# Patient Record
Sex: Male | Born: 1945
Health system: Southern US, Community
[De-identification: ages and names within clinical notes are randomized; demographics above are authoritative.]

## PROBLEM LIST (undated history)

## (undated) DIAGNOSIS — S72002A Fracture of unspecified part of neck of left femur, initial encounter for closed fracture: Secondary | ICD-10-CM

## (undated) DIAGNOSIS — E1122 Type 2 diabetes mellitus with diabetic chronic kidney disease: Secondary | ICD-10-CM

## (undated) DIAGNOSIS — E1149 Type 2 diabetes mellitus with other diabetic neurological complication: Secondary | ICD-10-CM

## (undated) DIAGNOSIS — R351 Nocturia: Secondary | ICD-10-CM

## (undated) DIAGNOSIS — E1139 Type 2 diabetes mellitus with other diabetic ophthalmic complication: Secondary | ICD-10-CM

## (undated) DIAGNOSIS — I429 Cardiomyopathy, unspecified: Secondary | ICD-10-CM

## (undated) DIAGNOSIS — M1990 Unspecified osteoarthritis, unspecified site: Secondary | ICD-10-CM

## (undated) DIAGNOSIS — D509 Iron deficiency anemia, unspecified: Secondary | ICD-10-CM

## (undated) DIAGNOSIS — Z89611 Acquired absence of right leg above knee: Secondary | ICD-10-CM

## (undated) DIAGNOSIS — M159 Polyosteoarthritis, unspecified: Secondary | ICD-10-CM

## (undated) DIAGNOSIS — N401 Enlarged prostate with lower urinary tract symptoms: Secondary | ICD-10-CM

## (undated) DIAGNOSIS — E1151 Type 2 diabetes mellitus with diabetic peripheral angiopathy without gangrene: Secondary | ICD-10-CM

## (undated) DIAGNOSIS — E669 Obesity, unspecified: Secondary | ICD-10-CM

## (undated) DIAGNOSIS — I2510 Atherosclerotic heart disease of native coronary artery without angina pectoris: Secondary | ICD-10-CM

## (undated) DIAGNOSIS — K759 Inflammatory liver disease, unspecified: Secondary | ICD-10-CM

## (undated) DIAGNOSIS — N400 Enlarged prostate without lower urinary tract symptoms: Secondary | ICD-10-CM

## (undated) DIAGNOSIS — K644 Residual hemorrhoidal skin tags: Secondary | ICD-10-CM

## (undated) DIAGNOSIS — E039 Hypothyroidism, unspecified: Secondary | ICD-10-CM

## (undated) DIAGNOSIS — R972 Elevated prostate specific antigen [PSA]: Secondary | ICD-10-CM

## (undated) DIAGNOSIS — E113493 Type 2 diabetes mellitus with severe nonproliferative diabetic retinopathy without macular edema, bilateral: Secondary | ICD-10-CM

## (undated) DIAGNOSIS — E785 Hyperlipidemia, unspecified: Secondary | ICD-10-CM

## (undated) DIAGNOSIS — I10 Essential (primary) hypertension: Secondary | ICD-10-CM

## (undated) DIAGNOSIS — Z992 Dependence on renal dialysis: Secondary | ICD-10-CM

## (undated) DIAGNOSIS — N186 End stage renal disease: Secondary | ICD-10-CM

## (undated) DIAGNOSIS — I119 Hypertensive heart disease without heart failure: Secondary | ICD-10-CM

## (undated) DIAGNOSIS — K648 Other hemorrhoids: Secondary | ICD-10-CM

## (undated) DIAGNOSIS — K5900 Constipation, unspecified: Secondary | ICD-10-CM

## (undated) DIAGNOSIS — N185 Chronic kidney disease, stage 5: Secondary | ICD-10-CM

## (undated) DIAGNOSIS — G546 Phantom limb syndrome with pain: Principal | ICD-10-CM

## (undated) DIAGNOSIS — I4892 Unspecified atrial flutter: Secondary | ICD-10-CM

## (undated) DIAGNOSIS — K219 Gastro-esophageal reflux disease without esophagitis: Secondary | ICD-10-CM

## (undated) DIAGNOSIS — I872 Venous insufficiency (chronic) (peripheral): Secondary | ICD-10-CM

## (undated) DIAGNOSIS — Z9289 Personal history of other medical treatment: Secondary | ICD-10-CM

## (undated) HISTORY — DX: Hypertensive heart disease without heart failure: I11.9

## (undated) HISTORY — DX: Type 2 diabetes mellitus with diabetic peripheral angiopathy without gangrene: E11.51

## (undated) HISTORY — PX: REFRACTIVE SURGERY: SHX103

## (undated) HISTORY — DX: Type 2 diabetes mellitus with other diabetic neurological complication: E11.49

## (undated) HISTORY — PX: LEG AMPUTATION ABOVE KNEE: SHX117

## (undated) HISTORY — DX: Fracture of unspecified part of neck of left femur, initial encounter for closed fracture: S72.002A

## (undated) HISTORY — DX: Benign prostatic hyperplasia with lower urinary tract symptoms: N40.1

## (undated) HISTORY — DX: Cardiomyopathy, unspecified: I42.9

## (undated) HISTORY — DX: Residual hemorrhoidal skin tags: K64.4

## (undated) HISTORY — DX: Acquired absence of right leg above knee: Z89.611

## (undated) HISTORY — DX: End stage renal disease: Z99.2

## (undated) HISTORY — DX: Iron deficiency anemia, unspecified: D50.9

## (undated) HISTORY — DX: Constipation, unspecified: K59.00

## (undated) HISTORY — DX: Venous insufficiency (chronic) (peripheral): I87.2

## (undated) HISTORY — DX: End stage renal disease: N18.6

## (undated) HISTORY — DX: Nocturia: R35.1

## (undated) HISTORY — DX: Benign prostatic hyperplasia without lower urinary tract symptoms: N40.0

## (undated) HISTORY — DX: Type 2 diabetes mellitus with severe nonproliferative diabetic retinopathy without macular edema, bilateral: E11.3493

## (undated) HISTORY — DX: Elevated prostate specific antigen (PSA): R97.20

## (undated) HISTORY — DX: Essential (primary) hypertension: I10

## (undated) HISTORY — DX: Other hemorrhoids: K64.8

## (undated) HISTORY — DX: Type 2 diabetes mellitus with other diabetic ophthalmic complication: E11.39

## (undated) HISTORY — DX: Chronic kidney disease, stage 5: N18.5

## (undated) HISTORY — PX: JOINT REPLACEMENT: SHX530

## (undated) HISTORY — DX: Unspecified osteoarthritis, unspecified site: M19.90

## (undated) HISTORY — PX: PROSTATE BIOPSY: SHX241

## (undated) HISTORY — DX: Unspecified atrial flutter: I48.92

## (undated) HISTORY — DX: Hyperlipidemia, unspecified: E78.5

## (undated) HISTORY — PX: TONSILLECTOMY: SUR1361

## (undated) HISTORY — DX: Phantom limb syndrome with pain: G54.6

## (undated) HISTORY — PX: FRACTURE SURGERY: SHX138

## (undated) HISTORY — DX: Type 2 diabetes mellitus with diabetic chronic kidney disease: E11.22

## (undated) HISTORY — DX: Polyosteoarthritis, unspecified: M15.9

## (undated) HISTORY — DX: Atherosclerotic heart disease of native coronary artery without angina pectoris: I25.10

## (undated) HISTORY — DX: Obesity, unspecified: E66.9

## (undated) HISTORY — PX: CORONARY ANGIOPLASTY WITH STENT PLACEMENT: SHX49

---

## 1988-11-22 HISTORY — PX: PILONIDAL CYST EXCISION: SHX744

## 1997-09-26 ENCOUNTER — Other Ambulatory Visit: Admission: RE | Admit: 1997-09-26 | Discharge: 1997-09-26 | Payer: Self-pay | Admitting: Family Medicine

## 1998-05-04 ENCOUNTER — Encounter: Payer: Self-pay | Admitting: Cardiology

## 1998-05-04 ENCOUNTER — Ambulatory Visit (HOSPITAL_COMMUNITY): Admission: RE | Admit: 1998-05-04 | Discharge: 1998-05-04 | Payer: Self-pay | Admitting: Cardiology

## 1999-05-27 ENCOUNTER — Encounter: Payer: Self-pay | Admitting: Emergency Medicine

## 1999-05-27 ENCOUNTER — Inpatient Hospital Stay (HOSPITAL_COMMUNITY): Admission: EM | Admit: 1999-05-27 | Discharge: 1999-06-02 | Payer: Self-pay | Admitting: Emergency Medicine

## 2001-05-29 ENCOUNTER — Emergency Department (HOSPITAL_COMMUNITY): Admission: EM | Admit: 2001-05-29 | Discharge: 2001-05-29 | Payer: Self-pay | Admitting: Emergency Medicine

## 2001-05-29 ENCOUNTER — Encounter: Payer: Self-pay | Admitting: Emergency Medicine

## 2001-06-03 ENCOUNTER — Emergency Department (HOSPITAL_COMMUNITY): Admission: EM | Admit: 2001-06-03 | Discharge: 2001-06-03 | Payer: Self-pay | Admitting: Emergency Medicine

## 2001-10-12 ENCOUNTER — Encounter: Payer: Self-pay | Admitting: Emergency Medicine

## 2001-10-12 ENCOUNTER — Emergency Department (HOSPITAL_COMMUNITY): Admission: EM | Admit: 2001-10-12 | Discharge: 2001-10-12 | Payer: Self-pay | Admitting: Emergency Medicine

## 2002-12-07 ENCOUNTER — Ambulatory Visit (HOSPITAL_COMMUNITY): Admission: RE | Admit: 2002-12-07 | Discharge: 2002-12-07 | Payer: Self-pay | Admitting: Specialist

## 2002-12-28 ENCOUNTER — Ambulatory Visit (HOSPITAL_COMMUNITY): Admission: RE | Admit: 2002-12-28 | Discharge: 2002-12-28 | Payer: Self-pay | Admitting: Specialist

## 2003-08-01 ENCOUNTER — Inpatient Hospital Stay (HOSPITAL_COMMUNITY): Admission: EM | Admit: 2003-08-01 | Discharge: 2003-08-09 | Payer: Self-pay | Admitting: Emergency Medicine

## 2003-12-08 ENCOUNTER — Ambulatory Visit: Payer: Self-pay | Admitting: Internal Medicine

## 2003-12-18 ENCOUNTER — Ambulatory Visit: Payer: Self-pay | Admitting: Internal Medicine

## 2003-12-21 ENCOUNTER — Ambulatory Visit (HOSPITAL_COMMUNITY): Admission: RE | Admit: 2003-12-21 | Discharge: 2003-12-21 | Payer: Self-pay | Admitting: Vascular Surgery

## 2004-01-23 ENCOUNTER — Ambulatory Visit: Payer: Self-pay | Admitting: Internal Medicine

## 2004-04-23 ENCOUNTER — Ambulatory Visit: Payer: Self-pay | Admitting: Internal Medicine

## 2004-04-23 ENCOUNTER — Inpatient Hospital Stay (HOSPITAL_COMMUNITY): Admission: AD | Admit: 2004-04-23 | Discharge: 2004-04-26 | Payer: Self-pay | Admitting: Internal Medicine

## 2004-04-23 ENCOUNTER — Ambulatory Visit: Payer: Self-pay | Admitting: Cardiovascular Disease

## 2004-04-24 ENCOUNTER — Encounter: Payer: Self-pay | Admitting: Cardiology

## 2004-05-06 ENCOUNTER — Ambulatory Visit: Payer: Self-pay | Admitting: Internal Medicine

## 2004-06-05 ENCOUNTER — Ambulatory Visit: Payer: Self-pay | Admitting: Cardiology

## 2004-06-24 ENCOUNTER — Ambulatory Visit: Payer: Self-pay | Admitting: Internal Medicine

## 2004-07-04 ENCOUNTER — Ambulatory Visit: Payer: Self-pay | Admitting: Internal Medicine

## 2004-08-30 ENCOUNTER — Ambulatory Visit: Payer: Self-pay | Admitting: Internal Medicine

## 2004-09-06 ENCOUNTER — Ambulatory Visit: Payer: Self-pay | Admitting: Internal Medicine

## 2004-10-10 ENCOUNTER — Ambulatory Visit: Payer: Self-pay | Admitting: Cardiology

## 2004-10-17 ENCOUNTER — Ambulatory Visit: Payer: Self-pay | Admitting: Internal Medicine

## 2004-10-25 ENCOUNTER — Ambulatory Visit: Payer: Self-pay | Admitting: Cardiology

## 2004-10-25 ENCOUNTER — Ambulatory Visit: Payer: Self-pay

## 2004-11-27 ENCOUNTER — Ambulatory Visit: Payer: Self-pay | Admitting: Internal Medicine

## 2004-12-04 ENCOUNTER — Ambulatory Visit: Payer: Self-pay | Admitting: Internal Medicine

## 2004-12-05 ENCOUNTER — Inpatient Hospital Stay (HOSPITAL_COMMUNITY): Admission: RE | Admit: 2004-12-05 | Discharge: 2004-12-09 | Payer: Self-pay | Admitting: Orthopedic Surgery

## 2004-12-05 ENCOUNTER — Encounter (INDEPENDENT_AMBULATORY_CARE_PROVIDER_SITE_OTHER): Payer: Self-pay | Admitting: Specialist

## 2005-01-10 ENCOUNTER — Ambulatory Visit (HOSPITAL_COMMUNITY): Admission: RE | Admit: 2005-01-10 | Discharge: 2005-01-10 | Payer: Self-pay | Admitting: Orthopedic Surgery

## 2005-01-24 ENCOUNTER — Inpatient Hospital Stay (HOSPITAL_COMMUNITY): Admission: RE | Admit: 2005-01-24 | Discharge: 2005-01-27 | Payer: Self-pay | Admitting: Orthopedic Surgery

## 2005-01-24 ENCOUNTER — Encounter (INDEPENDENT_AMBULATORY_CARE_PROVIDER_SITE_OTHER): Payer: Self-pay | Admitting: *Deleted

## 2005-04-14 ENCOUNTER — Encounter: Admission: RE | Admit: 2005-04-14 | Discharge: 2005-07-13 | Payer: Self-pay | Admitting: Orthopedic Surgery

## 2005-04-16 ENCOUNTER — Ambulatory Visit: Payer: Self-pay | Admitting: Internal Medicine

## 2005-04-28 ENCOUNTER — Ambulatory Visit: Payer: Self-pay | Admitting: Internal Medicine

## 2005-05-23 ENCOUNTER — Ambulatory Visit: Payer: Self-pay | Admitting: Cardiology

## 2005-05-30 ENCOUNTER — Ambulatory Visit: Payer: Self-pay | Admitting: Cardiology

## 2005-06-11 ENCOUNTER — Ambulatory Visit: Payer: Self-pay | Admitting: Internal Medicine

## 2005-07-14 ENCOUNTER — Encounter: Admission: RE | Admit: 2005-07-14 | Discharge: 2005-08-07 | Payer: Self-pay | Admitting: Orthopedic Surgery

## 2005-11-28 ENCOUNTER — Ambulatory Visit: Payer: Self-pay | Admitting: Cardiology

## 2005-12-24 ENCOUNTER — Ambulatory Visit: Payer: Self-pay | Admitting: Internal Medicine

## 2005-12-24 ENCOUNTER — Ambulatory Visit (HOSPITAL_COMMUNITY): Admission: RE | Admit: 2005-12-24 | Discharge: 2005-12-24 | Payer: Self-pay | Admitting: Internal Medicine

## 2006-01-19 ENCOUNTER — Ambulatory Visit: Payer: Self-pay | Admitting: Internal Medicine

## 2006-01-19 ENCOUNTER — Encounter (INDEPENDENT_AMBULATORY_CARE_PROVIDER_SITE_OTHER): Payer: Self-pay | Admitting: Internal Medicine

## 2006-01-19 LAB — CONVERTED CEMR LAB
ALT: 22 units/L (ref 0–40)
AST: 22 units/L (ref 0–37)
Albumin: 2.7 g/dL — ABNORMAL LOW (ref 3.5–5.2)
Alkaline Phosphatase: 83 units/L (ref 39–117)
CO2: 26 meq/L (ref 19–32)
Calcium: 8.7 mg/dL (ref 8.4–10.5)
Creatinine, Ser: 1 mg/dL (ref 0.40–1.50)
Sodium: 141 meq/L (ref 135–145)
Total Bilirubin: 0.4 mg/dL (ref 0.3–1.2)
Total Protein: 6.4 g/dL (ref 6.0–8.3)

## 2006-02-17 ENCOUNTER — Encounter (INDEPENDENT_AMBULATORY_CARE_PROVIDER_SITE_OTHER): Payer: Self-pay | Admitting: Internal Medicine

## 2006-02-18 ENCOUNTER — Encounter (INDEPENDENT_AMBULATORY_CARE_PROVIDER_SITE_OTHER): Payer: Self-pay | Admitting: Internal Medicine

## 2006-02-18 ENCOUNTER — Ambulatory Visit: Payer: Self-pay | Admitting: Hospitalist

## 2006-02-18 LAB — CONVERTED CEMR LAB
Bacteria, UA: NONE SEEN
Bilirubin Urine: NEGATIVE
Ketones, ur: NEGATIVE mg/dL
Specific Gravity, Urine: 1.017 (ref 1.005–1.03)
Urine Glucose: NEGATIVE mg/dL
Urobilinogen, UA: 1 (ref 0.0–1.0)

## 2006-02-19 ENCOUNTER — Encounter (INDEPENDENT_AMBULATORY_CARE_PROVIDER_SITE_OTHER): Payer: Self-pay | Admitting: Internal Medicine

## 2006-03-06 ENCOUNTER — Ambulatory Visit: Payer: Self-pay | Admitting: Hospitalist

## 2006-03-06 ENCOUNTER — Ambulatory Visit: Payer: Self-pay | Admitting: Cardiology

## 2006-03-06 ENCOUNTER — Inpatient Hospital Stay (HOSPITAL_COMMUNITY): Admission: AD | Admit: 2006-03-06 | Discharge: 2006-03-12 | Payer: Self-pay | Admitting: Hospitalist

## 2006-03-06 ENCOUNTER — Ambulatory Visit: Payer: Self-pay | Admitting: Internal Medicine

## 2006-03-11 ENCOUNTER — Encounter: Payer: Self-pay | Admitting: Cardiology

## 2006-03-11 ENCOUNTER — Encounter: Payer: Self-pay | Admitting: Vascular Surgery

## 2006-04-02 ENCOUNTER — Encounter (INDEPENDENT_AMBULATORY_CARE_PROVIDER_SITE_OTHER): Payer: Self-pay | Admitting: Unknown Physician Specialty

## 2006-04-02 ENCOUNTER — Ambulatory Visit: Payer: Self-pay | Admitting: Hospitalist

## 2006-04-02 DIAGNOSIS — E1139 Type 2 diabetes mellitus with other diabetic ophthalmic complication: Secondary | ICD-10-CM

## 2006-04-02 DIAGNOSIS — I25118 Atherosclerotic heart disease of native coronary artery with other forms of angina pectoris: Secondary | ICD-10-CM

## 2006-04-02 DIAGNOSIS — I2510 Atherosclerotic heart disease of native coronary artery without angina pectoris: Secondary | ICD-10-CM

## 2006-04-02 DIAGNOSIS — E1149 Type 2 diabetes mellitus with other diabetic neurological complication: Secondary | ICD-10-CM

## 2006-04-02 DIAGNOSIS — E785 Hyperlipidemia, unspecified: Secondary | ICD-10-CM

## 2006-04-02 HISTORY — DX: Type 2 diabetes mellitus with other diabetic neurological complication: E11.49

## 2006-04-02 HISTORY — DX: Hyperlipidemia, unspecified: E78.5

## 2006-04-02 HISTORY — DX: Atherosclerotic heart disease of native coronary artery without angina pectoris: I25.10

## 2006-04-02 HISTORY — DX: Type 2 diabetes mellitus with other diabetic ophthalmic complication: E11.39

## 2006-04-02 LAB — CONVERTED CEMR LAB
BUN: 27 mg/dL — ABNORMAL HIGH (ref 6–23)
CO2: 26 meq/L (ref 19–32)
Chloride: 110 meq/L (ref 96–112)
Glucose, Bld: 160 mg/dL — ABNORMAL HIGH (ref 70–99)
Hemoglobin: 10.5 g/dL — ABNORMAL LOW (ref 13.0–17.0)
MCV: 73.8 fL — ABNORMAL LOW (ref 78.0–100.0)
Platelets: 144 10*3/uL — ABNORMAL LOW (ref 150–400)
RDW: 13.4 % (ref 11.5–14.0)
WBC: 7 10*3/uL (ref 4.0–10.5)

## 2006-04-10 ENCOUNTER — Telehealth (INDEPENDENT_AMBULATORY_CARE_PROVIDER_SITE_OTHER): Payer: Self-pay | Admitting: Hospitalist

## 2006-04-13 ENCOUNTER — Telehealth: Payer: Self-pay | Admitting: *Deleted

## 2006-05-27 ENCOUNTER — Ambulatory Visit: Payer: Self-pay | Admitting: Internal Medicine

## 2006-05-27 ENCOUNTER — Ambulatory Visit (HOSPITAL_COMMUNITY): Admission: RE | Admit: 2006-05-27 | Discharge: 2006-05-27 | Payer: Self-pay | Admitting: *Deleted

## 2006-05-27 ENCOUNTER — Encounter (INDEPENDENT_AMBULATORY_CARE_PROVIDER_SITE_OTHER): Payer: Self-pay | Admitting: Ophthalmology

## 2006-05-27 DIAGNOSIS — E1169 Type 2 diabetes mellitus with other specified complication: Secondary | ICD-10-CM

## 2006-05-27 DIAGNOSIS — N185 Chronic kidney disease, stage 5: Secondary | ICD-10-CM

## 2006-05-27 DIAGNOSIS — E1151 Type 2 diabetes mellitus with diabetic peripheral angiopathy without gangrene: Secondary | ICD-10-CM

## 2006-05-27 DIAGNOSIS — D509 Iron deficiency anemia, unspecified: Secondary | ICD-10-CM

## 2006-05-27 DIAGNOSIS — D631 Anemia in chronic kidney disease: Secondary | ICD-10-CM | POA: Insufficient documentation

## 2006-05-27 HISTORY — DX: Iron deficiency anemia, unspecified: D50.9

## 2006-05-27 HISTORY — DX: Type 2 diabetes mellitus with diabetic peripheral angiopathy without gangrene: E11.51

## 2006-05-27 LAB — CONVERTED CEMR LAB
Blood Glucose, Fingerstick: 160
Hgb A1c MFr Bld: 8.5 %

## 2006-05-28 ENCOUNTER — Encounter (INDEPENDENT_AMBULATORY_CARE_PROVIDER_SITE_OTHER): Payer: Self-pay | Admitting: Ophthalmology

## 2006-06-10 ENCOUNTER — Ambulatory Visit: Payer: Self-pay | Admitting: Hospitalist

## 2006-06-10 ENCOUNTER — Encounter (INDEPENDENT_AMBULATORY_CARE_PROVIDER_SITE_OTHER): Payer: Self-pay | Admitting: Internal Medicine

## 2006-06-10 LAB — CONVERTED CEMR LAB
Blood Glucose, AC Bkfst: 154 mg/dL
Cholesterol: 101 mg/dL (ref 0–200)
HCT: 34.7 % — ABNORMAL LOW (ref 39.0–52.0)
Hemoglobin: 11.1 g/dL — ABNORMAL LOW (ref 13.0–17.0)
LDL Cholesterol: 36 mg/dL (ref 0–99)
Platelets: 203 10*3/uL (ref 150–400)
RDW: 15 % — ABNORMAL HIGH (ref 11.5–14.0)
VLDL: 38 mg/dL (ref 0–40)
WBC: 7.6 10*3/uL (ref 4.0–10.5)

## 2006-06-22 ENCOUNTER — Ambulatory Visit: Payer: Self-pay | Admitting: Internal Medicine

## 2006-06-22 LAB — CONVERTED CEMR LAB: Blood Glucose, Fingerstick: 184

## 2006-07-07 ENCOUNTER — Ambulatory Visit: Payer: Self-pay | Admitting: Hospitalist

## 2006-07-07 ENCOUNTER — Encounter (INDEPENDENT_AMBULATORY_CARE_PROVIDER_SITE_OTHER): Payer: Self-pay | Admitting: Internal Medicine

## 2006-07-07 DIAGNOSIS — N401 Enlarged prostate with lower urinary tract symptoms: Secondary | ICD-10-CM

## 2006-07-07 DIAGNOSIS — L738 Other specified follicular disorders: Secondary | ICD-10-CM

## 2006-07-07 DIAGNOSIS — R351 Nocturia: Secondary | ICD-10-CM

## 2006-07-07 HISTORY — DX: Benign prostatic hyperplasia with lower urinary tract symptoms: N40.1

## 2006-07-09 ENCOUNTER — Ambulatory Visit: Payer: Self-pay | Admitting: Internal Medicine

## 2006-07-09 ENCOUNTER — Telehealth (INDEPENDENT_AMBULATORY_CARE_PROVIDER_SITE_OTHER): Payer: Self-pay | Admitting: Internal Medicine

## 2006-07-09 ENCOUNTER — Encounter (INDEPENDENT_AMBULATORY_CARE_PROVIDER_SITE_OTHER): Payer: Self-pay | Admitting: *Deleted

## 2006-07-09 ENCOUNTER — Encounter (INDEPENDENT_AMBULATORY_CARE_PROVIDER_SITE_OTHER): Payer: Self-pay | Admitting: Internal Medicine

## 2006-07-10 ENCOUNTER — Encounter (INDEPENDENT_AMBULATORY_CARE_PROVIDER_SITE_OTHER): Payer: Self-pay | Admitting: Internal Medicine

## 2006-07-10 LAB — CONVERTED CEMR LAB
Albumin ELP: 42.4 % — ABNORMAL LOW (ref 55.8–66.1)
Alpha-1-Globulin: 5 % — ABNORMAL HIGH (ref 2.9–4.9)
Alpha-2-Globulin: 13.2 % — ABNORMAL HIGH (ref 7.1–11.8)
Basophils Absolute: 0 10*3/uL (ref 0.0–0.1)
Basophils Relative: 0 % (ref 0–1)
Beta Globulin: 7.4 % — ABNORMAL HIGH (ref 4.7–7.2)
Creatinine, Urine: 103 mg/dL
Eosinophils Absolute: 0.1 10*3/uL (ref 0.0–0.7)
Eosinophils Relative: 2 % (ref 0–5)
Gamma Globulin: 24.3 % — ABNORMAL HIGH (ref 11.1–18.8)
HCT: 34.3 % — ABNORMAL LOW (ref 39.0–52.0)
Hemoglobin: 10.9 g/dL — ABNORMAL LOW (ref 13.0–17.0)
Iron: 34 ug/dL — ABNORMAL LOW (ref 42–165)
Leukocytes, UA: NEGATIVE
Lymphocytes Relative: 24 % (ref 12–46)
Lymphs Abs: 2 10*3/uL (ref 0.7–3.3)
Microalb Creat Ratio: 729.1 mg/g — ABNORMAL HIGH (ref 0.0–30.0)
Nitrite: NEGATIVE
RBC / HPF: NONE SEEN (ref <3)
RBC: 4.73 M/uL (ref 4.22–5.81)
Retic Ct Pct: 1 % (ref 0.4–3.1)
UIBC: 279 ug/dL
Urobilinogen, UA: 1 (ref 0.0–1.0)
WBC, UA: NONE SEEN cells/hpf (ref <3)

## 2006-07-13 ENCOUNTER — Encounter (INDEPENDENT_AMBULATORY_CARE_PROVIDER_SITE_OTHER): Payer: Self-pay | Admitting: *Deleted

## 2006-07-14 ENCOUNTER — Encounter (INDEPENDENT_AMBULATORY_CARE_PROVIDER_SITE_OTHER): Payer: Self-pay | Admitting: *Deleted

## 2006-07-14 ENCOUNTER — Encounter (HOSPITAL_COMMUNITY): Admission: RE | Admit: 2006-07-14 | Discharge: 2006-09-21 | Payer: Self-pay | Admitting: Surgery

## 2006-07-14 ENCOUNTER — Encounter (HOSPITAL_BASED_OUTPATIENT_CLINIC_OR_DEPARTMENT_OTHER): Admission: RE | Admit: 2006-07-14 | Discharge: 2006-08-14 | Payer: Self-pay | Admitting: Surgery

## 2006-07-15 ENCOUNTER — Telehealth (INDEPENDENT_AMBULATORY_CARE_PROVIDER_SITE_OTHER): Payer: Self-pay | Admitting: Internal Medicine

## 2006-07-16 ENCOUNTER — Encounter (INDEPENDENT_AMBULATORY_CARE_PROVIDER_SITE_OTHER): Payer: Self-pay | Admitting: *Deleted

## 2006-07-19 ENCOUNTER — Encounter (INDEPENDENT_AMBULATORY_CARE_PROVIDER_SITE_OTHER): Payer: Self-pay | Admitting: Internal Medicine

## 2006-07-21 ENCOUNTER — Ambulatory Visit: Payer: Self-pay | Admitting: Hospitalist

## 2006-07-21 ENCOUNTER — Encounter (INDEPENDENT_AMBULATORY_CARE_PROVIDER_SITE_OTHER): Payer: Self-pay | Admitting: Internal Medicine

## 2006-07-21 DIAGNOSIS — K219 Gastro-esophageal reflux disease without esophagitis: Secondary | ICD-10-CM | POA: Insufficient documentation

## 2006-07-21 LAB — CONVERTED CEMR LAB: Blood Glucose, Fingerstick: 205

## 2006-07-29 ENCOUNTER — Encounter (INDEPENDENT_AMBULATORY_CARE_PROVIDER_SITE_OTHER): Payer: Self-pay | Admitting: Internal Medicine

## 2006-08-03 LAB — CONVERTED CEMR LAB: IgA: 750 mg/dL — ABNORMAL HIGH (ref 68–378)

## 2006-08-14 ENCOUNTER — Encounter (HOSPITAL_BASED_OUTPATIENT_CLINIC_OR_DEPARTMENT_OTHER): Admission: RE | Admit: 2006-08-14 | Discharge: 2006-09-05 | Payer: Self-pay | Admitting: Surgery

## 2006-08-19 ENCOUNTER — Encounter (INDEPENDENT_AMBULATORY_CARE_PROVIDER_SITE_OTHER): Payer: Self-pay | Admitting: *Deleted

## 2006-08-20 ENCOUNTER — Ambulatory Visit: Payer: Self-pay | Admitting: Internal Medicine

## 2006-08-20 DIAGNOSIS — E1159 Type 2 diabetes mellitus with other circulatory complications: Secondary | ICD-10-CM

## 2006-08-20 DIAGNOSIS — N521 Erectile dysfunction due to diseases classified elsewhere: Secondary | ICD-10-CM

## 2006-08-20 LAB — CONVERTED CEMR LAB
Blood Glucose, Fingerstick: 222
Hgb A1c MFr Bld: 7.8 %

## 2006-09-07 ENCOUNTER — Encounter (HOSPITAL_BASED_OUTPATIENT_CLINIC_OR_DEPARTMENT_OTHER): Admission: RE | Admit: 2006-09-07 | Discharge: 2006-09-21 | Payer: Self-pay | Admitting: Surgery

## 2006-09-22 ENCOUNTER — Encounter (HOSPITAL_BASED_OUTPATIENT_CLINIC_OR_DEPARTMENT_OTHER): Admission: RE | Admit: 2006-09-22 | Discharge: 2006-10-15 | Payer: Self-pay | Admitting: Surgery

## 2006-10-06 ENCOUNTER — Encounter (INDEPENDENT_AMBULATORY_CARE_PROVIDER_SITE_OTHER): Payer: Self-pay | Admitting: *Deleted

## 2006-10-07 ENCOUNTER — Telehealth (INDEPENDENT_AMBULATORY_CARE_PROVIDER_SITE_OTHER): Payer: Self-pay | Admitting: *Deleted

## 2006-10-08 ENCOUNTER — Telehealth (INDEPENDENT_AMBULATORY_CARE_PROVIDER_SITE_OTHER): Payer: Self-pay | Admitting: *Deleted

## 2006-10-12 ENCOUNTER — Ambulatory Visit: Payer: Self-pay | Admitting: Cardiology

## 2006-10-12 LAB — CONVERTED CEMR LAB
Albumin: 2.8 g/dL — ABNORMAL LOW (ref 3.5–5.2)
CO2: 22 meq/L (ref 19–32)
Calcium: 8.6 mg/dL (ref 8.4–10.5)
Cholesterol: 96 mg/dL (ref 0–200)
Glucose, Bld: 145 mg/dL — ABNORMAL HIGH (ref 70–99)
HDL: 19.5 mg/dL — ABNORMAL LOW (ref >39.0)
Potassium: 4.3 meq/L (ref 3.5–5.1)
Total Bilirubin: 0.5 mg/dL (ref 0.3–1.2)
Total CHOL/HDL Ratio: 4.9
Total Protein: 7.3 g/dL (ref 6.0–8.3)

## 2006-10-15 ENCOUNTER — Encounter (HOSPITAL_BASED_OUTPATIENT_CLINIC_OR_DEPARTMENT_OTHER): Admission: RE | Admit: 2006-10-15 | Discharge: 2006-11-26 | Payer: Self-pay | Admitting: Surgery

## 2006-10-22 ENCOUNTER — Telehealth (INDEPENDENT_AMBULATORY_CARE_PROVIDER_SITE_OTHER): Payer: Self-pay | Admitting: *Deleted

## 2006-11-03 ENCOUNTER — Ambulatory Visit: Payer: Self-pay | Admitting: Cardiology

## 2006-11-03 LAB — CONVERTED CEMR LAB
BUN: 37 mg/dL — ABNORMAL HIGH (ref 6–23)
CO2: 24 meq/L (ref 19–32)
Calcium: 8.2 mg/dL — ABNORMAL LOW (ref 8.4–10.5)
GFR calc Af Amer: 66 mL/min
GFR calc non Af Amer: 54 mL/min

## 2006-11-13 ENCOUNTER — Ambulatory Visit: Payer: Self-pay | Admitting: Internal Medicine

## 2006-11-13 ENCOUNTER — Encounter (INDEPENDENT_AMBULATORY_CARE_PROVIDER_SITE_OTHER): Payer: Self-pay | Admitting: *Deleted

## 2006-11-13 LAB — CONVERTED CEMR LAB: Blood Glucose, Fingerstick: 143

## 2006-11-14 ENCOUNTER — Telehealth: Payer: Self-pay | Admitting: *Deleted

## 2006-11-14 DIAGNOSIS — E875 Hyperkalemia: Secondary | ICD-10-CM

## 2006-11-14 LAB — CONVERTED CEMR LAB
Creatinine, Ser: 1.69 mg/dL — ABNORMAL HIGH (ref 0.40–1.50)
Sodium: 135 meq/L (ref 135–145)

## 2006-11-16 ENCOUNTER — Telehealth (INDEPENDENT_AMBULATORY_CARE_PROVIDER_SITE_OTHER): Payer: Self-pay | Admitting: *Deleted

## 2006-11-16 ENCOUNTER — Ambulatory Visit: Payer: Self-pay | Admitting: Internal Medicine

## 2006-11-16 LAB — CONVERTED CEMR LAB
Calcium: 8.9 mg/dL (ref 8.4–10.5)
Glucose, Bld: 120 mg/dL — ABNORMAL HIGH (ref 70–99)
Potassium: 5.5 meq/L — ABNORMAL HIGH (ref 3.5–5.3)

## 2006-11-17 ENCOUNTER — Encounter (INDEPENDENT_AMBULATORY_CARE_PROVIDER_SITE_OTHER): Payer: Self-pay | Admitting: Internal Medicine

## 2006-11-17 ENCOUNTER — Ambulatory Visit: Payer: Self-pay | Admitting: Internal Medicine

## 2006-11-17 ENCOUNTER — Encounter (INDEPENDENT_AMBULATORY_CARE_PROVIDER_SITE_OTHER): Payer: Self-pay | Admitting: *Deleted

## 2006-11-17 LAB — CONVERTED CEMR LAB
Blood Glucose, Fingerstick: 164
Calcium: 8.8 mg/dL (ref 8.4–10.5)
Glucose, Bld: 129 mg/dL — ABNORMAL HIGH (ref 70–99)
Hgb A1c MFr Bld: 6.4 %

## 2006-11-18 ENCOUNTER — Encounter (INDEPENDENT_AMBULATORY_CARE_PROVIDER_SITE_OTHER): Payer: Self-pay | Admitting: *Deleted

## 2006-11-30 ENCOUNTER — Encounter (HOSPITAL_BASED_OUTPATIENT_CLINIC_OR_DEPARTMENT_OTHER): Admission: RE | Admit: 2006-11-30 | Discharge: 2006-12-22 | Payer: Self-pay | Admitting: Surgery

## 2006-12-02 ENCOUNTER — Encounter (INDEPENDENT_AMBULATORY_CARE_PROVIDER_SITE_OTHER): Payer: Self-pay | Admitting: Internal Medicine

## 2006-12-02 ENCOUNTER — Ambulatory Visit: Payer: Self-pay | Admitting: Internal Medicine

## 2006-12-02 LAB — CONVERTED CEMR LAB
BUN: 36 mg/dL — ABNORMAL HIGH (ref 6–23)
Calcium: 8.5 mg/dL (ref 8.4–10.5)
Chloride: 114 meq/L — ABNORMAL HIGH (ref 96–112)
Glucose, Bld: 99 mg/dL (ref 70–99)
Potassium: 4.5 meq/L (ref 3.5–5.3)

## 2006-12-06 ENCOUNTER — Encounter (INDEPENDENT_AMBULATORY_CARE_PROVIDER_SITE_OTHER): Payer: Self-pay | Admitting: *Deleted

## 2006-12-09 ENCOUNTER — Ambulatory Visit: Payer: Self-pay | Admitting: Internal Medicine

## 2006-12-09 DIAGNOSIS — I119 Hypertensive heart disease without heart failure: Secondary | ICD-10-CM

## 2006-12-09 DIAGNOSIS — I10 Essential (primary) hypertension: Secondary | ICD-10-CM

## 2006-12-09 HISTORY — DX: Hypertensive heart disease without heart failure: I11.9

## 2006-12-09 LAB — CONVERTED CEMR LAB
Calcium: 8.3 mg/dL — ABNORMAL LOW (ref 8.4–10.5)
Creatinine, Ser: 1.25 mg/dL (ref 0.40–1.50)

## 2006-12-15 ENCOUNTER — Telehealth (INDEPENDENT_AMBULATORY_CARE_PROVIDER_SITE_OTHER): Payer: Self-pay | Admitting: *Deleted

## 2006-12-15 ENCOUNTER — Ambulatory Visit: Payer: Self-pay | Admitting: Internal Medicine

## 2006-12-15 LAB — CONVERTED CEMR LAB
CO2: 20 meq/L (ref 19–32)
Chloride: 111 meq/L (ref 96–112)
Glucose, Bld: 122 mg/dL — ABNORMAL HIGH (ref 70–99)

## 2006-12-16 ENCOUNTER — Ambulatory Visit: Payer: Self-pay

## 2006-12-16 ENCOUNTER — Encounter (INDEPENDENT_AMBULATORY_CARE_PROVIDER_SITE_OTHER): Payer: Self-pay | Admitting: Internal Medicine

## 2006-12-18 ENCOUNTER — Encounter (INDEPENDENT_AMBULATORY_CARE_PROVIDER_SITE_OTHER): Payer: Self-pay | Admitting: *Deleted

## 2007-01-05 ENCOUNTER — Ambulatory Visit: Payer: Self-pay | Admitting: Internal Medicine

## 2007-01-05 LAB — CONVERTED CEMR LAB
BUN: 27 mg/dL — ABNORMAL HIGH (ref 6–23)
Chloride: 109 meq/L (ref 96–112)
Glucose, Bld: 97 mg/dL (ref 70–99)
Potassium: 4.5 meq/L (ref 3.5–5.3)

## 2007-02-02 ENCOUNTER — Ambulatory Visit: Payer: Self-pay | Admitting: Internal Medicine

## 2007-02-02 DIAGNOSIS — M159 Polyosteoarthritis, unspecified: Secondary | ICD-10-CM

## 2007-02-02 HISTORY — DX: Polyosteoarthritis, unspecified: M15.9

## 2007-02-04 ENCOUNTER — Encounter (INDEPENDENT_AMBULATORY_CARE_PROVIDER_SITE_OTHER): Payer: Self-pay | Admitting: *Deleted

## 2007-04-01 ENCOUNTER — Telehealth: Payer: Self-pay | Admitting: Internal Medicine

## 2007-05-04 ENCOUNTER — Ambulatory Visit: Payer: Self-pay | Admitting: Internal Medicine

## 2007-05-04 LAB — CONVERTED CEMR LAB
Albumin: 3.5 g/dL (ref 3.5–5.2)
Alkaline Phosphatase: 73 units/L (ref 39–117)
BUN: 30 mg/dL — ABNORMAL HIGH (ref 6–23)
Glucose, Bld: 86 mg/dL (ref 70–99)
HDL: 28 mg/dL — ABNORMAL LOW (ref >39)
LDL Cholesterol: 30 mg/dL (ref 0–99)
Potassium: 4.2 meq/L (ref 3.5–5.3)
Total Bilirubin: 0.5 mg/dL (ref 0.3–1.2)
Triglycerides: 193 mg/dL — ABNORMAL HIGH (ref <150)

## 2007-05-05 ENCOUNTER — Ambulatory Visit: Payer: Self-pay | Admitting: Cardiology

## 2007-05-12 ENCOUNTER — Telehealth: Payer: Self-pay | Admitting: *Deleted

## 2007-05-31 ENCOUNTER — Telehealth (INDEPENDENT_AMBULATORY_CARE_PROVIDER_SITE_OTHER): Payer: Self-pay | Admitting: *Deleted

## 2007-05-31 ENCOUNTER — Encounter (INDEPENDENT_AMBULATORY_CARE_PROVIDER_SITE_OTHER): Payer: Self-pay | Admitting: Internal Medicine

## 2007-06-16 ENCOUNTER — Encounter (INDEPENDENT_AMBULATORY_CARE_PROVIDER_SITE_OTHER): Payer: Self-pay | Admitting: Internal Medicine

## 2007-06-16 ENCOUNTER — Ambulatory Visit: Payer: Self-pay | Admitting: Hospitalist

## 2007-06-16 DIAGNOSIS — R109 Unspecified abdominal pain: Secondary | ICD-10-CM

## 2007-06-16 LAB — CONVERTED CEMR LAB
Albumin: 3.6 g/dL (ref 3.5–5.2)
CO2: 21 meq/L (ref 19–32)
Calcium: 8.7 mg/dL (ref 8.4–10.5)
Chloride: 107 meq/L (ref 96–112)
Glucose, Bld: 60 mg/dL — ABNORMAL LOW (ref 70–99)
Leukocytes, UA: NEGATIVE
Nitrite: NEGATIVE
Protein, ur: NEGATIVE mg/dL
Sodium: 141 meq/L (ref 135–145)
Total Bilirubin: 0.4 mg/dL (ref 0.3–1.2)
Total Protein: 7.5 g/dL (ref 6.0–8.3)
Urine Glucose: NEGATIVE mg/dL
Urobilinogen, UA: 0.2 (ref 0.0–1.0)

## 2007-06-18 ENCOUNTER — Encounter (INDEPENDENT_AMBULATORY_CARE_PROVIDER_SITE_OTHER): Payer: Self-pay | Admitting: Internal Medicine

## 2007-06-18 ENCOUNTER — Telehealth (INDEPENDENT_AMBULATORY_CARE_PROVIDER_SITE_OTHER): Payer: Self-pay | Admitting: *Deleted

## 2007-06-23 ENCOUNTER — Telehealth: Payer: Self-pay | Admitting: *Deleted

## 2007-07-01 ENCOUNTER — Telehealth (INDEPENDENT_AMBULATORY_CARE_PROVIDER_SITE_OTHER): Payer: Self-pay | Admitting: Internal Medicine

## 2007-09-07 ENCOUNTER — Ambulatory Visit: Payer: Self-pay | Admitting: Internal Medicine

## 2007-09-07 DIAGNOSIS — G4700 Insomnia, unspecified: Secondary | ICD-10-CM | POA: Insufficient documentation

## 2007-09-07 LAB — CONVERTED CEMR LAB: Hgb A1c MFr Bld: 6.8 %

## 2007-09-29 ENCOUNTER — Ambulatory Visit: Payer: Self-pay | Admitting: *Deleted

## 2007-09-29 ENCOUNTER — Encounter (INDEPENDENT_AMBULATORY_CARE_PROVIDER_SITE_OTHER): Payer: Self-pay | Admitting: Internal Medicine

## 2007-09-29 LAB — CONVERTED CEMR LAB
Alkaline Phosphatase: 70 units/L (ref 39–117)
BUN: 32 mg/dL — ABNORMAL HIGH (ref 6–23)
CO2: 22 meq/L (ref 19–32)
Glucose, Bld: 88 mg/dL (ref 70–99)
Total Bilirubin: 0.4 mg/dL (ref 0.3–1.2)

## 2007-10-26 ENCOUNTER — Telehealth: Payer: Self-pay | Admitting: *Deleted

## 2007-11-10 ENCOUNTER — Telehealth: Payer: Self-pay | Admitting: *Deleted

## 2007-11-22 ENCOUNTER — Telehealth (INDEPENDENT_AMBULATORY_CARE_PROVIDER_SITE_OTHER): Payer: Self-pay | Admitting: Internal Medicine

## 2007-11-22 ENCOUNTER — Encounter (HOSPITAL_BASED_OUTPATIENT_CLINIC_OR_DEPARTMENT_OTHER): Admission: RE | Admit: 2007-11-22 | Discharge: 2007-12-10 | Payer: Self-pay | Admitting: Internal Medicine

## 2007-11-25 ENCOUNTER — Encounter (INDEPENDENT_AMBULATORY_CARE_PROVIDER_SITE_OTHER): Payer: Self-pay | Admitting: Internal Medicine

## 2007-11-30 ENCOUNTER — Encounter (INDEPENDENT_AMBULATORY_CARE_PROVIDER_SITE_OTHER): Payer: Self-pay | Admitting: Internal Medicine

## 2007-12-01 ENCOUNTER — Ambulatory Visit: Payer: Self-pay | Admitting: *Deleted

## 2007-12-01 ENCOUNTER — Encounter (INDEPENDENT_AMBULATORY_CARE_PROVIDER_SITE_OTHER): Payer: Self-pay | Admitting: Internal Medicine

## 2007-12-01 LAB — CONVERTED CEMR LAB
BUN: 37 mg/dL — ABNORMAL HIGH (ref 6–23)
Chloride: 109 meq/L (ref 96–112)
Glucose, Bld: 121 mg/dL — ABNORMAL HIGH (ref 70–99)
Potassium: 4.1 meq/L (ref 3.5–5.3)
Sodium: 141 meq/L (ref 135–145)

## 2007-12-07 ENCOUNTER — Ambulatory Visit: Payer: Self-pay | Admitting: Internal Medicine

## 2007-12-07 LAB — CONVERTED CEMR LAB: Blood Glucose, Fingerstick: 78

## 2007-12-13 LAB — CONVERTED CEMR LAB
BUN: 40 mg/dL — ABNORMAL HIGH (ref 6–23)
CO2: 22 meq/L (ref 19–32)
Chloride: 109 meq/L (ref 96–112)
Creatinine, Ser: 1.5 mg/dL (ref 0.40–1.50)
Potassium: 4.1 meq/L (ref 3.5–5.3)

## 2007-12-15 ENCOUNTER — Ambulatory Visit: Payer: Self-pay | Admitting: Internal Medicine

## 2007-12-21 LAB — CONVERTED CEMR LAB
BUN: 43 mg/dL — ABNORMAL HIGH (ref 6–23)
Chloride: 111 meq/L (ref 96–112)
Creatinine, Ser: 1.35 mg/dL (ref 0.40–1.50)
Glucose, Bld: 85 mg/dL (ref 70–99)
Potassium: 4.3 meq/L (ref 3.5–5.3)

## 2008-01-07 ENCOUNTER — Telehealth (INDEPENDENT_AMBULATORY_CARE_PROVIDER_SITE_OTHER): Payer: Self-pay | Admitting: Pharmacy Technician

## 2008-01-10 ENCOUNTER — Ambulatory Visit: Payer: Self-pay | Admitting: Internal Medicine

## 2008-01-12 LAB — CONVERTED CEMR LAB
BUN: 29 mg/dL — ABNORMAL HIGH (ref 6–23)
Calcium: 8.8 mg/dL (ref 8.4–10.5)
Glucose, Bld: 99 mg/dL (ref 70–99)
Potassium: 4.3 meq/L (ref 3.5–5.3)

## 2008-01-19 ENCOUNTER — Ambulatory Visit: Payer: Self-pay | Admitting: Cardiology

## 2008-02-08 ENCOUNTER — Encounter (INDEPENDENT_AMBULATORY_CARE_PROVIDER_SITE_OTHER): Payer: Self-pay | Admitting: Internal Medicine

## 2008-02-08 ENCOUNTER — Ambulatory Visit: Payer: Self-pay

## 2008-02-08 ENCOUNTER — Ambulatory Visit: Payer: Self-pay | Admitting: Cardiology

## 2008-02-08 LAB — CONVERTED CEMR LAB
Bilirubin, Direct: 0.1 mg/dL (ref 0.0–0.3)
Calcium: 8.7 mg/dL (ref 8.4–10.5)
GFR calc Af Amer: 87 mL/min
GFR calc non Af Amer: 72 mL/min
HDL: 25.2 mg/dL — ABNORMAL LOW (ref >39.0)
Potassium: 4.1 meq/L (ref 3.5–5.1)
Sodium: 142 meq/L (ref 135–145)
Total Bilirubin: 0.6 mg/dL (ref 0.3–1.2)
Total CHOL/HDL Ratio: 3.5
VLDL: 24 mg/dL (ref 0–40)

## 2008-02-10 ENCOUNTER — Encounter (INDEPENDENT_AMBULATORY_CARE_PROVIDER_SITE_OTHER): Payer: Self-pay | Admitting: Internal Medicine

## 2008-02-24 ENCOUNTER — Ambulatory Visit: Payer: Self-pay | Admitting: Cardiology

## 2008-02-24 ENCOUNTER — Telehealth: Payer: Self-pay | Admitting: *Deleted

## 2008-02-24 LAB — CONVERTED CEMR LAB
BUN: 46 mg/dL — ABNORMAL HIGH (ref 6–23)
CO2: 24 meq/L (ref 19–32)
Chloride: 115 meq/L — ABNORMAL HIGH (ref 96–112)
GFR calc non Af Amer: 59 mL/min
Glucose, Bld: 167 mg/dL — ABNORMAL HIGH (ref 70–99)
Potassium: 4.4 meq/L (ref 3.5–5.1)

## 2008-03-07 ENCOUNTER — Ambulatory Visit: Payer: Self-pay | Admitting: Cardiology

## 2008-03-07 LAB — CONVERTED CEMR LAB
BUN: 51 mg/dL — ABNORMAL HIGH (ref 6–23)
CO2: 25 meq/L (ref 19–32)
Chloride: 110 meq/L (ref 96–112)
Creatinine, Ser: 1.7 mg/dL — ABNORMAL HIGH (ref 0.4–1.5)
Glucose, Bld: 145 mg/dL — ABNORMAL HIGH (ref 70–99)

## 2008-03-28 ENCOUNTER — Ambulatory Visit: Payer: Self-pay | Admitting: Internal Medicine

## 2008-03-28 LAB — CONVERTED CEMR LAB: Hgb A1c MFr Bld: 6.7 %

## 2008-03-29 ENCOUNTER — Telehealth (INDEPENDENT_AMBULATORY_CARE_PROVIDER_SITE_OTHER): Payer: Self-pay | Admitting: Internal Medicine

## 2008-03-29 LAB — CONVERTED CEMR LAB
BUN: 35 mg/dL — ABNORMAL HIGH (ref 6–23)
CO2: 21 meq/L (ref 19–32)
Calcium: 8.5 mg/dL (ref 8.4–10.5)
Chloride: 109 meq/L (ref 96–112)
Creatinine, Ser: 1.16 mg/dL (ref 0.40–1.50)
Glucose, Bld: 96 mg/dL (ref 70–99)
Potassium: 4.5 meq/L (ref 3.5–5.3)
Sodium: 140 meq/L (ref 135–145)

## 2008-04-12 ENCOUNTER — Ambulatory Visit: Payer: Self-pay | Admitting: Internal Medicine

## 2008-04-14 ENCOUNTER — Ambulatory Visit: Payer: Self-pay | Admitting: Internal Medicine

## 2008-04-14 LAB — CONVERTED CEMR LAB
BUN: 48 mg/dL — ABNORMAL HIGH (ref 6–23)
BUN: 37 mg/dL — ABNORMAL HIGH (ref 6–23)
Calcium: 8.8 mg/dL (ref 8.4–10.5)
Chloride: 111 meq/L (ref 96–112)
Glucose, Bld: 86 mg/dL (ref 70–99)
Glucose, Bld: 71 mg/dL (ref 70–99)
Potassium: 4.5 meq/L (ref 3.5–5.3)
Sodium: 141 meq/L (ref 135–145)

## 2008-04-19 ENCOUNTER — Emergency Department (HOSPITAL_COMMUNITY): Admission: EM | Admit: 2008-04-19 | Discharge: 2008-04-19 | Payer: Self-pay | Admitting: Emergency Medicine

## 2008-04-24 ENCOUNTER — Encounter (INDEPENDENT_AMBULATORY_CARE_PROVIDER_SITE_OTHER): Payer: Self-pay | Admitting: Internal Medicine

## 2008-08-07 ENCOUNTER — Encounter (INDEPENDENT_AMBULATORY_CARE_PROVIDER_SITE_OTHER): Payer: Self-pay | Admitting: *Deleted

## 2008-09-05 ENCOUNTER — Telehealth: Payer: Self-pay | Admitting: *Deleted

## 2008-09-19 ENCOUNTER — Ambulatory Visit: Payer: Self-pay | Admitting: Cardiology

## 2008-09-19 LAB — CONVERTED CEMR LAB: LDL Cholesterol: 42 mg/dL

## 2008-09-20 ENCOUNTER — Encounter (INDEPENDENT_AMBULATORY_CARE_PROVIDER_SITE_OTHER): Payer: Self-pay | Admitting: *Deleted

## 2008-09-26 ENCOUNTER — Telehealth: Payer: Self-pay | Admitting: *Deleted

## 2008-10-12 ENCOUNTER — Encounter: Payer: Self-pay | Admitting: Internal Medicine

## 2008-10-12 ENCOUNTER — Ambulatory Visit: Payer: Self-pay | Admitting: Infectious Diseases

## 2008-10-12 LAB — CONVERTED CEMR LAB
ALT: 28 units/L (ref 0–53)
AST: 31 units/L (ref 0–37)
Albumin: 3.6 g/dL (ref 3.5–5.2)
Alkaline Phosphatase: 57 units/L (ref 39–117)
BUN: 46 mg/dL — ABNORMAL HIGH (ref 6–23)
Blood Glucose, Fingerstick: 95
Calcium: 8.8 mg/dL (ref 8.4–10.5)
Chloride: 110 meq/L (ref 96–112)
Potassium: 5 meq/L (ref 3.5–5.3)
Sodium: 138 meq/L (ref 135–145)
Total Protein: 7.4 g/dL (ref 6.0–8.3)

## 2008-11-07 ENCOUNTER — Ambulatory Visit: Payer: Self-pay | Admitting: Internal Medicine

## 2008-11-13 LAB — CONVERTED CEMR LAB
CO2: 19 meq/L (ref 19–32)
Calcium: 9 mg/dL (ref 8.4–10.5)
Chloride: 112 meq/L (ref 96–112)
MCV: 73.8 fL — ABNORMAL LOW (ref >=78.0)
Platelets: 130 10*3/uL — ABNORMAL LOW (ref 150–400)
Potassium: 4.4 meq/L (ref 3.5–5.3)
RBC: 4.66 M/uL (ref 4.22–5.81)
Sodium: 140 meq/L (ref 135–145)
WBC: 7.6 10*3/uL (ref 4.0–10.5)

## 2008-11-14 ENCOUNTER — Encounter (INDEPENDENT_AMBULATORY_CARE_PROVIDER_SITE_OTHER): Payer: Self-pay | Admitting: Internal Medicine

## 2008-11-15 ENCOUNTER — Ambulatory Visit: Payer: Self-pay | Admitting: Internal Medicine

## 2008-11-16 ENCOUNTER — Encounter (HOSPITAL_BASED_OUTPATIENT_CLINIC_OR_DEPARTMENT_OTHER): Admission: RE | Admit: 2008-11-16 | Discharge: 2009-02-14 | Payer: Self-pay | Admitting: Internal Medicine

## 2008-11-17 LAB — CONVERTED CEMR LAB
CO2: 22 meq/L (ref 19–32)
Chloride: 111 meq/L (ref 96–112)
Creatinine, Ser: 1.15 mg/dL (ref 0.40–1.50)
Ferritin: 144 ng/mL (ref 22–322)
Glucose, Bld: 78 mg/dL (ref 70–99)

## 2009-01-12 ENCOUNTER — Ambulatory Visit: Payer: Self-pay | Admitting: Internal Medicine

## 2009-01-12 ENCOUNTER — Encounter: Payer: Self-pay | Admitting: Internal Medicine

## 2009-01-13 ENCOUNTER — Encounter (INDEPENDENT_AMBULATORY_CARE_PROVIDER_SITE_OTHER): Payer: Self-pay | Admitting: Internal Medicine

## 2009-01-13 LAB — CONVERTED CEMR LAB
Creatinine, Urine: 112 mg/dL
Microalb Creat Ratio: 147 mg/g — ABNORMAL HIGH (ref 0.0–30.0)
Microalb, Ur: 16.46 mg/dL — ABNORMAL HIGH (ref 0.00–1.89)

## 2009-04-16 ENCOUNTER — Telehealth: Payer: Self-pay | Admitting: Internal Medicine

## 2009-04-17 ENCOUNTER — Telehealth: Payer: Self-pay | Admitting: Internal Medicine

## 2009-04-30 ENCOUNTER — Telehealth: Payer: Self-pay | Admitting: Internal Medicine

## 2009-05-25 ENCOUNTER — Ambulatory Visit: Payer: Self-pay | Admitting: Internal Medicine

## 2009-05-25 DIAGNOSIS — L851 Acquired keratosis [keratoderma] palmaris et plantaris: Secondary | ICD-10-CM | POA: Insufficient documentation

## 2009-05-25 DIAGNOSIS — K5900 Constipation, unspecified: Secondary | ICD-10-CM

## 2009-05-25 DIAGNOSIS — J302 Other seasonal allergic rhinitis: Secondary | ICD-10-CM

## 2009-05-25 HISTORY — DX: Constipation, unspecified: K59.00

## 2009-05-25 LAB — CONVERTED CEMR LAB
BUN: 29 mg/dL — ABNORMAL HIGH (ref 6–23)
Blood Glucose, Fingerstick: 130
CO2: 24 meq/L (ref 19–32)
Calcium: 8.3 mg/dL — ABNORMAL LOW (ref 8.4–10.5)
Chloride: 108 meq/L (ref 96–112)
Creatinine, Ser: 1.22 mg/dL (ref 0.40–1.50)

## 2009-06-08 ENCOUNTER — Encounter: Payer: Self-pay | Admitting: Internal Medicine

## 2009-06-15 ENCOUNTER — Ambulatory Visit: Payer: Self-pay | Admitting: Cardiology

## 2009-08-08 ENCOUNTER — Ambulatory Visit: Payer: Self-pay | Admitting: Infectious Disease

## 2009-08-08 ENCOUNTER — Ambulatory Visit (HOSPITAL_COMMUNITY): Admission: RE | Admit: 2009-08-08 | Discharge: 2009-08-08 | Payer: Self-pay | Admitting: Internal Medicine

## 2009-08-08 ENCOUNTER — Telehealth: Payer: Self-pay | Admitting: *Deleted

## 2009-08-08 LAB — CONVERTED CEMR LAB
AST: 31 units/L (ref 0–37)
Albumin: 3.6 g/dL (ref 3.5–5.2)
BUN: 38 mg/dL — ABNORMAL HIGH (ref 6–23)
Calcium: 8.5 mg/dL (ref 8.4–10.5)
Chloride: 111 meq/L (ref 96–112)
Eosinophils Relative: 3 % (ref 0–5)
Glucose, Bld: 187 mg/dL — ABNORMAL HIGH (ref 70–99)
HCT: 35.1 % — ABNORMAL LOW (ref 39.0–52.0)
HDL: 23 mg/dL — ABNORMAL LOW (ref >39)
Hemoglobin: 11.2 g/dL — ABNORMAL LOW (ref 13.0–17.0)
Lymphocytes Relative: 26 % (ref 12–46)
Lymphs Abs: 1.9 10*3/uL (ref 0.7–4.0)
Monocytes Absolute: 0.5 10*3/uL (ref 0.1–1.0)
Monocytes Relative: 7 % (ref 3–12)
Potassium: 5.1 meq/L (ref 3.5–5.3)
Triglycerides: 243 mg/dL — ABNORMAL HIGH (ref <150)
WBC: 7.3 10*3/uL (ref 4.0–10.5)

## 2009-08-21 ENCOUNTER — Telehealth: Payer: Self-pay | Admitting: Internal Medicine

## 2009-10-12 ENCOUNTER — Ambulatory Visit: Payer: Self-pay | Admitting: Internal Medicine

## 2009-10-12 ENCOUNTER — Telehealth: Payer: Self-pay | Admitting: Internal Medicine

## 2009-10-12 LAB — CONVERTED CEMR LAB
AST: 29 units/L (ref 0–37)
Albumin: 3.8 g/dL (ref 3.5–5.2)
BUN: 27 mg/dL — ABNORMAL HIGH (ref 6–23)
Calcium: 9.6 mg/dL (ref 8.4–10.5)
Chloride: 106 meq/L (ref 96–112)
Hgb A1c MFr Bld: 7.2 %
Ketones, ur: NEGATIVE mg/dL
Nitrite: POSITIVE — AB
Potassium: 5.2 meq/L (ref 3.5–5.3)
Protein, ur: 100 mg/dL — AB
Specific Gravity, Urine: 1.016 (ref 1.005–1.0)
Total Protein: 7.8 g/dL (ref 6.0–8.3)
Urobilinogen, UA: 1 (ref 0.0–1.0)

## 2009-10-22 ENCOUNTER — Telehealth: Payer: Self-pay | Admitting: Internal Medicine

## 2009-11-02 ENCOUNTER — Telehealth: Payer: Self-pay | Admitting: Internal Medicine

## 2010-03-07 ENCOUNTER — Encounter: Payer: Self-pay | Admitting: Cardiology

## 2010-03-07 ENCOUNTER — Ambulatory Visit: Payer: Self-pay | Admitting: Cardiology

## 2010-03-12 ENCOUNTER — Telehealth: Payer: Self-pay | Admitting: Internal Medicine

## 2010-03-14 ENCOUNTER — Telehealth (INDEPENDENT_AMBULATORY_CARE_PROVIDER_SITE_OTHER): Payer: Self-pay | Admitting: Radiology

## 2010-03-19 ENCOUNTER — Encounter: Payer: Self-pay | Admitting: Cardiology

## 2010-03-19 ENCOUNTER — Ambulatory Visit: Payer: Self-pay

## 2010-03-19 ENCOUNTER — Encounter (HOSPITAL_COMMUNITY)
Admission: RE | Admit: 2010-03-19 | Discharge: 2010-04-23 | Payer: Self-pay | Source: Home / Self Care | Attending: Cardiology | Admitting: Cardiology

## 2010-04-12 ENCOUNTER — Encounter: Payer: Self-pay | Admitting: Internal Medicine

## 2010-04-12 ENCOUNTER — Ambulatory Visit: Admission: RE | Admit: 2010-04-12 | Discharge: 2010-04-12 | Payer: Self-pay | Source: Home / Self Care

## 2010-04-12 DIAGNOSIS — I872 Venous insufficiency (chronic) (peripheral): Secondary | ICD-10-CM

## 2010-04-12 HISTORY — DX: Venous insufficiency (chronic) (peripheral): I87.2

## 2010-04-12 LAB — CONVERTED CEMR LAB
Albumin: 3.3 g/dL — ABNORMAL LOW (ref 3.5–5.2)
BUN: 28 mg/dL — ABNORMAL HIGH (ref 6–23)
Calcium: 8.4 mg/dL (ref 8.4–10.5)
Chloride: 111 meq/L (ref 96–112)
Glucose, Bld: 108 mg/dL — ABNORMAL HIGH (ref 70–99)
Potassium: 4.6 meq/L (ref 3.5–5.3)
Sodium: 141 meq/L (ref 135–145)
Total Protein: 7 g/dL (ref 6.0–8.3)

## 2010-04-15 LAB — GLUCOSE, CAPILLARY: Glucose-Capillary: 137 mg/dL — ABNORMAL HIGH (ref 70–99)

## 2010-04-19 ENCOUNTER — Ambulatory Visit: Admission: RE | Admit: 2010-04-19 | Discharge: 2010-04-19 | Payer: Self-pay | Source: Home / Self Care

## 2010-04-19 LAB — CONVERTED CEMR LAB
BUN: 38 mg/dL — ABNORMAL HIGH (ref 6–23)
Chloride: 108 meq/L (ref 96–112)
Creatinine, Ser: 1.42 mg/dL (ref 0.40–1.50)
Potassium: 4.6 meq/L (ref 3.5–5.3)

## 2010-04-21 LAB — CONVERTED CEMR LAB
ALT: 33 units/L (ref 0–53)
AST: 36 units/L (ref 0–37)
Albumin: 3.2 g/dL — ABNORMAL LOW (ref 3.5–5.2)
Alkaline Phosphatase: 59 units/L (ref 39–117)
Cholesterol: 100 mg/dL (ref 0–200)
Total Protein: 7.4 g/dL (ref 6.0–8.3)

## 2010-04-22 ENCOUNTER — Telehealth: Payer: Self-pay | Admitting: *Deleted

## 2010-04-25 NOTE — Progress Notes (Signed)
Summary: refill/ hla  Phone Note Refill Request Message from:  Fax from Pharmacy on April 17, 2009 1:57 PM  Refills Requested: Medication #1:  NOVOLOG MIX 70/30 FLEXPEN 70-30 % SUSP Inject 38 units in the morning and 26 units in the evening as directed   Last Refilled: 12/6 Initial call taken by: Marin Roberts RN,  April 17, 2009 1:57 PM  Follow-up for Phone Call        Please schedule with Dr. Logan Bores at the next available non-overbook appointment.  Thank You. Follow-up by: Doneen Poisson MD,  April 17, 2009 2:10 PM  Additional Follow-up for Phone Call Additional follow up Details #1::        Flag to [facility].  Call from pharmacy will increse to 2 boxes instead of 1 due to the amountthat the pt is using daily. Additional Follow-up by: Angelina Ok RN,  April 20, 2009 2:30 PM    Prescriptions: NOVOLOG MIX 70/30 FLEXPEN 70-30 % SUSP (INSULIN ASP PROT & ASP (HUM)) Inject 38 units in the morning and 26 units in the evening as directed  #1 box x 5   Entered and Authorized by:   Doneen Poisson MD   Signed by:   Doneen Poisson MD on 04/17/2009   Method used:   Faxed to ...       [facility]) (mail-order)       [address]       [place], [place]  [address]       Ph: [phone]       Fax: [phone]   RxID:   [id number]

## 2010-04-25 NOTE — Assessment & Plan Note (Signed)
Summary: 9 month rov/sl    Primary Provider:  Olene Craven MD   History of Present Illness: [name] [name] is a very pleasant  gentleman, has a history of coronary disease status post PCI of his right coronary artery with drug- eluting stent in February 2006.  His most recent Myoview in November, 2009, showed no ischemia or infarction.  His ejection fraction was 65%. An echocardiogram was also performed in December 2007, that showed normal LV function. An abdominal ultrasound in August 2006 showed no aneurysm. Carotid Dopplers in August of 2006 showed probable 0-39% stenosis. I last saw him in June 2010. Since then he is doing reasonably well. He denies any dyspnea, orthopnea or increased pedal edema. There's been no syncope. He does state that he occasionally has chest pain. It is in the left breast area and described as a sharp pain. It does not radiate. It is not related to exertion. There is no associated nausea, shortness of breath or diaphoresis. It lasts for seconds and resolves spontaneously. Note he does state that it improved with using his upper extremities.  Current Medications (verified): 1)  Pravachol 20 Mg Tabs (Pravastatin Sodium) .... Take 1 Tablet By Mouth Once A Day 2)  Plavix 75 Mg Tabs (Clopidogrel Bisulfate) .... Take 1 Tablet By Mouth Once A Day 3)  Accu-Chek Compact Test Drum  Strp (Glucose Blood) 4)  Novolog Mix 70/30 Flexpen 70-30 % Susp (Insulin Asp Prot & Asp (Hum)) .... Inject 38 Units in The Morning and 26 Units in The Evening As Directed 5)  Pen Needles 5/16" 31g X 8 Mm Misc (Insulin Pen Needle) .... To Take Insulin Twice Daily 6)  Hytrin 10 Mg  Caps (Terazosin Hcl) .... Take 1 Tablet By Mouth At Bedtime 7)  Norvasc 10 Mg Tabs (Amlodipine Besylate) .... Take 1 Tablet By Mouth Once A Day 8)  Coreg 12.5 Mg  Tabs (Carvedilol) .... Take 1 Tablet By Mouth Two Times A Day 9)  Lisinopril 10 Mg Tabs (Lisinopril) .... Take 1 Tablet By Mouth Once A Day 10)  Famotidine 40 Mg  Tabs (Famotidine) .... Take 1 Tablet By Mouth At Bedtime 11)  Benadryl 25 Mg Tabs (Diphenhydramine Hcl) .... As Needed 12)  Fluticasone Propionate 50 Mcg/act Susp (Fluticasone Propionate) .... Use One Spray in Each Nostril Twice A Day. 13)  Loratadine 10 Mg Tabs (Loratadine) .... Take 1 Pill By Mouth Once Daily As Needed For Allergy Symptoms  Allergies: 1)  Flomax  Past History:  Past Medical History: Current Problems:  HYPERTENSION, BENIGN ESSENTIAL (ICD-401.1) PERIPHERAL VASCULAR DISEASE (ICD-443.9)s/p R. 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 Above knee amputation-right PTCA/stent Colon polypectomy-Nov 07 Rectal polyp   Left heart catheterization with coronary angiography and left ventriculography.Percutaneous coronary intervention with high-speed rotational atherectomy followed by placement of two bare metal stents in the mid  right coronary. Temporary pacemaker placement right in the right ventricle.RESULTS:  Hemodynamics:  Left ventricular pressure 140/12, aortic pressure 140/82. There is no aortic  valve gradient. Left ventriculogram:  There is very mild akinesis of anterolateral wall as well as the  inferior apical wall. Otherwise wall motion is normal. Ejection fraction estimated at 60%. There is trace mitral regurgitation. MWP/MEDQ  D:  04/24/2004  T:  04/24/2004  Job:  [id number] cc:   Olga Millers, M.D. [facility]  Social History: Reviewed history from 03/28/2008 and no changes required. Married to 2nd wife. 1st wife died of cerebral aneurysm. 5 children (all by 1st wife) Former Smoker Hx of alcohol abuse (sober x 20 yrs) Retired Arboriculturist from [employer]  Review of Systems       Mild pedal edema in left lower extremity but no fevers or chills, productive cough, hemoptysis, dysphasia, odynophagia, melena, hematochezia, dysuria, hematuria, rash, seizure activity, orthopnea, PND,  claudication. Remaining systems are negative.   Vital Signs:  Patient profile:   65 year old male Height:      68 inches Weight:      191 pounds BMI:     29.15 Pulse rate:   74 / minute Resp:     14 per minute BP sitting:   130 / 80  (left arm)  Vitals Entered By: Kem Parkinson (June 15, 2009 8:35 AM)  Physical Exam  General:  Well-developed well-nourished in no acute distress.  Skin is warm and dry.  HEENT is normal.  Neck is supple. No thyromegaly.  Chest is clear to auscultation with normal expansion.  Cardiovascular exam is regular rate and rhythm.  Abdominal exam nontender or distended. No masses palpated. Extremities - Status post right AKA. Trace edema on the left. neuro grossly intact    EKG  Procedure date:  06/15/2009  Findings:      Normal sinus rhythm at a rate of 74. Right bundle branch block. Left anterior sixth rib block.  Impression & Recommendations:  Problem # 1:  RENAL INSUFFICIENCY (ICD-588.9) Monitored by primary care.  Problem # 2:  HYPERTENSION, BENIGN ESSENTIAL (ICD-401.1) Blood pressure controlled on present medications. Will continue. His updated medication list for this problem includes:    Hytrin 10 Mg Caps (Terazosin hcl) .[name]... Take 1 tablet  by mouth at bedtime    Norvasc 10 Mg Tabs (Amlodipine besylate) .[name]... Take 1 tablet by mouth once a day    Coreg 12.5 Mg Tabs (Carvedilol) .[name]... Take 1 tablet by mouth two times a day    Lisinopril 10 Mg Tabs (Lisinopril) .[name]... Take 1 tablet by mouth once a day  Problem # 3:  HYPERLIPIDEMIA (ICD-272.4) Continue statin. Lipids and liver monitored by primary care. His updated medication list for this problem includes:    Pravachol 20 Mg Tabs (Pravastatin sodium) .[name]... Take 1 tablet by mouth once a day  Problem # 4:  CORONARY ARTERY DISEASE (ICD-414.00)  Continue Plavix, ACE inhibitor, beta blocker and statin. His updated medication list for this problem includes:    Plavix 75 Mg Tabs (Clopidogrel bisulfate) .[name]... Take 1 tablet by mouth once a day    Norvasc 10 Mg Tabs (Amlodipine besylate) .[name]... Take 1 tablet by mouth once a day    Coreg 12.5 Mg Tabs (Carvedilol) .[name]... Take 1 tablet by mouth two times a day    Lisinopril 10 Mg Tabs (Lisinopril) .[name]... Take 1 tablet by mouth once a day  Orders: EKG w/ Interpretation (93000)  Problem # 5:  CHEST PAIN (ICD-786.50) Symptoms atypical and not likely to be cardiac. I will plan a Myoview in 9 months when he returns. His updated medication  list for this problem includes:    Plavix 75 Mg Tabs (Clopidogrel bisulfate) .[name]... Take 1 tablet by mouth once a day    Norvasc 10 Mg Tabs (Amlodipine besylate) .[name]... Take 1 tablet by mouth once a day    Coreg 12.5 Mg Tabs (Carvedilol) .[name]... Take 1 tablet by mouth two times a day    Lisinopril 10 Mg Tabs (Lisinopril) .[name]... Take 1 tablet by mouth once a day  Problem # 6:  GERD (ICD-530.81)  His updated medication list for this problem includes:    Famotidine 40 Mg Tabs (Famotidine) .[name]... Take 1 tablet by mouth at bedtime  His updated medication list for this problem includes:    Famotidine 40 Mg Tabs (Famotidine) .[name]... Take 1 tablet by mouth at bedtime  Problem # 7:  DIABETES MELLITUS, TYPE II  (ICD-250.00)  His updated medication list for this problem includes:    Novolog Mix 70/30 Flexpen 70-30 % Susp (Insulin asp prot & asp (hum)) ..... Inject 38 units in the morning and 26 units in the evening as directed    Lisinopril 10 Mg Tabs (Lisinopril) .[name]... Take 1 tablet by mouth once a day  His updated medication list for this problem includes:    Novolog Mix 70/30 Flexpen 70-30 % Susp (Insulin asp prot & asp (hum)) ..... Inject 38 units in the morning and 26 units in the evening as directed    Lisinopril 10 Mg Tabs (Lisinopril) .[name]... Take 1 tablet by mouth once a day  Patient Instructions: 1)  Your physician recommends that you schedule a follow-up appointment in: 9 months 2)  Your physician recommends that you continue on your current medications as directed. Please refer to the Current Medication list given to you today.

## 2010-04-25 NOTE — Progress Notes (Signed)
Summary: med refill/gp  Phone Note Refill Request Message from:  Fax from Pharmacy on Aug 21, 2009 9:54 AM  Refills Requested: Medication #1:  PEN NEEDLES 5/16" 31G X 8 MM MISC to take insulin twice daily  Method Requested: Electronic Initial call taken by: Chinita Pester RN,  Aug 21, 2009 9:54 AM  Follow-up for Phone Call        Rx electronically submitted  to pharmacy Follow-up by: Nelda Bucks DO,  August 22, 2009 11:58 AM    Prescriptions: PEN NEEDLES 5/16" 31G X 8 MM MISC (INSULIN PEN NEEDLE) to take insulin twice daily  #1 box x 11   Entered and Authorized by:   Nelda Bucks DO   Signed by:   Nelda Bucks DO on 08/22/2009   Method used:   Electronically to        [facility] [phone]* (retail)       [address] [place], [place]  [address]       Ph: [phone] or [phone]       Fax: [phone]   RxID:   [id number]

## 2010-04-25 NOTE — Progress Notes (Signed)
Summary: nuc pre-procedure  Phone Note Outgoing Call   Call placed by: Domenic Polite, CNMT,  March 14, 2010 10:53 AM Call placed to: Patient Reason for Call: Confirm/change Appt Summary of Call: Reviewed information on Myoview Information Sheet (see scanned document for further details).  Spoke with patient.      Nuclear Med Background Indications for Stress Test: Evaluation for Ischemia, Stent Patency   History: Echo, Heart Catheterization, Myocardial Infarction, Myocardial Perfusion Study, Stents  History Comments: MI-inf. ; '06 cath / stent RCA ; '07 Echo-nml ; '08/'09 MPI - nml     Nuclear Pre-Procedure Cardiac Risk Factors: Carotid Disease, CVA, History of Smoking, Hypertension, IDDM Type 2, Lipids, RBBB Height (in): 68 Tech Comments: carotid dz. mild 0-40%

## 2010-04-25 NOTE — Progress Notes (Signed)
Summary: med refills/gp  Phone Note Refill Request Message from:  Patient's wife on April 30, 2009 2:50 PM  Refills Requested: Medication #1:  LISINOPRIL 10 MG TABS Take 1 tablet by mouth once a day Last appt. 01/12/09;  next appt. 05/25/08 with Dr. Arvilla Market.   Method Requested: Electronic Initial call taken by: Chinita Pester RN,  April 30, 2009 2:50 PM  Follow-up for Phone Call        Refilled electronically.  Follow-up by: Margarito Liner MD,  April 30, 2009 3:15 PM    Prescriptions: LISINOPRIL 10 MG TABS (LISINOPRIL) Take 1 tablet by mouth once a day  #30 x 2   Entered and Authorized by:   Margarito Liner MD   Signed by:   Margarito Liner MD on 04/30/2009   Method used:   Electronically to        [facility] (retail)       [address]       [place], [place]  [address]       Ph: [phone]       Fax: [phone]   RxID:   [id number]

## 2010-04-25 NOTE — Assessment & Plan Note (Signed)
Summary: Cardiology Nuclear Testing  Nuclear Med Background Indications for Stress Test: Evaluation for Ischemia, Stent Patency   History: Echo, Heart Catheterization, Myocardial Infarction, Myocardial Perfusion Study, Stents  History Comments: MI-inf. ; '06 cath / stent RCA ; '07 Echo-nml ; '08/'09 MPI - nml     Nuclear Pre-Procedure Cardiac Risk Factors: Carotid Disease, CVA, History of Smoking, Hypertension, IDDM Type 2, Lipids, PVD, RBBB Caffeine/Decaff Intake: none NPO After: 5:30 PM IV 0.9% NS with Angio Cath: 20g     IV Site: R Wrist IV Started by: Cathlyn Parsons, RN Chest Size (in) 44     Height (in): 68 Weight (lb): 218 BMI: 33.27 Tech Comments: Coreg held x 16hrs. Patient BS at home this am 113 and no insulin.  Nuclear Med Study 1 or 2 day study:  1 day     Stress Test Type:  Eugenie Birks Reading MD:  Willa Rough, MD     Referring MD:  Dr. Olga Millers Resting Radionuclide:  Technetium 2m Tetrofosmin     Resting Radionuclide Dose:  11 mCi  Stress Radionuclide:  Technetium 9m Tetrofosmin     Stress Radionuclide Dose:  33 mCi   Stress Protocol   Lexiscan: 0.4 mg   Stress Test Technologist:  Stanton Kidney, EMT-P     Nuclear Technologist:  Doyne Keel, CNMT  Rest Procedure  Myocardial perfusion imaging was performed at rest 45 minutes following the intravenous administration of Technetium 45m Tetrofosmin.  Stress Procedure  The patient received IV Lexiscan 0.4 mg over 15-seconds.  Technetium 48m Tetrofosmin injected at 30-seconds.  There were no significant changes with infusion.  Quantitative spect images were obtained after a 45 minute delay.Frequent PAC's at baseline, chest tightness during infusion, and rare PVC/symptom free during post infusion.  QPS Raw Data Images:  Patient motion noted; appropriate software correction applied. Stress Images:  Normal homogeneous uptake in all areas of the myocardium. Rest Images:  Normal homogeneous uptake in all areas of  the myocardium. Subtraction (SDS):  No evidence of ischemia. Transient Ischemic Dilatation:  1.00  (Normal <1.22)  Lung/Heart Ratio:  0.33  (Normal <0.45)  Quantitative Gated Spect Images QGS EDV:  108 ml QGS ESV:  41 ml QGS EF:  62 % QGS cine images:  Normal motion  Findings Normal nuclear study      Overall Impression  Exercise Capacity: Lexiscan with no exercise. BP Response: Normal blood pressure response. Clinical Symptoms: chest tight ECG Impression: No significant ST segment change suggestive of ischemia. Overall Impression: Normal stress nuclear study.  Appended Document: Cardiology Nuclear Testing ok  Appended Document: Cardiology Nuclear Testing pt aware of results

## 2010-04-25 NOTE — Miscellaneous (Signed)
Summary: [facility]  [facility]   Imported By: Florinda Marker 06/13/2009 11:08:56  _____________________________________________________________________  External Attachment:    Type:   Image     Comment:   External Document

## 2010-04-25 NOTE — Progress Notes (Signed)
Summary: med refill/gp  Phone Note Refill Request Message from:  Fax from Pharmacy on Aug 21, 2009 1:54 PM  Refills Requested: Medication #1:  PEN NEEDLES 5/16" 31G X 8 MM MISC to take insulin twice daily  Method Requested: Telephone to Pharmacy Initial call taken by: Chinita Pester RN,  Aug 21, 2009 1:54 PM  Follow-up for Phone Call        rx already submitted electronically

## 2010-04-25 NOTE — Assessment & Plan Note (Signed)
Summary: EST-CK/FU/MEDS/CFB   Vital Signs:  Patient profile:   65 year old male Height:      68 inches Temp:     97.4 degrees F oral Pulse rate:   65 / minute BP sitting:   149 / 83  (right arm)  Vitals Entered By: Filomena Jungling NT II (May 25, 2009 3:48 PM) CC: check-up Is Patient Diabetic? Yes Did you bring your meter with you today? Yes Pain Assessment Patient in pain? no      CBG Result 130  Does patient need assistance? Functional Status Self care Ambulation Wheelchair   Primary Care Provider:  Nelda Bucks DO  CC:  check-up.  History of Present Illness: [name] [name] is a 65 yo man who is in today for follow up of his multiple medical problems. 1. DM - Taking 70/30 insulin as directed and  testing  ~2/day. Denies any hypoglycemic/hyperglycemic episodes.  Does not have his meter/CBG log with him today for review. 2. HTN - Taking meds as directed.  Denies H/A, visual changes, C/P, syncope, and dizziness. 3. Hyperlipidemia - Taking statin without side effects. 4. Renal insufficiency - Pt has CRF with hx of ARF secondary to BPH. Pt now on ACE and taking as directed.  5. BPH - To have bx by Dr Brunilda Payor 11/09/08.  6. PVD, s/p R. AKA - Doing well with new prosthesis. 7. CAD - Followed by Dr Jens Som. Negative myoview 11/09.  8.  New complaint of very dry skin "all over."  Denies any ulcerated areas, rash, erythema, induration, areas of weeping/purulent drainage.  Reports skin sometimes itches but is never painful.  He is interested in a good lotion to help with this problem. 9. New complaint of constipation/hard stools.  Pt reports this is an occasional problem for him.  He sometimes uses Milk of Mag to help with symptomatic relief but does not like to use this more than 1-2 times/month.  He denies abdominal pain, changes in stool habits/caliber, dark black stool, and bloody stool/BRBPR.     Preventive Screening-Counseling & Management  Alcohol-Tobacco     Smoking Status:  quit     Year Quit: 1988  Caffeine-Diet-Exercise     Does Patient Exercise: no  Current Problems (verified): 1)  Melena  (ICD-578.1) 2)  Renal Insufficiency  (ICD-588.9) 3)  Hypertension, Benign Essential  (ICD-401.1) 4)  Peripheral Vascular Disease  (ICD-443.9) 5)  Cerebrovascular Accident, Hx of  (ICD-V12.50) 6)  Hyperlipidemia  (ICD-272.4) 7)  Coronary Artery Disease  (ICD-414.00) 8)  Chest Pain  (ICD-786.50) 9)  Flank Pain, Right  (ICD-789.09) 10)  Encounter For Long-term Use of Other Medications  (ICD-V58.69) 11)  Degenerative Joint Disease, Fingers  (ICD-715.94) 12)  Renal Failure, Acute  (ICD-584.9) 13)  Hyperkalemia  (ICD-276.7) 14)  Erectile Dysfunction, Organic  (ICD-607.84) 15)  Gerd  (ICD-530.81) 16)  Hypergammaglobulinemia, 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 Take 1 Tablet By Mouth Once A Day 2)  Plavix 75 Mg Tabs (  Clopidogrel Bisulfate) .... Take 1 Tablet By Mouth Once A Day 3)  Accu-Chek Compact Test Drum  Strp (Glucose Blood) 4)  Novolog Mix 70/30 Flexpen 70-30 % Susp (Insulin Asp Prot & Asp (Hum)) .... Inject 38 Units in The Morning and 26 Units in The Evening As Directed 5)  Pen Needles 5/16" 31g X 8 Mm Misc (Insulin Pen Needle) .... To Take Insulin Twice Daily 6)  Hytrin 10 Mg  Caps (Terazosin Hcl) .... Take 1 Tablet By Mouth At Bedtime 7)  Norvasc 10 Mg Tabs (Amlodipine Besylate) .... Take 1 Tablet By Mouth Once A Day 8)  Coreg 12.5  Mg  Tabs (Carvedilol) .... Take 1 Tablet By Mouth Two Times A Day 9)  Lisinopril 10 Mg Tabs (Lisinopril) .... Take 1 Tablet By Mouth Once A Day 10)  Famotidine 40 Mg Tabs (Famotidine) .... Take 1 Tablet By Mouth At Bedtime 11)  Benadryl 25 Mg Tabs (Diphenhydramine Hcl) .... As Needed 12)  Fluticasone Propionate 50 Mcg/act Susp (Fluticasone Propionate) .... Use One Spray in Each Nostril Twice A Day. 13)  Loratadine 10 Mg Tabs (Loratadine) .... Take 1 Pill By Mouth Once Daily As Needed For Allergy Symptoms  Allergies (verified): 1)  Flomax  Past History:  Past medical, surgical, family and social histories (including risk factors) reviewed, and no changes noted (except as noted below).  Past Medical History: Reviewed history from 09/18/2008 and no changes required. Current Problems:  HYPERTENSION, BENIGN ESSENTIAL (ICD-401.1) PERIPHERAL VASCULAR DISEASE (ICD-443.9)s/p R. 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  Past Surgical History: Reviewed history from 09/18/2008 and no changes required. Above knee amputation-right PTCA/stent Colon polypectomy-Nov 07 Rectal polyp   Left heart catheterization with coronary angiography and left ventriculography.Percutaneous coronary intervention with high-speed rotational atherectomy followed by placement of two bare metal stents in the mid  right coronary. Temporary pacemaker placement right in the right ventricle.RESULTS:  Hemodynamics:  Left ventricular pressure 140/12, aortic pressure 140/82. There is no aortic valve gradient. Left ventriculogram:  There is very mild akinesis of anterolateral wall as well as the inferior apical wall. Otherwise wall motion is normal. Ejection fraction estimated at 60%. There is trace mitral regurgitation. MWP/MEDQ  D:  04/24/2004  T:  04/24/2004  Job:  [id number] cc:   Olga Millers, M.D. [facility]  Family History: Reviewed history from 09/07/2007 and no changes required. Family History of CAD Male 1st degree relative <60 Family History of CAD Male 1st degree relative <50 Family History Diabetes 1st degree relative Family History Hypertension  Social History: Reviewed history from 03/28/2008 and no changes required. Married to 2nd wife. 1st wife died of cerebral aneurysm. 5 children (all by 1st wife) Former Smoker Hx of alcohol abuse (sober x 20 yrs) Retired Arboriculturist from [employer]  Physical Exam  General:  alert, well-developed, well-nourished, and  well-hydrated.   Head:  normocephalic and atraumatic.   Eyes:  vision grossly intact, pupils equal, pupils round, and pupils reactive to light.   Mouth:  pharynx pink and moist, no erythema, and no exudates.   Lungs:  normal respiratory effort, no intercostal retractions, no accessory muscle use, normal breath sounds, no crackles, and no wheezes.   Heart:  normal rate, regular rhythm, no murmur, no gallop, and no rub.     Abdomen:  soft, non-tender, normal bowel sounds, no distention, no masses, no guarding, no rigidity, and no rebound tenderness.   Extremities:  No C/C/E Neurologic:  alert & oriented X3 and cranial nerves II-XII intact.   Skin:  Skin is mildly dry and cracked particulary in bilateral hands, feet, and legs.  Turgor normal, color normal, no rashes, no suspicious lesions, no ecchymoses, no ulcerations, and no edema.   Psych:  memory intact for recent and remote, normally interactive, and good eye contact.     Impression & Recommendations:  Problem # 1:  HYPERTENSION, BENIGN ESSENTIAL (ICD-401.1) Pt doing well on current regmien; BP slightly elvated today from prior visit.  Will continue to monitor closely; no changes to regimen at this time.  Will check CMET today.  His updated medication list for this problem includes:    Hytrin 10 Mg Caps (Terazosin hcl) .[name]... Take 1 tablet by mouth at bedtime    Norvasc 10 Mg Tabs (Amlodipine besylate) .[name]... Take 1 tablet by mouth once a day    Coreg 12.5 Mg Tabs (Carvedilol) .[name]... Take 1 tablet by mouth two times a day    Lisinopril 10 Mg Tabs (Lisinopril) .[name]... Take 1 tablet by mouth once a day  Orders: T-Comprehensive Metabolic Panel ([id number])  BP today: 149/83 Prior BP: 130/76 (01/12/2009)  Labs Reviewed: K+: 3.9 (11/15/2008) Creat: : 1.15 (11/15/2008)   Chol: 100 (09/19/2008)   HDL: 23.90 (09/19/2008)   LDL: 42 (09/19/2008)   TG: 267.0 (09/19/2008)  Problem # 2:  DIABETES MELLITUS, TYPE II (ICD-250.00) CBG log not available for review today.  Advised pt to continue taking all medications as directed.  Pt and wife are attempting to make healthy dietary changes to include more fruits and vegetable; encouraged them to continue this but to be careful of fruit intake as fruit is higher in sugar.  Will check A1c today.  His updated medication list for this problem includes:    Novolog Mix 70/30 Flexpen 70-30 % Susp (Insulin asp prot & asp (hum))  ..... Inject 38 units in the morning and 26 units in the evening as directed    Lisinopril 10 Mg Tabs (Lisinopril) .[name]... Take 1 tablet by mouth once a day  Orders: T- Capillary Blood Glucose ([id number]) T-Hgb A1C (in-house) (53299ME)  Problem # 3:  RENAL INSUFFICIENCY (ICD-588.9) Pt is now taking ACE regularly.  Will check CMET today to check renal function. Orders: T-Comprehensive Metabolic Panel ([id number])  Problem # 4:  CONSTIPATION, INTERMITTENT (ICD-564.00)  Pt reports symptoms of mild constipation/hard stools and is without bloody stool/dark black stools.  Advised pt to try OTC docusate to help soften stool.  Discussed Miralax as an additional option if docusate does not help.  WIll follow this up at next visit.  Problem # 5:  DRY SKIN (ICD-701.1) Advised pt to try OTC lotions including Eucerin, Aquaphor, or Nivea brands for symptomatic relief.  Will f/u at next visit.  Problem # 6:  RHINITIS (ICD-472.0) Pt with mild nasal congestion, likely from seasonal allegies.  Will give  rx for fluticasone nasal spray and loratadine to help with symptomatic relief.  Will f/u at next visit.  Complete Medication List: 1)  Pravachol 20 Mg Tabs (Pravastatin sodium) .... Take 1 tablet by mouth once a day 2)  Plavix 75 Mg Tabs (Clopidogrel bisulfate) .... Take 1 tablet by mouth once a day 3)  Accu-chek Compact Test Drum Strp (Glucose blood) 4)  Novolog Mix 70/30 Flexpen 70-30 % Susp (Insulin asp prot & asp (hum)) .... Inject 38 units in the morning and 26 units in the evening as directed 5)  Pen Needles 5/16" 31g X 8 Mm Misc (Insulin pen needle) .... To take insulin twice daily 6)  Hytrin 10 Mg Caps (Terazosin hcl) .... Take 1 tablet by mouth at bedtime 7)  Norvasc 10 Mg Tabs (Amlodipine besylate) .... Take 1 tablet by mouth once a day 8)  Coreg 12.5 Mg Tabs (Carvedilol) .... Take 1 tablet by mouth two times a day 9)  Lisinopril 10 Mg Tabs (Lisinopril) .... Take 1 tablet by mouth once a day 10)   Famotidine 40 Mg Tabs (Famotidine) .... Take 1 tablet by mouth at bedtime 11)  Benadryl 25 Mg Tabs (Diphenhydramine hcl) .... As needed 12)  Fluticasone Propionate 50 Mcg/act Susp (Fluticasone propionate) .... Use one spray in each nostril twice a day. 13)  Loratadine 10 Mg Tabs (Loratadine) .... Take 1 pill by mouth once daily as needed for allergy symptoms  Patient Instructions: 1)  Please schedule a follow-up appointment in 3 months. 2)  Try Docusate to help soften stools. 3)  Eucerin, Aquaphor, and Nivea are all good lotions to use for dry skin. 4)  All of your refills were sent electronically to your [place] pharmacy. 5)  You have been given a prescription for a nasal spray.  Use as directed for nasal congestion and drainage. 6)  You have also been given a prescription for Loratadine. This is similar to benadryl, but will not make you sleepy. Prescriptions: LORATADINE 10 MG TABS (LORATADINE) take 1 pill by mouth once daily as needed for allergy symptoms  #30 x 3   Entered and Authorized by:   Nelda Bucks DO   Signed by:   Nelda Bucks DO on 05/25/2009   Method used:   Electronically to        [facility] (retail)       [address]       [place], [place]  [address]       Ph: [phone]       Fax: [phone]   RxID:   [id number] FLUTICASONE PROPIONATE 50 MCG/ACT SUSP (FLUTICASONE PROPIONATE) Use one spray in each nostril twice a day.  #1 x 3   Entered and Authorized by:   Nelda Bucks DO   Signed by:   Nelda Bucks DO on 05/25/2009   Method used:   Electronically to        [facility] (retail)       [address]       [place], [place]  [address]       Ph: [phone]       Fax: [phone]   RxID:   [id number] NORVASC 10 MG TABS (AMLODIPINE BESYLATE) Take 1 tablet by mouth once a day  #30 x 3   Entered and Authorized by:   Nelda Bucks DO   Signed by:   Belenda Cruise  Francella Barnett DO on 05/25/2009   Method  used:   Electronically to        [facility] (retail)       [address]       [place], [place]  [address]       Ph: [phone]       Fax: [phone]   RxID:   [id number] PRAVACHOL 20 MG TABS (PRAVASTATIN SODIUM) Take 1 tablet by mouth once a day  #90 x 3   Entered and Authorized by:   Nelda Bucks DO   Signed by:   Nelda Bucks DO on 05/25/2009   Method used:   Electronically to        [facility] (retail)       [address]       [place], [place]  [address]       Ph: [phone]       Fax: [phone]   RxID:   [id number]    Process Orders Check Orders Results:     [facility]: Check successful Tests Sent for requisitioning (July 02, 2009 8:48 AM):     05/25/2009: [facility] -- T-Comprehensive Metabolic Panel [id number] (signed)    Prevention & Chronic Care Immunizations   Influenza vaccine: Fluvax 3+  (01/12/2009)   Influenza vaccine deferral: Deferred  (10/12/2008)   Influenza vaccine due: 11/22/2008    Tetanus booster: 11/07/2008: Td   Td booster deferral: Not indicated  (01/12/2009)    Pneumococcal vaccine: Not documented   Pneumococcal vaccine deferral: Deferred  (10/12/2008)    H. zoster vaccine: Not documented  Colorectal Screening   Hemoccult: Not documented   Hemoccult action/deferral: Ordered  (11/07/2008)    Colonoscopy: Abnormal  (02/01/2006)   Colonoscopy action/deferral: Deferred  (10/12/2008)   Colonoscopy due: 02/02/2016  Other Screening   PSA: Not documented   PSA action/deferral: Discussion deferred  (05/25/2009)   Smoking status: quit  (05/25/2009)  Diabetes Mellitus   HgbA1C: 7.4  (05/25/2009)   HgbA1C action/deferral: Ordered  (01/12/2009)    Eye exam: Not documented    Foot exam: yes  (10/12/2008)   Foot exam action/deferral: Do today   High risk foot: Yes  (10/12/2008)   Foot care education: Done  (10/12/2008)    Foot exam due: 10/12/2009    Urine microalbumin/creatinine ratio: 147.0  (01/13/2009)   Urine microalbumin action/deferral: Ordered    Diabetes flowsheet reviewed?: Yes   Progress toward A1C goal: Deteriorated  Lipids   Total Cholesterol: 100  (09/19/2008)   LDL: 42  (09/19/2008)   LDL Direct: 42.1  (09/19/2008)   HDL: 23.90  (09/19/2008)   Triglycerides: 267.0  (09/19/2008)    SGOT (AST): 31  (10/12/2008)   SGPT (ALT): 28  (10/12/2008) CMP ordered    Alkaline phosphatase: 57  (10/12/2008)   Total bilirubin: 0.4  (10/12/2008)    Lipid flowsheet reviewed?: Yes   Progress toward LDL goal: Deteriorated  Hypertension   Last Blood Pressure: 149 / 83  (05/25/2009)   Serum creatinine: 1.15  (11/15/2008)   Serum potassium 3.9  (11/15/2008) CMP ordered     Hypertension flowsheet reviewed?: Yes   Progress toward BP goal: Deteriorated  Self-Management Support :    Patient will work on the following items until the next clinic visit to reach self-care goals:     Medications and monitoring: take my medicines every day, check  my blood sugar, examine my feet every day  (05/25/2009)     Eating: drink diet soda or water instead of juice or soda, eat more vegetables, use fresh or frozen vegetables, eat foods that are low in salt, eat baked foods instead of fried foods, eat fruit for snacks and desserts  (05/25/2009)     Activity: take a 30 minute walk every day  (05/25/2009)     Other: Exercise inside his home/stretching at counter  (01/12/2009)    Diabetes self-management support: Education handout, Resources for patients handout  (05/25/2009)   Diabetes education handout printed   Last diabetes self-management training by diabetes educator: 05/06/2007   Last medical nutrition therapy: 05/06/2007    Hypertension self-management support: Education handout, Resources for patients handout  (05/25/2009)   Hypertension education handout printed    Lipid self-management support: Education  handout, Resources for patients handout  (05/25/2009)     Lipid education handout printed      Resource handout printed.   Laboratory Results   Blood Tests   Date/Time Received: May 25, 2009 3:54 PM  Date/Time Reported: Burke Keels  May 25, 2009 3:54 PM   HGBA1C: 7.4%   (Normal Range: Non-Diabetic - 3-6%   Control Diabetic - 6-8%) CBG Random:: 130mg /dL

## 2010-04-25 NOTE — Progress Notes (Signed)
Summary: med refill/gp  Phone Note Refill Request Message from:  Fax from Pharmacy on March 12, 2010 10:17 AM  Refills Requested: Medication #1:  NOVOLOG MIX 70/30 FLEXPEN 70-30 % SUSP Inject 40 units in the morning and 28 units in the evening as directed Last appt. July 22.  Initial call taken by: Chinita Pester RN,  March 12, 2010 10:17 AM  Follow-up for Phone Call        Rx faxed to pharmacy. Follow-up by: Margarito Liner MD,  March 12, 2010 10:28 AM    Prescriptions: NOVOLOG MIX 70/30 FLEXPEN 70-30 % SUSP (INSULIN ASP PROT & ASP (HUM)) Inject 40 units in the morning and 28 units in the evening as directed  #10 pens x 1   Entered and Authorized by:   Margarito Liner MD   Signed by:   Margarito Liner MD on 03/12/2010   Method used:   Faxed to ...       [facility]) (mail-order)       [address]       [place], [place]  [address]       Ph: [phone]       Fax: [phone]   RxID:   [id number]

## 2010-04-25 NOTE — Assessment & Plan Note (Signed)
Summary: pe check out/sf  Medications Added ASPIRIN 81 MG TBEC (ASPIRIN) Take one tablet by mouth daily        Primary Provider:  Nelda Bucks DO   History of Present Illness: [name] [name] is a very pleasant  gentleman, has a history of coronary disease status post PCI of his right coronary artery with drug- eluting stent in February 2006.  His most recent Myoview in November, 2009, showed no ischemia or infarction.  His ejection fraction was 65%. An echocardiogram was also performed in December 2007, that showed normal LV function. An abdominal ultrasound in August 2006 showed no aneurysm. Carotid Dopplers in August of 2006 showed probable 0-39% stenosis. I last saw him in March of 2011. Since then the patient denies any dyspnea on exertion, orthopnea, PND, palpitations, syncope or chest pain. Mild pedal edema.   Current Medications (verified): 1)  Pravachol 20 Mg Tabs (Pravastatin Sodium) .... Take 1 Tablet By Mouth Once A Day 2)  Accu-Chek Compact Test Drum  Strp (Glucose Blood) 3)  Novolog Mix 70/30 Flexpen 70-30 % Susp (Insulin Asp Prot & Asp (Hum)) .... Inject 40 Units in The Morning and 28 Units in The Evening As Directed 4)  Pen Needles 5/16" 31g X 8 Mm Misc (Insulin Pen Needle) .... To Take Insulin Twice Daily 5)  Terazosin Hcl 10 Mg Caps (Terazosin Hcl) .... Take 2 Capsule By Mouth At Bedtime Daily 6)  Norvasc 10 Mg Tabs (Amlodipine Besylate) .... Take 1 Tablet By Mouth Once A Day 7)  Coreg 12.5 Mg  Tabs (Carvedilol) .... Take 1 Tablet By Mouth Two Times A Day 8)  Lisinopril 10 Mg Tabs (Lisinopril) .... Take 1 Tablet By Mouth Once A Day 9)  Fluticasone Propionate 50 Mcg/act Susp (Fluticasone Propionate) .... Use One Spray in Each Nostril Twice A Day. 10)  Aspirin 81 Mg Tbec (Aspirin) .... 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 Above knee amputation-right PTCA/stent Colon polypectomy-Nov 07 Rectal polyp   Left heart catheterization with coronary angiography and left ventriculography.Percutaneous coronary intervention with high-speed rotational atherectomy followed by placement of two bare metal stents in the mid  right coronary. Temporary pacemaker placement right in the right ventricle.RESULTS:  Hemodynamics:  Left ventricular pressure 140/12, aortic pressure 140/82. There is no aortic valve gradient. Left ventriculogram:  There is very mild akinesis of anterolateral wall as well as the inferior apical wall. Otherwise wall motion is normal. Ejection fraction estimated at 60%. There is trace mitral regurgitation. MWP/MEDQ  D:  04/24/2004  T:  04/24/2004  Job:  [id number] cc:   Olga Millers, M.D. [facility]  Social History: Reviewed history from 03/28/2008 and no changes required. Married to 2nd wife. 1st wife died of cerebral aneurysm. 5 children (all by 1st wife) Former Smoker Hx of alcohol abuse (sober x 20 yrs) Retired Arboriculturist from [employer]  Review of Systems       Some unsteadiness with his gait  from previous prosthetic lower extremity but no fevers or chills, productive cough, hemoptysis, dysphasia, odynophagia, melena, hematochezia, dysuria, hematuria, rash, seizure activity, orthopnea, PND,  claudication. Remaining systems are negative.   Vital Signs:  Patient profile:   65 year old male Height:      68 inches Weight:      216 pounds BMI:     32.96 Pulse rate:   69 / minute Resp:     14 per minute BP sitting:   146 / 73  (left arm)  Vitals Entered By: Kem Parkinson (March 07, 2010 9:59 AM)  Physical Exam  General:  Well-developed well-nourished in no acute distress.  Skin is warm and dry.  HEENT is normal.  Neck is supple. No thyromegaly.  Chest is clear to auscultation with normal expansion.  Cardiovascular exam is regular rate and rhythm.  Abdominal exam nontender or distended. No masses palpated. Extremities show trace edema. Prosthetic right lower extremity neuro grossly intact    EKG  Procedure date:  03/07/2010  Findings:      Sinus rhythm, left axis deviation, right bundle branch block.  Impression & Recommendations:  Problem # 1:  RENAL INSUFFICIENCY (ICD-588.9) Monitored by primary care.  Problem # 2:  HYPERTENSION, BENIGN ESSENTIAL (ICD-401.1)  Blood pressure elevated. He is taking his Coreg only once daily. Increase to b.i.d. His updated medication list for this problem includes:    Terazosin Hcl 10 Mg Caps (Terazosin hcl) .[name]... Take 2 capsule by mouth at bedtime daily    Norvasc 10 Mg Tabs (Amlodipine besylate) .[name]... Take 1 tablet by mouth once a day    Coreg 12.5 Mg Tabs (Carvedilol) .[name]... Take 1 tablet by mouth two times a day    Lisinopril 10 Mg Tabs (Lisinopril) .[name]... Take 1 tablet by mouth once a day    Aspirin 81 Mg Tbec (Aspirin) .[name]... Take one tablet by mouth daily  Orders: Nuclear Stress Test (Nuc Stress Test)  Problem # 3:  HYPERLIPIDEMIA (ICD-272.4) Continue statin. Lipids and liver monitored by primary care. His updated  medication list for this problem includes:    Pravachol 20 Mg Tabs (Pravastatin sodium) .[name]... Take 1 tablet by mouth once a day  Problem # 4:  CORONARY ARTERY DISEASE (ICD-414.00)  Continue aspirin, beta blocker, ACE inhibitor and statin. Schedule Myoview. The following medications were removed from the medication list:    Plavix 75 Mg Tabs (Clopidogrel bisulfate) .[name]... Take 1 tablet by mouth once a day His updated medication list for this problem includes:    Norvasc 10 Mg Tabs (Amlodipine besylate) .[name]... Take 1 tablet by mouth once a day    Coreg 12.5 Mg Tabs (Carvedilol) .[name]... Take 1 tablet by mouth two times a day    Lisinopril 10 Mg Tabs (Lisinopril) .[name]... Take 1 tablet by mouth once a day    Aspirin 81 Mg Tbec (Aspirin) .[name]... Take one tablet by mouth daily  Orders: EKG w/ Interpretation (93000) Nuclear Stress Test (Nuc Stress Test)  Problem # 5:  DIABETES MELLITUS, TYPE II (ICD-250.00)  His updated medication list for this problem includes:    Novolog Mix 70/30 Flexpen 70-30 % Susp (Insulin asp prot & asp (hum)) ..... Inject 40 units in the morning and 28 units in the evening as directed    Lisinopril 10 Mg Tabs (Lisinopril) .[name]... Take 1 tablet by mouth once a day    Aspirin  81 Mg Tbec (Aspirin) .[name]... Take one tablet by mouth daily  Patient Instructions: 1)  Your physician recommends that you schedule a follow-up appointment in: 1 year with Dr. Jens [name] 2)  Your physician recommends that you continue on your current medications as directed. Please refer to the Current Medication list given to you today. 3)  Your physician has requested that you have an lexiscan myoview.  For further information please visit [email or web address]  Please follow instruction sheet, as given.

## 2010-04-25 NOTE — Assessment & Plan Note (Signed)
Summary: poss UTI, see note/pcp-mills/hla   Vital Signs:  Patient profile:   65 year old male Height:      68 inches (172.72 cm) Weight:      216.3 pounds (98.18 kg) BMI:     33.01 Temp:     97.6 degrees F (36.44 degrees C) oral Pulse rate:   70 / minute BP sitting:   140 / 78  (left arm) Cuff size:   regular  Vitals Entered By: Theotis Barrio NT II (October 12, 2009 12:07 PM) CC: PAINFUL URINATION WITH BURNING (UNABLE TO VOID AT THE PRESENT) STARTED THIS A.M./ MEDICATION REFILL Is Patient Diabetic? Yes Research Study Name: Pt's cbg 67-given 1 cup OJ to drink now.Cynda Familia [facility])  October 12, 2009 1:27 PM  Pain Assessment Patient in pain? no      Nutritional Status BMI of > 30 = obese CBG Result 67  Have you ever been in a relationship where you felt threatened, hurt or afraid?No   Does patient need assistance? Functional Status Self care Ambulation Impaired:Risk for fall   Primary Care Provider:  Nelda Bucks DO  CC:  PAINFUL URINATION WITH BURNING (UNABLE TO VOID AT THE PRESENT) STARTED THIS A.M./ MEDICATION REFILL.  History of Present Illness: 65 y/o male with pmh as described in the EMR; who comes tot he clinic complaining of suprapubic pain; dribbling, dysuria, incapacity to start urination and with increase urgency sensation. Symptoms started approx 2 days prior to visit.  Patient with well known hx of prostate problems follow by Dr.Nesi (urologist) and started on terazosin for almost a year now; next appointment with him would be in August (next month).  CBG's are better controlled with the new insulin regimen and his A1C will be check today.  Patient denies any urther complaints and is just in need of medications refills.  Problems Prior to Update: 1)  Fall, Hx of  (ICD-V15.88) 2)  Rhinitis  (ICD-472.0) 3)  Dry Skin  (ICD-701.1) 4)  Constipation, Intermittent  (ICD-564.00) 5)  Renal Insufficiency  (ICD-588.9) 6)  Hypertension, Benign Essential   (ICD-401.1) 7)  Peripheral Vascular Disease  (ICD-443.9) 8)  Cerebrovascular Accident, Hx of  (ICD-V12.50) 9)  Hyperlipidemia  (ICD-272.4) 10)  Coronary Artery Disease  (ICD-414.00) 11)  Chest Pain  (ICD-786.50) 12)  Flank Pain, Right  (ICD-789.09) 13)  Encounter For Long-term Use of Other Medications  (ICD-V58.69) 14)  Degenerative Joint Disease, Fingers  (ICD-715.94) 15)  Renal Failure, Acute  (ICD-584.9) 16)  Hyperkalemia  (ICD-276.7) 17)  Erectile Dysfunction, Organic  (ICD-607.84) 18)  Gerd  (ICD-530.81) 19)  Hypergammaglobulinemia, 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) 20)  Folliculitis  (ICD-704.8) 21)  Hyperplasia, 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.90) 22)  Proteinuria  (ICD-791.0) 23)  Microcytic Anemia  (ICD-281.9) 24)  Anemia, Chronic  (ICD-281.9) 25)  Diabetic Foot Ulcer, Left  (ICD-250.80) 26)  Aka, Right, Hx of  (ICD-V49.76) 27)  Diabetic Retinopathy  (ICD-250.50) 28)  Hx of Mi  (ICD-410.90) 29)  Peripheral Neuropathy  (ICD-356.9) 30)  Diabetes Mellitus, Type II  (ICD-250.00) 31)  Insomnia  (ICD-780.52)  Current Problems (verified): 1)  Fall, Hx of  (ICD-V15.88) 2)  Rhinitis  (ICD-472.0) 3)  Dry Skin  (ICD-701.1) 4)  Constipation, Intermittent  (ICD-564.00) 5)  Renal Insufficiency  (ICD-588.9) 6)  Hypertension, Benign Essential  (ICD-401.1) 7)  Peripheral Vascular Disease  (ICD-443.9) 8)  Cerebrovascular Accident, Hx of  (ICD-V12.50) 9)  Hyperlipidemia  (ICD-272.4) 10)  Coronary Artery Disease  (ICD-414.00) 11)  Chest Pain  (  ICD-786.50) 12)  Flank Pain, Right  (ICD-789.09) 13)  Encounter For Long-term Use of Other Medications  (ICD-V58.69) 14)  Degenerative Joint Disease, Fingers  (ICD-715.94) 15)  Renal Failure, Acute  (ICD-584.9) 16)  Hyperkalemia  (ICD-276.7) 17)  Erectile Dysfunction, Organic  (ICD-607.84) 18)  Gerd  (ICD-530.81) 19)  Hypergammaglobulinemia, 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 Take 1 Tablet By Mouth Once A Day 2)  Plavix 75 Mg Tabs (Clopidogrel Bisulfate) .... Take 1 Tablet By Mouth Once A Day 3)  Accu-Chek Compact Test Drum  Strp (Glucose Blood) 4)  Novolog Mix 70/30 Flexpen 70-30 % Susp (Insulin Asp Prot & Asp (Hum)) .... Inject 40 Units in The Morning and 28 Units in The Evening As Directed 5)  Pen Needles 5/16" 31g X 8 Mm Misc (Insulin Pen Needle) .... To Take Insulin Twice Daily 6)  Hytrin 10 Mg  Caps (Terazosin Hcl) .... Take 1 Tablet By Mouth At Bedtime 7)  Norvasc 10 Mg Tabs (Amlodipine Besylate) .... Take 1 Tablet By Mouth Once A Day 8)  Coreg 12.5 Mg  Tabs (Carvedilol) .... Take 1 Tablet By Mouth Two Times A Day 9)  Lisinopril 10 Mg Tabs (Lisinopril) .... Take 1 Tablet By Mouth Once A Day 10)  Famotidine 40 Mg Tabs (Famotidine) .... Take 1 Tablet By Mouth At Bedtime 11)  Benadryl 25 Mg Tabs (Diphenhydramine Hcl) .... As Needed 12)  Fluticasone Propionate 50 Mcg/act Susp (Fluticasone Propionate) .... Use One Spray in Each Nostril Twice A Day. 13)  Loratadine 10 Mg Tabs (Loratadine) .... Take 1 Pill By Mouth Once Daily As Needed For Allergy Symptoms 14)  Tramadol Hcl 50 Mg Tabs (Tramadol Hcl) .... Take 1 Tablet By Mouth Three Times A Day As Needed For Pain  Current Medications (verified): 1)  Pravachol 20 Mg Tabs (Pravastatin Sodium) .... Take 1 Tablet By Mouth Once A Day 2)  Plavix 75 Mg Tabs (Clopidogrel Bisulfate) .... Take 1 Tablet By Mouth Once A Day 3)  Accu-Chek Compact Test Drum   Strp (Glucose Blood) 4)  Novolog Mix 70/30 Flexpen 70-30 % Susp (Insulin Asp Prot & Asp (Hum)) .... Inject 40 Units in The Morning and 28 Units in The Evening As Directed 5)  Pen Needles 5/16" 31g X 8 Mm Misc (Insulin Pen Needle) .... To Take Insulin Twice Daily 6)  Hytrin 10 Mg  Caps (Terazosin Hcl) .... Take 1 Tablet By Mouth At Bedtime 7)  Norvasc 10 Mg Tabs (Amlodipine Besylate) .... Take 1 Tablet By Mouth Once A Day 8)  Coreg 12.5 Mg  Tabs (Carvedilol) .... Take 1 Tablet By Mouth Two Times A Day 9)  Lisinopril 10 Mg Tabs (Lisinopril) .... Take 1 Tablet By Mouth Once A Day 10)  Famotidine 40 Mg Tabs (Famotidine) .... Take 1 Tablet By Mouth At Bedtime 11)  Benadryl 25 Mg Tabs (Diphenhydramine Hcl) .... As Needed 12)  Fluticasone Propionate 50 Mcg/act Susp (Fluticasone Propionate) .... Use One Spray in Each Nostril  Twice A Day. 13)  Loratadine 10 Mg Tabs (Loratadine) .... Take 1 Pill By Mouth Once Daily As Needed For Allergy Symptoms  Allergies (verified): 1)  Flomax  Past History:  Past Medical History: Last updated: 06/15/2009 Current Problems:  HYPERTENSION, BENIGN ESSENTIAL (ICD-401.1) PERIPHERAL VASCULAR DISEASE (ICD-443.9)s/p R. 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.76) DIABETIC  RETINOPATHY (ICD-250.50) Hx of MI (ICD-410.90) PERIPHERAL NEUROPATHY (ICD-356.9) DIABETES MELLITUS, TYPE II (ICD-250.00)-complicated by Neuropathy and Retinopathy INSOMNIA (ICD-780.52) hx/o non-healing Left foot ulcer, s/p HBO tx, healed  Past  Surgical History: Last updated: 09/18/2008 Above knee amputation-right PTCA/stent Colon polypectomy-Nov 07 Rectal polyp   Left heart catheterization with coronary angiography and left ventriculography.Percutaneous coronary intervention with high-speed rotational atherectomy followed by placement of two bare metal stents in the mid  right coronary. Temporary pacemaker placement right in the right ventricle.RESULTS:  Hemodynamics:  Left ventricular pressure 140/12, aortic pressure 140/82. There is no aortic valve gradient. Left ventriculogram:  There is very mild akinesis of anterolateral wall as well as the inferior apical wall. Otherwise wall motion is normal. Ejection fraction estimated at 60%. There is trace mitral regurgitation. MWP/MEDQ  D:  04/24/2004  T:  04/24/2004  Job:  [id number] cc:   Olga Millers, M.D. [facility]  Family History: Last updated: 09/07/2007 Family History of CAD Male 1st degree relative <60 Family History of CAD Male 1st degree relative <50 Family History Diabetes 1st degree relative Family History Hypertension  Social History: Last updated: 03/28/2008 Married to 2nd wife. 1st wife died of cerebral aneurysm. 5 children (all by 1st wife) Former Smoker Hx of alcohol abuse (sober x 20 yrs) Retired Arboriculturist from [employer]  Risk Factors: Exercise: no (08/08/2009)  Risk Factors: Smoking Status: quit (08/08/2009)  Review of Systems       As per HPI.  Physical Exam  General:  alert, well-developed, and well-nourished.   Lungs:  normal respiratory effort, no intercostal retractions, no accessory muscle use, and normal breath sounds.   Heart:  normal rate, regular rhythm, no murmur, and no gallop.   Abdomen:  soft, normal bowel sounds, no guarding, and no rigidity.  Positive suprapubic discomfort and increased urgency with abd palpation.  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  wheel chair bound; craniall nerves II-XII intact.     Impression & Recommendations:  Problem # 1:  UTI (ICD-599.0) most likely due to BPH. While in the office in and out cath was performed providing relieved and obtaining sample for culture and sensitivity. Will increased his terazosin to 20mg  at bedtime and he will follow with Dr. Brunilda Payor in August. Empiric treatment with cipro for 10 days was started, if patient urine culture demonstarted something different antibiotics would be adjusted.  Patient with tramadol for pain control.  His updated medication list for this problem includes:    Ciprofloxacin Hcl 250 Mg Tabs (Ciprofloxacin hcl) .[name]... Take 1 tablet by mouth two times a day  Orders: T-Urinalysis ([id number]) T-Culture, Urine ([id number])  Problem # 2:  DIABETES MELLITUS, TYPE II (ICD-250.00) Improving; A1C 7.2; will continue current regimen for now; will encourage him to follow a low carb diet.  During this visit patient was fasting and his CBG was 67; he received OJ and was advised to have lunch right away, he was asymptomatic.  His updated medication list for this problem includes:    Novolog Mix 70/30 Flexpen 70-30 % Susp (Insulin asp prot & asp (hum)) ..... Inject 40 units in the morning and 28 units in the evening as directed    Lisinopril 10 Mg Tabs (Lisinopril) .[name]... Take 1 tablet by mouth once a day  Orders: T- Capillary Blood Glucose (82948) T-Hgb A1C (in-house) (16109UE)  Problem # 3:  GERD (ICD-530.81) stable and well controlled. Will continue treatment with famotidine.  His updated medication list for this problem includes:    Famotidine 40 Mg Tabs (Famotidine) .[name]... Take 1 tablet by mouth at bedtime  Problem # 4:  HYPERTENSION, BENIGN ESSENTIAL (ICD-401.1) almost at goal, and today patient was in severe pain (which can elevated BP as well). Will hold any medication changes today; patient advised to follow a low sodium diet and to be compliant with  antihypertensives. terazosin was increased for his BPH and can also help reducing BP a little bit; will follow BP changes during followup visit.  His updated medication list for this problem includes:    Terazosin Hcl 10 Mg Caps (Terazosin hcl) .[name]... Take 2 capsule by mouth at bedtime daily    Norvasc 10 Mg Tabs (Amlodipine besylate) .[name]... Take 1 tablet by mouth once a day    Coreg 12.5 Mg Tabs (Carvedilol) .[name]... Take 1 tablet by mouth two times a day    Lisinopril 10 Mg Tabs (Lisinopril) .[name]... Take 1 tablet by mouth once a day  Orders: T-CMP with Estimated GFR ([id number])  Problem # 5:  HYPERLIPIDEMIA (ICD-272.4) LDL at goal; will continue low dose statins and will advise patient to follow a low fat diet to help with his triglycerides as well.  His updated medication list for this problem includes:    Pravachol 20 Mg Tabs (Pravastatin sodium) .[name]... Take 1 tablet by mouth once a day  Complete Medication List: 1)  Pravachol 20 Mg Tabs (Pravastatin sodium) .... Take 1 tablet by mouth once a day 2)  Plavix 75 Mg Tabs (Clopidogrel bisulfate) .... Take 1 tablet by mouth once a day 3)  Accu-chek Compact Test Drum Strp (Glucose blood) 4)  Novolog Mix 70/30 Flexpen 70-30 % Susp (Insulin asp prot & asp (hum)) .... Inject 40 units in the morning and 28 units in the evening as directed 5)  Pen Needles 5/16" 31g X 8 Mm Misc (Insulin pen needle) .... To take insulin twice daily 6)  Terazosin Hcl 10 Mg Caps (Terazosin hcl) .... Take 2 capsule by mouth at bedtime daily 7)  Norvasc 10 Mg Tabs (Amlodipine besylate) .... Take 1 tablet by mouth once a day 8)  Coreg 12.5 Mg Tabs (Carvedilol) .... Take 1 tablet by mouth two times a day 9)  Lisinopril 10 Mg Tabs (Lisinopril) .... Take 1 tablet by mouth once a day 10)  Famotidine 40 Mg Tabs (Famotidine) .... Take 1 tablet by mouth at bedtime 11)  Benadryl 25 Mg Tabs (Diphenhydramine hcl) .... As needed 12)  Fluticasone Propionate 50 Mcg/act Susp (Fluticasone  propionate) .... Use one spray in each nostril twice a day. 13)  Loratadine 10 Mg Tabs (Loratadine) .... Take 1 pill by mouth once daily as needed for allergy symptoms 14)  Tramadol Hcl 50 Mg Tabs (Tramadol hcl) .... Take 1 tablet by mouth three times a day as needed  for pain 15)  Ciprofloxacin Hcl 250 Mg Tabs (Ciprofloxacin hcl) .... Take 1 tablet by mouth two times a day  Patient Instructions: 1)  Follow a low sodium and low carbohydrates diet. 2)  Take your medications as prescribed. 3)  Please schedule a follow-up appointment in 3 months. (sooner if symptoms failed to improved). 4)  You will be called with any abnormalities in the tests scheduled or performed today.  If you don't hear from [place] within a week from when the test was performed, you can assume that your test was normal. 5)  Make sure you follow with Dr. Brunilda Payor in August. 6)  Avoid foods high in acid (tomatoes, citrus juices, spicy foods). Avoid eating within two hours of lying down or before exercising. Do not over eat; try smaller more frequent meals. Elevate head of bed twelve inches when sleeping. 7)  Ok to use tramadol as directed for pain control. Prescriptions: TRAMADOL HCL 50 MG TABS (TRAMADOL HCL) Take 1 tablet by mouth three times a day as needed for pain  #90 x 0   Entered and Authorized by:   Vassie Loll MD   Signed by:   Vassie Loll MD on 10/12/2009   Method used:   Electronically to        [facility] (retail)       [address]       [place], [place]  [address]       Ph: [phone]       Fax: [phone]   RxID:   [id number] TERAZOSIN HCL 10 MG CAPS (TERAZOSIN HCL) Take 2 capsule by mouth at bedtime daily  #62 x 3   Entered and Authorized by:   Vassie Loll MD   Signed by:   Vassie Loll MD on 10/12/2009   Method used:   Electronically to        [facility] (retail)       [address]       [place], [place]  [address]       Ph:  [phone]       Fax: [phone]   RxID:   [id number] CIPROFLOXACIN HCL 250 MG TABS (CIPROFLOXACIN HCL) Take 1 tablet by mouth two times a day  #20 x 0   Entered and Authorized by:   Vassie Loll MD   Signed by:   Vassie Loll MD on 10/12/2009   Method used:   Electronically to        [facility] (retail)       [address]       [place], [place]  [address]       Ph: [phone]       Fax: [phone]   RxID:   [id number]  Process Orders Check Orders Results:     [facility]: Check successful Tests Sent for requisitioning (October 12, 2009 1:54 PM):     10/12/2009: [facility] -- T-CMP with Estimated GFR [[id number]] (signed)     10/12/2009: [facility] -- T-Urinalysis [[id number]] (signed)     10/12/2009: [facility] -- T-Culture, Urine [[id number]] (signed)    Prevention & Chronic Care Immunizations   Influenza vaccine: Fluvax 3+  (01/12/2009)   Influenza vaccine deferral: Deferred  (10/12/2009)   Influenza vaccine due: 11/22/2008    Tetanus booster: 11/07/2008:  Td   Td booster deferral: Deferred  (10/12/2009)    Pneumococcal vaccine: Not documented   Pneumococcal vaccine deferral: Deferred  (10/12/2009)    H. zoster vaccine: Not documented  Colorectal Screening   Hemoccult: Not documented   Hemoccult action/deferral: Ordered  (11/07/2008)    Colonoscopy: Abnormal  (02/01/2006)   Colonoscopy action/deferral: Deferred  (10/12/2008)   Colonoscopy due: 02/02/2016  Other Screening   PSA: Not documented   PSA action/deferral: Discussion deferred  (05/25/2009)   Smoking status: quit  (08/08/2009)  Diabetes Mellitus   HgbA1C: 7.2  (10/12/2009)   HgbA1C action/deferral: Ordered  (01/12/2009)    Eye exam: Not documented    Foot exam: yes  (10/12/2008)   Foot exam action/deferral: Do today   High risk foot: Yes  (10/12/2008)   Foot care education: Done   (10/12/2008)   Foot exam due: 10/12/2009    Urine microalbumin/creatinine ratio: 147.0  (01/13/2009)   Urine microalbumin action/deferral: Ordered    Diabetes flowsheet reviewed?: Yes   Progress toward A1C goal: Improved  Lipids   Total Cholesterol: 94  (08/08/2009)   LDL: 22  (08/08/2009)   LDL Direct: 42.1  (09/19/2008)   HDL: 23  (08/08/2009)   Triglycerides: 243  (08/08/2009)    SGOT (AST): 31  (08/08/2009)   BMP action: Ordered   SGPT (ALT): 31  (08/08/2009)   Alkaline phosphatase: 93  (08/08/2009)   Total bilirubin: 0.5  (08/08/2009)    Lipid flowsheet reviewed?: Yes   Progress toward LDL goal: At goal  Hypertension   Last Blood Pressure: 140 / 78  (10/12/2009)   Serum creatinine: 1.30  (08/08/2009)   BMP action: Ordered   Serum potassium 5.1  (08/08/2009)    Hypertension flowsheet reviewed?: Yes   Progress toward BP goal: Unchanged  Self-Management Support :   Personal Goals (by the next clinic visit) :     Personal A1C goal: 6  (10/12/2009)     Personal blood pressure goal: 130/80  (10/12/2009)     Personal LDL goal: 70  (10/12/2009)    Patient will work on the following items until the next clinic visit to reach self-care goals:     Medications and monitoring: take my medicines every day, check my blood sugar, bring all of my medications to every visit  (10/12/2009)     Eating: drink diet soda or water instead of juice or soda, eat more vegetables, use fresh or frozen vegetables, eat foods that are low in salt, eat baked foods instead of fried foods, eat fruit for snacks and desserts, limit or avoid alcohol  (10/12/2009)     Activity: take a 30 minute walk every day  (05/25/2009)     Other: Exercise inside his home/stretching at counter  (01/12/2009)    Diabetes self-management support: Resources for patients handout, Written self-care plan  (10/12/2009)   Diabetes care plan printed   Last diabetes self-management training by diabetes educator: 05/06/2007    Last medical nutrition therapy: 05/06/2007    Hypertension self-management support: Resources for patients handout, Written self-care plan  (10/12/2009)   Hypertension self-care plan printed.    Lipid self-management support: Resources for patients handout, Written self-care plan  (10/12/2009)   Lipid self-care plan printed.    Self-management comments: PATIENT WORKS IN THE YARD      Resource handout printed.   Laboratory Results   Blood Tests   Date/Time Received: October 12, 2009 1:29 PM  Date/Time Reported: Alric Quan  October 12, 2009 1:29 PM  HGBA1C: 7.2%   (Normal Range: Non-Diabetic - 3-6%   Control Diabetic - 6-8%) CBG Random:: 67mg /dL  Comments: CBG reported to Alba Cory, CMA    Juice given to patient  Alric Quan  October 12, 2009 1:30 PM

## 2010-04-25 NOTE — Progress Notes (Signed)
Summary: med refill/gp  Phone Note Refill Request Message from:  Fax from Pharmacy on November 02, 2009 1:59 PM  Refills Requested: Medication #1:  PLAVIX 75 MG TABS Take 1 tablet by mouth once a day   Last Refilled: 10/01/2009  Method Requested: Electronic Initial call taken by: Chinita Pester RN,  November 02, 2009 1:59 PM  Follow-up for Phone Call        Rx sent to pharmacy Follow-up by: Nelda Bucks DO,  November 02, 2009 2:56 PM    Prescriptions: PLAVIX 75 MG TABS (CLOPIDOGREL BISULFATE) Take 1 tablet by mouth once a day  #30 x 11   Entered and Authorized by:   Nelda Bucks DO   Signed by:   Nelda Bucks DO on 11/02/2009   Method used:   Electronically to        [facility] (retail)       [address]       [place], [place]  [address]       Ph: [phone]       Fax: [phone]   RxID:   [id number]

## 2010-04-25 NOTE — Letter (Signed)
Summary: BLOOD GLUCOSE   BLOOD GLUCOSE   Imported By: Margie Billet 04/16/2010 11:13:25  _____________________________________________________________________  External Attachment:    Type:   Image     Comment:   External Document

## 2010-04-25 NOTE — Progress Notes (Signed)
Summary: Refill/gh  Phone Note Refill Request Message from:  Fax from Pharmacy on April 16, 2009 11:22 AM  Refills Requested: Medication #1:  COREG 12.5 MG  TABS Take 1 tablet by mouth two times a day   Last Refilled: 03/12/2009 Last OV and labs were done 11/15/2008.   Method Requested: Electronic Initial call taken by: Angelina Ok RN,  April 16, 2009 11:23 AM  Follow-up for Phone Call       Follow-up by: Blanch Media MD,  April 16, 2009 1:48 PM    Prescriptions: COREG 12.5 MG  TABS (CARVEDILOL) Take 1 tablet by mouth two times a day  #60 x 5   Entered and Authorized by:   Blanch Media MD   Signed by:   Blanch Media MD on 04/16/2009   Method used:   Electronically to        [facility] (retail)       [address]       [place], [place]  [address]       Ph: [phone]       Fax: [phone]   RxID:   [id number]

## 2010-04-25 NOTE — Progress Notes (Signed)
Summary: med refill/gp  Phone Note Refill Request Message from:  Patient on October 22, 2009 11:42 AM  Refills Requested: Medication #1:  COREG 12.5 MG  TABS Take 1 tablet by mouth two times a day Last appt. 7.22.11   Method Requested: Electronic Initial call taken by: Chinita Pester RN,  October 22, 2009 11:42 AM  Follow-up for Phone Call        Rx electronically submitted  to pharmacy Follow-up by: Nelda Bucks DO,  October 22, 2009 12:22 PM    Prescriptions: COREG 12.5 MG  TABS (CARVEDILOL) Take 1 tablet by mouth two times a day  #60 x 6   Entered and Authorized by:   Nelda Bucks DO   Signed by:   Nelda Bucks DO on 10/22/2009   Method used:   Electronically to        [facility] (retail)       [address]       [place], [place]  [address]       Ph: [phone]       Fax: [phone]   RxID:   [id number]

## 2010-04-25 NOTE — Assessment & Plan Note (Signed)
Summary: est-ck/fu/meds/cfb   Vital Signs:  Patient profile:   65 year old male Height:      68 inches (172.72 cm) Weight:      230.0 pounds (104.55 kg) BMI:     35.10 Temp:     97.1 degrees F (36.17 degrees C) Pulse rate:   70 / minute BP sitting:   147 / 75  (left arm) Cuff size:   large  Vitals Entered By: Chinita Pester RN (April 12, 2010 3:09 PM) CC: Left flank pain x 4 weeks w/movement. Med  refill., Back Pain Is Patient Diabetic? Yes Did you bring your meter with you today? Yes Pain Assessment Patient in pain? no      Nutritional Status BMI of > 30 = obese CBG Result 137  Have you ever been in a relationship where you felt threatened, hurt or afraid?Unable to ask; someone w/pt.   Does patient need assistance? Functional Status Cook/clean, Shopping, Social activities Ambulation Impaired:Risk for fall Comments Right AKA   Diabetic Foot Exam Last Podiatry Exam Date: 04/12/2010  Foot Inspection Is there a history of a foot ulcer?              Yes Is there a foot ulcer now?              No Can the patient see the bottom of their feet?          No Are the shoes appropriate in style and fit?          Yes Is there swelling or an abnormal foot shape?          Yes Are the toenails long?                No Are the toenails thick?                No Are the toenails ingrown?              No Is there heavy callous build-up?              No Is there pain in the calf muscle (Intermittent claudication) when walking?    No Diabetic Foot Care Education Patient educated on appropriate care of diabetic feet.  Pulse Check          Right Foot          Left Foot Dorsalis Pedis:                   normal Comments: Right  AKA Decreased sensation on monofilament test.   10-g (5.07) Semmes-Weinstein Monofilament Test Performed by: Chinita Pester RN          Right Foot          Left Foot Visual Inspection                 Test Control      normal         normal Site 1                     abnormal Site 2                    abnormal Site 3                    abnormal Site 4                    abnormal Site  5                    normal Site 6                    normal Site 7                    normal Site 8                    normal Site 9                    normal  Impression                abnormal   Primary Care Provider:  Nelda Bucks DO  CC:  Left flank pain x 4 weeks w/movement. Med  refill. and Back Pain.  History of Present Illness: 65 y/o male with pmh as described in the EMR; who presents to clinic for routine f/u and complaint of left sided hip pain along belt line.  Pt reports pain in left hip for 4-5 weeks which he thinks may be related to the way he sits in his wheelchair. Pt describes pain as intermittent, non-radiating,  4-5/10 that is not dull, burning, throbbing, sharp - just "sore".  Pain occurs with movement and laying in bed; no alleviating factors.     Pt denies fevers, chills, recent falls or trauma.   Patient with well known hx of prostate problems follow by Dr.Nesi (urologist) and is doing well on terazosin  DM2: pt reports compliance with his med regimen.  Denies hypoglycemic events  Patient denies any other complaints or concerns.  Depression History:      The patient denies a depressed mood most of the day and a diminished interest in his usual daily activities.         Preventive Screening-Counseling & Management  Alcohol-Tobacco     Alcohol drinks/day: 0     Smoking Status: quit     Year Quit: 1988  Caffeine-Diet-Exercise     Does Patient Exercise: no  Current Medications (verified): 1)  Pravachol 20 Mg Tabs (Pravastatin Sodium) .... Take 1 Tablet By Mouth Once A Day 2)  Accu-Chek Compact Test Drum  Strp (Glucose Blood) 3)  Novolog Mix 70/30 Flexpen 70-30 % Susp (Insulin Asp Prot & Asp (Hum)) .... Inject 40 Units in The Morning and 28 Units in The Evening As Directed 4)  Pen Needles 5/16" 31g X 8 Mm Misc (Insulin Pen  Needle) .... To Take Insulin Twice Daily 5)  Terazosin Hcl 10 Mg Caps (Terazosin Hcl) .... Take 2 Capsule By Mouth At Bedtime Daily 6)  Norvasc 10 Mg Tabs (Amlodipine Besylate) .... Take 1 Tablet By Mouth Once A Day 7)  Coreg 12.5 Mg  Tabs (Carvedilol) .... Take 1 Tablet By Mouth Two Times A Day 8)  Lisinopril 10 Mg Tabs (Lisinopril) .... Take 1 Tablet By Mouth Once A Day 9)  Fluticasone Propionate 50 Mcg/act Susp (Fluticasone Propionate) .... Use One Spray in Each Nostril Twice A Day. 10)  Aspirin 81 Mg Tbec (Aspirin) .... Take One Tablet By Mouth Daily 11)  Furosemide 20 Mg Tabs (Furosemide) .... Take 1 Tablet By Mouth Once A Day  Allergies (verified): 1)  Flomax  Past History:  Past medical, surgical, family and social histories (including risk factors) reviewed for relevance to current acute and chronic problems.  Past Medical History: Reviewed history from 03/07/2010 and  no changes required. 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 Above knee amputation-right PTCA/stent Colon polypectomy-Nov 07 Rectal polyp   Left heart catheterization with  coronary angiography and left ventriculography.Percutaneous coronary intervention with high-speed rotational atherectomy followed by placement of two bare metal stents in the mid  right coronary. Temporary pacemaker placement right in the right ventricle.RESULTS:  Hemodynamics:  Left ventricular pressure 140/12, aortic pressure 140/82. There is no aortic valve gradient. Left ventriculogram:  There is very mild akinesis of anterolateral wall as well as the inferior apical wall. Otherwise wall motion is normal. Ejection fraction estimated at 60%. There is trace mitral regurgitation. MWP/MEDQ  D:  04/24/2004  T:  04/24/2004  Job:  [id number] cc:   Olga Millers, M.D. [facility]  Family History: Reviewed history from 09/07/2007 and no changes required. Family History of CAD Male 1st degree relative <60 Family History of CAD Male 1st degree relative <50 Family History Diabetes 1st degree relative Family History Hypertension  Social History: Reviewed history from 03/28/2008 and no changes required. Married to 2nd wife. 1st wife died of cerebral aneurysm. 5 children (all by 1st wife) Former Smoker Hx of alcohol abuse (sober x 20 yrs) Retired Arboriculturist from [employer]  Physical Exam  General:  alert, well-developed, and well-nourished.   Head:  normocephalic and atraumatic.   Eyes:  vision grossly intact.  EOMI.  PERRL.  sclerae anicteric.   Mouth:  pharynx pink and moist, no erythema, and no exudates.   Neck:  supple and no masses.   Lungs:  normal respiratory effort, no intercostal retractions, no accessory muscle use, and normal breath sounds.  no crackles and no wheezes.   Heart:  normal rate, regular rhythm Abdomen:  soft, normal bowel sounds, no guarding, and no rigidity. non-tender.   Extremities:  right AKA with prosthesis in place; +2 pitting edema in LLE. Neurologic:  alert & oriented X3.  wheel chair bound; cranial nerves II-XII intact.   Skin:  turgor normal, color normal, no  rashes, and no suspicious lesions.   Psych:  memory intact for recent and remote, normally interactive, and good eye contact.  not anxious appearing and not depressed appearing.    Diabetes Management Exam:    Foot Exam (with socks and/or shoes not present):       Sensory-Monofilament:          Left foot: abnormal   Impression & Recommendations:  Problem # 1:  DIABETES MELLITUS, TYPE II (ICD-250.00) Pt acheiving excellent control of his DM2 with his current regimen.  Will check A1c today as well as CMET to assess renal function.  His updated medication list for this problem includes:    Novolog Mix 70/30 Flexpen 70-30 % Susp (Insulin asp prot & asp (hum)) ..... Inject 40 units in the morning and 28 units in the evening as directed    Lisinopril 10 Mg Tabs (Lisinopril) .[name]... Take 1 tablet by mouth once a day  Aspirin 81 Mg Tbec (Aspirin) .[name]... Take one tablet by mouth daily  Orders: T- Capillary Blood Glucose (16109) T-Hgb A1C (in-house) (60454UJ)  Labs Reviewed: Creat: 1.38 (10/12/2009)    Reviewed HgBA1c results: 6.9 (04/12/2010)  7.2 (10/12/2009)  Problem # 2:  PELVIC PAIN, LEFT (ICD-789.09) I believe pts pain is related to the position he sits in in his motorized chair at home that places increased pressure on his left side along his belt line.  His symptoms have improved after he began using a different cushion that helps to distribute his weight more evenly in his chair and prevents his left side from rubbing against the arms of his chair.  There is no rash or symptoms present to suggest shingles and there are no other abnormalities on exam.  Advised pt to rtc if his symptoms worsen, persist for >2-3 weeks, or he develops fever, chills, muscle weakness, rash, or other concerning symptoms.  Problem # 3:  PERIPHERAL EDEMA (ICD-782.3) Pt with LE edema in his left leg - this is a chronic problem for Mr. Knauff and he is interested in options to help minimze the swelling.  He is  limited in his ability to keep his leg elevated as he is wheelchair bound most of the time.  He is also not interested in and is unwilling to use compression stockings 2/2 discomfort.  Will try a small dose of lasix to help with his symptoms.  Pt is hesistant to do this as he is very concerned about his renal function and wants to avoid all medications, etc that may potentially aggravate his renal insufficiency.  Pt is w/o SOB, DOE or other concerning finding to suggest pulmonary edema.  - WIll try lasix 20mg  p.o. once daily - Pt will return in 1 week to assess response to med and for BMET (order already placed) - If pt does not note any improvement or if his renal fxn worsens, will d/c lasix His updated medication list for this problem includes:    Furosemide 20 Mg Tabs (Furosemide) .[name]... Take 1 tablet by mouth once a day  Problem # 4:  HYPERTENSION, BENIGN ESSENTIAL (ICD-401.1) Stable.  Will add lasix as outlined above, this may help to improve pts BP further.  WIll check CMET today  His updated medication list for this problem includes:    Terazosin Hcl 10 Mg Caps (Terazosin hcl) .[name]... Take 2 capsule by mouth at bedtime daily    Norvasc 10 Mg Tabs (Amlodipine besylate) .[name]... Take 1 tablet by mouth once a day    Coreg 12.5 Mg Tabs (Carvedilol) .[name]... Take 1 tablet by mouth two times a day    Lisinopril 10 Mg Tabs (Lisinopril) .[name]... Take 1 tablet by mouth once a day    Furosemide 20 Mg Tabs (Furosemide) .[name]... Take 1 tablet by mouth once a day  Orders: T-CMP with Estimated GFR ([id number])Future Orders: T-Basic Metabolic Panel [id number]) ... 04/19/2010  Problem # 5:  RENAL INSUFFICIENCY (ICD-588.9) Pt has chronic renal insufficiency.  Inititating therapy for peripheral edema may exacerbate this - will check CMET today and repeat BMET in 1 week after starting lasix.  Please refer to plan outlined in problems 3 and 4 for additonal info  Problem # 6:  PREVENTIVE HEALTH CARE (ICD-V70.0) WIll  give annual flu vax as well as pneumo vax today.  He is UTD with Td.  Complete Medication List: 1)  Pravachol 20 Mg Tabs (Pravastatin sodium) .... Take 1 tablet by mouth once a day 2)  Accu-chek Compact Test Drum Strp (Glucose blood) 3)  Novolog Mix 70/30 Flexpen 70-30 % Susp (Insulin asp prot & asp (hum)) .... Inject 40 units in the morning and 28 units in the evening as directed 4)  Pen Needles 5/16" 31g X 8 Mm Misc (Insulin pen needle) .... To take insulin twice daily 5)  Terazosin Hcl 10 Mg Caps (Terazosin hcl) .... Take 2 capsule by mouth at bedtime daily 6)  Norvasc 10 Mg Tabs (Amlodipine besylate) .... Take 1 tablet by mouth once a day 7)  Coreg 12.5 Mg Tabs (Carvedilol) .... Take 1 tablet by mouth two times a day 8)  Lisinopril 10 Mg Tabs (Lisinopril) .... Take 1 tablet by mouth once a day 9)  Fluticasone Propionate 50 Mcg/act Susp (Fluticasone propionate) .... Use one spray in each nostril twice a day. 10)  Aspirin 81 Mg Tbec (Aspirin) .... Take one tablet by mouth daily 11)  Furosemide 20 Mg Tabs (Furosemide) .... Take 1 tablet by mouth once a day  Other Orders: Influenza Vaccine MCR (78295) Pneumococcal Vaccine (62130) Admin 1st Vaccine (86578)   Patient Instructions: 1)  Please schedule a follow-up appointment in 1 week to check your blood pressure and blood work to see how you are responding to Lasix.  It is ok to see another dr if I am not available. 2)  Lasix is the medicine to help with your lower leg swelling, use as directed.[name] 3)  Please schedule a follow-up appointment in1- 2 months with Dr. Arvilla Market. Prescriptions: FUROSEMIDE 20 MG TABS (FUROSEMIDE) Take 1 tablet by mouth once a day  #14 x 0   Entered and Authorized by:   Nelda Bucks DO   Signed by:   Nelda Bucks DO on 04/12/2010   Method used:   Electronically to        [facility] (retail)       [address]       [place], [place]  [address]       Ph: [phone]       Fax:  [phone]   RxID:   [id number]    Orders Added: 1)  T- Capillary Blood Glucose [82948] 2)  T-Hgb A1C (in-house) [83036QW] 3)  T-Basic Metabolic Panel [[id number]] 4)  Influenza Vaccine MCR [00025] 5)  Pneumococcal Vaccine [90732] 6)  Admin 1st Vaccine [90471] 7)  T-CMP with Estimated GFR [[address]] 8)  Est. Patient Level IV [41660]   Immunizations Administered:  Influenza Vaccine # 1:    Vaccine Type: Fluvax MCR    Site: left deltoid    Mfr: GlaxoSmithKline    Dose: 0.5 ml    Route: IM    Given by: Chinita Pester RN    Exp. Date: 09/21/2010    Lot #: [id number]    VIS given: 10/16/09 version given April 12, 2010.  Pneumonia Vaccine:    Vaccine Type: Pneumovax (Medicare)    Site: right deltoid    Mfr: Merck    Dose: 0.5 ml    Route: IM    Given by: Chinita Pester RN    Exp. Date: 08/16/2011    Lot #: [id number]    VIS given: 02/26/09 version given April 12, 2010.  Flu Vaccine Consent Questions:    Do you have a history of severe allergic reactions to this vaccine? no    Any prior history of allergic reactions to egg and/or gelatin? no    Do you have a sensitivity  to the preservative Thimersol? no    Do you have a past history of Guillan-Barre Syndrome? no    Do you currently have an acute febrile illness? no    Have you ever had a severe reaction to latex? no    Vaccine information given and explained to patient? yes   Immunizations Administered:  Influenza Vaccine # 1:    Vaccine Type: Fluvax MCR    Site: left deltoid    Mfr: GlaxoSmithKline    Dose: 0.5 ml    Route: IM    Given by: Chinita Pester RN    Exp. Date: 09/21/2010    Lot #: [id number]    VIS given: 10/16/09 version given April 12, 2010.  Pneumonia Vaccine:    Vaccine Type: Pneumovax (Medicare)    Site: right deltoid    Mfr: Merck    Dose: 0.5 ml    Route: IM    Given by: Chinita Pester RN    Exp. Date: 08/16/2011    Lot #: [id number]    VIS given: 02/26/09 version given April 12, 2010. Process Orders Check Orders Results:     [facility]: Check successful Tests Sent for requisitioning (April 16, 2010 8:49 AM):     04/19/2010: [facility] -- T-Basic Metabolic Panel [phone] (signed)     04/12/2010: [facility] -- T-CMP with Estimated GFR [[id number]] (signed)     Prevention & Chronic Care Immunizations   Influenza vaccine: Fluvax MCR  (04/12/2010)   Influenza vaccine deferral: Deferred  (10/12/2009)   Influenza vaccine due: 11/22/2008    Tetanus booster: 11/07/2008: Td   Td booster deferral: Deferred  (10/12/2009)    Pneumococcal vaccine: Pneumovax (Medicare)  (04/12/2010)   Pneumococcal vaccine deferral: Deferred  (10/12/2009)    H. zoster vaccine: Not documented  Colorectal Screening   Hemoccult: Not documented   Hemoccult action/deferral: Ordered  (11/07/2008)    Colonoscopy: Abnormal  (02/01/2006)   Colonoscopy action/deferral: Deferred  (10/12/2008)   Colonoscopy due: 02/02/2016  Other Screening   PSA: Not documented   PSA action/deferral: Discussed-decision deferred  (04/12/2010)   Smoking status: quit  (04/12/2010)  Diabetes Mellitus   HgbA1C: 6.9  (04/12/2010)   HgbA1C action/deferral: Ordered  (01/12/2009)    Eye exam: Not documented    Foot exam: yes  (04/12/2010)   Foot exam action/deferral: Do today   High risk foot: Yes  (10/12/2008)   Foot care education: Done  (04/12/2010)   Foot exam due: 10/12/2009    Urine microalbumin/creatinine ratio: 147.0  (01/13/2009)   Urine microalbumin action/deferral: Ordered    Diabetes flowsheet reviewed?: Yes   Progress toward A1C goal: Improved  Lipids   Total Cholesterol: 94  (08/08/2009)   LDL: 22  (08/08/2009)   LDL Direct: 42.1  (09/19/2008)   HDL: 23  (08/08/2009)   Triglycerides: 243  (08/08/2009)    SGOT (AST): 29  (10/12/2009)   BMP action: Ordered   SGPT (ALT): 23  (10/12/2009)   Alkaline phosphatase: 73   (10/12/2009)   Total bilirubin: 0.5  (10/12/2009)    Lipid flowsheet reviewed?: Yes   Progress toward LDL goal: At goal  Hypertension   Last Blood Pressure: 147 / 75  (04/12/2010)   Serum creatinine: 1.38  (10/12/2009)   BMP action: Ordered   Serum potassium 5.2  (10/12/2009)  Self-Management Support :   Personal Goals (by the next clinic visit) :     Personal A1C goal: 6  (10/12/2009)     Personal  blood pressure goal: 130/80  (10/12/2009)     Personal LDL goal: 70  (10/12/2009)    Patient will work on the following items until the next clinic visit to reach self-care goals:     Medications and monitoring: take my medicines every day, check my blood pressure, examine my feet every day  (04/12/2010)     Eating: drink diet soda or water instead of juice or soda, eat more vegetables, use fresh or frozen vegetables, eat foods that are low in salt, eat baked foods instead of fried foods, eat fruit for snacks and desserts  (04/12/2010)     Activity: take a 30 minute walk every day  (04/12/2010)     Other: Exercise inside his home/stretching at counter  (01/12/2009)    Diabetes self-management support: Written self-care plan  (04/12/2010)   Diabetes care plan printed   Last diabetes self-management training by diabetes educator: 05/06/2007   Last medical nutrition therapy: 05/06/2007    Hypertension self-management support: Written self-care plan  (04/12/2010)   Hypertension self-care plan printed.    Lipid self-management support: Written self-care plan  (04/12/2010)   Lipid self-care plan printed.   Nursing Instructions: Give Flu vaccine today Give Pneumovax today Diabetic foot exam today    Laboratory Results   Blood Tests   Date/Time Received: April 12, 2010 3:28 PM  Date/Time Reported: Burke Keels  April 12, 2010 3:28 PM   HGBA1C: 6.9%   (Normal Range: Non-Diabetic - 3-6%   Control Diabetic - 6-8%) CBG Random:: 137mg /dL

## 2010-04-25 NOTE — Progress Notes (Signed)
Summary: walk-in/ hla  Phone Note Other Incoming   Summary of Call: pt 's wife presents stating pt had low abd pain a few days ago, "not real bad", then this am feels urge to vd, hard to vd, burns, hurts. appt set w/ dr Gwenlyn Perking at 1130 Initial call taken by: Marin Roberts RN,  October 12, 2009 12:07 PM  Follow-up for Phone Call        will examine him.

## 2010-04-25 NOTE — Assessment & Plan Note (Signed)
Summary: back pain/gg   Vital Signs:  Patient profile:   65 year old male Height:      68 inches (172.72 cm) Weight:      216 pounds (86.82 kg) BMI:     29.15 Temp:     97.4 degrees F (36.33 degrees C) Pulse rate:   68 / minute BP sitting:   117 / 70  (left arm) Cuff size:   regular  Vitals Entered By: Theotis Barrio NT II (Aug 08, 2009 11:44 AM) CC: PATIENT  FELL  / BACK PAIN SINCE FALL Is Patient Diabetic? Yes Did you bring your meter with you today? Yes Pain Assessment Patient in pain? yes     Location: back Intensity:     10 Type: sharp Onset of pain   MONDAY AFTER EASTER Nutritional Status BMI of 25 - 29 = overweight  Have you ever been in a relationship where you felt threatened, hurt or afraid?No   Does patient need assistance? Functional Status Self care Ambulation Normal Comments PATIENT FELL THE MONDAY AFTER EASTER   Primary Care Provider:  Nelda Bucks DO  CC:  PATIENT  FELL  / BACK PAIN SINCE FALL.  History of Present Illness: 65 yr old AAM with PMH as mentioned in the EMR comes to the office for the following  1. Patient had an accidental fall due to lack of balance about 3 weeks ago. Patient fell on his back, denies hitting his head. 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 Patient uses the power wheel chair for a long time now. Patient complains of lower back pain since the fall. The pain is non-radiating, denies any tingling or numbness or weakness. Aggravated by movements of his back and decreased slightly by tylenol/ ibuprofen.  He denies any other complaints.  Preventive Screening-Counseling & Management  Alcohol-Tobacco     Smoking Status: quit     Year Quit: 1988  Caffeine-Diet-Exercise     Does Patient Exercise: no  Problems Prior to Update: 1)  Rhinitis  (ICD-472.0) 2)  Dry Skin  (ICD-701.1) 3)  Constipation, Intermittent  (ICD-564.00) 4)  Renal Insufficiency  (ICD-588.9) 5)  Hypertension, Benign Essential   (ICD-401.1) 6)  Peripheral Vascular Disease  (ICD-443.9) 7)  Cerebrovascular Accident, Hx of  (ICD-V12.50) 8)  Hyperlipidemia  (ICD-272.4) 9)  Coronary Artery Disease  (ICD-414.00) 10)  Chest Pain  (ICD-786.50) 11)  Flank Pain, Right  (ICD-789.09) 12)  Encounter For Long-term Use of Other Medications  (ICD-V58.69) 13)  Degenerative Joint Disease, Fingers  (ICD-715.94) 14)  Renal Failure, Acute  (ICD-584.9) 15)  Hyperkalemia  (ICD-276.7) 16)  Erectile Dysfunction, Organic  (ICD-607.84) 17)  Gerd  (ICD-530.81) 18)  Hypergammaglobulinemia, 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 Take 1 Tablet By Mouth Once A Day 2)  Plavix 75 Mg Tabs (Clopidogrel Bisulfate) .... Take 1 Tablet By Mouth Once A Day 3)  Accu-Chek Compact Test Drum  Strp (Glucose Blood) 4)  Novolog Mix  70/30 Flexpen 70-30 % Susp (Insulin Asp Prot & Asp (Hum)) .... Inject 38 Units in The Morning and 26 Units in The Evening As Directed 5)  Pen Needles 5/16" 31g X 8 Mm Misc (Insulin Pen Needle) .... To Take Insulin Twice Daily 6)  Hytrin 10 Mg  Caps (Terazosin Hcl) .... Take 1 Tablet By Mouth At Bedtime 7)  Norvasc 10 Mg Tabs (Amlodipine Besylate) .... Take 1 Tablet By Mouth Once A Day 8)  Coreg 12.5 Mg  Tabs (Carvedilol) .... Take 1 Tablet By Mouth Two Times A Day 9)  Lisinopril 10 Mg Tabs (Lisinopril) .... Take 1 Tablet By Mouth Once A Day 10)  Famotidine 40 Mg Tabs (Famotidine) .... Take 1 Tablet By Mouth At  Bedtime 11)  Benadryl 25 Mg Tabs (Diphenhydramine Hcl) .... As Needed 12)  Fluticasone Propionate 50 Mcg/act Susp (Fluticasone Propionate) .... Use One Spray in Each Nostril Twice A Day. 13)  Loratadine 10 Mg Tabs (Loratadine) .... Take 1 Pill By Mouth Once Daily As Needed For Allergy Symptoms  Current Medications (verified): 1)  Pravachol 20 Mg Tabs (Pravastatin Sodium) .... Take 1 Tablet By Mouth Once A Day 2)  Plavix 75 Mg Tabs (Clopidogrel Bisulfate) .... Take 1 Tablet By Mouth Once A Day 3)  Accu-Chek Compact Test Drum  Strp (Glucose Blood) 4)  Novolog Mix 70/30 Flexpen 70-30 % Susp (Insulin Asp Prot & Asp (Hum)) .... Inject 38 Units in The Morning and 26 Units in The Evening As Directed 5)  Pen Needles 5/16" 31g X 8 Mm Misc (Insulin Pen Needle) .... To Take Insulin Twice Daily 6)  Hytrin 10 Mg  Caps (Terazosin Hcl) .... Take 1 Tablet By Mouth At Bedtime 7)  Norvasc 10 Mg Tabs (Amlodipine Besylate) .... Take 1 Tablet By Mouth Once A Day 8)  Coreg 12.5 Mg  Tabs (Carvedilol) .... Take 1 Tablet By Mouth Two Times A Day 9)  Lisinopril 10 Mg Tabs (Lisinopril) .... Take 1 Tablet By Mouth Once A Day 10)  Famotidine 40 Mg Tabs (Famotidine) .... Take 1 Tablet By Mouth At Bedtime 11)  Benadryl 25 Mg Tabs (Diphenhydramine Hcl) .... As Needed 12)  Fluticasone Propionate 50 Mcg/act Susp (Fluticasone Propionate) .... Use One Spray in Each Nostril Twice A Day. 13)  Loratadine 10 Mg Tabs (Loratadine) .... Take 1 Pill By Mouth Once Daily As Needed For Allergy Symptoms  Allergies (verified): 1)  Flomax  Past History:  Family History: Last updated: 09/07/2007 Family History of CAD Male 1st degree relative <60 Family History of CAD Male 1st degree relative <50 Family History Diabetes 1st degree relative Family History Hypertension  Social History: Last updated: 03/28/2008 Married to 2nd wife. 1st wife died of cerebral aneurysm. 5 children (all by 1st wife) Former Smoker Hx of alcohol abuse  (sober x 20 yrs) Retired Arboriculturist from [employer]  Risk Factors: Exercise: no (08/08/2009)  Risk Factors: Smoking Status: quit (08/08/2009)  Past Medical History: Reviewed history from 06/15/2009 and no changes required. Current Problems:  HYPERTENSION, BENIGN ESSENTIAL (ICD-401.1) PERIPHERAL VASCULAR DISEASE (ICD-443.9)s/p R. 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 Above knee amputation-right PTCA/stent Colon polypectomy-Nov 07 Rectal polyp   Left heart catheterization with coronary angiography and left ventriculography.Percutaneous coronary intervention with high-speed rotational atherectomy followed by placement of two bare metal stents in the mid  right coronary. Temporary pacemaker placement right in the right ventricle.RESULTS:  Hemodynamics:  Left ventricular pressure 140/12, aortic pressure 140/82. There is no aortic valve gradient. Left ventriculogram:  There is very mild akinesis of anterolateral wall as well as the inferior apical wall. Otherwise wall motion is normal. Ejection fraction estimated at 60%. There is trace mitral regurgitation. MWP/MEDQ  D:  04/24/2004  T:  04/24/2004   Job:  [id number] cc:   Olga Millers, M.D. [facility]  Family History: Reviewed history from 09/07/2007 and no changes required. Family History of CAD Male 1st degree relative <60 Family History of CAD Male 1st degree relative <50 Family History Diabetes 1st degree relative Family History Hypertension  Social History: Reviewed history from 03/28/2008 and no changes required. Married to 2nd wife. 1st wife died of cerebral aneurysm. 5 children (all by 1st wife) Former Smoker Hx of alcohol abuse (sober x 20 yrs) Retired Arboriculturist from [employer]  Review of Systems      See HPI  Physical Exam  General:  alert, well-developed, and well-nourished. sitting in wheel chair, right AKA with prosthesis. Head:  normocephalic and atraumatic.   Eyes:  vision grossly intact.   Neck:  supple and full ROM.   Lungs:  normal respiratory effort, no intercostal retractions, no accessory muscle use, and normal breath sounds.   Heart:  normal rate, regular rhythm, no murmur, and no gallop.   Abdomen:  soft and non-tender.   Msk:  Mild TTP over the lateral aspect of lumbar area, bilaterally. No TTP over the midline. Could not able to perform the straight leg raising test. Pulses:  R radial normal.   Extremities:  right AKA with prosthesis.  Neurologic:  alert & oriented X3.  wheel chair bound   Impression & Recommendations:  Problem # 1:  FALL, HX OF (ICD-V15.88)  3 weeks ago, secondary to lack of balance (patient has right AKA with prosthesis). No alarming symptoms. Suspect secondary to muscle bruising. Given persistent back pain, will get an Xray of his back to rule out fractures. Will treat him with tramadol for pain given that tylenol and ibuprofen is not helping him.  Orders: T-DG Lumbar Spine Complete ([id number]) T-DG Hip Bilateral w/Pelvis ([id number])  Problem # 2:  DIABETES MELLITUS, TYPE II (ICD-250.00) HbA1C trending up. Patient reports that he hasnt been watching his diet recently. His  fasting CBGs and afternoon/evening cbg's have all been running high. Will increase his insulin to 40/28. Will follow up in 2 months. Also recommended to watch his diet and cut down on his carbohydrates.  His updated medication list for this problem includes:    Novolog Mix 70/30 Flexpen 70-30 % Susp (Insulin asp prot & asp (hum)) ..... Inject 40 units in the morning and 28 units in the evening as directed    Lisinopril 10 Mg Tabs (Lisinopril) .[name]... Take 1 tablet by mouth once a day  Labs Reviewed: Creat: 1.22 (05/25/2009)    Reviewed HgBA1c results: 7.4 (05/25/2009)  6.7 (01/12/2009)  Problem # 3:  HYPERTENSION, BENIGN ESSENTIAL (ICD-401.1)  Well controlled. Continue current regimen.  His updated medication list for this problem includes:  Hytrin 10 Mg Caps (Terazosin hcl) .[name]... Take 1 tablet by mouth at bedtime    Norvasc 10 Mg Tabs (Amlodipine besylate) .[name]... Take 1 tablet by mouth once a day    Coreg 12.5 Mg Tabs (Carvedilol) .[name]... Take 1 tablet by mouth two times a day    Lisinopril 10 Mg Tabs (Lisinopril) .[name]... Take 1 tablet by mouth once a day  BP today: 117/70 Prior BP: 130/80 (06/15/2009)  Labs Reviewed: K+: 4.8 (05/25/2009) Creat: : 1.22 (05/25/2009)   Chol: 100 (09/19/2008)   HDL: 23.90 (09/19/2008)   LDL: 42 (09/19/2008)   TG: 267.0 (09/19/2008)  Orders: T-CBC w/Diff ([id number]) T-Comprehensive Metabolic Panel ([id number])  Problem # 4:  CORONARY ARTERY DISEASE (ICD-414.00)  Stable. No active issues. Continue current regimen.  His updated medication list for this problem includes:    Plavix 75 Mg Tabs (Clopidogrel bisulfate) .[name]... Take 1 tablet by mouth once a day    Hytrin 10 Mg Caps (Terazosin hcl) .[name]... Take 1 tablet by mouth at bedtime    Norvasc 10 Mg Tabs (Amlodipine besylate) .[name]... Take 1 tablet by mouth once a day    Coreg 12.5 Mg Tabs (Carvedilol) .[name]... Take 1 tablet by mouth two times a day    Lisinopril 10 Mg Tabs (Lisinopril) .[name]... Take 1 tablet by  mouth once a day  Orders: T-CBC w/Diff ([id number]) T-Comprehensive Metabolic Panel ([id number])  Complete Medication List: 1)  Pravachol 20 Mg Tabs (Pravastatin sodium) .... Take 1 tablet by mouth once a day 2)  Plavix 75 Mg Tabs (Clopidogrel bisulfate) .... Take 1 tablet by mouth once a day 3)  Accu-chek Compact Test Drum Strp (Glucose blood) 4)  Novolog Mix 70/30 Flexpen 70-30 % Susp (Insulin asp prot & asp (hum)) .... Inject 40 units in the morning and 28 units in the evening as directed 5)  Pen Needles 5/16" 31g X 8 Mm Misc (Insulin pen needle) .... To take insulin twice daily 6)  Hytrin 10 Mg Caps (Terazosin hcl) .... Take 1 tablet by mouth at bedtime 7)  Norvasc 10 Mg Tabs (Amlodipine besylate) .... Take 1 tablet by mouth once a day 8)  Coreg 12.5 Mg Tabs (Carvedilol) .... Take 1 tablet by mouth two times a day 9)  Lisinopril 10 Mg Tabs (Lisinopril) .... Take 1 tablet by mouth once a day 10)  Famotidine 40 Mg Tabs (Famotidine) .... Take 1 tablet by mouth at bedtime 11)  Benadryl 25 Mg Tabs (Diphenhydramine hcl) .... As needed 12)  Fluticasone Propionate 50 Mcg/act Susp (Fluticasone propionate) .... Use one spray in each nostril twice a day. 13)  Loratadine 10 Mg Tabs (Loratadine) .... Take 1 pill by mouth once daily as needed for allergy symptoms 14)  Tramadol Hcl 50 Mg Tabs (Tramadol hcl) .... Take 1 tablet by mouth three times a day as needed for pain  Other Orders: T-Lipid Profile ([id number])  Patient Instructions: 1)  Please schedule a follow-up appointment in 2 months. 2)  Take all the medications as advised below. Prescriptions: PRAVACHOL 20 MG TABS (PRAVASTATIN SODIUM) Take 1 tablet by mouth once a day  #90 x 11   Entered and Authorized by:   Blondell Reveal MD   Signed by:   Blondell Reveal MD on 08/08/2009   Method used:   Electronically to        [facility] (retail)       [address]       [place]  [place], [place]  [address]       Ph:  [phone]       Fax: [phone]   RxID:   [id number] TRAMADOL HCL 50 MG TABS (TRAMADOL HCL) Take 1 tablet by mouth three times a day as needed for pain  #90 x 0   Entered and Authorized by:   Blondell Reveal MD   Signed by:   Blondell Reveal MD on 08/08/2009   Method used:   Electronically to        [facility] (retail)       [address]       [place], [place]  [address]       Ph: [phone]       Fax: [phone]   RxID:   [id number]  Process Orders Check Orders Results:     [facility]: Check successful Tests Sent for requisitioning (Aug 08, 2009 12:31 PM):     08/08/2009: [facility] -- T-CBC w/Diff [[id number]] (signed)     08/08/2009: [facility] -- T-Comprehensive Metabolic Panel [[id number]] (signed)     08/08/2009: [facility] -- T-Lipid Profile [phone] (signed)    Prevention & Chronic Care Immunizations   Influenza vaccine: Fluvax 3+  (01/12/2009)   Influenza vaccine deferral: Deferred  (10/12/2008)   Influenza vaccine due: 11/22/2008    Tetanus booster: 11/07/2008: Td   Td booster deferral: Not indicated  (01/12/2009)    Pneumococcal vaccine: Not documented   Pneumococcal vaccine deferral: Deferred  (10/12/2008)    H. zoster vaccine: Not documented  Colorectal Screening   Hemoccult: Not documented   Hemoccult action/deferral: Ordered  (11/07/2008)    Colonoscopy: Abnormal  (02/01/2006)   Colonoscopy action/deferral: Deferred  (10/12/2008)   Colonoscopy due: 02/02/2016  Other Screening   PSA: Not documented   PSA action/deferral: Discussion deferred  (05/25/2009)   Smoking status: quit  (08/08/2009)  Diabetes Mellitus   HgbA1C: 7.4  (05/25/2009)   HgbA1C action/deferral: Ordered  (01/12/2009)    Eye exam: Not documented    Foot exam: yes  (10/12/2008)   Foot exam action/deferral: Do today   High risk foot: Yes  (10/12/2008)   Foot  care education: Done  (10/12/2008)   Foot exam due: 10/12/2009    Urine microalbumin/creatinine ratio: 147.0  (01/13/2009)   Urine microalbumin action/deferral: Ordered  Lipids   Total Cholesterol: 100  (09/19/2008)   LDL: 42  (09/19/2008)   LDL Direct: 42.1  (09/19/2008)   HDL: 23.90  (09/19/2008)   Triglycerides: 267.0  (09/19/2008)    SGOT (AST): 32  (05/25/2009)   SGPT (ALT): 26  (05/25/2009) CMP ordered    Alkaline phosphatase: 66  (05/25/2009)   Total bilirubin: 0.4  (05/25/2009)  Hypertension   Last Blood Pressure: 117 / 70  (08/08/2009)   Serum creatinine: 1.22  (05/25/2009)   Serum potassium 4.8  (05/25/2009) CMP ordered   Self-Management Support :   Personal Goals (by the next clinic visit) :     Personal A1C goal: 7  (08/08/2009)     Personal blood pressure goal: 140/90  (08/08/2009)     Personal LDL goal: 100  (08/08/2009)    Patient will work on the following items until the next clinic visit to reach self-care goals:     Medications and monitoring: take my medicines every day, check my blood sugar, bring all of my medications to every visit, examine my feet every day  (08/08/2009)  Eating: drink diet soda or water instead of juice or soda, eat more vegetables, use fresh or frozen vegetables, eat foods that are low in salt, eat baked foods instead of fried foods, eat fruit for snacks and desserts, limit or avoid alcohol  (08/08/2009)     Activity: take a 30 minute walk every day  (05/25/2009)     Other: Exercise inside his home/stretching at counter  (01/12/2009)    Diabetes self-management support: Resources for patients handout  (08/08/2009)   Last diabetes self-management training by diabetes educator: 05/06/2007   Last medical nutrition therapy: 05/06/2007    Hypertension self-management support: Resources for patients handout  (08/08/2009)    Lipid self-management support: Resources for patients handout  (08/08/2009)     Self-management comments:  PATIENT IS UNABLE TO WALK FOR EXERCISE      Resource handout printed.

## 2010-04-25 NOTE — Progress Notes (Signed)
Summary: phone/gg  Phone Note Call from Patient   Caller: Spouse Summary of Call: Pt wife called in stating pt was outside and lost balance and fell. ( 5/2 )  Now back still hurting, low back. He has used icy hot and tylenol. He is ambulating but with pain.  rates pain 10/10  will see today Initial call taken by: Merrie Roof RN,  Aug 08, 2009 10:00 AM

## 2010-04-26 ENCOUNTER — Other Ambulatory Visit: Payer: Self-pay | Admitting: Internal Medicine

## 2010-04-26 DIAGNOSIS — I10 Essential (primary) hypertension: Secondary | ICD-10-CM

## 2010-05-01 NOTE — Progress Notes (Signed)
Summary: flank pain/ hla  Phone Note Call from Patient   Summary of Call: pt's wife calls to say pt is worse than he was at last visit, pain has intensified and cannot sleep, he is referred to urg care due to lack of available appts.   Initial call taken by: Marin Roberts RN,  April 22, 2010 2:38 PM

## 2010-06-08 LAB — GLUCOSE, CAPILLARY: Glucose-Capillary: 67 mg/dL — ABNORMAL LOW (ref 70–99)

## 2010-06-12 ENCOUNTER — Other Ambulatory Visit (INDEPENDENT_AMBULATORY_CARE_PROVIDER_SITE_OTHER): Payer: BC Managed Care – PPO | Admitting: *Deleted

## 2010-06-12 DIAGNOSIS — E785 Hyperlipidemia, unspecified: Secondary | ICD-10-CM

## 2010-06-13 MED ORDER — CARVEDILOL 12.5 MG PO TABS: 12.5000 mg | ORAL_TABLET | Freq: Two times a day (BID) | ORAL | Status: DC

## 2010-06-14 ENCOUNTER — Ambulatory Visit (INDEPENDENT_AMBULATORY_CARE_PROVIDER_SITE_OTHER): Payer: Medicare Other | Admitting: Internal Medicine

## 2010-06-14 ENCOUNTER — Encounter: Payer: Self-pay | Admitting: Internal Medicine

## 2010-06-14 VITALS — BP 143/82 | HR 73 | Temp 99.0°F | Ht 68.0 in | Wt 220.8 lb

## 2010-06-14 DIAGNOSIS — E119 Type 2 diabetes mellitus without complications: Secondary | ICD-10-CM

## 2010-06-14 DIAGNOSIS — J309 Allergic rhinitis, unspecified: Secondary | ICD-10-CM

## 2010-06-14 DIAGNOSIS — I10 Essential (primary) hypertension: Secondary | ICD-10-CM

## 2010-06-14 DIAGNOSIS — R609 Edema, unspecified: Secondary | ICD-10-CM

## 2010-06-14 DIAGNOSIS — E785 Hyperlipidemia, unspecified: Secondary | ICD-10-CM

## 2010-06-14 DIAGNOSIS — R600 Localized edema: Secondary | ICD-10-CM

## 2010-06-14 DIAGNOSIS — J302 Other seasonal allergic rhinitis: Secondary | ICD-10-CM

## 2010-06-14 LAB — COMPREHENSIVE METABOLIC PANEL
ALT: 36 U/L (ref 0–53)
AST: 48 U/L — ABNORMAL HIGH (ref 0–37)
CO2: 18 mEq/L — ABNORMAL LOW (ref 19–32)
Calcium: 8.9 mg/dL (ref 8.4–10.5)
Chloride: 109 mEq/L (ref 96–112)
Creat: 1.45 mg/dL (ref 0.40–1.50)
Sodium: 138 mEq/L (ref 135–145)
Total Bilirubin: 0.4 mg/dL (ref 0.3–1.2)
Total Protein: 7.7 g/dL (ref 6.0–8.3)

## 2010-06-14 LAB — POCT GLYCOSYLATED HEMOGLOBIN (HGB A1C): Hemoglobin A1C: 7.3

## 2010-06-14 LAB — LIPID PANEL
HDL: 21 mg/dL — ABNORMAL LOW (ref >39)
Total CHOL/HDL Ratio: 4 Ratio

## 2010-06-14 MED ORDER — LISINOPRIL 10 MG PO TABS: 10.0000 mg | ORAL_TABLET | Freq: Every day | ORAL | Status: DC

## 2010-06-14 MED ORDER — FUROSEMIDE 20 MG PO TABS: 20.0000 mg | ORAL_TABLET | Freq: Every day | ORAL | Status: DC

## 2010-06-14 MED ORDER — FLUTICASONE PROPIONATE 50 MCG/ACT NA SUSP: 2.0000 | Freq: Every day | NASAL | Status: DC

## 2010-06-14 NOTE — Patient Instructions (Signed)
Schedule a follow up appointment in 3 months, or sooner if needed, with Dr. Arvilla Market. Keep taking your medicines as directed. Lasix is the pill to help your lower leg swelling. Keep up the great work! I look forward to seeing pics of your yard this summer!

## 2010-06-14 NOTE — Assessment & Plan Note (Signed)
Blood pressure is currently  well controlled with lisinopril.   Will obtain any metabolic panel today to assess his renal function and electrolyte status.

## 2010-06-14 NOTE — Assessment & Plan Note (Signed)
Mr. Fullwood is doing very well managing his diabetes. He is not experiencing any hypoglycemic episodes episodes or persistent hyperglycemia. Will check hemoglobin A1c today. Discussed the need for annual microalbumin creatinine ratio. This may have recently been checked by his urologist. We will request his records today and if microalbumin creatinine ratio has not been checked we will obtain this at his next visit.   He is also due for referral for his annual diabetic eye exam and is up-to-date on his annual foot exams.

## 2010-06-14 NOTE — Progress Notes (Signed)
Subjective:    Patient ID: [name] [name], male    DOB: 03/11/1946, 65 y.o.   MRN: [id number]  HPI   [name] [name] is a pleasant 65 year old male with past medical history outlined in PMR. He presents today for routine followup of his chronic medical conditions and medication refill.  Pt reports recent uti treated by [name] [name] (urology).   He describes having a bladder scan oOr possible renal ultrasound performed at that time to evaluate for potential obstruction or other abnormality and states that this was normal.   He is currently not experiencing any symptoms increased frequency dysuria or other problem.   Hypertension: patient reports compliance with his current medication regimen. He does not describe any side effects from his medications.   Diabetes: patient denies any symptoms of hypoglycemia. Review his glucometer log reveals CBGs ranging from 100 to a single high as 238.    Lower extremity edema: patient's reports significant improvement in his lower leg swelling. He feels that the trial of Lasix was beneficial however his does Achilles take this medication regularly as he only a beer intermittently experiences lower leg edema. He states he would like to continue this medication as needed for his lower leg swelling. He denies any adverse effects with the Lasix   Hip pain:   [name] was experiencing pain in his left hip at his last office visit in January. It was felt that this may  be related to the way he was sitting in his wheelchair  in addition to underlying arthritis. He notes that his left hip pain is significantly improved.    Review of Systems  Constitutional: Negative for diaphoresis, activity change, appetite change and fatigue.  HENT: Negative for facial swelling and neck pain.   Eyes: Negative for discharge.  Respiratory: Negative for apnea, chest tightness and shortness of breath.   Cardiovascular: Negative for chest pain.  Genitourinary: Negative for dysuria and  difficulty urinating.  Musculoskeletal: Negative for arthralgias.  Neurological: Negative for dizziness, syncope, light-headedness and numbness.   12 point review of systems was completed and he is outlined in history of present illness.   All other systems reviewed and negative.       Objective:   Physical Exam  Constitutional: He is oriented to person, place, and time. He appears well-developed. No distress.  HENT:  Head: Normocephalic and atraumatic.  Nose: Nose normal.  Mouth/Throat: Oropharynx is clear and moist. No oropharyngeal exudate.  Eyes: Conjunctivae are normal. Pupils are equal, round, and reactive to light. Right eye exhibits no discharge. Left eye exhibits no discharge. No scleral icterus.  Neck: Normal range of motion. Neck supple. No JVD present. No tracheal deviation present.  Cardiovascular: Normal rate, regular rhythm and normal heart sounds.  Exam reveals no gallop and no friction rub.   No murmur heard. Pulmonary/Chest: Effort normal and breath sounds normal. No stridor. No respiratory distress. He has no wheezes. He has no rales.  Abdominal: Soft. Bowel sounds are normal. He exhibits no distension and no mass. There is no tenderness. There is no rebound and no guarding.  Lymphadenopathy:    He has no cervical adenopathy.  Neurological: He is alert and oriented to person, place, and time. No cranial nerve deficit. Coordination normal.  Skin: Skin is warm and dry. No rash noted. He is not diaphoretic. No erythema. No pallor.  Psychiatric: He has a normal mood and affect. His behavior is normal. Judgment and thought content normal.   musculoskeletal:  Right  AKA noted. 1+ pitting edema and left lower extremity        Assessment & Plan:

## 2010-06-14 NOTE — Assessment & Plan Note (Signed)
Mr. Lader felt his symptoms were significantly improved with low-dose Lasix. He did not experience any worsening renal function while on Lasix. Will continue this on a prn necessary basis for his lower extremity edema. Patient is also advised to elevate his leg as much as possible at home.

## 2010-06-14 NOTE — Assessment & Plan Note (Signed)
Stable.   Patient has not eaten in approximately 10 hours will check a fasting lipid panel today.

## 2010-06-17 ENCOUNTER — Encounter: Payer: Self-pay | Admitting: Internal Medicine

## 2010-06-17 ENCOUNTER — Ambulatory Visit (INDEPENDENT_AMBULATORY_CARE_PROVIDER_SITE_OTHER): Payer: Medicare Other | Admitting: Internal Medicine

## 2010-06-17 ENCOUNTER — Ambulatory Visit (HOSPITAL_COMMUNITY): Payer: Medicare Other | Attending: Internal Medicine

## 2010-06-17 ENCOUNTER — Other Ambulatory Visit: Payer: Self-pay | Admitting: Internal Medicine

## 2010-06-17 ENCOUNTER — Inpatient Hospital Stay (HOSPITAL_COMMUNITY)
Admission: RE | Admit: 2010-06-17 | Discharge: 2010-06-17 | Disposition: A | Discharge status: Home / Self Care | Payer: Medicare Other | Source: Ambulatory Visit

## 2010-06-17 VITALS — BP 131/75 | HR 71 | Temp 97.6°F | Ht 68.0 in | Wt 220.0 lb

## 2010-06-17 DIAGNOSIS — I10 Essential (primary) hypertension: Secondary | ICD-10-CM | POA: Insufficient documentation

## 2010-06-17 DIAGNOSIS — E875 Hyperkalemia: Secondary | ICD-10-CM

## 2010-06-17 DIAGNOSIS — R600 Localized edema: Secondary | ICD-10-CM

## 2010-06-17 DIAGNOSIS — R609 Edema, unspecified: Secondary | ICD-10-CM

## 2010-06-17 DIAGNOSIS — E119 Type 2 diabetes mellitus without complications: Secondary | ICD-10-CM

## 2010-06-17 DIAGNOSIS — M7989 Other specified soft tissue disorders: Secondary | ICD-10-CM | POA: Insufficient documentation

## 2010-06-17 LAB — BASIC METABOLIC PANEL WITH GFR
BUN: 32 mg/dL — ABNORMAL HIGH (ref 6–23)
CO2: 21 mEq/L (ref 19–32)
Calcium: 8.2 mg/dL — ABNORMAL LOW (ref 8.4–10.5)
Chloride: 112 mEq/L (ref 96–112)
Creat: 1.32 mg/dL (ref 0.40–1.50)
GFR, Est African American: >60 mL/min (ref >60)
Glucose, Bld: 210 mg/dL — ABNORMAL HIGH (ref 70–99)
Sodium: 136 mEq/L (ref 135–145)

## 2010-06-17 LAB — POCT GLYCOSYLATED HEMOGLOBIN (HGB A1C): Hemoglobin A1C: 6.9

## 2010-06-17 MED ORDER — FUROSEMIDE 20 MG PO TABS: 20.0000 mg | ORAL_TABLET | Freq: Every day | ORAL | Status: DC

## 2010-06-17 NOTE — Progress Notes (Signed)
Pt was called and an appt scheduled per Chilon; he will come in this morning and see Dr. Threasa Beards.

## 2010-06-17 NOTE — Patient Instructions (Signed)
Please take all your medications as instructed in your instructions.   Please bring your glucometer with you at next visit.

## 2010-06-17 NOTE — Assessment & Plan Note (Addendum)
He has previous K up to 5.5 and aslo has CKD with baseline Cr 1.4. His K 5.4 2 days ago may be due to intake from banana or his CKD. We rechecked BMET and K4.8 . Will continue current dose of lisnopril and restart lasix. He will continue to his scheduled appointment.

## 2010-06-17 NOTE — Progress Notes (Signed)
  Subjective:    Patient ID: [name] [name], male    DOB: 05-01-1945, 65 y.o.   MRN: [id number]  HPI Patient is a 65 years old malewith past medical history  as outlined here who comes to the Clinic for abnormal lab of K 5.4. Last Friday her PCP checked her CMET and found K 5.4. He said that he has been eating more banana than before and has not taken lasix since January. He has no C/O, including fever, chill, chest pain, shortness of breath, hemoptysis, abdominal pain, nausea, vomiting, diarrhea, melena, dysuria, significant weight change. Denies recent smoking, alcohol or drug abuse. Has been taking all his medications as instructed.    Review of Systems Per HPI.  Current Outpatient Medications Current Outpatient Prescriptions  Medication Sig Dispense Refill  . carvedilol (COREG) 12.5 MG tablet Take 1 tablet (12.5 mg total) by mouth 2 (two) times daily.  60 tablet  3  . fluticasone (FLONASE) 50 MCG/ACT nasal spray 2 sprays by Nasal route daily.  16 g  3  . lisinopril (PRINIVIL,ZESTRIL) 10 MG tablet Take 1 tablet (10 mg total) by mouth daily.  30 tablet  6  . loratadine (CLARITIN) 10 MG tablet Take 10 mg by mouth daily.        . furosemide (LASIX) 20 MG tablet Take 1 tablet (20 mg total) by mouth daily.  30 tablet  6  . DISCONTD: furosemide (LASIX) 20 MG tablet Take 20 mg by mouth daily. For lower extremity swelling         Allergies Tamsulosin  No past medical history on file.  No past surgical history on file.     Objective:   Physical Exam General: Vital signs reviewed and noted. Well-developed,well-nourished,in no acute distress; alert,appropriate and cooperative throughout examination. Head: normocephalic, atraumatic. Neck: No deformities, masses, or tenderness noted. Lungs: Normal respiratory effort. Clear to auscultation BL without crackles or wheezes.  Heart: RRR. S1 and S2 normal without gallop, murmur, or rubs.  Abdomen: BS normoactive. Soft, Nondistended, non-tender.  No  masses or organomegaly. Extremities: No pretibial edema. Right AKA         Assessment & Plan:

## 2010-06-26 ENCOUNTER — Other Ambulatory Visit (INDEPENDENT_AMBULATORY_CARE_PROVIDER_SITE_OTHER): Payer: Medicare Other | Admitting: *Deleted

## 2010-06-26 DIAGNOSIS — E119 Type 2 diabetes mellitus without complications: Secondary | ICD-10-CM

## 2010-06-26 MED ORDER — INSULIN ASPART PROT & ASPART (70-30 MIX) 100 UNIT/ML ~~LOC~~ SUSP: SUBCUTANEOUS | Status: DC

## 2010-06-26 NOTE — Telephone Encounter (Signed)
Request os for Novolog 70/30 Flex Pen 53ml/Pen5/Box.   Please check if this is correct.

## 2010-07-01 ENCOUNTER — Other Ambulatory Visit: Payer: Self-pay | Admitting: Internal Medicine

## 2010-07-01 ENCOUNTER — Telehealth: Payer: Self-pay | Admitting: *Deleted

## 2010-07-01 MED ORDER — INSULIN ASPART PROT & ASPART (70-30 MIX) 100 UNIT/ML ~~LOC~~ SUSP: SUBCUTANEOUS | Status: DC

## 2010-07-01 NOTE — Telephone Encounter (Signed)
[facility] pt needs qty of 30ml or 2 boxes of Novolog 70/30 Pen.

## 2010-07-01 NOTE — Telephone Encounter (Signed)
I entered a new order but was unable to enter dispense 2 boxes, the only option was to specify mL.  I sent a new script with 30mL.

## 2010-07-02 NOTE — Telephone Encounter (Signed)
Rx called to [facility].

## 2010-07-20 ENCOUNTER — Inpatient Hospital Stay (INDEPENDENT_AMBULATORY_CARE_PROVIDER_SITE_OTHER)
Admission: RE | Admit: 2010-07-20 | Discharge: 2010-07-20 | Disposition: A | Discharge status: Home / Self Care | Payer: Medicare Other | Source: Ambulatory Visit | Attending: Family Medicine | Admitting: Family Medicine

## 2010-07-20 DIAGNOSIS — K047 Periapical abscess without sinus: Secondary | ICD-10-CM

## 2010-08-06 NOTE — Assessment & Plan Note (Signed)
[facility]   NAME:  [name] [name], [name] [name] NO.:  [id number]   MEDICAL RECORD NO.:  [id number]      DATE OF BIRTH:  21-Mar-1946   PHYSICIAN:  Theresia Majors. Tanda Rockers, M.D. VISIT DATE:  11/16/2006                                   OFFICE VISIT   SUBJECTIVE:  [name] [name] is a 65 year old man who returns for followup  of a Wagner grade 3 diabetic foot ulcer involving the left lower  extremity.  In the interim his wife has continued to wash the wound with  antiseptic soap.  He has continued offloading with a Cam walker and a  bulky dressing.  There has been no drainage, no malodor, and no fever.   OBJECTIVE:  Blood pressure is 120/70, respirations 16, pulse rate 71,  temperature 98.1.  Inspection of the foot shows that there is continued contraction.  There  is a crescent wound now in the area of the previous open wound.  It  measures miniscule, approximately 1centimeter in length and 2 mm in  diameter, 3 mm in depth.  There is scant drainage at best.  The  periwound tissue appears to be normal.  There is no associated edema.  There is no pain or evidence of infection.   ASSESSMENT:  Clinical improvement.   PLAN:  We will continue the daily antiseptic soap washes, will use a  cotton sock as a dressing.  We will continue the Cam walker for  offloading and reevaluate the patient in 2 weeks.  His wife will  continue to cleanse the wound and will inspect the wound daily.  If  there is any evidence of breakdown she will call the office for an  interim appointment.  They both seem to understand and express gratitude  for having been seen in the clinic, and indicate that they will be  compliant.      Harold A. Tanda Rockers, M.D.  Electronically Signed     HAN/MEDQ  D:  11/16/2006  T:  11/17/2006  Job:  [id number]

## 2010-08-06 NOTE — Assessment & Plan Note (Signed)
[facility]   NAME:  [name] [name], [name] [name] NO.:  [id number]   MEDICAL RECORD NO.:  [id number]      DATE OF BIRTH:  May 17, 1945   PHYSICIAN:  Theresia Majors. Tanda Rockers, M.D. VISIT DATE:  09/16/2006                                   OFFICE VISIT   SUBJECTIVE:  [name] [name] is a 65 year old man who has undergone  hyperbaric oxygen treatment for Wagner grade 3 diabetic foot ulcer.  Most recently he was reevaluated after a local injury to the wound due  to inadequate offloading and the patient's overindulgence at ambulation.  The wound was debrided and we have continued his hyperbaric oxygen  treatment in anticipation that the TCOM would improve in the wound  character will improve there has been no interim malodor.  There is been  no excessive drainage.  He remains on antibiotics and there has been no  significant pain.   OBJECTIVE:  Blood pressure is 103/62, respirations are 18, pulse rate  71, temperature is 98.2.  Capillary blood glucoses 239 mg percent.  Inspection of the left foot shows that the wound at that the fifth  metatarsal phalangeal joint is grossly better perfused.  There is  healthy-appearing granulation tissue.  The cancellus bone from the  previously debrided articular surface of the metatarsal phalangeal joint  is well perfused with no evidence of necrosis.  There are areas of  nonviable tissue in the periphery which were debrided with a short  technique utilizing the rongeur and a 10 blade.  Hemorrhage was  controlled with direct pressure.  A copious irrigation was performed  with saline.  The wound #2 on the heel shows continued contraction with  100% granulation.  There is no evidence of a ascending infection.  The  pedal pulses remain indeterminate, but there is no evidence of  progressive ischemia.   ASSESSMENT:  Clinical improvement with debridement, continued hyperbaric  oxygen therapy and offloading.   PLAN:  We will continue the  moist-moist and dressing with a silver gel  and a bulky dressing with continued offloading.  We will repeat his TCOM  at depth in anticipation that we would see significant improvement.      Harold A. Tanda Rockers, M.D.  Electronically Signed     HAN/MEDQ  D:  09/16/2006  T:  09/16/2006  Job:  [id number]

## 2010-08-06 NOTE — Assessment & Plan Note (Signed)
[facility]   NAME:  AARUSH, STUKEY NO.:  [id number]   MEDICAL RECORD NO.:  [id number]      DATE OF BIRTH:  1946-03-17   PHYSICIAN:  Maxwell Caul, M.D.      VISIT DATE:                                   OFFICE VISIT   [name] is a gentleman we know here from a complicated stay with [place]  with a Wegener's grade 3 diabetic foot ulcer involving his left fifth  metatarsal head and left heel.  Ultimately, he underwent a protracted  series of HBO treatment, and miraculously, he did quite well and these  areas healed up.  He was last seen here on December 14, 2006, at which  point in time, he was still in a Cam walker and a white diabetic sock.  He was discharged at that point.  He tells me that he did well up until  roughly 3 months ago when he noticed what looked to him like a  cigarette burn on the left heel where the previous heel ulcer was.  He  was seen by his doctor.  They prescribed topical treatments including  normal saline and Neosporin.  The open area gradually improved.  The  drainage improved.  However, it did not heal over and he was referred  back here.   PAST MEDICAL HISTORY:  Right above-knee amputation 2 years ago, coronary  stents for angina, type 2 diabetes, hyperlipidemia, hypertension, and  BPH.   Medication list is reviewed, notable for NovoLog 70/30 and FlexPen  70/30.  He is also taking ACE inhibitors and calcium blockers.  He is  still on Plavix.  He takes Pravachol for hyperlipidemia and he is on  Coreg and Hytrin.   PHYSICAL EXAMINATION:  GENERAL:  He does not look to be unwell.  He is  afebrile.  RESPIRATORY:  Clear air entry bilaterally.  CARDIAC:  Heart sounds were normal.  No S4 was appreciated.  No S3.  EXTREMITIES:  Miraculously, his left foot looks really quite good.  There is slight edema; however, no evidence of infection.  Over the  plantar aspect of his left heel, right where the previous wound  was, was  a small callus area with a central area of what looked to be subdermal  hemosiderin.  I removed all the callus, and actually pared down this  area painstakingly.  Fortunately, there was really no open wound here.  I think the wound is largely healed over.   IMPRESSION:  Diabetic foot ulcer, probably was a Wegener's grade 1,  which is almost completely healed with therapy at home.   I gave him counseling about pressure relief here, and I think most of  this was a pressure-induced wound.  He was advised to keep off this at  night, to float his heel when he is sitting up in a chair for instance.  Other than that, I think he can continue in his own diabetic footwear.  I have given him one more appointment in 2 weeks to simply review this  area for completeness of resolution, although I have no doubt that this  is resolved.           ______________________________  Maxwell Caul, M.D.  MGR/MEDQ  D:  11/25/2007  T:  11/26/2007  Job:  [id number]   cc:   [facility]

## 2010-08-06 NOTE — Assessment & Plan Note (Signed)
[facility]   NAME:  [name], [name] NO.:  [id number]   MEDICAL RECORD NO.:  [id number]      DATE OF BIRTH:  05-Jun-1945   PHYSICIAN:  Maxwell Caul, M.D. VISIT DATE:  12/09/2007                                   OFFICE VISIT   LOCATION:  [facility].   [name] is a gentleman I saw 2 weeks ago with a new wound on his  left heel where he was previously treated in this clinic for an  extensive left heel ischemic ulcer.  When he was here last time, I  meticulously removed a large amount of callus from this area and found a  small subdermal hemosiderin deposit without any open wound.  I think the  hard work of healing had already been done before he came here.  Nevertheless, I have discussed this in detail with his wife and Mr.  Barrette himself.  Both of them are well versed in this talk; however, I  emphasized that there was callus, there was pressure, and he returns  today in followup.   IMPRESSION:  On examination, temperature is 97.7.  The area on the left  posterior heel has largely close that is fully epithelialized.  There is  no wound here and no surrounding callus.   Once again, we talked about pressure relief.  I provided him with an  Allevyn heel; however, these are expensive and there are other  substitutes.  Especially in bed at night, the pressure needs to come off  the heel and he can continue in his own diabetic foot wear, but he needs  to be aware that even these can sometimes cause pressure.   I have discharged him at this point.  His wounds have resolved.  He  certainly knows how to contact us should there be additional problems.           ______________________________  Maxwell Caul, M.D.     MGR/MEDQ  D:  12/09/2007  T:  12/10/2007  Job:  [id number]

## 2010-08-06 NOTE — Assessment & Plan Note (Signed)
[facility]   NAME:  [name] [name], [name] [name] NO.:  [id number]   MEDICAL RECORD NO.:  [id number]      DATE OF BIRTH:  [birth year]   PHYSICIAN:  Theresia Majors. Tanda Rockers, M.D. VISIT DATE:  10/21/2006                                   OFFICE VISIT   SUBJECTIVE:  [name] [name] is a 65 year old man with a Wegner grade 3  diabetic foot ulcer involving his right lateral foot.  He is  concurrently undergoing hyperbaric oxygen treatment.  He has had a total  of 60 dives.  He returns for follow-up.  He denies excessive drainage,  malodor, pain or fever.   OBJECTIVE:  Blood pressure is 110/68, respirations 18, pulse rate 66,  temperature 98.  Capillary blood glucoses 213 mg percent.  Inspection of  the left lateral foot shows that the ulcer has dramatically decreased in  area and volume.  There is 100% granulation.  There is a deeper area  measuring approximately 1.2 cm.  This tract is clean.  There is no  evidence of necrosis and debridement is not needed.  The pedal pulse  remains indeterminate and consistent with his known state of vascular  disease.   ASSESSMENT:  Clinical improvement of wound with hyperbaric oxygen and  offload.   PLAN:  We should continue HBO for an additional 10 dives.  We will  reevaluate the patient in 5 days.      Harold A. Tanda Rockers, M.D.  Electronically Signed     HAN/MEDQ  D:  10/21/2006  T:  10/22/2006  Job:  [id number]

## 2010-08-06 NOTE — Assessment & Plan Note (Signed)
[facility]   NAME:  KEAUN, SCHNABEL NO.:  [id number]   MEDICAL RECORD NO.:  [id number]      DATE OF BIRTH:  09-28-1945   PHYSICIAN:  Maxwell Caul, M.D. VISIT DATE:  09/30/2006                                   OFFICE VISIT   PURPOSE OF TODAY'S VISIT:  Placement of an Apligraf in a Wegener's grade  4 diabetic ulcer.   The patient actually has 2 ulcers we have been following.  One is on the  fifth metatarsal head, the other on the lateral aspect of his heel.  The  area on his heel is closing over nicely and has not really been a  particular difficulty.  The left lateral fifth metatarsal head is the  Wegener's grade 4 wound.  He has been receiving hyperbaric oxygen for  this.   WOUND EXAM:  The wound tunnels in 2 cm.  The tissue appears to be well  epithelialized.  There was no evidence of surrounding infection today.  We applied an Apligraf into the depth of the wound with Adaptic.  We  then placed some moistened 2 x 2 to keep the dressings opposed to the  wound bed as much as we can.           ______________________________  Maxwell Caul, M.D.     MGR/MEDQ  D:  09/30/2006  T:  09/30/2006  Job:  [id number]

## 2010-08-06 NOTE — Assessment & Plan Note (Signed)
[facility]   NAME:  [name] [name], [name] [name] NO.:  [id number]   MEDICAL RECORD NO.:  [id number]      DATE OF BIRTH:  20-Nov-1945   PHYSICIAN:  Theresia Majors. Tanda Rockers, M.D. VISIT DATE:  11/30/2006                                   OFFICE VISIT   SUBJECTIVE:  [name] [name] is a 65 year old man whom we have managed for a  Wagner grade 3 diabetic foot ulcer involving the left foot.  In the  interim, he has been treated with complete offloading using a Cam walker  and daily antiseptic soap washes, and irrigations with saline.  There  has been no excessive drainage malodor pain or fever.  He returns for  followup.  He is accompanied by his wife.   OBJECTIVE:  Blood pressure is 122/76, respirations 18, pulse rate 70,  temperature 97.1.  Capillary blood glucose is 120 mg percent.  Inspection of the left foot shows that the wound continues to contract.  It is clean.  There is a halo of callus which was pared using a 15  blade.  There was no resulting hemorrhage.  The granulating base appears  to be healthy.  There is no extension into the joint.  There is no  exposed tendon.   ASSESSMENT:  Satisfactory clinical response, continued healing with  offloading and local care.   PLAN:  We will continue daily saline irrigations, antiseptic soap  washing, and complete offloading utilizing a Cam Walker.  We will  reevaluate the patient in 2 weeks p.r.n.      Harold A. Tanda Rockers, M.D.  Electronically Signed     HAN/MEDQ  D:  11/30/2006  T:  12/01/2006  Job:  [id number]

## 2010-08-06 NOTE — Assessment & Plan Note (Signed)
[facility]   NAME:  [name], [name] NO.:  [id number]   MEDICAL RECORD NO.:  [id number]            DATE OF BIRTH:   PHYSICIAN:  Theresia Majors. Tanda Rockers, M.D. VISIT DATE:  08/19/2006                                   OFFICE VISIT   SUBJECTIVE:  [name] [name] is a 65 year old man who is undergoing  hyperbaric oxygen treatment for a Wagner grade 3 diabetic foot ulcer  involving his left foot.  In the interim, he has had no significant  fever, malodorous drainage or pain.  He is accompanied by his wife.   OBJECTIVE:  Blood pressure is 132/76, respirations are 18, pulse rate is  68, temperature 97.1.  Capillary blood glucose is 340 mg%.  Inspection  of the foot shows that wound number 1, at the first met, has an area of  nonviable tissue, which underwent a full thickness debridement with  hemorrhage control with direct pressure.  There is no evidence of  abscess formation.  Wound number 2, on the heel, underwent a full  thickness debridement with removal of a thick collagenous exudate.  Underneath, however, is a healthy-appearing granulating bed.   ASSESSMENT:  Clinical improvement with hyperbaric oxygen treatment.   PLAN:  We will schedule the patient for an Apligraf during this next  wound assessment.  In the interim, we will place him in an Aquacel  dressing with Silver, a 4 x 4 Kerlix and continue the Profore boot for  offloading.   We have discussed this plan with the patient and his wife in terms that  they both seem to understand.  They indicate that they wish to proceed  as outlined.      Harold A. Tanda Rockers, M.D.  Electronically Signed     HAN/MEDQ  D:  08/19/2006  T:  08/19/2006  Job:  [id number]

## 2010-08-06 NOTE — Assessment & Plan Note (Signed)
[facility]                            CARDIOLOGY OFFICE NOTE   JERON, GRAHN                      MRN:          [id number]  DATE:05/05/2007                            DOB:          06/18/1945    Mr. Lowrey returns for follow-up today.  He is a gentleman who has a  history of coronary disease.  In February 2006, the patient had PCI of  his right coronary artery with a drug-eluting stent. When I last saw  him, we schedule him to have a Myoview which was performed on December 16, 2006.  His ejection fraction was 59% and there is no ischemia or  infarction.  Note, he had an echocardiogram in December 2007 that showed  normal LV function.  He also had carotid Dopplers in December 2007 that  showed no significant stenosis.  Since I last saw him, there is no  dyspnea, chest pain, palpitations or syncope.  He has had a foot ulcer  that is presently being treated.   MEDICATIONS:  1. Plavix 75 mg p.o. daily.  2. Pravachol 40 mg p.o. daily.  3. Carvedilol 12.5 mg p.o. b.i.d.  4. Prevacid.  5. Nasonex.  6. Insulin.  7. Amlodipine 5 mg p.o. daily.  8. Terazosin 10 mg p.o. daily.  9. Lisinopril HCT 20/25 mg p.o. daily.   PHYSICAL EXAMINATION:  Blood pressure 138/79, his pulse is 73. He weighs  205 pounds.  HEENT:  Normal.  NECK:  Supple.  CHEST:  Clear.  CARDIOVASCULAR:  Regular rate and rhythm.  ABDOMEN:  Shows no tenderness.  EXTREMITIES:  Show no edema. He is status post amputation on the right.   Electrocardiogram shows a sinus rhythm at a rate of 73.  There are  occasional PACs.  There is a right bundle branch block.   DIAGNOSES:  1. Coronary artery disease - Mr. Cruickshank has had no chest pain or      shortness of breath and his recent Myoview showed no ischemia or      infarction.  We will continue with medical therapy including his      Plavix, statin, beta blocker and ACE inhibitor.  2. Hyperlipidemia - he will continue on his  statin and Dr. Harvie Junior is      following his lipids and liver.  Our goal LDL would be less than 70      given his history of coronary disease.  3. Diabetes mellitus - per Dr. Harvie Junior. [name]   HISTORY:  1. History of peripheral vascular disease.  2. Hypertension - his blood pressure is adequately controlled on his      present medications.  Dr. Harvie Junior is following his renal function      (the patient states that she drew blood yesterday).   He will continue the risk factor modifications and we will see him back  in 9 months.     Madolyn Frieze Jens Som, MD, [facility]  Electronically Signed    BSC/MedQ  DD: 05/05/2007  DT: 05/06/2007  Job #: [id number]   cc:   Alvester Morin, M.D.

## 2010-08-06 NOTE — Assessment & Plan Note (Signed)
[facility]   NAME:  [name] [name], [name] [name] NO.:  [id number]   MEDICAL RECORD NO.:  [id number]      DATE OF BIRTH:  10-19-1945   PHYSICIAN:  Maxwell Caul, M.D. VISIT DATE:  11/23/2008                                   OFFICE VISIT   [name] is a gentleman who returned to see [place] last week.  He had  previously been seen in 2009 at which time, he had a deep wound of his  left heel.  This healed and he was discharged in September 2009.  He is  a type 2 diabetic on insulin.  He has a previous right above-knee  amputation.   Last week, when he was here, he had developed a Wegener grade 2 wound on  the anterior foot, probably from friction of foot wear.  We did an ABI  last week, measured 0.96.  The wound required nonexcisional debridement  last week.  We applied Puracol, hydrogel, a dry dressing, and put him in  a healing sandal.  His wife has been changing the dressing.   On examination, the wound looks considerably better.  There is advancing  epithelialization.  No evidence of infection.  I removed some light  callus from around the circumference of the wound.  Nothing needed  culturing.  The wound itself was not actually debrided.   IMPRESSION:  Wegener grade 2 diabetic foot wound on the left.  We  applied Puracol, Mepitel dry dressing keeping him in the healing sandal.  I am confident this will close over in the next week or two.           ______________________________  Maxwell Caul, M.D.     MGR/MEDQ  D:  11/23/2008  T:  11/24/2008  Job:  [id number]

## 2010-08-06 NOTE — Assessment & Plan Note (Signed)
[facility]                            CARDIOLOGY OFFICE NOTE   [name] [name], [name] [name]                      MRN:          [id number]  DATE:01/19/2008                            DOB:          1945/08/21    [name] [name] is a very pleasant 65 year old gentleman, has a history of  coronary disease status post PCI of his right coronary artery with drug-  eluting stent in February 2006.  His most recent Myoview in September  2008, showed no ischemia or infarction.  His ejection fraction was 59%.  An echocardiogram was also performed in December 2007, that showed  normal LV function.  Since he was last seen he is doing reasonably well.  He does occasionally had chest pain.  The pain is in the substernal  area.  He thinks it may be indigestion.  It can last for an hour at a  time.  It is not exertional.  It does not radiate.  It is not pleuritic  in position.  There is no associated nausea, vomiting, shortness of  breath, or diaphoresis.  It resolves spontaneously.  He otherwise is not  having dyspnea or increased pedal edema.  He is going to use his new  right lower extremity prosthesis.   MEDICATIONS:  1. Plavix 75 mg p.o. daily.  2. Pravachol 40 mg p.o. daily.  3. Insulin.  4. Amlodipine 5 mg p.o. daily.  5. Terazosin 10 mg p.o. nightly.  6. Lisinopril/HCT 20/25 mg p.o. daily.  7. Omeprazole 20 mg p.o. daily.  8. Carvedilol 12.5 mg p.o. b.i.d.  9. Multivitamin.   PHYSICAL EXAMINATION:  VITAL SIGNS:  Today shows a blood pressure of  142/74 and his pulse is 67.  Weight is 206 pounds.  HEENT:  Normal.  NECK:  Supple.  There are no bruits noted.  CHEST:  Clear.  CARDIOVASCULAR:  Regular rate and rhythm.  ABDOMINAL:  No tenderness.  EXTREMITIES:  No edema on the left and he has a prosthesis on the right.   His electrocardiogram shows a sinus rhythm at a rate of 68.  There is  left axis deviation and a right bundle branch block is noted.   DIAGNOSES:  1. Chest pain - [name] [name]'s symptoms are somewhat atypical.  We will      plan to repeat his adenosine Myoview.  If it shows no ischemia,      then we will continue with medical therapy.  2. Coronary artery disease - he will continue with his Plavix, statin,      beta-blocker, and ACE inhibitor.  3. Hypertension - his blood pressure is reasonably well controlled.      We will check a BMET following his potassium and renal function.  4. Hyperlipidemia - he will continue on his statin.  We will check      lipids and liver to adjust as indicated.  5. Diabetes mellitus.  6. History of peripheral vascular disease.   We will see him back in 9 months.     Madolyn Frieze Jens Som, MD, [facility]  Electronically Signed    BSC/MedQ  DD:  01/19/2008  DT: 01/20/2008  Job #: [id number]

## 2010-08-06 NOTE — Assessment & Plan Note (Signed)
[facility]   NAME:  [name] [name], [name] [name] NO.:  [id number]   MEDICAL RECORD NO.:  [id number]      DATE OF BIRTH:  1945/06/05   PHYSICIAN:  Maxwell Caul, M.D. VISIT DATE:  11/17/2008                                   OFFICE VISIT   [name] is a gentleman we know from treatment in this clinic last  year.  He had at that time a deep wound of his left heel.  This  eventually healed and he was discharged in September 2009.  He is a type  2 diabetic on insulin.  He has previous right above-knee amputation with  known peripheral vascular disease.   He tells me that in the last 2 weeks he developed trauma from friction  to his dorsal surface of his left anterior foot.  He was seen in his  primary physician's office and referred here for review of this.  He has  not had any pain drainage.  He has not been systemically unwell.  His  diabetes is under reasonably good control.   On examination of the left foot, his peripheral pulses at both dorsalis  pedis and posterior tibial were difficult to feel; however, I was able  to do an ABI on the left leg measuring 0.96.   The wound in question was on the dorsal surface of the left anterior  foot.  It was covered with a black eschar.  This underwent a light  nonexcisional debridement.  Underneath, he had a wound, which measured  0.5 x 0.6 x 0.1 and had a nice red granulated base.   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  This does not appear to be infected, although there is a darkened area  around it.  There was no pain or no warmth.  I did not do a culture.  We  applied Puracol, hydrogel, dry dressing, and an Ace wrap to this wound.  We put him in a healing sandal and he will change the dressing every 2  days and be seen in this clinic next week.           ______________________________  Maxwell Caul, M.D.     MGR/MEDQ  D:  11/17/2008  T:  11/17/2008  Job:  [id number]

## 2010-08-06 NOTE — Assessment & Plan Note (Signed)
[facility]   NAME:  [name] [name], [name] [name] NO.:  [id number]   MEDICAL RECORD NO.:  [id number]            DATE OF BIRTH:   PHYSICIAN:  Theresia Majors. Tanda Rockers, M.D. VISIT DATE:  08/27/2006                                   OFFICE VISIT   SUBJECTIVE:  [name] [name] is a 65 year old man who is currently  undergoing hyperbaric oxygen treatment for a Wagner grade 3 diabetic  foot ulcer.  Yesterday he underwent an extensive debridement of the  right fifth metatarsophalangeal joint.  Initially, the patient had no  pain with this debridement as a result of his anesthetic and insensate  status of the foot.  He returns for HBO and has complained of pain  starting last p.m.  He denies interim trauma, he has not been walking on  the foot.  In spite of 100% oxygen at 2 atmospheres, the patient  continued to have pain.  Upon his ascent, he is evaluated to ascertain  the etiology of his pain.   OBJECTIVE:  His blood pressure is 131/64, respirations are 18, pulse  rate 78, temperature 98.9.  Capillary blood glucose is 169 mg% post  dive.  Inspection of the wound shows that there has been retention of  the Prisma.  The halo of callus as well as discoloration is essentially  unchanged.  The Prisma was manually removed with forceps and thereafter  copiously irrigated with saline.  The saline irrigation did reproduce  some of his pain.  There is no edema of the foot, and there is no  malodor.  There is moderate tenderness  and grossly hypoperfused tissue  in the periwound area.  Frank necrosis is not demonstrated.  The wound  dressing was replaced with a moist moist saline gauze and Kerlix.  The  heel wound was essentially unchanged with intact Prisma.  The heel wound  was nontender.  There is no evidence of progressing ischemia.  The foot  is warm.  The vascular exam is unchanged, i.e. there are no palpable  pulses.   ASSESSMENT:  The patient most likely is experiencing  rest pain.   PLAN:  We have given the patient a prescription for Demerol tablets 50  mg.  We have instructed him that he may take 2 of the tablets every 3 to  4 hours p.r.n. for pain.  If he still does not get relief, he can  increase to 3 tablets every 3 to 4 hours.  We have ordered a TCOM at  depth during his HBO on tomorrow.  If there is no significant  improvement of the cutaneous oxygen at depth and the pain is not  relieved, this would be consistent with ischemic rest pain.   We have explained this assessment to the patient and his wife in terms  that they seem to understand.  We have indicated further that in the  face of ischemic pain that is unrelieved with hyperbaric oxygen  treatment, it would be unlikely that healing would be achieved.  I have  recommended that we continue HBO and antibiotics.  The patient and the  wife seem to understand, express gratitude for having been seen in the  clinic, and indicate that they will be  compliant as per above.      Harold A. Tanda Rockers, M.D.  Electronically Signed     HAN/MEDQ  D:  08/27/2006  T:  08/27/2006  Job:  [id number]

## 2010-08-06 NOTE — Assessment & Plan Note (Signed)
[facility]   NAME:  [name] [name], [name] [name] NO.:  [id number]   MEDICAL RECORD NO.:  [id number]      DATE OF BIRTH:  1946/01/28   PHYSICIAN:  Theresia Majors. Tanda Rockers, M.D. VISIT DATE:  12/13/2006                                   OFFICE VISIT   SUBJECTIVE:  [name] is a 65 year old man who returns for followup  of a Wagner 4 diabetic foot ulcer.  The patient has completed a course  of hyperbaric oxygen treatment and has been placed in a CAM walker for  foot protection.  He denies interim drainage, pain, or fever.  He has  continued to use antiseptic soap washing and completely offloading of  the foot using the CAM walker.  He was last seen 2 weeks ago.   OBJECTIVE:  Blood pressure is 130/74, respirations 18, pulse rate 70,  temperature is 97.9.  He is accompanied by his wife.  Inspection of the  left lateral foot shows that the wound is 100% resolved.  There is no  evidence of abscess, fluctuance, or concurrent infection.  The skin is  normal and supple.   IMPRESSION:  Resolved wound.   PLAN:  We will continue the patient in the CAM walker with thick cotton  sock.  We have given a prescription for custom orthotics to include a  custom shoe and insert.  He is to continue to observe his foot, every  day, and after each excursion, no matter how short, even 4 or 5 steps,  to make sure that there is no concurrent injury to the foot.  We have  explained this approach to maximize the protection to both the patient  and his wife in terms that they seem to understand.  We have given him  an opportunity to ask questions.  They express gratitude for having been  seen in the clinic, and indicate that they will be compliant.   With regard to rehabilitation, we have referred the patient to his  original surgeon and primary care  physician for instructions as to how  to proceed with his rehab.  We will be willing to reevaluate him in the  event that his wound  breaks down or he has any difficulty in the future.  At this point, we are discharging him with the above instructions.      Harold A. Tanda Rockers, M.D.  Electronically Signed     HAN/MEDQ  D:  12/14/2006  T:  12/14/2006  Job:  [id number]   cc:   Coralie Carpen, M.D.

## 2010-08-06 NOTE — Assessment & Plan Note (Signed)
[facility]   NAME:  [name] [name], [name] [name] NO.:  [id number]   MEDICAL RECORD NO.:  [id number]      DATE OF BIRTH:  21-Apr-1945   PHYSICIAN:  Maxwell Caul, M.D. VISIT DATE:  11/04/2006                                   OFFICE VISIT   [name] is a gentleman with a difficult Wagner's grade 3 diabetic  foot ulcer involving his right fifth metatarsal head.  He is continuing  with hyperbaric oxygen which will end on Friday in this certification  round.  He has had 70 dives.  His wife is using antiseptic soap washes  and a moist packing, changing this daily.  He has had no excessive  drainage, malodor or pain.   EXAMINATION:  His temperature is 97.9, pulse 66, respirations 16, blood  pressure 125/67.  His blood glucose is 116.  The wound measures 1.5 x  1.1 and has a sinus depth of probably 1.5 cm to 2 cm.  The depth of the  wound otherwise is 1 cm.  There is good healthy granulation here and an  attempt at epithelialization.  However, I am not certain that the  granulation is filling the sinus tract.  Nevertheless, the entire wound  looks very healthy.  There is no evidence of infection, drainage,  nothing needed, debridement or culturing today.   IMPRESSION:  Wagner's grade 3 diabetic foot wound.  My general  impression of this is we probably should continue him for another 30  dives.  I will ask for recertification but leave the final decision on  whether this is used to Dr. Tanda Rockers, who is more familiar with the  progression of this wound.  The patient expressed a wish end his daily  trips here.  However, I have cautioned that this is the type of wound  that hyperbaric oxygen as most effective for.           ______________________________  Maxwell Caul, M.D.     MGR/MEDQ  D:  11/04/2006  T:  11/05/2006  Job:  [id number]

## 2010-08-06 NOTE — Assessment & Plan Note (Signed)
[facility]   NAME:  [name] [name], [name] [name] NO.:  [id number]   MEDICAL RECORD NO.:  [id number]            DATE OF BIRTH:   PHYSICIAN:  Theresia Majors. Tanda [name], M.D. VISIT DATE:  09/02/2006                                   OFFICE VISIT   SUBJECTIVE:  [name] [name] is a 65 year old man who we are treating with  hyperbaric oxygen for a Wagner grade 3 diabetic foot ulcer involving the  fifth met head of the left foot.  He also has a Wagner grade 2 diabetic  foot ulcer on the left heel.  Since his last wound exam, he had 2 T-Com.  The initial one was extremely poor, showing no improvement at depth and  less than 30 torque.  Today's repeat T-Com has increased at depth to  160.  The patient gives a history of having been walking on the  unprotected foot during the night to go the bathroom.  He has nocturia 2-  3 times a night.  We have also performed interim debridement of  extensive necrosis in the periwound area.  The repeat T-Com today was  performed prior to the wound evaluation and at depth.  In the interim,  there has been no excessive pain or fever.   OBJECTIVE:  Post HBO.  The blood pressure is 140/64, pulse rate 71,  respirations are 18, temperature 98.3.  Capillary blood glucose is 183  mg%.  Examination of the left foot shows that the wound, itself, has  less necrosis.  There is a scant amount of pressure necrosis at the  periphery of the wound.  The internal wound, however, is bright red in  part due to the recent HBO treatment, but there is absolutely no  progression of necrosis.  There is no seperative changes.  The heel  wound shows contraction with advancing epithelium.  Both wounds were  photographed, measured and cataloged in the wound expert.  The pedal  pulse remains indeterminate and consistent with his previous vascular  workup.   ASSESSMENT:  Improvement.  The interim deterioration of the wound is  most likely attributed to pressure  necrosis attendant with inadequate  off loading i.e. walking without protective foot wear.  The TCOM values  are consistent with ischemia in the injured tissue and demonstrate  recovery.   PLAN:  We will continue his HBO treatment.  We will continue him on the  Septra and doxycycline.  We will insist that the patient wear the Darco  offloading sandal at all times, even during the night for brief trips to  the lavatory.  We have explained the necessity of continued offloading  to the patient and his wife in terms that they seem to understand.  They  both appreciate the critical and tenuous nature of the improvement that  we have noted to date.  He will be reevaluated in 5 days and if there is  no evidence of continued improvement, his hyperbaric oxygen will be  discontinued.  We have given the patient and his wife an opportunity to  ask questions.  They seem to understand the foregoing and indicate that  they will be compliant as per above.      [name] [name],  M.D.  Electronically Signed     HAN/MEDQ  D:  09/02/2006  T:  09/02/2006  Job:  [id number]

## 2010-08-06 NOTE — Assessment & Plan Note (Signed)
[facility]   NAME:  [name] [name], [name] [name] NO.:  [id number]   MEDICAL RECORD NO.:  [id number]      DATE OF BIRTH:  12-12-1945   PHYSICIAN:  Theresia Majors. Tanda Rockers, M.D. VISIT DATE:  08/12/2006                                   OFFICE VISIT   SUBJECTIVE:  [name] is a 65 year old man who is concurrently  undergoing hyperbaric oxygen treatment for Wagner grade 3 diabetic foot  ulcers involving the left lateral fifth met head and the left heel.  In  the interim we have continued offloading him with a modified healing  sandal.  In the interim he denies excessive drainage or pain.  He has  had some mild malodor.  He denies fever.  He is accompanied by his wife.   OBJECTIVE:  Blood pressure is 131/71, respirations 18, pulse rate 71,  temperature 98.2, capillary blood glucose is 206 mg%.   EXAMINATION:  Inspection of the foot shows that the heel wound has some  scant necrosis, which was removed with a sharp debridement, a 10 blade  with hemorrhage control with direct pressure. The wound on the fifth  lateral met head has a liquefying eschar with moderate malodor.  This  wound was sharply debrided with a 10 blade.  There was healthy appearing  tissue beneath the liquefying eschar.  Areas of hemorrhage were  controlled with silver nitrate.  The wound extended down to the joint  capsule, but there was no evidence of tunneling or abscess into the  tendon sheath.  There is advancing granulation from the periphery.   IMPRESSION:  Clinical improvement with hyperbaric oxygen treatment.   PLAN:  We will dress the patient with Promogran hydrogel and a bulky  dressing and return him to the offloading sandal.  We will reevaluate  him in one week.  We anticipate that a healthy granulating bed will be  present in both wounds and the patient may undergo Apligraf's to these  wounds.  We have explained this approach to the patient and his wife in  terms that they seem  to understand.  They have been given an opportunity  to ask questions.  They express gratitude for having been seen in the  clinic.  The patient's pedal pulses remain indeterminate and there is  trace edema, but no evidence of ascending infection.      Harold A. Tanda Rockers, M.D.  Electronically Signed     HAN/MEDQ  D:  08/12/2006  T:  08/12/2006  Job:  [id number]

## 2010-08-06 NOTE — Assessment & Plan Note (Signed)
[facility]   NAME:  AUTUMN, GUNN NO.:  [id number]   MEDICAL RECORD NO.:  [id number]      DATE OF BIRTH:  01-Nov-1945   PHYSICIAN:  Theresia Majors. Tanda Rockers, M.D. VISIT DATE:  09/23/2006                                   OFFICE VISIT   SUBJECTIVE:  Mr. Zeitlin is a 65 year old man with a Wagner grade 4  diabetic foot ulcer.  He has undergone serial debridements and  hyperbaric oxygen treatment with offloading utilizing Cam walker.  He  has had a period of retrogression associated with inadequate offloading,  maceration, and necrosis in the area of the wound.  He returns for  follow-up.  He continues his HBO treatment.  In the interim.  There is  been no excessive pain, malodor, or drainage.   OBJECTIVE:  Blood pressure is 110/69, respirations 16, pulse rate 67,  temperature 97.7.  Capillary blood glucose was 227 mg percent this  morning.  Inspection of the left lateral foot shows that wound #1 is  100% granulating with very scant areas of necrosis that was sharply  excised with Rongeur's.  Bleeding was controlled with direct pressure.  The wound #2 on the heel continues to contract and is 100% granulated,  there is evidence of advancing epithelium.   ASSESSMENT:  Clinical improvement with continued hyperbaric oxygen  therapy.  No evidence of active infection, no evidence of critical  ischemia.   PLAN:  We will continue the hyperbaric oxygen treatment and will proceed  with Apligraf placement as permitted by the insurance carrier.   We have explained this approach to the patient and his wife in terms  that they seem to understand, they express gratitude for having been  seen in the clinic and indicate their desire to proceed as outlined.  We  will continue his hyperbaric oxygen treatment.  With regard to his wound  we will continue moist-moist daily dressings with a silver gel.      Harold A. Tanda Rockers, M.D.  Electronically Signed     HAN/MEDQ  D:  09/23/2006  T:  09/23/2006  Job:  [id number]

## 2010-08-06 NOTE — Assessment & Plan Note (Signed)
[facility]   NAME:  [name] [name], [name] [name] NO.:  [id number]   MEDICAL RECORD NO.:  [id number]            DATE OF BIRTH:   PHYSICIAN:  Theresia Majors. Tanda Rockers, M.D. VISIT DATE:  09/09/2006                                   OFFICE VISIT   SUBJECTIVE:  [name] [name] is a 65 year old man who is currently  undergoing hyperbaric oxygen treatment for a Wagner grade 3 diabetic  foot ulcer of his left foot.  In the interim, he had an episode of  deterioration of his wound, which the patient admits today was related  to his mowing the grass.  The wound essentially broke down, was  associated with nonviable contused tissue, with T-COMS, which were  commiserated with severe injury and ischemia.  He has since been  offloaded completely by staying in the wheelchair.  He returns for an  interim wound evaluation.  There has been no excessive pain, malodor or  drainage.  He is accompanied by his wife.   OBJECTIVE:  Blood pressure is 117/69, respirations are 20, pulse rate  70, temperature is 98.4.  Capillary blood glucose is 162 mg%.  Inspection of the left foot shows that there is mild edema.  The wound  has evidence of healthy granulation associated with an area of central  necrosis and now a nonviable tissue.  This area underwent a full  thickness debridement using a combination of rongeur and a 10 blade.  There was active bleeding, which was controlled with pressure.  The  wound was copiously irrigated with saline and we placed a Silver Gel  dressing and gauze.  Wound number 2 shows evidence of granulation with  advance in epithelium.   ASSESSMENT:  Clinical improvement.   PLAN:  We have reemphasized that the patient should not mow his grass,  should complete offloading.  We will continue his hyperbaric oxygen  treatment and we will continue him on the doxycycline and Septra.  We  will reevaluate him in 5 days.  We have given the patient and his wife  and  opportunity to asks questions.  They seem to understand the  foregoing discussion and indicate that they will be compliant.      Harold A. Tanda Rockers, M.D.  Electronically Signed     HAN/MEDQ  D:  09/09/2006  T:  09/09/2006  Job:  [id number]

## 2010-08-06 NOTE — Assessment & Plan Note (Signed)
[facility]   NAME:  [name] [name], [name] [name] NO.:  [id number]   MEDICAL RECORD NO.:  [id number]      DATE OF BIRTH:  06-03-1945   PHYSICIAN:  Theresia Majors. Tanda Rockers, M.D. VISIT DATE:  08/05/2006                                   OFFICE VISIT   SUBJECTIVE:  [name] [name] is a 65 year old man who has undergone  hyperbaric oxygen treatment for a Wagner grade 3 diabetic foot ulcer.  During his last wound exam, we have placed him on Promogran with  hydrogel to both wounds #1 and #2.  He continued to wear a Profore boot  and wheelchair for complete offloading.  He returns for evaluation.  There has been no excessive drainage or malodor and no fever.  In the  interim, he has been seen by otolaryngology for removal of his earwax.   OBJECTIVE:  Blood pressure is 112/67, respirations 18, pulse rate 66,  temperature is 98.3.  Capillary blood glucose is 137 mg%.   Inspection of the wound shows that there has been clinical improvement  and contraction of both wounds #1 and #2.  The lateral fifth metatarsal  head wound has a healthily scab with no exudate, no malodor, no  drainage, no hyperemia.  The left heel wound has a central area of full-  thickness necrosis, which we debrided with a rongeur and a 10 blade.  The patient tolerated this very well without excessive pain.  The  bleeding that resulted was controlled with direct pressure.   ASSESSMENT:  Clinical improvement attendant with hyperbaric oxygen  treatment.   PLAN:  We will continue his hyperbaric oxygen treatment and reevaluate  the patient in 1 week.      Harold A. Tanda Rockers, M.D.  Electronically Signed     HAN/MEDQ  D:  08/05/2006  T:  08/05/2006  Job:  [id number]

## 2010-08-06 NOTE — Assessment & Plan Note (Signed)
[facility]   NAME:  [name] [name] NO.:  [id number]   MEDICAL RECORD NO.:  [id number]      DATE OF BIRTH:  Aug 24, 1945   PHYSICIAN:  Theresia Majors. Tanda [name], M.D. VISIT DATE:  10/14/2006                                   OFFICE VISIT   SUBJECTIVE:  [name] [name] is a 65 year old man who we are treating for a  Wagner grade 3 diabetic foot ulcer involving the left lateral fifth met  head.  We have treated him with combination therapies including  hyperbaric oxygen, serial debridements and most recently an Apligraf was  placed 2 weeks ago.  The patient returns for follow-up.  He also had  wound #2 on the left heel.  In the interim, he has complained of some  drainage which was bloody but there has been no pain and there has been  no fever.   OBJECTIVE:  Blood pressure is 115/70, respirations 16, pulse rate 68,  temperature is 98.  Capillary blood glucose is 132 mg percent.  Inspection of the left foot wound #1 shows that the Apligraf has greatly  improved the wound.  There is now a contracting wound with come 100%  granulation, no exposed bone whatsoever and advancing epithelium.  The  wound was measured and photographed and cataloged in the wound expert,  please refer to the photo.  Wound #2 on the left heel has completely re-  epithelialization and has resolved.   ASSESSMENT:  Clinical improvement of Wagner grade 2 wound. Clinical  improvement of the Wagner grade 3 diabetic foot ulcer.   PLAN:  We will continue hyperbaric oxygen therapy.  We will continue his  dressing to wound #1 to include moist moist dressing, 4x4 Kerlix and an  ACE. We will continue him in a Cam walker.  We will allow him to bathe  the foot daily with antiseptic soap and thoroughly drying and returning  to the Cam walker for absolute offloading.   We have explained this change in wound care to the patient and his wife  in terms that they seem to understand.  We have  encouraged them to take  extreme cautioned to avoid undue pressure on the wound.  They seem to  understand, express gratitude for having been seen in the clinic and  indicate that they will be compliant.   The HEENT exam today showed a reaccumulation of ear wax on the left but  the right tympanic membrane is clearly visible. The patient is denying  pressure symptoms upon dives.      [name] [name], M.D.  Electronically Signed     HAN/MEDQ  D:  10/14/2006  T:  10/14/2006  Job:  [id number]

## 2010-08-06 NOTE — Assessment & Plan Note (Signed)
[facility]   NAME:  [name] [name], TUCKEY NO.:  [id number]   MEDICAL RECORD NO.:  [id number]      DATE OF BIRTH:  [birth year]   PHYSICIAN:  Maxwell Caul, M.D. VISIT DATE:  09/30/2006                                   OFFICE VISIT   PURPOSE OF TODAY'S VISIT:  Placement of an Apligraf today in the  Wagner's grade 4 wound on the lateral aspect of his left fifth  metatarsal head.  He is to continue with hyperbaric oxygen and  offloading with a Cam walker.  He reports no pain in the area.  He is  not febrile.   WOUND EXAM:  The wound tunnels 2 cm.  The tissue that I see looks well  granulated and clean.  There was no need for debridement and no obvious  evidence of infection.   We placed the Apligraf into the tunnel as far as possible.  We have  covered this with Adaptic nonadherent dressing.  We then used moistened  2x2 to keep the entire apparatus opposed to the wound bed as much as  possible and covered the entire area with Mepitel and wrapped with  Kerlix.   IMPRESSION:  Successful application of an Apligraf to a Wagner's grade 4  diabetic wound as described above.  He will be seen for wound exam in  usual fashion in a week.  He will continue with hyperbaric oxygen.           ______________________________  Maxwell Caul, M.D.     MGR/MEDQ  D:  09/30/2006  T:  09/30/2006  Job:  [id number]

## 2010-08-06 NOTE — Assessment & Plan Note (Signed)
[facility]   NAME:  [name] [name], [name] [name] NO.:  [id number]   MEDICAL RECORD NO.:  [id number]      DATE OF BIRTH:  1945-07-26   PHYSICIAN:  Theresia Majors. Tanda Rockers, M.D. VISIT DATE:  10/27/2006                                   OFFICE VISIT   SUBJECTIVE:  [name] [name] is a 65 year old man who is undergoing  hyperbaric oxygen treatment for a Wagner grade 3 diabetic foot ulcer  involving the left lateral fifth met head.  In the interim, he has  continued with the HBO and daily antiseptic soap washings and  irrigations with tap water and a moist moist dressing.  These wound's  dressings have been changed both at home and in the wound center.  There  has been no excessive drainage, malodor, pain or fever.   OBJECTIVE:  Blood pressure is 118/67, respirations 16, pulse rate 69,  temperature 98.4.  He is accompanied by his wife.  Capillary blood  glucose is 166 mg%.  Inspection of the wound shows that there has been  dramatic decrease in volume.  There is 100% healthy-appearing  granulation.  There is no evidence of necrosis.  Debridement is not  needed.  The wound was measured, photographed and cataloged, please  refer to the entry.   ASSESSMENT:  Clinical response to continued hyperbaric oxygen.   PLAN:  We will continue his HBO and reevaluate the wound in one week.      Harold A. Tanda Rockers, M.D.  Electronically Signed     HAN/MEDQ  D:  10/27/2006  T:  10/27/2006  Job:  [id number]

## 2010-08-06 NOTE — Assessment & Plan Note (Signed)
[facility]   NAME:  [name], [name] NO.:  [id number]   MEDICAL RECORD NO.:  [id number]      DATE OF BIRTH:  01/27/1946   PHYSICIAN:  Theresia Majors. Tanda Rockers, M.D. VISIT DATE:  10/07/2006                                   OFFICE VISIT   SUBJECTIVE:  [name] [name] is 65 year old man who is undergoing hyperbaric  oxygen treatment for Wagner grade 3 diabetic foot ulcer involving his  left lower extremity.  In the interim he has had an Apligraf placed 1  week ago. He returns for followup.  There is been moderate drainage,  mild malodor, but no fever, and no ascending pain.   OBJECTIVE:  VITAL SIGNS: Blood pressure is 105/67, respirations 18,  pulse rate 72, temperature 97.1.  Inspection of the left lateral foot  shows that the fifth met head is covered by the Apligraf. There is scant  drainage.  No evidence of infection and no excessive malodor. The wound  appears to be normal 1 week following Apligraf.  Wound #2 on the left  heel was covered by intense desquamation. A short debridement of the  desquamated area was performed disclosing a frank ulceration of 0.7 cm  diameter.  Necrotic tissue was debrided from the periphery using a sharp  technique with hemorrhage control with silver nitrate.   ASSESSMENT:  1. Normal-appearing Apligraf 1 week post application.  2. Adequately-debrided left heel ulcer.   PLAN:  We will continue HBO and offloading using healing sandal.  We  placed the patient in a Silvercel dressing with 4x4 for number wound #1.  We have replaced Kerlix and Ace wrap with 4x4s for wound #2 we will re-  evaluate the patient in 1 week.      Harold A. Tanda Rockers, M.D.  Electronically Signed     HAN/MEDQ  D:  10/07/2006  T:  10/07/2006  Job:  [id number]

## 2010-08-06 NOTE — Assessment & Plan Note (Signed)
[facility]   NAME:  [name] [name], [name] [name] NO.:  [id number]   MEDICAL RECORD NO.:  [id number]      DATE OF BIRTH:  23-Jun-1945   PHYSICIAN:  Theresia Majors. Tanda Rockers, M.D. VISIT DATE:  08/26/2006                                   OFFICE VISIT   SUBJECTIVE:  [name] [name] is a 65 year old man who we are following for a  Wagner grade 3 diabetic foot ulcer.  He is concurrently undergoing  hyperbaric oxygen treatment.  He has completed a total of 21 dives.  He  is here for wound evaluation today.  During the interim, he has been  seen continuously by his primary care physician, most recently last  week.  His diabetes is under reasonable control.  He denies excessive  drainage, malodor, pain, or fever.   OBJECTIVE:  Blood pressure is 128/74, respirations 16, pulse rate 70,  temperature is 98.1.  He is accompanied by his wife.  His capillary  blood glucose is 163 mg%.   Inspection of the left lower extremity shows that there is trace edema  with chronic stasis changes and hyperpigmentation.  There is no evidence  of ascending infection and lymphangitis or abscess.  Wound #1 on the  lateral fifth metatarsal head is associated with a halo of necrosis that  extends down to the joint capsule.  This area underwent a full-thickness  debridement including remnants of the articular cartilage as well as  periosteum and fascia.  Thereafter, the wound was copiously irrigated,  and a silver matrix dressing was placed into the wound along with  hydrogel.  Similarly, wound #2 is a full-thickness wound with a moderate  serous exudate but no evidence of infection.  No debridement was needed  on wound #2.  The pedal pulses remained indeterminate.  The capillary  refill is sluggish and consistent with his defined vascular state.   ASSESSMENT:  Clinical improvement with hyperbaric oxygen.   PLAN:  We will continue this hyperbaric oxygen treatment.  We will  reevaluate the wound  in 5 days or sooner if excessive drainage or fever  occurs.  The wound has been cultured, and we have started him  empirically on Septra DS 1 p.o. b.i.d. along with doxycycline 100 mg  p.o. b.i.d.  He will continue hyperbaric oxygen.      Harold A. Tanda Rockers, M.D.  Electronically Signed     HAN/MEDQ  D:  08/26/2006  T:  08/26/2006  Job:  [id number]

## 2010-08-06 NOTE — Assessment & Plan Note (Signed)
[facility]                            CARDIOLOGY OFFICE NOTE   RIP, HAWES                      MRN:          [id number]  DATE:10/12/2006                            DOB:          [birth year]    Mr. Eisemann is a pleasant gentleman who has a history of moderate  coronary disease by catheterization.  This was performed on April 03, 1998.  At that time he was found to have a 25% LAD.  The circumflex had  luminal irregularities.  There was a proximal 50% right coronary artery,  and a mid 40% stenosis.  There was a distal 50% stenosis before the PDA.  Ejection fraction was normal.  Since I last saw him, he states he  occasionally has a soreness in his chest.  However, he has not had  exertional chest pain.  There is no dyspnea, palpitations, or syncope.   PRESENT MEDICATIONS:  1. Doxycycline.  2. Septra.  3. __________ .  4. Amlodipine 10 mg p.o. daily.  5. Plavix 75 mg p.o. daily.  6. Pravachol 40 mg p.o. daily.  7. Carvedilol 12.5 mg p.o. b.i.d.  8. Avalide 30/25 daily.  9. Prevacid.  10.Nasonex.  11.Insulin.   PHYSICAL EXAMINATION:  VITAL SIGNS:  Shows a blood pressure of 131/77  and his pulse is 66.  NECK:  Supple with no bruits.  HEENT:  Normal.  CHEST:  Clear.  CARDIOVASCULAR:  Reveals a regular rate and rhythm.  ABDOMEN:  Benign.  EXTREMITIES:  He is status post right AKA.  His left lower extremity  is  being treated for a diabetic ulcer.   His electrocardiogram today shows a sinus rhythm at a rate of 67.  There  was a right bundle branch block.  There is left axis deviation.   DIAGNOSES:  1. Atypical chest pain.  We will plan to risk stratify with an      adenosine Myoview.  If he shows normal perfusion, we will continue      with medical therapy.  2. Coronary artery disease.  We will continue with his beta-blocker,      Statin, and Plavix, as well as his ARB.  3. Hyperlipidemia.  We will check lipids and liver and  adjust with a      goal LDL of less than 70.  4. Diabetes mellitus.  5. History of peripheral vascular disease.   I will see him back in 9 months.  Note, we will also check a B-MET to  follow his potassium and renal function.  He will continue with risk  factor modification.     Madolyn Frieze Jens Som, MD, [facility]  Electronically Signed    BSC/MedQ  DD: 10/12/2006  DT: 10/12/2006  Job #: [id number]

## 2010-08-06 NOTE — Assessment & Plan Note (Signed)
[facility]   NAME:  GARIN, MATA NO.:  [id number]   MEDICAL RECORD NO.:  [id number]      DATE OF BIRTH:  Feb 12, 1946   PHYSICIAN:  Theresia Majors. Tanda Rockers, M.D. VISIT DATE:  11/06/2006                                   OFFICE VISIT   SUBJECTIVE:  Mr. Azure is a 65 year old man whom we have treated for a  complex Wagner grade 3 diabetic foot ulcer.  He has undergone a total of  70 HBO treatments and returns for followup.  In the interim he has worn  a Cam walker for offloading and has been restricted to the wheelchair.  His wife is helping him in daily cleaning of the wound with tap water  and irrigation.  There has been no excessive drainage, no malodor, no  pain and no fever.   OBJECTIVE:  His vital signs are stable.  He is afebrile.  Inspection of the lateral wound shows that there is 100% granulation.  There is no drainage.  There is no evidence of infection.  The periwound  tissue appears normal and conducive to primary healing.   ASSESSMENT:  Satisfactory progress with Wagner grade 3 diabetic foot  ulcer.   PLAN:  We will continue daily home washes with antiseptic soap,  irrigations with tap water, and cleansing with a Q-tip.  The patient  will continue offloading with a Cam walker.  We will reevaluate the  patient weekly to assess his continued response and contraction of the  wound.  If there is any retrogression we will reconsider hyperbaric  oxygen treatment.   We have explained this approach to the patient and his wife in terms  that they seem to understand.  They express gratitude for having been  seen in the clinic and indicate that they will be compliant as per  above.      Harold A. Tanda Rockers, M.D.  Electronically Signed     HAN/MEDQ  D:  11/06/2006  T:  11/06/2006  Job:  [id number]

## 2010-08-07 ENCOUNTER — Other Ambulatory Visit (INDEPENDENT_AMBULATORY_CARE_PROVIDER_SITE_OTHER): Payer: Medicare Other | Admitting: *Deleted

## 2010-08-07 DIAGNOSIS — J302 Other seasonal allergic rhinitis: Secondary | ICD-10-CM

## 2010-08-07 DIAGNOSIS — J309 Allergic rhinitis, unspecified: Secondary | ICD-10-CM

## 2010-08-08 MED ORDER — LORATADINE 10 MG PO TABS: 10.0000 mg | ORAL_TABLET | Freq: Every day | ORAL | Status: AC

## 2010-08-09 NOTE — Op Note (Signed)
NAME:  [name] [name], [name] [name] NO.:  [id number]   MEDICAL RECORD NO.:  [id number]          PATIENT TYPE:  INP   LOCATION:  SDS                          FACILITY:  [facility]   PHYSICIAN:  Nadara Mustard, MD     DATE OF BIRTH:  [birth year]   DATE OF PROCEDURE:  12/05/2004  DATE OF DISCHARGE:                                 OPERATIVE REPORT   PREOPERATIVE DIAGNOSIS:  Gangrene with abscess of right forefoot.   POSTOPERATIVE DIAGNOSIS:  Gangrene with abscess of right forefoot.   PROCEDURE:  Right below-the-knee amputation.   SURGEON.:  Nadara Mustard, MD   ANESTHESIA:  General.   ESTIMATED BLOOD LOSS:  Minimal.   ANTIBIOTICS:  One gram of Kefzol.   TOURNIQUET TIME:  Ten minutes at 350 mmHg.   DISPOSITION:  To PACU in stable condition, leg sent to Pathology.   INDICATION FOR PROCEDURE:  The patient is a 65 year old gentleman with  severe peripheral vascular disease.  The patient does not have  revascularable vessels from the popliteal fossa distally as per CVTS.  The  patient was evaluated; he had thin atrophic skin in the right leg, has  ischemic changes in the right foot with abscess ulcer and ischemic changes  to the forefoot and presents at this time for a right below-the-knee  amputation.  The patient was given the option of foot salvage with midfoot  amputation versus below-the-knee amputation, and the patient wished proceed  with below-the-knee amputation.  Risks and benefits were discussed including  infection, neurovascular injury, persistent pain, nonhealing of the wound,  need for additional surgery.  The patient states he understands and wishes  to proceed at this time.   DESCRIPTION OF PROCEDURE:  The patient was brought to OR 1 and underwent a  general anesthetic.  After adequate level of anesthesia was obtained, the  patient's right lower extremity was prepped using DuraPrep and draped into a  sterile field and the  foot was draped into an impervious  stockinette.  The  tourniquet was inflated to 350 mmHg.  A transverse incision was made 10 cm  distal to the tibial tubercle; this curved proximally and a large posterior  flap was created.  The tibia was transected 1 cm proximal to the skin  incision and the anterior aspect was beveled.  The fibula was transected  just proximal to the tibia.  The sciatic nerve was pulled, cut and allowed  to retract.  The vascular bundles were suture-ligated x3 each.  The  tourniquet was deflated after 10 minutes.  Hemostasis was obtained.  The  muscle had good petechial bleeding, good color, good consistency and good  contractility.  The deep and superficial fascial layers were closed using #1  PDS.  The skin was closed using approximated staples.  The wound was covered  with Adaptic, orthopedic sponges, sterile Webril and a Coban dressing.  The  patient was extubated and taken to PACU in stable condition.      Nadara Mustard, MD  Electronically Signed     MVD/MEDQ  D:  12/05/2004  T:  12/06/2004  Job:  [id number]

## 2010-08-09 NOTE — Consult Note (Signed)
NAME:  [name] [name], [name] [name] NO.:  [id number]   MEDICAL RECORD NO.:  [id number]          PATIENT TYPE:  REC   LOCATION:  FOOT                         FACILITY:  [facility]   PHYSICIAN:  Theresia Majors. Tanda Rockers, M.D.DATE OF BIRTH:  10/20/1945   DATE OF CONSULTATION:  07/14/2006  DATE OF DISCHARGE:                                 CONSULTATION   REASON FOR CONSULTATION:  Mr. Dowdell is a 65 year old diabetic who is  referred from the [facility] via Dr. Sherlon Handing  for evaluation of nonhealing ulcerations on the left foot.   IMPRESSION:  Wagner grade 3 diabetic foot ulcers involving the left  lateral fifth met head and the left heel.   RECOMMENDATIONS:  1. Full-thickness debridements were performed with in the wound center      on the day of his consultation.  2. We recommended that the patient have, in addition, Accuzyme      chemical debridement to the left lateral fifth met head wound and      we will dress him with an Aquacel silver dressing to the left heel      and reevaluate him in 2 days as we anticipate that he will need      additional debridement.  3. We will retrieve his database from [facility] to include signal      arterial Dopplers, arteriographic reports, vascular surgical      consultation.  After review of his database, the patient may likely      be a candidate for hyperbaric oxygen for the management and      treatment of a Wagner grade 3 diabetic foot ulcer.  We will      schedule the patient for a transcutaneous oxygen determination to      determine the presence of concurrent ischemia.   SUBJECTIVE:  Mr. Brumett is a 65 year old man who has had diabetes for  over 20 years.  The current wounds have been present for approximately 6  months.  He has been treated with offloading, p.o. antibiotics and  debridements.  The patient has previously been evaluated by Vascular  Surgery and has had an amputation of his right lower extremity at  the  above-the-knee level.  He continues to be under the care of the family  practice clinic for his diabetes and relates that his sugars range from  300-70 based on his compliance.  He has had no interim fever, chills, or  excessive malodor.  The patient denies allergies.   CURRENT MEDICATIONS INCLUDE:  1. Flomax 0.4 daily.  2. NovoLog 70/30 q.a.m.  3. Coreg 25 mg b.i.d.  4. Pravachol 40 mg daily.  5. Ranitidine.  6. Plavix 75 mg daily.  7. Avalide 300 daily.  8. Norvasc 10 mg daily.  9. Cipro 500 mg b.i.d.  10.Glipizide 10 mg b.i.d.  11.Ethezyme daily.  12.Lantus sliding scale.  13.Terazosin 2 mg daily.   PAST SURGICAL HISTORY:  Has included the above-knee amputation 2 years  ago, coronary stents were placed 10 years ago for angina.  He has had no  subsequent angina.   FAMILY  HISTORY:  Is positive for diabetes, hypertension, stroke, and  heart attack.   SOCIALLY:  He is married.  He has four children who live locally.  He is  medically disabled due to his diabetes and amputation.   REVIEW OF SYSTEMS:  Is remarkable for visual changes related to his  diabetes.  He denies angina pectoris.  His activity is limited to a  walker and within the house.  Although he has a fitting prosthesis, he  does not take long walks.  His bowel function is normal.  He does report  some urinary hesitancy.  His weight has been stable.  The remainder of  the review of systems is negative.  The patient denies transient  paralysis, speech impediments, or stigmata of TIAs.   PHYSICAL EXAM:  He is an alert, cooperative man.  He is accompanied by  his wife.  He appears to be in good contact with reality and answers  questions appropriately.  VITAL SIGNS:   DICTATION ENDS HERE.....      Harold A. Tanda Rockers, M.D.  Electronically Signed     HAN/MEDQ  D:  07/14/2006  T:  07/14/2006  Job:  [id number]

## 2010-08-09 NOTE — Op Note (Signed)
NAME:  [name] [name], KEEP NO.:  [id number]   MEDICAL RECORD NO.:  [id number]          PATIENT TYPE:  INP   LOCATION:  6740                         FACILITY:  [facility]   PHYSICIAN:  Di Kindle. Edilia Bo, M.D.DATE OF BIRTH:  12-04-1945   DATE OF PROCEDURE:  03/09/2006  DATE OF DISCHARGE:                               OPERATIVE REPORT   PREOPERATIVE DIAGNOSIS:  Ischemia left leg.   POSTOPERATIVE DIAGNOSIS:  Ischemia left leg.   PROCEDURES:  Left femoral arteriogram.   SURGEON:  Dr. Edilia Bo.   ANESTHESIA:  Local with sedation.   TECHNIQUE:  The patient was taken to the PV lab at [facility] and sedated with  1 mg of Versed and 15 mcg of fentanyl.  The left groin was prepped and  draped in usual sterile fashion.  After the skin was infiltrated with 1%  lidocaine the left common femoral artery was cannulated and guidewire  introduced into the infrarenal aorta under fluoroscopic control.  A 5-  French sheath was introduced over the wire and the wire and dilator were  then removed.  Left femoral arteriogram was obtained through the left  femoral sheath.   FINDINGS:  The external, common femoral, and deep femoral arteries are  all widely patent on the left.  The superficial femoral artery and  popliteal artery is widely patent.  There is some mild disease of the  popliteal artery at the level of the knee that this not appear to be  flow-limiting.  The below-knee popliteal artery is widely patent.  The  anterior tibial artery is patent proximally with some mild proximal  disease and more moderate diffuse disease distally.  There is a sub  total occlusion distally of the anterior tibial artery with  reconstitution of the distal anterior tibial and dorsalis pedis artery.  Peroneal arteries diffusely diseased and small.  The posterior tibial  artery is patent although somewhat small.  It has a short segment  occlusion distally but then reconstitution but then the tarsal branch  is  severely diseased.   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      Di Kindle. Edilia Bo, M.D.  Electronically Signed     CSD/MEDQ  D:  03/09/2006  T:  03/09/2006  Job:  [id number]

## 2010-08-09 NOTE — Op Note (Signed)
NAME:  [name] [name], [name] [name] NO.:  [id number]   MEDICAL RECORD NO.:  [id number]                   PATIENT TYPE:  INP   LOCATION:  2114                                 FACILITY:  [facility]   PHYSICIAN:  Ollen Gross. Vernell Morgans, M.D.              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   PROCEDURE:  Incision and debridement of right buttock area.   SURGEON:  Ollen Gross. Carolynne Edouard, M.D.   ANESTHESIA:  General endotracheal anesthesia.   DESCRIPTION OF PROCEDURE:  After informed consent was obtained the patient  was brought to the operating room and placed in the supine position on the  operating table. After the adequate induction of general anesthesia the  patient was placed in the lithotomy position and his perirectal area was  prepped with Betadine and draped in the usual sterile manner.  The patient  had a large necrotic, foul wound encompassing a large portion of his right  buttock.  This wound was sharply debrided with the Bovie electrocautery, a  15 blade knife and Metzenbaum scissors.  Care was taken to try to avoid  injury to the rectum.  The wound was cleaned up about as much as could be  tolerated during this sitting.  Once the wound seemed to be much cleaner,  the wound was packed with moist Kerlix gauze and sterile dressings were  applied.  The patient tolerated the procedure well. At the end of the case,  all needle, sponge and instrument counts were correct.  The patient was then  awakened and taken to the recovery room in stable condition.                                               Ollen Gross. Vernell Morgans, M.D.    PST/MEDQ  D:  08/01/2003  T:  08/01/2003  Job:  [id number]

## 2010-08-09 NOTE — H&P (Signed)
NAME:  [name] [name], GASSETT NO.:  [id number]   MEDICAL RECORD NO.:  [id number]                   PATIENT TYPE:  EMS   LOCATION:  MINO                                 FACILITY:  [facility]   PHYSICIAN:  Ollen Gross. Vernell Morgans, M.D.              DATE OF BIRTH:  [birth year]   DATE OF ADMISSION:  08/01/2003  DATE OF DISCHARGE:                                HISTORY & PHYSICAL   HISTORY OF PRESENT ILLNESS:  Mr. Murcia is a 65 year old black male who  states that for about the last week or two he has been having pain,  swelling, and drainage on his right buttock.  He has not had much of an  appetite, and has run some fevers at home.  There was an attempt to lance  the wound last week at urgent care that was unsuccessful, and since that  time the wound has been draining a lot of foul-smelling fluid.  He otherwise  denies chest pain, shortness of breath, diarrhea, dysuria.  His other review  of systems is unremarkable.   PAST MEDICAL HISTORY:  Significant for diabetes and hypertension.   PAST SURGICAL HISTORY:  Significant for I&D of his buttock in the past.   MEDICATIONS:  1. Lisinopril.  2. Metformin.   ALLERGIES:  No known drug allergies.   SOCIAL HISTORY:  He is a former drinker and smoker, but does not use those  substances anymore.   FAMILY HISTORY:  Noncontributory.   PHYSICAL EXAMINATION:  VITAL SIGNS:  He is afebrile with stable vitals.  His  temperature is 98.6, blood pressure 154/85, pulse 87.  GENERAL:  He is a well-developed, well-nourished, black male in no acute  distress.  SKIN:  Warm and dry with no jaundice.  HEENT:  Eyes - extraocular muscles are intact.  Pupils equal, round and  reactive to light.  Sclerae are anicteric.  LUNGS:  Clear bilaterally with no use of accessory respiratory muscles.  HEART:  Regular rate and rhythm with an impulse in the left chest.  ABDOMEN:  Soft and nontender.  No palpable mass or hepatosplenomegaly.  EXTREMITIES:   No clubbing, cyanosis, or edema.  PSYCHOLOGIC:  Alert and oriented x3 with no evidence of anxiety or  depression.  RECTAL:  He has a large area of necrosis and foul-smelling drainage on his  right buttock.  White count is pending.   ASSESSMENT AND PLAN:  This is a 65 year old black male diabetic with a very  large necrotic abscess on his right buttock.  This will require drainage and  debridement in the operating room.  I have discussed with him and his family  the risks and benefits of the operation, as well as some of the technical  aspects, and they understand and wish to proceed.  We will plan to do this  for him in the next couple hours in the operating room.  Ollen Gross. Vernell Morgans, M.D.    PST/MEDQ  D:  08/01/2003  T:  08/01/2003  Job:  [id number]

## 2010-08-09 NOTE — Cardiovascular Report (Signed)
NAME:  [name] [name], [name] [name] NO.:  [id number]   MEDICAL RECORD NO.:  [id number]          PATIENT TYPE:  INP   LOCATION:  6599                         FACILITY:  [facility]   PHYSICIAN:  Carole Binning, M.D. LHCDATE OF BIRTH:  Aug 24, 1945   DATE OF PROCEDURE:  04/24/2004  DATE OF DISCHARGE:                              CARDIAC CATHETERIZATION   PROCEDURE PERFORMED:  1.  Left heart catheterization with coronary angiography and left      ventriculography.  2.  Percutaneous coronary intervention with high-speed rotational      atherectomy followed by placement of two bare metal stents in the mid      right coronary.  3.  Temporary pacemaker placement right in the right ventricle.   INDICATIONS:  [name] [name] is a 65 year old male with diabetes and multiple  cardiac risk factors. He was admitted with unstable angina and referred for  cardiac catheterization.   CATHETERIZATION PROCEDURE NOTE:  A 6-French sheath was placed in the left  femoral artery. Coronary angiography was performed with standard Judkins 6-  French catheters. Left ventriculography was performed with an angled pigtail  catheter. Contrast was Omnipaque. There no complications.   RESULTS:  Hemodynamics:  Left ventricular pressure 140/12, aortic pressure  140/82. There is no aortic valve gradient.   Left ventriculogram:  There is very mild akinesis of anterolateral wall as  well as the inferior apical wall. Otherwise wall motion is normal. Ejection  fraction estimated at 60%. There is trace mitral regurgitation.   Coronary arteriography:  (Coronary arteries are heavily calcified  throughout.)  Left main is normal.  Left anterior descending artery has a tubular 30% stenosis in the proximal  vessel, 50% in mid vessel and 30% in  the distal vessel. In the very apical  portion of the LAD there is a 90% stenosis. The vessel is very small at that  point. The LAD gives rise to two small diagonal branches.  Left  circumflex is a moderately tortuous vessel. It gives rise to very small  caliber first and second obtuse marginal branches and a very large branch in  the third obtuse marginal. In the small second obtuse marginal there is a  70% stenosis. In the large third obtuse marginal there is a diffuse 30%  stenosis.  Right coronary is a is a dominant vessel, but relatively small. There is  diffuse calcium throughout the proximal, mid and distal vessel. In the  proximal right coronary artery there is a 30% stenosis followed by a 40 to  50% stenosis in the mid vessel. Further down in the distal portion of the  mid vessel at the acute margin there is a long 95% stenosis. Vessel again is  heavily calcified throughout including the area of the 95% stenosis. In the  distal right coronary artery.  Proximal to the posterior descending artery  there is a 30-40% stenosis. The distal right coronary gives rise to a normal  size posterior descending artery to small posterolateral branches.   IMPRESSION:  1.  Left ventricular systolic function characterized by mild wall motion      abnormalities as  described but overall normal ejection fraction.  2.  Two-vessel coronary artery disease. There is critical disease in the      right coronary artery. There is moderate but nonobstructive disease in      the mid left anterior descending artery. There is also small vessel      disease as described.   PLAN:  Percutaneous intervention right coronary.  See below.   PTCA PROCEDURE NOTE:  Following completion of the diagnostic  catheterization, we opted to proceed with percutaneous coronary  intervention. The preexisting 6-French sheath in the left femoral artery was  exchanged over wire for 7-French sheath. A 6-French sheath was placed in the  right femoral vein. Heparin and Integrilin were administered per protocol.  Because of the heavy calcification right coronary artery, we opted to  proceed with high-speed  rotational atherectomy. We therefore administered  intravenous aminophylline to prevent bradycardia. We also placed a temporary  pacemaker via the right femoral vein in the right ventricular apex. We used  a 7 [place] ART 3.5 guiding catheter with side holes. A Rota-floppy wire was  advanced under fluoroscopic guidance into a distal vessel. However, because  of this significant tortuosity and calcium in the proximal vessel, we could  not advance this wire far enough to provide [place] adequate support for a  Rotablator. We therefore advanced a Luge wire alongside this wire to use as  a  buddy wire to help advance the Rota-floppy wire. We then advanced a  transit catheter over a Rota-floppy wire and using this catheter as back up,  we were able to advance the Rota-floppy wire into the distal vessel. We then  removed the Luge wire. We then performed high-speed rotational atherectomy.  We first used a 1.25 mm bur. Required a total of four passes 170,000 rpm.  There was significant resistance in the lesion, but the burr was finally  able to cross on the third pass. We then performed high speed rotational  arthrectomy with a 1.5 mm bur to a 170,000 rpm for two passes. Following  this, we re-advanced Luge wire into the distal vessel. We then advanced a  2.5 x  15 mm Quantum balloon and positioned this across the distal aspect of  the lesion, inflated 12 atmospheres. We then pulled this balloon slightly  back proximally, inflated to 12 atmospheres. Following this, we attempted to  a advanced a 2.5 x 24 mm TAXUS drug-eluting stent. However, due to a  significant tortuosity and calcium of the proximal vessel we could not  advance the stent to the lesion. We then advanced a Yahoo wire to use  as a buddy wire. We again attempted to advance our stent over both the [facility] wire and the Luge wire and were not able to advance it quite far enough to cover the length of disease. We therefore advanced a  2.5 x 23 mm Mini-  Vision stent and were able to pass this far enough to cover the distal  length of the lesion. This stent was passed over the Luge wire. We removed  our other wires from the body. The stent was then deployed at 11  atmospheres. We then pulled our stent delivery balloon back a few  millimeters and inflated to 14 atmospheres. Following this, we advanced a  2.5 x 15 mm Quantum balloon and inflated this to 16 atmospheres in the  distal aspect of stent and 18 atmospheres in the proximal aspect of stent.  Angiographic images at  that point revealed an excellent result at the stent  site. However, just proximal to the stent, there is a persistent filling  defect which may have represented a tissue flap. We therefore advanced a 3.0  x 8 mm Vision stent to cover this area with minimal overlap of the stent  already placed. This stent was deployed just proximal to the previous stent  with  1 to 2 mm of stent overlap. The deployment pressure was 16  atmospheres. We then went back with a 3.0 x 12 mm Quantum balloon and  inflated this to 14 atmospheres in the area of stent overlap and 14  atmospheres in the proximal aspect of stent. Intermittent doses of  intracoronary nitroglycerin were administered. Final angiographic images  were obtained showing patency of the right coronary artery with 0% residual  stenosis at the stent site and TIMI-3 flow into the distal vessel.   COMPLICATIONS:  None.   RESULTS:  Very difficult and complex but successful percutaneous coronary  intervention of the right coronary artery. High-speed rotational atherectomy  was performed followed by placement of two overlapping bare metal stents. A  long 95% stenosis with heavy calcification was reduced to 0% residual with  TIMI-3 flow.   PLAN:  Integrilin will be continued for 18 hours. The patient will be  treated with  Plavix for minimum of one month, however, preferentially nine  to 12 months for his acute  coronary syndrome and diffuse coronary disease.  Would recommend medical therapy for the residual coronary artery disease.      MWP/MEDQ  D:  04/24/2004  T:  04/24/2004  Job:  [id number]   cc:   Olga Millers, M.D. [facility]

## 2010-08-09 NOTE — Op Note (Signed)
NAME:  [name] [name], DEBOY NO.:  [id number]   MEDICAL RECORD NO.:  [id number]          PATIENT TYPE:  INP   LOCATION:  5002                         FACILITY:  [facility]   PHYSICIAN:  Nadara Mustard, MD     DATE OF BIRTH:  Jun 19, 1945   DATE OF PROCEDURE:  01/24/2005  DATE OF DISCHARGE:                                 OPERATIVE REPORT   PREOPERATIVE DIAGNOSIS:  Ischemic, necrotic with abscess, right below-knee  amputation.   POSTOPERATIVE DIAGNOSIS:  Ischemic, necrotic with abscess, right below-knee  amputation.   PROCEDURE:  Right above-the-knee amputation.   SURGEON:  Nadara Mustard, MD.   ANESTHESIA:  General.   DRAINS:  None.   COMPLICATIONS:  None.   TOURNIQUET TIME:  None.   ANTIBIOTICS:  One gram of Kefzol.   SPECIMENS:  Leg sent to Pathology.   DISPOSITION:  To PACU in stable condition.   INDICATION FOR PROCEDURE:  The patient is a 65 year old gentleman with  diabetic insensate neuropathy.  He is status post a right BKA with revision.  The patient has had a fall on the stump and has had a breakdown and necrosis  of the stump with three-quarters of the wound completely dehisced with  purulent drainage.  The patient has failed further conservative care  presents at this time for above-the-knee amputation.  The risks and benefits  were discussed including infection, neurovascular injury, nonhealing of the  wound, need for additional surgery.  The patient states he understands and  wishes to proceed at this time.   DESCRIPTION OF PROCEDURE:  The patient was brought to OR room 1 and  underwent general anesthetic.  After adequate level of anesthesia was  obtained, the patient's right lower extremity was prepped using DuraPrep,  draped into a sterile field and the necrotic foot was draped out of the  field with Ioban drape.  A fishmouth incision was made at the level of the  patella.  This was carried down and the femur was transected out just  proximal to  the flare of the femur.  The vascular bundles were clamped with  a hemostat and a large posterior flap was created.  The vascular bundles  were suture-ligated x3 each.  Hemostasis was obtained.  The wound was  irrigated.  The deep and superficial fascial layers were closed using #1  PDS.  The skin was closed using approximated staples; there was no  tension on the skin.  The wound was covered with Adaptic, orthopedic  sponges, ABD dressing, Webril and Coban.  Patient was extubated and taken to  the recovery room in stable condition.   Nadara Mustard, MD  Electronically Signed     MVD/MEDQ  D:  01/24/2005  T:  01/25/2005  Job:  [id number]

## 2010-08-09 NOTE — Discharge Summary (Signed)
NAME:  [name] [name], [name] [name] NO.:  [id number]   MEDICAL RECORD NO.:  [id number]          PATIENT TYPE:  INP   LOCATION:  5009                         FACILITY:  [facility]   PHYSICIAN:  Nadara Mustard, MD     DATE OF BIRTH:  November 03, 1945   DATE OF ADMISSION:  12/05/2004  DATE OF DISCHARGE:  12/09/2004                                 DISCHARGE SUMMARY   DIAGNOSIS:  Gangrene right foot.   PROCEDURE:  Right below the knee amputation.   CONDITION ON DISCHARGE:  Discharged to home in stable condition.   HISTORY OF PRESENT ILLNESS:  The patient is a 65 year old gentleman with  severe peripheral vascular disease. He was not felt to be revascularizations  candidate by the cardiothoracic surgeon. The patient had no flow distal to  the popliteus. He had no palpable pulses and he was scheduled at this time  for foot salvage versus below-the-knee amputation.   HOSPITAL COURSE:  The patient underwent a right below-knee amputation on  December 05, 2004 due to nonviable forefoot and hind foot. The patient  received Kefzol for infection prophylaxis. Postoperative day #1, the patient  was stable. He was started with physical therapy for progressive ambulation,  nonweightbearing on the right. [employer] was consulted for a amputee education.  The patient progressed well with physical therapy and was discharged to home  in stable condition on December 09, 2004 with follow-up in the office in  two weeks. The patient was set up with home health physical therapy as well  as durable medical equipment.      Nadara Mustard, MD  Electronically Signed     MVD/MEDQ  D:  02/11/2005  T:  02/11/2005  Job:  [id number]

## 2010-08-09 NOTE — Assessment & Plan Note (Signed)
[facility]   NAME:  [name] [name], [name] [name] NO.:  [id number]   MEDICAL RECORD NO.:  [id number]      DATE OF BIRTH:  Sep 29, 1945   PHYSICIAN:  Theresia Majors. Tanda Rockers, M.D. VISIT DATE:  07/29/2006                                   OFFICE VISIT   SUBJECTIVE:  [name] [name] is a 65 year old diabetic, who has currently  undergone hyperbaric oxygen treatment for a Wagner grade 3 diabetic foot  ulcer.  He is seen today for his wound evaluation.  He has completed his  fourth dive without incident.  He denies excessive ear pain.  He has had  no respiratory symptoms.  He remains afebrile.  There has been some mild  malodor at home.  He continues to wear a Profore boot and to use his  wheelchair exclusively to provide for total offloading.   OBJECTIVE:  Blood pressure is 120/70, respirations 16, pulse rate 72,  temperature is 98.1, capillary blood glucose is 127 mg%.   Inspection of the wounds of the left foot shows that the lateral fifth  metatarsal head wound had areas of necrosis, which underwent full-  thickness debridement utilizing a 10 blade.  The resultant hemorrhage  was controlled with direct pressure.  Similarly, the left heel was  debrided of all nonviable tissue with hemorrhage control with direct  pressure.  These wounds were photographed, measured, and cataloged.   ASSESSMENT:  Clinical improvement, adequately debrided.   PLAN:  We are referring [name] [name] to ENT for evacuation of cerumen  from his external auditory canals.  We are unable to completely  visualize his tympanic membranes.  We will continue him on total  wheelchair use to offload, and we will continue the Profore boot to  avoid pressure.  He will continue hyperbaric treatment, and we will  reevaluate him in 1 week.      Harold A. Tanda Rockers, M.D.  Electronically Signed     HAN/MEDQ  D:  07/29/2006  T:  07/29/2006  Job:  [id number]

## 2010-08-09 NOTE — Op Note (Signed)
NAME:  [name] [name], [name] [name] NO.:  [id number]   MEDICAL RECORD NO.:  [id number]          PATIENT TYPE:  AMB   LOCATION:  SDS                          FACILITY:  [facility]   PHYSICIAN:  Nadara Mustard, MD     DATE OF BIRTH:  Mar 11, 1946   DATE OF PROCEDURE:  01/10/2005  DATE OF DISCHARGE:                                 OPERATIVE REPORT   PREOPERATIVE DIAGNOSIS:  Dehiscence, right below-knee amputation.   POSTOPERATIVE DIAGNOSIS:  Dehiscence, right below-knee amputation.   PROCEDURE:  Revision of right below-the-knee amputation.   SURGEON.:  Nadara Mustard, MD   ANESTHESIA:  General.   ESTIMATED BLOOD LOSS:  Minimal.   ANTIBIOTICS:  One gram of Kefzol.   DRAINS:  None.   COMPLICATIONS:  None.   TOURNIQUET TIME:  None.   DISPOSITION:  To PACU in stable condition.   INDICATION FOR PROCEDURE:  The patient is a 65 year old gentleman who is  status post a right BKA.  The patient has fallen twice on his stump and most  recently this caused a complete dehiscence of the below-the-knee amputation.  The patient presents at this time for revision of the right BKA.  The risks  and benefits were discussed including infection, neurovascular injury,  persistent pain, nonhealing of the wound and need for additional surgery.  The patient states he understands and wishes to proceed at this time.   DESCRIPTION OF PROCEDURE:  The patient was brought to OR room #2 and  underwent a general anesthetic.  After adequate level of anesthesia was  obtained, the patient's right lower extremity was prepped using DuraPrep and  draped into a sterile field.  The dehisced part of the wound was ellipsed  out in 1 block of tissue.  The tibial resection was revised approximately 2  cm.  The fibula resection was also revised 2 cm.  The wound was cleansed.  There was viable, healthy tissue.  There was good petechial bleeding.  Hemostasis was obtained.  The deep and superficial fascial layers were  closed using #1 PDS.  The skin was closed using approximated staples.  The  wound was covered with Adaptic, orthopedic sponges, sterile Webril and a  Coban dressing.  The patient was extubated and taken to the PACU in stable  condition.   Plan for discharge to home.  Prescription for Keflex 500 mg p.o. 3 times  daily for 3 weeks.  The patient has a prescription at home for Vicodin.  Plan to follow up in the office in 2 weeks.      Nadara Mustard, MD  Electronically Signed     MVD/MEDQ  D:  01/10/2005  T:  01/10/2005  Job:  [id number]

## 2010-08-09 NOTE — Discharge Summary (Signed)
NAME:  [name] [name], DORIS NO.:  [id number]   MEDICAL RECORD NO.:  [id number]          PATIENT TYPE:  INP   LOCATION:  6740                         FACILITY:  [facility]   PHYSICIAN:  Artist Beach, MD        DATE OF BIRTH:  January 15, 1946   DATE OF ADMISSION:  03/06/2006  DATE OF DISCHARGE:  03/12/2006                               DISCHARGE SUMMARY   DISCHARGE DIAGNOSIS:  1. Left heel ulcer, likely secondary to diabetes.  2. Diabetes, hemoglobin A1c 10.8.  3. Diabetic neuropathy.  4. Diabetic retinopathy.  5. 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  6. Coronary artery disease.  7. Status post percutaneous coronary intervention 2006 with 2 stents.  8. Hypertension.  9. Hyperlipidemia.  10.Gastroesophageal reflux disease.  11.Erectile dysfunction.  12.History of rectal polyp, removed on February 01, 2006, diagnosed on      colonoscopy.   DISCHARGE MEDICATIONS:  1. Lantus 28 units subcu q.h.s.  2. Glipizide 10 mg p.o. b.i.d.  3. Coreg 25 mg p.o. b.i.d.  4. Norvasc 10 mg p.o. daily.  5. Avalide 300/25 p.o. daily.  6. Plavix 75 mg p.o. daily.  7. Aspirin 81 mg p.o. daily.  8. Pravastatin 40 mg p.o. q.h.s.  9. Terazosin 2 mg p.o. q.h.s.  10.Ranitidine 150 mg p.o. daily.  11.Accuzyme ointment 30 g apply to wound once a day.   DISPOSITION AND FOLLOWUP:  [name] [name] was treated in the hospital for  his diabetic ulcer in his heel.  He was worked up by vascular surgery,  who found that he was not a bypass candidate, and felt that local wound  treatment would be the most beneficial.  The patient was sent home with  Accuzyme ointment with instructions to  change the dressing once a day  and will followup with Dr. Sherlon Handing in the outpatient clinic on March 30, 2006, at 2 p.m.  During his hospitalization, the patient also showed  very treatment resistant hypertension and diabetes, was maxed out on his  hypertension medication; therefore,  Norvasc was added, which seemed to  help somewhat, and we will need to follow up on his blood pressures.  Patient was also found to have a very high blood glucose.  His Glipizide  was increased from 10 mg once a daily to 10 mg twice a day.  In the  future, it may benefit the patient to take both a short and long-acting  insulin twice a day, such as 70/30.  The patient can discuss this with  Dr. Sherlon Handing on his followup visit on March 30, 2006.   During this hospitalization, the patient was also found to have some  left-sided weakness; therefore, an MRI was performed.  It showed that  patient likely had chronic subacute infarct exposed in his left frontal  periventricular white matter and in the right posterior limb of the  internal capsule.  The patient was seen by Dr. Pearlean Brownie in [facility].  The patient was advised to maximize his stroke  prevention measures and to followup  with Dr. Pearlean Brownie once in 2 months.  The patient can call for appointment; phone number is [phone].   PROCEDURE PERFORMED:  1. MRA of head without contrast showed atherosclerotic disease in the      cavernous carotid bilaterally and moderate stenosis to the proximal      right anterior cerebral artery.  Negative for aneurysm.  2. MRI of brain with and without contrast showed infarct, acute versus      chronic in the left central white matter and infarct, acute versus      chronic in left posterior limb of the internal capsule.  Chronic      small-vessel ischemic changes are present in the white matter,      basal ganglia and pons bilaterally.  3. An MRI of the left foot with and without contrast demonstrated      cellulitis, but no discrete soft-tissue abscess or osteomyelitis.      Obtuse abnormal signal intensity in the muscles may suggest      myositis or diabetic muscle infarct.  No fascicle fluid collection      to suggest fasciitis.  4. Echocardiogram on December 19th demonstrated overall  left      ventricular systolic function normal, with ejection fraction of      60%.  The study was inadequate for the evaluation of left      ventricular regional wall motion.  Left ventricular wall thickness      mildly to moderately increased.  Aortic valve thickness was mildly      increased.  There was lower normal aortic valve leaflet excursion.      There was no obvious cardiac source of embolus.  However the atrial      septum cannot be assessed fully in this study.  Carotid Doppler      demonstrated vertebral artery, low antegrade bilaterally, no      significant right or left ICA stenosis noted.  5. ABIs were not able to be performed secondary to calcification of      the left artery but his great toe was found to have a blood      pressure 40 mercury.  6. On March 09, 2006, left femoral arteriogram demonstrated mild      superficial femoral artery and popliteal occlusive disease with      tibial occlusive disease, subtotal occlusion distally of the      anterior tibial artery with reconstruction of the distal anterior      tibial and dorsalis pedis artery.   HISTORY AND PHYSICAL:  [name] [name] is a 65 year old male with a history  of above the knee, right let amputation in November 2006 and fairly  controlled diabetes and hypertension who presented with 2-1/2 months of  nonhealing left heel ulcer after traumatic injury.  Please see history  and physical for further details.  On admission, the patient's heel had  about approximately 2 cm black eschar with 6 mm of fluctuance and gray  skin around it.  Pulses were intact (1+).   ADMISSION LABS:  CBC:  WBC 9.4, hemoglobin 11.4, hematocrit 35.6,  platelets were clumped but count appeared adequate.  CMET:  Sodium 133,  potassium 3.9, chloride 102, bicarb 22, BUN 28, creatinine 1, glucose  252, T-bili of 0.7, alk phos 115, AST 32, ALT 26, total protein 8.1, albumin 2.9, calcium 8.9.  Coags:  PT 13.7, INR 1, PTT 32.   HOSPITAL  COURSE:  1. Left heel ulcer.  The patient was  started on vancomycin and Zosyn      on March 06, 2006.  CVTS was consulted to look at the wound and      decide if debridement or other surgery was necessary.  After left      arteriogram, it was felt that the patient was not a bypass      candidate and would benefit from local wound treatment.  Wound care      taught patient and his wife how to dress the wound with Accuzyme      ointment once a day.  Vancomycin and Zosyn were stopped on February 08, 2006, as the heel ulcer had improved substantially, which was      felt to be secondary to patient keeping weight off of his heel.  PT      was consulted to set up patient for an orthotic boot that would      present further destruction of heel tissue.  The patient will      continue to ambulate with his walker and prosthesis at home but      will avoid putting excessive pressure or spending excessive amount      of time on his heel.  Osteomyelitis was ruled out by MRI.  2. Cerebrovascular accident.  On December 18th, patient stated that he      was having some difficulty with slurred speech and for the last      year, since his AKA.  The patient was found to have a left sided      facial droop and weakness on the left side of his body.  An MRA was      subsequently performed which demonstrated infarct in the right      internal capsule and left white matter.  Neurology was consulted,      who recommended to maximize his risk factor management.  The      patient is already being treated with Plavix and aspirin for his      cardiac since.  He is treating his hyperlipidemia with Pravastatin,      he is treating his hypertension for agents,  and treating his      diabetes with two agents.  No other intervention was necessary, as      the acuity of the CVA could not be pinpointed.  The patient will      return to have a followup visit with [facility]      in approximately  2 months.  3. Diabetes.  Patient is treated at home with Lantus and glipizide.      He states he has recently been getting elevated blood glucoses.      The patient was continued on home doses and was found to need much      higher amounts of insulin and sliding scale than he had been at      home.  Did increase his glipizide and this did help his glucose      somewhat however, it was felt that patient may benefit from a twice      a day short and long-acting insulin such as a 70/30.  The patient      will followup with Dr. Sherlon Handing in the outpatient clinic to      further modify his diabetic regimen.  4. Hypertension.  The patient was on the maximum dose of Carvedilol      and Avalide upon admission to the  hospital.  These were continued      and his blood pressure was found to be as high as 200/100.      Therefore, Norvasc was added to his regimen which did improve his     blood pressures to around 140 to 150s systolic.  We will send home      on Norvasc, with further followup in outpatient clinic.  5. Anemia, patient was anemic but stable, with hemoglobins from 10.2,      drifting down to 9.8 during hospitalization.  The patient's      ferritin level was checked and found to be normal at 213.  It was      felt that his anemia is likely an anemia chronic disease.  The      patient is likely not bleeding from his GI tract as he recently had      a colonoscopy.  This should be followed up at his outpatient visit.  6. Difficulty swallowing.  The patient complained some difficulty with      swallowing pills.  Swallow study was performed, and it was felt      that this was secondary to esophagitis and GERD.  The patient will      continue with Ranitidine as an outpatient for his GERD.   DISCHARGE LABORATORIES:  CBC:  WBC 6.7, hemoglobin 9.8, hematocrit 30.8,  platelets 221.  BMET:  Sodium 138, potassium 2.9, chloride 108, bicarb  24, BUN 21, creatinine 1.1, glucose 148, calcium 8.5.  Blood  cultures  were negative x2 at 5 days.      Artist Beach, MD  Electronically Signed     SP/MEDQ  D:  03/12/2006  T:  03/13/2006  Job:  [id number]   cc:   Dr. Sherlon Handing, Outpatient Clinic  Pramod P. Pearlean Brownie, MD  Dr. Lyman Bishop, CVPS

## 2010-08-09 NOTE — Discharge Summary (Signed)
NAME:  [name] [name], [name] [name] NO.:  [id number]   MEDICAL RECORD NO.:  [id number]          PATIENT TYPE:  INP   LOCATION:  5002                         FACILITY:  [facility]   PHYSICIAN:  Nadara Mustard, MD     DATE OF BIRTH:  Jul 30, 1945   DATE OF ADMISSION:  01/24/2005  DATE OF DISCHARGE:  01/27/2005                                 DISCHARGE SUMMARY   DIAGNOSIS:  Ischemic necrosis, abscess, right below-the-knee amputation.   PROCEDURE:  Right above-the-knee amputation.   Discharged to home in stable condition.  Follow up in office in two weeks.  The patient was given a prescription for Tylox and Flexeril.   HISTORY OF PRESENT ILLNESS:  The patient is a 65 year old gentleman with an  ischemic, necrotic abscess of the right below-the-knee amputation.  The  patient has failed conservative care and presented at this time for surgical  intervention for revision of the amputation.   The patient's hospital course was essentially unremarkable.  He underwent a  revision amputation to a right above-the-knee amputation on January 24, 2005.  He received Kefzol for infection prophylaxis.  The tourniquet was not  used during surgery.  Postoperatively the patient was stable.  He started  with PCA morphine for pain control.  He was discharged to home in stable  condition on November 6 with follow-up in the office in two weeks.      Nadara Mustard, MD  Electronically Signed     MVD/MEDQ  D:  03/26/2005  T:  03/26/2005  Job:  [id number]

## 2010-08-09 NOTE — H&P (Signed)
[name] [name]. [facility]  Patient:    [name] [name], [name] [name]                   MRN: [id number] Adm. Date:  05/27/99 Attending:  Sandria Bales. Ezzard Standing, M.D. CC:         [name] [name], M.D.                         History and Physical  HISTORY OF ILLNESS:  [name] is a 65 year old black male, patient of Software engineer.  He developed buttocks pain on that Wednesday, May 22, 1999.  He was seen by Dr. Hal Hope, FNP on Friday, May 24, 1999; was placed on Augmentin, but had a lot of trouble with pain even sitting over the weekend.  He represented to the [facility] on Monday; was seen by Dr. Hal [name] in the afternoon who did an incision and drainage of the buttocks abscess.  There was concern about the size of the abscess and the patient was subsequently admitted to [facility] Emergency Room.  I was consulted for further management of this perirectal abscess.  The patient  denies any history of peptic ulcer disease, though he does have some liver disease, pancreatic disease or colon disease.  He has had no prior perirectal disease, though he says he had an abscess of his scrotum in the remote past.  PAST MEDICAL HISTORY:  ALLERGIES:  None.  CURRENT MEDICATIONS: 1. Glucophage 500 mg two tablets in the morning, one tablet at night. 2. Lotensin 20 mg b.i.d. 3. Amaryl 4 mg b.i.d.  REVIEW OF SYSTEMS:  NEUROLOGIC:  No seizure or ______ .  PULMONARY:  He quite smoking and drinking some 10 or 12 years ago.  CARDIAC:  No history of heart attack, though he does have hypertension.  GASTROINTESTINAL:  See history of present illness.  UROLOGIC:  No kidney stones or acute infections.  ENDOCRINE: He has been diabetic for four or five years, on oral hypoglycemics.  SOCIAL HISTORY:  The patient is accompanied by his wife.  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  PHYSICAL EXAMINATION:  VITAL SIGNS:   Temperature 100.1, pulse 102, blood pressure 144/83.  HEENT:  Unremarkable.  LUNGS:  Clear to auscultation.  ABDOMEN:  Soft.  I feel no tenderness, no guarding and no rebound.  RECTUM:  He has about a 3-4 cm hole and a necrotic abscess in his left buttocks.  I was able to put a finger into this.  He has multiple ______, but it appears that most of these are fairly well drained.  He has induration around this area.  I id not try to do a rectal examination on him.  LABS:  White blood count 13,900, hemoglobin 12.7.  Sodium 128, glucose 353.  IMPRESSION: 1. LEFT BUTTOCKS ABSCESS.  This may require further debridement.  At    this current time I plan to admit him.  He will be placed on IV    antibiotics, frequent dressing changes.  Will recheck his wound tomorrow. 2. DIABETES MELLITUS.  Partly uncontrolled because of infection.  I think    when the infection gets under control his diabetes will be under control. 3. HYPERTENSION.  Continue his current medications. DD:  05/27/99 TD:  05/27/99 Job: [id number]

## 2010-08-09 NOTE — Assessment & Plan Note (Signed)
[facility]                              CARDIOLOGY OFFICE NOTE   [name] [name], [name] [name]                      MRN:          [id number]  DATE:11/28/2005                            DOB:          1945/06/15    [name] [name] is a very pleasant gentleman who has a history of coronary  disease.  Since I last saw him, he is doing well with no dyspnea, chest  pain, palpitations, or syncope.  He occasionally has mild pedal edema in the  left lower extremity.   His medications include:  1. Lopressor 50 mg p.o. b.i.d.  2. Glipizide 5 mg p.o. daily.  3. Zocor 20 mg p.o. q.h.s.  4. Amitriptyline 25 mg p.o. every day.  5. Plavix 75 mg p.o. every day.  6. Aspirin 325 mg p.o. every day.  7. Avalide 300/12.5 mg tablets one p.o. every day.   PHYSICAL EXAMINATION:  VITAL SIGNS:  Show a blood pressure of 140/70 and his  pulse is 67.  He weighs 203 pounds.  NECK:  Supple.  CHEST:  Clear.  CARDIOVASCULAR:  Reveals a regular rate and rhythm.  EXTREMITIES:  Show trace edema on the left.  He is status post amputation on  the right.   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   DIAGNOSES:  1. Coronary artery disease.  2. Hypertension.  3. Hyperlipidemia.  4. Diabetes mellitus.  5. History of peripheral vascular disease.   PLAN:  [name] [name] is doing well from a symptomatic standpoint.  We will  continue with his present medications and his blood pressure appears to be  well controlled.  We discussed diet and exercise.  Note, he does not smoke.  He will return in 3 months for fasting lipids and liver.  Our goal LDL would be less than 70, given his history of coronary disease.  He will see [place] back in 9 months.                              Madolyn Frieze Jens Som, MD, [facility]   BSC/MedQ  DD:  11/28/2005 DT:  11/28/2005 Job #:  [id number]

## 2010-08-09 NOTE — Consult Note (Signed)
NAME:  [name] [name], FODOR NO.:  [id number]   MEDICAL RECORD NO.:  [id number]          PATIENT TYPE:  INP   LOCATION:  6740                         FACILITY:  [facility]   PHYSICIAN:  Pramod P. Pearlean Brownie, MD    DATE OF BIRTH:  07/23/1945   DATE OF CONSULTATION:  DATE OF DISCHARGE:                                 CONSULTATION   REASON FOR REQUEST:  Stroke.   HISTORY OF PRESENT ILLNESS:  Mr. Solivan is a 65 year old African  American gentleman was admitted for non healing ulcer of the left ankle.  He was found to have transient dysarthria and hence MRI scan was  obtained which has shown small acute infarct in the left frontal  subcortical white matter as well as right posterior __________ capsule  prompting this consult.  The patient denies any previous known history  of stroke or TIAs __________ with MRI has shown a couple of vasoganglion  infarcts on both sides.  He states he is having some dysarthria now, but  in the past he says that this probably has been going on for some time.  He denies any significant focal extremity weakness, numbness.  There is  no prior history of seizures or significant neurological problems.   PAST MEDICAL HISTORY:  1. Diabetes which is uncontrolled.  2. Hypertension.  3. Coronary artery disease.  4. Gastroesophageal reflux disease.  5. Anemia.   PAST SURGICAL HISTORY:  1. Right leg below knee amputation.   HOME MEDICATIONS:  1. Coreg.  2. Terazosin.  3. Glipizide.  4. Avapro.  5. Hydrochlorothiazide.  6. Zocor.  7. Elavil.  8. Protonix.  9. Aspirin.  10.Plavix.  11.Norvasc.  12.Insulin.  13.Vancomycin.  14.Zosyn.   ALLERGIES TO MEDICATIONS:  None known.   SOCIAL HISTORY:  The patient is a former smoker having smoked one pack  per day, who years ago.  He quit alcohol 15 years ago.  He lives at home  with his wife.  I independent with activities of daily living.   FAMILY HISTORY:  Noncontributory.   REVIEW OF SYSTEMS:   Positive for speech difficulties.  Gait  difficulties.   PHYSICAL EXAMINATION:  GENERAL:  Obese, middle-aged African American  gentleman.  VITAL SIGNS:  Afebrile , heart rate 76 per minute, regular sinus,  respiratory rate 20 per minute, blood pressure 150/90, saturations 98%  on room air.  EXTREMITIES:  He has right above-knee amputation.  There is 1+ pedal  edema in the lower extremity with a bandage over the ankles.  Distal  pulse cannot be felt.  HEENT:  Head nontraumatic.  NECK:  Supple without bruit.  ENT exam unremarkable.  CARDIAC:  regular heart sounds.  LUNGS:  Clear to auscultation.  NEUROLOGICAL:  the patient is awake, alert, cooperative with no aphasia.  There is only minimum dysarthria and he slurs only a few words.  Eye  movements are full range without nystagmus.  He follows commands well.  Face is slightly asymmetric, particularly when he smiles.  Left lower  face is weak.  Palatal movements normal.  Tongue is midline.  Motor  system exam  no upper extremity drift, symmetric strength, tone, reflexes  coordination, sensation.  Right lower extremity exam was limited due to  amputation.  Finger-to-nose coordination is slow but accurate.  Gait was  not tested.   DATA REVIEWED:  MRI scan of the brain done yesterday revealed two tiny  diffusion positive lesions in the left frontal periventricular white  matter and right posterior internal capsule consistent with acute  infarcts.  Old lacunar infarcts are noted in both basal ganglia.  MRA  shows mild atheromatous changes in both the cavernous carotid arteries.  Hemoglobin A1c is elevated at 9.6.  Lipid profile is pending   IMPRESSION:  A 65-year gentleman with intermittent dysarthria secondary  to small bicerebral subcortical white matter infarct secondary to small  vessel disease due to uncontrolled diabetes, hypertension.  He has  previous history of silent basal ganglia infarcts as well.  Multiple  vascular risk  factors of hypertension, diabetes, peripheral vascular  disease __________ .   PLAN:  Agree with continuing antiplatelet therapy as well as aggressive  risk factor modification with strict control of diabetes with hemoglobin  A1c goal below 7 and hypertension systolic blood pressure goal below  130.  Check fasting homocysteine, a lipid profile, carotid ultrasound,  transcranial Doppler studies.  Will be happy to follow the patient.  Kindly call for questions.  I had a long discussion with the patient and  his wife and answered questions.           ______________________________  Sunny Schlein. Pearlean Brownie, MD     PPS/MEDQ  D:  03/11/2006  T:  03/12/2006  Job:  [id number]

## 2010-08-09 NOTE — Consult Note (Signed)
NAME:  [name] [name], [name] [name] NO.:  [id number]   MEDICAL RECORD NO.:  [id number]          PATIENT TYPE:  REC   LOCATION:  FOOT                         FACILITY:  [facility]   PHYSICIAN:  Theresia Majors. Tanda Rockers, M.D.DATE OF BIRTH:  04-13-1945   DATE OF CONSULTATION:  DATE OF DISCHARGE:                                 CONSULTATION   CONTINUATION OF CONSULTATION.   Dictation was interrupted.   PHYSICAL EXAMINATION:  VITAL SIGNS:  Blood pressure is 121/65,  respirations 18, pulse rate 72, temperature 98.3.  Capillary blood  glucose is 165 mg%.  HEENT:  Clear.  The neck is supple.  Trachea is midline.  Thyroid is  nonpalpable.  LUNGS:  The lungs are clear.  HEART:  The heart sounds are distant.  ABDOMEN:  The abdomen is soft.  EXTREMITIES:  The extremities exam is remarkable for a well fitting  above the knee prosthesis on the right lower extremity.  On the left  lower extremity there is 3+ edema with shiny skin on the anterior tibia  extending to the pedal level.  There is a full thickness ulceration  involving the heel, extending through the adipose tissue down to the  periosteal level.  There is minimum information. The patient is  insensate.  At the lateral 5th met head, there is a full thickness  ulceration which required debridement down to the subcutaneous tissue  and juxta opposed to the fascia.  The heel wound underwent a full  thickness debridement utilizing a 10 blade.  NEUROLOGICAL:  Neurologically, the patient is insensate. There is no  evidence of active infection.   DISCUSSION:  [name] has an advanced Wagner, grade 3, diabetic foot  ulcer involving the heel and a lesser advanced Wagner, grade 3 also  involving the 5th met head.  We will continue to offload him, utilizing  padded dressings and a PRAFO boot. We will retrieve his data base to  ascertain the concurrence of occlusive vascular disease which I  anticipate is present.  We will continue to collect his  data base as we  consider him as a candidate for hyperbaric oxygen treatment.  We have  explained this approach to the patient and his wife in terms that they  seem to understand. We will re-evaluate him in 2 days to reassess his  wound and to determine the need for further debridement and/or the  addition of antibiotic based on our culture reports.   We have given the patient and his wife an opportunity to ask questions.  They seem to be satisfied with this approach and indicate they will be  compliant as per above.      Harold A. Tanda Rockers, M.D.  Electronically Signed     HAN/MEDQ  D:  07/14/2006  T:  07/14/2006  Job:  [id number]   cc:   Jon Gills, M.D.

## 2010-08-09 NOTE — Assessment & Plan Note (Signed)
[facility]   NAME:  KENDELL, SAGRAVES NO.:  [id number]   MEDICAL RECORD NO.:  [id number]      DATE OF BIRTH:  12/12/1945   PHYSICIAN:  Theresia Majors. Tanda Rockers, M.D. VISIT DATE:  07/22/2006                                   OFFICE VISIT   SUBJECTIVE:  [name] [name] is a 65 year old man with a Wagner grade III  diabetic foot ulcer involving his left foot.  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  The patient has been recently  evaluated by Dr. 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  We have offered  the patient hyperbaric oxygen treatment in an attempt to salvage the  left lower extremity.  In the interim, the patient has been utilizing a  PRAFOR boot to avoid pressure while at rest.  He continues to offload  utilizing a motorized wheelchair within the home.  He denies interim  fever, excessive drainage or malodor.   OBJECTIVE:  Capillary blood glucose is 221 mg%, temperature is 98.4.  The wound #1 on the lateral 5th metatarsal head has a firm eschar.  The  heel wound extends down the periosteum of the calcaneus.  There is a  moderate amount of well-adherent necrotic tissue, which was not  debrided.  We reviewed his laboratory and pre-op in preparation for HBO.  There are no acute changes on his recent chest film.  His CBC shows a  chronic anemia, as electrolytes are within normal limits.   ASSESSMENT:  Wagner grade III diabetic foot ulcer.   PLAN:  We are proceeding with hyperbaric oxygen to administered at 2  atmospheres, 100% oxygen for 90 minutes.   We have reviewed the indications, the potential complications including  baric trauma, claustrophobia, and oxygen toxicity with the patient and  his wife in terms that they seem to understand.  They have toured the  hyperbaric chambers, and have been introduced to the precautions that  will be implemented upon  diving.  They have been given an opportunity to  ask questions.  They seem to understand and expressed a desire to  proceed with the therapy as outlined.  We will proceed with HBO therapy  as son as the current schedule can accommodate.  In the interim, we will  continue to evaluate the wound every 5 days and perform a debridement as  necessary.  Offloading will be continued by utilization of the [facility]  boot and the motorized wheelchair.      Harold A. Tanda Rockers, M.D.  Electronically Signed     HAN/MEDQ  D:  07/22/2006  T:  07/22/2006  Job:  [id number]

## 2010-08-09 NOTE — Discharge Summary (Signed)
NAME:  [name] [name], [name] [name] NO.:  [id number]   MEDICAL RECORD NO.:  [id number]          PATIENT TYPE:  INP   LOCATION:  6527                         FACILITY:  [facility]   PHYSICIAN:  Adonis Housekeeper, M.D.      DATE OF BIRTH:  10-19-1945   DATE OF ADMISSION:  04/23/2004  DATE OF DISCHARGE:  04/26/2004                                 DISCHARGE SUMMARY   DISCHARGE DIAGNOSES:  1.  Coronary artery disease.  The patient initially presented with chest      pain and was discovered to have a new right bundle [name] [name] block on      electrocardiogram.  Had a consult from cardiology who did a      catheterization and found two-vessel coronary artery disease with      critical disease in the right coronary artery.  Received two stents and      high-speed rotational arthrectomy and temporary pacemaker placement.  2.  Hypertension.  The patient does have long-standing hypertension.  3.  Diabetes mellitus.  Last hemoglobin A1C 11.4 done here during admission.  4.  Toe ulcer.  The patient has history of chronic ulcers.  5.  Hypertriglyceridemia with triglycerides into the 300s.   DISCHARGE MEDICATIONS:  1.  Aspirin 325 mg q.d.  2.  Lisinopril 20 mg q.d.  3.  Hydrochlorothiazide 12.5 mg q.d.  4.  Actos 15 mg q.d.  5.  Glipizide 5 mg q.d.  6.  Amitriptyline 50 mg every night.  7.  Plavix 75 mg daily.  8.  Zocor 20 mg daily.  9.  Metoprolol 25 mg twice a day.  10. Lantus 10 units subcu q.h.s.   PROCEDURES:  1.  Cardiac catheterization on April 24, 2004, with left heart with      coronary angiography and left ventriculography, percutaneous coronary      intervention with high-speed rotational arthrectomy followed by      placement of two bare metal stents in the right coronary artery and      right temporary pacemaker placement in the right ventricle.  2.  Echocardiography on February 1, showing grade 1, left ventricular      diastolic function with mild increase of left ventricular  thickness.      Ejection fraction 55-60% and a left atrial size in the upper limits of      normal.   CONSULTATIONS:  Carole Binning, M.D., [facility].   CHIEF COMPLAINT:  Chest tightness.   HISTORY OF PRESENT ILLNESS:  Mr. Silvers is a 65 year old male with a past  medical history of diabetes, hypertension and questionable hyperlipidemia  who presents with a 2 week history of intermittent chest tightness and pain  in the left chest that is not associated with nausea, vomiting, diaphoresis  or shortness of breath.  This pain happens at rest and is intermittent.  He  describes the pain as a squeezing muscular sensation with radiation to the  left scapula.  He has a history of a remote cardiac catheterization in 2000,  by Dr. Jens Som and the patient states that he had some  blockages.   PAST MEDICAL HISTORY:  1.  Hypertension.  2.  Diabetes.  3.  History of right lower extremity cellulitis.  4.  Questionable history of abnormal catheterization.   ALLERGIES:  No known drug allergies.   MEDICATIONS:  1.  Aspirin 325 mg daily.  2.  Lisinopril 10 mg daily.  3.  Actos 15 mg daily.  4.  Hydrochlorothiazide 12.5 mg daily.   SOCIAL HISTORY:  Mr. Sinning is married.  He is fully independent at home.  He is a former smoker who quit 20 years ago.  He quit drinking alcohol 20  years ago.  No cocaine or IV drug use.   FAMILY HISTORY:  Notable for a mother who is healthy and living at 26 years  old.  His father passed away in his 20s of diabetes and heart disease.  He  has siblings with diabetes and heart disease.  He has four children who are  healthy and one died of questionable pneumonia.   REVIEW OF SYSTEMS:  CONSTITUTIONAL:  Weight gain.  CARDIOPULMONARY:  Chest  pain and orthopnea.  All other review of systems negative.   PHYSICAL EXAMINATION:  VITAL SIGNS:  Pulse 107, temperature 96, respirations  20, blood pressure 154/88.  O2 saturations 98% on room air.   GENERAL:  In no acute distress.  Alert and oriented x3.  HEENT:  Pupils equal round and reactive to light.  Oropharynx was clear.  NECK:  Soft, supple with no thyromegaly.  LUNGS:  Clear to auscultation bilaterally.  CARDIAC:  Regular rate and rhythm without murmurs, rubs or gallops.  ABDOMEN:  Soft, nontender, nondistended, positive bowel sounds.  No  hepatosplenomegaly.  EXTREMITIES:  No edema.  His right big toe in a bandage, clear, dry and  intact.  This was secondary to an ulcer.  SKIN:  Folliculitis on chest.  No lymphadenopathy.  NEUROLOGIC:  Grossly intact.  MUSCULOSKELETAL:  Grossly intact.   LABORATORY DATA AND X-RAY FINDINGS:  Sodium 136, potassium 4.0, chloride  102, bicarb 27, BUN 28, creatinine 1.1, glucose 199.  Alk phos 120, AST 26,  ALT 25, total protein 7.7, albumin 3.4, calcium 9.2.  White blood count 8,  hemoglobin 12.9, platelets 224, MCV 73.5, RDW 13.   EKG normal sinus rhythm with left inferior fascicular block, new right  bundle [name] [name] block.   HOSPITAL COURSE:  Problem 1.  CHEST PAIN:  The patient had EKG showing new  right bundle [name] [name] block.  Given history of possible coronary artery  disease, cardiology consult was called.  Mr. Commins received a cardiac  catheterization on February 1.  Please see dictated catheterization report.  Two bare metal stents were placed in the right coronary artery preceded by a  high-speed arthrectomy.  The patient was then placed on 18 hour Integrillin  IV and then switched over the p.o. Plavix for which he will continue for 9  months to 1 year.  Following his catheterization, he received an  echocardiogram.  He had continued intermittent chest pain with wheezing  nature.  He had increased cardiac enzymes from an initial presentation of CK-  MB of 2.1 and troponin of 0.01 to a CK-MB of 16.2, troponin of 1.48.  On February 3, his cardiac enzymes were CK-MB of 8.7 and troponin of 2.55.  This was thought to be secondary to the  prolonged cardiac catheterization  and the patient was watched for another 24 hours.  On day of discharge, the  patient's symptoms were markedly less  and he is to follow up with cardiology  in 1 month.   Problem 2.  DIABETES MELLITUS:  The patient has, by clinic charts, a  generally increasing hemoglobin A1C that was 10 in November 2005, to 11.4 on  this visit.  The patient was placed on Lantus 10 units subcu each night in  addition to his Glipizide and Actos which decreased his glucoses in the 110s  to 130s.  The patient is to continue this after discharge.   Problem 3.  HYPERTENSION:  The patient had long-standing hypertension.  He  was placed on metoprolol 25 mg b.i.d. in addition to his HCTZ and lisinopril  and blood pressures improved.   Problem 4.  HYPERLIPIDEMIA:  The patient was started on a Statin following  his cardiac catheterization.  Of note, the patient was given an influenza  vaccine upon admission to the hospital.   DISCHARGE LABORATORY DATA AND X-RAY FINDINGS:  On day of discharge, cardiac  enzymes with CK 127, CK-MB 8.7, troponin I 2.55.  Sodium 135, potassium 4.1,  chloride 104, bicarb 25, BUN 26, creatinine 1.1, glucose 143.  White cell  count 8.1, hemoglobin 10.4, platelets 169.   FOLLOW UP:  The patient will follow up in [facility] with  acting intern, Garen Lah, and Adonis Housekeeper, M.D. on February 13, at 2:30  p.m.  He is also to follow up with his primary care physician there, Dr.  Lorn Junes, on April 3, at 1:30 p.m.  He is to follow up with [facility] on March 15, at 1:45 p.m.      TS/MEDQ  D:  04/26/2004  T:  04/26/2004  Job:  [id number]   cc:   [facility]   [facility]   Carole Binning, M.D. [facility]

## 2010-08-09 NOTE — Assessment & Plan Note (Signed)
[facility]   NAME:  [name] [name], [name] [name] NO.:  [id number]   MEDICAL RECORD NO.:  [id number]            DATE OF BIRTH:   PHYSICIAN:  Maxwell Caul, M.D. VISIT DATE:  07/17/2006                                   OFFICE VISIT   PURPOSE OF TODAY'S VISIT:  Followup of his initial visit on April 22,  for probably 2 Wagner grade 3 wounds, one over the left fifth metatarsal  head and the other on the left heel.  This is a gentleman who previously  has had an amputation on the right done by Dr. Lajoyce Corners.  A culture was done  of the left heel.  At this point, it is still preliminary in reporting.  The patient denies any fevers, systemic symptoms.  He was treated last  time with Accuzyme to the metatarsal head and AcuSeal with silver to the  heal, as well as a Profore.  He is being prepped for HBO.   WOUND EXAM:  Temperature 98.0.  Pulse 68.  Respirations 20.  BP 139/76.  Blood glucose was 150.  To the fifth metatarsal head, this underwent a  full thickness debridement with a number 15 blade.  We used EMLA for  anesthesia and then direct pressure for hemostasis.  This would was  cleaned up quite nicely and there is granulation present.  The heel  wound had a thick adherent black eschar that underwent a difficult full  thickness debridement with a number 10 blade with EMLA for anesthesia  and direct pressure for hemostasis.  The left heel still has a large  amount of necrotic eschar that I could not remove in 1 setting.  There  was no evidence of surrounding infection in either location.   MANAGEMENT PLAN & GOALS:  He is due to have a T-Comm later today.  I  changed his dressing to Accuzyme to the heel and the AcuSeal to the left  fifth metatarsal head, which appears to be, at this point, the healthier  of the 2 wounds.  He will continue with a Profore offloading and be seen  early next week on Monday or Tuesday for continued evaluation for HBO.  Vascular  studies were requested last time.  I believe he has been seen  by Dr. Hart Rochester.  I do not see these results at this point.           ______________________________  Maxwell Caul, M.D.     MGR/MEDQ  D:  07/17/2006  T:  07/18/2006  Job:  [id number]

## 2010-08-09 NOTE — Discharge Summary (Signed)
NAME:  [name] [name], [name] [name] NO.:  [id number]   MEDICAL RECORD NO.:  [id number]                   PATIENT TYPE:  INP   LOCATION:  5734                                 FACILITY:  [facility]   PHYSICIAN:  Ollen Gross. Vernell Morgans, M.D.              DATE OF BIRTH:  30-Sep-1945   DATE OF ADMISSION:  08/01/2003  DATE OF DISCHARGE:  08/08/2003                                 DISCHARGE SUMMARY   HISTORY:  [name] is a 65 year old black male who was initially admitted  with a necrotic perirectal abscess.  He was taken to the operating room for  debridement of this area.  Postoperatively, he was managed with dressing  changes and sliding scale insulin for his diabetes, as well as some pulse  lavage therapy.  He tolerated all of this well, and his wound continued to  clean up.  On May 17, he was ready for discharge home.   DISCHARGE MEDICATIONS:  He was to resume his home medications.   DISCHARGE CONDITION:  Stable.   FINAL DIAGNOSIS:  Perirectal abscess.   DISCHARGE DIET:  As tolerated.   FOLLOWUP:  With Dr. Carolynne Edouard in a week to check his wound, and he is discharged  home.                                                Ollen Gross. Vernell Morgans, M.D.    PST/MEDQ  D:  09/13/2003  T:  09/15/2003  Job:  [id number]

## 2010-08-09 NOTE — Op Note (Signed)
NAME:  [name] [name], [name] [name] NO.:  [id number]   MEDICAL RECORD NO.:  [id number]          PATIENT TYPE:  OIB   LOCATION:  2858                         FACILITY:  [facility]   PHYSICIAN:  Quita Skye. Hart Rochester, M.D.  DATE OF BIRTH:  06-22-1945   DATE OF PROCEDURE:  12/21/2003  DATE OF DISCHARGE:  12/21/2003                                 OPERATIVE REPORT   PREOPERATIVE DIAGNOSIS:  Nonhealing ulcer, right foot, secondary to tibial  occlusive disease.   PROCEDURE:  Abdominal aortogram with bilateral lower extremity runoff via  right common femoral approach.   SURGEON:  Quita Skye. Hart Rochester, M.D.   ANESTHESIA:  Local Xylocaine.   CONTRAST:  145 mL.   COMPLICATIONS:  None.   DESCRIPTION OF PROCEDURE:  The patient was taken to the [facility]  peripheral endovascular lab, placed in the supine position, at which time  both groins were prepped with Betadine scrub and solution, draped in the  routine sterile manner.  After infiltration with 1% Xylocaine, the right  common femoral artery was entered percutaneously, a guidewire passed into  the suprarenal aorta under fluoroscopic guidance.  A 5 French sheath and  dilator were passed over the guidewire, the dilator removed, and a standard  pigtail catheter positioned in the suprarenal aorta.  Flush abdominal  aortogram was performed, injecting 20 mL of contrast at 20 mL/sec.  This  revealed the aorta to be widely patent with single renal arteries  bilaterally, which appeared normal.  The inferior mesenteric artery was  patent.  Both common, internal, and external iliac arteries were smooth,  widely patent, and relatively free of disease.  The catheter was withdrawn  into the terminal aorta and bilateral lower extremity runoff performed,  injecting 88 mL of contrast at 8 mL/sec.  This revealed both iliac systems  to be normal in appearance.  The right superficial femoral profunda femoris,  and popliteal arteries were widely patent with mild  irregularity in the  adductor canal, with no significant stenosis.  There was minimal narrowing  of the popliteal artery above the knee, less than 30%.  The popliteal artery  was widely patent across the knee joint, and there was three-vessel runoff  on the right proximally with diseased vessels but patent posterior tibial,  anterior tibial, and peroneal.  The vessels all became more severely  diseased as the leg progressed more distally with near-total occlusion of  all the vessels near the ankle.  The peroneal artery was severely diseased  in the mid-calf.  Posterior tibial was the best vessel on the left, although  it had a significant stenosis in the tibioperoneal trunk region and then  became small distally.  On the left side all three tibial vessels were also  severely diseased but were slightly better distally.  To get better  definition, a peek-hold technique was utilized on the right side through the  sheath as the catheter was withdrawn over the guidewire.  This again  confirmed the findings that the anterior tibial artery was patent proximally  down to the middle third of the leg, where it became quite small  and  diseased distally.  The posterior tibial and peroneal arteries were also  quite diseased distally.  A lateral view of the foot revealed no  reconstitution of any normal-appearing vessels on the dorsum of the foot  that would be suitable for bypass grafting.  Having tolerated the procedure  well, the sheath was removed, adequate compression applied, no complications  ensued.   FINDINGS:  1.  Relatively normal aortoiliac, superficial femoral, and popliteal      arteries bilaterally.  2.  Severe bilateral tibial disease, right worse than left, with involvement      of all three tibial vessels in both legs.       JDL/MEDQ  D:  12/21/2003  T:  12/22/2003  Job:  [id number]

## 2010-08-09 NOTE — Consult Note (Signed)
NAME:  [name] [name], PARADISO NO.:  [id number]   MEDICAL RECORD NO.:  [id number]          PATIENT TYPE:  INP   LOCATION:  3707                         FACILITY:  [facility]   PHYSICIAN:  Charlton Haws, M.D.     DATE OF BIRTH:  February 06, 1946   DATE OF CONSULTATION:  04/23/2004  DATE OF DISCHARGE:                                   CONSULTATION   [name] [name] is a 65 year old diabetic admitted by [facility] today, I  believe Dr. Darrick Huntsman. He was admitted for chest pain. He apparently has seen  Dr. Jens Som in the past and had a heart catheterization without critical  disease more than four to five years ago. He has been having substernal  chest pain with exertion that radiates to both arms and some times to the  back. It has been going on for two weeks.   He has not had any significant diaphoresis or shortness of breath.   The patient does have significant diabetes. He has had a couple of buttock  abscesses requiring I&D in the last year and a half. He has peripheral  vascular disease, however this does not include the larger vessels in the  iliac. He basically has trifurcation disease in both lower extremities,  which was not amenable to surgical intervention. He had had an angio by Dr.  Hart Rochester last year.   He does have some claudication and has had a nonhealing ulcer in the right  leg with cellulitis before.   His review of systems is otherwise benign.   He used to smoke and drink, but does not any more. He is normally seen at  the clinic here. He does not work.   The patient also has hypertension for which he takes lisinopril 20 mg daily  and hydrochlorothiazide 12.5 mg daily. For his diabetes he used to be  Glucophage, but now he is on Actos 15 mg daily and glipizide 5 mg daily.   He has no known allergies.   PHYSICAL EXAMINATION:  VITAL SIGNS: Blood pressure 140/80, pulse 80 and  regular.  HEENT: Fundi clear.  NECK: Carotids normal.  HEART: No abnormal heart  sounds.  ABDOMEN: Benign.  EXTREMITIES: Femoral arteries are +2 bilaterally. I cannot hear a bruit. His  DT and PT are decreased bilaterally. He has previous scars from his buttocks  surgery.   EKG shows sinus rhythm, right bundle branch block. There are no acute  changes.   His labs and chest x-ray are pending.   IMPRESSION:  It would be important to check his chest x-ray to make sure his  mediastinum is normal given his diabetes and hypertension. He clearly needs  a heart catheterization for his chest pain. Risks were discussed. He is  willing to proceed. We will have to check his creatinine and make sure this  is okay. We will hold his oral hypoglycemics in the morning and cover him  with sliding scale insulin.   Further recommendations will be based on these results.   I think the patient has had some chronic pain and discomfort in the ischial  site by  Dr. Hart Rochester done previously. Will prep both groins and recommend  using the left side since he has discomfort in the right groin.      PN/MEDQ  D:  04/23/2004  T:  04/23/2004  Job:  [id number]

## 2010-08-09 NOTE — Discharge Summary (Signed)
[facility]  Patient:    [name] [name], [name] [name]                      MRN: [id number] Adm. Date:  05/27/99 Disc. Date: 06/02/99 Attending:  Sandria Bales. Ezzard [name], M.D. CC:         [name] [name]. [name] [name], M.D.             [facility]                           Discharge Summary  DATE OF BIRTH: April 09, 1945  DISCHARGE DIAGNOSES:  1. Necrotizing fasciitis/left buttocks abscess.  2. Adult onset diabetes mellitus.  3. Hypertension.  OPERATION/PROCEDURE: Incision and drainage and debridement of left buttock wound on May 29, 1999.  HISTORY OF PRESENT ILLNESS: [name] is a 65 year old black male who is a patient at [facility].  He developed pain in his left buttock on May 22, 1999 and was seen by [name] [name], a family nurse practitioner, on Friday, May 24, 1999, and was placed on Augmentin.  However, he developed increasing left buttock pain and had an incision and drainage at [facility], and was sent to the emergency room for ER evaluation.  I was asked to see him upon arrival to the emergency room.  PAST MEDICAL HISTORY:  1. He is an adult diabetic and also diabetic for four years, on Glucophage     and Amaryl for diabetes. 2.  He had been hypertensive, on Lotensin.  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  PHYSICAL EXAMINATION: On physical examination his temperature was 100.1 degrees, pulse 102.  He had a large necrotic abscess which was open and loculated on his left buttock.  I debrided this some in the emergency room with my finger and hoped that it had been adequately opened.  HOSPITAL COURSE: The patient was placed in the hospital on IV Unasyn.  His temperature improved to 98.7 degrees and his WBC was 9000 the following morning.  He did have left cellulitis; however, I was concerned how adequately this had been debrided and therefore scheduled him for debridement in the [facility] Emergency Room.  He was taken to the operating room on May 29, 1999 and underwent incision and drainage and debridement of the left buttock abscess and necrotizing fasciitis.  We then started wound care on the Floor, continuing his Unasyn.  He also had poorly controlled blood sugar while in the hospital, ranging as high as the 300 range, and he was placed on sliding-scale; however, as his infection improved so did his diabetes.  By the fourth postoperative day, sixth hospital day, his infection appeared to be under good enough control to send him home.  We arranged for home health care nursing to follow him.  He also will see [facility] for follow-up of his diabetes.  DISCHARGE MEDICATIONS:  1. Augmentin p.o.  2. Vicodin for pain.  FOLLOW-UP: He is to return to see me in five to seven days for follow-up and check of his wound.  He is to call for any further problem.DD:  06/26/99 TD:  06/27/99 Job: [id number]

## 2010-08-16 ENCOUNTER — Encounter: Payer: Self-pay | Admitting: Internal Medicine

## 2010-08-16 ENCOUNTER — Ambulatory Visit (INDEPENDENT_AMBULATORY_CARE_PROVIDER_SITE_OTHER): Payer: Medicare Other | Admitting: Internal Medicine

## 2010-08-16 VITALS — BP 144/76 | HR 70 | Temp 99.3°F | Ht 68.0 in | Wt 220.0 lb

## 2010-08-16 DIAGNOSIS — K047 Periapical abscess without sinus: Secondary | ICD-10-CM | POA: Insufficient documentation

## 2010-08-16 DIAGNOSIS — K029 Dental caries, unspecified: Secondary | ICD-10-CM

## 2010-08-16 DIAGNOSIS — I10 Essential (primary) hypertension: Secondary | ICD-10-CM

## 2010-08-16 DIAGNOSIS — N400 Enlarged prostate without lower urinary tract symptoms: Secondary | ICD-10-CM

## 2010-08-16 DIAGNOSIS — E875 Hyperkalemia: Secondary | ICD-10-CM

## 2010-08-16 DIAGNOSIS — E119 Type 2 diabetes mellitus without complications: Secondary | ICD-10-CM

## 2010-08-16 LAB — BASIC METABOLIC PANEL
Calcium: 8.8 mg/dL (ref 8.4–10.5)
Glucose, Bld: 74 mg/dL (ref 70–99)
Sodium: 139 mEq/L (ref 135–145)

## 2010-08-16 LAB — GLUCOSE, CAPILLARY: Glucose-Capillary: 79 mg/dL (ref 70–99)

## 2010-08-16 MED ORDER — CLINDAMYCIN HCL 300 MG PO CAPS: 300.0000 mg | ORAL_CAPSULE | Freq: Four times a day (QID) | ORAL | Status: AC

## 2010-08-16 MED ORDER — HYDROCODONE-ACETAMINOPHEN 7.5-500 MG PO TABS: 1.0000 | ORAL_TABLET | ORAL | Status: AC | PRN

## 2010-08-16 NOTE — Progress Notes (Signed)
  Subjective:    Patient ID: [name] [name], male    DOB: Jan 22, 1946, 65 y.o.   MRN: [id number]  HPI  [name] [name] is a very pleasant 65 year old, and who presents today for a routine followup visit.     Pt reports recent dental infection in 06/2010.  He completed a course of amoxicillin with improvement of his pain and facial swelling.   He reports increased pain for the past 3 days prior to his OV.  He denies fevers, rigors, nausea/vomiting, inability to eat, or facial swelling.  He describes the pain as 5/10 pain in his upper left jaw.  The pain worsens with movement and eating; there are no alleviating.  He reports taking Lasix as needed for LE swelling.    His CBGs have been averaging between 130-250.  There are no hypoglycemic events on his CBGs log and he denies any hypoglycemic events.  He denies chest pain, sob, increased LE swelling, syncope, or weakness/numbess/tingling.   Review of Systems 12 point review of systems completed. Pertinent positives outlined and discussed in history of present illness; all other systems reviewed and negative.     Objective:   Physical Exam    VItal signs reviewed and stable. GEN: No apparent distress.  Alert and oriented x 3.  Pleasant, conversant, and cooperative to exam. HEENT: head is autraumatic and normocephalic.  Neck is supple without palpable masses or lymphadenopathy.  No JVD or carotid bruits.  Vision intact.  EOMI.  PERRLA.  Sclerae anicteric.  Conjunctivae without pallor or injection. Mucous membranes are moist.  Oropharynx is without erythema, exudates, or other abnormal lesions.  Dentition is poor with numerous teeth missing.  No obvious draining lesions.  There is an area on his left upper palate that appears slightly erythematous with evidence of severe dental caries it is tender to palpation. No purulent drainage. No facial erythema or swelling. RESP:  Lungs are clear to ascultation bilaterally with good air movement.  No wheezes,  ronchi, or rubs. CARDIOVASCULAR: regular rate, normal rhythm.  Clear S1, S2, no murmurs, gallops, or rubs. ABDOMEN: soft, non-tender, non-distended.  Bowels sounds present in all quadrants and normoactive.  No palpable masses. EXT: warm and dry.  Peripheral pulses equal, intact, and +2 globally.  No clubbing or cyanosis. R AKA noted.  +2 pittigd edema in LE SKIN: warm and dry with normal turgor.  No rashes or abnormal lesions observed. NEURO: CN II-XII grossly intact.  Muscle strength +5/5 in bilateral upper and lower extremities.  Sensation is grossly intact.  No focal deficit.     Assessment & Plan:

## 2010-08-16 NOTE — Assessment & Plan Note (Signed)
[name] [name] is doing a great job managing his diabetes. He is not experiencing any hypoglycemic episodes or persistent hyperglycemia. He will need referral for diabetic eye exam at his next appointment. He is also due for his annual foot exam as well as a microalbumin creatinine ratio. I will plan to check this at his next office visit.

## 2010-08-16 NOTE — Assessment & Plan Note (Signed)
[name] is experiencing increased pain in his upper left palate. This is occurring at the site of a significant dental cavity. He is afebrile and hemodynamically stable; I do not believe he is bacteremic systemically ill as a result of his dental infection. There is some pain and erythema at the site of this cavity I am concerned it may be developing into a dental abscess. I will prescribe a seven-day course of clindamycin for empiric treatment refer him to a dentist today. We'll also prescribe hydrocodone to be used as needed for pain control. [name] is advised to return to the clinic or to go to the emergency room if he experiences fever, rigors, worsening dental pain, facial swelling or erythema, or other concerning symptoms.

## 2010-08-16 NOTE — Patient Instructions (Addendum)
Schedule a followup appointment with Dr. Arvilla Market in 2-3 months. Clindamycin is an antibiotic to treat your tooth infection. Use as directed and be sure to complete the full course of antibiotics. Vicodin is a pain medication. Use as directed. Avoid taking any additional Tylenol while you take Vicodin. It is okay to take ibuprofen, ADVIL, Motrin, or other NSAID medications while you take Vicodin. If you develop any fevers, shaking chills, worsening tooth pain, facial swelling or redness, or any other concerning symptoms call the clinic to schedule an appointment or go to the emergency room. Try over the counter dulcolcax to help with constipation.  It is a stool softener.

## 2010-08-16 NOTE — Assessment & Plan Note (Signed)
In March of this year [name] [name] experienced asymptomatic hyperkalemia with a potassium of 5.4.  At this time he was encouraged to continue with his daily Lasix and continued on his ACE inhibitor. A repeat bmet  revealed a potassium of 4.8.  He is currently asymptomatic. Will repeat a basic metabolic panel today to assess his electrolytes status and renal function.

## 2010-08-16 NOTE — Assessment & Plan Note (Signed)
[name] [name] blood pressure is very well-controlled with lisinopril.  I will check a basic metabolic panel today to assess his electrolyte status and renal function.

## 2010-08-17 LAB — CBC WITH DIFFERENTIAL/PLATELET
Hemoglobin: 11 g/dL — ABNORMAL LOW (ref 13.0–17.0)
Lymphocytes Relative: 32 % (ref 12–46)
Lymphs Abs: 2.5 10*3/uL (ref 0.7–4.0)
MCV: 73.3 fL — ABNORMAL LOW (ref 78.0–100.0)
Monocytes Relative: 9 % (ref 3–12)
Neutrophils Relative %: 56 % (ref 43–77)
Platelets: 144 10*3/uL — ABNORMAL LOW (ref 150–400)
RBC: 4.54 MIL/uL (ref 4.22–5.81)
WBC: 7.8 10*3/uL (ref 4.0–10.5)

## 2010-09-06 ENCOUNTER — Telehealth (INDEPENDENT_AMBULATORY_CARE_PROVIDER_SITE_OTHER): Payer: Medicare Other | Admitting: *Deleted

## 2010-09-06 DIAGNOSIS — E119 Type 2 diabetes mellitus without complications: Secondary | ICD-10-CM

## 2010-09-06 MED ORDER — INSULIN PEN NEEDLE 30G X 8 MM MISC: 2.0000 | Status: DC | PRN

## 2010-09-10 ENCOUNTER — Other Ambulatory Visit (INDEPENDENT_AMBULATORY_CARE_PROVIDER_SITE_OTHER): Payer: Medicare Other | Admitting: *Deleted

## 2010-09-10 DIAGNOSIS — E119 Type 2 diabetes mellitus without complications: Secondary | ICD-10-CM

## 2010-09-10 MED ORDER — INSULIN ASPART PROT & ASPART (70-30 MIX) 100 UNIT/ML ~~LOC~~ SUSP: SUBCUTANEOUS | Status: DC

## 2010-09-10 MED ORDER — INSULIN PEN NEEDLE 30G X 8 MM MISC: Status: DC

## 2010-09-10 NOTE — Telephone Encounter (Signed)
Please clarify instruction for Pen Needles; should it be "use to take insulin twice daily" not "inject 20 each into skin" ? Thanks

## 2010-09-10 NOTE — Telephone Encounter (Signed)
Pt called back and stated he uses the pens not the vials.  Thanks

## 2010-09-10 NOTE — Telephone Encounter (Signed)
Wife states pt has not received his supply from [facility] b/c they were late sending in the money, which was sent last Wednesday.  She says it will take approx 2 weeks before they receive the shipment.  And he is completely out.  Wants to know if you can fax rx to [facility];just enough to last him until he receives the other shipment.  Thanks

## 2010-09-26 ENCOUNTER — Other Ambulatory Visit: Payer: Self-pay | Admitting: Internal Medicine

## 2010-10-17 ENCOUNTER — Other Ambulatory Visit: Payer: Self-pay | Admitting: *Deleted

## 2010-10-17 DIAGNOSIS — N400 Enlarged prostate without lower urinary tract symptoms: Secondary | ICD-10-CM

## 2010-10-17 MED ORDER — TERAZOSIN HCL 10 MG PO CAPS: 10.0000 mg | ORAL_CAPSULE | Freq: Every day | ORAL | Status: DC

## 2010-10-24 ENCOUNTER — Other Ambulatory Visit: Payer: Self-pay | Admitting: *Deleted

## 2010-10-24 DIAGNOSIS — E119 Type 2 diabetes mellitus without complications: Secondary | ICD-10-CM

## 2010-10-24 MED ORDER — PRAVASTATIN SODIUM 20 MG PO TABS: 20.0000 mg | ORAL_TABLET | Freq: Every day | ORAL | Status: DC

## 2010-11-06 ENCOUNTER — Other Ambulatory Visit: Payer: Self-pay | Admitting: Cardiology

## 2010-11-06 ENCOUNTER — Other Ambulatory Visit: Payer: Self-pay | Admitting: *Deleted

## 2010-11-06 DIAGNOSIS — I10 Essential (primary) hypertension: Secondary | ICD-10-CM

## 2010-11-06 MED ORDER — AMLODIPINE BESYLATE 10 MG PO TABS: 10.0000 mg | ORAL_TABLET | Freq: Every day | ORAL | Status: DC

## 2010-11-06 NOTE — Telephone Encounter (Signed)
Pt seen May and 2-3 month F/U requested but no appt in system. 3 month supply and request appt be made

## 2010-11-06 NOTE — Telephone Encounter (Signed)
Message to schedulers to schedule patient with an appointment.

## 2010-12-10 ENCOUNTER — Encounter: Payer: Medicare Other | Admitting: Internal Medicine

## 2010-12-12 ENCOUNTER — Encounter: Payer: Self-pay | Admitting: Internal Medicine

## 2010-12-12 ENCOUNTER — Ambulatory Visit (INDEPENDENT_AMBULATORY_CARE_PROVIDER_SITE_OTHER): Payer: Medicare Other | Admitting: Internal Medicine

## 2010-12-12 DIAGNOSIS — N289 Disorder of kidney and ureter, unspecified: Secondary | ICD-10-CM

## 2010-12-12 DIAGNOSIS — Z0000 Encounter for general adult medical examination without abnormal findings: Secondary | ICD-10-CM

## 2010-12-12 DIAGNOSIS — R609 Edema, unspecified: Secondary | ICD-10-CM

## 2010-12-12 DIAGNOSIS — N259 Disorder resulting from impaired renal tubular function, unspecified: Secondary | ICD-10-CM

## 2010-12-12 DIAGNOSIS — E11319 Type 2 diabetes mellitus with unspecified diabetic retinopathy without macular edema: Secondary | ICD-10-CM

## 2010-12-12 DIAGNOSIS — M25551 Pain in right hip: Secondary | ICD-10-CM

## 2010-12-12 DIAGNOSIS — E1139 Type 2 diabetes mellitus with other diabetic ophthalmic complication: Secondary | ICD-10-CM

## 2010-12-12 DIAGNOSIS — K219 Gastro-esophageal reflux disease without esophagitis: Secondary | ICD-10-CM

## 2010-12-12 DIAGNOSIS — M25552 Pain in left hip: Secondary | ICD-10-CM

## 2010-12-12 DIAGNOSIS — M25559 Pain in unspecified hip: Secondary | ICD-10-CM

## 2010-12-12 DIAGNOSIS — Z23 Encounter for immunization: Secondary | ICD-10-CM

## 2010-12-12 DIAGNOSIS — I10 Essential (primary) hypertension: Secondary | ICD-10-CM

## 2010-12-12 DIAGNOSIS — E119 Type 2 diabetes mellitus without complications: Secondary | ICD-10-CM

## 2010-12-12 LAB — COMPREHENSIVE METABOLIC PANEL
Albumin: 3.8 g/dL (ref 3.5–5.2)
BUN: 40 mg/dL — ABNORMAL HIGH (ref 6–23)
CO2: 22 mEq/L (ref 19–32)
Glucose, Bld: 88 mg/dL (ref 70–99)
Potassium: 4.9 mEq/L (ref 3.5–5.3)
Sodium: 139 mEq/L (ref 135–145)
Total Protein: 7.8 g/dL (ref 6.0–8.3)

## 2010-12-12 MED ORDER — AMLODIPINE BESYLATE 10 MG PO TABS: 10.0000 mg | ORAL_TABLET | Freq: Every day | ORAL | Status: DC

## 2010-12-12 MED ORDER — LISINOPRIL 10 MG PO TABS: 10.0000 mg | ORAL_TABLET | Freq: Every day | ORAL | Status: DC

## 2010-12-12 MED ORDER — FAMOTIDINE 40 MG PO TABS: 40.0000 mg | ORAL_TABLET | Freq: Every evening | ORAL | Status: DC

## 2010-12-12 MED ORDER — PRAVASTATIN SODIUM 20 MG PO TABS: 20.0000 mg | ORAL_TABLET | Freq: Every day | ORAL | Status: DC

## 2010-12-12 NOTE — Assessment & Plan Note (Addendum)
Will give annual flu vaccination today. 

## 2010-12-12 NOTE — Assessment & Plan Note (Signed)
Will refer for annual exam today.  She does not experiencing any vision changes

## 2010-12-12 NOTE — Progress Notes (Signed)
  Subjective:    Patient ID: [name] [name], male    DOB: 1945/04/12, 65 y.o.   MRN: [id number]  HPI  Pt reports recent flare of GERD that is now resolved after taking some ranitidine.  He reports morning soreness/pain all the way around his hips and intohis low back.  He notes the symptoms get better with movement.  He has not taken any otc pain meds.  He reports improved lower extremity edema. He is using Lasix rarely.    Review of Systems Denies chest pain, fever, chills, shortness of breath, nausea, vomiting, diarrhea, abdominal pain, visual changes, headache, weakness, tingling, numbness, dysuria, flank pain, hematuria, cough, malaise, diifculty sleeping, mood changes, syncope, hearing loss, difficulty swallowing, or other complaint    Objective:   Physical Exam  VItal signs reviewed and stable. GEN: No apparent distress.  Alert and oriented x 3.  Pleasant, conversant, and cooperative to exam. HEENT: head is autraumatic and normocephalic.  Neck is supple without palpable masses or lymphadenopathy.  No JVD or carotid bruits.  Vision intact.  EOMI.  PERRLA.  Sclerae anicteric.  Conjunctivae without pallor or injection. Mucous membranes are moist.  Oropharynx is without erythema, exudates, or other abnormal lesions.  Dentition is fair; several caries present.  No obvious draining lesions.   No facial erythema or swelling. RESP:  Lungs are clear to ascultation bilaterally with good air movement.  No wheezes, ronchi, or rubs. CARDIOVASCULAR: regular rate, normal rhythm.  Clear S1, S2, no murmurs, gallops, or rubs. ABDOMEN: soft, non-tender, non-distended.  Bowels sounds present in all quadrants and normoactive.  No palpable masses. EXT: warm and dry.  Peripheral pulses equal, intact, and +2 globally.  No clubbing or cyanosis. R AKA noted.  +Trace edema in LE SKIN: warm and dry with normal turgor.  No rashes or abnormal lesions observed. NEURO: CN II-XII grossly intact.  Muscle strength +5/5  in bilateral upper and lower extremities.  Sensation is grossly intact.  No focal deficit.      Assessment & Plan:

## 2010-12-12 NOTE — Assessment & Plan Note (Signed)
Patient with increasing lower extremity edema or other symptom. Will check comprehensive metabolic panel today to assess renal function.

## 2010-12-12 NOTE — Assessment & Plan Note (Signed)
Lower extremity gait is improved. He is only using Lasix on a rare basis when he feels his edema is significantly increased.  This is reasonable.

## 2010-12-12 NOTE — Assessment & Plan Note (Signed)
Patient reports pain in his belt line near his hips that extends to his low back. There are no abnormal exam findings.  His symptoms are likely the result of muscle strain in addition to likely degenerative joint disease and osteoarthritis. Advised him to use over-the-counter Tylenol for pain control and to try using a heating pad for muscle relaxation. We'll followup on the next office visit.

## 2010-12-12 NOTE — Assessment & Plan Note (Addendum)
Review of meter reveals ave CBGs of 200; no hypoglycemic events.  Pt due for annual exam; will refer today.  Will get an A1c today. We'll continue with his current insulin regimen

## 2010-12-12 NOTE — Assessment & Plan Note (Signed)
Symptoms are well-controlled with famotidine. Patient denies nausea, vomiting, hematemesis, difficulty swallowing, or painful swallowing.

## 2010-12-12 NOTE — Assessment & Plan Note (Signed)
Stable. We'll continue current antihypertensive regimen. Patient is not having any adverse effects from his medications. Will check a comprehensive metabolic panel today to assess laterally status and renal function.

## 2010-12-12 NOTE — Patient Instructions (Signed)
Schedule a followup appointment with Dr. Arvilla Market in December, sooner if needed I will call you if any of her laboratories are normal. Today to take your medications as directed. Try using a warm compress along your hips and abdomen, and back to help with pain and possible muscle strain. Use over-the-counter Tylenol as directed for pain relief.

## 2010-12-23 LAB — GLUCOSE, CAPILLARY: Glucose-Capillary: 78

## 2011-04-30 ENCOUNTER — Other Ambulatory Visit: Payer: Self-pay | Admitting: *Deleted

## 2011-04-30 DIAGNOSIS — I10 Essential (primary) hypertension: Secondary | ICD-10-CM

## 2011-04-30 MED ORDER — CARVEDILOL 12.5 MG PO TABS: 12.5000 mg | ORAL_TABLET | Freq: Two times a day (BID) | ORAL | Status: DC

## 2011-04-30 MED ORDER — AMLODIPINE BESYLATE 10 MG PO TABS: 10.0000 mg | ORAL_TABLET | Freq: Every day | ORAL | Status: DC

## 2011-05-22 ENCOUNTER — Ambulatory Visit (INDEPENDENT_AMBULATORY_CARE_PROVIDER_SITE_OTHER): Payer: Medicare Other | Admitting: Internal Medicine

## 2011-05-22 VITALS — BP 125/69 | HR 69 | Temp 96.9°F | Ht 68.0 in | Wt 220.0 lb

## 2011-05-22 DIAGNOSIS — D539 Nutritional anemia, unspecified: Secondary | ICD-10-CM

## 2011-05-22 DIAGNOSIS — I10 Essential (primary) hypertension: Secondary | ICD-10-CM

## 2011-05-22 DIAGNOSIS — D649 Anemia, unspecified: Secondary | ICD-10-CM

## 2011-05-22 DIAGNOSIS — Z79899 Other long term (current) drug therapy: Secondary | ICD-10-CM

## 2011-05-22 DIAGNOSIS — E119 Type 2 diabetes mellitus without complications: Secondary | ICD-10-CM

## 2011-05-22 DIAGNOSIS — N289 Disorder of kidney and ureter, unspecified: Secondary | ICD-10-CM

## 2011-05-22 DIAGNOSIS — E785 Hyperlipidemia, unspecified: Secondary | ICD-10-CM

## 2011-05-22 MED ORDER — FEXOFENADINE HCL 60 MG PO TABS: 30.0000 mg | ORAL_TABLET | Freq: Every day | ORAL | Status: DC

## 2011-05-22 NOTE — Progress Notes (Signed)
Subjective:     Patient ID: [name] [name], male   DOB: 1945-04-02, 66 y.o.   MRN: [id number]  HPI  patient is here for routine followup. When informed his hemoglobin A1c was elevated at 8 he states this is likely because he has been eating lots of cereal. He started eating cinnamon cereal thinking that the cinnamon would help lower his blood sugars further and after a few months realized that this cereal had lots of sugar in it.  He denies any episodes of hypoglycemia.  He has no other concerns or complaints today   Review of Systems  Constitutional: Negative for fever, chills, diaphoresis, activity change, appetite change, fatigue and unexpected weight change.  HENT: Negative for hearing loss, congestion and neck stiffness.   Eyes: Negative for photophobia, pain and visual disturbance.  Respiratory: Negative for cough, chest tightness, shortness of breath and wheezing.   Cardiovascular: Negative for chest pain and palpitations.  Gastrointestinal: Negative for abdominal pain, blood in stool and anal bleeding.  Genitourinary: Negative for dysuria, hematuria and difficulty urinating.  Musculoskeletal: Negative for joint swelling.  Neurological: Negative for dizziness, syncope, speech difficulty, weakness, numbness and headaches.      Objective:   Physical Exam VItal signs reviewed GEN: No apparent distress.  Alert and oriented x 3.  Pleasant, conversant, and cooperative to exam. HEENT: head is autraumatic and normocephalic.  Neck is supple without palpable masses or lymphadenopathy.  No JVD or carotid bruits.  Vision intact.  EOMI.  PERRLA.  Sclerae anicteric.  Conjunctivae without pallor or injection. Mucous membranes are moist.  Oropharynx is without erythema, exudates, or other abnormal lesions.  Dentition is fair; several caries present.  No obvious draining lesions.   No facial erythema or swelling. RESP:  Lungs are clear to ascultation bilaterally with good air movement.  No wheezes,  ronchi, or rubs. CARDIOVASCULAR: regular rate, normal rhythm.  Clear S1, S2, no murmurs, gallops, or rubs. ABDOMEN: soft, non-tender, non-distended.  Bowels sounds present in all quadrants and normoactive.  No palpable masses. EXT: warm and dry.  Peripheral pulses equal, intact, and +2 globally.  No clubbing or cyanosis. R AKA noted.  +Trace edema in LE SKIN: warm and dry with normal turgor.  No rashes or abnormal lesions observed. NEURO: CN II-XII grossly intact.  Muscle strength +5/5 in bilateral upper and lower extremities.  Sensation is grossly intact.  No focal deficit.    Assessment/Plan:

## 2011-05-22 NOTE — Patient Instructions (Signed)
Schedule followup appointment in 2 weeks with Dr. Arvilla Market. Bring your blood sugar meter with a two-year next office visit. Try to cut down to only 1 cutie a day.   Fexofenadine is a new medicine to help with your sinuses.  Take one pill every day.

## 2011-05-23 ENCOUNTER — Ambulatory Visit (HOSPITAL_COMMUNITY)
Admission: RE | Admit: 2011-05-23 | Discharge: 2011-05-23 | Disposition: A | Discharge status: Home / Self Care | Payer: Medicare Other | Source: Ambulatory Visit | Attending: Internal Medicine | Admitting: Internal Medicine

## 2011-05-23 ENCOUNTER — Telehealth: Payer: Self-pay | Admitting: Internal Medicine

## 2011-05-23 ENCOUNTER — Other Ambulatory Visit (INDEPENDENT_AMBULATORY_CARE_PROVIDER_SITE_OTHER): Payer: Medicare Other

## 2011-05-23 DIAGNOSIS — R9431 Abnormal electrocardiogram [ECG] [EKG]: Secondary | ICD-10-CM | POA: Insufficient documentation

## 2011-05-23 DIAGNOSIS — E875 Hyperkalemia: Secondary | ICD-10-CM

## 2011-05-23 LAB — COMPREHENSIVE METABOLIC PANEL
Albumin: 3.3 g/dL — ABNORMAL LOW (ref 3.5–5.2)
BUN: 53 mg/dL — ABNORMAL HIGH (ref 6–23)
Calcium: 8.8 mg/dL (ref 8.4–10.5)
Chloride: 110 mEq/L (ref 96–112)
Glucose, Bld: 123 mg/dL — ABNORMAL HIGH (ref 70–99)
Potassium: 5.6 mEq/L — ABNORMAL HIGH (ref 3.5–5.3)

## 2011-05-23 LAB — BASIC METABOLIC PANEL
BUN: 54 mg/dL — ABNORMAL HIGH (ref 6–23)
CO2: 20 mEq/L (ref 19–32)
Chloride: 109 mEq/L (ref 96–112)
Glucose, Bld: 153 mg/dL — ABNORMAL HIGH (ref 70–99)
Potassium: 4.7 mEq/L (ref 3.5–5.3)
Sodium: 137 mEq/L (ref 135–145)

## 2011-05-23 LAB — LIPID PANEL
Cholesterol: 85 mg/dL (ref 0–200)
Triglycerides: 235 mg/dL — ABNORMAL HIGH (ref <150)
VLDL: 47 mg/dL — ABNORMAL HIGH (ref 0–40)

## 2011-05-23 LAB — CBC
Hemoglobin: 10.3 g/dL — ABNORMAL LOW (ref 13.0–17.0)
RBC: 4.49 MIL/uL (ref 4.22–5.81)

## 2011-05-23 NOTE — Telephone Encounter (Signed)
Called pt to discuss results of cmet, particularly K of 5.6  with no visible hemolysis.  Pt will return today for blood draw to check K; will also obtain EKG.

## 2011-05-23 NOTE — Telephone Encounter (Signed)
Addended by: Nelda Bucks T on: 05/23/2011 01:40 PM   Modules accepted: Orders

## 2011-06-05 ENCOUNTER — Ambulatory Visit (INDEPENDENT_AMBULATORY_CARE_PROVIDER_SITE_OTHER): Payer: Medicare Other | Admitting: Internal Medicine

## 2011-06-05 ENCOUNTER — Encounter: Payer: Self-pay | Admitting: Internal Medicine

## 2011-06-05 VITALS — BP 125/68 | HR 83 | Temp 96.2°F | Ht 68.0 in | Wt 220.9 lb

## 2011-06-05 DIAGNOSIS — E119 Type 2 diabetes mellitus without complications: Secondary | ICD-10-CM

## 2011-06-05 NOTE — Assessment & Plan Note (Signed)
CBGs still elevated and averaging in the 200s; this is slightly improved from last week but not yet at goal. Discussed improved eating habits at length.  Patient would like to attempt to improve his diabetic control with diet before increasing insulin. I agree that this is reasonable as he has maintained an A1c at goal in the past and admits to poor diet over the past few months. Discussed the need to avoid high carbohydrate-containing foods including breads pasta, cereal, and grains.  Also emphasized the importance of regular exercise and activity. Patient is an avid gardener and now that the weather is warmer, he will be outside and more active than he has been in the past few months. I will bring him back in May to review his CBGs again.  If his CBGs remain elevated, will plan to increase insulin.

## 2011-06-05 NOTE — Patient Instructions (Signed)
Schedule followup appointment with Dr. Arvilla Market in the beginning of May. Continue to work on your diet and cut down on the amount of carbohydrate-containing foods. These foods include bread, cereals, grains. pasta, and fruits (especially bananas and grapes). Continue to check your blood sugar regularly. If your sugars remain elevated we will likely need to increase your insulin.

## 2011-06-05 NOTE — Progress Notes (Signed)
Patient ID: [name] [name], male   DOB: 14-Apr-1945, 66 y.o.   MRN: [id number]   HPI:  Pt is here for followup of his blood sugars. At his last appointment his A1c was elevated above goal.  Review of his glucometer today reveals CBGs persisting in the 200s.  Patient states he started to make dietary changes but knows he is still eating more bread and carbohydrate-containing foods than previously.  Review of Systems Denies chest pain, fever, chills, shortness of breath, nausea, vomiting, diarrhea, abdominal pain, visual changes, headache, weakness, tingling, numbness, dysuria, flank pain, hematuria, cough, malaise, diifculty sleeping, mood changes, syncope, hearing loss, difficulty swallowing, or other complaint    Objective:   Physical Exam  VItal signs reviewed and stable. GEN: No apparent distress.  Alert and oriented x 3.  Pleasant, conversant, and cooperative to exam. HEENT: head is autraumatic and normocephalic.   RESP:  Lungs are clear to ascultation bilaterally with good air movement.  No wheezes, ronchi, or rubs. CARDIOVASCULAR: regular rate, normal rhythm.  Clear S1, S2, no murmurs, gallops, or rubs. ABDOMEN: soft, non-tender, non-distended.  Bowels sounds present in all quadrants and normoactive.  No palpable masses. EXT: warm and dry.  Peripheral pulses equal, intact, and +2 globally.  No clubbing or cyanosis. R AKA noted.  +Trace edema in LE  Assessment & Plan:

## 2011-06-06 NOTE — Assessment & Plan Note (Addendum)
Patient denies any herbal correcting, dark tarry stools, or other abnormal blood loss. We will check a CBC today

## 2011-06-06 NOTE — Assessment & Plan Note (Signed)
His blood pressure stable and well controlled on his current regimen. Patient is tolerating his antihypertensives without adverse effect. We'll check metabolic count today to assess electrolyte status and renal function.

## 2011-06-06 NOTE — Assessment & Plan Note (Signed)
Patient is tolerating statin well. We'll check liver function tests and lipid profile today

## 2011-06-06 NOTE — Assessment & Plan Note (Signed)
Patient's hemoglobin A1c is above goal.  This is likely the result of his increased consumption of foods high in carbohydrates. I asked him to stop eating lots of this cinnamon cereal and avoid other foods high in carbohydrates for the next few weeks. I have asked him to to bring his meter with him for a followup visit in 2 weeks to see if there is any significant change in his CBGs following removal of cereal from his diet.  Patient is agreeable to this plan.  Diabetic foot exam performed today

## 2011-06-23 ENCOUNTER — Telehealth: Payer: Self-pay | Admitting: *Deleted

## 2011-06-23 ENCOUNTER — Other Ambulatory Visit: Payer: Self-pay | Admitting: Family Medicine

## 2011-06-23 NOTE — Telephone Encounter (Signed)
Patient currently not using Lasix; this was removed from his medication.  Will not submit refill.  Thanks!

## 2011-06-23 NOTE — Telephone Encounter (Signed)
[facility]/ [place] needs refill on pt for Furosemide 20mg  #30 - last refill 03/31/11. Stanton Kidney Ellington Cornia RN 06/23/11 11:10AM

## 2011-06-28 ENCOUNTER — Other Ambulatory Visit: Payer: Self-pay | Admitting: Family Medicine

## 2011-07-28 ENCOUNTER — Encounter: Payer: Self-pay | Admitting: Internal Medicine

## 2011-07-28 ENCOUNTER — Ambulatory Visit (INDEPENDENT_AMBULATORY_CARE_PROVIDER_SITE_OTHER): Payer: Medicare Other | Admitting: Internal Medicine

## 2011-07-28 VITALS — BP 132/78 | HR 70 | Temp 98.0°F | Ht 68.0 in | Wt 223.0 lb

## 2011-07-28 DIAGNOSIS — E119 Type 2 diabetes mellitus without complications: Secondary | ICD-10-CM

## 2011-07-28 DIAGNOSIS — I10 Essential (primary) hypertension: Secondary | ICD-10-CM

## 2011-07-28 NOTE — Assessment & Plan Note (Signed)
Blood pressure at goal today. Patient tolerating his current medication regimen well; will therefore not make any changes. Metabolic panel recently checked; will repeat be met at his next office visit.

## 2011-07-28 NOTE — Assessment & Plan Note (Signed)
Patient has done well with diet and lifestyle changes.  His A1c today is down to 7.1 from 8.1.  Unfortunately, the computer is not able to download data from his meter but his individual readings appear overall improved.   Will not make any changes to his regimen at this time.

## 2011-07-28 NOTE — Patient Instructions (Addendum)
Schedule a follow up appointment with Dr. Arvilla Market on Friday June 7 in the afternoon. Enjoy working in your yard!!! You can use over the counter tylenol as directed on the packaging for relief of your pain. Keep taking your medications as directed. Call the clinic at [phone] with any concerns or questions.

## 2011-07-28 NOTE — Progress Notes (Signed)
Patient ID: [name] [name], male   DOB: 1946-01-17, 66 y.o.   MRN: [id number]  HPI:  Pt is 66 year old gentleman here today for followup of his diabetes.  AT his office visit in February, patient's A1c was elevated above goal at 8.1 resulting from increased intake of carbohydrate-containing foods.  Patient expressed his desire for a trial of healthier lifestyle habits including diet changes and increased physical activity outside before proceeding with adjustment insulin.  Today, review of his meter reveals CBGs ranging from 114-120s in am and 160s in the pm..  He has been taking 40 units of insulin 70/30 in the morning and 30 units in the evening.  CBG elevated on arrival is morning but improved from last office visit; shortly patient states it's high today after eating oxtail last night.  Laser coagulation in right eye scheduled this Thursday by Dr. Luciana Axe.  Paitent completed a 7d course of abx ~1 week ago for UTI.  He denies any current dysuria, hematuria, fevers, or chills.    He reports recent worsening of left hip pain over th past two weeks.  He reports the pain worsens with movement and when bending over to reach for things.  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  He has not noticed any alleviated.  He has tried OTC tylenol with good relief of his pain.  He would like to hold off on repeating any imaging of his huip.    Review of Systems Constitutional: Negative for fever, chills, diaphoresis, activity change, appetite change, fatigue and unexpected weight change.  HENT: Negative for hearing loss, congestion and neck stiffness.   Eyes: Negative for photophobia, pain and visual disturbance.  Respiratory: Negative for cough, chest tightness, shortness of breath and wheezing.   Cardiovascular: Negative for chest pain and palpitations.  Gastrointestinal: Negative for abdominal pain, blood in stool and anal bleeding.    Genitourinary: Negative for dysuria, hematuria and difficulty urinating.  Musculoskeletal: Negative for joint swelling.  Neurological: Negative for dizziness, syncope, speech difficulty, weakness, numbness and headaches.      Objective:   Physical Exam VItal signs reviewed and stable. GEN: No apparent distress.  Alert and oriented x 3.  Pleasant, conversant, and cooperative to exam. HEENT: head is autraumatic and normocephalic.   RESP:  Lungs are clear to ascultation bilaterally with good air movement.  No wheezes, ronchi, or rubs. CARDIOVASCULAR: regular rate, normal rhythm.  Clear S1, S2, no murmurs, gallops, or rubs. ABDOMEN: soft, non-tender, non-distended.  Bowels sounds present in all quadrants and normoactive.  No palpable masses. EXT: warm and dry.  Peripheral pulses equal, intact, and +2 globally.  No clubbing or cyanosis. R AKA noted.  +Trace edema in LE  Assessment & Plan:

## 2011-08-29 ENCOUNTER — Ambulatory Visit (INDEPENDENT_AMBULATORY_CARE_PROVIDER_SITE_OTHER): Payer: Medicare Other | Admitting: Internal Medicine

## 2011-08-29 ENCOUNTER — Encounter: Payer: Self-pay | Admitting: Internal Medicine

## 2011-08-29 VITALS — BP 143/71 | HR 67 | Temp 96.7°F | Ht 68.0 in | Wt 220.5 lb

## 2011-08-29 DIAGNOSIS — I10 Essential (primary) hypertension: Secondary | ICD-10-CM

## 2011-08-29 DIAGNOSIS — E785 Hyperlipidemia, unspecified: Secondary | ICD-10-CM

## 2011-08-29 DIAGNOSIS — E119 Type 2 diabetes mellitus without complications: Secondary | ICD-10-CM

## 2011-08-29 LAB — GLUCOSE, CAPILLARY: Glucose-Capillary: 152 mg/dL — ABNORMAL HIGH (ref 70–99)

## 2011-08-29 MED ORDER — FLUTICASONE PROPIONATE 50 MCG/ACT NA SUSP: 2.0000 | Freq: Every day | NASAL | Status: DC

## 2011-08-29 MED ORDER — INSULIN ASPART PROT & ASPART (70-30 MIX) 100 UNIT/ML ~~LOC~~ SUSP: SUBCUTANEOUS | Status: DC

## 2011-08-29 MED ORDER — PRAVASTATIN SODIUM 20 MG PO TABS: 20.0000 mg | ORAL_TABLET | Freq: Every day | ORAL | Status: DC

## 2011-08-29 MED ORDER — LISINOPRIL 10 MG PO TABS: 10.0000 mg | ORAL_TABLET | Freq: Every day | ORAL | Status: DC

## 2011-08-29 NOTE — Assessment & Plan Note (Signed)
Patient's LDL is at goal on 20 mg of Pravachol. Liver function tests were within normal limits when checked at his last office visit.  He is tolerating Pravachol without adverse effect.  Will continue pravastatin at current dosing.

## 2011-08-29 NOTE — Assessment & Plan Note (Signed)
Patient's blood pressure slightly elevated above goal today.   He believes this is the result of frustration regarding use of his own wheelchair to come to his clinic appointment.  His blood pressure has been well-controlled in the past on his current regimen. Will not make any changes to his antihypertensives today.  Discussed the possibility of obtaining a metabolic panel to assess electrolytes and renal function today; will defer this to his next office visit when his A1c is due. Patient wishes to consolidate all blood draws.  I believe this is reasonable.

## 2011-08-29 NOTE — Progress Notes (Signed)
Patient ID: [name] [name], male   DOB: [birth year], 66 y.o.   MRN: [id number]  PI:  Pt is 66 year old gentleman here today for followup of his diabetes.  In February, his A1c rose above goal to 8.1.  The patient subsequently made some dietary changes and improved his hemoglobin A1c to 7.1 when checked in early May.   Today, review of his CBG log reveals CBGs ranging from 110s - 200s; average of 140 .  He has been taking 40 units of insulin 70/30 in the morning and 30 units in the evening.   He denies any symptoms of hypoglycemia.  He has no other concerns or complaints today.  Review of Systems Constitutional: Negative for fever, chills, diaphoresis, activity change, appetite change, fatigue and unexpected weight change.  HENT: Negative for hearing loss, congestion and neck stiffness.   Eyes: Negative for photophobia, pain and visual disturbance.  Respiratory: Negative for cough, chest tightness, shortness of breath and wheezing.   Cardiovascular: Negative for chest pain and palpitations.  Gastrointestinal: Negative for abdominal pain, blood in stool and anal bleeding.  Genitourinary: Negative for dysuria, hematuria and difficulty urinating.  Musculoskeletal: Negative for joint swelling.  Neurological: Negative for dizziness, syncope, speech difficulty, weakness, numbness and headaches.      Objective:   Physical Exam VItal signs reviewed and stable. GEN: No apparent distress.  Alert and oriented x 3.  Pleasant, conversant, and cooperative to exam. HEENT: head is autraumatic and normocephalic.   RESP:  Lungs are clear to ascultation bilaterally with good air movement.  No wheezes, ronchi, or rubs. CARDIOVASCULAR: regular rate, normal rhythm.  Clear S1, S2, no murmurs, gallops, or rubs. ABDOMEN: soft, non-tender, non-distended.  Bowels sounds present in all quadrants and normoactive.  No palpable masses. EXT: warm and dry.  Peripheral pulses equal, intact, and +2 globally.  No clubbing or  cyanosis. R AKA noted.  +Trace edema in LE, slightly improved from last OV.  Assessment & Plan:

## 2011-08-29 NOTE — Patient Instructions (Signed)
Schedule a followup appointment in 2 months to meet your new primary care doctor. You will need blood work at your next appointment including a hemoglobin A1c and metabolic panel to look at your electrolytes and kidney function. Continue to take all of your medicines as directed.  Call the clinic at [phone] with any concerns or questions. Keep up the good work!

## 2011-08-29 NOTE — Assessment & Plan Note (Signed)
Patient continues to do well managing his diabetes.  He is not having any hypoglycemia or significant hyperglycemia.  He is up to date on annual eye exam, foot exam, and will not be due for A1c until early August.  He is advised to f/u in 2-3 months to meet his new PCP and to have blood work drawn including CMET, and A1c.

## 2011-12-01 ENCOUNTER — Encounter: Payer: Self-pay | Admitting: Internal Medicine

## 2011-12-01 ENCOUNTER — Ambulatory Visit (INDEPENDENT_AMBULATORY_CARE_PROVIDER_SITE_OTHER): Payer: Medicare Other | Admitting: Internal Medicine

## 2011-12-01 VITALS — BP 122/65 | HR 77 | Temp 98.0°F | Ht 68.0 in | Wt 211.6 lb

## 2011-12-01 DIAGNOSIS — E119 Type 2 diabetes mellitus without complications: Secondary | ICD-10-CM

## 2011-12-01 DIAGNOSIS — Z23 Encounter for immunization: Secondary | ICD-10-CM

## 2011-12-01 DIAGNOSIS — R21 Rash and other nonspecific skin eruption: Secondary | ICD-10-CM | POA: Insufficient documentation

## 2011-12-01 DIAGNOSIS — I10 Essential (primary) hypertension: Secondary | ICD-10-CM

## 2011-12-01 MED ORDER — CETIRIZINE HCL 10 MG PO CAPS: ORAL_CAPSULE | ORAL | Status: DC

## 2011-12-01 MED ORDER — HALOBETASOL PROPIONATE 0.05 % EX CREA: TOPICAL_CREAM | CUTANEOUS | Status: DC

## 2011-12-01 MED ORDER — AMLODIPINE BESYLATE 10 MG PO TABS: 10.0000 mg | ORAL_TABLET | Freq: Every day | ORAL | Status: DC

## 2011-12-01 MED ORDER — HYDROXYZINE PAMOATE 25 MG PO CAPS: 25.0000 mg | ORAL_CAPSULE | Freq: Every evening | ORAL | Status: AC | PRN

## 2011-12-01 MED ORDER — CARVEDILOL 12.5 MG PO TABS: 12.5000 mg | ORAL_TABLET | Freq: Two times a day (BID) | ORAL | Status: DC

## 2011-12-01 MED ORDER — MUPIROCIN CALCIUM 2 % EX CREA: TOPICAL_CREAM | Freq: Three times a day (TID) | CUTANEOUS | Status: AC

## 2011-12-01 NOTE — Assessment & Plan Note (Addendum)
Likely multifactorial. He probably had a drug rxn which has a bacterial superinfection on his back and arm.  He also likely has contact dermatitis from his watch as well as from new fabric softener. - mix small amount of Chlorox with warm water to wash his back -Also use Bactroban cream for back lesions- folliculitis. Trying to avoid doxy. If still not improved, will need to try Bactrim -Start using Ultravate cream on the affected area including wrist and arms,  do not use this cream on face, axillary and inguinal areas -Take Zyrtec 10 mg by mouth daily (he usually takes Claritin for allergy but I think zyrtec will be better in this setting) -Take hydroxyzine pamoate 25 mg by mouth each bedtime when necessary for severe itching -Stop using new fabric softener and use very mild soap to wash -Followup with PCP in one month or sooner if rash does not improve and we can consider oral steroids if needed  Case discussed and pt was evaluated by Dr. Criselda Peaches

## 2011-12-01 NOTE — Progress Notes (Signed)
HPI: [name] [name] is a 66 yo M with PMH of DM II, HLP, peripheral neuropathy, HTN presents today for rash. Reports that he had a bladder infection in August and was treated with doxycycline which he completed the course. After he started taking doxycycline, he started to notice itching and bumps on his back as well as his arms. He also has a left wrist rash on where his watch use to be and that has been itching as well. Of note, patient and the wife also report that she has changed fabric softener which is when she noticed all the rashes also appeared at that time.  Denies any swelling or fever.  He does have severe pruritis at bedtime on his right arm along with tiny bumps.  ROS: as per HPI  PE: General: alert, well-developed, and cooperative to examination.  Lungs: normal respiratory effort, no accessory muscle use, normal breath sounds, no crackles, and no wheezes. Heart: normal rate, regular rhythm, no murmur, no gallop, and no rub.  Abdomen: soft, non-tender, normal bowel sounds, no distention, no guarding, no rebound tenderness Msk: no joint swelling, no joint warmth, and no redness over joints.  Neurologic: alert & oriented X3, cranial nerves II-XII intact, strength normal in all extremities, sensation intact to light touch, and sitting in wheelchair Skin: turgor normal and+ rashes: multiple small pustular lesions on back consistent with folliculitis, 1x0.5cm oval lesion with scabbing and pink granulation tissue on lateral aspect of left back.  Scattered diffused nodular lesions on right arm with excoriation marks.  Left wrist: scaley, 2x3cm circular in the areas of the watch previously worn with outlines, no erythema or drainage.  Psych: Oriented X3, memory intact for recent and remote, normally interactive, good eye contact, not anxious appearing, and not depressed appearing.

## 2011-12-01 NOTE — Patient Instructions (Addendum)
You may mix small amount of Chlorox with warm water and wash your back Also use Bactroban cream to rub on your back for your skin lesions Start using Ultravate cream on the affected area including your wrist and arms they do not use this cream on your face, axillary and inguinal areas Take Zyrtec 10 mg by mouth daily Take hydroxyzine pamoate 25 mg by mouth each bedtime when necessary for severe itching Followup with your PCP in one month or sooner if your rash does not improve

## 2012-01-15 ENCOUNTER — Encounter: Payer: Self-pay | Admitting: Internal Medicine

## 2012-01-15 ENCOUNTER — Ambulatory Visit (INDEPENDENT_AMBULATORY_CARE_PROVIDER_SITE_OTHER): Payer: Medicare Other | Admitting: Internal Medicine

## 2012-01-15 VITALS — BP 138/86 | HR 72 | Temp 97.4°F | Wt 213.1 lb

## 2012-01-15 DIAGNOSIS — Z0000 Encounter for general adult medical examination without abnormal findings: Secondary | ICD-10-CM

## 2012-01-15 DIAGNOSIS — I2510 Atherosclerotic heart disease of native coronary artery without angina pectoris: Secondary | ICD-10-CM

## 2012-01-15 DIAGNOSIS — E785 Hyperlipidemia, unspecified: Secondary | ICD-10-CM

## 2012-01-15 DIAGNOSIS — N400 Enlarged prostate without lower urinary tract symptoms: Secondary | ICD-10-CM

## 2012-01-15 DIAGNOSIS — E663 Overweight: Secondary | ICD-10-CM | POA: Insufficient documentation

## 2012-01-15 DIAGNOSIS — J302 Other seasonal allergic rhinitis: Secondary | ICD-10-CM

## 2012-01-15 DIAGNOSIS — E1142 Type 2 diabetes mellitus with diabetic polyneuropathy: Secondary | ICD-10-CM

## 2012-01-15 DIAGNOSIS — I10 Essential (primary) hypertension: Secondary | ICD-10-CM

## 2012-01-15 DIAGNOSIS — E66811 Obesity, class 1: Secondary | ICD-10-CM

## 2012-01-15 DIAGNOSIS — E119 Type 2 diabetes mellitus without complications: Secondary | ICD-10-CM

## 2012-01-15 DIAGNOSIS — E669 Obesity, unspecified: Secondary | ICD-10-CM

## 2012-01-15 DIAGNOSIS — E1149 Type 2 diabetes mellitus with other diabetic neurological complication: Secondary | ICD-10-CM

## 2012-01-15 DIAGNOSIS — J309 Allergic rhinitis, unspecified: Secondary | ICD-10-CM

## 2012-01-15 DIAGNOSIS — M19049 Primary osteoarthritis, unspecified hand: Secondary | ICD-10-CM

## 2012-01-15 HISTORY — DX: Obesity, unspecified: E66.9

## 2012-01-15 HISTORY — DX: Obesity, class 1: E66.811

## 2012-01-15 LAB — GLUCOSE, CAPILLARY: Glucose-Capillary: 184 mg/dL — ABNORMAL HIGH (ref 70–99)

## 2012-01-15 MED ORDER — OMEGA-3 FATTY ACIDS 1000 MG PO CAPS: 1.0000 g | ORAL_CAPSULE | Freq: Two times a day (BID) | ORAL | Status: DC

## 2012-01-15 MED ORDER — CALCIUM CITRATE-VITAMIN D 315-200 MG-UNIT PO TABS: 2.0000 | ORAL_TABLET | Freq: Two times a day (BID) | ORAL | Status: DC

## 2012-01-15 MED ORDER — ASPIRIN 81 MG PO TABS: 81.0000 mg | ORAL_TABLET | Freq: Every day | ORAL | Status: DC

## 2012-01-15 NOTE — Assessment & Plan Note (Signed)
Flu, pneumovax, and tetanus up to date.  Had colonoscopy within the last several years at a GI office near [facility].   We will try to get the records from the colonoscopy.

## 2012-01-15 NOTE — Assessment & Plan Note (Signed)
Not interested in anti-inflammatories as discomfort is minimal.  Will continue to follow clinically.

## 2012-01-15 NOTE — Assessment & Plan Note (Signed)
Excellent control during allergy season with the Flonase.  Will continue each allergy season.

## 2012-01-15 NOTE — Assessment & Plan Note (Signed)
LDL well within target within the last year.  Will continue the pravastatin 20 mg PO QHS.

## 2012-01-15 NOTE — Assessment & Plan Note (Signed)
Hgb A1C was 6.6 today.  Will continue the Novolog 70/30 40 units QAM and 30 units QPM.  He was also encouraged to continue his diet and activity level.  Microalbumin is pending as of this note.  Diabetic eye and foot examinations are up to date.  BP and lipids are at target.  Follow-up with repeat Hgb A1C in 3 months.

## 2012-01-15 NOTE — Progress Notes (Signed)
Subjective:    Patient ID: [name] [name], male    DOB: 1945/08/02, 66 y.o.   MRN: [id number]  HPI  [name] [name] presents today for follow-up of the following:  Diabetes He has been taking the novolog 70/30 40 units in the AM and 30 units in the evening.  He has tolerated this dose well with most glucoses less than 200.  He has had no symptomatic hypoglycemias.  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  He has occasional non-painful tingling at the bottom of his left foot with loss of sensation.  He checks his foot daily and wears good socks and diabetic shoes.  He denies any claudication in the left lower extremity and has had no falls in the last 3 years with his prothesis.  His erectile dysfunction was not discussed during this visit.  Hypertension He has tolerated the amlodipine 10 mg, carvedilol 12.5 mg, lisinopril 10 mg, and terazosin 10 mg without side effects.  Hyperlipidemia He has had no myalgias with the pravastatin 20 mg.  He has also tolerated the OTC fish oil supplements well.  Coronary Artery Disease He has had no chest pain or DOE on the ASA, carvedilol, and amlodipine.  Seasonal Allergic Rhinitis Well controlled on the Flonase which he only uses in season.  Benign Prostatic Hypertrophy Nocturia X 2-3 per night on the terazosin.  He denies any other urinary complaints.  DJD of Multiple Joints Not clinically significant at this time and he is not interested in any anti-inflammatories.  Review of Systems  Constitutional: Negative for fever, chills, diaphoresis, activity change, appetite change, fatigue and unexpected weight change.  HENT: Positive for dental problem. Negative for congestion, rhinorrhea, sneezing, neck pain, neck stiffness and postnasal drip.   Eyes: Negative for photophobia, pain, discharge, redness, itching and visual disturbance.  Respiratory: Negative for cough, chest tightness, shortness of breath and wheezing.     Cardiovascular: Positive for leg swelling. Negative for chest pain and palpitations.  Gastrointestinal: Negative for nausea, vomiting, abdominal pain, diarrhea and abdominal distention.  Genitourinary: Positive for frequency. Negative for dysuria, urgency, hematuria, flank pain, decreased urine volume and difficulty urinating.  Musculoskeletal: Positive for back pain and arthralgias. Negative for myalgias, joint swelling and gait problem.  Skin: Negative for rash and wound.  Neurological: Positive for numbness. Negative for dizziness, seizures, syncope, weakness and light-headedness.  Psychiatric/Behavioral: Negative for behavioral problems, confusion, disturbed wake/sleep cycle, dysphoric mood, decreased concentration and agitation. The patient is not nervous/anxious.       Objective:   Physical Exam  Nursing note and vitals reviewed. Constitutional: He is oriented to person, place, and time. He appears well-developed and well-nourished. No distress.  HENT:  Head: Normocephalic and atraumatic.  Eyes: Conjunctivae normal are normal. No scleral icterus.  Neck: Normal range of motion. Neck supple. No thyromegaly present.  Cardiovascular: Normal rate, regular rhythm and normal heart sounds.  Exam reveals no gallop and no friction rub.   No murmur heard. Pulmonary/Chest: Effort normal and breath sounds normal. No respiratory distress. He has no wheezes. He has no rales. He exhibits no tenderness.  Abdominal: Soft. Bowel sounds are normal. He exhibits no distension. There is no tenderness. There is no rebound and no guarding.  Musculoskeletal: Normal range of motion. He exhibits edema. He exhibits no tenderness.       Right upper leg: He exhibits deformity.       Legs: Lymphadenopathy:    He has no cervical adenopathy.  Neurological: He is alert and oriented to person, place, and time. He exhibits normal muscle tone.  Skin: Skin is warm and dry. No rash noted. He is not diaphoretic. No  erythema.  Psychiatric: He has a normal mood and affect. His behavior is normal. Judgment and thought content normal.      Assessment & Plan:   Please see problem oriented charting.

## 2012-01-15 NOTE — Assessment & Plan Note (Signed)
No chest pain or activity limitations.  Will continue the ASA, blood pressure medications, and statin.

## 2012-01-15 NOTE — Assessment & Plan Note (Signed)
Blood pressure well controlled on the amlodipine 10 mg, lisinopril 10 mg, terazosin 10 mg, and carvedilol 12.5 mg BID.  We will continue.

## 2012-01-15 NOTE — Patient Instructions (Signed)
It was nice to meet you.  I look forward to taking care of you for years to come.  1) You are doing a great job with your diabetes.  Keep doing what you are doing with your insulin.  2) Keep with the blood pressure and cholesterol medications.  I will see you back in 3 months.

## 2012-01-15 NOTE — Assessment & Plan Note (Signed)
Not bothered by nocturia X 3 and tolerating the terazosin w/o dizziness.  Will continue at 10 mg PO QHS.

## 2012-01-16 ENCOUNTER — Encounter: Payer: Medicare Other | Admitting: Internal Medicine

## 2012-01-16 LAB — MICROALBUMIN / CREATININE URINE RATIO: Microalb Creat Ratio: 290.3 mg/g — ABNORMAL HIGH (ref 0.0–30.0)

## 2012-04-09 ENCOUNTER — Encounter (HOSPITAL_COMMUNITY): Payer: Self-pay | Admitting: *Deleted

## 2012-04-09 ENCOUNTER — Emergency Department (INDEPENDENT_AMBULATORY_CARE_PROVIDER_SITE_OTHER)
Admission: EM | Admit: 2012-04-09 | Discharge: 2012-04-09 | Disposition: A | Discharge status: Home / Self Care | Payer: Medicare Other | Source: Home / Self Care

## 2012-04-09 DIAGNOSIS — K029 Dental caries, unspecified: Secondary | ICD-10-CM

## 2012-04-09 DIAGNOSIS — K089 Disorder of teeth and supporting structures, unspecified: Secondary | ICD-10-CM

## 2012-04-09 DIAGNOSIS — K0889 Other specified disorders of teeth and supporting structures: Secondary | ICD-10-CM

## 2012-04-09 MED ORDER — HYDROCODONE-ACETAMINOPHEN 5-325 MG PO TABS: 1.0000 | ORAL_TABLET | ORAL | Status: DC | PRN

## 2012-04-09 MED ORDER — AMOXICILLIN 500 MG PO CAPS: 500.0000 mg | ORAL_CAPSULE | Freq: Four times a day (QID) | ORAL | Status: DC

## 2012-04-09 NOTE — ED Provider Notes (Signed)
Medical screening examination/treatment/procedure(s) were performed by resident physician or non-physician practitioner and as supervising physician I was immediately available for consultation/collaboration.   Barkley Bruns MD.    Linna Hoff, MD 04/09/12 2022

## 2012-04-09 NOTE — ED Notes (Signed)
Pt reports dental pain - given list of dentist by nurse

## 2012-04-09 NOTE — ED Provider Notes (Signed)
History     CSN: [id number]  Arrival date & time 04/09/12  1002   None     Chief Complaint  Patient presents with  . Dental Pain    (Consider location/radiation/quality/duration/timing/severity/associated sxs/prior treatment) HPI Comments: 67 year old male with extremely poor dentition missing several teeth with toothache in his right lower Premolar. There is redness and swelling of the gingiva associated with the involved tooth. No fever or chills. Denies constitutional symptoms   Past Medical History  Diagnosis Date  . Anemia     Microcytic  . Allergy     Spring and Summer  . Arthritis     Bilateral hands  . Diabetes mellitus without complication   . Hyperlipidemia   . Hypertension   . Chronic kidney disease     Past Surgical History  Procedure Date  . Pilonidal cyst excision   . Eye surgery     Laser  . Fracture surgery     Fractured stump    Family History  Problem Relation Age of Onset  . Hypertension Mother   . Heart attack Father   . Breast cancer Sister   . Arthritis Sister   . Heart attack Brother   . Diabetes Brother   . Stroke Brother   . Pneumonia Daughter   . Diabetes Brother   . Alcoholism Brother   . Diabetes Brother   . Arthritis Sister     Bilateral knee replacement  . HIV Daughter   . Hypertension Daughter   . Drug abuse Daughter   . Hypertension Daughter   . Drug abuse Daughter     History  Substance Use Topics  . Smoking status: Former Smoker    Quit date: 06/16/1968  . Smokeless tobacco: Never Used  . Alcohol Use: No      Review of Systems  All other systems reviewed and are negative.    Allergies  Doxycycline and Tamsulosin  Home Medications   Current Outpatient Rx  Name  Route  Sig  Dispense  Refill  . AMLODIPINE BESYLATE 10 MG PO TABS   Oral   Take 1 tablet (10 mg total) by mouth daily.   30 tablet   6   . AMOXICILLIN 500 MG PO CAPS   Oral   Take 1 capsule (500 mg total) by mouth 4 (four) times  daily.   28 capsule   0   . ASPIRIN 81 MG PO TABS   Oral   Take 1 tablet (81 mg total) by mouth daily.   30 tablet   90   . CALCIUM CITRATE-VITAMIN D 315-200 MG-UNIT PO TABS   Oral   Take 2 tablets by mouth 2 (two) times daily.   100 tablet   3   . CARVEDILOL 12.5 MG PO TABS   Oral   Take 1 tablet (12.5 mg total) by mouth 2 (two) times daily with a meal.   60 tablet   6   . OMEGA-3 FATTY ACIDS 1000 MG PO CAPS   Oral   Take 1 capsule (1 g total) by mouth 2 (two) times daily.   180 capsule   3   . FLUTICASONE PROPIONATE 50 MCG/ACT NA SUSP   Nasal   Place 2 sprays into the nose daily.   16 g   11   . HYDROCODONE-ACETAMINOPHEN 5-325 MG PO TABS   Oral   Take 1 tablet by mouth every 4 (four) hours as needed for pain.   20 tablet   0   .  INSULIN ASPART PROT & ASPART (70-30) 100 UNIT/ML [place] SUSP      Inject 40 units in the morning and 30 units in the evening Inject 40 units in the morning and 30 units in the evening   10 mL   20     Please dispense insulin pens not vials.   . INSULIN PEN NEEDLE 30G X 8 MM MISC      Inject SQ as directed twice daily, use new pen needle for each injection   2 each   11   . LISINOPRIL 10 MG PO TABS   Oral   Take 1 tablet (10 mg total) by mouth daily.   90 tablet   3   . PRAVASTATIN SODIUM 20 MG PO TABS   Oral   Take 1 tablet (20 mg total) by mouth daily.   90 tablet   3   . TERAZOSIN HCL 10 MG PO CAPS   Oral   Take 1 capsule (10 mg total) by mouth at bedtime.   30 capsule   3     BP 157/74  Pulse 70  Temp 98.1 F (36.7 C) (Oral)  Resp 18  SpO2 100%  Physical Exam  Constitutional: He appears well-developed and well-nourished. No distress.  HENT:  Mouth/Throat: Oropharynx is clear and moist.       See history of present illness for dental prescription.  Pulmonary/Chest: Effort normal.  Skin: Skin is warm and dry.  Psychiatric: He has a normal mood and affect.    ED Course  Procedures (including critical  care time)  Labs Reviewed - No data to display No results found.   1. Toothache   2. Caries involving multiple surfaces of tooth       MDM  Amoxicillin 500 mg 4 times a day for 7 day Norco 5 mg one every 4 hours when necessary pain  The time dentist as soon as possible information for available dentist given to the patient. Also followup with your PCP needing additional medications.        Hayden Rasmussen, NP 04/09/12 1042

## 2012-04-20 ENCOUNTER — Inpatient Hospital Stay (HOSPITAL_COMMUNITY)
Admission: AD | Admit: 2012-04-20 | Discharge: 2012-04-22 | DRG: 728 | Disposition: A | Discharge status: Home / Self Care | Payer: Medicare PPO | Source: Ambulatory Visit | Attending: Internal Medicine | Admitting: Internal Medicine

## 2012-04-20 ENCOUNTER — Inpatient Hospital Stay (HOSPITAL_COMMUNITY): Payer: Medicare PPO

## 2012-04-20 ENCOUNTER — Ambulatory Visit (INDEPENDENT_AMBULATORY_CARE_PROVIDER_SITE_OTHER): Payer: Medicare Other | Admitting: Internal Medicine

## 2012-04-20 ENCOUNTER — Encounter: Payer: Self-pay | Admitting: Internal Medicine

## 2012-04-20 VITALS — BP 132/69 | HR 82 | Temp 97.2°F | Resp 20 | Ht 67.0 in | Wt 212.6 lb

## 2012-04-20 DIAGNOSIS — E785 Hyperlipidemia, unspecified: Secondary | ICD-10-CM

## 2012-04-20 DIAGNOSIS — E1142 Type 2 diabetes mellitus with diabetic polyneuropathy: Secondary | ICD-10-CM

## 2012-04-20 DIAGNOSIS — N189 Chronic kidney disease, unspecified: Secondary | ICD-10-CM

## 2012-04-20 DIAGNOSIS — Z794 Long term (current) use of insulin: Secondary | ICD-10-CM

## 2012-04-20 DIAGNOSIS — E1149 Type 2 diabetes mellitus with other diabetic neurological complication: Secondary | ICD-10-CM

## 2012-04-20 DIAGNOSIS — N452 Orchitis: Principal | ICD-10-CM | POA: Diagnosis present

## 2012-04-20 DIAGNOSIS — N451 Epididymitis: Secondary | ICD-10-CM | POA: Diagnosis present

## 2012-04-20 DIAGNOSIS — Z87891 Personal history of nicotine dependence: Secondary | ICD-10-CM

## 2012-04-20 DIAGNOSIS — N179 Acute kidney failure, unspecified: Secondary | ICD-10-CM | POA: Diagnosis present

## 2012-04-20 DIAGNOSIS — D509 Iron deficiency anemia, unspecified: Secondary | ICD-10-CM

## 2012-04-20 DIAGNOSIS — Z9861 Coronary angioplasty status: Secondary | ICD-10-CM

## 2012-04-20 DIAGNOSIS — I2510 Atherosclerotic heart disease of native coronary artery without angina pectoris: Secondary | ICD-10-CM | POA: Diagnosis present

## 2012-04-20 DIAGNOSIS — A499 Bacterial infection, unspecified: Secondary | ICD-10-CM | POA: Diagnosis present

## 2012-04-20 DIAGNOSIS — I129 Hypertensive chronic kidney disease with stage 1 through stage 4 chronic kidney disease, or unspecified chronic kidney disease: Secondary | ICD-10-CM | POA: Diagnosis present

## 2012-04-20 DIAGNOSIS — N400 Enlarged prostate without lower urinary tract symptoms: Secondary | ICD-10-CM

## 2012-04-20 DIAGNOSIS — N390 Urinary tract infection, site not specified: Secondary | ICD-10-CM

## 2012-04-20 DIAGNOSIS — E119 Type 2 diabetes mellitus without complications: Secondary | ICD-10-CM | POA: Diagnosis present

## 2012-04-20 DIAGNOSIS — Z79899 Other long term (current) drug therapy: Secondary | ICD-10-CM

## 2012-04-20 DIAGNOSIS — B964 Proteus (mirabilis) (morganii) as the cause of diseases classified elsewhere: Secondary | ICD-10-CM | POA: Diagnosis present

## 2012-04-20 DIAGNOSIS — N410 Acute prostatitis: Secondary | ICD-10-CM | POA: Diagnosis present

## 2012-04-20 DIAGNOSIS — R300 Dysuria: Secondary | ICD-10-CM

## 2012-04-20 DIAGNOSIS — Z0000 Encounter for general adult medical examination without abnormal findings: Secondary | ICD-10-CM

## 2012-04-20 DIAGNOSIS — Z7982 Long term (current) use of aspirin: Secondary | ICD-10-CM

## 2012-04-20 DIAGNOSIS — N401 Enlarged prostate with lower urinary tract symptoms: Secondary | ICD-10-CM

## 2012-04-20 DIAGNOSIS — N5089 Other specified disorders of the male genital organs: Secondary | ICD-10-CM | POA: Diagnosis present

## 2012-04-20 DIAGNOSIS — I25118 Atherosclerotic heart disease of native coronary artery with other forms of angina pectoris: Secondary | ICD-10-CM | POA: Diagnosis present

## 2012-04-20 DIAGNOSIS — N183 Chronic kidney disease, stage 3 unspecified: Secondary | ICD-10-CM | POA: Diagnosis present

## 2012-04-20 DIAGNOSIS — S78119A Complete traumatic amputation at level between unspecified hip and knee, initial encounter: Secondary | ICD-10-CM

## 2012-04-20 DIAGNOSIS — Z8673 Personal history of transient ischemic attack (TIA), and cerebral infarction without residual deficits: Secondary | ICD-10-CM

## 2012-04-20 DIAGNOSIS — B9689 Other specified bacterial agents as the cause of diseases classified elsewhere: Secondary | ICD-10-CM | POA: Diagnosis present

## 2012-04-20 DIAGNOSIS — IMO0002 Reserved for concepts with insufficient information to code with codable children: Secondary | ICD-10-CM

## 2012-04-20 DIAGNOSIS — N3000 Acute cystitis without hematuria: Secondary | ICD-10-CM | POA: Diagnosis present

## 2012-04-20 DIAGNOSIS — I10 Essential (primary) hypertension: Secondary | ICD-10-CM | POA: Diagnosis present

## 2012-04-20 LAB — URINE MICROSCOPIC-ADD ON

## 2012-04-20 LAB — URINALYSIS, ROUTINE W REFLEX MICROSCOPIC
Glucose, UA: NEGATIVE mg/dL
Ketones, ur: NEGATIVE mg/dL
pH: 6 (ref 5.0–8.0)

## 2012-04-20 LAB — COMPREHENSIVE METABOLIC PANEL
Alkaline Phosphatase: 156 U/L — ABNORMAL HIGH (ref 39–117)
BUN: 53 mg/dL — ABNORMAL HIGH (ref 6–23)
CO2: 18 mEq/L — ABNORMAL LOW (ref 19–32)
Chloride: 105 mEq/L (ref 96–112)
Creatinine, Ser: 1.99 mg/dL — ABNORMAL HIGH (ref 0.50–1.35)
GFR calc Af Amer: 38 mL/min — ABNORMAL LOW (ref >90)
GFR calc non Af Amer: 33 mL/min — ABNORMAL LOW (ref >90)
Glucose, Bld: 97 mg/dL (ref 70–99)
Potassium: 4.7 mEq/L (ref 3.5–5.1)
Total Bilirubin: 0.6 mg/dL (ref 0.3–1.2)

## 2012-04-20 LAB — POCT URINALYSIS DIPSTICK
Glucose, UA: NEGATIVE
Nitrite, UA: NEGATIVE
Urobilinogen, UA: 1

## 2012-04-20 LAB — CBC WITH DIFFERENTIAL/PLATELET
Basophils Absolute: 0 10*3/uL (ref 0.0–0.1)
Eosinophils Relative: 0 % (ref 0–5)
Lymphocytes Relative: 13 % (ref 12–46)
MCV: 70.6 fL — ABNORMAL LOW (ref 78.0–100.0)
Neutro Abs: 13.2 10*3/uL — ABNORMAL HIGH (ref 1.7–7.7)
Neutrophils Relative %: 77 % (ref 43–77)
Platelets: 169 10*3/uL (ref 150–400)
RDW: 15.2 % (ref 11.5–15.5)
WBC: 17.2 10*3/uL — ABNORMAL HIGH (ref 4.0–10.5)

## 2012-04-20 LAB — POCT GLYCOSYLATED HEMOGLOBIN (HGB A1C): Hemoglobin A1C: 6.2

## 2012-04-20 LAB — LACTIC ACID, PLASMA: Lactic Acid, Venous: 0.8 mmol/L (ref 0.5–2.2)

## 2012-04-20 LAB — GLUCOSE, CAPILLARY: Glucose-Capillary: 104 mg/dL — ABNORMAL HIGH (ref 70–99)

## 2012-04-20 MED ORDER — AZTREONAM 1 G IJ SOLR
500.0000 mg | Freq: Three times a day (TID) | INTRAMUSCULAR | Status: DC
  Administered 2012-04-20 – 2012-04-22 (×6): 500 mg via INTRAVENOUS
  Filled 2012-04-20 (×10): qty 0.5

## 2012-04-20 MED ORDER — OMEGA-3-ACID ETHYL ESTERS 1 G PO CAPS
1.0000 g | ORAL_CAPSULE | Freq: Two times a day (BID) | ORAL | Status: DC
  Administered 2012-04-20 – 2012-04-22 (×5): 1 g via ORAL
  Filled 2012-04-20 (×6): qty 1

## 2012-04-20 MED ORDER — ASPIRIN EC 81 MG PO TBEC
81.0000 mg | DELAYED_RELEASE_TABLET | Freq: Every day | ORAL | Status: DC
  Administered 2012-04-20 – 2012-04-22 (×3): 81 mg via ORAL
  Filled 2012-04-20 (×3): qty 1

## 2012-04-20 MED ORDER — CALCIUM CITRATE-VITAMIN D 315-200 MG-UNIT PO TABS: 2.0000 | ORAL_TABLET | Freq: Two times a day (BID) | ORAL | Status: DC

## 2012-04-20 MED ORDER — SIMVASTATIN 10 MG PO TABS
10.0000 mg | ORAL_TABLET | Freq: Every day | ORAL | Status: DC
  Administered 2012-04-20 – 2012-04-21 (×2): 10 mg via ORAL
  Filled 2012-04-20 (×3): qty 1

## 2012-04-20 MED ORDER — MORPHINE SULFATE 2 MG/ML IJ SOLN: 1.0000 mg | INTRAMUSCULAR | Status: DC | PRN

## 2012-04-20 MED ORDER — FLUTICASONE PROPIONATE 50 MCG/ACT NA SUSP
2.0000 | Freq: Every day | NASAL | Status: DC
  Administered 2012-04-20 – 2012-04-22 (×3): 2 via NASAL
  Filled 2012-04-20: qty 16

## 2012-04-20 MED ORDER — LISINOPRIL 10 MG PO TABS
10.0000 mg | ORAL_TABLET | Freq: Every day | ORAL | Status: DC
  Administered 2012-04-20: 10 mg via ORAL
  Filled 2012-04-20: qty 1

## 2012-04-20 MED ORDER — HEPARIN SODIUM (PORCINE) 5000 UNIT/ML IJ SOLN
5000.0000 [IU] | Freq: Three times a day (TID) | INTRAMUSCULAR | Status: DC
  Administered 2012-04-20 – 2012-04-22 (×6): 5000 [IU] via SUBCUTANEOUS
  Filled 2012-04-20 (×9): qty 1

## 2012-04-20 MED ORDER — ACETAMINOPHEN 325 MG PO TABS
650.0000 mg | ORAL_TABLET | Freq: Four times a day (QID) | ORAL | Status: DC | PRN
  Administered 2012-04-20 – 2012-04-21 (×2): 650 mg via ORAL
  Filled 2012-04-20: qty 2

## 2012-04-20 MED ORDER — TERAZOSIN HCL 5 MG PO CAPS
10.0000 mg | ORAL_CAPSULE | Freq: Every day | ORAL | Status: DC
  Administered 2012-04-20 – 2012-04-21 (×2): 10 mg via ORAL
  Filled 2012-04-20 (×3): qty 2

## 2012-04-20 MED ORDER — ASPIRIN 81 MG PO TABS: 81.0000 mg | ORAL_TABLET | Freq: Every day | ORAL | Status: DC

## 2012-04-20 MED ORDER — AMLODIPINE BESYLATE 10 MG PO TABS
10.0000 mg | ORAL_TABLET | Freq: Every day | ORAL | Status: DC
  Administered 2012-04-20 – 2012-04-22 (×3): 10 mg via ORAL
  Filled 2012-04-20 (×3): qty 1

## 2012-04-20 MED ORDER — LEVOFLOXACIN IN D5W 500 MG/100ML IV SOLN
750.0000 mg | INTRAVENOUS | Status: DC
  Administered 2012-04-20: 750 mg via INTRAVENOUS
  Filled 2012-04-20 (×2): qty 200

## 2012-04-20 MED ORDER — CALCIUM CARBONATE-VITAMIN D 500-200 MG-UNIT PO TABS
2.0000 | ORAL_TABLET | Freq: Two times a day (BID) | ORAL | Status: DC
  Administered 2012-04-20 – 2012-04-22 (×5): 2 via ORAL
  Filled 2012-04-20 (×6): qty 2

## 2012-04-20 MED ORDER — VANCOMYCIN HCL 10 G IV SOLR
1250.0000 mg | INTRAVENOUS | Status: DC
  Administered 2012-04-20: 1250 mg via INTRAVENOUS
  Filled 2012-04-20: qty 1250

## 2012-04-20 MED ORDER — SODIUM CHLORIDE 0.9 % IV SOLN
INTRAVENOUS | Status: AC
  Administered 2012-04-21: 09:00:00 via INTRAVENOUS

## 2012-04-20 MED ORDER — INSULIN ASPART PROT & ASPART (70-30 MIX) 100 UNIT/ML ~~LOC~~ SUSP
15.0000 [IU] | Freq: Two times a day (BID) | SUBCUTANEOUS | Status: DC
  Administered 2012-04-20 – 2012-04-22 (×4): 15 [IU] via SUBCUTANEOUS
  Filled 2012-04-20: qty 10

## 2012-04-20 MED ORDER — OMEGA-3 FATTY ACIDS 1000 MG PO CAPS: 1.0000 g | ORAL_CAPSULE | Freq: Two times a day (BID) | ORAL | Status: DC

## 2012-04-20 MED ORDER — CARVEDILOL 12.5 MG PO TABS
12.5000 mg | ORAL_TABLET | Freq: Two times a day (BID) | ORAL | Status: DC
  Administered 2012-04-20 – 2012-04-22 (×4): 12.5 mg via ORAL
  Filled 2012-04-20 (×6): qty 1

## 2012-04-20 MED ORDER — SODIUM CHLORIDE 0.9 % IV BOLUS (SEPSIS)
1000.0000 mL | Freq: Once | INTRAVENOUS | Status: AC
  Administered 2012-04-20: 1000 mL via INTRAVENOUS

## 2012-04-20 NOTE — Progress Notes (Signed)
Subjective:   Patient ID: [name] [name] male   DOB: 1945/07/20 67 y.o.   MRN: [id number]  HPI: [name] [name] is a 67 y.o. man with a past medical history of type 2 diabetes, hypertension, CKD, and CAD status post 2 stents presents to clinic for acute symptoms of dysuria. Pt had been recently evaluated at the ED on 04/09/12 for dental pain and given a 7 day course of Amoxicillin. Patient is currently taking antibiotics for about 2 days and then had large puffy eyes and some abdominal pain and therefore thought he was having a reaction and stopped taking the medication. Soon after that time he noticed gross hematuria, mucus discharge (having to pull out large mucous strands), bilateral back pain radiating into his scrotum, and increased frequency from his baseline. He described dysuria at the beginning of the stream and only dribbles of urine being unable to completely empty his bladder. For the past 2 days he had an acute swelling of his left testicle that he describes is now the size of a lemon and very tender. He does describe some subjective fevers and chills and had markedly decreased by mouth intake for the past week but most significantly over the past 2 days with a tight only 2 cups of noodles. Although he has not described any nausea or vomiting or diarrhea. He does have a urologist Dr. Ellyn Hack but has not been able to see him in quite some time. Sexual history was not asked during the interview. Otherwise patient has been compliant with all his other medications. 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    Past Medical History  Diagnosis Date  . Anemia     Microcytic  . Allergy     Spring and Summer  . Arthritis     Bilateral hands  . Diabetes mellitus without complication   . Hyperlipidemia   . Hypertension   . Chronic kidney disease    Current Outpatient Prescriptions  Medication Sig Dispense Refill  . amLODipine (NORVASC) 10 MG  tablet Take 1 tablet (10 mg total) by mouth daily.  30 tablet  6  . amoxicillin (AMOXIL) 500 MG capsule Take 1 capsule (500 mg total) by mouth 4 (four) times daily.  28 capsule  0  . aspirin 81 MG tablet Take 1 tablet (81 mg total) by mouth daily.  30 tablet  90  . calcium citrate-vitamin D (CITRACAL+D) 315-200 MG-UNIT per tablet Take 2 tablets by mouth 2 (two) times daily.  100 tablet  3  . carvedilol (COREG) 12.5 MG tablet Take 1 tablet (12.5 mg total) by mouth 2 (two) times daily with a meal.  60 tablet  6  . fish oil-omega-3 fatty acids 1000 MG capsule Take 1 capsule (1 g total) by mouth 2 (two) times daily.  180 capsule  3  . fluticasone (FLONASE) 50 MCG/ACT nasal spray Place 2 sprays into the nose daily.  16 g  11  . HYDROcodone-acetaminophen (NORCO/VICODIN) 5-325 MG per tablet Take 1 tablet by mouth every 4 (four) hours as needed for pain.  20 tablet  0  . insulin aspart protamine-insulin aspart (NOVOLOG 70/30) (70-30) 100 UNIT/ML injection Inject 40 units in the morning and 30 units in the evening Inject 40 units in the morning and 30 units in the evening  10 mL  20  . Insulin Pen Needle (NOVOFINE) 30G X 8 MM MISC Inject SQ as directed twice daily, use new pen  needle for each injection  2 each  11  . lisinopril (PRINIVIL,ZESTRIL) 10 MG tablet Take 1 tablet (10 mg total) by mouth daily.  90 tablet  3  . pravastatin (PRAVACHOL) 20 MG tablet Take 1 tablet (20 mg total) by mouth daily.  90 tablet  3  . terazosin (HYTRIN) 10 MG capsule Take 1 capsule (10 mg total) by mouth at bedtime.  30 capsule  3   Family History  Problem Relation Age of Onset  . Hypertension Mother   . Heart attack Father   . Breast cancer Sister   . Arthritis Sister   . Heart attack Brother   . Diabetes Brother   . Stroke Brother   . Pneumonia Daughter   . Diabetes Brother   . Alcoholism Brother   . Diabetes Brother   . Arthritis Sister     Bilateral knee replacement  . HIV Daughter   . Hypertension Daughter     . Drug abuse Daughter   . Hypertension Daughter   . Drug abuse Daughter    History   Social History  . Marital Status: Married    Spouse Name: N/A    Number of Children: N/A  . Years of Education: N/A   Social History Main Topics  . Smoking status: Former Smoker    Quit date: 06/16/1968  . Smokeless tobacco: Never Used  . Alcohol Use: No  . Drug Use: No  . Sexually Active: Yes    Birth Control/ Protection: None   Other Topics Concern  . Not on file   Social History Narrative  . No narrative on file   Review of Systems: otherwise negative unless listed in HPI  Objective:  Physical Exam: There were no vitals filed for this visit. General: resting in bed HEENT: PERRL, EOMI, no scleral icterus Cardiac: RRR, no rubs, murmurs or gallops Pulm: clear to auscultation bilaterally, moving normal volumes of air Abd: soft, nontender, nondistended, BS present Ext: warm and well perfused, no pedal edema GU: penis foreskin, no gross discharge expressed, right testicle ntttp, left testicle exquisitely tender to touch, unable to appreciate full volume as pt having trouble tolerating exam, unable to perform cremasteric muscle reflux as pt not able to fully stand, possible 1cm mobile left inguinal lymphnode, bilateral CVA Neuro: alert and oriented X3, cranial nerves II-XII grossly intact  Assessment & Plan:  1. Acute scrotum: unable to do cremasteric reflux as pt not able to tolerate therefore unable to r/o testicular torsion although less likely. Urine dip mod Hgb and leukocytes.  -UA and UCx pending -Will need testicular US -Possible urology consult -IV antibiotics

## 2012-04-20 NOTE — Progress Notes (Signed)
Patient states that he take 30 units of Humalog 70/30 at night and 40 units of Humalog in the morning before breakfast.

## 2012-04-20 NOTE — Progress Notes (Addendum)
MD notified about patients home regimen of Humalog 70/30. Patient states he takes 40 units of Humalog in the morning, and 30 units in the evening at bedtime.   MD also notified that the patient is not currently on sugar checks. MD said ok, and that he would look at it.   Thank you, Nursing

## 2012-04-20 NOTE — Consult Note (Addendum)
ANTIBIOTIC CONSULT NOTE - INITIAL  Pharmacy Consult for Vancomycin, Primaxin Indication: R/O Epididymitis  Hospital Problems: Principal Problem:  *Testicular swelling Active Problems:  Hyperlipidemia LDL goal < 100  Essential hypertension  Coronary artery disease   Allergies: Allergies  Allergen Reactions  . Doxycycline     Questionable drug rxn rash  . Tamsulosin     REACTION: dry throat, sweating, blurred vision   Amoxicillin w/abd pain & puffy eyes                    Patient Measurements: Height: 5\' 9"  (175.3 cm) (Estimated) Weight: 197 lb 6.4 oz (89.54 kg) IBW/kg (Calculated) : 70.7   Labs:  Basename 04/20/12 1238  WBC 17.2*  HGB 10.4*  PLT 169  LABCREA --  CREATININE 1.99*   Estimated Creatinine Clearance: 39.3 ml/min (by C-G formula based on Cr of 1.99).   Microbiology: No results found for this or any previous visit (from the past 720 hour(s)).  Medical/Surgical History: Past Medical History  Diagnosis Date  . Anemia     Microcytic  . Allergy     Spring and Summer  . Arthritis     Bilateral hands  . Diabetes mellitus without complication   . Hyperlipidemia   . Hypertension   . Chronic kidney disease    Past Surgical History  Procedure Date  . Pilonidal cyst excision   . Eye surgery     Laser  . Fracture surgery     Fractured stump    Medications:  Prescriptions prior to admission  Medication Sig Dispense Refill  . amLODipine (NORVASC) 10 MG tablet Take 10 mg by mouth every morning.      [name] aspirin EC 81 MG tablet Take 81 mg by mouth every morning.      . calcium citrate-vitamin D (CITRACAL+D) 315-200 MG-UNIT per tablet Take 2 tablets by mouth 2 (two) times daily.      . carvedilol (COREG) 12.5 MG tablet Take 1 tablet (12.5 mg total) by mouth 2 (two) times daily with a meal.  60 tablet  6  . fish oil-omega-3 fatty acids 1000 MG capsule Take 1 capsule (1 g total) by mouth 2 (two) times daily.  180 capsule  3  . fluticasone (FLONASE) 50  MCG/ACT nasal spray Place 2 sprays into the nose daily.  16 g  11  . HYDROcodone-acetaminophen (NORCO/VICODIN) 5-325 MG per tablet Take 1 tablet by mouth every 4 (four) hours as needed. For pain      . insulin aspart protamine-insulin aspart (NOVOLOG 70/30) (70-30) 100 UNIT/ML injection Inject 30-40 Units into the skin 2 (two) times daily with a meal. 40 units in the morning; 30 units in the evening      . lisinopril (PRINIVIL,ZESTRIL) 10 MG tablet Take 10 mg by mouth every morning.      . pravastatin (PRAVACHOL) 20 MG tablet Take 20 mg by mouth every morning.      . terazosin (HYTRIN) 10 MG capsule Take 10 mg by mouth at bedtime.      . Insulin Pen Needle (NOVOFINE) 30G X 8 MM MISC Inject SQ as directed twice daily, use new pen needle for each injection  2 each  11   Scheduled:    . amLODipine  10 mg Oral Daily  . aspirin EC  81 mg Oral Daily  . calcium-vitamin D  2 tablet Oral BID  . carvedilol  12.5 mg Oral BID WC  . fluticasone  2 spray Each Nare  Daily  . heparin  5,000 Units Subcutaneous Q8H  . insulin aspart protamine-insulin aspart  15 Units Subcutaneous BID WC  . omega-3 acid ethyl esters  1 g Oral BID  . simvastatin  10 mg Oral q1800  . sodium chloride  1,000 mL Intravenous Once  . terazosin  10 mg Oral QHS  . [DISCONTINUED] aspirin  81 mg Oral Daily  . [DISCONTINUED] calcium citrate-vitamin D  2 tablet Oral BID  . [DISCONTINUED] fish oil-omega-3 fatty acids  1 g Oral BID  . [DISCONTINUED] lisinopril  10 mg Oral Daily   Anti-infectives    None      Assessment:  67 y/o male with an acute scrotum.  R/O epdidymitis who is placed on empiric Vancomycin and Primaxin.  Goal of Therapy:   Vancomycin trough ~ 15 mcg/ml  Renal adjustment of Primaxin for CrCl < 40 ml/min.  Plan:   Begin Vancomycin 1250 mg IV q 24 hours.  Begin Primaxin 250 mg IV q 8 hours.  Follow up Vancomycin levels as indicated, cultures.  Navdeep Fessenden, Elisha Headland, Pharm.D. 1/28/20143:18 PM  ADDENDUM:   Patient has had a recent reaction to Amoxicillin with abdominal pain and puffy eyes.  As this was an oral preparation, will consider alternative agent without the potential for cross-reactivity.      Medical team to be consulted.   3:30 PM   Ivet Guerrieri, Elisha Headland,  Pharm.D.

## 2012-04-20 NOTE — H&P (Signed)
Hospital Admission Note Date: 04/21/2012  Patient name: [name] [name] Medical record number: [id number] Date of birth: 1945/09/02 Age: 67 y.o. Gender: male PCP: Rocco Serene, MD  Medical Service: Internal Medicine Teaching Service  Attending physician:  Dr. Kem Kays    1st Contact:  Dr. Lavena Bullion   Pager: [phone] 2nd Contact:  Dr. Dorise Hiss   Pager: [phone] After 5 pm or weekends: 1st Contact:      Pager: [phone] 2nd Contact:      Pager: [phone]  Chief Complaint: testicular swelling and mucus urine  History of Present Illness: [name] [name] is a 67 yo M with past medical history significant for diabetes, CAD status post stents, TIAs, HTN, HLP, CKD (baseline Cr 1.4-1.6), BPH (followed by Dr. Brunilda Payor with [facility]) presents to clinic for worsening left testicular enlargement x 3 days in duration. He states that he has been having urinary symptoms including dysuria and burning sensation along with mucus with urine production. He also has bilateral flank flank pain is worse on the left side. He had a subjective fever on Sunday but did not measure his temperature. He has problem with starting his urination stream. He states that his urine was orange to red in color from the beginning to the end of his stream but now resolved. He has increasing urinary frequency about  5 times during the daytime and 6-7 times at night.  Of note, he states that he had a similar episode happened about 2 years ago.   He denies any penile discharge, nausea vomiting, chest pain, shortness of breath, or diarrhea .  On January 17, he went to the urgent care for his today and was prescribed amoxicillin. He had an allergic reaction to amoxicillin which made his eyes swollen and had nasal drainage in his throat. He states that the amoxicillin did help improve his urinary symptoms but it  returns as soon as he stopped the amoxicillin. He has decreased appetite in the past several days.  Meds: Prescriptions prior to admission    Medication Sig Dispense Refill  . amLODipine (NORVASC) 10 MG tablet Take 10 mg by mouth every morning.      [name] aspirin EC 81 MG tablet Take 81 mg by mouth every morning.      . calcium citrate-vitamin D (CITRACAL+D) 315-200 MG-UNIT per tablet Take 2 tablets by mouth 2 (two) times daily.      . carvedilol (COREG) 12.5 MG tablet Take 1 tablet (12.5 mg total) by mouth 2 (two) times daily with a meal.  60 tablet  6  . fish oil-omega-3 fatty acids 1000 MG capsule Take 1 capsule (1 g total) by mouth 2 (two) times daily.  180 capsule  3  . fluticasone (FLONASE) 50 MCG/ACT nasal spray Place 2 sprays into the nose daily.  16 g  11  . HYDROcodone-acetaminophen (NORCO/VICODIN) 5-325 MG per tablet Take 1 tablet by mouth every 4 (four) hours as needed. For pain      . insulin aspart protamine-insulin aspart (NOVOLOG 70/30) (70-30) 100 UNIT/ML injection Inject 30-40 Units into the skin 2 (two) times daily with a meal. 40 units in the morning; 30 units in the evening      . lisinopril (PRINIVIL,ZESTRIL) 10 MG tablet Take 10 mg by mouth every morning.      . pravastatin (PRAVACHOL) 20 MG tablet Take 20 mg by mouth every morning.      . terazosin (HYTRIN) 10 MG capsule Take 10 mg by mouth at bedtime.      [name]  Insulin Pen Needle (NOVOFINE) 30G X 8 MM MISC Inject SQ as directed twice daily, use new pen needle for each injection  2 each  11    Allergies: Allergies as of 04/20/2012 - Review Complete 04/20/2012  Allergen Reaction Noted  . Doxycycline  12/02/2011  . Tamsulosin     Past Medical History  Diagnosis Date  . Anemia     Microcytic  . Allergy     Spring and Summer  . Arthritis     Bilateral hands  . Diabetes mellitus without complication   . Hyperlipidemia   . Hypertension   . Chronic kidney disease    Past Surgical History  Procedure Date  . Pilonidal cyst excision   . Eye surgery     Laser  . Fracture surgery     Fractured stump   Family History  Problem Relation Age of Onset  .  Hypertension Mother   . Heart attack Father   . Breast cancer Sister   . Arthritis Sister   . Heart attack Brother   . Diabetes Brother   . Stroke Brother   . Pneumonia Daughter   . Diabetes Brother   . Alcoholism Brother   . Diabetes Brother   . Arthritis Sister     Bilateral knee replacement  . HIV Daughter   . Hypertension Daughter   . Drug abuse Daughter   . Hypertension Daughter   . Drug abuse Daughter    History   Social History  . Marital Status: Married    Spouse Name: N/A    Number of Children: N/A  . Years of Education: N/A   Occupational History  . Not on file.   Social History Main Topics  . Smoking status: Former Smoker    Quit date: 06/16/1968  . Smokeless tobacco: Never Used  . Alcohol Use: No--quit long time ago  . Drug Use: No- quit long time ago  . Sexually Active: Yes    Birth Control/ Protection: None    Review of Systems: Constitutional: + fever, +chills, diaphoresis, +appetite change and fatigue.  HEENT: Denies photophobia, eye pain, redness, hearing loss, ear pain, congestion, sore throat, rhinorrhea, sneezing, mouth sores, trouble swallowing, neck pain, neck stiffness and tinnitus.  Respiratory: Denies SOB, DOE, cough, chest tightness, and wheezing.  Cardiovascular: Denies chest pain, palpitations and leg swelling.  Gastrointestinal: Denies nausea, vomiting, abdominal pain, diarrhea, constipation, blood in stool and abdominal distention.  Genitourinary: + dysuria, urgency,+ frequency, +hematuria, +flank pain and +difficulty urinating.  Musculoskeletal: Denies myalgias, back pain, joint swelling, arthralgias and gait problem.  Skin: Denies pallor, rash and wound.  Neurological: Denies dizziness, seizures, syncope, weakness, light-headedness, numbness and headaches.  Hematological: Denies adenopathy. Easy bruising, personal or family bleeding history  Psychiatric/Behavioral: Denies suicidal ideation, mood changes, confusion, nervousness, sleep  disturbance and agitation  Physical Exam: Blood pressure 132/64, pulse 73, temperature 98.5 F (36.9 C), temperature source Oral, resp. rate 18, height 5\' 9"  (1.753 m), weight 206 lb (93.441 kg), SpO2 100.00%. General: alert, well-developed, and cooperative to examination.  Head: normocephalic and atraumatic.  Eyes: vision grossly intact, pupils equal, pupils round, pupils reactive to light, no injection and anicteric.  Mouth: pharynx pink and moist, no erythema, and no exudates.  Neck: supple, full ROM, no thyromegaly, no JVD, and no carotid bruits.  Lungs: normal respiratory effort, no accessory muscle use, normal breath sounds, no crackles, and no wheezes. Heart: normal rate, regular rhythm, no murmur, no gallop, and no rub.  Abdomen: soft,  non-tender, normal bowel sounds, no distention, no guarding, no rebound tenderness, no hepatomegaly, and no splenomegaly. +bilateral CVA tenderness L>R Msk: no joint swelling, no joint warmth, and no redness over joints.  Pulses: 2+ DP/PT pulses bilaterally Extremities: No cyanosis, clubbing, edema. Right AKA stump wearing prosthesis Neurologic: alert & oriented X3, cranial nerves II-XII intact, strength normal in all extremities, sensation intact to light touch Skin: turgor normal GU: 8-10 cm right scrotum edema, erythematous without any drainage or crepitus. Tenderness to palpation of the left scrotal area.  NO penile discharge or edema or erythema. No adenopathy in inguinal regions.   Psych: Oriented X3, memory intact for recent and remote, normally interactive, good eye contact, not anxious appearing, and not depressed appearing.  Lab results: Basic Metabolic Panel:  Basename 04/21/12 0610 04/20/12 1238  NA 138 137  K 4.6 4.7  CL 109 105  CO2 16* 18*  GLUCOSE 142* 97  BUN 49* 53*  CREATININE 1.82* 1.99*  CALCIUM 8.6 8.6  MG -- --  PHOS -- --   Liver Function Tests:  [facility] 04/20/12 1238  AST 29  ALT 20  ALKPHOS 156*  BILITOT 0.6    PROT 7.8  ALBUMIN 2.8*   CBC:  Basename 04/21/12 0610 04/20/12 1238  WBC 10.7* 17.2*  NEUTROABS -- 13.2*  HGB 9.7* 10.4*  HCT 29.5* 31.9*  MCV 70.6* 70.6*  PLT 138* 169    CBG:  Basename 04/20/12 0839  GLUCAP 104*   Hemoglobin A1C:  Basename 04/20/12 0851  HGBA1C 6.2   Urinalysis:  Basename 04/20/12 1324 04/20/12 0851  COLORURINE YELLOW --  LABSPEC 1.012 --  PHURINE 6.0 --  GLUCOSEU NEGATIVE --  HGBUR MODERATE* --  BILIRUBINUR NEGATIVE Small  KETONESUR NEGATIVE --  PROTEINUR 30* --  UROBILINOGEN 1.0 1.0  NITRITE NEGATIVE Negative  LEUKOCYTESUR LARGE* large (3+)   SCROTAL ULTRASOUND  DOPPLER ULTRASOUND OF THE TESTICLES  Technique: Complete ultrasound examination of the testicles,  epididymis, and other scrotal structures was performed. Color and  spectral Doppler ultrasound were also utilized to evaluate blood  flow to the testicles.  Comparison: CT abdomen/pelvis 06/10/2010  Findings:  Right testis: The right testis measures 4.7 x 3.1 x 3.9 cm  Left testis: The left testis measures 5.4 x 3.5 x 4.2 cm  Right epididymis: Enlarged, heterogeneous and hypervascular  epididymal tail and body.  Left epididymis: The left epididymal head appears enlarged  heterogeneous, however is not particularly hypervascular.  Hydrocele: Absent  Varicocele: There may be a very small right varicocele.  Pulsed Doppler interrogation of both testes demonstrates low  resistance flow bilaterally.  IMPRESSION:  1. Negative for testicular torsion.  2. Enlarged heterogeneous and hypervascular right epididymal body  and tail and enlarged, heterogeneous but not hypervascular left  epididymal head. Overall, the findings are most consistent with  bilateral epididymitis worse on the right than the left.  3. Small right varicocele.   RENAL/URINARY TRACT ULTRASOUND COMPLETE  Comparison: CT of the abdomen and pelvis 06/10/2010  Findings:  Right Kidney: 10.5 cm in length. Upper pole cyst  is 1.3 x 1.3 x  1.2 cm. Mid pole cyst is 1.1 x 1 by 0 x 1.3 cm. No  hydronephrosis.  Left Kidney: 11.2 cm. No mass or hydronephrosis.  Bladder: Normal in appearance. Incompletely distended following  voiding.  IMPRESSION:  1. Small benign right renal cysts.  2. No hydronephrosis.  Assessment & Plan by Problem:  1. Testicular/scrotal swelling: differential diagnosis include epididymis vs testicular torsion vs. Margaretmary Lombard  gangrene vs. acute bacterial prostatitis. Unclear etiology at this time but will need to rule out Fournier gangrene (less likely because no sign of necrotizing infection at this time such as blisters/bullae, crepitus, and subcutaneous gas) but he does have risk factor such as diabetes.  Patient does have BPH but testicular cancer is less likely because BPH is not believed to be a risk factor for prostate cancer, although studies have come to conflicting results.    -Admit to med-surg -Stat US of scrotum -U/A and urine culture, blood ctx, CXR -GC/chlamydia probe-urine to rule out infectious cause -check CBC, CMP, HIV -start empiric treatment with Vancomycin (need to dose appropriately given his CKD) and Imipenem to cover to gram Positive (MRSA) and gram negative organisms. Patient had allergic reaction to Amoxicillin.  -Consider consulting urology-Dr. Brunilda Payor once we have the ultrasound results -Pain control with Morphine PRN  2. UTI: signs and symptoms are consistent with UTI. He reports gross hematuria x several days last week which is concerning for malignancy. -Will treat with IV abx as above -Urinalysis and urine culture -Will also get abd/renal ultrasound to rule out hydronephrosis/pyelonephritis  3. HTN: well controlled, will continue all home meds unless he becomes hypotensive.  Will hold ACEi in acute setting.   4. HLP: LDL in 04/2011 was 17, currently on Pravachol 20mg . Had history of multiple TIAs,currently  on ASA.  5. CKD: baseline Cr 1.4-1.6.  On ACEi at home  but will check his CMP today.  6. DM II: well controlled. HbA1c in 03/2012 was 6.2. He takes Novolog 70/30 , 40 units in AM and 30 units in PM.   -Will decrease insulin dose to 15units BID -Carb modified diet  7. CAD s/p stents: stable, on ACEi, BB, ASA, statin.    DVT ppx: heparin sq  Dispo: Disposition is deferred at this time, awaiting improvement of current medical problems. Anticipated discharge in approximately 2-3 day(s).   The patient have a current PCP (Josem Kaufmann, Smith Mince, MD), therefore require OPC follow-up after discharge.   The patient does not have transportation limitations that hinder transportation to clinic appointments.  Signed: Kylin Genna 04/21/2012, 7:41 AM

## 2012-04-21 DIAGNOSIS — N451 Epididymitis: Secondary | ICD-10-CM | POA: Diagnosis present

## 2012-04-21 LAB — CBC
MCH: 23.2 pg — ABNORMAL LOW (ref 26.0–34.0)
MCHC: 32.9 g/dL (ref 30.0–36.0)
Platelets: 138 10*3/uL — ABNORMAL LOW (ref 150–400)
RBC: 4.18 MIL/uL — ABNORMAL LOW (ref 4.22–5.81)
RDW: 14.9 % (ref 11.5–15.5)

## 2012-04-21 LAB — BASIC METABOLIC PANEL
BUN: 49 mg/dL — ABNORMAL HIGH (ref 6–23)
Calcium: 8.6 mg/dL (ref 8.4–10.5)
Creatinine, Ser: 1.82 mg/dL — ABNORMAL HIGH (ref 0.50–1.35)
GFR calc non Af Amer: 36 mL/min — ABNORMAL LOW (ref >90)
Glucose, Bld: 142 mg/dL — ABNORMAL HIGH (ref 70–99)
Sodium: 138 mEq/L (ref 135–145)

## 2012-04-21 LAB — GLUCOSE, CAPILLARY: Glucose-Capillary: 176 mg/dL — ABNORMAL HIGH (ref 70–99)

## 2012-04-21 LAB — GC/CHLAMYDIA PROBE AMP
CT Probe RNA: NEGATIVE
GC Probe RNA: NEGATIVE

## 2012-04-21 LAB — URINALYSIS, ROUTINE W REFLEX MICROSCOPIC
Bilirubin Urine: NEGATIVE
Nitrite: POSITIVE — AB
Protein, ur: 100 mg/dL — AB
Specific Gravity, Urine: 1.017 (ref 1.005–1.030)
Urobilinogen, UA: 1 mg/dL (ref 0.0–1.0)

## 2012-04-21 LAB — URINALYSIS, MICROSCOPIC ONLY: Squamous Epithelial / LPF: NONE SEEN

## 2012-04-21 MED ORDER — HYDROCODONE-ACETAMINOPHEN 5-325 MG PO TABS: 1.0000 | ORAL_TABLET | ORAL | Status: DC | PRN

## 2012-04-21 NOTE — Progress Notes (Signed)
UR COMPLETED  

## 2012-04-21 NOTE — Progress Notes (Signed)
Subjective:    Patient states his dysuria has resolved and his testicular pain is greatly improved. Complaining of some hesitancy with urination. He has no other complaints today.   Interval Events: No acute events. Tmax = 100.9 at ~2200. Otherwise afebrile overnight.    Objective:    Vital Signs:   Temp:  [98.5 F (36.9 C)-100.9 F (38.3 C)] 98.5 F (36.9 C) (01/29 0615) Pulse Rate:  [73-84] 73  (01/29 0615) Resp:  [18-20] 18  (01/29 0615) BP: (131-150)/(58-70) 150/70 mmHg (01/29 0757) SpO2:  [98 %-100 %] 100 % (01/29 0615) Weight:  [197 lb 6.4 oz (89.54 kg)-206 lb (93.441 kg)] 206 lb (93.441 kg) (01/29 0545) Last BM Date: 04/19/12  24-hour weight change: Weight change:   Intake/Output:   Intake/Output Summary (Last 24 hours) at 04/21/12 1135 Last data filed at 04/21/12 0900  Gross per 24 hour  Intake    684 ml  Output    650 ml  Net     34 ml      Physical Exam: General: Vital signs reviewed and noted. Well-developed, well-nourished, in no acute distress; alert, appropriate and cooperative throughout examination.  Lungs:  Normal respiratory effort. Clear to auscultation BL without crackles or wheezes.  Heart: RRR. S1 and S2 normal without gallop, murmur, or rubs.  Abdomen/GU:  BS normoactive. Soft, Nondistended, non-tender. Enlargement of L epididymis. No TTP. No erythema or edema of scrotal tissue.   Extremities: No pretibial edema.     Labs:  Basic Metabolic Panel:  Lab 04/21/12 1610 04/20/12 1238  NA 138 137  K 4.6 4.7  CL 109 105  CO2 16* 18*  GLUCOSE 142* 97  BUN 49* 53*  CREATININE 1.82* 1.99*  CALCIUM 8.6 8.6  MG -- --  PHOS -- --    Liver Function Tests:  Lab 04/20/12 1238  AST 29  ALT 20  ALKPHOS 156*  BILITOT 0.6  PROT 7.8  ALBUMIN 2.8*   CBC:  Lab 04/21/12 0610 04/20/12 1238  WBC 10.7* 17.2*  NEUTROABS -- 13.2*  HGB 9.7* 10.4*  HCT 29.5* 31.9*  MCV 70.6* 70.6*  PLT 138* 169   CBG:  Lab 04/20/12 0839  GLUCAP 104*     Microbiology: Results for orders placed during the hospital encounter of 04/20/12  CULTURE, BLOOD (ROUTINE X 2)     Status: Normal (Preliminary result)   Collection Time   04/20/12 12:38 PM      Component Value Range Status Comment   Specimen Description BLOOD RIGHT ANTECUBITAL   Final    Special Requests BOTTLES DRAWN AEROBIC AND ANAEROBIC 10CC   Final    Culture  Setup Time 04/20/2012 21:55   Final    Culture     Final    Value:        BLOOD CULTURE RECEIVED NO GROWTH TO DATE CULTURE WILL BE HELD FOR 5 DAYS BEFORE ISSUING A FINAL NEGATIVE REPORT   Report Status PENDING   Incomplete   CULTURE, BLOOD (ROUTINE X 2)     Status: Normal (Preliminary result)   Collection Time   04/20/12 12:45 PM      Component Value Range Status Comment   Specimen Description BLOOD ARM RIGHT   Final    Special Requests BOTTLES DRAWN AEROBIC AND ANAEROBIC 10CC   Final    Culture  Setup Time 04/20/2012 21:55   Final    Culture     Final    Value:  BLOOD CULTURE RECEIVED NO GROWTH TO DATE CULTURE WILL BE HELD FOR 5 DAYS BEFORE ISSUING A FINAL NEGATIVE REPORT   Report Status PENDING   Incomplete   GC/CHLAMYDIA PROBE AMP     Status: Normal   Collection Time   04/20/12  6:43 PM      Component Value Range Status Comment   CT Probe RNA NEGATIVE  NEGATIVE Final    GC Probe RNA NEGATIVE  NEGATIVE Final     Imaging: US Scrotum  04/20/2012  *RADIOLOGY REPORT*  Clinical Data:  Testicular swelling  SCROTAL ULTRASOUND DOPPLER ULTRASOUND OF THE TESTICLES  Technique: Complete ultrasound examination of the testicles, epididymis, and other scrotal structures was performed.  Color and spectral Doppler ultrasound were also utilized to evaluate blood flow to the testicles.  Comparison:  CT abdomen/pelvis 06/10/2010  Findings:  Right testis:  The right testis measures 4.7 x 3.1 x 3.9 cm  Left testis:  The left testis measures 5.4 x 3.5 x 4.2 cm  Right epididymis:  Enlarged, heterogeneous and hypervascular epididymal  tail and body.  Left epididymis:  The left epididymal head appears enlarged heterogeneous, however is not particularly hypervascular.  Hydrocele:  Absent  Varicocele:  There may be a very small right varicocele.  Pulsed Doppler interrogation of both testes demonstrates low resistance flow bilaterally.  IMPRESSION:  1.  Negative for testicular torsion. 2.  Enlarged heterogeneous and hypervascular right epididymal body and tail and enlarged, heterogeneous but not hypervascular left epididymal head.  Overall, the findings are most consistent with bilateral epididymitis worse on the right than the left. 3.  Small right varicocele.   Original Report Authenticated By: Malachy Moan, M.D.    US Renal  04/20/2012  *RADIOLOGY REPORT*  Clinical Data: Urinary tract infection.  Question of pyelonephritis.  History of diabetes and hypertension.  RENAL/URINARY TRACT ULTRASOUND COMPLETE  Comparison:  CT of the abdomen and pelvis 06/10/2010  Findings:  Right Kidney:  10.5 cm in length.  Upper pole cyst is 1.3 x 1.3 x 1.2 cm.  Mid pole cyst is 1.1 x 1 by 0 x 1.3 cm.  No hydronephrosis.  Left Kidney:  11.2 cm.  No mass or hydronephrosis.  Bladder:  Normal in appearance.  Incompletely distended following voiding.  IMPRESSION:  1.  Small benign right renal cysts. 2.  No hydronephrosis.   Original Report Authenticated By: Norva Pavlov, M.D.    [place] Art/ven Flow Abd Pelv Doppler  04/20/2012  *RADIOLOGY REPORT*  Clinical Data:  Testicular swelling  SCROTAL ULTRASOUND DOPPLER ULTRASOUND OF THE TESTICLES  Technique: Complete ultrasound examination of the testicles, epididymis, and other scrotal structures was performed.  Color and spectral Doppler ultrasound were also utilized to evaluate blood flow to the testicles.  Comparison:  CT abdomen/pelvis 06/10/2010  Findings:  Right testis:  The right testis measures 4.7 x 3.1 x 3.9 cm  Left testis:  The left testis measures 5.4 x 3.5 x 4.2 cm  Right epididymis:  Enlarged, heterogeneous  and hypervascular epididymal tail and body.  Left epididymis:  The left epididymal head appears enlarged heterogeneous, however is not particularly hypervascular.  Hydrocele:  Absent  Varicocele:  There may be a very small right varicocele.  Pulsed Doppler interrogation of both testes demonstrates low resistance flow bilaterally.  IMPRESSION:  1.  Negative for testicular torsion. 2.  Enlarged heterogeneous and hypervascular right epididymal body and tail and enlarged, heterogeneous but not hypervascular left epididymal head.  Overall, the findings are most consistent with bilateral epididymitis  worse on the right than the left. 3.  Small right varicocele.   Original Report Authenticated By: Malachy Moan, M.D.        Medications:    Infusions:    Scheduled Medications:    . amLODipine  10 mg Oral Daily  . aspirin EC  81 mg Oral Daily  . aztreonam  500 mg Intravenous Q8H  . calcium-vitamin D  2 tablet Oral BID  . carvedilol  12.5 mg Oral BID WC  . fluticasone  2 spray Each Nare Daily  . heparin  5,000 Units Subcutaneous Q8H  . insulin aspart protamine-insulin aspart  15 Units Subcutaneous BID WC  . levofloxacin  750 mg Intravenous Q48H  . omega-3 acid ethyl esters  1 g Oral BID  . simvastatin  10 mg Oral q1800  . terazosin  10 mg Oral QHS    PRN Medications: acetaminophen, morphine injection   Assessment/ Plan:   Epididymitis - scrotal u/s revealed findings consistent with bilateral epididymitis. Pt is allergic to amoxicillin and doxycycline. He was started on aztreonam and levaquin. Febrile overnight to 100.9, but no fevers this AM, and patient states his testicular/scrotal swelling and discomfort is greatly improved, and his dysuria has resolved. Leukocytosis improved from 17.2 => 10.7. GC/chlamydia negative. HIV non-reactive.   - cont aztreonam, levaquin - norco PRN  UTI - signs and symptoms are consistent with UTI and possibly prostatitis in addition to epididymitis.  Hematuria resolved per pt report, and dysuria resolved, per above. He is being adequately covered for UTI/prostatitis with treatment for epididymitis per above. - cont levaquin  BPH - on terazosin, followed by Dr. Brunilda Payor. Mild retention symptoms this admission, possibly 2/2 acute bacterial prostatitis. Will avoid prostate exam while concern for acute bacterial prostatitis persists, and will treat empirically.  - check post-void bladder scan - discuss case with Dr. Brunilda Payor  HTN - well controlled, will continue all home meds unless he becomes hypotensive. Will hold ACEi in acute setting.   CKD - baseline Cr 1.4-1.6. Cr = 1.99 on admission, improved to 1.82 this AM. Renal u/s negative for hydronephrosis.   DM II - will continue 70/30 15U bid and add SSI for meal coverage. CBGs <200 since admission.   CAD s/p stents - stable, on ACEi, BB, ASA, statin.     SERVICE NEEDED AT DISCHARGE - TO BE DETERMINED DURING HOSPITAL COURSE         Y = Yes, Blank = No PT:   OT:   RN:   Equipment:   Other:      Length of Stay: 1 day(s)   Signed: Elfredia Nevins, MD  PGY-1, Internal Medicine Resident Pager: [phone] (7AM-5PM) 04/21/2012, 11:35 AM

## 2012-04-21 NOTE — Progress Notes (Signed)
Visit to patient while in hospital. Will call after he is discharged to assist with transition of care. 

## 2012-04-21 NOTE — H&P (Signed)
INTERNAL MEDICINE TEACHING SERVICE Attending Admission Note  Date: 04/21/2012  Patient name: [name] [name]  Medical record number: [id number]  Date of birth: [birth year]    I have seen and evaluated [name] [name] and discussed their care with the Residency Team.  67 year old male with a past medical history significant for chronic kidney disease stage III, hypertension, hyperlipidemia, CAD, diabetes mellitus type 2, BPH, presented due to dysuria and testicular pain.  He complains of urinary frequency, burning sensation, mucus in his urine over the previous few days. He also stated he thinks he had a fever along with this. He also states he noticed his urine turning darker in color possibly somewhat bloody. He states he's had a similar episode to this a few years ago for which she was treated with antibiotics by his urologist. He states earlier this month he went to urgent care was prescribed amoxicillin for a dental issue which may have caused some swollen eyes and a possible allergic reaction. He denies any difficulty breathing or hives after taking this medication.  He was found to have tenderness of the scrotum with concern for testicular torsion versus epididymitis versus bacterial prostatitis versus deeper tissue infection. Ultrasound of his scrotum showed no testicular torsion, but he did have heterogenous and a enlarged hypervascular right epididymal body. He also had similar findings on the left concerning for bilateral epididymitis. This morning he states he feels improved and the swelling has improved.  Physical Exam: Blood pressure 150/60, pulse 73, temperature 98.5 F (36.9 C), temperature source Oral, resp. rate 18, height 5\' 9"  (1.753 m), weight 206 lb (93.441 kg), SpO2 100.00%.  General: Vital signs reviewed and noted. Well-developed, well-nourished, in no acute distress; alert, appropriate and cooperative throughout examination.  Head: Normocephalic, atraumatic.  Eyes: PERRL,  EOMI, No signs of anemia or jaundince.  Nose: Mucous membranes moist, not inflammed, nonerythematous.  Throat: Oropharynx nonerythematous, no exudate appreciated.   Neck: No deformities, masses, or tenderness noted.Supple, No carotid Bruits, no JVD.  Lungs:  Normal respiratory effort. Clear to auscultation BL without crackles or wheezes.  Heart: RRR. S1 and S2 normal without gallop, murmur, or rubs.  Abdomen:  BS normoactive. Soft, Nondistended, non-tender.  No masses or organomegaly.  Extremities:  right above-knee amputation clean and dry.   Neurologic: A&O X3, CN II - XII are grossly intact. Motor strength is 5/5 in the all 4 extremities, Sensations intact to light touch, Cerebellar signs negative.  Urologic   tender testicular region bilaterally with some erythema and edema. No penile discharge.     Lab results: Results for orders placed during the hospital encounter of 04/20/12 (from the past 24 hour(s))  LACTIC ACID, PLASMA     Status: Normal   Collection Time   04/20/12  3:19 PM      Component Value Range   Lactic Acid, Venous 0.8  0.5 - 2.2 mmol/L  HIV ANTIBODY (ROUTINE TESTING)     Status: Normal   Collection Time   04/20/12  3:20 PM      Component Value Range   HIV NON REACTIVE  NON REACTIVE  GC/CHLAMYDIA PROBE AMP     Status: Normal   Collection Time   04/20/12  6:43 PM      Component Value Range   CT Probe RNA NEGATIVE  NEGATIVE   GC Probe RNA NEGATIVE  NEGATIVE  BASIC METABOLIC PANEL     Status: Abnormal   Collection Time   04/21/12  6:10 AM  Component Value Range   Sodium 138  135 - 145 mEq/L   Potassium 4.6  3.5 - 5.1 mEq/L   Chloride 109  96 - 112 mEq/L   CO2 16 (*) 19 - 32 mEq/L   Glucose, Bld 142 (*) 70 - 99 mg/dL   BUN 49 (*) 6 - 23 mg/dL   Creatinine, Ser 1.61 (*) 0.50 - 1.35 mg/dL   Calcium 8.6  8.4 - 09.6 mg/dL   GFR calc non Af Amer 36 (*) >90 mL/min   GFR calc Af Amer 42 (*) >90 mL/min  CBC     Status: Abnormal   Collection Time   04/21/12  6:10 AM       Component Value Range   WBC 10.7 (*) 4.0 - 10.5 K/uL   RBC 4.18 (*) 4.22 - 5.81 MIL/uL   Hemoglobin 9.7 (*) 13.0 - 17.0 g/dL   HCT 04.5 (*) 40.9 - 81.1 %   MCV 70.6 (*) 78.0 - 100.0 fL   MCH 23.2 (*) 26.0 - 34.0 pg   MCHC 32.9  30.0 - 36.0 g/dL   RDW 91.4  78.2 - 95.6 %   Platelets 138 (*) 150 - 400 K/uL  GLUCOSE, CAPILLARY     Status: Abnormal   Collection Time   04/21/12 11:40 AM      Component Value Range   Glucose-Capillary 176 (*) 70 - 99 mg/dL   Comment 1 Documented in Chart     Comment 2 Notify RN      Imaging results:  US Scrotum  04/20/2012  *RADIOLOGY REPORT*  Clinical Data:  Testicular swelling  SCROTAL ULTRASOUND DOPPLER ULTRASOUND OF THE TESTICLES  Technique: Complete ultrasound examination of the testicles, epididymis, and other scrotal structures was performed.  Color and spectral Doppler ultrasound were also utilized to evaluate blood flow to the testicles.  Comparison:  CT abdomen/pelvis 06/10/2010  Findings:  Right testis:  The right testis measures 4.7 x 3.1 x 3.9 cm  Left testis:  The left testis measures 5.4 x 3.5 x 4.2 cm  Right epididymis:  Enlarged, heterogeneous and hypervascular epididymal tail and body.  Left epididymis:  The left epididymal head appears enlarged heterogeneous, however is not particularly hypervascular.  Hydrocele:  Absent  Varicocele:  There may be a very small right varicocele.  Pulsed Doppler interrogation of both testes demonstrates low resistance flow bilaterally.  IMPRESSION:  1.  Negative for testicular torsion. 2.  Enlarged heterogeneous and hypervascular right epididymal body and tail and enlarged, heterogeneous but not hypervascular left epididymal head.  Overall, the findings are most consistent with bilateral epididymitis worse on the right than the left. 3.  Small right varicocele.   Original Report Authenticated By: Malachy Moan, M.D.    US Renal  04/20/2012  *RADIOLOGY REPORT*  Clinical Data: Urinary tract infection.   Question of pyelonephritis.  History of diabetes and hypertension.  RENAL/URINARY TRACT ULTRASOUND COMPLETE  Comparison:  CT of the abdomen and pelvis 06/10/2010  Findings:  Right Kidney:  10.5 cm in length.  Upper pole cyst is 1.3 x 1.3 x 1.2 cm.  Mid pole cyst is 1.1 x 1 by 0 x 1.3 cm.  No hydronephrosis.  Left Kidney:  11.2 cm.  No mass or hydronephrosis.  Bladder:  Normal in appearance.  Incompletely distended following voiding.  IMPRESSION:  1.  Small benign right renal cysts. 2.  No hydronephrosis.   Original Report Authenticated By: Norva Pavlov, M.D.    [place] Art/ven Flow Abd Pelv Doppler  04/20/2012  *RADIOLOGY REPORT*  Clinical Data:  Testicular swelling  SCROTAL ULTRASOUND DOPPLER ULTRASOUND OF THE TESTICLES  Technique: Complete ultrasound examination of the testicles, epididymis, and other scrotal structures was performed.  Color and spectral Doppler ultrasound were also utilized to evaluate blood flow to the testicles.  Comparison:  CT abdomen/pelvis 06/10/2010  Findings:  Right testis:  The right testis measures 4.7 x 3.1 x 3.9 cm  Left testis:  The left testis measures 5.4 x 3.5 x 4.2 cm  Right epididymis:  Enlarged, heterogeneous and hypervascular epididymal tail and body.  Left epididymis:  The left epididymal head appears enlarged heterogeneous, however is not particularly hypervascular.  Hydrocele:  Absent  Varicocele:  There may be a very small right varicocele.  Pulsed Doppler interrogation of both testes demonstrates low resistance flow bilaterally.  IMPRESSION:  1.  Negative for testicular torsion. 2.  Enlarged heterogeneous and hypervascular right epididymal body and tail and enlarged, heterogeneous but not hypervascular left epididymal head.  Overall, the findings are most consistent with bilateral epididymitis worse on the right than the left. 3.  Small right varicocele.   Original Report Authenticated By: Malachy Moan, M.D.      Assessment and Plan: I agree with the formulated  Assessment and Plan with the following changes:  67 year old male with a past medical history significant for chronic kidney disease stage III, hypertension, hyperlipidemia, CAD, diabetes mellitus type 2, BPH, presented due to dysuria and testicular pain. #1 epididymitis: At this time GC/Chlamydia is negative. He denies any other sexual partners other than his wife. He is allergic to penicillin and doxycycline. He is improving with Levaquin and aztreonam. I would continue to follow his temperature, exam, white blood cell count and possibly switch him to oral therapy tomorrow. He may better benefit from Levaquin but we can discuss this with infectious disease. He likely has an underlying urinary tract infection and possibly origin of prostate. #2 CKD stage III: Renal ultrasound is negative for hydronephrosis, he is elevated above baseline. Urine output should be monitored and it is okay to give him IV fluids due to likely prerenal component.  Jonah Blue, DO 1/29/20143:11 PM

## 2012-04-22 DIAGNOSIS — N400 Enlarged prostate without lower urinary tract symptoms: Secondary | ICD-10-CM

## 2012-04-22 DIAGNOSIS — N453 Epididymo-orchitis: Secondary | ICD-10-CM

## 2012-04-22 DIAGNOSIS — N419 Inflammatory disease of prostate, unspecified: Secondary | ICD-10-CM

## 2012-04-22 LAB — URINE CULTURE
Colony Count: 75000
Special Requests: NORMAL

## 2012-04-22 LAB — BASIC METABOLIC PANEL
Chloride: 107 mEq/L (ref 96–112)
GFR calc Af Amer: 49 mL/min — ABNORMAL LOW (ref >90)
GFR calc non Af Amer: 42 mL/min — ABNORMAL LOW (ref >90)
Potassium: 4.4 mEq/L (ref 3.5–5.1)

## 2012-04-22 LAB — CBC
HCT: 28.8 % — ABNORMAL LOW (ref 39.0–52.0)
Hemoglobin: 9.4 g/dL — ABNORMAL LOW (ref 13.0–17.0)
RDW: 14.4 % (ref 11.5–15.5)
WBC: 8.9 10*3/uL (ref 4.0–10.5)

## 2012-04-22 LAB — GLUCOSE, CAPILLARY: Glucose-Capillary: 180 mg/dL — ABNORMAL HIGH (ref 70–99)

## 2012-04-22 MED ORDER — INSULIN ASPART 100 UNIT/ML ~~LOC~~ SOLN
0.0000 [IU] | Freq: Three times a day (TID) | SUBCUTANEOUS | Status: DC
  Administered 2012-04-22: 13:00:00 via SUBCUTANEOUS

## 2012-04-22 MED ORDER — LEVOFLOXACIN 500 MG PO TABS: 500.0000 mg | ORAL_TABLET | Freq: Every day | ORAL | Status: DC

## 2012-04-22 NOTE — Progress Notes (Signed)
Subjective:    Patient states that his dysuria has not recurred. States his testicular swelling is further improved, and has had no straining with urination since yesterday.   Interval Events: No acute events. Febrile to 102.2 overnight.    Objective:    Vital Signs:   Temp:  [98.7 F (37.1 C)-102.2 F (39 C)] 98.7 F (37.1 C) (01/30 0647) Pulse Rate:  [70-90] 70  (01/30 0647) Resp:  [17-20] 20  (01/30 0647) BP: (117-150)/(59-65) 117/59 mmHg (01/30 0647) SpO2:  [97 %-99 %] 98 % (01/30 0647) Weight:  [211 lb (95.709 kg)] 211 lb (95.709 kg) (01/30 0500) Last BM Date: 04/19/12  24-hour weight change: Weight change: 13 lb 9.6 oz (6.169 kg)  Intake/Output:   Intake/Output Summary (Last 24 hours) at 04/22/12 1191 Last data filed at 04/21/12 1200  Gross per 24 hour  Intake    480 ml  Output    550 ml  Net    -70 ml      Physical Exam: General: Vital signs reviewed and noted. Well-developed, well-nourished, in no acute distress; alert, appropriate and cooperative throughout examination.  Lungs: Normal respiratory effort. Clear to auscultation BL without crackles or wheezes.  Heart: RRR. S1 and S2 normal without gallop, murmur, or rubs.  Abdomen/GU: BS normoactive. Soft, Nondistended, non-tender. Enlargement of L epididymis. No TTP. No erythema or edema of scrotal tissue.  Extremities: No pretibial edema.    Labs:  Basic Metabolic Panel:  Lab 04/21/12 4782 04/20/12 1238  NA 138 137  K 4.6 4.7  CL 109 105  CO2 16* 18*  GLUCOSE 142* 97  BUN 49* 53*  CREATININE 1.82* 1.99*  CALCIUM 8.6 8.6  MG -- --  PHOS -- --    Liver Function Tests:  Lab 04/20/12 1238  AST 29  ALT 20  ALKPHOS 156*  BILITOT 0.6  PROT 7.8  ALBUMIN 2.8*   CBC:  Lab 04/21/12 0610 04/20/12 1238  WBC 10.7* 17.2*  NEUTROABS -- 13.2*  HGB 9.7* 10.4*  HCT 29.5* 31.9*  MCV 70.6* 70.6*  PLT 138* 169   CBG:  Lab 04/21/12 1140 04/20/12 0839  GLUCAP 176* 104*     Microbiology: Results for orders placed during the hospital encounter of 04/20/12  CULTURE, BLOOD (ROUTINE X 2)     Status: Normal (Preliminary result)   Collection Time   04/20/12 12:38 PM      Component Value Range Status Comment   Specimen Description BLOOD RIGHT ANTECUBITAL   Final    Special Requests BOTTLES DRAWN AEROBIC AND ANAEROBIC 10CC   Final    Culture  Setup Time 04/20/2012 21:55   Final    Culture     Final    Value:        BLOOD CULTURE RECEIVED NO GROWTH TO DATE CULTURE WILL BE HELD FOR 5 DAYS BEFORE ISSUING A FINAL NEGATIVE REPORT   Report Status PENDING   Incomplete   CULTURE, BLOOD (ROUTINE X 2)     Status: Normal (Preliminary result)   Collection Time   04/20/12 12:45 PM      Component Value Range Status Comment   Specimen Description BLOOD ARM RIGHT   Final    Special Requests BOTTLES DRAWN AEROBIC AND ANAEROBIC 10CC   Final    Culture  Setup Time 04/20/2012 21:55   Final    Culture     Final    Value:        BLOOD CULTURE RECEIVED NO GROWTH TO DATE  CULTURE WILL BE HELD FOR 5 DAYS BEFORE ISSUING A FINAL NEGATIVE REPORT   Report Status PENDING   Incomplete   URINE CULTURE     Status: Normal   Collection Time   04/20/12  1:24 PM      Component Value Range Status Comment   Specimen Description URINE, RANDOM   Final    Special Requests Normal   Final    Culture  Setup Time 04/20/2012 13:53   Final    Colony Count 75,000 COLONIES/ML   Final    Culture PROTEUS MIRABILIS   Final    Report Status 04/22/2012 FINAL   Final    Organism ID, Bacteria PROTEUS MIRABILIS   Final   GC/CHLAMYDIA PROBE AMP     Status: Normal   Collection Time   04/20/12  6:43 PM      Component Value Range Status Comment   CT Probe RNA NEGATIVE  NEGATIVE Final    GC Probe RNA NEGATIVE  NEGATIVE Final    Imaging: US Scrotum  04/20/2012  *RADIOLOGY REPORT*  Clinical Data:  Testicular swelling  SCROTAL ULTRASOUND DOPPLER ULTRASOUND OF THE TESTICLES  Technique: Complete ultrasound examination  of the testicles, epididymis, and other scrotal structures was performed.  Color and spectral Doppler ultrasound were also utilized to evaluate blood flow to the testicles.  Comparison:  CT abdomen/pelvis 06/10/2010  Findings:  Right testis:  The right testis measures 4.7 x 3.1 x 3.9 cm  Left testis:  The left testis measures 5.4 x 3.5 x 4.2 cm  Right epididymis:  Enlarged, heterogeneous and hypervascular epididymal tail and body.  Left epididymis:  The left epididymal head appears enlarged heterogeneous, however is not particularly hypervascular.  Hydrocele:  Absent  Varicocele:  There may be a very small right varicocele.  Pulsed Doppler interrogation of both testes demonstrates low resistance flow bilaterally.  IMPRESSION:  1.  Negative for testicular torsion. 2.  Enlarged heterogeneous and hypervascular right epididymal body and tail and enlarged, heterogeneous but not hypervascular left epididymal head.  Overall, the findings are most consistent with bilateral epididymitis worse on the right than the left. 3.  Small right varicocele.   Original Report Authenticated By: Malachy Moan, M.D.    US Renal  04/20/2012  *RADIOLOGY REPORT*  Clinical Data: Urinary tract infection.  Question of pyelonephritis.  History of diabetes and hypertension.  RENAL/URINARY TRACT ULTRASOUND COMPLETE  Comparison:  CT of the abdomen and pelvis 06/10/2010  Findings:  Right Kidney:  10.5 cm in length.  Upper pole cyst is 1.3 x 1.3 x 1.2 cm.  Mid pole cyst is 1.1 x 1 by 0 x 1.3 cm.  No hydronephrosis.  Left Kidney:  11.2 cm.  No mass or hydronephrosis.  Bladder:  Normal in appearance.  Incompletely distended following voiding.  IMPRESSION:  1.  Small benign right renal cysts. 2.  No hydronephrosis.   Original Report Authenticated By: Norva Pavlov, M.D.    [place] Art/ven Flow Abd Pelv Doppler  04/20/2012  *RADIOLOGY REPORT*  Clinical Data:  Testicular swelling  SCROTAL ULTRASOUND DOPPLER ULTRASOUND OF THE TESTICLES  Technique:  Complete ultrasound examination of the testicles, epididymis, and other scrotal structures was performed.  Color and spectral Doppler ultrasound were also utilized to evaluate blood flow to the testicles.  Comparison:  CT abdomen/pelvis 06/10/2010  Findings:  Right testis:  The right testis measures 4.7 x 3.1 x 3.9 cm  Left testis:  The left testis measures 5.4 x 3.5 x 4.2 cm  Right  epididymis:  Enlarged, heterogeneous and hypervascular epididymal tail and body.  Left epididymis:  The left epididymal head appears enlarged heterogeneous, however is not particularly hypervascular.  Hydrocele:  Absent  Varicocele:  There may be a very small right varicocele.  Pulsed Doppler interrogation of both testes demonstrates low resistance flow bilaterally.  IMPRESSION:  1.  Negative for testicular torsion. 2.  Enlarged heterogeneous and hypervascular right epididymal body and tail and enlarged, heterogeneous but not hypervascular left epididymal head.  Overall, the findings are most consistent with bilateral epididymitis worse on the right than the left. 3.  Small right varicocele.   Original Report Authenticated By: Malachy Moan, M.D.        Medications:    Infusions:    Scheduled Medications:    . amLODipine  10 mg Oral Daily  . aspirin EC  81 mg Oral Daily  . aztreonam  500 mg Intravenous Q8H  . calcium-vitamin D  2 tablet Oral BID  . carvedilol  12.5 mg Oral BID WC  . fluticasone  2 spray Each Nare Daily  . heparin  5,000 Units Subcutaneous Q8H  . insulin aspart protamine-insulin aspart  15 Units Subcutaneous BID WC  . levofloxacin  750 mg Intravenous Q48H  . omega-3 acid ethyl esters  1 g Oral BID  . simvastatin  10 mg Oral q1800  . terazosin  10 mg Oral QHS    PRN Medications: acetaminophen, HYDROcodone-acetaminophen   Assessment/ Plan:   Epididymitis - scrotal u/s revealed findings consistent with bilateral epididymitis. Pt is allergic to amoxicillin and doxycycline. He was started on  aztreonam and levaquin. Febrile overnight to 102.2, but no fevers this AM, and patient states his testicular/scrotal swelling and discomfort continues to improve, and his dysuria has resolved. Leukocytosis improved from 17.2 => 10.7 as of yesterday, AM CBC pending. GC/chlamydia negative. HIV non-reactive.  - cont aztreonam, levaquin  - norco PRN  - likely d/c home today with transition to oral antibiotics and close f/u with Dr. Brunilda Payor  UTI - signs and symptoms are consistent with UTI and possibly prostatitis in addition to epididymitis. Hematuria resolved per pt report, and dysuria resolved, per above. He is being adequately covered for UTI/prostatitis with treatment for epididymitis per above.  - cont levaquin   BPH - on terazosin, followed by Dr. Brunilda Payor. Mild retention symptoms this admission, possibly 2/2 acute bacterial prostatitis. Will avoid prostate exam while concern for acute bacterial prostatitis persists, and will treat empirically. Retention/straining symptoms are improved as of this AM.   HTN - well controlled, will continue all home meds unless he becomes hypotensive. Will hold ACEi in acute setting.   CKD - baseline Cr 1.4-1.6. Cr = 1.99 on admission, improved to 1.82 on 1/30. Renal u/s negative for hydronephrosis.  - check BMET  DM II - will continue 70/30 15U bid and will add SSI for meal coverage. CBGs <200 since admission.   CAD s/p stents - stable, on ACEi, BB, ASA, statin.   SERVICE NEEDED AT DISCHARGE - TO BE DETERMINED DURING HOSPITAL COURSE Y = Yes, Blank = No  PT:    OT:    RN:    Equipment:    Other:        Length of Stay: 2 day(s)   Signed: Elfredia Nevins, MD  PGY-1, Internal Medicine Resident Pager: [phone] (7AM-5PM) 04/22/2012, 8:08 AM

## 2012-04-22 NOTE — Progress Notes (Signed)
INTERNAL MEDICINE TEACHING SERVICE Attending Note  Date: 04/22/2012  Patient name: [name] [name]  Medical record number: [id number]  Date of birth: Oct 02, 1945    This patient has been seen and discussed with the house staff. Please see their note for complete details. I concur with their findings with the following additions/corrections:  He feels well this morning. His testicular swelling has improved. He has no dysuria and no difficulty with urination. His urologist was notified and he will have a followup as an outpatient with Dr. Brunilda Payor. I agree with treatment with Levaquin for approximately 4-6 weeks for treatment of possible underlying prostatitis. He also has epididymitis which may have spread from a prostatic infection. He has culture data positive for Proteus sensitive to Levaquin. His renal function is slowly improving to baseline. He can be restarted on ACE inhibition as his renal function is improving.  He has evidence of microcytic anemia: Review of records shows in 2007 he underwent a colonoscopy with finding of a 5 mm sessile polyp. Repeat was recommended in 5-10 years. I don't see pathology report in our system. Please make sure his primary care physician follows up on rescheduling colonoscopy as an outpatient. He is stable for discharge from medical standpoint.  Jonah Blue, DO  04/22/2012, 11:44 AM

## 2012-04-24 NOTE — Discharge Summary (Signed)
Patient Name:  [name] [name] MRN: [id number]  PCP: Rocco Serene, MD DOB:  [birth year]       Date of Admission:  04/20/2012  Date of Discharge:  04/22/2012     Attending Physician: Dr. Kem Kays         DISCHARGE DIAGNOSES: Epididymitis Prostatitis/UTI BPH Hypertension CKD T2DM CAD s/p stents  DISPOSITION AND FOLLOW-UP: Huntington Leverich is to follow-up with the listed providers as detailed below, at which time, the following should be addressed:   1. Assess for resolution of patient's complaints of testicular swelling and pain. 2. If patient continues to complain of symptoms of straining/urinary retention, consider further evaluation and/or modification of BPH regimen if appropriate (e.g. addition of 5ar-inhibitor) 3. Consider referral to nephrologist if GFR remains <60 (Af Am) at time of hospital follow-up (if deemed at baseline clinically). 4. Labs / imaging needed: BMET, (UA, Ucx if complaints recur) 5. Pending labs/ test needing follow-up: blood cultures (NGTD at discharge)   Follow-up Information    Follow up with NESI,MARC-HENRY, MD. (You have an appointment on 05/06/2012 at 2:30PM with Dr. Brunilda Payor.)    Contact information:   [address] [place] [address] [phone]       Discharge Orders    Future Appointments: Provider: Department: Dept Phone: Center:   05/10/2012 10:00 AM Mathis Dad, MD [facility] [phone] [facility]     Future Orders Please Complete By Expires   Diet - low sodium heart healthy      Increase activity slowly          DISCHARGE MEDICATIONS:   Medication List     As of 04/24/2012  9:46 AM    TAKE these medications         amLODipine 10 MG tablet   Commonly known as: NORVASC   Take 10 mg by mouth every morning.      aspirin EC 81 MG tablet   Take 81 mg by mouth every morning.      calcium citrate-vitamin D 315-200 MG-UNIT per tablet   Commonly known as: CITRACAL+D   Take 2 tablets by  mouth 2 (two) times daily.      carvedilol 12.5 MG tablet   Commonly known as: COREG   Take 1 tablet (12.5 mg total) by mouth 2 (two) times daily with a meal.      fish oil-omega-3 fatty acids 1000 MG capsule   Take 1 capsule (1 g total) by mouth 2 (two) times daily.      fluticasone 50 MCG/ACT nasal spray   Commonly known as: FLONASE   Place 2 sprays into the nose daily.      HYDROcodone-acetaminophen 5-325 MG per tablet   Commonly known as: NORCO/VICODIN   Take 1 tablet by mouth every 4 (four) hours as needed. For pain      insulin aspart protamine-insulin aspart (70-30) 100 UNIT/ML injection   Commonly known as: NOVOLOG 70/30   Inject 30-40 Units into the skin 2 (two) times daily with a meal. 40 units in the morning; 30 units in the evening      Insulin Pen Needle 30G X 8 MM Misc   Commonly known as: NOVOFINE   Inject SQ as directed twice daily, use new pen needle for each injection      levofloxacin 500 MG tablet   Commonly known as: LEVAQUIN   Take 1 tablet (500 mg total) by mouth daily.  lisinopril 10 MG tablet   Commonly known as: PRINIVIL,ZESTRIL   Take 10 mg by mouth every morning.      pravastatin 20 MG tablet   Commonly known as: PRAVACHOL   Take 20 mg by mouth every morning.      terazosin 10 MG capsule   Commonly known as: HYTRIN   Take 10 mg by mouth at bedtime.         CONSULTS:  N/A   PROCEDURES PERFORMED:  US Scrotum  04/20/2012  *RADIOLOGY REPORT*  Clinical Data:  Testicular swelling  SCROTAL ULTRASOUND DOPPLER ULTRASOUND OF THE TESTICLES  Technique: Complete ultrasound examination of the testicles, epididymis, and other scrotal structures was performed.  Color and spectral Doppler ultrasound were also utilized to evaluate blood flow to the testicles.  Comparison:  CT abdomen/pelvis 06/10/2010  Findings:  Right testis:  The right testis measures 4.7 x 3.1 x 3.9 cm  Left testis:  The left testis measures 5.4 x 3.5 x 4.2 cm  Right epididymis:   Enlarged, heterogeneous and hypervascular epididymal tail and body.  Left epididymis:  The left epididymal head appears enlarged heterogeneous, however is not particularly hypervascular.  Hydrocele:  Absent  Varicocele:  There may be a very small right varicocele.  Pulsed Doppler interrogation of both testes demonstrates low resistance flow bilaterally.  IMPRESSION:  1.  Negative for testicular torsion. 2.  Enlarged heterogeneous and hypervascular right epididymal body and tail and enlarged, heterogeneous but not hypervascular left epididymal head.  Overall, the findings are most consistent with bilateral epididymitis worse on the right than the left. 3.  Small right varicocele.   Original Report Authenticated By: Malachy Moan, M.D.    US Renal  04/20/2012  *RADIOLOGY REPORT*  Clinical Data: Urinary tract infection.  Question of pyelonephritis.  History of diabetes and hypertension.  RENAL/URINARY TRACT ULTRASOUND COMPLETE  Comparison:  CT of the abdomen and pelvis 06/10/2010  Findings:  Right Kidney:  10.5 cm in length.  Upper pole cyst is 1.3 x 1.3 x 1.2 cm.  Mid pole cyst is 1.1 x 1 by 0 x 1.3 cm.  No hydronephrosis.  Left Kidney:  11.2 cm.  No mass or hydronephrosis.  Bladder:  Normal in appearance.  Incompletely distended following voiding.  IMPRESSION:  1.  Small benign right renal cysts. 2.  No hydronephrosis.   Original Report Authenticated By: Norva Pavlov, M.D.    [place] Art/ven Flow Abd Pelv Doppler  04/20/2012  *RADIOLOGY REPORT*  Clinical Data:  Testicular swelling  SCROTAL ULTRASOUND DOPPLER ULTRASOUND OF THE TESTICLES  Technique: Complete ultrasound examination of the testicles, epididymis, and other scrotal structures was performed.  Color and spectral Doppler ultrasound were also utilized to evaluate blood flow to the testicles.  Comparison:  CT abdomen/pelvis 06/10/2010  Findings:  Right testis:  The right testis measures 4.7 x 3.1 x 3.9 cm  Left testis:  The left testis measures 5.4 x 3.5  x 4.2 cm  Right epididymis:  Enlarged, heterogeneous and hypervascular epididymal tail and body.  Left epididymis:  The left epididymal head appears enlarged heterogeneous, however is not particularly hypervascular.  Hydrocele:  Absent  Varicocele:  There may be a very small right varicocele.  Pulsed Doppler interrogation of both testes demonstrates low resistance flow bilaterally.  IMPRESSION:  1.  Negative for testicular torsion. 2.  Enlarged heterogeneous and hypervascular right epididymal body and tail and enlarged, heterogeneous but not hypervascular left epididymal head.  Overall, the findings are most consistent with bilateral epididymitis  worse on the right than the left. 3.  Small right varicocele.   Original Report Authenticated By: Malachy Moan, M.D.        ADMISSION DATA: H&P: Mr. Cameron is a 67 yo M with past medical history significant for diabetes, CAD status post stents, TIAs, HTN, HLP, CKD (baseline Cr 1.4-1.6), BPH (followed by Dr. Brunilda Payor with [facility]) presents to clinic for worsening left testicular enlargement x 3 days in duration. He states that he has been having urinary symptoms including dysuria and burning sensation along with mucus with urine production. He also has bilateral flank flank pain is worse on the left side. He had a subjective fever on Sunday but did not measure his temperature. He has problem with starting his urination stream. He states that his urine was orange to red in color from the beginning to the end of his stream but now resolved. He has increasing urinary frequency about 5 times during the daytime and 6-7 times at night. Of note, he states that he had a similar episode happened about 2 years ago. He denies any penile discharge, nausea vomiting, chest pain, shortness of breath, or diarrhea . On January 17, he went to the urgent care for his today and was prescribed amoxicillin. He had an allergic reaction to amoxicillin which made his eyes swollen and  had nasal drainage in his throat. He states that the amoxicillin did help improve his urinary symptoms but it returns as soon as he stopped the amoxicillin. He has decreased appetite in the past several days.   Physical Exam: Blood pressure 132/64, pulse 73, temperature 98.5 F (36.9 C), temperature source Oral, resp. rate 18, height 5\' 9"  (1.753 m), weight 206 lb (93.441 kg), SpO2 100.00%.  General: alert, well-developed, and cooperative to examination.  Head: normocephalic and atraumatic.  Eyes: vision grossly intact, pupils equal, pupils round, pupils reactive to light, no injection and anicteric.  Mouth: pharynx pink and moist, no erythema, and no exudates.  Neck: supple, full ROM, no thyromegaly, no JVD, and no carotid bruits.  Lungs: normal respiratory effort, no accessory muscle use, normal breath sounds, no crackles, and no wheezes. Heart: normal rate, regular rhythm, no murmur, no gallop, and no rub.  Abdomen: soft, non-tender, normal bowel sounds, no distention, no guarding, no rebound tenderness, no hepatomegaly, and no splenomegaly. +bilateral CVA tenderness L>R Msk: no joint swelling, no joint warmth, and no redness over joints.  Pulses: 2+ DP/PT pulses bilaterally Extremities: No cyanosis, clubbing, edema. Right AKA stump wearing prosthesis Neurologic: alert & oriented X3, cranial nerves II-XII intact, strength normal in all extremities, sensation intact to light touch Skin: turgor normal  GU: 8-10 cm right scrotum edema, erythematous without any drainage or crepitus. Tenderness to palpation of the left scrotal area. NO penile discharge or edema or erythema. No adenopathy in inguinal regions.  Psych: Oriented X3, memory intact for recent and remote, normally interactive, good eye contact, not anxious appearing, and not depressed appearing.   Labs: Basic Metabolic Panel:   Basename  04/21/12 0610  04/20/12 1238   NA  138  137   K  4.6  4.7   CL  109  105   CO2  16*  18*     GLUCOSE  142*  97   BUN  49*  53*   CREATININE  1.82*  1.99*   CALCIUM  8.6  8.6   MG  --  --   PHOS  --  --    Liver  Function Tests:   [facility]  04/20/12 1238   AST  29   ALT  20   ALKPHOS  156*   BILITOT  0.6   PROT  7.8   ALBUMIN  2.8*    CBC:   Basename  04/21/12 0610  04/20/12 1238   WBC  10.7*  17.2*   NEUTROABS  --  13.2*   HGB  9.7*  10.4*   HCT  29.5*  31.9*   MCV  70.6*  70.6*   PLT  138*  169    CBG:   Basename  04/20/12 0839   GLUCAP  104*    Hemoglobin A1C:   Basename  04/20/12 0851   HGBA1C  6.2    Urinalysis:   Basename  04/20/12 1324  04/20/12 0851   COLORURINE  YELLOW  --   LABSPEC  1.012  --   PHURINE  6.0  --   GLUCOSEU  NEGATIVE  --   HGBUR  MODERATE*  --   BILIRUBINUR  NEGATIVE  Small   KETONESUR  NEGATIVE  --   PROTEINUR  30*  --   UROBILINOGEN  1.0  1.0   NITRITE  NEGATIVE  Negative   LEUKOCYTESUR  LARGE*  large (3+)      HOSPITAL COURSE: Epididymitis - scrotal u/s revealed findings consistent with bilateral epididymitis. Pt is allergic to amoxicillin and doxycycline and was started on aztreonam and levaquin. He had occasional fevers throughout his hospital course, but otherwise improved very rapidly. His testicular/scrotal swelling and discomfort improved dramatically after starting antibiotics, and his complaints of dysuria and changes in urine appearance (dark, with mucus) resolved completely. Leukocytosis was nearly resolved after starting antibiotics (17.2 => 10.7). GC/chlamydia negative. HIV non-reactive. Urine culture grew proteus mirabilis that was sensitive to all antibiotics other than amp, gent, and nitrofurantoin. As patient was negative for GC and his culture grew an organism sensitive to fluoroquinolones, he was discharged on a 3-week course of levofloxacin after the case was discussed with his urologist, Dr. Brunilda Payor. He will see Dr. Brunilda Payor on 05/06/2012, and will be seen in the [facility] on 05/10/2012. He was instructed to go to the ED  if his symptoms return/worsen, or if he develops worsening fever or has urinary retention. At time of discharge he was having no further testicular/scrotal pain, and his L testicular swelling was markedly improved.   Acute bacterial prostatitis/cystitis - the patient had complaints of dysuria and retention/straining on admission, which suggest that he had some degree of prostatitis and cystitis in addition to the aforementioned epididymitis. The 3-week course of antibiotics he will complete for epididymitis will more than suffice as treatment for these issues, and his complaints of dysuria and straining/retention had completely resolved prior to discharge.  BPH - patient had retention symptoms during course, more likely secondary to prostatitis than to BPH, as they resolved quickly with his other urinary/testicular complaints. Prostate exam was deferred during his hospitalization due to concern for precipitating bacteremia in the setting of acute bacterial prostatitis. He is on terazosin, and will f/u with Dr. Brunilda Payor in the next 2 weeks.   Hypertension - stable. Most of his home meds were continued during his course (amlodipine, coreg, terazosin), and lisinopril was resumed at discharge after resolution of his AKI. At hospital follow-up, his lisinopril should again be held if his AKI has recurred.   AKI on CKD stage III - Cr = 1.99 on admission (baseline ~1.5-1.6). Likely secondary to urinary retention associated with acute bacterial  prostatitis. Renal u/s was unrevealing of hydronephrosis. After starting antibiotics this issue resolved, and his creatinine at time of discharge was around his baseline (1.62). As his GFR appears to now be in the range of CKD stage III (GFR = 49 at discharge), referral to nephrology may be warranted if BMET at outpatient follow-up confirms his GFR is <60 at baseline.   T2DM - stable. Patient's A1c = 6.2. His home insulin regimen of 70/30 40U with breakfast and 30U with dinner  was continued at discharge.   CAD s/p stents - stable on ASA, coreg.   DISCHARGE DATA: Vital Signs: BP 150/80  Pulse 80  Temp 98.7 F (37.1 C) (Oral)  Resp 20  Ht 5\' 9"  (1.753 m)  Wt 211 lb (95.709 kg)  BMI 31.16 kg/m2  SpO2 98%  Labs: No results found for this or any previous visit (from the past 24 hour(s)).   Time spent on discharge: 40 minutes  Services Ordered on Discharge: Y = Yes; Blank = No PT:   OT:   RN:   Equipment:   Other:     Signed: Elfredia Nevins, MD   PGY 1, Internal Medicine Resident 04/24/2012, 9:46 AM

## 2012-04-26 LAB — CULTURE, BLOOD (ROUTINE X 2): Culture: NO GROWTH

## 2012-04-26 NOTE — Discharge Summary (Signed)
INTERNAL MEDICINE TEACHING SERVICE Attending Note  Date: 04/26/2012  Patient name: [name] [name]  Medical record number: [id number]  Date of birth: 19-Mar-1946    This patient has been seen and discussed with the house staff. Please see their note for complete details. I concur with their findings and plan. Refer to my note on date of discharge.   Jonah Blue, DO  04/26/2012, 8:49 AM

## 2012-04-28 ENCOUNTER — Telehealth: Payer: Self-pay | Admitting: Dietician

## 2012-04-28 NOTE — Telephone Encounter (Addendum)
Discharge date:04/22/12 Call date: 04/28/12 and 04/30/12 Hospital follow up appointment date: 05/10/12 at 10:00 AM with Dr. Anselm Jungling  Calling to assist with transition of care from hospital to home. 04/28/12 no answer and was not able to leave message. Will try back later.   Marland KitchenDischarge medications reviewed: no- his wife did not report any problems  Able to fill all prescriptions? Yes per his wife  Patient aware of hospital follow up appointments. Reminded his wife  No problems with transportation. Denied.   Other problems/concerns: requested ideas for snacks and energy drinks that are okay for him. Suggested Glucerna shake or something comparable and made snack suggestions. Will mail them snack ideas per their request.

## 2012-05-10 ENCOUNTER — Encounter: Payer: Self-pay | Admitting: Internal Medicine

## 2012-05-10 ENCOUNTER — Ambulatory Visit (INDEPENDENT_AMBULATORY_CARE_PROVIDER_SITE_OTHER): Payer: Medicare PPO | Admitting: Internal Medicine

## 2012-05-10 VITALS — BP 108/62 | HR 67 | Ht 68.0 in | Wt 200.0 lb

## 2012-05-10 DIAGNOSIS — E1151 Type 2 diabetes mellitus with diabetic peripheral angiopathy without gangrene: Secondary | ICD-10-CM

## 2012-05-10 DIAGNOSIS — N259 Disorder resulting from impaired renal tubular function, unspecified: Secondary | ICD-10-CM

## 2012-05-10 DIAGNOSIS — K5900 Constipation, unspecified: Secondary | ICD-10-CM

## 2012-05-10 DIAGNOSIS — N289 Disorder of kidney and ureter, unspecified: Secondary | ICD-10-CM

## 2012-05-10 DIAGNOSIS — I10 Essential (primary) hypertension: Secondary | ICD-10-CM

## 2012-05-10 DIAGNOSIS — E1159 Type 2 diabetes mellitus with other circulatory complications: Secondary | ICD-10-CM

## 2012-05-10 DIAGNOSIS — N453 Epididymo-orchitis: Secondary | ICD-10-CM

## 2012-05-10 DIAGNOSIS — N451 Epididymitis: Secondary | ICD-10-CM

## 2012-05-10 DIAGNOSIS — I798 Other disorders of arteries, arterioles and capillaries in diseases classified elsewhere: Secondary | ICD-10-CM

## 2012-05-10 LAB — GLUCOSE, CAPILLARY: Glucose-Capillary: 127 mg/dL — ABNORMAL HIGH (ref 70–99)

## 2012-05-10 LAB — BASIC METABOLIC PANEL WITH GFR
CO2: 19 mEq/L (ref 19–32)
GFR, Est African American: 49 mL/min — ABNORMAL LOW
Glucose, Bld: 131 mg/dL — ABNORMAL HIGH (ref 70–99)
Potassium: 4.5 mEq/L (ref 3.5–5.3)
Sodium: 137 mEq/L (ref 135–145)

## 2012-05-10 MED ORDER — PRAVASTATIN SODIUM 20 MG PO TABS: 20.0000 mg | ORAL_TABLET | Freq: Every morning | ORAL | Status: DC

## 2012-05-10 MED ORDER — DOCUSATE SODIUM 100 MG PO CAPS: 100.0000 mg | ORAL_CAPSULE | Freq: Two times a day (BID) | ORAL | Status: DC | PRN

## 2012-05-10 MED ORDER — CARVEDILOL 12.5 MG PO TABS: 12.5000 mg | ORAL_TABLET | Freq: Two times a day (BID) | ORAL | Status: DC

## 2012-05-10 MED ORDER — DOCUSATE SODIUM 100 MG PO CAPS: 100.0000 mg | ORAL_CAPSULE | Freq: Two times a day (BID) | ORAL | Status: DC

## 2012-05-10 MED ORDER — AMLODIPINE BESYLATE 10 MG PO TABS: 10.0000 mg | ORAL_TABLET | Freq: Every morning | ORAL | Status: DC

## 2012-05-10 NOTE — Assessment & Plan Note (Signed)
Resolved.  He has 1 more pill of levaquin left.  He will follow up with Dr. Brunilda Payor soon.

## 2012-05-10 NOTE — Assessment & Plan Note (Addendum)
Lab Results  Component Value Date   HGBA1C 6.2 04/20/2012   HGBA1C 6.6 01/15/2012   HGBA1C 7.1 07/28/2011     Assessment:  Diabetes control:  well controlled. I reviewed his log book, CBG range 101-200's with fasting 130's.  Progress toward A1C goal:   yes   Plan:  Medications:  Will continue current regimen of Novolog 70/30  Home glucose monitoring:   Frequency:   3-4   Timing:    Instruction/counseling given:yes  Educational resources provided: handout  Self management tools provided:   Other plans: Given his episode of hypoglycemia this morning, I instructed patient that  If his fasting blood sugar in the morning is below 70, I want him to decrease Novolog by 5 units.  Our goal is to keep blood sugar between 70-130  fasting.   When his blood sugar is less than 70 or he starts feeling shaky, tremor, or sweating, he can drink 4-8 ounces of apple or orange juice, eat crackers, or take a glucose tablet which he can buy over the counter

## 2012-05-10 NOTE — Assessment & Plan Note (Addendum)
BP Readings from Last 3 Encounters:  05/10/12 108/62  04/22/12 150/80  04/20/12 132/69    Lab Results  Component Value Date   NA 137 04/22/2012   K 4.4 04/22/2012   CREATININE 1.62* 04/22/2012    Assessment:  Blood pressure control:  Yes  Progress toward BP goal:   Yes   Plan:  Medications:  I refilled his amlodipine 10mg , carvedilol 12.5mg  bid. Will also continue lisinopril 10 mg  Educational resources provided:   yes  Self management tools provided:  yes  Other plans: check BMP today

## 2012-05-10 NOTE — Assessment & Plan Note (Signed)
Likely due to narcotics.   -Start colace 100mg  qd PRN

## 2012-05-10 NOTE — Patient Instructions (Addendum)
You may take Colace 100 mg daily as needed for constipation I refilled  your medication and sent them to your pharmacy already  If your fasting blood sugar in the morning (before you eat) is below 70, I want you to decrease Novolog by 5 units.  Our goal is to keep your blood sugar between 70-130 when you first wake up in the morning fasting.   When your blood sugar is less than 70 or you start feeling shaky, tremor, or sweating, you can drink 4-8 ounces of apple or orange juice, eat crackers, or take a glucose tablet which you can buy over the counter Remember to check your blood sugar when you get up in the morning and also 2-3 times per day Will get labs today and I will call you with any abnormal lab results Will send you to a kidney specialist as we talked about Followup with your primary care doctor in 2-3 months

## 2012-05-10 NOTE — Addendum Note (Signed)
Addended by: Carrolyn Meiers T on: 05/10/2012 11:37 AM   Modules accepted: Orders

## 2012-05-10 NOTE — Assessment & Plan Note (Signed)
Likely due to hypertension and diabetes. Baseline Cr 1.6 with GFR 49.  He has not seen nephrology in the past. -Will repeat BMP today -Refer to nephrology

## 2012-05-10 NOTE — Progress Notes (Signed)
Patient ID: Kendrik Mcshan, male   DOB: Nov 21, 1945, 67 y.o.   MRN: [id number] History of present illness: Mr. Shepperson is a 67 year old man with past medical history of CKD baseline creatinine 1.6 with GFR 49, BPH, DM II, who was recently admitted to the hospital on 1/28- 1/30 4 bilateral epididymis and was initially treated with aztreonam and Levaquin. He also had a urine culture that grew Proteus mirabilis.  He was sent home with 3 weeks course of Levaquin and that his urologist was notified. He had an appointment with Dr. Harriett Sine on 213 but was unable to make an appointment due to the snowstorm. His wife is trying to contact Dr. Harriett Sine office for an appointment soon. He states that his testicular pain is now completely resolved. He has almost completed 3 weeks course of Levaquin. He has one pill left in the bottle. He denies any fever, chills, nausea, vomiting. He does have constipation in which he feel like his stools are hard. Of note he is on chronic narcotics for pain.  He states that this morning he only ate to eggs for breakfast and that his sugar dropped to 61 on the way to the clinic today and that his vision was blurry during hypoglycemic episode. His wife gave him chicken biscuit which brought his sugar back to normal. He states that usually his blood sugars in the 120s at home. Blood sugar in the clinic today was 129.  Review of system: As per history of present illness  Physical examination: General: alert, well-developed, and cooperative to examination.  Lungs: normal respiratory effort, no accessory muscle use, normal breath sounds, no crackles, and no wheezes. Heart: normal rate, regular rhythm, no murmur, no gallop, and no rub.  Abdomen: soft, non-tender, normal bowel sounds, no distention, no guarding, no rebound tenderness Neurologic: nonfocal Skin: turgor normal and no rashes.  Psych: appropriate

## 2012-06-01 ENCOUNTER — Telehealth: Payer: Self-pay | Admitting: Dietician

## 2012-06-03 NOTE — Telephone Encounter (Signed)
[name] [name] called to find out if we have paperwork from [facility] for diabetes supplies. Front office has completed paperwork  To fax today. Took this opportunity to educate patient and spouse about CMS limitations of charges of diabetes supplies at retail chain pharmacies.

## 2012-08-06 ENCOUNTER — Encounter: Payer: Self-pay | Admitting: Internal Medicine

## 2012-08-06 ENCOUNTER — Ambulatory Visit (INDEPENDENT_AMBULATORY_CARE_PROVIDER_SITE_OTHER): Payer: Medicare PPO | Admitting: Internal Medicine

## 2012-08-06 VITALS — BP 140/76 | HR 66 | Temp 96.9°F | Ht 68.0 in | Wt 212.5 lb

## 2012-08-06 DIAGNOSIS — I10 Essential (primary) hypertension: Secondary | ICD-10-CM

## 2012-08-06 DIAGNOSIS — E1149 Type 2 diabetes mellitus with other diabetic neurological complication: Secondary | ICD-10-CM

## 2012-08-06 DIAGNOSIS — R351 Nocturia: Secondary | ICD-10-CM

## 2012-08-06 DIAGNOSIS — E785 Hyperlipidemia, unspecified: Secondary | ICD-10-CM

## 2012-08-06 DIAGNOSIS — J309 Allergic rhinitis, unspecified: Secondary | ICD-10-CM

## 2012-08-06 DIAGNOSIS — E1122 Type 2 diabetes mellitus with diabetic chronic kidney disease: Secondary | ICD-10-CM

## 2012-08-06 DIAGNOSIS — E1142 Type 2 diabetes mellitus with diabetic polyneuropathy: Secondary | ICD-10-CM

## 2012-08-06 DIAGNOSIS — N401 Enlarged prostate with lower urinary tract symptoms: Secondary | ICD-10-CM

## 2012-08-06 DIAGNOSIS — E1129 Type 2 diabetes mellitus with other diabetic kidney complication: Secondary | ICD-10-CM

## 2012-08-06 DIAGNOSIS — J302 Other seasonal allergic rhinitis: Secondary | ICD-10-CM

## 2012-08-06 DIAGNOSIS — I2510 Atherosclerotic heart disease of native coronary artery without angina pectoris: Secondary | ICD-10-CM

## 2012-08-06 DIAGNOSIS — I129 Hypertensive chronic kidney disease with stage 1 through stage 4 chronic kidney disease, or unspecified chronic kidney disease: Secondary | ICD-10-CM

## 2012-08-06 HISTORY — DX: Type 2 diabetes mellitus with diabetic chronic kidney disease: E11.22

## 2012-08-06 LAB — POCT GLYCOSYLATED HEMOGLOBIN (HGB A1C): Hemoglobin A1C: 5.8

## 2012-08-06 LAB — GLUCOSE, CAPILLARY: Glucose-Capillary: 155 mg/dL — ABNORMAL HIGH (ref 70–99)

## 2012-08-06 LAB — LIPID PANEL
HDL: 21 mg/dL — ABNORMAL LOW (ref >39)
Triglycerides: 255 mg/dL — ABNORMAL HIGH (ref <150)

## 2012-08-06 MED ORDER — INSULIN ASPART PROT & ASPART (70-30 MIX) 100 UNIT/ML ~~LOC~~ SUSP: 30.0000 [IU] | Freq: Two times a day (BID) | SUBCUTANEOUS | Status: DC

## 2012-08-06 NOTE — Assessment & Plan Note (Signed)
He has had no chest pain on his aspirin, carvedilol, amlodipine, and lisinopril. We will continue this therapy. His cholesterol is also at target on the pravastatin and this will be continued at 20 mg by mouth daily.

## 2012-08-06 NOTE — Assessment & Plan Note (Signed)
He has done well through the allergy season I just the fluticasone nasal spray twice daily. He did not require any antihistamine therapy for this allergy season. We will continue the fluticasone during his allergy seasons.

## 2012-08-06 NOTE — Assessment & Plan Note (Signed)
He notes an occasional hypoglycemic episode around 10 AM.  He has faithfully taken his insulin 70/30 at 40 units in the AM and 30 units in the PM.  He has not experienced hypoglycemic episodes at other times.  His blood pressure today was at target at 140/76 and his hemoglobin A1c was 5.8. This suggests that his glycemic control is too good and he was asked to decrease his 7030-35 units in the morning and 30 units in the evening. His LDL within the last year was at target so we will also continue the proper call at 20 mg every morning. He had a diabetic eye exam with Dr. Luciana Axe within the last 2-3 months and we will try to get that note.

## 2012-08-06 NOTE — Progress Notes (Signed)
  Subjective:    Patient ID: [name] [name], male    DOB: January 17, 1946, 67 y.o.   MRN: [id number]  HPI  Please see the A&P for the status of the pt's chronic medical problems.  Review of Systems  Constitutional: Negative for fever, activity change, fatigue and unexpected weight change.  HENT: Positive for rhinorrhea. Negative for congestion.   Eyes: Positive for itching.  Respiratory: Negative for chest tightness and shortness of breath.   Cardiovascular: Positive for leg swelling. Negative for chest pain.  Gastrointestinal: Positive for constipation. Negative for diarrhea.  Genitourinary: Negative for dysuria and difficulty urinating.  Musculoskeletal: Positive for gait problem. Negative for myalgias, joint swelling and arthralgias.  Skin: Negative for rash and wound.  Allergic/Immunologic: Positive for environmental allergies.  Neurological: Positive for numbness. Negative for syncope and weakness.  Psychiatric/Behavioral: Negative for dysphoric mood. The patient is not nervous/anxious.       Objective:   Physical Exam  Nursing note and vitals reviewed. Constitutional: He is oriented to person, place, and time. He appears well-developed and well-nourished. No distress.  HENT:  Head: Normocephalic and atraumatic.  Eyes: Conjunctivae are normal. Right eye exhibits no discharge. Left eye exhibits no discharge. No scleral icterus.  Pulmonary/Chest: Effort normal.  Musculoskeletal: Normal range of motion. He exhibits no edema and no tenderness.  Neurological: He is alert and oriented to person, place, and time.  Skin: Skin is warm and dry. He is not diaphoretic.  Psychiatric: He has a normal mood and affect. His behavior is normal. Judgment and thought content normal.      Assessment & Plan:   Please see Problem Based Charting

## 2012-08-06 NOTE — Patient Instructions (Signed)
It was good to see you again.  You are doing an excellent job with your diabetes.  1) Keep taking your medications as you are except for the insulin.  2) Decrease the morning insulin to only 35 units and keep the evening insulin at 30 units.  3) I will refer you to a walking rehabilitation center to build your confidence in your walking.  4) We will cancel your nephrology appointment scheduled for June.  5) I will call you if your cholesterol needs to be worked on.  6) We will try to get your colonoscopy and eye doctor's reports.  I will see you in 6 months, sooner if necessary.

## 2012-08-06 NOTE — Assessment & Plan Note (Signed)
He is pleased with his urinary symptoms On the terazosin and wishes to continue the current regimen as is. He feels no further evaluation is necessary at this time.

## 2012-08-06 NOTE — Assessment & Plan Note (Signed)
His blood pressure was at target on the amlodipine 10 mg daily, carvedilol 12.5 mg by mouth twice a day, lisinopril 10 mg daily, and terazosin 10 mg at bedtime. We will therefore continue this regimen.

## 2012-08-09 NOTE — Progress Notes (Signed)
Total cholesterol 97 Triglycerides 255 HDL 21 LDL 25  LDL at target.  Will continue continue the pravastatin at 20 mg PO QHS.  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 We discussed this and what the nephrologists would do. I informed them they would aggressively address the blood pressure, cholesterol, and diabetic control. I doubt they would do anything else at this point for his diabetic nephropathy. We therefore decided to cancel this appointment as these were things I was monitoring and goals we were meeting together. When he progresses to stage IV kidney disease we both agreed at that point he should be referred to nephrology for further evaluation and care.

## 2012-08-20 ENCOUNTER — Encounter: Payer: Self-pay | Admitting: Internal Medicine

## 2012-08-20 DIAGNOSIS — K644 Residual hemorrhoidal skin tags: Secondary | ICD-10-CM

## 2012-08-20 DIAGNOSIS — K648 Other hemorrhoids: Secondary | ICD-10-CM

## 2012-08-20 HISTORY — DX: Residual hemorrhoidal skin tags: K64.4

## 2012-09-08 ENCOUNTER — Other Ambulatory Visit: Payer: Self-pay | Admitting: Internal Medicine

## 2012-09-30 ENCOUNTER — Other Ambulatory Visit: Payer: Self-pay

## 2012-10-07 ENCOUNTER — Telehealth: Payer: Self-pay | Admitting: *Deleted

## 2012-10-07 NOTE — Telephone Encounter (Signed)
Somebody else made the referral.  We discussed this and what the nephrologists would do. I informed them they would aggressively address the blood pressure, cholesterol, and diabetic control. I doubt they would do anything else at this point for his diabetic nephropathy. We therefore decided to cancel this appointment as these were things I was monitoring and goals we were meeting together. When he progresses to stage IV kidney disease we both agreed at that point he should be referred to nephrology for further evaluation and care.  Thanks.

## 2012-10-07 NOTE — Telephone Encounter (Signed)
Received call from Debra with [facility].  Pt had previously been referred there and had an appt scheduled for 06/19, which was canceled by the patient. Pt informed [facility] that [facility] had decided to place referral on hold at this time.  She wanted confirmation from our office that this was ok as pt is already at stage 3.  Will forward to pcp for review.  Please advise.[name] Spittle Cass[place]7/17/20144:31 PM

## 2012-10-20 ENCOUNTER — Ambulatory Visit (INDEPENDENT_AMBULATORY_CARE_PROVIDER_SITE_OTHER): Payer: Medicare PPO | Admitting: Internal Medicine

## 2012-10-20 ENCOUNTER — Encounter: Payer: Self-pay | Admitting: Internal Medicine

## 2012-10-20 VITALS — BP 127/69 | HR 50 | Temp 97.6°F | Ht 68.0 in | Wt 211.1 lb

## 2012-10-20 DIAGNOSIS — I10 Essential (primary) hypertension: Secondary | ICD-10-CM

## 2012-10-20 DIAGNOSIS — M549 Dorsalgia, unspecified: Secondary | ICD-10-CM

## 2012-10-20 DIAGNOSIS — S88911A Complete traumatic amputation of right lower leg, level unspecified, initial encounter: Secondary | ICD-10-CM

## 2012-10-20 DIAGNOSIS — S88919A Complete traumatic amputation of unspecified lower leg, level unspecified, initial encounter: Secondary | ICD-10-CM

## 2012-10-20 DIAGNOSIS — T148XXA Other injury of unspecified body region, initial encounter: Secondary | ICD-10-CM | POA: Insufficient documentation

## 2012-10-20 MED ORDER — PRAVASTATIN SODIUM 20 MG PO TABS: 20.0000 mg | ORAL_TABLET | Freq: Every morning | ORAL | Status: DC

## 2012-10-20 MED ORDER — CARVEDILOL 12.5 MG PO TABS: 12.5000 mg | ORAL_TABLET | Freq: Two times a day (BID) | ORAL | Status: DC

## 2012-10-20 MED ORDER — AMLODIPINE BESYLATE 10 MG PO TABS: 10.0000 mg | ORAL_TABLET | Freq: Every morning | ORAL | Status: DC

## 2012-10-20 MED ORDER — LISINOPRIL 10 MG PO TABS: 10.0000 mg | ORAL_TABLET | Freq: Every morning | ORAL | Status: DC

## 2012-10-20 MED ORDER — FLUTICASONE PROPIONATE 50 MCG/ACT NA SUSP: 2.0000 | Freq: Every day | NASAL | Status: DC

## 2012-10-20 NOTE — Assessment & Plan Note (Signed)
A:  The patients right sided back pain is likely 2/2 to a rhomboid strain and appears to be improving without significant intervention.  P:  I recommended that the patient use heat, ice, and massage for the pain. The patient was happy with and agree to this plan.

## 2012-10-20 NOTE — Assessment & Plan Note (Signed)
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 The patient has no problems with his current prosthesis.   P:  I hand wrote a prescription for a "prosthetic device" as instructed by the prosthetic device company. This will allow the patient to buy new stockings and waist bands.

## 2012-10-20 NOTE — Progress Notes (Signed)
Case discussed with Dr. Komanski (at time of visit, soon after the resident saw the patient).  We reviewed the resident's history and exam and pertinent patient test results.  I agree with the assessment, diagnosis, and plan of care documented in the resident's note.  

## 2012-10-20 NOTE — Progress Notes (Signed)
Subjective:   Patient ID: [name] [name] male   DOB: 06-14-1945 67 y.o.   MRN: [id number]  HPI: [name] [name] is a 67 y.o. male with a pmhx detailed below who comes to the clinic today to request a prescription for equipment for his prosthetic leg. 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 The patient reports no problems with his leg. He states that he ran out of the stockings that allow his prosthesis to be held onto his leg. As he only has one left, he has to was it daily, which limits his ambulation. Further, he needs a knew waist belt strap that connects to the prosthesis.  The patient also complains of a mildly sore back. The pain is located between his shoulder blade and spine. The patient noticed it after doing a lot of gardening 3 weeks ago. It appears to be slowly improving without any intervention.   Past Medical History  Diagnosis Date  . 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 Hyperlipidemia LDL goal < 100 04/02/2006  . Essential hypertension 12/09/2006  . Coronary artery disease 04/02/2006    s/p 2 stents, specifics unknown   . Benign prostatic hypertrophy with nocturia 07/07/2006  . Chronic venous insufficiency 04/12/2010  . Obesity (BMI 30.0-34.9) 01/15/2012  . Constipation 05/25/2009    Intermittent   . Microcytic normochromic anemia 05/27/2006  . Seasonal allergic rhinitis 05/25/2009  . Degenerative joint  disease involving multiple joints 02/02/2007  . Internal and external hemorrhoids without complication 08/20/2012   Current Outpatient Prescriptions  Medication Sig Dispense Refill  . amLODipine (NORVASC) 10 MG tablet Take 1 tablet (10 mg total) by mouth every morning.  30 tablet  6  . aspirin EC 81 MG tablet Take 81 mg by mouth every morning.      . calcium citrate-vitamin D (CITRACAL+D) 315-200 MG-UNIT per tablet Take 2 tablets by mouth 2 (two) times daily.      . carvedilol (COREG) 12.5 MG tablet Take 1 tablet (12.5 mg total) by mouth 2 (two) times daily with a meal.  60 tablet  6  . docusate sodium (COLACE) 100 MG capsule Take 1 capsule (100 mg total) by mouth 2 (two) times daily as needed for constipation.  100 capsule  3  . fish oil-omega-3 fatty acids 1000 MG capsule Take 1 capsule (1 g total) by mouth 2 (two) times daily.  180 capsule  3  . fluticasone (FLONASE) 50 MCG/ACT nasal spray Place 2 sprays into the nose daily.  16 g  11  . insulin aspart protamine- aspart (NOVOLOG 70/30) (70-30) 100 UNIT/ML injection Inject 0.3-0.35 mLs (30-35 Units total) into the skin 2 (two) times daily with a meal. 35 units in the morning; 30 units in the evening  10 mL  11  . Insulin Pen Needle (NOVOFINE) 30G X 8 MM MISC Inject SQ as directed twice daily, use new pen needle for each injection  2 each  11  . lisinopril (PRINIVIL,ZESTRIL) 10 MG tablet Take 1 tablet (10 mg total) by mouth every morning.  30 tablet  6  . pravastatin (PRAVACHOL) 20 MG tablet Take 1 tablet (20 mg total) by mouth every morning.  30 tablet  6  . terazosin (HYTRIN) 10 MG capsule Take 10 mg by mouth at bedtime.       No current facility-administered medications for this visit.   Family History  Problem Relation Age of Onset  . Hypertension Mother   . Heart attack Father   . Breast cancer Sister   . Arthritis Sister   . Heart attack Brother   . Diabetes Brother   . Stroke Brother   . Pneumonia Daughter   . Diabetes Brother     . Alcoholism Brother   . Diabetes Brother   . Arthritis Sister     Bilateral knee replacement  . HIV Daughter   . Hypertension Daughter   . Drug abuse Daughter   . Schizophrenia Daughter   . Hypertension Daughter   . Drug abuse Daughter   . Bipolar disorder Daughter    History   Social History  . Marital Status: Married    Spouse Name: N/A    Number of Children: N/A  . Years of Education: N/A   Social History Main Topics  . Smoking status: Former Smoker    Quit date: 06/16/1968  . Smokeless tobacco: Never Used  . Alcohol Use: No  . Drug Use: No  . Sexually Active: Yes    Birth Control/ Protection: None   Other Topics Concern  . None   Social History Narrative  . None   Review of Systems:  Review of Systems  Constitutional: Negative for fever and chills.  HENT: Negative for sore throat.   Eyes: Negative for blurred vision and double vision.  Respiratory: Negative for cough and shortness of breath.   Cardiovascular: Negative for chest pain, palpitations and leg swelling.  Gastrointestinal: Negative for nausea, vomiting, diarrhea and constipation.  Genitourinary: Negative for dysuria and urgency.  Skin: Negative for rash.  Neurological: Negative for headaches.   Objective:  Physical Exam: Filed Vitals:   10/20/12 1355  BP: 127/69  Pulse: 50  Temp: 97.6 F (36.4 C)  TempSrc: Oral  Height: 5\' 8"  (1.727 m)  Weight: 211 lb 1.6 oz (95.754 kg)  SpO2: 98%   Physical Exam  Constitutional: He appears well-developed and well-nourished. No distress.  HENT:  Head: Normocephalic and atraumatic.  Mouth/Throat: Oropharynx is clear and moist. No oropharyngeal exudate.  Eyes: EOM are normal. Pupils are equal, round, and reactive to light.  Cardiovascular: Normal rate, regular rhythm and normal heart sounds.  Exam reveals no friction rub.   No murmur heard. Pulmonary/Chest: Effort normal and breath sounds normal. No respiratory distress. He has no wheezes. He has no  rales.  Musculoskeletal:  Mildly tender muscle belly between the scapula and the spine (rhomboid).   Assessment & Plan:   I recommended that the patient f/u with his PCP in 2-3 months for his chronic medical problems.

## 2012-10-20 NOTE — Patient Instructions (Addendum)
I wrote a written prescription for prosthetic device. Your appointment is August 4th at 11 am.  The pain in your back is likely a muscle strain. Use heat and ice for relief.  Follow up with Dr. Josem Kaufmann 2-3 in months

## 2012-12-31 ENCOUNTER — Ambulatory Visit (INDEPENDENT_AMBULATORY_CARE_PROVIDER_SITE_OTHER): Payer: Medicare PPO | Admitting: Internal Medicine

## 2012-12-31 ENCOUNTER — Encounter: Payer: Self-pay | Admitting: Internal Medicine

## 2012-12-31 VITALS — BP 151/75 | HR 70 | Temp 97.2°F | Wt 216.5 lb

## 2012-12-31 DIAGNOSIS — Z23 Encounter for immunization: Secondary | ICD-10-CM

## 2012-12-31 DIAGNOSIS — I10 Essential (primary) hypertension: Secondary | ICD-10-CM

## 2012-12-31 DIAGNOSIS — E1142 Type 2 diabetes mellitus with diabetic polyneuropathy: Secondary | ICD-10-CM

## 2012-12-31 DIAGNOSIS — N401 Enlarged prostate with lower urinary tract symptoms: Secondary | ICD-10-CM

## 2012-12-31 DIAGNOSIS — E1149 Type 2 diabetes mellitus with other diabetic neurological complication: Secondary | ICD-10-CM

## 2012-12-31 DIAGNOSIS — E785 Hyperlipidemia, unspecified: Secondary | ICD-10-CM

## 2012-12-31 DIAGNOSIS — R351 Nocturia: Secondary | ICD-10-CM

## 2012-12-31 LAB — POCT GLYCOSYLATED HEMOGLOBIN (HGB A1C): Hemoglobin A1C: 6

## 2012-12-31 MED ORDER — LISINOPRIL 20 MG PO TABS: 20.0000 mg | ORAL_TABLET | Freq: Every morning | ORAL | Status: DC

## 2012-12-31 MED ORDER — ZOSTER VACCINE LIVE 19400 UNT/0.65ML ~~LOC~~ SOLR: 0.6500 mL | Freq: Once | SUBCUTANEOUS | Status: DC

## 2012-12-31 MED ORDER — INSULIN ASPART PROT & ASPART (70-30 MIX) 100 UNIT/ML ~~LOC~~ SUSP: 30.0000 [IU] | Freq: Two times a day (BID) | SUBCUTANEOUS | Status: DC

## 2012-12-31 MED ORDER — PRAVASTATIN SODIUM 20 MG PO TABS: 20.0000 mg | ORAL_TABLET | Freq: Every morning | ORAL | Status: DC

## 2012-12-31 NOTE — Assessment & Plan Note (Signed)
He was given a prescription for Zostavax and will check the price at the pharmacy. He was also given the flu vaccination today.

## 2012-12-31 NOTE — Patient Instructions (Signed)
It was great to see you again.  You are doing a great job with your diabetes!  1) I increased your lisinopril to 20 mg by mouth daily.  This is for your blood pressure and kidneys.  2) We decreased your 70/30 insulin to 30 units twice a day.  3) We gave you the flu shot today.  4) We gave you a prescription for the Zoster vaccination.  I recommend it to prevent a bad rash which is very painful.  You may want to check the price beforehand.  I will see you back in 4-6 months for a recheck, sooner if necessary.

## 2012-12-31 NOTE — Assessment & Plan Note (Signed)
His blood pressure was elevated above target today. This is despite taking the amlodipine 10 mg daily, carvedilol 12.5 mg twice daily, Terazosin 10 mg at night, and lisinopril 10 mg daily. Given his diabetes and chronic stage III kidney disease we need to be more aggressive with his blood pressure control. Therefore we decided to increase his lisinopril to 20 mg by mouth daily. We will check a BMP at the followup visit to make sure that his renal function remains stable and is potassium does not become elevated with the slight increase in the ACE inhibitor dose. We will also bring him back to clinic within 3 months to check the control of his blood pressure.

## 2012-12-31 NOTE — Progress Notes (Signed)
  Subjective:    Patient ID: [name] [name], male    DOB: 11-29-1945, 67 y.o.   MRN: [id number]  HPI  Please see the A&P for the status of the pt's chronic medical problems.  Review of Systems  Constitutional: Negative for activity change and appetite change.  Respiratory: Negative for chest tightness, shortness of breath and wheezing.   Cardiovascular: Positive for leg swelling. Negative for chest pain and palpitations.  Gastrointestinal: Positive for constipation. Negative for nausea, vomiting, abdominal pain, diarrhea and abdominal distention.  Genitourinary: Negative for difficulty urinating.  Musculoskeletal: Positive for gait problem. Negative for arthralgias, back pain, joint swelling and myalgias.  Skin: Positive for rash. Negative for wound.  Neurological: Negative for weakness and light-headedness.      Objective:   Physical Exam  Nursing note and vitals reviewed. Constitutional: He is oriented to person, place, and time. He appears well-developed and well-nourished. No distress.  HENT:  Head: Normocephalic and atraumatic.  Eyes: Conjunctivae are normal. Right eye exhibits no discharge. Left eye exhibits no discharge. No scleral icterus.  Cardiovascular: Normal rate, regular rhythm and normal heart sounds.  Exam reveals no gallop and no friction rub.   No murmur heard. Pulmonary/Chest: Effort normal and breath sounds normal. No respiratory distress. He has no wheezes. He has no rales.  Abdominal: Soft. Bowel sounds are normal. He exhibits no distension. There is no tenderness. There is no rebound and no guarding.  Musculoskeletal: Normal range of motion. He exhibits edema. He exhibits no tenderness.  s/p right AKA.  Neurological: He is alert and oriented to person, place, and time. He exhibits normal muscle tone.  Skin: Skin is warm and dry. Rash noted. He is not diaphoretic. No erythema.  Psychiatric: He has a normal mood and affect. His behavior is normal. Judgment and  thought content normal.      Assessment & Plan:   Please see problem oriented charting.

## 2012-12-31 NOTE — Assessment & Plan Note (Signed)
The pravastatin prescription was rewritten for a 90 day supply as it had inadvertently been changed at the last clinic visit. We will repeat a lipid panel to assure he is at target next May.

## 2012-12-31 NOTE — Assessment & Plan Note (Signed)
At the last clinic visit we lowered his a.m. 70/30 from 40 units to 35 units each morning. We continued the p.m. dose of 30 units. With this change she has noted a decrease in the number of hypoglycemic episodes he has had although they continue to happen on occasion. His morning blood sugars are very well controlled usually between 100-150. His hemoglobin A1c today was 6.0 despite the decrease in the insulin dose last time. Given his macrovascular disease one could argue that very tight control is less likely to improve outcomes. We therefore decided together to decrease his 70/30 to 30 units subcutaneous twice a day. We will repeat a hemoglobin A1c at the next visit and see how well his control is on the decreased dose. I suspect this should further decrease, and hopefully eliminate, the number of hypoglycemic episodes he has had. A left diabetic foot exam was performed today. He recently saw his ophthalmologist.

## 2012-12-31 NOTE — Assessment & Plan Note (Signed)
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 He is quite pleased with this therapy and inform me that his urologist feels he is doing so well he only needs to be followed in their office on a yearly basis.

## 2013-05-24 ENCOUNTER — Encounter (HOSPITAL_COMMUNITY): Payer: Self-pay | Admitting: Emergency Medicine

## 2013-05-24 ENCOUNTER — Observation Stay (HOSPITAL_COMMUNITY)
Admission: EM | Admit: 2013-05-24 | Discharge: 2013-05-25 | Disposition: A | Discharge status: Home / Self Care | Payer: Medicare PPO | Attending: Internal Medicine | Admitting: Internal Medicine

## 2013-05-24 ENCOUNTER — Emergency Department (HOSPITAL_COMMUNITY): Payer: Medicare PPO

## 2013-05-24 DIAGNOSIS — Z7982 Long term (current) use of aspirin: Secondary | ICD-10-CM | POA: Insufficient documentation

## 2013-05-24 DIAGNOSIS — I872 Venous insufficiency (chronic) (peripheral): Secondary | ICD-10-CM | POA: Insufficient documentation

## 2013-05-24 DIAGNOSIS — N183 Chronic kidney disease, stage 3 unspecified: Secondary | ICD-10-CM | POA: Insufficient documentation

## 2013-05-24 DIAGNOSIS — N3944 Nocturnal enuresis: Secondary | ICD-10-CM | POA: Insufficient documentation

## 2013-05-24 DIAGNOSIS — K644 Residual hemorrhoidal skin tags: Secondary | ICD-10-CM | POA: Insufficient documentation

## 2013-05-24 DIAGNOSIS — I10 Essential (primary) hypertension: Secondary | ICD-10-CM

## 2013-05-24 DIAGNOSIS — N401 Enlarged prostate with lower urinary tract symptoms: Secondary | ICD-10-CM | POA: Insufficient documentation

## 2013-05-24 DIAGNOSIS — N289 Disorder of kidney and ureter, unspecified: Secondary | ICD-10-CM

## 2013-05-24 DIAGNOSIS — Z794 Long term (current) use of insulin: Secondary | ICD-10-CM | POA: Insufficient documentation

## 2013-05-24 DIAGNOSIS — Z6832 Body mass index (BMI) 32.0-32.9, adult: Secondary | ICD-10-CM | POA: Insufficient documentation

## 2013-05-24 DIAGNOSIS — E785 Hyperlipidemia, unspecified: Secondary | ICD-10-CM | POA: Insufficient documentation

## 2013-05-24 DIAGNOSIS — I798 Other disorders of arteries, arterioles and capillaries in diseases classified elsewhere: Secondary | ICD-10-CM | POA: Insufficient documentation

## 2013-05-24 DIAGNOSIS — K648 Other hemorrhoids: Secondary | ICD-10-CM | POA: Insufficient documentation

## 2013-05-24 DIAGNOSIS — E1142 Type 2 diabetes mellitus with diabetic polyneuropathy: Secondary | ICD-10-CM | POA: Insufficient documentation

## 2013-05-24 DIAGNOSIS — N529 Male erectile dysfunction, unspecified: Secondary | ICD-10-CM | POA: Insufficient documentation

## 2013-05-24 DIAGNOSIS — M159 Polyosteoarthritis, unspecified: Secondary | ICD-10-CM | POA: Insufficient documentation

## 2013-05-24 DIAGNOSIS — N138 Other obstructive and reflux uropathy: Secondary | ICD-10-CM | POA: Insufficient documentation

## 2013-05-24 DIAGNOSIS — Z9861 Coronary angioplasty status: Secondary | ICD-10-CM | POA: Insufficient documentation

## 2013-05-24 DIAGNOSIS — I2510 Atherosclerotic heart disease of native coronary artery without angina pectoris: Secondary | ICD-10-CM | POA: Insufficient documentation

## 2013-05-24 DIAGNOSIS — E1149 Type 2 diabetes mellitus with other diabetic neurological complication: Secondary | ICD-10-CM | POA: Insufficient documentation

## 2013-05-24 DIAGNOSIS — J309 Allergic rhinitis, unspecified: Secondary | ICD-10-CM | POA: Insufficient documentation

## 2013-05-24 DIAGNOSIS — D509 Iron deficiency anemia, unspecified: Secondary | ICD-10-CM | POA: Insufficient documentation

## 2013-05-24 DIAGNOSIS — K2970 Gastritis, unspecified, without bleeding: Secondary | ICD-10-CM

## 2013-05-24 DIAGNOSIS — R0789 Other chest pain: Secondary | ICD-10-CM | POA: Insufficient documentation

## 2013-05-24 DIAGNOSIS — Z87891 Personal history of nicotine dependence: Secondary | ICD-10-CM | POA: Insufficient documentation

## 2013-05-24 DIAGNOSIS — E669 Obesity, unspecified: Secondary | ICD-10-CM | POA: Insufficient documentation

## 2013-05-24 DIAGNOSIS — E1159 Type 2 diabetes mellitus with other circulatory complications: Secondary | ICD-10-CM | POA: Insufficient documentation

## 2013-05-24 DIAGNOSIS — E875 Hyperkalemia: Principal | ICD-10-CM

## 2013-05-24 DIAGNOSIS — I129 Hypertensive chronic kidney disease with stage 1 through stage 4 chronic kidney disease, or unspecified chronic kidney disease: Secondary | ICD-10-CM | POA: Insufficient documentation

## 2013-05-24 DIAGNOSIS — K219 Gastro-esophageal reflux disease without esophagitis: Secondary | ICD-10-CM | POA: Insufficient documentation

## 2013-05-24 DIAGNOSIS — K5900 Constipation, unspecified: Secondary | ICD-10-CM | POA: Insufficient documentation

## 2013-05-24 LAB — BASIC METABOLIC PANEL
BUN: 33 mg/dL — ABNORMAL HIGH (ref 6–23)
BUN: 37 mg/dL — ABNORMAL HIGH (ref 6–23)
CALCIUM: 8.5 mg/dL (ref 8.4–10.5)
CALCIUM: 8.4 mg/dL (ref 8.4–10.5)
CO2: 17 meq/L — AB (ref 19–32)
CO2: 17 meq/L — AB (ref 19–32)
CREATININE: 1.64 mg/dL — AB (ref 0.50–1.35)
CREATININE: 1.77 mg/dL — AB (ref 0.50–1.35)
Chloride: 109 mEq/L (ref 96–112)
Chloride: 109 mEq/L (ref 96–112)
GFR calc Af Amer: 47 mL/min — ABNORMAL LOW (ref >90)
GFR calc Af Amer: 43 mL/min — ABNORMAL LOW (ref >90)
GFR calc non Af Amer: 41 mL/min — ABNORMAL LOW (ref >90)
GFR calc non Af Amer: 37 mL/min — ABNORMAL LOW (ref >90)
GLUCOSE: 208 mg/dL — AB (ref 70–99)
GLUCOSE: 182 mg/dL — AB (ref 70–99)
Potassium: 6 mEq/L — ABNORMAL HIGH (ref 3.7–5.3)
Potassium: 5.3 mEq/L (ref 3.7–5.3)
Sodium: 140 mEq/L (ref 137–147)
Sodium: 139 mEq/L (ref 137–147)

## 2013-05-24 LAB — I-STAT TROPONIN, ED: Troponin i, poc: 0.01 ng/mL (ref 0.00–0.08)

## 2013-05-24 LAB — CBC
HCT: 33.3 % — ABNORMAL LOW (ref 39.0–52.0)
Hemoglobin: 11 g/dL — ABNORMAL LOW (ref 13.0–17.0)
MCH: 23.8 pg — AB (ref 26.0–34.0)
MCHC: 33 g/dL (ref 30.0–36.0)
MCV: 72.1 fL — AB (ref 78.0–100.0)
PLATELETS: 104 10*3/uL — AB (ref 150–400)
RBC: 4.62 MIL/uL (ref 4.22–5.81)
RDW: 14.3 % (ref 11.5–15.5)
WBC: 6.6 10*3/uL (ref 4.0–10.5)

## 2013-05-24 LAB — GLUCOSE, CAPILLARY: Glucose-Capillary: 166 mg/dL — ABNORMAL HIGH (ref 70–99)

## 2013-05-24 LAB — POTASSIUM: Potassium: 6.1 mEq/L — ABNORMAL HIGH (ref 3.7–5.3)

## 2013-05-24 MED ORDER — SODIUM CHLORIDE 0.9 % IJ SOLN
3.0000 mL | Freq: Two times a day (BID) | INTRAMUSCULAR | Status: DC
Start: 2013-05-24 — End: 2013-05-25
  Administered 2013-05-24 – 2013-05-25 (×2): 3 mL via INTRAVENOUS

## 2013-05-24 MED ORDER — HEPARIN SODIUM (PORCINE) 5000 UNIT/ML IJ SOLN
5000.0000 [IU] | Freq: Three times a day (TID) | INTRAMUSCULAR | Status: DC
  Administered 2013-05-24 – 2013-05-25 (×2): 5000 [IU] via SUBCUTANEOUS
  Filled 2013-05-24 (×5): qty 1

## 2013-05-24 MED ORDER — PANTOPRAZOLE SODIUM 40 MG PO TBEC
40.0000 mg | DELAYED_RELEASE_TABLET | Freq: Once | ORAL | Status: AC
  Administered 2013-05-24: 40 mg via ORAL
  Filled 2013-05-24: qty 1

## 2013-05-24 MED ORDER — ACETAMINOPHEN 650 MG RE SUPP: 650.0000 mg | Freq: Four times a day (QID) | RECTAL | Status: DC | PRN

## 2013-05-24 MED ORDER — ASPIRIN EC 81 MG PO TBEC
81.0000 mg | DELAYED_RELEASE_TABLET | Freq: Every morning | ORAL | Status: DC
  Administered 2013-05-25: 81 mg via ORAL
  Filled 2013-05-24: qty 1

## 2013-05-24 MED ORDER — POLYVINYL ALCOHOL 1.4 % OP SOLN
1.0000 [drp] | Freq: Four times a day (QID) | OPHTHALMIC | Status: DC | PRN
  Filled 2013-05-24: qty 15

## 2013-05-24 MED ORDER — SIMVASTATIN 10 MG PO TABS
10.0000 mg | ORAL_TABLET | Freq: Every day | ORAL | Status: DC
  Filled 2013-05-24: qty 1

## 2013-05-24 MED ORDER — INSULIN ASPART PROT & ASPART (70-30 MIX) 100 UNIT/ML ~~LOC~~ SUSP
20.0000 [IU] | Freq: Two times a day (BID) | SUBCUTANEOUS | Status: DC
  Administered 2013-05-24: 20 [IU] via SUBCUTANEOUS
  Filled 2013-05-24: qty 10

## 2013-05-24 MED ORDER — SODIUM POLYSTYRENE SULFONATE 15 GM/60ML PO SUSP
30.0000 g | Freq: Once | ORAL | Status: AC
  Administered 2013-05-24: 30 g via ORAL
  Filled 2013-05-24: qty 120

## 2013-05-24 MED ORDER — SODIUM CHLORIDE 0.9 % IJ SOLN
3.0000 mL | Freq: Two times a day (BID) | INTRAMUSCULAR | Status: DC
  Administered 2013-05-25: 3 mL via INTRAVENOUS

## 2013-05-24 MED ORDER — PANTOPRAZOLE SODIUM 20 MG PO TBEC: 20.0000 mg | DELAYED_RELEASE_TABLET | Freq: Every day | ORAL | Status: DC

## 2013-05-24 MED ORDER — GI COCKTAIL ~~LOC~~
30.0000 mL | Freq: Once | ORAL | Status: AC
  Administered 2013-05-24: 30 mL via ORAL
  Filled 2013-05-24: qty 30

## 2013-05-24 MED ORDER — TERAZOSIN HCL 5 MG PO CAPS
10.0000 mg | ORAL_CAPSULE | Freq: Every day | ORAL | Status: DC
  Administered 2013-05-24: 10 mg via ORAL
  Filled 2013-05-24 (×2): qty 2

## 2013-05-24 MED ORDER — AMLODIPINE BESYLATE 10 MG PO TABS
10.0000 mg | ORAL_TABLET | Freq: Every morning | ORAL | Status: DC
  Administered 2013-05-25: 10 mg via ORAL
  Filled 2013-05-24: qty 1

## 2013-05-24 MED ORDER — PANTOPRAZOLE SODIUM 40 MG PO TBEC
40.0000 mg | DELAYED_RELEASE_TABLET | Freq: Every day | ORAL | Status: DC
  Administered 2013-05-25: 40 mg via ORAL
  Filled 2013-05-24: qty 1

## 2013-05-24 MED ORDER — CARVEDILOL 12.5 MG PO TABS
12.5000 mg | ORAL_TABLET | Freq: Two times a day (BID) | ORAL | Status: DC
  Administered 2013-05-24 – 2013-05-25 (×2): 12.5 mg via ORAL
  Filled 2013-05-24 (×4): qty 1

## 2013-05-24 MED ORDER — SODIUM CHLORIDE 0.9 % IJ SOLN: 3.0000 mL | INTRAMUSCULAR | Status: DC | PRN

## 2013-05-24 MED ORDER — ACETAMINOPHEN 325 MG PO TABS
650.0000 mg | ORAL_TABLET | Freq: Four times a day (QID) | ORAL | Status: DC | PRN
  Administered 2013-05-25: 650 mg via ORAL
  Filled 2013-05-24: qty 2

## 2013-05-24 MED ORDER — SODIUM CHLORIDE 0.9 % IV SOLN: 250.0000 mL | INTRAVENOUS | Status: DC | PRN

## 2013-05-24 MED ORDER — FUROSEMIDE 40 MG PO TABS
40.0000 mg | ORAL_TABLET | Freq: Every day | ORAL | Status: DC
  Administered 2013-05-25: 40 mg via ORAL
  Filled 2013-05-24: qty 1

## 2013-05-24 MED ORDER — SODIUM CHLORIDE 0.9 % IV SOLN
INTRAVENOUS | Status: AC
  Administered 2013-05-24: 22:00:00 via INTRAVENOUS

## 2013-05-24 MED ORDER — HYPROMELLOSE (GONIOSCOPIC) 2.5 % OP SOLN: 2.0000 [drp] | Freq: Four times a day (QID) | OPHTHALMIC | Status: DC | PRN

## 2013-05-24 NOTE — H&P (Signed)
Date: 05/24/2013               Patient Name:  [name] [name] MRN: [id number]  DOB: 09-27-1945 Age / Sex: 68 y.o., male   PCP: Karren Cobble, MD              Medical Service: Internal Medicine Teaching Service              Attending Physician: Dr. Dominic Pea, DO    First Contact: [name], MS III Pager: [phone]  Second Contact: Dr. [name] Pager: [phone]  Third Contact Dr. Randell Loop Pager: [phone]       After Hours (After 5p/  First Contact Pager: [phone]  weekends / holidays): Second Contact Pager: [phone]   Chief Complaint: Chest Pain  History of Present Illness:  This is a 68 y.o. Man who presents with  intermittent, sharp chest pain that occurs both day and night, which feels like his prior heartburn, and last 10-15 minutes. Resolved with administration of pantoprazole $RemoveBeforeD'40mg'[id number]$  in the ED. There is no associated cough, shortness of breath, fever, chills, nausea, vomiting, weakness, or dizziness. Patient denies dysuria and moves his bowels every other day with Miralax. He's been somewhat less active than usual because of the cold weather. He can walk, but prefers using a wheelchair, because his right leg prosthesis. He denies pain or swelling in the left leg. He is taking his medications as prescribed.   The patient was found to have a potassium of 6 without EKG changes in the ED. Lisinopril was increased from $RemoveBefore'10mg'SQkWAfctXPlkS$  to $R'20mg'SE$  in October 2014, and was asked to follow up with [facility] to check for hyperkalemia. Patient reports a two week stretch in the last month where he ate bananas every day. The patient had one episode of hyperkalemia in the past with K+ of 5.4 in 2013.   Meds: No current facility-administered medications for this encounter.   Current Outpatient Prescriptions  Medication Sig Dispense Refill  . amLODipine (NORVASC) 10 MG tablet Take 1 tablet (10 mg total) by mouth every morning.  30 tablet  6  . aspirin EC 81 MG tablet Take 81 mg by mouth every  morning.      . calcium citrate-vitamin D (CITRACAL+D) 315-200 MG-UNIT per tablet Take 1 tablet by mouth daily.       . carvedilol (COREG) 12.5 MG tablet Take 1 tablet (12.5 mg total) by mouth 2 (two) times daily with a meal.  60 tablet  6  . docusate sodium (COLACE) 100 MG capsule Take 1 capsule (100 mg total) by mouth 2 (two) times daily as needed for constipation.  100 capsule  3  . fish oil-omega-3 fatty acids 1000 MG capsule Take 1 g by mouth daily.      . fluticasone (FLONASE) 50 MCG/ACT nasal spray Place 2 sprays into the nose daily as needed for allergies.      . hydroxypropyl methylcellulose (ISOPTO TEARS) 2.5 % ophthalmic solution Place 2 drops into both eyes 4 (four) times daily as needed for dry eyes.      . insulin aspart protamine- aspart (NOVOLOG MIX 70/30) (70-30) 100 UNIT/ML injection Inject 0.3 mLs (30 Units total) into the skin 2 (two) times daily with a meal.  10 mL  11  . Insulin Pen Needle (NOVOFINE) 30G X 8 MM MISC Inject SQ as directed twice daily, use new pen needle for each injection  2 each  11  . lisinopril (PRINIVIL,ZESTRIL) 20 MG tablet Take 1 tablet (  20 mg total) by mouth every morning.  90 tablet  3  . Multiple Vitamins-Minerals (MULTIVITAMIN WITH MINERALS) tablet Take 1 tablet by mouth daily.      . pravastatin (PRAVACHOL) 20 MG tablet Take 1 tablet (20 mg total) by mouth every morning.  90 tablet  3  . terazosin (HYTRIN) 10 MG capsule Take 10 mg by mouth at bedtime.      . pantoprazole (PROTONIX) 20 MG tablet Take 1 tablet (20 mg total) by mouth daily.  30 tablet  0    Allergies: Allergies as of 05/24/2013 - Review Complete 05/24/2013  Allergen Reaction Noted  . Amoxicillin Other (See Comments) 04/21/2012  . Doxycycline  12/02/2011  . Tamsulosin     Past Medical History  Diagnosis Date  . 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 Hyperlipidemia LDL goal < 100 04/02/2006  . Essential hypertension 12/09/2006  . Coronary artery disease 04/02/2006    s/p 2 stents, specifics unknown   . Benign prostatic hypertrophy with nocturia 07/07/2006  . Chronic venous insufficiency 04/12/2010  . Obesity (BMI 30.0-34.9) 01/15/2012  . Constipation 05/25/2009    Intermittent   . Microcytic normochromic anemia 05/27/2006  . Seasonal allergic rhinitis 05/25/2009  . Degenerative joint disease involving multiple joints 02/02/2007  . Internal and external hemorrhoids without complication 0/25/8527   Past Surgical History  Procedure Laterality Date  . Pilonidal cyst excision    . Eye surgery      Laser  . Fracture surgery      Fractured stump   Family History  Problem Relation Age of Onset  . Hypertension Mother   . Heart attack Father   . Breast cancer Sister   . Arthritis Sister   . Heart attack Brother   . Diabetes Brother   . Stroke Brother   . Pneumonia Daughter   . Diabetes Brother   . Alcoholism Brother   . Diabetes Brother   . Arthritis Sister     Bilateral knee replacement  . HIV Daughter   . Hypertension Daughter   . Drug abuse Daughter   . Schizophrenia Daughter   . Hypertension Daughter   . Drug abuse Daughter   . Bipolar disorder Daughter    History   Social History  . Marital Status: Married    Spouse Name: N/A    Number of Children: N/A  . Years of Education: N/A   Occupational History  . Not on file.   Social History Main Topics  . Smoking status: Former Smoker    Quit date: 06/16/1968  . Smokeless tobacco: Never Used  . Alcohol Use: No   . Drug Use: No  . Sexual Activity: Yes    Birth Control/ Protection: None   Other Topics Concern  . Not on file   Social History Narrative  . No narrative on file    Review of Systems: Pertinent items are noted in HPI.  Physical Exam: Blood pressure 142/68, pulse 58, temperature 98 F (36.7 C),  temperature source Oral, resp. rate 12, weight 97.523 kg (215 lb), SpO2 100.00%. BP 144/68  Pulse 63  Temp(Src) 98 F (36.7 C) (Oral)  Resp 18  Wt 97.523 kg (215 lb)  SpO2 99% General appearance: alert, cooperative, no distress and moderately obese Head: Normocephalic, without obvious abnormality, atraumatic Neck: no adenopathy, no carotid bruit, no JVD, supple, symmetrical, trachea midline and thyroid not enlarged, symmetric, no tenderness/mass/nodules Back: symmetric, no curvature. ROM normal. No CVA tenderness. Lungs: clear to auscultation bilaterally Chest wall: no tenderness Heart: regular rate and rhythm, S1, S2 normal, no murmur, click, rub or gallop Abdomen: soft, non-tender; bowel sounds normal; no masses,  no organomegaly Extremities: Pretibial edema and history of R BKA Pulses: 2+ and symmetric at radial arteries Skin: Skin color, texture, turgor normal. No rashes or lesions  Lab results: Recent Results (from the past 2160 hour(s))  BASIC METABOLIC PANEL     Status: Abnormal   Collection Time    05/24/13 11:30 AM      Result Value Ref Range   Sodium 140  137 - 147 mEq/L   Potassium 6.0 (*) 3.7 - 5.3 mEq/L   Chloride 109  96 - 112 mEq/L   CO2 17 (*) 19 - 32 mEq/L   Glucose, Bld 208 (*) 70 - 99 mg/dL   BUN 33 (*) 6 - 23 mg/dL   Creatinine, Ser 0.88 (*) 0.50 - 1.35 mg/dL   Calcium 8.5  8.4 - 90.1 mg/dL   GFR calc non Af Amer 41 (*) >90 mL/min   GFR calc Af Amer 47 (*) >90 mL/min   Comment: (NOTE)     The eGFR has been calculated using the CKD EPI equation.     This calculation has not been validated in all clinical situations.     eGFR's persistently <90 mL/min  signify possible Chronic Kidney     Disease.  CBC     Status: Abnormal   Collection Time    05/24/13 11:30 AM      Result Value Ref Range   WBC 6.6  4.0 - 10.5 K/uL   RBC 4.62  4.22 - 5.81 MIL/uL   Hemoglobin 11.0 (*) 13.0 - 17.0 g/dL   HCT 38.5 (*) 52.8 - 94.2 %   MCV 72.1 (*) 78.0 - 100.0 fL   MCH 23.8 (*) 26.0 - 34.0 pg   MCHC 33.0  30.0 - 36.0 g/dL   RDW 40.6  42.9 - 42.4 %   Platelets 104 (*) 150 - 400 K/uL   Comment: PLATELET COUNT CONFIRMED BY SMEAR  I-STAT TROPOININ, ED     Status: None   Collection Time    05/24/13 11:41 AM      Result Value Ref Range   Troponin i, poc 0.01  0.00 - 0.08 ng/mL   Comment 3            Comment: Due to the release kinetics of cTnI,     a negative result within the first hours     of the onset of symptoms does not rule out     myocardial infarction with certainty.     If myocardial infarction is still suspected,     repeat the test at appropriate intervals.  POTASSIUM     Status: Abnormal   Collection Time    05/24/13  3:13 PM      Result Value Ref Range   Potassium 6.1 (*) 3.7 - 5.3 mEq/L     Imaging results:  Dg Chest  2 View  05/24/2013   CLINICAL DATA:  Chest pain  EXAM: CHEST  2 VIEW  COMPARISON:  07/14/2006  FINDINGS: The heart size and mediastinal contours are within normal limits. Both lungs are clear. The visualized skeletal structures are unremarkable.  IMPRESSION: No active cardiopulmonary disease.   Electronically Signed   By: Franchot Gallo M.D.   On: 05/24/2013 11:00   EKG Interpretation  Date/Time: Tuesday May 24 2013 16:55:50 EST  Ventricular Rate: 61  PR Interval: 155  QRS Duration: 142  QT Interval: 429  QTC Calculation: 432  R Axis: -50  Text Interpretation: Sinus rhythm Right bundle branch block LVH by  voltage Since last tracing of earlier today No significant change was  found Confirmed by Eulis Foster MD, ELLIOTT [phone]) on 05/24/2013 5:02:27 PM  Assessment & Plan by Problem: Active Problems:   Hyperkalemia  1)  Chest Pain The patient presented with intermittent, sharp chest pain similar to his previous episodes of heartburn relieved with pantoprazole 40mg  with no associated symptoms and a normal EKG.  Plan: Pantoprazole 40mg . Follow up with PCP for reflux management  2) Hyperkalemia  The patient presents with asymptomatic hyperkalemia of 6 in the setting of recently increased lisinopril from 10mg  to 20mg  for better blood pressure control.  Plan: Decrease lisinopril to 10mg  QD. Start Furosemide 20mg  QD. Follow up with PCP for further management of hypertension.  3) Diabetes and Renal Diet Education Patient has many questions about the components of a renal diet. He was provided literature and teaching from the treatment team, but would benefit from discussion with a nutritionist. Plan: Referral to nutritionist for dietary education  This is a Careers information officer Note.  The care of the patient was discussed with Dr. Naaman Plummer and the assessment and plan was formulated with their assistance.  Please see their note for official documentation of the patient encounter.   Signed: Donnelly Angelica, Med Student 05/24/2013, 5:35 PM

## 2013-05-24 NOTE — H&P (Signed)
Date: 05/24/2013               Patient Name:  [name] [name] MRN: [id number]  DOB: [birth year] Age / Sex: 68 y.o., male   PCP: Rocco Serene, MD         Medical Service: Internal Medicine Teaching Service         Attending Physician: Dr. Jonah Blue, DO    First Contact: Dr. Johna Roles Pager: [phone]  Second Contact: Dr. Garald Braver Pager: [phone]       After Hours (After 5p/  First Contact Pager: [phone]  weekends / holidays): Second Contact Pager: [phone]   Chief Complaint:  Chest pain, high potassium  History of Present Illness:   [name] [name] is a 68 year old man with past medical history of insulin dependent Type II DM, CKD Stage 3, HTN, and CAD s/p stenting who presents with chest pain and found to have high potassium levels.  Pt reports that 2 weeks ago he began having intermittent (10-15 minutes) sharp chest pain described as heart burn without reflux symptoms that would resolve spontaneously without associated dyspnea, palpitations, diaphoresis, or lightheadedness. He is not on PPI therapy at home. He has history of CAD s/p stenting with no recent stress testing. He has regular follow-up with his cardiologist Dr. Ian Malkin.       In the ED he was given GI cocktail and protonix with resolution of his chest pain. He was also found to have potasium of 6 without EKG changes. He reports recently consuming butter milk, sweet potatoes, plums, and bananas (2-3 weeks) ago. He had recent increase in his lisinopril dosage from 10 to 20 mg daily in October. He had one episode of hyperkalemia in the past of 5.4 in 2013. He denies muscle cramping or weakness. He has chronic constipation and takes laxative as needed.     Meds: No current facility-administered medications for this encounter.   Current Outpatient Prescriptions  Medication Sig Dispense Refill  . amLODipine (NORVASC) 10 MG tablet Take 1 tablet (10 mg total) by mouth every morning.  30 tablet  6  . aspirin EC 81 MG tablet  Take 81 mg by mouth every morning.      . calcium citrate-vitamin D (CITRACAL+D) 315-200 MG-UNIT per tablet Take 1 tablet by mouth daily.       . carvedilol (COREG) 12.5 MG tablet Take 1 tablet (12.5 mg total) by mouth 2 (two) times daily with a meal.  60 tablet  6  . docusate sodium (COLACE) 100 MG capsule Take 1 capsule (100 mg total) by mouth 2 (two) times daily as needed for constipation.  100 capsule  3  . fish oil-omega-3 fatty acids 1000 MG capsule Take 1 g by mouth daily.      . fluticasone (FLONASE) 50 MCG/ACT nasal spray Place 2 sprays into the nose daily as needed for allergies.      . hydroxypropyl methylcellulose (ISOPTO TEARS) 2.5 % ophthalmic solution Place 2 drops into both eyes 4 (four) times daily as needed for dry eyes.      . insulin aspart protamine- aspart (NOVOLOG MIX 70/30) (70-30) 100 UNIT/ML injection Inject 0.3 mLs (30 Units total) into the skin 2 (two) times daily with a meal.  10 mL  11  . Insulin Pen Needle (NOVOFINE) 30G X 8 MM MISC Inject SQ as directed twice daily, use new pen needle for each injection  2 each  11  . lisinopril (PRINIVIL,ZESTRIL) 20 MG tablet Take 1 tablet (20 mg  total) by mouth every morning.  90 tablet  3  . Multiple Vitamins-Minerals (MULTIVITAMIN WITH MINERALS) tablet Take 1 tablet by mouth daily.      . pravastatin (PRAVACHOL) 20 MG tablet Take 1 tablet (20 mg total) by mouth every morning.  90 tablet  3  . terazosin (HYTRIN) 10 MG capsule Take 10 mg by mouth at bedtime.      . pantoprazole (PROTONIX) 20 MG tablet Take 1 tablet (20 mg total) by mouth daily.  30 tablet  0    Allergies: Allergies as of 05/24/2013 - Review Complete 05/24/2013  Allergen Reaction Noted  . Amoxicillin Other (See Comments) 04/21/2012  . Doxycycline  12/02/2011  . Tamsulosin     Past Medical History  Diagnosis Date  . 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 Type 2 diabetes mellitus with ophthalmic  manifestations 04/02/2006    s/p laser surgery for severe diabetic  bilateral non-proliferative retinopathy (2013)    . 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 Hyperlipidemia LDL goal < 100 04/02/2006  . Essential hypertension 12/09/2006  . Coronary artery disease 04/02/2006    s/p 2 stents, specifics unknown   . Benign prostatic hypertrophy with nocturia 07/07/2006  . Chronic venous insufficiency 04/12/2010  . Obesity (BMI 30.0-34.9) 01/15/2012  . Constipation 05/25/2009    Intermittent   . Microcytic normochromic anemia 05/27/2006  . Seasonal allergic rhinitis 05/25/2009  . Degenerative joint disease involving multiple joints 02/02/2007  . Internal and external hemorrhoids without complication 08/20/2012   Past Surgical History  Procedure Laterality Date  . Pilonidal cyst excision    . Eye surgery      Laser  . Fracture surgery      Fractured stump   Family History  Problem Relation Age of Onset  . Hypertension Mother   . Heart attack Father   . Breast cancer Sister   . Arthritis Sister   . Heart attack Brother   . Diabetes Brother   . Stroke Brother   . Pneumonia Daughter   . Diabetes Brother   . Alcoholism Brother   . Diabetes Brother   . Arthritis Sister     Bilateral knee replacement  . HIV Daughter   . Hypertension Daughter   . Drug abuse Daughter   . Schizophrenia Daughter   . Hypertension Daughter   . Drug abuse Daughter   . Bipolar disorder Daughter    History   Social History  . Marital Status: Married    Spouse Name: N/A    Number of Children: N/A  . Years of Education: N/A   Occupational History  . Not on file.   Social History Main Topics  . Smoking status: Former Smoker    Quit date: 06/16/1968  . Smokeless tobacco: Never  Used  . Alcohol Use: No  . Drug Use: No  . Sexual Activity: Yes    Birth Control/ Protection: None   Other Topics Concern  . Not on file   Social History Narrative  . No narrative on file    Review of Systems:  Review of Systems  Constitutional: Negative for fever, chills, malaise/fatigue and diaphoresis.  HENT: Negative for congestion and sore throat.  Eyes: Negative for blurred vision.  Respiratory: Negative for cough and shortness of breath.   Cardiovascular: Positive for chest pain and leg swelling (left LE). Negative for palpitations.  Gastrointestinal: Positive for heartburn and constipation (chronic ). Negative for nausea, vomiting, abdominal pain, diarrhea and blood in stool.  Genitourinary: Negative for dysuria, urgency, frequency and hematuria.  Musculoskeletal: Negative for myalgias.  Neurological: Negative for dizziness, weakness and headaches.     Physical Exam: Blood pressure 144/81, pulse 64, temperature 98 F (36.7 C), temperature source Oral, resp. rate 11, weight 97.523 kg (215 lb), SpO2 100.00%. Physical Exam  Constitutional: He is oriented to person, place, and time. He appears well-developed and well-nourished. No distress.  HENT:  Head: Normocephalic and atraumatic.  Right Ear: External ear normal.  Left Ear: External ear normal.  Nose: Nose normal.  Mouth/Throat: Oropharynx is clear and moist. No oropharyngeal exudate.  Eyes: Conjunctivae and EOM are normal. Pupils are equal, round, and reactive to light. Right eye exhibits no discharge. Left eye exhibits no discharge.  Neck: Normal range of motion. Neck supple.  Cardiovascular: Normal rate, regular rhythm and normal heart sounds.   No murmur heard. Pulmonary/Chest: Effort normal. No respiratory distress. He has no wheezes. He has rales. He exhibits no tenderness.  Abdominal: Soft. Bowel sounds are normal. He exhibits no distension. There is no tenderness. There is no rebound and no guarding.    Musculoskeletal: He exhibits edema (+1 left LE). He exhibits no tenderness.  Right AKA  Neurological: He is alert and oriented to person, place, and time. No cranial nerve deficit.  Skin: Skin is warm and dry. No rash noted. He is not diaphoretic. No erythema. No pallor.  Psychiatric: He has a normal mood and affect. His behavior is normal. Judgment and thought content normal.     Lab results: Basic Metabolic Panel:  Recent Labs  16/10/96 1130 05/24/13 1513  NA 140  --   K 6.0* 6.1*  CL 109  --   CO2 17*  --   GLUCOSE 208*  --   BUN 33*  --   CREATININE 1.64*  --   CALCIUM 8.5  --    CBC:  Recent Labs  05/24/13 1130  WBC 6.6  HGB 11.0*  HCT 33.3*  MCV 72.1*  PLT 104*     Imaging results:  Dg Chest 2 View  05/24/2013   CLINICAL DATA:  Chest pain  EXAM: CHEST  2 VIEW  COMPARISON:  07/14/2006  FINDINGS: The heart size and mediastinal contours are within normal limits. Both lungs are clear. The visualized skeletal structures are unremarkable.  IMPRESSION: No active cardiopulmonary disease.   Electronically Signed   By: Marlan Palau M.D.   On: 05/24/2013 11:00    Other results: EKG:   Date/Time: Tuesday May 24 2013 16:55:50 EST  Ventricular Rate: 61  PR Interval: 155  QRS Duration: 142  QT Interval: 429  QTC Calculation: 432  R Axis: -50  Text Interpretation: Sinus rhythm Right bundle branch block LVH by  voltage Since last tracing of earlier today No significant change was  Found.    Assessment: 68 year old man with past medical history of insulin dependent Type II DM, CKD Stage 3, HTN, and CAD s/p stenting who presented on 3/3 with chest pain and found to have hyperkalemia.   Plan:    Hyperkalemia - Pt presented with K of 6 without EKG changes most likely due to dietary indiscretion vs recently increased lisinopril dosage (12/2012) in setting  of CKD (however no worsening renal function from baseline). Pt received kayexalate in ED. -Repeat BMP -->  improved to 5.3 -Hold lisinopril (resume at lower 10 mg daily on discharge) -Novolog (70/30) 20 U  at night  -Start PO furosemide 40 mg daily (hypertensive) -Administer kayexalate as needed -Nutrition consult education regarding renal diet (low potassium, salt restricted diet) -Pt needs follow-up with [facility]   Atypical Chest Pain most likely due to untreated GERD - resolved. Etiology most likely due to GERD as pt not currently on PPI therapy at home and resolution after GI cocktail and PPI. Troponin and 12-lead EKG did not indicate ischemic changes. Pt does have CAD with stenting however symptoms not indicative of cardiac etiology.  -Protonix 40 mg daily and on discharge -Continue home 81 mg aspirin daily  -Pravastatin 20 mg daily (at home on pravastatin 20 mg daily)  -Pt to follow-up with cardiologist   Hypertension - currently hypertensive -Continue home amlodipine 10 mg daily  -Continue home carvedilol 12.5 mg daily  -Continue home terazosin 10 mg daily (for concomitant BPH) -Hold home lisinopril 20 mg daily (adjust to 10 mg daily) -Start furosemide 40 mg daily   CKD Stage 3 - Cr of 1.64 near baseline  -Monitor daily weight and volume status -Avoid nephrotoxins   Insulin Dependent Type II DM - Pt with last A1c of 6.0 on 12/31/12. Pt at home on Novolog (70/30) 30 U BID.  -Carb modified diet -Monitor glucose with meals and at bedtime -Novolog (70/30) 20 U BID    Diet: Carb modified DVT Ppx: SQ Heparin Code: Full  Dispo: Disposition is deferred at this time, awaiting improvement of current medical problems. Anticipated discharge in approximately 1 day.   The patient does have a current PCP Rocco Serene(Lawrence D Klima, MD) and [facility].  The patient does not have transportation limitations that hinder transportation to clinic appointments.  Signed: Otis BraceMarjan Alvenia Treese, MD 05/24/2013, 5:56 PM

## 2013-05-24 NOTE — ED Provider Notes (Signed)
CSN: [id number]     Arrival date & time 05/24/13  1015 History   First MD Initiated Contact with Patient 05/24/13 1138     Chief Complaint  Patient presents with  . Chest Pain     (Consider location/radiation/quality/duration/timing/severity/associated sxs/prior Treatment) Patient is a 68 y.o. male presenting with chest pain. The history is provided by the patient.  Chest Pain  He complains of intermittent, sharp chest pain that occurs both day and night, which feels like his prior heartburn, and last 10-15 minutes, then resolved spontaneously. There is no associated cough, shortness of breath, fever, chills, nausea, vomiting, weakness, or dizziness. He's been somewhat less active than usual because of the cold weather. He can walk, but prefers using a wheelchair, because his right leg prosthesis. He denies pain or swelling in the left leg. He is taking his medications, as prescribed. There are no other known modifying factors.  Past Medical History  Diagnosis Date  . 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 Hyperlipidemia LDL goal < 100 04/02/2006  . Essential hypertension 12/09/2006  . Coronary artery disease 04/02/2006    s/p 2 stents, specifics unknown   . Benign prostatic hypertrophy with nocturia 07/07/2006  . Chronic venous insufficiency 04/12/2010  . Obesity (BMI 30.0-34.9) 01/15/2012  . Constipation 05/25/2009    Intermittent   . Microcytic normochromic anemia 05/27/2006  . Seasonal allergic rhinitis  05/25/2009  . Degenerative joint disease involving multiple joints 02/02/2007  . Internal and external hemorrhoids without complication 08/20/2012   Past Surgical History  Procedure Laterality Date  . Pilonidal cyst excision    . Eye surgery      Laser  . Fracture surgery      Fractured stump   Family History  Problem Relation Age of Onset  . Hypertension Mother   . Heart attack Father   . Breast cancer Sister   . Arthritis Sister   . Heart attack Brother   . Diabetes Brother   . Stroke Brother   . Pneumonia Daughter   . Diabetes Brother   . Alcoholism Brother   . Diabetes Brother   . Arthritis Sister     Bilateral knee replacement  . HIV Daughter   . Hypertension Daughter   . Drug abuse Daughter   . Schizophrenia Daughter   . Hypertension Daughter   . Drug abuse Daughter   . Bipolar disorder Daughter    History  Substance Use Topics  . Smoking status: Former Smoker    Quit date: 06/16/1968  . Smokeless tobacco: Never Used  . Alcohol Use: No    Review of Systems  Cardiovascular: Positive for chest pain.  All other systems reviewed and are negative.      Allergies  Amoxicillin; Doxycycline; and Tamsulosin  Home Medications   Current Outpatient Rx  Name  Route  Sig  Dispense  Refill  . amLODipine (NORVASC) 10 MG tablet   Oral   Take 1 tablet (10 mg total) by mouth every morning.  30 tablet   6   . aspirin EC 81 MG tablet   Oral   Take 81 mg by mouth every morning.         . calcium citrate-vitamin D (CITRACAL+D) 315-200 MG-UNIT per tablet   Oral   Take 1 tablet by mouth daily.          . carvedilol (COREG) 12.5 MG tablet   Oral   Take 1 tablet (12.5 mg total) by mouth 2 (two) times daily with a meal.   60 tablet   6   . docusate sodium (COLACE) 100 MG capsule   Oral   Take 1 capsule (100 mg total) by mouth 2 (two) times daily as needed for constipation.   100 capsule   3   . fish oil-omega-3 fatty acids 1000 MG capsule   Oral    Take 1 g by mouth daily.         . fluticasone (FLONASE) 50 MCG/ACT nasal spray   Nasal   Place 2 sprays into the nose daily as needed for allergies.         . hydroxypropyl methylcellulose (ISOPTO TEARS) 2.5 % ophthalmic solution   Both Eyes   Place 2 drops into both eyes 4 (four) times daily as needed for dry eyes.         . insulin aspart protamine- aspart (NOVOLOG MIX 70/30) (70-30) 100 UNIT/ML injection   Subcutaneous   Inject 0.3 mLs (30 Units total) into the skin 2 (two) times daily with a meal.   10 mL   11   . Insulin Pen Needle (NOVOFINE) 30G X 8 MM MISC      Inject SQ as directed twice daily, use new pen needle for each injection   2 each   11   . lisinopril (PRINIVIL,ZESTRIL) 20 MG tablet   Oral   Take 1 tablet (20 mg total) by mouth every morning.   90 tablet   3   . Multiple Vitamins-Minerals (MULTIVITAMIN WITH MINERALS) tablet   Oral   Take 1 tablet by mouth daily.         . pravastatin (PRAVACHOL) 20 MG tablet   Oral   Take 1 tablet (20 mg total) by mouth every morning.   90 tablet   3   . terazosin (HYTRIN) 10 MG capsule   Oral   Take 10 mg by mouth at bedtime.         . pantoprazole (PROTONIX) 20 MG tablet   Oral   Take 1 tablet (20 mg total) by mouth daily.   30 tablet   0    BP 150/73  Pulse 58  Temp(Src) 98 F (36.7 C) (Oral)  Resp 10  Wt 215 lb (97.523 kg)  SpO2 100% Physical Exam  Nursing note and vitals reviewed. Constitutional: He is oriented to person, place, and time. He appears well-developed and well-nourished. No distress.  Appears younger than stated age  HENT:  Head: Normocephalic and atraumatic.  Right Ear: External ear normal.  Left Ear: External ear normal.  Eyes: Conjunctivae and EOM are normal. Pupils are equal, round, and reactive to light.  Neck: Normal range of motion and phonation normal. Neck supple.  Cardiovascular: Normal rate, regular rhythm, normal heart sounds and intact distal pulses.    Pulmonary/Chest: Effort normal and breath sounds normal. He exhibits no bony tenderness.  Abdominal: Soft. Normal appearance. There is no tenderness.  Musculoskeletal: Normal range of motion.  Right leg- above-knee AKA, with  prosthesis  Neurological: He is alert and oriented to person, place, and time. No cranial nerve deficit or sensory deficit. He exhibits normal muscle tone. Coordination normal.  Skin: Skin is warm, dry and intact.  Psychiatric: He has a normal mood and affect. His behavior is normal. Judgment and thought content normal.    ED Course  Procedures (including critical care time)  Medications  gi cocktail (Maalox,Lidocaine,Donnatal) (30 mLs Oral Given 05/24/13 1234)  pantoprazole (PROTONIX) EC tablet 40 mg (40 mg Oral Given 05/24/13 1234)  sodium polystyrene (KAYEXALATE) 15 GM/60ML suspension 30 g (30 g Oral Given 05/24/13 1602)    Patient Vitals for the past 24 hrs:  BP Temp Temp src Pulse Resp SpO2 Weight  05/24/13 1630 150/73 mmHg - - 58 10 100 % -  05/24/13 1600 139/74 mmHg - - 60 12 100 % -  05/24/13 1530 140/70 mmHg - - 62 13 100 % -  05/24/13 1430 122/67 mmHg - - 61 11 100 % -  05/24/13 1421 135/62 mmHg - - 81 15 99 % -  05/24/13 1330 122/70 mmHg - - 59 19 98 % -  05/24/13 1300 124/82 mmHg - - 63 20 100 % -  05/24/13 1230 124/78 mmHg - - 67 16 100 % -  05/24/13 1130 124/72 mmHg - - 65 21 100 % -  05/24/13 1022 164/59 mmHg 98 F (36.7 C) Oral 67 17 100 % 215 lb (97.523 kg)    5:06 PM Reevaluation with update and discussion. After initial assessment and treatment, an updated evaluation reveals he remains comfortable, no ectopy on cardiac monitor. Bishoy Cupp L     Labs Review Labs Reviewed  BASIC METABOLIC PANEL - Abnormal; Notable for the following:    Potassium 6.0 (*)    CO2 17 (*)    Glucose, Bld 208 (*)    BUN 33 (*)    Creatinine, Ser 1.64 (*)    GFR calc non Af Amer 41 (*)    GFR calc Af Amer 47 (*)    All other components within normal limits   CBC - Abnormal; Notable for the following:    Hemoglobin 11.0 (*)    HCT 33.3 (*)    MCV 72.1 (*)    MCH 23.8 (*)    Platelets 104 (*)    All other components within normal limits  POTASSIUM - Abnormal; Notable for the following:    Potassium 6.1 (*)    All other components within normal limits  I-STAT TROPOININ, ED   Imaging Review Dg Chest 2 View  05/24/2013   CLINICAL DATA:  Chest pain  EXAM: CHEST  2 VIEW  COMPARISON:  07/14/2006  FINDINGS: The heart size and mediastinal contours are within normal limits. Both lungs are clear. The visualized skeletal structures are unremarkable.  IMPRESSION: No active cardiopulmonary disease.   Electronically Signed   By: Marlan Palau M.D.   On: 05/24/2013 11:00     EKG Interpretation   Date/Time:  Tuesday May 24 2013 16:55:50 EST Ventricular Rate:  61 PR Interval:  155 QRS Duration: 142 QT Interval:  429 QTC Calculation: 432 R Axis:   -50 Text Interpretation:  Sinus rhythm Right bundle branch block LVH by  voltage Since last tracing of earlier today No significant change was  found Confirmed by Effie Shy  MD, Gryphon Vanderveen [phone]) on 05/24/2013 5:02:27 PM      MDM   Final diagnoses:  Gastritis  Hyperkalemia  Renal insufficiency    Gastritis as cause  of upper abdominal and chest pain. Incidental hyperkalemia associated with chronic renal insufficiency. He'll need to be admitted for stabilization..  Nursing Notes Reviewed/ Care Coordinated, and agree without changes. Applicable Imaging Reviewed.  Interpretation of Laboratory Data incorporated into ED treatment  Plan: Admit    Flint Melter, MD 05/24/13 [phone]

## 2013-05-24 NOTE — ED Notes (Signed)
Pt reports intermittent left side sharp chest pains x over one week. Denies any sob or nausea. ekg done at triage, no acute distress noted. Has cardiac hx.

## 2013-05-25 DIAGNOSIS — K219 Gastro-esophageal reflux disease without esophagitis: Secondary | ICD-10-CM

## 2013-05-25 DIAGNOSIS — I129 Hypertensive chronic kidney disease with stage 1 through stage 4 chronic kidney disease, or unspecified chronic kidney disease: Secondary | ICD-10-CM

## 2013-05-25 DIAGNOSIS — N183 Chronic kidney disease, stage 3 unspecified: Secondary | ICD-10-CM

## 2013-05-25 DIAGNOSIS — E119 Type 2 diabetes mellitus without complications: Secondary | ICD-10-CM

## 2013-05-25 DIAGNOSIS — E875 Hyperkalemia: Principal | ICD-10-CM

## 2013-05-25 DIAGNOSIS — R079 Chest pain, unspecified: Secondary | ICD-10-CM

## 2013-05-25 LAB — BASIC METABOLIC PANEL
BUN: 36 mg/dL — ABNORMAL HIGH (ref 6–23)
CALCIUM: 8 mg/dL — AB (ref 8.4–10.5)
CHLORIDE: 111 meq/L (ref 96–112)
CO2: 17 meq/L — AB (ref 19–32)
CREATININE: 1.62 mg/dL — AB (ref 0.50–1.35)
GFR calc Af Amer: 48 mL/min — ABNORMAL LOW (ref >90)
GFR calc non Af Amer: 41 mL/min — ABNORMAL LOW (ref >90)
GLUCOSE: 107 mg/dL — AB (ref 70–99)
Potassium: 4.6 mEq/L (ref 3.7–5.3)
Sodium: 142 mEq/L (ref 137–147)

## 2013-05-25 LAB — GLUCOSE, CAPILLARY
Glucose-Capillary: 89 mg/dL (ref 70–99)
Glucose-Capillary: 149 mg/dL — ABNORMAL HIGH (ref 70–99)

## 2013-05-25 LAB — CBC
HEMATOCRIT: 31 % — AB (ref 39.0–52.0)
Hemoglobin: 10.3 g/dL — ABNORMAL LOW (ref 13.0–17.0)
MCH: 23.9 pg — AB (ref 26.0–34.0)
MCHC: 33.2 g/dL (ref 30.0–36.0)
MCV: 71.9 fL — AB (ref 78.0–100.0)
PLATELETS: 97 10*3/uL — AB (ref 150–400)
RBC: 4.31 MIL/uL (ref 4.22–5.81)
RDW: 14.4 % (ref 11.5–15.5)
WBC: 5.7 10*3/uL (ref 4.0–10.5)

## 2013-05-25 MED ORDER — LISINOPRIL 10 MG PO TABS: 10.0000 mg | ORAL_TABLET | Freq: Every morning | ORAL | Status: DC

## 2013-05-25 MED ORDER — FUROSEMIDE 40 MG PO TABS: 40.0000 mg | ORAL_TABLET | Freq: Every day | ORAL | Status: DC

## 2013-05-25 MED ORDER — PANTOPRAZOLE SODIUM 40 MG PO TBEC: 40.0000 mg | DELAYED_RELEASE_TABLET | Freq: Every day | ORAL | Status: DC

## 2013-05-25 NOTE — Discharge Instructions (Signed)
-  Start taking 10 mg of Lisinopril instead of 20 mg daily -Start taking 40 mg of Lasix daily -Start taking 40 mg of Protonix daily -Please follow diet given by dietician regarding foods to avoid  -Please attend your follow-up appointment on Monday and schedule for cardiology  -Pleasure meeting you - hope you were satisfied with our care!

## 2013-05-25 NOTE — H&P (Signed)
INTERNAL MEDICINE TEACHING SERVICE Attending Admission Note  Date: 05/25/2013  Patient name: [name] [name]  Medical record number: [id number]  Date of birth: 09/29/1945    I have seen and evaluated [name] [name] and discussed their care with the Residency Team.  68 yr old man with hx Type 2 DM, CKD stage 3, HTN, CAD with stent placement hx, presented due to CP. He states that he's been having intermittent episodes of chest discomfort for 2 weeks that are different from the CP that required a coronary stent years ago. Denies CP on exertion. He states he's been having a sensation of fullness and burning that was not associated with SOB, diaphoresis, radiation to L arm, dizziness, diaphoresis, or palpitations. He states the GI cocktail given to him in the ED resolved his CP. Incidentally, he was noted to have a K of 6.0. EKG did not have evidence of peaked T waves. The patient admits to eating "a lot of bananas", he was educated on the effect of his diet on potassium.  On exam, Filed Vitals:   05/25/13 1345  BP: 136/71  Pulse: 69  Temp: 97.3 F (36.3 C)  Resp: 20   GEN: AAOx3, NAD. HEENT: EOMI, PERRLA, no adenopathy. CV: S1S2, no m/r/g, RRR PULM: CTA bilat. ABD/GI: Soft, NT, +BS, no guarding. LE/UE: 2+ pulses LLE, trace LLE edema. He has a right AKA.  At this time, his chest pain is likely noncardiac. He has responded to GI cocktail. Trop is negative. In this setting, I would not purse further cardiac workup. As far as his hyperkalemia, his K responded to tx with 30 g kayexalate. Agree with decreasing dose of lisinopril to 10 mg. May add lasix 20 mg daily at this time given his CKD and difficult to tx HTN. He is medically stable. [facility]. He will need repeat BMET at that time. Educate on diet (less bananas please).  Jonah BlueAlejandro Brance Dartt, DO, FACP Faculty [facility] 05/25/2013, 2:32 PM

## 2013-05-25 NOTE — Discharge Summary (Cosign Needed)
Physician Discharge Summary  Patient ID: [name] [name] MRN: [id number] DOB/AGE: 70/07/1945 70 y.o.  Admit date: 05/24/2013 Discharge date: 05/25/2013  Admission Diagnoses:  - Type 2 Diabetes Mellitus - Atypical Chest Pain most likely d/t untreated GERD - Hyperkalemia - Hypertension   Discharge Diagnoses:  - Type 2 Diabetes Mellitus - Hyperkalemia - Hypertension - GERD  Hospital Course by Problem List  [name] [name] is a 68 year old man with past medical history of insulin dependent Type II DM, CKD Stage 3, HTN, and CAD s/p stenting who presented on 05/24/2013 with chest pain and found to have high potassium levels.   1. Hyperkalemia The patient presented with potassium of 6 without EKG changes most likely due to dietary indiscretion vs recently increased lisinopril dosage (12/2012) in setting of CKD (however no worsening renal function from baseline). The patient received kayexalate in ED with resolution of his symptoms.His lisinopril was held as hyperkalemia is a potential side effect of this medication, and he will restart a lower dose when he leaves the hospital. He will start furosemide 40mg  upon discharge, as we are decreasing the dose of the lisinopril. He received Novolog (70/30) 20 U at night. Nutrition was consulted for education regarding renal diet (low potassium, salt restricted diet), and they provided educational materials. The patient will follow up in clinic next Monday.  2. Aypical Chest Pain most likely due to untreated GERD   This condition was most likely due to GERD as pt not currently on PPI therapy at home and resolution after GI cocktail and PPI. The patient's Troponin and 12-lead EKG did not indicate ischemic changes. Pt does have CAD with stenting however symptoms not indicative of cardiac etiology. He will receive Protonix 40 mg daily and on discharge and is encouraged to follow up with his cardiologist.   3. Hypertension  The patient was mildly hypertensive at  presentation, and his lisinopril was reduced from 20mg  daily to 10mg  daily due to hyperkalemia as described above. At discharge, the patient will continue home regimen of amlodipine 10 mg daily, carvedilol 12.5 mg daily, and terazosin 10 mg daily. In addition, he will start furosemide 40 mg daily.  4. Insulin Dependent Type II Diabetes Mellitus  The patient shows good diabetes control with a last A1c of 6.0 on 12/31/12. In the hospital he received a carb modified diet, so his dose of Novolog (70/30) was decreased to 20 U BID. His glucose was monitored at meals and bedtime. On discharge, pt will continue his home regimen of Novolog (70/30) 30 U BID.    He was discharged on March 4th upon resolution of his symptoms and instructed to follow up in the clinic on Monday, March 9th at 9:00AM.   Discharge Exam: Blood pressure 133/78, pulse 68, temperature 98.4 F (36.9 C), temperature source Oral, resp. rate 16, height 5\' 8"  (1.727 m), weight 96.163 kg (212 lb), SpO2 99.00%. General appearance: alert, cooperative and no distress Head: Normocephalic, without obvious abnormality, atraumatic Eyes: conjunctivae/corneas clear. PERRL, EOM's intact. Fundi benign. Neck: no adenopathy, no carotid bruit, no JVD, supple, symmetrical, trachea midline and thyroid not enlarged, symmetric, no tenderness/mass/nodules Resp: clear to auscultation bilaterally Chest wall: no tenderness Cardio: regular rate and rhythm, S1, S2 normal, no murmur, click, rub or gallop GI: soft, non-tender; bowel sounds normal; no masses,  no organomegaly Extremities: extremities normal, atraumatic, no cyanosis or edema and Right AKA  Pulses: 2+ and symmetric Skin: Skin color, texture, turgor normal. No rashes or lesions  Disposition: 01-Home or Self  Care   Future Appointments Provider Department Dept Phone   05/30/2013 9:00 AM Manuela Schwartz, MD [facility] [phone]   07/01/2013 10:15 AM Rocco Serene, MD [facility] [phone]       Medication List    TAKE these medications       pantoprazole 20 MG tablet  Commonly known as:  PROTONIX  Take 1 tablet (20 mg total) by mouth daily.      ASK your doctor about these medications       amLODipine 10 MG tablet  Commonly known as:  NORVASC  Take 1 tablet (10 mg total) by mouth every morning.     aspirin EC 81 MG tablet  Take 81 mg by mouth every morning.     calcium citrate-vitamin D 315-200 MG-UNIT per tablet  Commonly known as:  CITRACAL+D  Take 1 tablet by mouth daily.     carvedilol 12.5 MG tablet  Commonly known as:  COREG  Take 1 tablet (12.5 mg total) by mouth 2 (two) times daily with a meal.     docusate sodium 100 MG capsule  Commonly known as:  COLACE  Take 1 capsule (100 mg total) by mouth 2 (two) times daily as needed for constipation.     fish oil-omega-3 fatty acids 1000 MG capsule  Take 1 g by mouth daily.     fluticasone 50 MCG/ACT nasal spray  Commonly known as:  FLONASE  Place 2 sprays into the nose daily as needed for allergies.     hydroxypropyl methylcellulose 2.5 % ophthalmic solution  Commonly known as:  ISOPTO TEARS  Place 2 drops into both eyes 4 (four) times daily as needed for dry eyes.     insulin aspart protamine- aspart (70-30) 100 UNIT/ML injection  Commonly known as:  NOVOLOG MIX 70/30  Inject 0.3 mLs (30 Units total) into the skin 2 (two) times daily with a meal.     Insulin Pen Needle 30G X 8 MM Misc  Commonly known as:  NOVOFINE  Inject SQ as directed twice daily, use new pen needle for each injection     lisinopril 20 MG tablet  Commonly known as:  PRINIVIL,ZESTRIL  Take 1 tablet (20 mg total) by mouth every morning.     multivitamin with minerals tablet  Take 1 tablet by mouth daily.     pravastatin 20 MG tablet  Commonly known as:  PRAVACHOL  Take 1 tablet (20 mg total) by mouth every morning.     terazosin 10 MG capsule  Commonly known  as:  HYTRIN  Take 10 mg by mouth at bedtime.       Your appointment is scheduled at the clinic for 9:00 AM on Monday, March 9th.     Future Appointments Provider Department Dept Phone   05/30/2013 9:00 AM Manuela Schwartz, MD [facility] [phone]   07/01/2013 10:15 AM Rocco Serene, MD [facility] [phone]    Signed: Cordelro Gautreau W 05/25/2013, 11:44 AM

## 2013-05-25 NOTE — H&P (Signed)
I was present during the history of physical obtained from MS3, Benjamin W. Thompson and agree with his H & P. Please see my separate note as well.  

## 2013-05-25 NOTE — Plan of Care (Addendum)
Problem: Food- and Nutrition-Related Knowledge Deficit (NB-1.1) Goal: Nutrition education Formal process to instruct or train a patient/client in a skill or to impart knowledge to help patients/clients voluntarily manage or modify food choices and eating behavior to maintain or improve health. Outcome: Completed/Met Date Met:  05/25/13  RD consulted for renal diet education.   Patient is currently not on dialysis, however, provided Choose-A-Meal Booklet from [employer] to patient & wife. Reviewed food groups and provided written recommended serving sizes specifically determined for patient's current nutritional status.  Addressed foods to limit/avoid that are high potassium, sodium, and phosphorus.  We also discussed appropriate seasonings and tips for flavoring foods.  Expect good compliance.  Body mass index is 32.24 kg/(m^2). Pt meets criteria for Obesity Class I based on current BMI.  Current diet order is Carbohydrate Modified, patient is consuming approximately 100% of meals at this time. Labs and medications reviewed. No further nutrition interventions warranted at this time. If additional nutrition issues arise, please re-consult RD.  Arthur Holms, RD, LDN Pager #: [phone] After-Hours Pager #: [phone]

## 2013-05-25 NOTE — Discharge Summary (Signed)
Name: [name] [name] MRN: [id number] DOB: [birth year] 68 y.o. PCP: [name] Serene, MD  Date of Admission: 05/24/2013 11:02 AM Date of Discharge: 05/25/2013 Attending Physician: [name] Blue, DO  Discharge Diagnosis:  Primary: Hyperkalemia   Atypical Chest Pain most likely due to untreated GERD Hypertension   CKD Stage 3  Insulin Dependent Type II DM  DVT Prophylaxis      Discharge Medications:   Medication List         amLODipine 10 MG tablet  Commonly known as:  NORVASC  Take 1 tablet (10 mg total) by mouth every morning.     aspirin EC 81 MG tablet  Take 81 mg by mouth every morning.     calcium citrate-vitamin D 315-200 MG-UNIT per tablet  Commonly known as:  CITRACAL+D  Take 1 tablet by mouth daily.     carvedilol 12.5 MG tablet  Commonly known as:  COREG  Take 1 tablet (12.5 mg total) by mouth 2 (two) times daily with a meal.     docusate sodium 100 MG capsule  Commonly known as:  COLACE  Take 1 capsule (100 mg total) by mouth 2 (two) times daily as needed for constipation.     fish oil-omega-3 fatty acids 1000 MG capsule  Take 1 g by mouth daily.     fluticasone 50 MCG/ACT nasal spray  Commonly known as:  FLONASE  Place 2 sprays into the nose daily as needed for allergies.     furosemide 40 MG tablet  Commonly known as:  LASIX  Take 1 tablet (40 mg total) by mouth daily.     hydroxypropyl methylcellulose 2.5 % ophthalmic solution  Commonly known as:  ISOPTO TEARS  Place 2 drops into both eyes 4 (four) times daily as needed for dry eyes.     insulin aspart protamine- aspart (70-30) 100 UNIT/ML injection  Commonly known as:  NOVOLOG MIX 70/30  Inject 0.3 mLs (30 Units total) into the skin 2 (two) times daily with a meal.     Insulin Pen Needle 30G X 8 MM Misc  Commonly known as:  NOVOFINE  Inject SQ as directed twice daily, use new pen needle for each injection     lisinopril 10 MG tablet  Commonly known as:  PRINIVIL,ZESTRIL  Take 1 tablet  (10 mg total) by mouth every morning.     multivitamin with minerals tablet  Take 1 tablet by mouth daily.     pantoprazole 40 MG tablet  Commonly known as:  PROTONIX  Take 1 tablet (40 mg total) by mouth daily.     pravastatin 20 MG tablet  Commonly known as:  PRAVACHOL  Take 1 tablet (20 mg total) by mouth every morning.     terazosin 10 MG capsule  Commonly known as:  HYTRIN  Take 10 mg by mouth at bedtime.        Disposition and follow-up:   [name] [name] was discharged from [facility] in Good condition.  At the hospital follow up visit please address:  1.  Potassium level s/p medication and diet changes       Blood pressure - newly started lasix and adjusted lisinopril      Chest pain thought to be due to GERD  - newly started protonix - if compliant and effective              2.  Labs / imaging needed at time of follow-up:  BMP (Na, K)  3.  Pending labs/ test needing follow-up: none  Follow-up Appointments:     Follow-up Information   Follow up with [name] CowmanSCHOOLER, KAREN, MD On 05/30/2013. (9:00 AM)    Specialty:  Internal Medicine   Contact information:   [address] [place] [address] [phone]       Schedule an appointment as soon as possible for a visit with [name] MillersBrian Crenshaw, MD.   Specialty:  Cardiology   Contact information:   [address] [place] [address] [phone]       Discharge Instructions:  Future Appointments Provider Department Dept Phone   05/30/2013 9:00 AM Manuela SchwartzKaren-Akosua M Schooler, MD [facility] [phone]   07/01/2013 10:15 AM [name] SereneLawrence D Klima, MD [facility] [phone]      Consultations: none  Procedures Performed:  Dg Chest 2 View  05/24/2013   CLINICAL DATA:  Chest pain  EXAM: CHEST  2 VIEW  COMPARISON:  07/14/2006  FINDINGS: The heart size and mediastinal contours are within normal limits. Both lungs are clear. The visualized  skeletal structures are unremarkable.  IMPRESSION: No active cardiopulmonary disease.   Electronically Signed   By: [name] Palauharles  [name] M.D.   On: 05/24/2013 11:00    2D Echo: none  Cardiac Cath: none  Admission HPI: Original Author [name] BraceMarjan Rakhi Romagnoli, MD  [name] [name] is a 68 year old man with past medical history of insulin dependent Type II DM, CKD Stage 3, HTN, and CAD s/p stenting who presents with chest pain and found to have high potassium levels.   Pt reports that 2 weeks ago he began having intermittent (10-15 minutes) sharp chest pain described as heart burn without reflux symptoms that would resolve spontaneously without associated dyspnea, palpitations, diaphoresis, or lightheadedness. He is not on PPI therapy at home. He has history of CAD s/p stenting with no recent stress testing. He has regular follow-up with his cardiologist [name] [name].   In the ED he was given GI cocktail and protonix with resolution of his chest pain. He was also found to have potasium of 6 without EKG changes. He reports recently consuming butter milk, sweet potatoes, plums, and bananas (2-3 weeks) ago. He had recent increase in his lisinopril dosage from 10 to 20 mg daily in October. He had one episode of hyperkalemia in the past of 5.4 in 2013. He denies muscle cramping or weakness. He has chronic constipation and takes laxative as needed.    Hospital Course by problem list:   Hyperkalemia - Pt presented with potassium of 6 without EKG changes and negative troponin level most likely due to dietary indiscretion (consumed numerous bananas in past 2 weeks) vs recently increased lisinopril dosage (from 10 to 20 mg 12/2012) in setting of CKD (however no worsening renal function from baseline). Pt received 30 g of kayexalate in ED with normalization of potassium levels. Pt's home lisinopril was held during hospitalization and resumed at discharge at lower dosage of 10 mg daily. Pt received nutrition counseling from  dietician regarding limiting foods high in potassium, as well as sodium and phosphorus. Pt to follow-up with [facility] to check potassium levels after addition of new medication (furosemide) and lisinopril dosage change.      Atypical Chest Pain most likely due to untreated GERD - Pt presented with 2 week history of  intermittent sharp chest pain described as heart burn without reflux symptoms that would resolve spontaneously without associated symptoms. Pt is not on PPI therapy at home. He has  history of CAD s/p stenting and has regular follow-up with his cardiologist Dr. Ian Malkin. Etiology most likely non-cardiac in origin due to GERD as there was resolution of pain after GI cocktail and PPI. Pt remained chest pain free during remainder of hospitalization. Troponin and 12-lead EKG did not indicate ischemic changes. Pt was started on PO protonix 40 mg daily and instructed to continue on discharge with hospital follow-up to determine if long term therapy is warranted. Pt also received home 81 mg aspirin daily and simvastatin 10 mg daily (at home on pravastatin 20 mg daily). Pt was instructed to follow-up with his cardiologist for regular follow-up as well.    Hypertension - Pt with blood pressure range of 122/67 - 164/59 during hospitalization. Pt was continued on home home amlodipine 10 mg daily, carvedilol 12.5 mg daily, and terazosin 10 mg daily.  Pt's home lisinopril was held during hospitalization due to hyperkalemia later resumed on discharge at lower dosage of 10 mg daily. Pt was started on PO furosemide 40 mg daily during hospitalization which he was on in the past and also thought to help his chronic left lower extremity edema as well. Pt to have close hospital follow-up for blood pressure check and adjustments of medications as needed.    CKD Stage 3 - Pt with stable renal function near baseline creatinine during hospitalization. Pt's volume status was monitored and nephrotoxins were avoided during  hospitalization.   Insulin Dependent Type II DM - Pt with last A1c of 6.0 on 12/31/12. Pt at home on Novolog (70/30) 30 U BID. Pt received carb modified diet and  20 U Novolog (70/30) with blood sugar range of 89-208 during hospitalization.   DVT Prophylaxis - Pt received SQ heparin during hospitalization with no evidence of thrombosis or HIT syndrome.     Discharge Vitals:   BP 136/71  Pulse 69  Temp(Src) 97.3 F (36.3 C) (Oral)  Resp 20  Ht 5\' 8"  (1.727 m)  Wt 96.163 kg (212 lb)  BMI 32.24 kg/m2  SpO2 98%  Discharge Labs:  Results for orders placed during the hospital encounter of 05/24/13 (from the past 24 hour(s))  BASIC METABOLIC PANEL     Status: Abnormal   Collection Time    05/24/13  9:00 PM      Result Value Ref Range   Sodium 139  137 - 147 mEq/L   Potassium 5.3  3.7 - 5.3 mEq/L   Chloride 109  96 - 112 mEq/L   CO2 17 (*) 19 - 32 mEq/L   Glucose, Bld 182 (*) 70 - 99 mg/dL   BUN 37 (*) 6 - 23 mg/dL   Creatinine, Ser 1.61 (*) 0.50 - 1.35 mg/dL   Calcium 8.4  8.4 - 09.6 mg/dL   GFR calc non Af Amer 37 (*) >90 mL/min   GFR calc Af Amer 43 (*) >90 mL/min  GLUCOSE, CAPILLARY     Status: Abnormal   Collection Time    05/24/13  9:38 PM      Result Value Ref Range   Glucose-Capillary 166 (*) 70 - 99 mg/dL   Comment 1 Notify RN     Comment 2 Documented in Chart    CBC     Status: Abnormal   Collection Time    05/25/13  4:03 AM      Result Value Ref Range   WBC 5.7  4.0 - 10.5 K/uL   RBC 4.31  4.22 - 5.81 MIL/uL   Hemoglobin 10.3 (*) 13.0 -  17.0 g/dL   HCT 16.1 (*) 09.6 - 04.5 %   MCV 71.9 (*) 78.0 - 100.0 fL   MCH 23.9 (*) 26.0 - 34.0 pg   MCHC 33.2  30.0 - 36.0 g/dL   RDW 40.9  81.1 - 91.4 %   Platelets 97 (*) 150 - 400 K/uL  BASIC METABOLIC PANEL     Status: Abnormal   Collection Time    05/25/13  4:03 AM      Result Value Ref Range   Sodium 142  137 - 147 mEq/L   Potassium 4.6  3.7 - 5.3 mEq/L   Chloride 111  96 - 112 mEq/L   CO2 17 (*) 19 - 32 mEq/L     Glucose, Bld 107 (*) 70 - 99 mg/dL   BUN 36 (*) 6 - 23 mg/dL   Creatinine, Ser 7.82 (*) 0.50 - 1.35 mg/dL   Calcium 8.0 (*) 8.4 - 10.5 mg/dL   GFR calc non Af Amer 41 (*) >90 mL/min   GFR calc Af Amer 48 (*) >90 mL/min  GLUCOSE, CAPILLARY     Status: None   Collection Time    05/25/13  7:03 AM      Result Value Ref Range   Glucose-Capillary 89  70 - 99 mg/dL  GLUCOSE, CAPILLARY     Status: Abnormal   Collection Time    05/25/13 11:05 AM      Result Value Ref Range   Glucose-Capillary 149 (*) 70 - 99 mg/dL   Comment 1 Notify RN      Signed: Otis Brace, MD 05/25/2013, 6:20 PM   Time Spent on Discharge: 35 minutes Services Ordered on Discharge: none Equipment Ordered on Discharge: none

## 2013-05-25 NOTE — Progress Notes (Signed)
Subjective:  Pt seen and examined in AM. No acute events overnight. Pt has no complaints and reports no chest pain or symptoms of acid reflux, muscle weakness, or spasms.    Objective: Vital signs in last 24 hours: Filed Vitals:   05/24/13 1859 05/24/13 2013 05/25/13 0520 05/25/13 1345  BP: 156/88 155/74 133/78 136/71  Pulse: 63 65 68 69  Temp: 97.8 F (36.6 C) 97.8 F (36.6 C) 98.4 F (36.9 C) 97.3 F (36.3 C)  TempSrc: Oral Oral Oral Oral  Resp: 18 18 16 20   Height: 5\' 8"  (1.727 m)     Weight: 96.163 kg (212 lb)     SpO2: 99% 99% 99% 98%   Weight change:   Intake/Output Summary (Last 24 hours) at 05/25/13 1815 Last data filed at 05/25/13 1300  Gross per 24 hour  Intake    480 ml  Output      2 ml  Net    478 ml   Constitutional: He is oriented to person, place, and time. He appears well-developed and well-nourished. No distress.  HENT: EOMI Mouth/Throat: Oropharynx is clear and moist. No oropharyngeal exudate.  Eyes:  EOM are normal. .  Neck: Normal range of motion. Neck supple.  Cardiovascular: Normal rate, regular rhythm and normal heart sounds. No murmur heard.  Pulmonary/Chest: Effort normal. No respiratory distress. He has no wheezes. He exhibits no tenderness.  Abdominal: Soft. Bowel sounds are normal. He exhibits no distension. There is no tenderness. There is no rebound and no guarding.  Musculoskeletal: He exhibits edema (+1 left LE). He exhibits no tenderness. Right AKA  Neurological: He is alert and oriented to person, place, and time.   Skin: Skin is warm and dry. No rash noted. He is not diaphoretic. No erythema. No pallor.  Psychiatric: He has a normal mood and affect. His behavior is normal. Judgment and thought content normal   Lab Results: Basic Metabolic Panel:  Recent Labs Lab 05/24/13 2100 05/25/13 0403  NA 139 142  K 5.3 4.6  CL 109 111  CO2 17* 17*  GLUCOSE 182* 107*  BUN 37* 36*  CREATININE 1.77* 1.62*  CALCIUM 8.4 8.0*    CBC:  Recent Labs Lab 05/24/13 1130 05/25/13 0403  WBC 6.6 5.7  HGB 11.0* 10.3*  HCT 33.3* 31.0*  MCV 72.1* 71.9*  PLT 104* 97*   CBG:  Recent Labs Lab 05/24/13 2138 05/25/13 0703 05/25/13 1105  GLUCAP 166* 89 149*    Studies/Results: Dg Chest 2 View  05/24/2013   CLINICAL DATA:  Chest pain  EXAM: CHEST  2 VIEW  COMPARISON:  07/14/2006  FINDINGS: The heart size and mediastinal contours are within normal limits. Both lungs are clear. The visualized skeletal structures are unremarkable.  IMPRESSION: No active cardiopulmonary disease.   Electronically Signed   By: Marlan Palauharles  Clark M.D.   On: 05/24/2013 11:00   Medications: I have reviewed the patient's current medications. Scheduled Meds: Continuous Infusions: PRN Meds:.   Assessment/Plan:  Assessment: 68 year old man with past medical history of insulin dependent Type II DM, CKD Stage 3, HTN, and CAD s/p stenting who presented on 3/3 with chest pain and found to have hyperkalemia.   Plan:   Hyperkalemia - Resolved. K 4. 6 today  Pt presented with K of 6 without EKG changes most likely due to dietary indiscretion vs recently increased lisinopril dosage (12/2012) in setting of CKD (however no worsening renal function from baseline). Pt received kayexalate 30 g in ED.  -Repeat  BMP --> improved to 4.6  -Hold lisinopril (resume at lower 10 mg daily on discharge)  -Novolog (70/30) 20 U at night  -Continue PO furosemide 40 mg daily (hypertensive)  -Administer kayexalate as needed  -Nutrition consult education regarding diet (low potassium, salt restricted diet)  -Pt needs follow-up with [facility]   Atypical Chest Pain most likely due to untreated GERD - Resolved. Etiology most likely due to GERD as pt not currently on PPI therapy at home and resolution after GI cocktail and PPI. Troponin and 12-lead EKG did not indicate ischemic changes. Pt does have CAD with stenting however symptoms not indicative of cardiac etiology.  -Protonix  40 mg daily and on discharge  -Continue home 81 mg aspirin daily  -Pravastatin 20 mg daily (at home on pravastatin 20 mg daily)  -Pt to follow-up with cardiologist   Hypertension - currently normotensive -Continue home amlodipine 10 mg daily  -Continue home carvedilol 12.5 mg daily  -Continue home terazosin 10 mg daily (for concomitant BPH)  -Hold home lisinopril 20 mg daily (adjust to 10 mg daily on discharge)  -Continue furosemide 40 mg daily   CKD Stage 3 - Cr of 1.62 near baseline 1.6.-1.8 -Monitor daily weight and volume status  -Avoid nephrotoxins   Insulin Dependent Type II DM - BG 89-182. Pt with last A1c of 6.0 on 12/31/12. Pt at home on Novolog (70/30) 30 U BID.  -Carb modified diet  -Monitor glucose with meals and at bedtime  -Novolog (70/30) 20 U BID   Diet: Carb modified  DVT Ppx: SQ Heparin  Code: Full Dispo: Today   The patient does have a current PCP Rocco Serene, MD) and does need an [facility] hospital follow-up appointment after discharge.  The patient does not have transportation limitations that hinder transportation to clinic appointments.  .Services Needed at time of discharge: Y = Yes, Blank = No PT:   OT:   RN:   Equipment:   Other:     LOS: 1 day   Otis Brace, MD 05/25/2013, 6:15 PM

## 2013-05-25 NOTE — Progress Notes (Signed)
Discharged to home with family office visits in place teaching done  

## 2013-05-30 ENCOUNTER — Ambulatory Visit (INDEPENDENT_AMBULATORY_CARE_PROVIDER_SITE_OTHER): Payer: Medicare PPO | Admitting: Internal Medicine

## 2013-05-30 ENCOUNTER — Encounter: Payer: Self-pay | Admitting: Internal Medicine

## 2013-05-30 VITALS — BP 125/79 | HR 68 | Temp 97.8°F | Ht 68.0 in | Wt 212.7 lb

## 2013-05-30 DIAGNOSIS — E1149 Type 2 diabetes mellitus with other diabetic neurological complication: Secondary | ICD-10-CM

## 2013-05-30 DIAGNOSIS — I10 Essential (primary) hypertension: Secondary | ICD-10-CM

## 2013-05-30 DIAGNOSIS — E875 Hyperkalemia: Secondary | ICD-10-CM

## 2013-05-30 LAB — BASIC METABOLIC PANEL WITH GFR
BUN: 41 mg/dL — AB (ref 6–23)
CHLORIDE: 108 meq/L (ref 96–112)
CO2: 20 mEq/L (ref 19–32)
Calcium: 8.4 mg/dL (ref 8.4–10.5)
Creat: 1.73 mg/dL — ABNORMAL HIGH (ref 0.50–1.35)
GFR, Est African American: 45 mL/min — ABNORMAL LOW
GFR, Est Non African American: 39 mL/min — ABNORMAL LOW
Glucose, Bld: 141 mg/dL — ABNORMAL HIGH (ref 70–99)
Potassium: 4.8 mEq/L (ref 3.5–5.3)
SODIUM: 139 meq/L (ref 135–145)

## 2013-05-30 LAB — POCT GLYCOSYLATED HEMOGLOBIN (HGB A1C): Hemoglobin A1C: 6.5

## 2013-05-30 LAB — GLUCOSE, CAPILLARY: Glucose-Capillary: 136 mg/dL — ABNORMAL HIGH (ref 70–99)

## 2013-05-30 MED ORDER — AMLODIPINE BESYLATE 10 MG PO TABS: 10.0000 mg | ORAL_TABLET | Freq: Every morning | ORAL | Status: DC

## 2013-05-30 MED ORDER — CARVEDILOL 12.5 MG PO TABS: 12.5000 mg | ORAL_TABLET | Freq: Two times a day (BID) | ORAL | Status: DC

## 2013-05-30 NOTE — Discharge Summary (Signed)
  Date: 05/30/2013  Patient name: [name] [name]  Medical record number: [id number]  Date of birth: 02/14/1946   This patient has been seen and the plan of care was discussed with the house staff. Please see their note for complete details. I concur with their findings and plan.  Jonah BlueAlejandro Liyah Higham, DO, FACP [facility] 05/30/2013, 11:47 AM

## 2013-05-30 NOTE — Progress Notes (Signed)
   Subjective:    Patient ID: [name] [name], male    DOB: 04/17/1945, 68 y.o.   MRN: [id number]  HPI  [name] is a 68 yo male who presents for hospital follow-up for non-cardiac chest pain thought secondary to GERD and incidental finding of hyperkalemia. Hx is significant for Dm Type 2, hypertension, CKD Stage 3 and s/p AKA. He reports complete resolution of chest pain and states that previous to his hospitalization he had started eating ~5 banana a day along with a specialty smoothie of kale, spinich and carrots and thinks that this contributed to his high potassium and heartburn. Denies chest pain, shortness of breath, diarrhea, or constipation. Reports compliance with additional 5 mg of lisinopril and lasix 40 mg dialy which was added during his hospitalization.     Review of Systems  Constitutional: Negative.   HENT: Negative.   Respiratory: Negative.   Cardiovascular: Negative.   Gastrointestinal: Negative.   Endocrine: Negative.   Genitourinary: Negative.   Neurological: Negative.   Psychiatric/Behavioral: Negative.        Objective:   Physical Exam  Constitutional: He is oriented to person, place, and time. He appears well-developed and well-nourished. No distress.  HENT:  Head: Normocephalic and atraumatic.  Eyes: Conjunctivae and EOM are normal. Pupils are equal, round, and reactive to light.  Neck: Normal range of motion. Neck supple.  Cardiovascular: Normal rate, regular rhythm, normal heart sounds and intact distal pulses.   No murmur heard. Pulmonary/Chest: Effort normal and breath sounds normal. He has no wheezes.  Abdominal: Soft. Bowel sounds are normal. He exhibits no distension. There is no tenderness.  Musculoskeletal: Normal range of motion. He exhibits no edema.  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  Skin: Skin is warm and dry.  Psychiatric: He has a normal mood and affect.            Assessment & Plan:  See separate problem-list charting for details:  # non-cardiac chest pain: secondary to GERD, resolved -cont PPI tx  # GERD: managed with Protonix 40 mg qd  # hypertension: contolled and at goal on ACEi, BB, and loop diuretic  # DMT2: controlled with last HgbA1c 6.0 on Novolog 70/30 30 units bid  # CAD: no chest pain, to f/u with Dr. Ian [name]

## 2013-05-30 NOTE — Assessment & Plan Note (Addendum)
Lab Results  Component Value Date   HGBA1C 6.5 05/30/2013   HGBA1C 6.0 12/31/2012   HGBA1C 5.8 08/06/2012     Assessment: Diabetes control: good control (HgbA1C at goal) Progress toward A1C goal:  at goal Comments: s/p right AKA  Plan: Medications:  continue current medications Home glucose monitoring: Frequency:   Timing:   Instruction/counseling given: reminded to get eye exam, reminded to bring medications to each visit and discussed foot care Educational resources provided:   Self management tools provided:   Other plans: cont Novolog 70/30 30 units bid -foot exam today

## 2013-05-30 NOTE — Patient Instructions (Signed)
General Instructions: It was nice to meet you, [name] [name]. I am glad that your chest pain has gone away. Continue to take the heartburn medicine as instructed. Schedule your follow-up appointment with your heart doctor. We will check your potassium level today and give you a call if there needs to be a change in your medications. Follow-up with your Primary Doctor in 3 months.   Treatment Goals:  Goals (1 Years of Data) as of 05/30/13         As of Today 05/25/13 05/25/13 05/24/13 05/24/13     Blood Pressure    . Blood Pressure < 140/90  125/79 136/71 133/78 155/74 156/88     Result Component    . HEMOGLOBIN A1C < 7.0          . LDL CALC < 100            Progress Toward Treatment Goals:  Treatment Goal 05/30/2013  Hemoglobin A1C -  Blood pressure at goal    Self Care Goals & Plans:  Self Care Goal 12/31/2012  Manage my medications take my medicines as prescribed; bring my medications to every visit; refill my medications on time  Monitor my health check my feet daily; keep track of my blood glucose; bring my glucose meter and log to each visit  Eat healthy foods eat baked foods instead of fried foods; eat foods that are low in salt  Be physically active -  Other -    Home Blood Glucose Monitoring 12/31/2012  Check my blood sugar once a day  When to check my blood sugar before breakfast     Care Management & Community Referrals:  Referral 12/31/2012  Referrals made for care management support none needed  Referrals made to community resources none

## 2013-06-01 NOTE — Progress Notes (Signed)
Case discussed with Dr. Schooler soon after the resident saw the patient.  We reviewed the resident's history and exam and pertinent patient test results.  I agree with the assessment, diagnosis, and plan of care documented in the resident's note. 

## 2013-06-10 ENCOUNTER — Ambulatory Visit (INDEPENDENT_AMBULATORY_CARE_PROVIDER_SITE_OTHER): Payer: Medicare PPO | Admitting: Cardiology

## 2013-06-10 ENCOUNTER — Encounter: Payer: Self-pay | Admitting: Cardiology

## 2013-06-10 ENCOUNTER — Encounter: Payer: Self-pay | Admitting: *Deleted

## 2013-06-10 VITALS — BP 128/74 | HR 62 | Ht 68.0 in | Wt 210.8 lb

## 2013-06-10 DIAGNOSIS — E785 Hyperlipidemia, unspecified: Secondary | ICD-10-CM

## 2013-06-10 DIAGNOSIS — I2510 Atherosclerotic heart disease of native coronary artery without angina pectoris: Secondary | ICD-10-CM

## 2013-06-10 DIAGNOSIS — R079 Chest pain, unspecified: Secondary | ICD-10-CM

## 2013-06-10 DIAGNOSIS — R0789 Other chest pain: Secondary | ICD-10-CM | POA: Insufficient documentation

## 2013-06-10 DIAGNOSIS — I10 Essential (primary) hypertension: Secondary | ICD-10-CM

## 2013-06-10 NOTE — Assessment & Plan Note (Signed)
Continue statin. 

## 2013-06-10 NOTE — Assessment & Plan Note (Signed)
Symptoms atypical. Schedule nuclear study for risk stratification. 

## 2013-06-10 NOTE — Assessment & Plan Note (Signed)
Continue aspirin and statin. 

## 2013-06-10 NOTE — Assessment & Plan Note (Signed)
Blood pressure controlled. Continue present medications. 

## 2013-06-10 NOTE — Progress Notes (Signed)
HPI: Evaluate coronary disease; status post PCI of his right coronary artery with drug- eluting stent in February 2006.  An echocardiogram was also performed in December 2007, that showed normal LV function. An abdominal ultrasound in August 2006 showed no aneurysm. Carotid Dopplers in August of 2006 showed probable 0-39% stenosis. His most recent Myoview in Dec 2011 showed no ischemia or infarction.  His ejection fraction was 62%. I last saw him in Dec of 2011. Recently admitted with chest pain. Enzymes negative. Felt to be gastroesophageal reflux disease. Noted to be hyperkalemic because of increased vegetable intake. Patient had sharp chest pains not exertional. He denies exertional chest pain but has limited mobility. No dyspnea, pedal edema or syncope.  Current Outpatient Prescriptions  Medication Sig Dispense Refill  . amLODipine (NORVASC) 10 MG tablet Take 1 tablet (10 mg total) by mouth every morning.  30 tablet  6  . aspirin EC 81 MG tablet Take 81 mg by mouth every morning.      . calcium citrate-vitamin D (CITRACAL+D) 315-200 MG-UNIT per tablet Take 1 tablet by mouth daily.       . carvedilol (COREG) 12.5 MG tablet Take 1 tablet (12.5 mg total) by mouth 2 (two) times daily with a meal.  60 tablet  6  . docusate sodium (COLACE) 100 MG capsule Take 1 capsule (100 mg total) by mouth 2 (two) times daily as needed for constipation.  100 capsule  3  . fish oil-omega-3 fatty acids 1000 MG capsule Take 1 g by mouth daily.      . fluticasone (FLONASE) 50 MCG/ACT nasal spray Place 2 sprays into the nose daily as needed for allergies.      . furosemide (LASIX) 20 MG tablet Take 1 tablet (20 mg total) by mouth daily.  30 tablet  6  . furosemide (LASIX) 40 MG tablet Take 1 tablet (40 mg total) by mouth daily.  30 tablet  2  . hydroxypropyl methylcellulose (ISOPTO TEARS) 2.5 % ophthalmic solution Place 2 drops into both eyes 4 (four) times daily as needed for dry eyes.      . insulin aspart  protamine- aspart (NOVOLOG MIX 70/30) (70-30) 100 UNIT/ML injection Inject 0.3 mLs (30 Units total) into the skin 2 (two) times daily with a meal.  10 mL  11  . Insulin Pen Needle (NOVOFINE) 30G X 8 MM MISC Inject SQ as directed twice daily, use new pen needle for each injection  2 each  11  . lisinopril (PRINIVIL,ZESTRIL) 10 MG tablet Take 1 tablet (10 mg total) by mouth every morning.  90 tablet  3  . Multiple Vitamins-Minerals (MULTIVITAMIN WITH MINERALS) tablet Take 1 tablet by mouth daily.      . pantoprazole (PROTONIX) 40 MG tablet Take 1 tablet (40 mg total) by mouth daily.  30 tablet  2  . pravastatin (PRAVACHOL) 20 MG tablet Take 1 tablet (20 mg total) by mouth every morning.  90 tablet  3  . terazosin (HYTRIN) 10 MG capsule Take 10 mg by mouth at bedtime.       No current facility-administered medications for this visit.     Past Medical History  Diagnosis Date  . 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 Type 2 diabetes mellitus  with peripheral artery disease 05/27/2006    Absent pulses in the left foot   . 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 Hyperlipidemia LDL goal < 100 04/02/2006  . Essential hypertension 12/09/2006  . Coronary artery disease 04/02/2006    s/p 2 stents, specifics unknown   . Benign prostatic hypertrophy with nocturia 07/07/2006  . Chronic venous insufficiency 04/12/2010  . Obesity (BMI 30.0-34.9) 01/15/2012  . Constipation 05/25/2009    Intermittent   . Microcytic normochromic anemia 05/27/2006  . Seasonal allergic rhinitis 05/25/2009  . Degenerative joint disease involving multiple joints 02/02/2007  . Internal and  external hemorrhoids without complication 08/20/2012    Past Surgical History  Procedure Laterality Date  . Pilonidal cyst excision    . Eye surgery      Laser  . Fracture surgery      Fractured stump    History   Social History  . Marital Status: Married    Spouse Name: N/A    Number of Children: N/A  . Years of Education: N/A   Occupational History  . Not on file.   Social History Main Topics  . Smoking status: Former Smoker    Quit date: 06/16/1968  . Smokeless tobacco: Never Used  . Alcohol Use: No  . Drug Use: No  . Sexual Activity: Yes    Birth Control/ Protection: None   Other Topics Concern  . Not on file   Social History Narrative  . No narrative on file    ROS: no fevers or chills, productive cough, hemoptysis, dysphasia, odynophagia, melena, hematochezia, dysuria, hematuria, rash, seizure activity, orthopnea, PND, pedal edema, claudication. Remaining systems are negative.  Physical Exam: Well-developed well-nourished in no acute distress.  Skin is warm and dry.  Patient does not appear to be depressed. Back is normal HEENT is normal.  Neck is supple. Normal carotid upstroke with no bruits. Chest is clear to auscultation with normal expansion.  Cardiovascular exam is regular rate and rhythm. No murmurs rubs or gallops. Abdominal exam nontender or distended. No masses palpated. Extremities show no edema. S/P right AKA neuro grossly intact  ECG 05/24/2013-sinus rhythm, right bundle branch block, left ventricular hypertrophy.

## 2013-06-10 NOTE — Patient Instructions (Signed)
Your physician wants you to follow-up in: ONE YEAR [facility] You will receive a reminder letter in the mail two months in advance. If you don't receive a letter, please call our office to schedule the follow-up appointment.   Your physician has requested that you have a lexiscan myoview. For further information please visit [email or web address]. Please follow instruction sheet, as given.

## 2013-06-23 ENCOUNTER — Encounter: Payer: Self-pay | Admitting: Cardiology

## 2013-06-23 ENCOUNTER — Encounter (HOSPITAL_COMMUNITY): Payer: Medicare PPO

## 2013-06-28 ENCOUNTER — Ambulatory Visit (HOSPITAL_COMMUNITY): Payer: Medicare PPO | Attending: Cardiovascular Disease | Admitting: Radiology

## 2013-06-28 VITALS — BP 112/78 | HR 68 | Ht 68.0 in | Wt 208.0 lb

## 2013-06-28 DIAGNOSIS — I10 Essential (primary) hypertension: Secondary | ICD-10-CM | POA: Insufficient documentation

## 2013-06-28 DIAGNOSIS — R0609 Other forms of dyspnea: Secondary | ICD-10-CM | POA: Insufficient documentation

## 2013-06-28 DIAGNOSIS — Z8249 Family history of ischemic heart disease and other diseases of the circulatory system: Secondary | ICD-10-CM | POA: Insufficient documentation

## 2013-06-28 DIAGNOSIS — I2510 Atherosclerotic heart disease of native coronary artery without angina pectoris: Secondary | ICD-10-CM | POA: Insufficient documentation

## 2013-06-28 DIAGNOSIS — Z9861 Coronary angioplasty status: Secondary | ICD-10-CM | POA: Insufficient documentation

## 2013-06-28 DIAGNOSIS — R0989 Other specified symptoms and signs involving the circulatory and respiratory systems: Secondary | ICD-10-CM | POA: Insufficient documentation

## 2013-06-28 DIAGNOSIS — R079 Chest pain, unspecified: Secondary | ICD-10-CM | POA: Insufficient documentation

## 2013-06-28 DIAGNOSIS — E119 Type 2 diabetes mellitus without complications: Secondary | ICD-10-CM | POA: Insufficient documentation

## 2013-06-28 DIAGNOSIS — Z87891 Personal history of nicotine dependence: Secondary | ICD-10-CM | POA: Insufficient documentation

## 2013-06-28 MED ORDER — TECHNETIUM TC 99M SESTAMIBI GENERIC - CARDIOLITE
11.0000 | Freq: Once | INTRAVENOUS | Status: AC | PRN
  Administered 2013-06-28: 11 via INTRAVENOUS

## 2013-06-28 MED ORDER — TECHNETIUM TC 99M SESTAMIBI GENERIC - CARDIOLITE
33.0000 | Freq: Once | INTRAVENOUS | Status: AC | PRN
  Administered 2013-06-28: 33 via INTRAVENOUS

## 2013-06-28 MED ORDER — REGADENOSON 0.4 MG/5ML IV SOLN
0.4000 mg | Freq: Once | INTRAVENOUS | Status: AC
  Administered 2013-06-28: 0.4 mg via INTRAVENOUS

## 2013-06-28 NOTE — Progress Notes (Signed)
[facility] S[facility] NUCLEAR MED [address] [place], [place] [address] [phone]    Cardiology Nuclear Med Study  Aubery LappingWilliam Cadogan is a 68 y.o. male     MRN : [id number]     DOB: 02/06/1946  Procedure Date: 06/28/2013  Nuclear Med Background Indication for Stress Test:  Evaluation for Ischemia, Stent Patency, and Patient seen in hospital on 05-25-2013 for Chest Pain, enzymes negative History:  CAD, Stent RCA, Echo 2007 EF 60%, MPI 2011 EF 62% Cardiac Risk Factors: Family History - CAD, History of Smoking, Hypertension, IDDM, and Lipids  Symptoms:  Chest Pain   Nuclear Pre-Procedure Caffeine/Decaff Intake:  None > 12 hrs NPO After: 8:30am   Lungs:  clear O2 Sat: 97% on room air. IV 0.9% NS with Angio Cath:  22g  IV Site: R Antecubital x 1, tolerated well IV Started by:  Irean HongPatsy Edwards, RN  Chest Size (in):  46 Cup Size: n/a  Height: 5\' 8"  (1.727 m)  Weight:  208 lb (94.348 kg)  BMI:  Body mass index is 31.63 kg/(m^2). Tech Comments:  Took Coreg this am. Fasting CBG was 150 at 0730 with 1/2 dose Insulin this am. Irean HongPatsy Edwards, RN.    Nuclear Med Study 1 or 2 day study: 1 day  Stress Test Type:  Eugenie BirksLexiscan  Reading MD: N/A  Order Authorizing Provider:  Olga MillersBrian Crenshaw, MD  Resting Radionuclide: Technetium 1040m Sestamibi  Resting Radionuclide Dose: 11.0 mCi   Stress Radionuclide:  Technetium 7340m Sestamibi  Stress Radionuclide Dose: 33.0 mCi           Stress Protocol Rest HR: 68 Stress HR: 88  Rest BP: 112/78 Stress BP: 123/81  Exercise Time (min): n/a METS: n/a           Dose of Adenosine (mg):  n/a Dose of Lexiscan: 0.4 mg  Dose of Atropine (mg): n/a Dose of Dobutamine: n/a mcg/kg/min (at max HR)  Stress Test Technologist:  ChimesSharon Brooks, BS-ES  Nuclear Technologist:  Domenic PoliteStephen Carbone, CNMT     Rest Procedure:  Myocardial perfusion imaging was performed at rest 45 minutes following the intravenous administration of Technetium 8940m Sestamibi. Rest ECG:  NSR-[id number]  Stress Procedure:  The patient received IV Lexiscan 0.4 mg over 15-seconds.  Technetium 2740m Sestamibi injected at 30-seconds.  Quantitative spect images were obtained after a 45 minute delay.  During the infusion of Lexiscan, the patient complained of SOB.  This resolved in recovery.  Stress ECG: No significant change from baseline ECG  QPS Raw Data Images:  There is interference from nuclear activity from structures below the diaphragm. This does not affect the ability to read the study. Stress Images:  Decreased uptake in the basal and mid inferior and basal inferolateral walls secondary to subdiaphragmatic activity.  Rest Images:  Decreased uptake in the basal and mid inferior and basal inferolateral walls secondary to subdiaphragmatic activity. .  Subtraction (SDS):  No evidence of ischemia. Transient Ischemic Dilatation (Normal <1.22):  1.07 Lung/Heart Ratio (Normal <0.45):  0.37  Quantitative Gated Spect Images QGS EDV:  101 ml QGS ESV:  39 ml  Impression Exercise Capacity:  Lexiscan with no exercise. BP Response:  Normal blood pressure response. Clinical Symptoms:  There is dyspnea. ECG Impression:  No significant ST segment change suggestive of ischemia. Comparison with Prior Nuclear Study: No significant change from previous study  Overall Impression:  Normal stress nuclear study.      LV Ejection Fraction: 61%.  LV Wall Motion:  NL  LV Function; NL Wall Motion    Lars Masson 06/28/2013

## 2013-07-01 ENCOUNTER — Ambulatory Visit (INDEPENDENT_AMBULATORY_CARE_PROVIDER_SITE_OTHER): Payer: Medicare PPO | Admitting: Internal Medicine

## 2013-07-01 ENCOUNTER — Encounter: Payer: Self-pay | Admitting: Internal Medicine

## 2013-07-01 VITALS — BP 126/67 | HR 64 | Temp 97.0°F | Wt 211.8 lb

## 2013-07-01 DIAGNOSIS — K219 Gastro-esophageal reflux disease without esophagitis: Secondary | ICD-10-CM

## 2013-07-01 DIAGNOSIS — N183 Chronic kidney disease, stage 3 unspecified: Secondary | ICD-10-CM

## 2013-07-01 DIAGNOSIS — I10 Essential (primary) hypertension: Secondary | ICD-10-CM

## 2013-07-01 DIAGNOSIS — E785 Hyperlipidemia, unspecified: Secondary | ICD-10-CM

## 2013-07-01 DIAGNOSIS — I2510 Atherosclerotic heart disease of native coronary artery without angina pectoris: Secondary | ICD-10-CM

## 2013-07-01 DIAGNOSIS — N138 Other obstructive and reflux uropathy: Secondary | ICD-10-CM

## 2013-07-01 DIAGNOSIS — N401 Enlarged prostate with lower urinary tract symptoms: Secondary | ICD-10-CM

## 2013-07-01 DIAGNOSIS — R351 Nocturia: Secondary | ICD-10-CM

## 2013-07-01 DIAGNOSIS — E1129 Type 2 diabetes mellitus with other diabetic kidney complication: Secondary | ICD-10-CM

## 2013-07-01 DIAGNOSIS — E1122 Type 2 diabetes mellitus with diabetic chronic kidney disease: Secondary | ICD-10-CM

## 2013-07-01 LAB — POCT GLYCOSYLATED HEMOGLOBIN (HGB A1C): Hemoglobin A1C: 7.2

## 2013-07-01 LAB — BASIC METABOLIC PANEL WITH GFR
BUN: 51 mg/dL — ABNORMAL HIGH (ref 6–23)
CO2: 19 mEq/L (ref 19–32)
Calcium: 8.7 mg/dL (ref 8.4–10.5)
Chloride: 106 mEq/L (ref 96–112)
Creat: 1.89 mg/dL — ABNORMAL HIGH (ref 0.50–1.35)
GFR, Est African American: 41 mL/min — ABNORMAL LOW
GFR, Est Non African American: 35 mL/min — ABNORMAL LOW
Glucose, Bld: 98 mg/dL (ref 70–99)
Potassium: 4.6 mEq/L (ref 3.5–5.3)
Sodium: 137 mEq/L (ref 135–145)

## 2013-07-01 LAB — GLUCOSE, CAPILLARY: GLUCOSE-CAPILLARY: 119 mg/dL — AB (ref 70–99)

## 2013-07-01 NOTE — Progress Notes (Signed)
   Subjective:    Patient ID: [name] [name], male    DOB: 06/16/1945, 68 y.o.   MRN: [id number]  HPI  Please see the A&P for the status of the pt's chronic medical problems.  Review of Systems  Constitutional: Negative for activity change and appetite change.  HENT: Positive for postnasal drip. Negative for sore throat, trouble swallowing and voice change.   Respiratory: Negative for cough, choking, chest tightness, shortness of breath and wheezing.   Cardiovascular: Negative for chest pain and leg swelling.  Gastrointestinal: Negative for nausea, vomiting, abdominal pain, diarrhea, constipation and abdominal distention.  Endocrine: Negative for polyuria.  Genitourinary: Negative for frequency.  Musculoskeletal: Positive for back pain and myalgias. Negative for arthralgias, gait problem, joint swelling, neck pain and neck stiffness.  Skin: Positive for color change. Negative for rash and wound.       Darkening of hands and face (sun exposed areas).  He and his wife attribute this to the amlodipine which apparently has been reported to do this.  Allergic/Immunologic: Positive for environmental allergies.  Neurological: Negative for syncope, weakness and light-headedness.      Objective:   Physical Exam  Nursing note and vitals reviewed. Constitutional: He is oriented to person, place, and time. He appears well-developed and well-nourished. No distress.  HENT:  Head: Normocephalic and atraumatic.  Eyes: Conjunctivae are normal. Right eye exhibits no discharge. Left eye exhibits no discharge. No scleral icterus.  Cardiovascular: Normal rate, regular rhythm and normal heart sounds.  Exam reveals no gallop and no friction rub.   No murmur heard. Pulmonary/Chest: Effort normal and breath sounds normal. No respiratory distress. He has no wheezes. He has no rales.  Abdominal: Soft. Bowel sounds are normal. He exhibits no distension. There is no tenderness. There is no rebound and no  guarding.  Musculoskeletal: Normal range of motion. He exhibits tenderness. He exhibits no edema.  S/p right AKA. Right paraspinous muscle tenderness.  Neurological: He is alert and oriented to person, place, and time. He exhibits normal muscle tone.  Skin: Skin is warm and dry. He is not diaphoretic. No erythema. No pallor.  Psychiatric: He has a normal mood and affect. His behavior is normal. Judgment and thought content normal.      Assessment & Plan:   Please see problem oriented charting.

## 2013-07-01 NOTE — Assessment & Plan Note (Signed)
He was recently admitted to the hospital with hyperkalemia and chest pain. The chest pain was felt to be secondary to gastroesophageal reflux disease and he was started on PPI therapy. He followed up with his cardiologist as an outpatient and the decision was made to risk stratify with a nuclear stress test. This was recently completed and found to demonstrate no reversible ischemia. We will therefore continue his antihypertensives as outlined above, aspirin 81 mg by mouth daily, and simvastatin 20 mg by mouth daily.

## 2013-07-01 NOTE — Assessment & Plan Note (Signed)
His hemoglobin A1c today is 7.2. This is up from 6.5 approximately one month ago. At the last clinic visit, because of the frequent hypoglycemia, we had decreased his insulin to 30 units in the morning and 30 units in the evening. This markedly reduced the number of hypoglycemic episodes he has had. That being said, he has noted an increase, on occasion, in his morning blood sugars. When his blood sugar is greater than 200 in the morning he will give himself 40 units rather than 30 units of insulin. Despite doing this he has had no hypoglycemic episodes during the day. We therefore decided to continue the NovoLog 70/30 at 30 units twice a day. If his morning blood sugar is greater than 200 he will give himself 40 units in the morning but continue with the 30 units nightly dose. He is up-to-date on all of his other diabetes health maintenance issues. At followup we will repeat the hemoglobin A1c to determine if the above strategy is effective at keeping his A1c at 7 or slightly lower.

## 2013-07-01 NOTE — Assessment & Plan Note (Signed)
His symptoms of nocturia and frequency are markedly improved on the terazosin 10 mg by mouth each night. He is satisfied with this therapy and we will continue with the current dose.

## 2013-07-01 NOTE — Assessment & Plan Note (Signed)
11 months ago his LDL was 25 on pravastatin 20 mg by mouth daily. He is tolerating this medication well without myalgias and we will continue it at the current dose. At the followup visit he will be due for a repeat lipid panel.

## 2013-07-01 NOTE — Assessment & Plan Note (Signed)
He was recently started on Protonix 40 mg by mouth daily for chest pain that was felt to represent gastroesophageal reflux disease. Since beginning this medication he has noted no further episodes of chest pain. In addition, his wife has noted much less nocturnal cough. He wanted to stop the medication and take as needed medication for his acid reflux. We decided he can stop the protnix without throwing it away. If he had no further symptoms including nocturnal cough he can remain off the protonix. If that were the case, and he would have intermittent chest pain consistent with the reflux, he could take TUMS as needed. If the reflux should return, he was instructed to restart the protonix.

## 2013-07-01 NOTE — Assessment & Plan Note (Signed)
He is otherwise up-to-date on his preventative health care measures.

## 2013-07-01 NOTE — Assessment & Plan Note (Signed)
His blood pressure today was 126/67. This is at target. This is while he has been taking amlodipine 10 mg by mouth daily, carvedilol 12.5 mg by mouth twice a day, lisinopril 10 mg by mouth daily, and Terazosin 10 mg by mouth at bedtime. As he is tolerating this regimen well, we will continue these 4 medications at their current dose. Because of the recent episode of hyperkalemia that required hospitalization, we are checking a basic metabolic panel today to assure he is not hyperkalemic on the lisinopril once again. The results are pending at the time of this dictation.

## 2013-07-01 NOTE — Patient Instructions (Signed)
It was great to see you again.  You are doing a wonderful job caring for yourself.  1) Keep taking all of your medications as you are.  2) I checked your Hgb A1C and potassium today.  I will call you with the results.  3) I will see you back in 3 months, sooner if necessary.

## 2013-07-04 NOTE — Progress Notes (Signed)
BMP: K 4.6, BUN 51, Cr 1.89, eGFR 41  No hyperkalemia, persistent Stage III CKD with eGFR > 40.  Will continue current therapy and aggressive cardiovascular risk factor modification.

## 2013-08-22 ENCOUNTER — Other Ambulatory Visit (HOSPITAL_COMMUNITY): Payer: Self-pay | Admitting: Internal Medicine

## 2013-08-22 DIAGNOSIS — I872 Venous insufficiency (chronic) (peripheral): Secondary | ICD-10-CM

## 2013-08-23 ENCOUNTER — Other Ambulatory Visit (HOSPITAL_COMMUNITY): Payer: Self-pay | Admitting: Internal Medicine

## 2013-08-23 DIAGNOSIS — K219 Gastro-esophageal reflux disease without esophagitis: Secondary | ICD-10-CM

## 2013-08-31 NOTE — Telephone Encounter (Signed)
At the last visit, [name] was to stop this medication.  He would restart it if symptoms returned.  I tried calling him to see if he was still taking the medication.  I only received an unidentified answering machine.  A message was not left.  Will try to call him again to see if refilling this medication is appropriate.

## 2013-09-01 NOTE — Telephone Encounter (Signed)
Talked to pt's wife - stated pt is doing good w/o this medication and does not any problems w/acid relux at this time.

## 2013-09-08 ENCOUNTER — Ambulatory Visit (INDEPENDENT_AMBULATORY_CARE_PROVIDER_SITE_OTHER): Payer: Medicare PPO | Admitting: Internal Medicine

## 2013-09-08 ENCOUNTER — Encounter: Payer: Self-pay | Admitting: Internal Medicine

## 2013-09-08 VITALS — BP 114/68 | HR 90 | Temp 100.6°F | Ht 68.0 in | Wt 203.5 lb

## 2013-09-08 DIAGNOSIS — N452 Orchitis: Secondary | ICD-10-CM

## 2013-09-08 DIAGNOSIS — E1149 Type 2 diabetes mellitus with other diabetic neurological complication: Secondary | ICD-10-CM

## 2013-09-08 DIAGNOSIS — R319 Hematuria, unspecified: Secondary | ICD-10-CM

## 2013-09-08 DIAGNOSIS — N451 Epididymitis: Secondary | ICD-10-CM | POA: Insufficient documentation

## 2013-09-08 DIAGNOSIS — I10 Essential (primary) hypertension: Secondary | ICD-10-CM

## 2013-09-08 DIAGNOSIS — N390 Urinary tract infection, site not specified: Secondary | ICD-10-CM

## 2013-09-08 LAB — POCT URINALYSIS DIPSTICK
Bilirubin, UA: NEGATIVE
Glucose, UA: NEGATIVE
KETONES UA: NEGATIVE
NITRITE UA: NEGATIVE
PROTEIN UA: 100
Spec Grav, UA: 1.015
Urobilinogen, UA: 1
pH, UA: 6

## 2013-09-08 LAB — GLUCOSE, CAPILLARY: Glucose-Capillary: 205 mg/dL — ABNORMAL HIGH (ref 70–99)

## 2013-09-08 MED ORDER — INSULIN ASPART PROT & ASPART (70-30 MIX) 100 UNIT/ML ~~LOC~~ SUSP: SUBCUTANEOUS | Status: DC

## 2013-09-08 MED ORDER — LEVOFLOXACIN 500 MG PO TABS: 500.0000 mg | ORAL_TABLET | Freq: Every day | ORAL | Status: AC

## 2013-09-08 NOTE — Assessment & Plan Note (Signed)
Blood sugars running high recently after his 70/30 was reduced to 30 units BID following episodes of low blood sugars. Patient reports taking 30 units BID currently except for once or twice in the last month when he took 40 units in the morning. Patients wife states that his eating habits are quite inconsistent and that he eats a lot if it is his favorite food and eats very less if its not. Patient fasting blood sugars are all slightly elevated ranging from 140's to 190's range and his pre-supper values are all elevated ranging from 200's to 300's range. Discussed with the attending regarding further management.  Plans: Because of his increased evening values, will increase his AM dose to 35 units in AM. Continue 30 units in PM. As patient is diagnosed with acute epididymitis today, and we decided not adjust his insulin regimen aggressively. Follow up in a week (for his acute epididymitis) and will address his DM.

## 2013-09-08 NOTE — Assessment & Plan Note (Signed)
Well controlled.   PLans: Continue current regimen.

## 2013-09-08 NOTE — Assessment & Plan Note (Addendum)
Patient symptoms of fever, chills, urgency, dysuria, right sided testicular pain with physical exam findings of Temp of 100.6, tender right testes with enalarged and tender right epididymis of 5 days duration is suggestive of acute epididymitis. In the setting of previous history of recurrent epididymitis with no subsequent complications. No signs of testicular torsion or fournier's gangrene on physical exam Patient is clinically stable and requesting to be treated as an outpatient. Discussed with the attending regarding further management.  Plans: Check Urine analysis and cultures. Start empiric Levofloxacin 500 mg qd x 14 days. Follow up in a week to monitor progress. Follow up with surgery. Recommended to do hot sitz baths for the scrotum pain and swelling for about 20 minutes 2-3 times a day along with Tylenol prn for pain/fever. Recommended to go to the emergency immediately if he notices any sudden severe pain, high grade fevers, or other new symptoms. Patient and his wife expressed understanding of the instructions and the plan.  Addendum: 09/12/13: Cultures growing >100K colonies of E.coli sensitive to Levofloxacin. Called patient to discuss the cultures and to see how the patient is doing. Talked to his wife who stated that he is not having fever, chills any more. His scrotal swelling is improving gradually as is his appetite. She said "he is doing whole lot better". Recommended not to miss the appointment on 09/15/13 with me. Will follow up.

## 2013-09-08 NOTE — Patient Instructions (Addendum)
Start taking the Levofloxacin antibiotic as recommended. Take Insulin 35 units in the morning and 30 units in the evening. Call Dr. Brunilda PayorNesi office and make an appointment to be seen early next week.  Epididymitis Epididymitis is a swelling (inflammation) of the epididymis. The epididymis is a cord-like structure along the back part of the testicle. Epididymitis is usually, but not always, caused by infection. This is usually a sudden problem beginning with chills, fever and pain behind the scrotum and in the testicle. There may be swelling and redness of the testicle. DIAGNOSIS  Physical examination will reveal a tender, swollen epididymis. Sometimes, cultures are obtained from the urine or from prostate secretions to help find out if there is an infection or if the cause is a different problem. Sometimes, blood work is performed to see if your white blood cell count is elevated and if a germ (bacterial) or viral infection is present. Using this knowledge, an appropriate medicine which kills germs (antibiotic) can be chosen by your caregiver. A viral infection causing epididymitis will most often go away (resolve) without treatment. HOME CARE INSTRUCTIONS   Hot sitz baths for 20 minutes, 4 times per day, may help relieve pain.  Only take over-the-counter or prescription medicines for pain, discomfort or fever as directed by your caregiver.  Take all medicines, including antibiotics, as directed. Take the antibiotics for the full prescribed length of time even if you are feeling better.  It is very important to keep all follow-up appointments. SEEK IMMEDIATE MEDICAL CARE IF:   You have a fever.  You have pain not relieved with medicines.  You have any worsening of your problems.  Your pain seems to come and go.  You develop pain, redness, and swelling in the scrotum and surrounding areas. MAKE SURE YOU:   Understand these instructions.  Will watch your condition.  Will get help right  away if you are not doing well or get worse. Document Released: 03/07/2000 Document Revised: 06/02/2011 Document Reviewed: 01/25/2009 [facility] Patient Information 2015 [employer] [place]. This information is not intended to replace advice given to you by your health care Maximus Hoffert. Make sure you discuss any questions you have with your health care Kosha Jaquith.

## 2013-09-08 NOTE — Progress Notes (Signed)
Subjective:   Patient ID: [name] [name] male   DOB: 10/01/1945 68 y.o.   MRN: [id number]  HPI: Mr.Marquez [name] [name] is a 68 y.o. gentleman with H/O CAD s/p stenting x 2, HTN, DM-II, CKD stage 3 comes to the office with for right sided scrotal pain x 5 days, DM follow up.  Patient reports pain on the right side of the scrotum since 09/04/13, along with mild fever, chills, dysuria, urgency. Patient denies checking temperatures but reports having fevers and has been using tylenol and cold clothes as needed. Patient reports that the pain is located mainly in the right scrotum, but sometimes radiates back to the right groin. He also reports mild swelling of the right side of the scrotum along with slight redness of the the skin. He denies any nausea, vomiting, abdominal pain, back pain, SOB, chest pain, cough. He denies any sudden severe onset of scrotal pain.  Patient was hospitalized from 04/20/12 to 04/22/13 for an episode of acute epididymitis (R>L) and underwent full work up that included CT scan of abdmonen/pelvis, renal US, arterial/venous doppler studies of abdomen and vein. Patient was found to have bilateral epidimytis (R>L). After consulting with patient urologist (Dr. Brunilda PayorNesi) over the phone, patient was discharged home with Levofloxacin 500 mg qd for 3 weeks and a follow up with Dr. Brunilda PayorNesi. Patient was growing Proteus mirabilis in the urine culture during that admission sensitive to flouroquinolones. Patient denies any further episodes of such symptoms since that admission.  Patient denies any other complaints.  Past Medical History  Diagnosis Date  . 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 Type 2 diabetes mellitus with peripheral artery disease 05/27/2006    Absent pulses in  the left foot   . 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 Hyperlipidemia LDL goal < 100 04/02/2006  . Essential hypertension 12/09/2006  . Coronary artery disease 04/02/2006    s/p 2 stents, specifics unknown   . Benign prostatic hypertrophy with nocturia 07/07/2006  . Chronic venous insufficiency 04/12/2010  . Obesity (BMI 30.0-34.9) 01/15/2012  . Constipation 05/25/2009    Intermittent   . Microcytic normochromic anemia 05/27/2006  . Seasonal allergic rhinitis 05/25/2009  . Degenerative joint disease involving multiple joints 02/02/2007  . Internal and external hemorrhoids without complication 08/20/2012   Current Outpatient Prescriptions  Medication Sig Dispense Refill  . amLODipine (NORVASC) 10 MG tablet Take 1 tablet (10 mg total) by mouth every morning.  30 tablet  6  . aspirin EC 81 MG tablet Take 81 mg by mouth every morning.      . calcium citrate-vitamin D (CITRACAL+D) 315-200 MG-UNIT per tablet Take 1 tablet by mouth daily.       . carvedilol (COREG) 12.5 MG tablet Take 1 tablet (12.5 mg total) by mouth 2 (two) times daily with a meal.  60 tablet  6  . docusate sodium (COLACE) 100 MG capsule Take 1 capsule (100 mg total) by mouth 2 (two) times daily as needed for constipation.  100 capsule  3  . fish oil-omega-3 fatty acids 1000 MG capsule Take 1 g by mouth daily.      . fluticasone (FLONASE) 50 MCG/ACT nasal  spray Place 2 sprays into the nose daily as needed for allergies.      . furosemide (LASIX) 40 MG tablet Take 1 tablet (40 mg total) by mouth daily.  90 tablet  3  . hydroxypropyl methylcellulose (ISOPTO TEARS) 2.5 % ophthalmic solution Place 2 drops into both eyes 4 (four) times daily as needed for dry eyes.      . insulin aspart protamine- aspart (NOVOLOG MIX 70/30) (70-30) 100 UNIT/ML injection Take 35 units subcutaneously in the morning and 30 units  subcutaneously in the evening.  10 mL  11  . Insulin Pen Needle (NOVOFINE) 30G X 8 MM MISC Inject SQ as directed twice daily, use new pen needle for each injection  2 each  11  . levofloxacin (LEVAQUIN) 500 MG tablet Take 1 tablet (500 mg total) by mouth daily.  14 tablet  0  . lisinopril (PRINIVIL,ZESTRIL) 10 MG tablet Take 1 tablet (10 mg total) by mouth every morning.  90 tablet  3  . Multiple Vitamins-Minerals (MULTIVITAMIN WITH MINERALS) tablet Take 1 tablet by mouth daily.      . pantoprazole (PROTONIX) 40 MG tablet Take 1 tablet (40 mg total) by mouth daily.  30 tablet  2  . pravastatin (PRAVACHOL) 20 MG tablet Take 1 tablet (20 mg total) by mouth every morning.  90 tablet  3  . terazosin (HYTRIN) 10 MG capsule Take 10 mg by mouth at bedtime.       No current facility-administered medications for this visit.   Family History  Problem Relation Age of Onset  . Hypertension Mother   . Heart attack Father   . Breast cancer Sister   . Arthritis Sister   . Heart attack Brother   . Diabetes Brother   . Stroke Brother   . Pneumonia Daughter   . Diabetes Brother   . Alcoholism Brother   . Diabetes Brother   . Arthritis Sister     Bilateral knee replacement  . HIV Daughter   . Hypertension Daughter   . Drug abuse Daughter   . Schizophrenia Daughter   . Hypertension Daughter   . Drug abuse Daughter   . Bipolar disorder Daughter    History   Social History  . Marital Status: Married    Spouse Name: N/A    Number of Children: N/A  . Years of Education: N/A   Social History Main Topics  . Smoking status: Former Smoker    Quit date: 06/16/1968  . Smokeless tobacco: Never Used  . Alcohol Use: No  . Drug Use: No  . Sexual Activity: Yes    Birth Control/ Protection: None   Other Topics Concern  . None   Social History Narrative  . None   Review of Systems: Pertinent items are noted in HPI. Objective:  Physical Exam: Filed Vitals:   09/08/13 0923  BP: 114/68    Pulse: 90  Temp: 100.6 F (38.1 C)  TempSrc: Oral  Height: 5\' 8"  (1.727 m)  Weight: 203 lb 8 oz (92.307 kg)  SpO2: 98%   Constitutional: Vital signs reviewed.  Patient is an age appropriate looking, african [place] gentleman, sitting in a wheel chair, well-developed and well-nourished and is in no acute distress and cooperative with exam. Alert and oriented x3.  Head: Normocephalic and atraumatic Mouth: no erythema or exudates, MMM Eyes:Conjunctivae normal, No scleral icterus.  Neck: Supple.  Cardiovascular: RRR, S1 normal, S2 normal, no MRG Pulmonary/Chest: normal respiratory effort, CTAB, no wheezes, rales, or rhonchi  Abdominal: Soft. Non-tender, non-distended, bowel sounds are normal, no masses, organomegaly, or guarding present.  GU: no CVA tenderness Pelvic exam: Right side of the scrotum appears slightly swollen, slightly erythematois. Mild to moderate tenderness to palpation over the right testes. The head and the body of the epididymis was enlarged and tender to palpation. Pain along the vas deferens in the groin and right lower quadrant. No penis discharge or secretions. No signs of skin gangrene or testicular torsion on exam. No palable inguinal lymphadenopathy. Left testicle appears to be normal and no tenderness to palpation. Musculoskeletal: Right AKA. Neurological: A&O x3 Skin: Warm, dry and intact.  Psychiatric: Normal mood and affect.  Assessment & Plan:

## 2013-09-08 NOTE — Progress Notes (Signed)
Case discussed with Dr. Boggala at the time of the visit.  We reviewed the resident's history and exam and pertinent patient test results.  I agree with the assessment, diagnosis, and plan of care documented in the resident's note. 

## 2013-09-09 LAB — URINALYSIS, COMPLETE
BILIRUBIN URINE: NEGATIVE
CASTS: NONE SEEN
Crystals: NONE SEEN
Glucose, UA: NEGATIVE mg/dL
Ketones, ur: NEGATIVE mg/dL
Nitrite: NEGATIVE
Protein, ur: 100 mg/dL — AB
SPECIFIC GRAVITY, URINE: 1.016 (ref 1.005–1.030)
SQUAMOUS EPITHELIAL / LPF: NONE SEEN
UROBILINOGEN UA: 1 mg/dL (ref 0.0–1.0)
WBC, UA: >50 WBC/hpf — AB (ref <3)
pH: 5.5 (ref 5.0–8.0)

## 2013-09-12 LAB — URINE CULTURE: Colony Count: >=100000

## 2013-09-15 ENCOUNTER — Encounter: Payer: Self-pay | Admitting: Internal Medicine

## 2013-09-15 ENCOUNTER — Ambulatory Visit (INDEPENDENT_AMBULATORY_CARE_PROVIDER_SITE_OTHER): Payer: Medicare PPO | Admitting: Internal Medicine

## 2013-09-15 VITALS — BP 104/60 | HR 73 | Temp 97.3°F | Ht 68.0 in | Wt 200.6 lb

## 2013-09-15 DIAGNOSIS — N451 Epididymitis: Secondary | ICD-10-CM

## 2013-09-15 DIAGNOSIS — N452 Orchitis: Secondary | ICD-10-CM

## 2013-09-15 DIAGNOSIS — I872 Venous insufficiency (chronic) (peripheral): Secondary | ICD-10-CM

## 2013-09-15 DIAGNOSIS — I10 Essential (primary) hypertension: Secondary | ICD-10-CM

## 2013-09-15 DIAGNOSIS — E1149 Type 2 diabetes mellitus with other diabetic neurological complication: Secondary | ICD-10-CM

## 2013-09-15 LAB — LIPID PANEL
Cholesterol: 69 mg/dL (ref 0–200)
HDL: 15 mg/dL — AB (ref >39)
LDL CALC: 1 mg/dL (ref 0–99)
Total CHOL/HDL Ratio: 4.6 Ratio
Triglycerides: 267 mg/dL — ABNORMAL HIGH (ref <150)
VLDL: 53 mg/dL — ABNORMAL HIGH (ref 0–40)

## 2013-09-15 LAB — GLUCOSE, CAPILLARY: GLUCOSE-CAPILLARY: 95 mg/dL (ref 70–99)

## 2013-09-15 MED ORDER — FUROSEMIDE 40 MG PO TABS: ORAL_TABLET | ORAL | Status: DC

## 2013-09-15 NOTE — Patient Instructions (Signed)
Finish the antibiotics as instructed. Follow up in Dr. Brunilda PayorNesi in 2-4 weeks. Stop taking the Lasix. Check your weights every day and keep a log of your weights. If your notice more than 5 pounds weight gain in a week, please give us a call and start taking Lasix 40 mg once daily.

## 2013-09-15 NOTE — Progress Notes (Signed)
Subjective:   Patient ID: Aubery LappingWilliam Sieh male   DOB: 10/15/1945 68 y.o.   MRN: [id number]  HPI: Mr.Ramel Jethro Polingorwood is a 68 y.o. gentleman with H/O CAD s/p stenting x 2, HTN, DM-II, CKD stage 3 comes to the office for a follow up of his previous office visit.  I saw Mr.Ferns on 09/08/13 in the office. He was diagnosed with Acute Epididymitis of the right testes and was treated with Levofloxacin 500 mg qd x 14 days. His urine cultures are growing >100K of E.Coli sensitive to Levofloxacin. He reports that the scrotal pain, swelling, tenderness to palpation have all resolved. He denies any more fever, chills, fatigue and reports that his appetite has improved. He is still taking the Levofloxacin.   He reports that he feels dizzy occasionally when at home when he stands up from sitting position. He denies any chest pain, SOB, Orthopnea, PND, swelling of legs, headaches, falls. He denies any other symptoms.  Patient denies any other complaints.   Past Medical History  Diagnosis Date  . 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 Hyperlipidemia LDL goal < 100 04/02/2006  . Essential hypertension 12/09/2006  . Coronary artery disease 04/02/2006    s/p 2 stents, specifics unknown   . Benign prostatic hypertrophy with nocturia 07/07/2006  . Chronic venous insufficiency 04/12/2010  . Obesity (BMI 30.0-34.9) 01/15/2012  . Constipation 05/25/2009    Intermittent   . Microcytic normochromic anemia 05/27/2006  . Seasonal  allergic rhinitis 05/25/2009  . Degenerative joint disease involving multiple joints 02/02/2007  . Internal and external hemorrhoids without complication 08/20/2012   Current Outpatient Prescriptions  Medication Sig Dispense Refill  . amLODipine (NORVASC) 10 MG tablet Take 1 tablet (10 mg total) by mouth every morning.  30 tablet  6  . aspirin EC 81 MG tablet Take 81 mg by mouth every morning.      . calcium citrate-vitamin D (CITRACAL+D) 315-200 MG-UNIT per tablet Take 1 tablet by mouth daily.       . carvedilol (COREG) 12.5 MG tablet Take 1 tablet (12.5 mg total) by mouth 2 (two) times daily with a meal.  60 tablet  6  . docusate sodium (COLACE) 100 MG capsule Take 1 capsule (100 mg total) by mouth 2 (two) times daily as needed for constipation.  100 capsule  3  . fish oil-omega-3 fatty acids 1000 MG capsule Take 1 g by mouth daily.      . fluticasone (FLONASE) 50 MCG/ACT nasal spray Place 2 sprays into the nose daily as needed for allergies.      . furosemide (LASIX) 40 MG tablet Take 1 tablet (40 mg total) by mouth daily.  90 tablet  3  . hydroxypropyl methylcellulose (ISOPTO TEARS) 2.5 % ophthalmic solution Place 2 drops into both eyes 4 (four) times daily as needed for dry eyes.      . insulin aspart protamine- aspart (NOVOLOG MIX 70/30) (70-30) 100 UNIT/ML injection Take 35 units subcutaneously in the morning and 30 units  subcutaneously in the evening.  10 mL  11  . Insulin Pen Needle (NOVOFINE) 30G X 8 MM MISC Inject SQ as directed twice daily, use new pen needle for each injection  2 each  11  . levofloxacin (LEVAQUIN) 500 MG tablet Take 1 tablet (500 mg total) by mouth daily.  14 tablet  0  . lisinopril (PRINIVIL,ZESTRIL) 10 MG tablet Take 1 tablet (10 mg total) by mouth every morning.  90 tablet  3  . Multiple Vitamins-Minerals (MULTIVITAMIN WITH MINERALS) tablet Take 1 tablet by mouth daily.      . pantoprazole (PROTONIX) 40 MG tablet Take 1 tablet (40 mg total) by mouth daily.  30  tablet  2  . pravastatin (PRAVACHOL) 20 MG tablet Take 1 tablet (20 mg total) by mouth every morning.  90 tablet  3  . terazosin (HYTRIN) 10 MG capsule Take 10 mg by mouth at bedtime.       No current facility-administered medications for this visit.   Family History  Problem Relation Age of Onset  . Hypertension Mother   . Heart attack Father   . Breast cancer Sister   . Arthritis Sister   . Heart attack Brother   . Diabetes Brother   . Stroke Brother   . Pneumonia Daughter   . Diabetes Brother   . Alcoholism Brother   . Diabetes Brother   . Arthritis Sister     Bilateral knee replacement  . HIV Daughter   . Hypertension Daughter   . Drug abuse Daughter   . Schizophrenia Daughter   . Hypertension Daughter   . Drug abuse Daughter   . Bipolar disorder Daughter    History   Social History  . Marital Status: Married    Spouse Name: N/A    Number of Children: N/A  . Years of Education: N/A   Social History Main Topics  . Smoking status: Former Smoker    Quit date: 06/16/1968  . Smokeless tobacco: Never Used  . Alcohol Use: No  . Drug Use: No  . Sexual Activity: Yes    Birth Control/ Protection: None   Other Topics Concern  . None   Social History Narrative  . None   Review of Systems: Pertinent items are noted in HPI. Objective:  Physical Exam: Filed Vitals:   09/15/13 1015  BP: 104/60  Pulse: 73  Temp: 97.3 F (36.3 C)  TempSrc: Oral  Height: 5\' 8"  (1.727 m)  Weight: 200 lb 9.6 oz (90.992 kg)  SpO2: 100%   Constitutional: Vital signs reviewed. Patient is an age appropriate looking, african [place] gentleman, sitting in a wheel chair, well-developed and well-nourished and is in no acute distress and cooperative with exam.  Cardiovascular: RRR, S1 normal, S2 normal, no MRG  Pulmonary/Chest: normal respiratory effort, CTAB, no wheezes, rales, or rhonchi  Pelvic exam: No erythema, swelling, tenderness to palpation noted over the scrotum or right testes.  The head of the right epididymis was palpable and not tender to palpation. No Pain along the vas deferens in the groin and right lower quadrant. No penis discharge or secretions. Both the testes appear symmetrical. Musculoskeletal: Right AKA. Neurological: A&O x3  Psychiatric: Normal mood and affect  Assessment & Plan:

## 2013-09-15 NOTE — Progress Notes (Signed)
Case discussed with Dr. Boggala at the time of the visit.  We reviewed the resident's history and exam and pertinent patient test results.  I agree with the assessment, diagnosis, and plan of care documented in the resident's note. 

## 2013-09-15 NOTE — Assessment & Plan Note (Signed)
Clinically resolved. No more systemic symptoms or signs and physical exam of the scrotum is within normal limits. Urine cultures growing >100K colonies of E.Coli sensitive to Levofloxacin.  Plans: Complete the two week course of antibiotic therapy as planned. Recommended to follow up with Urology in 2-4 weeks.

## 2013-09-15 NOTE — Assessment & Plan Note (Addendum)
Slightly on the softer side.  Patient reporting occasional dizziness when he stands up from sitting position, none today. Patient does not have pedal edema or crackles on exam and clinically there is no daily need for lasix. Also clinically I suspect he is intravascularly volume depleted. Discussed with the attending.  Plans: Change Lasix to prn medication for weight gain >5lb/ week. Educated patient and his wife regarding checking his legs for any swelling, checking daily weights. Patient and his wife voiced understanding of the plan. Follow up in a month to recheck his BP and to check on his legs.

## 2013-09-22 ENCOUNTER — Telehealth: Payer: Self-pay | Admitting: Internal Medicine

## 2013-09-22 ENCOUNTER — Other Ambulatory Visit: Payer: Self-pay | Admitting: Internal Medicine

## 2013-09-22 DIAGNOSIS — E785 Hyperlipidemia, unspecified: Secondary | ICD-10-CM

## 2013-09-22 NOTE — Telephone Encounter (Signed)
Called patient and recommended to stop the Pravastatin for now. See my A/P under Hyperlipidemia for further details.

## 2013-09-22 NOTE — Assessment & Plan Note (Addendum)
Lipid panel reveals very low LDL of 1, total cholesterol of 69 and HDL cholesterol of 15. Debated for a couple of days whether to continue the statins or to stop the statins or to stop it for now and check Lipid panel every six months and restart for LDL>70. The literature search or the up-to-date has no topics or recommendations in a situation like this where the LDL is so low (1), which I have never seen before. The only study that is some what related in terms of lowering the LDL is: the JUPITER Trial where the crestor allocated patients attaining LDL <50 had a lower risk of cardiovascular events but there were statistically significant adverse events, hematuria, depression compared to the group with LDL >50. (But the Jupiter Trial was conducted in a cohort of apparently healthy men and women and the major limitation of the study is classification into two groups of LDL<50 and LDL>50 was done on a non-randomized fashion in a post-hoc analysis) Although there are no guidelines on what level of LDL to stop the statin treatment, I suspect statins in his case dropping the Total as well as HDL cholesterol, as the trend over the last few years suggest.  Plans: D/C Pravastatin for now. Check Lipid Panel in 6 months and consider resuming Pravastatin for LDL>70. Called patient and informed him not to take Pravastatin until instructed further.

## 2013-10-14 ENCOUNTER — Ambulatory Visit: Payer: Medicare PPO | Admitting: Internal Medicine

## 2013-11-11 ENCOUNTER — Encounter: Payer: Self-pay | Admitting: Internal Medicine

## 2013-11-11 ENCOUNTER — Ambulatory Visit (INDEPENDENT_AMBULATORY_CARE_PROVIDER_SITE_OTHER): Payer: Medicare PPO | Admitting: Internal Medicine

## 2013-11-11 VITALS — BP 142/77 | HR 65 | Temp 98.2°F | Wt 206.6 lb

## 2013-11-11 DIAGNOSIS — I2510 Atherosclerotic heart disease of native coronary artery without angina pectoris: Secondary | ICD-10-CM

## 2013-11-11 DIAGNOSIS — I798 Other disorders of arteries, arterioles and capillaries in diseases classified elsewhere: Secondary | ICD-10-CM

## 2013-11-11 DIAGNOSIS — Z0000 Encounter for general adult medical examination without abnormal findings: Secondary | ICD-10-CM

## 2013-11-11 DIAGNOSIS — E1151 Type 2 diabetes mellitus with diabetic peripheral angiopathy without gangrene: Secondary | ICD-10-CM

## 2013-11-11 DIAGNOSIS — I10 Essential (primary) hypertension: Secondary | ICD-10-CM

## 2013-11-11 DIAGNOSIS — K219 Gastro-esophageal reflux disease without esophagitis: Secondary | ICD-10-CM

## 2013-11-11 DIAGNOSIS — Z23 Encounter for immunization: Secondary | ICD-10-CM

## 2013-11-11 DIAGNOSIS — E1159 Type 2 diabetes mellitus with other circulatory complications: Secondary | ICD-10-CM

## 2013-11-11 LAB — GLUCOSE, CAPILLARY: GLUCOSE-CAPILLARY: 98 mg/dL (ref 70–99)

## 2013-11-11 LAB — POCT GLYCOSYLATED HEMOGLOBIN (HGB A1C): Hemoglobin A1C: 6.3

## 2013-11-11 MED ORDER — PRAVASTATIN SODIUM 10 MG PO TABS: 10.0000 mg | ORAL_TABLET | Freq: Every evening | ORAL | Status: DC

## 2013-11-11 NOTE — Assessment & Plan Note (Signed)
He denies any chest pain as of late. He's been compliant with his amlodipine, carvedilol, lisinopril, and aspirin. His pravastatin was recently stopped because of an excessively low LDL of 1. I am in favor of decreasing the pravastatin, previously at 20 mg daily. I asked him to restart the pravastatin at 10 mg by mouth each evening as I feel given his multiple cardiovascular risk factors a statin is indicated.

## 2013-11-11 NOTE — Assessment & Plan Note (Signed)
At the last visit we stopped the pantoprazole as he felt he no longer needed it. Since then he denies any recurrence of his chest pain, heartburn, or nocturnal cough. He was asked to take TUMS as needed should he have a return in his heartburn symptoms.

## 2013-11-11 NOTE — Progress Notes (Signed)
   Subjective:    Patient ID: [name] [name], male    DOB: 04/01/1945, 68 y.o.   MRN: [id number]  HPI  Please see the A&P for the status of the pt's chronic medical problems.  Review of Systems  Constitutional: Negative for fever, chills, activity change and appetite change.  Respiratory: Negative for shortness of breath and wheezing.   Cardiovascular: Negative for chest pain and leg swelling.  Gastrointestinal: Negative for abdominal pain.  Genitourinary: Negative for dysuria and testicular pain.      Objective:   Physical Exam  Nursing note and vitals reviewed. Constitutional: He is oriented to person, place, and time. He appears well-developed and well-nourished. No distress.  HENT:  Head: Normocephalic and atraumatic.  Eyes: Conjunctivae are normal. Right eye exhibits no discharge. Left eye exhibits no discharge. No scleral icterus.  Cardiovascular: Normal rate, regular rhythm and normal heart sounds.  Exam reveals no gallop and no friction rub.   No murmur heard. Pulmonary/Chest: Effort normal and breath sounds normal. No respiratory distress. He has no wheezes. He has no rales.  Abdominal: Soft. Bowel sounds are normal. He exhibits no distension. There is no tenderness. There is no rebound and no guarding.  Musculoskeletal: Normal range of motion. He exhibits no edema and no tenderness.  Neurological: He is alert and oriented to person, place, and time. He exhibits normal muscle tone.  Skin: Skin is warm and dry. No rash noted. He is not diaphoretic. No erythema.  Psychiatric: He has a normal mood and affect. His behavior is normal. Judgment and thought content normal.      Assessment & Plan:   Please see problem oriented charting.

## 2013-11-11 NOTE — Assessment & Plan Note (Signed)
His diabetes is well controlled on his insulin regimen which is 70/30 30 units in the morning and 30 units in the evening. He will on occasion take 40 units in the morning if his blood sugars are high but this is rare. Review of his sugar log reveals morning fasting sugars in the 100s and evening fasting sugars in the mid-200s. Despite this, his hemoglobin A1c today has fallen to 6.3. He denies hypoglycemic episodes lately and he is up-to-date on all of his diabetic preventative health maintenance. We will continue the insulin 70/30 at 30 units twice daily.

## 2013-11-11 NOTE — Assessment & Plan Note (Signed)
His blood pressure today was slightly elevated at 142/77. This is unusual for him as of late. It is unclear why his blood pressure is isolated today when it has not been for several recent visits. He currently is on amlodipine 10 mg by mouth daily, Coreg 12.5 mg by mouth twice daily, lisinopril 10 mg by mouth daily, and Terazosin 10 mg by mouth at bedtime. As this is an isolated elevation in his blood pressure and is mild we will continue the current regimen and reassess the blood pressure control at the followup visit. If the blood pressure remains elevated at followup we will consider adjusting his antihypertensive regimen by either increasing the carvedilol or the lisinopril.

## 2013-11-11 NOTE — Patient Instructions (Signed)
It was great to see you again.  You are doing a good job taking care of yourself.  1) I restarted the cholesterol medicine but at a lower dose (pravastatin 10 mg by mouth every night).  2) We gave you a flu shot today.  3) Keep taking your other medications as you are.  I will see you back in 3 months to check your diabetes, sooner if necessary.

## 2013-11-11 NOTE — Assessment & Plan Note (Signed)
He received the annual flu vaccination today. He is otherwise up to date on his health maintenance.

## 2013-11-22 ENCOUNTER — Other Ambulatory Visit: Payer: Self-pay | Admitting: Internal Medicine

## 2013-11-22 DIAGNOSIS — N183 Chronic kidney disease, stage 3 unspecified: Secondary | ICD-10-CM

## 2013-11-22 DIAGNOSIS — E1122 Type 2 diabetes mellitus with diabetic chronic kidney disease: Secondary | ICD-10-CM

## 2013-12-29 ENCOUNTER — Other Ambulatory Visit: Payer: Self-pay | Admitting: *Deleted

## 2013-12-29 DIAGNOSIS — I10 Essential (primary) hypertension: Secondary | ICD-10-CM

## 2013-12-29 MED ORDER — FLUTICASONE PROPIONATE 50 MCG/ACT NA SUSP: 2.0000 | Freq: Every day | NASAL | Status: DC | PRN

## 2013-12-29 MED ORDER — AMLODIPINE BESYLATE 10 MG PO TABS: 10.0000 mg | ORAL_TABLET | Freq: Every morning | ORAL | Status: DC

## 2013-12-29 NOTE — Telephone Encounter (Signed)
Has Nov appt with Dr Josem KaufmannKlima

## 2013-12-29 NOTE — Telephone Encounter (Signed)
Message left on home phone ID recording. 

## 2014-01-02 LAB — HM DIABETES EYE EXAM

## 2014-01-04 ENCOUNTER — Encounter: Payer: Self-pay | Admitting: *Deleted

## 2014-01-05 ENCOUNTER — Other Ambulatory Visit: Payer: Self-pay | Admitting: *Deleted

## 2014-01-05 DIAGNOSIS — I10 Essential (primary) hypertension: Secondary | ICD-10-CM

## 2014-01-05 MED ORDER — CARVEDILOL 12.5 MG PO TABS: 12.5000 mg | ORAL_TABLET | Freq: Two times a day (BID) | ORAL | Status: DC

## 2014-02-10 ENCOUNTER — Ambulatory Visit (INDEPENDENT_AMBULATORY_CARE_PROVIDER_SITE_OTHER): Payer: Medicare PPO | Admitting: Internal Medicine

## 2014-02-10 ENCOUNTER — Encounter: Payer: Self-pay | Admitting: Internal Medicine

## 2014-02-10 VITALS — BP 117/64 | HR 67 | Temp 98.2°F | Wt 207.0 lb

## 2014-02-10 DIAGNOSIS — Z0000 Encounter for general adult medical examination without abnormal findings: Secondary | ICD-10-CM

## 2014-02-10 DIAGNOSIS — Z23 Encounter for immunization: Secondary | ICD-10-CM

## 2014-02-10 DIAGNOSIS — N183 Chronic kidney disease, stage 3 (moderate): Secondary | ICD-10-CM

## 2014-02-10 DIAGNOSIS — I872 Venous insufficiency (chronic) (peripheral): Secondary | ICD-10-CM

## 2014-02-10 DIAGNOSIS — E1122 Type 2 diabetes mellitus with diabetic chronic kidney disease: Secondary | ICD-10-CM

## 2014-02-10 DIAGNOSIS — E113499 Type 2 diabetes mellitus with severe nonproliferative diabetic retinopathy without macular edema, unspecified eye: Secondary | ICD-10-CM

## 2014-02-10 DIAGNOSIS — N401 Enlarged prostate with lower urinary tract symptoms: Secondary | ICD-10-CM

## 2014-02-10 DIAGNOSIS — I10 Essential (primary) hypertension: Secondary | ICD-10-CM

## 2014-02-10 DIAGNOSIS — E11349 Type 2 diabetes mellitus with severe nonproliferative diabetic retinopathy without macular edema: Secondary | ICD-10-CM

## 2014-02-10 DIAGNOSIS — R351 Nocturia: Secondary | ICD-10-CM

## 2014-02-10 DIAGNOSIS — I2510 Atherosclerotic heart disease of native coronary artery without angina pectoris: Secondary | ICD-10-CM

## 2014-02-10 LAB — BASIC METABOLIC PANEL WITH GFR
BUN: 86 mg/dL — ABNORMAL HIGH (ref 6–23)
CALCIUM: 8.3 mg/dL — AB (ref 8.4–10.5)
CO2: 19 mEq/L (ref 19–32)
CREATININE: 2.58 mg/dL — AB (ref 0.50–1.35)
Chloride: 106 mEq/L (ref 96–112)
GFR, EST AFRICAN AMERICAN: 28 mL/min — AB
GFR, Est Non African American: 24 mL/min — ABNORMAL LOW
Glucose, Bld: 269 mg/dL — ABNORMAL HIGH (ref 70–99)
Potassium: 5.6 mEq/L — ABNORMAL HIGH (ref 3.5–5.3)
SODIUM: 136 meq/L (ref 135–145)

## 2014-02-10 LAB — POCT GLYCOSYLATED HEMOGLOBIN (HGB A1C): HEMOGLOBIN A1C: 7.1

## 2014-02-10 LAB — GLUCOSE, CAPILLARY: GLUCOSE-CAPILLARY: 271 mg/dL — AB (ref 70–99)

## 2014-02-10 MED ORDER — INSULIN ASPART PROT & ASPART (70-30 MIX) 100 UNIT/ML PEN: 30.0000 [IU] | PEN_INJECTOR | Freq: Two times a day (BID) | SUBCUTANEOUS | Status: DC

## 2014-02-10 MED ORDER — FUROSEMIDE 40 MG PO TABS: ORAL_TABLET | ORAL | Status: DC

## 2014-02-10 NOTE — Assessment & Plan Note (Signed)
He was willing to take the PCV-13 pneumococcal vaccination today. This was therefore administered. He is otherwise up-to-date on his health care maintenance.

## 2014-02-10 NOTE — Assessment & Plan Note (Signed)
His hemoglobin A1c this morning was 7.1, up from 6.3, 3 months ago. This is while taking NovoLog 70/30 flex pen 30 units twice daily. When we explored possible explanations for this deterioration in his diabetic control we found that he had some extra halloween candy lying around. He particularly liked the butterfingers and the Tootsie Roll's. He has some grandchildren who will be visiting during Thanksgiving and I encouraged him to give the candy to them so that he would not be tempted to eat the left over halloween candy that was lying around. He denies any hypoglycemic episodes and I suspect his diabetic control will be within target at the follow-up visit as long as he is able to get rid of the sweets lying around his house. We will continue with the NovoLog 70/30 flex pen 30 units twice daily. A urine microalbumin was obtained today and is pending at the time of this dictation. He is otherwise up-to-date on his diabetic health care maintenance.

## 2014-02-10 NOTE — Assessment & Plan Note (Signed)
His blood pressure this morning is 117/64, which is well within target. This is on amlodipine 10 mg by mouth daily, carvedilol 12.5 mg by mouth twice daily, lisinopril 10 mg by mouth daily, Terazosin 10 mg by mouth at bedtime, and Lasix 40 mg by mouth as needed. He is tolerating this regimen well and will be continued at the current doses. A basic metabolic panel was obtained to assess his electrolytes and kidney function and is pending at the time of this dictation. A urine microalbumin was also obtained and is pending.

## 2014-02-10 NOTE — Assessment & Plan Note (Signed)
He is without any complaints on the terazosin 10 mg by mouth each night. This will be continued at the current dose.

## 2014-02-10 NOTE — Patient Instructions (Signed)
It was good to see you again.  1) Hold the pravastatin until I get your blood test back.  I will call you with the results next week.  2) Keep taking all of your other medications like you are.  3) Give away the tootsie rolls and butterfingers from halloween so you are not tempted.  4) We gave you the new pneumonia shot today.  I will see you back in 3 months, sooner if necessary.

## 2014-02-10 NOTE — Assessment & Plan Note (Addendum)
He denies any chest pain on the aspirin 81 mg by mouth daily, amlodipine 10 mg by mouth daily, carvedilol 12.5 mg by mouth daily, and lisinopril 10 mg by mouth daily. We will therefore continue this regimen. We have held the pravastatin at this time as he feels his upper extremity weakness began immediately after restarting the statin therapy. He denies any muscle tenderness but does have some fatigue. A TSH was obtained to make sure that the fatigue and weakness was not related to hypothyroidism. The results are pending at the time of this dictation, but if return normal we will continue to hold the pravastatin as hyperlipidemia has not been an issue for him.

## 2014-02-10 NOTE — Progress Notes (Signed)
   Subjective:    Patient ID: [name] [name], male    DOB: 07/18/1945, 68 y.o.   MRN: [id number]  HPI  Please see the A&P for the status of the pt's chronic medical problems.  Review of Systems  Constitutional: Positive for fatigue. Negative for activity change, appetite change and unexpected weight change.  HENT: Positive for postnasal drip.   Respiratory: Negative for cough, chest tightness, shortness of breath and wheezing.   Cardiovascular: Negative for chest pain, palpitations and leg swelling.  Gastrointestinal: Negative for nausea, vomiting, abdominal pain, diarrhea, constipation and abdominal distention.  Musculoskeletal: Negative for myalgias, back pain, joint swelling, arthralgias, gait problem, neck pain and neck stiffness.  Skin: Negative for color change, rash and wound.  Neurological: Positive for weakness. Negative for dizziness, syncope, light-headedness, numbness and headaches.       Weakness in the arms in the morning.  No weakness in the legs.  Psychiatric/Behavioral: Negative for confusion, dysphoric mood and agitation. The patient is not nervous/anxious.       Objective:   Physical Exam  Constitutional: He is oriented to person, place, and time. He appears well-developed and well-nourished. No distress.  HENT:  Head: Normocephalic and atraumatic.  Eyes: Conjunctivae are normal. Right eye exhibits no discharge. Left eye exhibits no discharge. No scleral icterus.  Cardiovascular: Normal rate, regular rhythm and normal heart sounds.  Exam reveals no gallop and no friction rub.   No murmur heard. Pulmonary/Chest: Effort normal and breath sounds normal. No respiratory distress. He has no wheezes. He has no rales.  Abdominal: Soft. Bowel sounds are normal. He exhibits no distension. There is no tenderness. There is no rebound and no guarding.  Musculoskeletal: Normal range of motion. He exhibits no edema or tenderness.  Neurological: He is alert and oriented to person,  place, and time. He exhibits normal muscle tone.  Skin: Skin is warm and dry. No rash noted. He is not diaphoretic. No erythema.  Psychiatric: He has a normal mood and affect. His behavior is normal. Judgment and thought content normal.  Nursing note and vitals reviewed.     Assessment & Plan:   Please see problem oriented charting.

## 2014-02-11 LAB — CBC
HCT: 32.6 % — ABNORMAL LOW (ref 39.0–52.0)
Hemoglobin: 10.4 g/dL — ABNORMAL LOW (ref 13.0–17.0)
MCH: 23.4 pg — ABNORMAL LOW (ref 26.0–34.0)
MCHC: 31.9 g/dL (ref 30.0–36.0)
MCV: 73.4 fL — ABNORMAL LOW (ref 78.0–100.0)
PLATELETS: 122 10*3/uL — AB (ref 150–400)
RBC: 4.44 MIL/uL (ref 4.22–5.81)
RDW: 16.2 % — ABNORMAL HIGH (ref 11.5–15.5)
WBC: 7.1 10*3/uL (ref 4.0–10.5)

## 2014-02-11 LAB — TSH: TSH: 1.983 u[IU]/mL (ref 0.350–4.500)

## 2014-02-13 NOTE — Progress Notes (Signed)
BMP: K 5.6, Cl 106, HCO3 19, AG 11, BUN 86, Cr 2.58, eGFR 28, Glucose 289  CBC: Hgb 104, Hct 32.6, Plt 122, WBC 7.1, MCV 73.4  TSH: 1.983  Deterioration in renal function.  It is unclear if this is secondary to progression of his diabetic nephropathy, a result of a vascular injury to the renal artery, post-obstructive renal failure given his h/o BPH, a result of ACEI, or lab error/hemolysis (would only account for hyperkalemia).  Will repeat the BMP tomorrow morning and if still abnormal will hold the ACEI as there is no role for ACEI in stage IV renal failure, especially with an elevation in the potassium.  We will also obtain a renal ultrasound to assess for evidence of hydronephrosis.  I did call the patient to inform him.  In the past he has given me permission to discuss health issues with his wife and I spoke with her (he was not immediately available).  She states that he has been eating more bananas and citrus then usual.  She will bring him into the clinic tomorrow morning for a stat BMP and I will make further decisions on how to proceed pending the result as outlined above.  The anemia is unchanged and is likely secondary to anemia of chronic renal failure.  He has no evidence of hypothyroidism.

## 2014-02-13 NOTE — Addendum Note (Signed)
Addended by: Doneen PoissonKLIMA, Aissata Wilmore D on: 02/13/2014 02:56 PM   Modules accepted: Orders

## 2014-02-14 ENCOUNTER — Other Ambulatory Visit (INDEPENDENT_AMBULATORY_CARE_PROVIDER_SITE_OTHER): Payer: Medicare PPO

## 2014-02-14 ENCOUNTER — Other Ambulatory Visit: Payer: Self-pay | Admitting: Internal Medicine

## 2014-02-14 DIAGNOSIS — E1122 Type 2 diabetes mellitus with diabetic chronic kidney disease: Secondary | ICD-10-CM

## 2014-02-14 DIAGNOSIS — N183 Chronic kidney disease, stage 3 unspecified: Secondary | ICD-10-CM

## 2014-02-14 DIAGNOSIS — N401 Enlarged prostate with lower urinary tract symptoms: Secondary | ICD-10-CM

## 2014-02-14 DIAGNOSIS — I10 Essential (primary) hypertension: Secondary | ICD-10-CM

## 2014-02-14 DIAGNOSIS — R351 Nocturia: Principal | ICD-10-CM

## 2014-02-14 LAB — BASIC METABOLIC PANEL WITH GFR
BUN: 107 mg/dL — ABNORMAL HIGH (ref 6–23)
CALCIUM: 8.8 mg/dL (ref 8.4–10.5)
CO2: 22 mEq/L (ref 19–32)
Chloride: 103 mEq/L (ref 96–112)
Creat: 2.79 mg/dL — ABNORMAL HIGH (ref 0.50–1.35)
GFR, EST AFRICAN AMERICAN: 25 mL/min — AB
GFR, EST NON AFRICAN AMERICAN: 22 mL/min — AB
GLUCOSE: 209 mg/dL — AB (ref 70–99)
Potassium: 5.1 mEq/L (ref 3.5–5.3)
SODIUM: 139 meq/L (ref 135–145)

## 2014-02-14 NOTE — Progress Notes (Signed)
BMP: K 5.1, BUN 107, Cr 2.79, glucose 209  eGFR 25  eGFR essentially unchanged from late last week, but is a deterioration from his recent baseline.  Will discontinue the lisinopril, ask that he use the lasix very sparingly, and schedule him for a renal ultrasound to rule out chronic urinary obstruction as a cause of his slow deterioration.  I called the patient and again spoke to his wife with these instructions and plan.  She will communicate this to him.  Once I get the renal ultrasound result back I will recheck the BMP off of the lisinopril to see if there has been an improvement in his eGFR,

## 2014-02-20 ENCOUNTER — Ambulatory Visit (HOSPITAL_COMMUNITY): Payer: Medicare PPO

## 2014-03-07 ENCOUNTER — Telehealth: Payer: Self-pay | Admitting: *Deleted

## 2014-03-07 NOTE — Telephone Encounter (Signed)
Follow up call made to patient-was a no show for renal u/s appt scheduled for 02/20/14. Pt's wife stated they were not aware of the appt and asked that it be rescheduled for after Christmas.  New order was placed

## 2014-03-10 ENCOUNTER — Other Ambulatory Visit: Payer: Self-pay | Admitting: Internal Medicine

## 2014-03-10 DIAGNOSIS — N183 Chronic kidney disease, stage 3 (moderate): Principal | ICD-10-CM

## 2014-03-10 DIAGNOSIS — E1122 Type 2 diabetes mellitus with diabetic chronic kidney disease: Secondary | ICD-10-CM

## 2014-03-11 ENCOUNTER — Encounter (HOSPITAL_COMMUNITY): Payer: Self-pay | Admitting: *Deleted

## 2014-03-11 ENCOUNTER — Emergency Department (HOSPITAL_COMMUNITY)
Admission: EM | Admit: 2014-03-11 | Discharge: 2014-03-11 | Disposition: A | Discharge status: Home / Self Care | Payer: Medicare PPO | Attending: Emergency Medicine | Admitting: Emergency Medicine

## 2014-03-11 DIAGNOSIS — E785 Hyperlipidemia, unspecified: Secondary | ICD-10-CM | POA: Insufficient documentation

## 2014-03-11 DIAGNOSIS — Z8619 Personal history of other infectious and parasitic diseases: Secondary | ICD-10-CM | POA: Diagnosis not present

## 2014-03-11 DIAGNOSIS — Z79899 Other long term (current) drug therapy: Secondary | ICD-10-CM | POA: Insufficient documentation

## 2014-03-11 DIAGNOSIS — Z8719 Personal history of other diseases of the digestive system: Secondary | ICD-10-CM | POA: Diagnosis not present

## 2014-03-11 DIAGNOSIS — N521 Erectile dysfunction due to diseases classified elsewhere: Secondary | ICD-10-CM | POA: Insufficient documentation

## 2014-03-11 DIAGNOSIS — Z8781 Personal history of (healed) traumatic fracture: Secondary | ICD-10-CM | POA: Diagnosis not present

## 2014-03-11 DIAGNOSIS — Z87891 Personal history of nicotine dependence: Secondary | ICD-10-CM | POA: Diagnosis not present

## 2014-03-11 DIAGNOSIS — E113499 Type 2 diabetes mellitus with severe nonproliferative diabetic retinopathy without macular edema, unspecified eye: Secondary | ICD-10-CM

## 2014-03-11 DIAGNOSIS — R351 Nocturia: Secondary | ICD-10-CM | POA: Diagnosis not present

## 2014-03-11 DIAGNOSIS — E669 Obesity, unspecified: Secondary | ICD-10-CM | POA: Diagnosis not present

## 2014-03-11 DIAGNOSIS — N183 Chronic kidney disease, stage 3 (moderate): Secondary | ICD-10-CM | POA: Insufficient documentation

## 2014-03-11 DIAGNOSIS — I2510 Atherosclerotic heart disease of native coronary artery without angina pectoris: Secondary | ICD-10-CM | POA: Diagnosis not present

## 2014-03-11 DIAGNOSIS — Z880 Allergy status to penicillin: Secondary | ICD-10-CM | POA: Insufficient documentation

## 2014-03-11 DIAGNOSIS — Z7982 Long term (current) use of aspirin: Secondary | ICD-10-CM | POA: Diagnosis not present

## 2014-03-11 DIAGNOSIS — Z7951 Long term (current) use of inhaled steroids: Secondary | ICD-10-CM | POA: Insufficient documentation

## 2014-03-11 DIAGNOSIS — E1139 Type 2 diabetes mellitus with other diabetic ophthalmic complication: Secondary | ICD-10-CM | POA: Insufficient documentation

## 2014-03-11 DIAGNOSIS — I129 Hypertensive chronic kidney disease with stage 1 through stage 4 chronic kidney disease, or unspecified chronic kidney disease: Secondary | ICD-10-CM | POA: Insufficient documentation

## 2014-03-11 DIAGNOSIS — I739 Peripheral vascular disease, unspecified: Secondary | ICD-10-CM | POA: Diagnosis not present

## 2014-03-11 DIAGNOSIS — Z760 Encounter for issue of repeat prescription: Secondary | ICD-10-CM

## 2014-03-11 DIAGNOSIS — E1159 Type 2 diabetes mellitus with other circulatory complications: Secondary | ICD-10-CM | POA: Diagnosis not present

## 2014-03-11 DIAGNOSIS — E1140 Type 2 diabetes mellitus with diabetic neuropathy, unspecified: Secondary | ICD-10-CM | POA: Insufficient documentation

## 2014-03-11 DIAGNOSIS — E1122 Type 2 diabetes mellitus with diabetic chronic kidney disease: Secondary | ICD-10-CM | POA: Diagnosis not present

## 2014-03-11 DIAGNOSIS — Z794 Long term (current) use of insulin: Secondary | ICD-10-CM | POA: Insufficient documentation

## 2014-03-11 DIAGNOSIS — N401 Enlarged prostate with lower urinary tract symptoms: Secondary | ICD-10-CM | POA: Insufficient documentation

## 2014-03-11 LAB — CBG MONITORING, ED: GLUCOSE-CAPILLARY: 171 mg/dL — AB (ref 70–99)

## 2014-03-11 MED ORDER — INSULIN ASPART PROT & ASPART (70-30 MIX) 100 UNIT/ML PEN: 30.0000 [IU] | PEN_INJECTOR | Freq: Two times a day (BID) | SUBCUTANEOUS | Status: DC

## 2014-03-11 NOTE — ED Notes (Signed)
Pt needs medication refill for his 70/30 insulin.  Pt skipped last nights dose but took a dose this am.  Pt states that he feels fine

## 2014-03-11 NOTE — ED Provider Notes (Signed)
CSN: [id number]     Arrival date & time 03/11/14  1047 History   First MD Initiated Contact with Patient 03/11/14 1112     Chief Complaint  Patient presents with  . Medication Refill     (Consider location/radiation/quality/duration/timing/severity/associated sxs/prior Treatment) HPI Comments: Patient is a 68 year old male who presents for medication refill of Novolog 70/30 pen. Patient reports recently seeing his PCP who did not call in his prescription. Patient reports missing a dose last night but took his dose this morning. Patient will see his doctor next month. No complaints at this time.    Past Medical History  Diagnosis Date  . 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 Hyperlipidemia LDL goal < 100 04/02/2006  . Essential hypertension 12/09/2006  . Coronary artery disease 04/02/2006    s/p 2 stents, specifics unknown   . Benign prostatic hypertrophy with nocturia 07/07/2006  . Chronic venous insufficiency 04/12/2010  . Obesity (BMI 30.0-34.9) 01/15/2012  . Constipation 05/25/2009    Intermittent   . Microcytic normochromic anemia 05/27/2006  . Seasonal allergic rhinitis 05/25/2009  . Degenerative joint disease involving multiple joints 02/02/2007  . Internal and external hemorrhoids without complication 08/20/2012   Past Surgical History  Procedure Laterality Date  . Pilonidal cyst excision    . Eye surgery      Laser  . Fracture surgery      Fractured stump   Family History  Problem  Relation Age of Onset  . Hypertension Mother   . Heart attack Father   . Breast cancer Sister   . Arthritis Sister   . Heart attack Brother   . Diabetes Brother   . Stroke Brother   . Pneumonia Daughter   . Diabetes Brother   . Alcoholism Brother   . Diabetes Brother   . Arthritis Sister     Bilateral knee replacement  . HIV Daughter   . Hypertension Daughter   . Drug abuse Daughter   . Schizophrenia Daughter   . Hypertension Daughter   . Drug abuse Daughter   . Bipolar disorder Daughter    History  Substance Use Topics  . Smoking status: Former Smoker    Quit date: 06/16/1968  . Smokeless tobacco: Never Used  . Alcohol Use: No    Review of Systems  Constitutional: Negative for fever, chills and fatigue.       Medication refill  HENT: Negative for trouble swallowing.   Eyes: Negative for visual disturbance.  Respiratory: Negative for shortness of breath.   Cardiovascular: Negative for chest pain and palpitations.  Gastrointestinal: Negative for nausea, vomiting, abdominal pain and diarrhea.  Endocrine: Negative for polyphagia.  Genitourinary: Negative for dysuria and difficulty urinating.  Musculoskeletal: Negative for arthralgias and neck pain.  Skin: Negative for color change.  Neurological: Negative for dizziness and weakness.  Psychiatric/Behavioral: Negative for dysphoric mood.      Allergies  Amoxicillin; Doxycycline; and Tamsulosin  Home Medications   Prior to Admission medications   Medication Sig  Start Date End Date Taking? Authorizing Provider  amLODipine (NORVASC) 10 MG tablet Take 1 tablet (10 mg total) by mouth every morning. 12/29/13   Burns Spain, MD  aspirin EC 81 MG tablet Take 81 mg by mouth every morning.    Historical Provider, MD  calcium citrate-vitamin D (CITRACAL+D) 315-200 MG-UNIT per tablet Take 1 tablet by mouth daily.     Historical Provider, MD  carvedilol (COREG) 12.5 MG tablet Take 1 tablet (12.5 mg total) by mouth 2  (two) times daily with a meal. 01/05/14   Rocco Serene, MD  fish oil-omega-3 fatty acids 1000 MG capsule Take 1 g by mouth daily. 01/15/12   Rocco Serene, MD  fluticasone (FLONASE) 50 MCG/ACT nasal spray Place 2 sprays into both nostrils daily as needed for allergies. 12/29/13   Burns Spain, MD  furosemide (LASIX) 40 MG tablet Take as needed for a weight gain of more than 5 lbs in a week. 02/10/14   Rocco Serene, MD  hydroxypropyl methylcellulose (ISOPTO TEARS) 2.5 % ophthalmic solution Place 2 drops into both eyes 4 (four) times daily as needed for dry eyes.    Historical Provider, MD  Insulin Aspart Prot & Aspart (NOVOLOG MIX 70/30 FLEXPEN) (70-30) 100 UNIT/ML Pen Inject 30 Units into the skin 2 (two) times daily. 02/10/14   Rocco Serene, MD  Insulin Pen Needle (NOVOFINE) 30G X 8 MM MISC Inject SQ as directed twice daily, use new pen needle for each injection 09/10/10   Edsel Petrin, DO  lisinopril (PRINIVIL,ZESTRIL) 10 MG tablet Take 1 tablet (10 mg total) by mouth every morning. 05/25/13   Otis Brace, MD  Multiple Vitamins-Minerals (MULTIVITAMIN WITH MINERALS) tablet Take 1 tablet by mouth daily.    Historical Provider, MD  pravastatin (PRAVACHOL) 10 MG tablet Take 1 tablet (10 mg total) by mouth every evening. 11/11/13 11/11/14  Rocco Serene, MD  terazosin (HYTRIN) 10 MG capsule Take 10 mg by mouth at bedtime.    Historical Provider, MD   BP 145/73 mmHg  Pulse 65  Temp(Src) 97.4 F (36.3 C)  Resp 22  Ht 5\' 8"  (1.727 m)  Wt 206 lb (93.441 kg)  BMI 31.33 kg/m2  SpO2 100% Physical Exam  Constitutional: He is oriented to person, place, and time. He appears well-developed and well-nourished. No distress.  HENT:  Head: Normocephalic and atraumatic.  Eyes: Conjunctivae and EOM are normal.  Neck: Normal range of motion.  Cardiovascular: Normal rate and regular rhythm.  Exam reveals no gallop and no friction rub.   No murmur heard. Pulmonary/Chest: Effort  normal and breath sounds normal. He has no wheezes. He has no rales. He exhibits no tenderness.  Abdominal: Soft. There is no tenderness.  Musculoskeletal: Normal range of motion.  Neurological: He is alert and oriented to person, place, and time. Coordination normal.  Speech is goal-oriented. Moves limbs without ataxia.   Skin: Skin is warm and dry.  Psychiatric: He has a normal mood and affect. His behavior is normal.  Nursing note and vitals reviewed.   ED Course  Procedures (including critical care time) Labs Review Labs Reviewed  CBG MONITORING, ED - Abnormal; Notable for the following:    Glucose-Capillary 171 (*)    All other components within normal limits    Imaging Review No results found.   EKG Interpretation None      MDM   Final diagnoses:  Medication refill   11:49 AM Patient will have refill of  Novolog pen. Glucose is 171 at this time. Patient has no complaints. Vitals stable and patient afebrile.    Emilia BeckKaitlyn Juanell Saffo, PA-C 03/11/14 1155  Nelia Shiobert L Beaton, MD 03/12/14 0800

## 2014-05-16 ENCOUNTER — Other Ambulatory Visit: Payer: Self-pay | Admitting: *Deleted

## 2014-05-16 DIAGNOSIS — E1122 Type 2 diabetes mellitus with diabetic chronic kidney disease: Secondary | ICD-10-CM

## 2014-05-16 DIAGNOSIS — N183 Chronic kidney disease, stage 3 unspecified: Secondary | ICD-10-CM

## 2014-05-16 MED ORDER — INSULIN ASPART PROT & ASPART (70-30 MIX) 100 UNIT/ML PEN: 30.0000 [IU] | PEN_INJECTOR | Freq: Two times a day (BID) | SUBCUTANEOUS | Status: DC

## 2014-05-16 NOTE — Telephone Encounter (Signed)
Refill request per pt's wife, has f/u appt next week with pcp.[name] [name], [name] Dimartino Cassady2/23/20162:29 PM

## 2014-05-23 DIAGNOSIS — Z9289 Personal history of other medical treatment: Secondary | ICD-10-CM

## 2014-05-23 HISTORY — DX: Personal history of other medical treatment: Z92.89

## 2014-05-23 LAB — HM DIABETES EYE EXAM

## 2014-05-25 ENCOUNTER — Encounter: Payer: Self-pay | Admitting: *Deleted

## 2014-05-25 ENCOUNTER — Telehealth: Payer: Self-pay | Admitting: Internal Medicine

## 2014-05-25 NOTE — Telephone Encounter (Signed)
Call to patient to confirm appointment for 05/26/14 at 11:15. [email or web address]

## 2014-05-26 ENCOUNTER — Encounter: Payer: Self-pay | Admitting: Internal Medicine

## 2014-05-26 ENCOUNTER — Ambulatory Visit (INDEPENDENT_AMBULATORY_CARE_PROVIDER_SITE_OTHER): Payer: Medicare PPO | Admitting: Internal Medicine

## 2014-05-26 VITALS — BP 137/82 | HR 64 | Temp 99.0°F | Wt 207.3 lb

## 2014-05-26 DIAGNOSIS — E1122 Type 2 diabetes mellitus with diabetic chronic kidney disease: Secondary | ICD-10-CM

## 2014-05-26 DIAGNOSIS — E1149 Type 2 diabetes mellitus with other diabetic neurological complication: Secondary | ICD-10-CM

## 2014-05-26 DIAGNOSIS — Z7982 Long term (current) use of aspirin: Secondary | ICD-10-CM

## 2014-05-26 DIAGNOSIS — E113499 Type 2 diabetes mellitus with severe nonproliferative diabetic retinopathy without macular edema, unspecified eye: Secondary | ICD-10-CM

## 2014-05-26 DIAGNOSIS — J302 Other seasonal allergic rhinitis: Secondary | ICD-10-CM

## 2014-05-26 DIAGNOSIS — Z794 Long term (current) use of insulin: Secondary | ICD-10-CM

## 2014-05-26 DIAGNOSIS — R351 Nocturia: Secondary | ICD-10-CM

## 2014-05-26 DIAGNOSIS — N401 Enlarged prostate with lower urinary tract symptoms: Secondary | ICD-10-CM

## 2014-05-26 DIAGNOSIS — N183 Chronic kidney disease, stage 3 unspecified: Secondary | ICD-10-CM

## 2014-05-26 DIAGNOSIS — I2510 Atherosclerotic heart disease of native coronary artery without angina pectoris: Secondary | ICD-10-CM

## 2014-05-26 DIAGNOSIS — E11349 Type 2 diabetes mellitus with severe nonproliferative diabetic retinopathy without macular edema: Secondary | ICD-10-CM

## 2014-05-26 DIAGNOSIS — I10 Essential (primary) hypertension: Secondary | ICD-10-CM

## 2014-05-26 LAB — BASIC METABOLIC PANEL WITH GFR
BUN: 49 mg/dL — ABNORMAL HIGH (ref 6–23)
CO2: 23 meq/L (ref 19–32)
Calcium: 8.6 mg/dL (ref 8.4–10.5)
Chloride: 110 mEq/L (ref 96–112)
Creat: 1.92 mg/dL — ABNORMAL HIGH (ref 0.50–1.35)
GFR, EST NON AFRICAN AMERICAN: 34 mL/min — AB
GFR, Est African American: 40 mL/min — ABNORMAL LOW
Glucose, Bld: 98 mg/dL (ref 70–99)
Potassium: 4.7 mEq/L (ref 3.5–5.3)
SODIUM: 141 meq/L (ref 135–145)

## 2014-05-26 LAB — POCT GLYCOSYLATED HEMOGLOBIN (HGB A1C): HEMOGLOBIN A1C: 6.3

## 2014-05-26 LAB — GLUCOSE, CAPILLARY: Glucose-Capillary: 144 mg/dL — ABNORMAL HIGH (ref 70–99)

## 2014-05-26 NOTE — Assessment & Plan Note (Signed)
He denies any chest pain on the Coreg, amlodipine, and aspirin. We will continue with these antianginals. He had some weakness and fatigue on the pravastatin and this was held. After holding this medication his symptoms resolved. We will therefore avoid statin therapy at this time given the associated weakness.

## 2014-05-26 NOTE — Progress Notes (Signed)
   Subjective:    Patient ID: [name] [name], male    DOB: 12/04/1945, 69 y.o.   MRN: [id number]  HPI  Please see the A&P for the status of the pt's chronic medical problems.  Review of Systems  Constitutional: Negative for activity change, appetite change, fatigue and unexpected weight change.  HENT: Negative for congestion, postnasal drip, rhinorrhea and sinus pressure.   Respiratory: Negative for cough, chest tightness and shortness of breath.   Cardiovascular: Negative for chest pain, palpitations and leg swelling.  Gastrointestinal: Negative for nausea, vomiting, abdominal pain, diarrhea, constipation and abdominal distention.  Genitourinary: Negative for frequency, decreased urine volume and difficulty urinating.  Skin: Negative for rash.      Objective:   Physical Exam  Constitutional: He is oriented to person, place, and time. He appears well-developed and well-nourished. No distress.  HENT:  Head: Normocephalic and atraumatic.  Eyes: Conjunctivae are normal. Right eye exhibits no discharge. Left eye exhibits no discharge. No scleral icterus.  Neurological: He is alert and oriented to person, place, and time.  Skin: He is not diaphoretic.  Psychiatric: He has a normal mood and affect. His behavior is normal. Judgment and thought content normal.  Nursing note and vitals reviewed.     Assessment & Plan:   Please see Problem Oriented Charting.

## 2014-05-26 NOTE — Assessment & Plan Note (Signed)
He is taking the Terazosin 10 mg by mouth each night without difficulty including orthostatic symptoms. He admits to nocturia 2 but this is stable and not bothersome to him. We will therefore continue the Terazosin at 10 mg by mouth every night.

## 2014-05-26 NOTE — Assessment & Plan Note (Signed)
His blood pressure today was at target at 137/82. This is while taking amlodipine 10 mg by mouth daily, coreg 12.5 mg by mouth twice daily, and Terazosin 10 mg by mouth at night. There has been some confusion on whether or not he should be taking the lisinopril. He was under the impression he was not to be taking it. We will continue the amlodipine, Coreg, and Terazosin at the current doses and follow-up on a basic metabolic panel that was drawn today. If he has stage IV chronic kidney disease there is no role for the lisinopril.

## 2014-05-26 NOTE — Assessment & Plan Note (Signed)
He states his nasal symptoms are well controlled on the nasal steroid. We will therefore continue with this therapy.

## 2014-05-26 NOTE — Assessment & Plan Note (Signed)
Mr. Domenica Reamerorwood's diabetic control is markedly improved since the last clinic visit. Today his hemoglobin A1c was 6.3 down from 7.1. He has been much more careful to avoid candy, watch his portion sizes, and eat more salads. He has very rare hypoglycemic episodes on the NovoLog 70/30 flex pen 30 units twice daily. A recent ophthalmologic evaluation demonstrated stable nonproliferative retinopathy. He is otherwise up-to-date on his diabetic health care maintenance. We will continue the NovoLog 70/30 flex pen 30 units twice daily and repeat the hemoglobin A1c at the follow-up visit.

## 2014-05-26 NOTE — Patient Instructions (Signed)
It is always a joy to see you.  You are doing a wonderful job with your diabetes.  1) Keep taking all of your medications as you are.  2) We drew blood today to check your kidneys.  I will call you when I get back in town with the results.  I will see you back in 3 months, sooner if necessary.

## 2014-05-29 ENCOUNTER — Ambulatory Visit: Payer: Medicare PPO | Admitting: Internal Medicine

## 2014-05-29 MED ORDER — LISINOPRIL 10 MG PO TABS: 10.0000 mg | ORAL_TABLET | Freq: Every morning | ORAL | Status: DC

## 2014-05-29 NOTE — Addendum Note (Signed)
Addended by: Doneen PoissonKLIMA, Clarity Ciszek D on: 05/29/2014 03:50 PM   Modules accepted: Orders

## 2014-05-29 NOTE — Progress Notes (Signed)
BMP: K 4.7, BUN 49, Creatinine 1.92, eGFR 40  I called [name] [name] with the news that the kidney function is slightly improved.  We discussed restarting the lisinopril at 10 mg daily given his stage III chronic kidney disease and he was in favor.  I re-wrote the prescription and we will check the BMP back on lisinopril at the follow-up visit.

## 2014-06-13 ENCOUNTER — Emergency Department (HOSPITAL_COMMUNITY): Payer: Medicare PPO

## 2014-06-13 ENCOUNTER — Inpatient Hospital Stay (HOSPITAL_COMMUNITY)
Admission: EM | Admit: 2014-06-13 | Discharge: 2014-06-18 | DRG: 470 | Disposition: A | Discharge status: Skilled Nursing Facility | Payer: Medicare PPO | Attending: Internal Medicine | Admitting: Internal Medicine

## 2014-06-13 ENCOUNTER — Encounter (HOSPITAL_COMMUNITY): Payer: Self-pay | Admitting: Emergency Medicine

## 2014-06-13 DIAGNOSIS — I2510 Atherosclerotic heart disease of native coronary artery without angina pectoris: Secondary | ICD-10-CM | POA: Diagnosis present

## 2014-06-13 DIAGNOSIS — E1122 Type 2 diabetes mellitus with diabetic chronic kidney disease: Secondary | ICD-10-CM | POA: Diagnosis present

## 2014-06-13 DIAGNOSIS — N183 Chronic kidney disease, stage 3 (moderate): Secondary | ICD-10-CM | POA: Diagnosis present

## 2014-06-13 DIAGNOSIS — N179 Acute kidney failure, unspecified: Secondary | ICD-10-CM

## 2014-06-13 DIAGNOSIS — R351 Nocturia: Secondary | ICD-10-CM | POA: Diagnosis present

## 2014-06-13 DIAGNOSIS — Z79899 Other long term (current) drug therapy: Secondary | ICD-10-CM

## 2014-06-13 DIAGNOSIS — Z96649 Presence of unspecified artificial hip joint: Secondary | ICD-10-CM

## 2014-06-13 DIAGNOSIS — Z880 Allergy status to penicillin: Secondary | ICD-10-CM

## 2014-06-13 DIAGNOSIS — Z833 Family history of diabetes mellitus: Secondary | ICD-10-CM

## 2014-06-13 DIAGNOSIS — Z794 Long term (current) use of insulin: Secondary | ICD-10-CM

## 2014-06-13 DIAGNOSIS — Z87891 Personal history of nicotine dependence: Secondary | ICD-10-CM

## 2014-06-13 DIAGNOSIS — S72002A Fracture of unspecified part of neck of left femur, initial encounter for closed fracture: Secondary | ICD-10-CM | POA: Diagnosis present

## 2014-06-13 DIAGNOSIS — Z89611 Acquired absence of right leg above knee: Secondary | ICD-10-CM

## 2014-06-13 DIAGNOSIS — I739 Peripheral vascular disease, unspecified: Secondary | ICD-10-CM | POA: Diagnosis present

## 2014-06-13 DIAGNOSIS — I129 Hypertensive chronic kidney disease with stage 1 through stage 4 chronic kidney disease, or unspecified chronic kidney disease: Secondary | ICD-10-CM | POA: Diagnosis present

## 2014-06-13 DIAGNOSIS — M159 Polyosteoarthritis, unspecified: Secondary | ICD-10-CM | POA: Diagnosis present

## 2014-06-13 DIAGNOSIS — R5082 Postprocedural fever: Secondary | ICD-10-CM | POA: Diagnosis not present

## 2014-06-13 DIAGNOSIS — E875 Hyperkalemia: Secondary | ICD-10-CM | POA: Diagnosis present

## 2014-06-13 DIAGNOSIS — Y92007 Garden or yard of unspecified non-institutional (private) residence as the place of occurrence of the external cause: Secondary | ICD-10-CM

## 2014-06-13 DIAGNOSIS — N185 Chronic kidney disease, stage 5: Secondary | ICD-10-CM

## 2014-06-13 DIAGNOSIS — E785 Hyperlipidemia, unspecified: Secondary | ICD-10-CM | POA: Diagnosis present

## 2014-06-13 DIAGNOSIS — W19XXXA Unspecified fall, initial encounter: Secondary | ICD-10-CM | POA: Insufficient documentation

## 2014-06-13 DIAGNOSIS — R509 Fever, unspecified: Secondary | ICD-10-CM

## 2014-06-13 DIAGNOSIS — D631 Anemia in chronic kidney disease: Secondary | ICD-10-CM | POA: Diagnosis present

## 2014-06-13 DIAGNOSIS — S72032A Displaced midcervical fracture of left femur, initial encounter for closed fracture: Secondary | ICD-10-CM | POA: Diagnosis not present

## 2014-06-13 DIAGNOSIS — E1151 Type 2 diabetes mellitus with diabetic peripheral angiopathy without gangrene: Secondary | ICD-10-CM | POA: Diagnosis present

## 2014-06-13 DIAGNOSIS — E860 Dehydration: Secondary | ICD-10-CM | POA: Diagnosis present

## 2014-06-13 DIAGNOSIS — Z881 Allergy status to other antibiotic agents status: Secondary | ICD-10-CM

## 2014-06-13 DIAGNOSIS — D696 Thrombocytopenia, unspecified: Secondary | ICD-10-CM | POA: Diagnosis present

## 2014-06-13 DIAGNOSIS — Z888 Allergy status to other drugs, medicaments and biological substances status: Secondary | ICD-10-CM

## 2014-06-13 DIAGNOSIS — Z8249 Family history of ischemic heart disease and other diseases of the circulatory system: Secondary | ICD-10-CM

## 2014-06-13 DIAGNOSIS — Z7982 Long term (current) use of aspirin: Secondary | ICD-10-CM

## 2014-06-13 DIAGNOSIS — N401 Enlarged prostate with lower urinary tract symptoms: Secondary | ICD-10-CM | POA: Diagnosis present

## 2014-06-13 DIAGNOSIS — E1149 Type 2 diabetes mellitus with other diabetic neurological complication: Secondary | ICD-10-CM | POA: Diagnosis present

## 2014-06-13 DIAGNOSIS — N186 End stage renal disease: Secondary | ICD-10-CM

## 2014-06-13 DIAGNOSIS — K219 Gastro-esophageal reflux disease without esophagitis: Secondary | ICD-10-CM | POA: Diagnosis present

## 2014-06-13 DIAGNOSIS — R7989 Other specified abnormal findings of blood chemistry: Secondary | ICD-10-CM | POA: Diagnosis present

## 2014-06-13 DIAGNOSIS — I10 Essential (primary) hypertension: Secondary | ICD-10-CM | POA: Diagnosis present

## 2014-06-13 DIAGNOSIS — Z823 Family history of stroke: Secondary | ICD-10-CM

## 2014-06-13 DIAGNOSIS — E11319 Type 2 diabetes mellitus with unspecified diabetic retinopathy without macular edema: Secondary | ICD-10-CM | POA: Diagnosis present

## 2014-06-13 DIAGNOSIS — D509 Iron deficiency anemia, unspecified: Secondary | ICD-10-CM | POA: Diagnosis present

## 2014-06-13 DIAGNOSIS — Z419 Encounter for procedure for purposes other than remedying health state, unspecified: Secondary | ICD-10-CM

## 2014-06-13 DIAGNOSIS — D62 Acute posthemorrhagic anemia: Secondary | ICD-10-CM | POA: Diagnosis present

## 2014-06-13 LAB — CBC WITH DIFFERENTIAL/PLATELET
Basophils Absolute: 0 10*3/uL (ref 0.0–0.1)
Basophils Relative: 0 % (ref 0–1)
Eosinophils Absolute: 0.2 10*3/uL (ref 0.0–0.7)
Eosinophils Relative: 2 % (ref 0–5)
HCT: 32.8 % — ABNORMAL LOW (ref 39.0–52.0)
Hemoglobin: 10.6 g/dL — ABNORMAL LOW (ref 13.0–17.0)
Lymphocytes Relative: 26 % (ref 12–46)
Lymphs Abs: 1.9 10*3/uL (ref 0.7–4.0)
MCH: 23.6 pg — AB (ref 26.0–34.0)
MCHC: 32.3 g/dL (ref 30.0–36.0)
MCV: 72.9 fL — ABNORMAL LOW (ref 78.0–100.0)
Monocytes Absolute: 0.4 10*3/uL (ref 0.1–1.0)
Monocytes Relative: 6 % (ref 3–12)
NEUTROS ABS: 4.8 10*3/uL (ref 1.7–7.7)
NEUTROS PCT: 66 % (ref 43–77)
PLATELETS: 122 10*3/uL — AB (ref 150–400)
RBC: 4.5 MIL/uL (ref 4.22–5.81)
RDW: 14.5 % (ref 11.5–15.5)
WBC: 7.3 10*3/uL (ref 4.0–10.5)

## 2014-06-13 LAB — COMPREHENSIVE METABOLIC PANEL
ALBUMIN: 3.2 g/dL — AB (ref 3.5–5.2)
ALK PHOS: 121 U/L — AB (ref 39–117)
ALT: 55 U/L — ABNORMAL HIGH (ref 0–53)
AST: 66 U/L — AB (ref 0–37)
Anion gap: 10 (ref 5–15)
BILIRUBIN TOTAL: 0.7 mg/dL (ref 0.3–1.2)
BUN: 57 mg/dL — AB (ref 6–23)
CO2: 17 mmol/L — ABNORMAL LOW (ref 19–32)
CREATININE: 2.37 mg/dL — AB (ref 0.50–1.35)
Calcium: 8.9 mg/dL (ref 8.4–10.5)
Chloride: 113 mmol/L — ABNORMAL HIGH (ref 96–112)
GFR calc Af Amer: 30 mL/min — ABNORMAL LOW (ref >90)
GFR calc non Af Amer: 26 mL/min — ABNORMAL LOW (ref >90)
Glucose, Bld: 170 mg/dL — ABNORMAL HIGH (ref 70–99)
POTASSIUM: 6.5 mmol/L — AB (ref 3.5–5.1)
Sodium: 140 mmol/L (ref 135–145)
TOTAL PROTEIN: 7.8 g/dL (ref 6.0–8.3)

## 2014-06-13 LAB — PROTIME-INR
INR: 1.16 (ref 0.00–1.49)
Prothrombin Time: 14.9 seconds (ref 11.6–15.2)

## 2014-06-13 LAB — TYPE AND SCREEN
ABO/RH(D): O POS
Antibody Screen: NEGATIVE

## 2014-06-13 LAB — ABO/RH: ABO/RH(D): O POS

## 2014-06-13 MED ORDER — SODIUM CHLORIDE 0.9 % IV SOLN
1.0000 g | Freq: Once | INTRAVENOUS | Status: AC
  Administered 2014-06-14: 1 g via INTRAVENOUS
  Filled 2014-06-13: qty 10

## 2014-06-13 MED ORDER — DEXTROSE 50 % IV SOLN
1.0000 | Freq: Once | INTRAVENOUS | Status: AC
  Administered 2014-06-14: 50 mL via INTRAVENOUS
  Filled 2014-06-13: qty 50

## 2014-06-13 MED ORDER — INSULIN ASPART 100 UNIT/ML ~~LOC~~ SOLN
5.0000 [IU] | Freq: Once | SUBCUTANEOUS | Status: AC
  Administered 2014-06-14: 5 [IU] via INTRAVENOUS
  Filled 2014-06-13: qty 1

## 2014-06-13 MED ORDER — SODIUM CHLORIDE 0.9 % IV BOLUS (SEPSIS): 500.0000 mL | Freq: Once | INTRAVENOUS | Status: DC

## 2014-06-13 MED ORDER — SODIUM CHLORIDE 0.9 % IV BOLUS (SEPSIS)
500.0000 mL | Freq: Once | INTRAVENOUS | Status: AC
  Administered 2014-06-14: 500 mL via INTRAVENOUS

## 2014-06-13 MED ORDER — SODIUM BICARBONATE 8.4 % IV SOLN
50.0000 meq | Freq: Once | INTRAVENOUS | Status: AC
  Administered 2014-06-14: 50 meq via INTRAVENOUS
  Filled 2014-06-13: qty 50

## 2014-06-13 NOTE — ED Provider Notes (Signed)
CSN: [id number]     Arrival date & time 06/13/14  2114 History   First MD Initiated Contact with Patient 06/13/14 2121     No chief complaint on file.    The history is provided by the patient and the EMS personnel. No language interpreter was used.   [name] [name] presents for evaluation of injuries following a fall. 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 This happened just prior to ED arrival. He fell a wheelchair because he was getting out to push up the ramp since the battery was low. He denies any head injury or loss of consciousness. He reports pain in his left hip. 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 He has pain with range of motion of the left lower extremity. Symptoms are moderate and constant.  Past Medical History  Diagnosis Date  . 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 Hyperlipidemia LDL goal < 100 04/02/2006  . Essential hypertension 12/09/2006  . Coronary artery disease 04/02/2006    s/p 2 stents, specifics unknown   . Benign prostatic hypertrophy with nocturia 07/07/2006  . Chronic venous insufficiency 04/12/2010  . Obesity (BMI 30.0-34.9) 01/15/2012  . Constipation 05/25/2009    Intermittent   . Microcytic normochromic anemia 05/27/2006  . Seasonal allergic rhinitis 05/25/2009  . Degenerative joint disease involving multiple joints 02/02/2007  . Internal and external  hemorrhoids without complication 08/20/2012   Past Surgical History  Procedure Laterality Date  . Pilonidal cyst excision    . Eye surgery      Laser  . Fracture surgery      Fractured stump   Family History  Problem Relation Age of Onset  . Hypertension Mother   . Heart attack Father   . Breast cancer Sister   . Arthritis Sister   . Heart attack Brother   . Diabetes Brother   . Stroke Brother   . Pneumonia Daughter   . Diabetes Brother   . Alcoholism Brother   . Diabetes Brother   . Arthritis Sister     Bilateral knee replacement  . HIV Daughter   . Hypertension Daughter   . Drug abuse Daughter   . Schizophrenia Daughter   . Hypertension Daughter   . Drug abuse Daughter   . Bipolar disorder Daughter    History  Substance Use Topics  . Smoking status: Former Smoker    Quit date: 06/16/1968  . Smokeless tobacco: Never Used  . Alcohol Use: No    Review of Systems  All other systems reviewed and are negative.     Allergies  Amoxicillin; Doxycycline; Pravastatin; and Tamsulosin  Home Medications   Prior to Admission medications   Medication Sig Start Date End Date Taking? Authorizing Provider  amLODipine (NORVASC) 10 MG tablet Take 1 tablet (10 mg total) by mouth every morning. 12/29/13   Austin Miles  Rogelia Boga, MD  aspirin EC 81 MG tablet Take 81 mg by mouth every morning.    Historical Provider, MD  calcium citrate-vitamin D (CITRACAL+D) 315-200 MG-UNIT per tablet Take 1 tablet by mouth daily.     Historical Provider, MD  carvedilol (COREG) 12.5 MG tablet Take 1 tablet (12.5 mg total) by mouth 2 (two) times daily with a meal. 01/05/14   Doneen Poisson, MD  fish oil-omega-3 fatty acids 1000 MG capsule Take 1 g by mouth daily. 01/15/12   Doneen Poisson, MD  fluticasone [facility]) 50 MCG/ACT nasal spray Place 2 sprays into both nostrils daily as needed for allergies. 12/29/13   Burns Spain, MD  furosemide (LASIX) 40 MG tablet Take as needed for a weight gain of  more than 5 lbs in a week. 02/10/14   Doneen Poisson, MD  hydroxypropyl methylcellulose (ISOPTO TEARS) 2.5 % ophthalmic solution Place 2 drops into both eyes 4 (four) times daily as needed for dry eyes.    Historical Provider, MD  insulin aspart protamine - aspart (NOVOLOG MIX 70/30 FLEXPEN) (70-30) 100 UNIT/ML FlexPen Inject 0.3 mLs (30 Units total) into the skin 2 (two) times daily. 05/16/14   Doneen Poisson, MD  Insulin Pen Needle (NOVOFINE) 30G X 8 MM MISC Inject SQ as directed twice daily, use new pen needle for each injection 09/10/10   Edsel Petrin, DO  lisinopril (PRINIVIL,ZESTRIL) 10 MG tablet Take 1 tablet (10 mg total) by mouth every morning. 05/29/14   Doneen Poisson, MD  Multiple Vitamins-Minerals (MULTIVITAMIN WITH MINERALS) tablet Take 1 tablet by mouth daily.    Historical Provider, MD  terazosin (HYTRIN) 10 MG capsule Take 10 mg by mouth at bedtime.    Historical Provider, MD   There were no vitals taken for this visit. Physical Exam  Constitutional: He is oriented to person, place, and time. He appears well-developed and well-nourished.  HENT:  Head: Normocephalic and atraumatic.  Neck:  No C-spine tenderness  Cardiovascular: Normal rate and regular rhythm.   No murmur heard. Pulmonary/Chest: Effort normal and breath sounds normal. No respiratory distress.  Abdominal: Soft. There is no tenderness. There is no rebound and no guarding.  Musculoskeletal:  Right AKA with prosthesis in place. 1+ DP pulses in the left lower extremity with 1+ nonpitting edema in the left lower extremity. There is mild tenderness to palpation in the left posterior hip with decreased range of motion of the left hip.  Neurological: He is alert and oriented to person, place, and time.  Skin: Skin is warm and dry.  Psychiatric: He has a normal mood and affect. His behavior is normal.  Nursing note and vitals reviewed.   ED Course  Procedures (including critical care time) Labs Review Labs  Reviewed  COMPREHENSIVE METABOLIC PANEL - Abnormal; Notable for the following:    Potassium 6.5 (*)    Chloride 113 (*)    CO2 17 (*)    Glucose, Bld 170 (*)    BUN 57 (*)    Creatinine, Ser 2.37 (*)    Albumin 3.2 (*)    AST 66 (*)    ALT 55 (*)    Alkaline Phosphatase 121 (*)    GFR calc non Af Amer 26 (*)    GFR calc Af Amer 30 (*)    All other components within normal limits  CBC WITH DIFFERENTIAL/PLATELET - Abnormal; Notable for the following:    Hemoglobin 10.6 (*)    HCT 32.8 (*)    MCV 72.9 (*)  MCH 23.6 (*)    Platelets 122 (*)    All other components within normal limits  URINE CULTURE  PROTIME-INR  URINALYSIS, ROUTINE W REFLEX MICROSCOPIC  TYPE AND SCREEN  ABO/RH    Imaging Review Dg Chest 1 View  06/13/2014   CLINICAL DATA:  Fall.  Left hip pain.  Left femoral neck fracture.  EXAM: CHEST  1 VIEW  COMPARISON:  05/24/2013  FINDINGS: Prominent right upper paratracheal stripe and mild prominence of soft tissue density above the aortic arch, both worsened from 05/24/2013. Mediastinal width in this vicinity is 9.7 cm. Coronary atherosclerotic calcification is visible. Borderline cardiomegaly. No edema.  IMPRESSION: 1. Increased prominence of upper mediastinal soft tissues. I cannot exclude adenopathy or mass given the change from prior, although some of the appearance is probably attributable to the AP supine technique. Given that an PA upright view of the chest may be problematic due to the patient's hip fracture currently, chest CT should be considered. 2. Borderline cardiomegaly.   Electronically Signed   By: Gaylyn RongWalter  Liebkemann M.D.   On: 06/13/2014 22:22   Dg Hip Unilat With Pelvis 2-3 Views Left  06/13/2014   CLINICAL DATA:  Fall tonight with left hip pain  EXAM: LEFT HIP (WITH PELVIS) 2-3 VIEWS  COMPARISON:  None.  FINDINGS: There is a mildly displaced acute fracture of the left femoral neck, mainly transcervical. The left femoral head remains located.  No evidence of  pelvic ring fracture or diastasis.  Osteopenia.  The right hip is negative.  IMPRESSION: Mildly displaced left femoral neck fracture.   Electronically Signed   By: Marnee SpringJonathon  Watts M.D.   On: 06/13/2014 21:59     EKG Interpretation   Date/Time:  Tuesday June 13 2014 22:02:07 EDT Ventricular Rate:  60 PR Interval:  156 QRS Duration: 146 QT Interval:  471 QTC Calculation: 471 R Axis:   -53 Text Interpretation:  Sinus rhythm Ventricular premature complex RBBB and  LAFB Baseline wander in lead(s) V2 V4 V5 V6 Partial missing lead(s): V5  Confirmed by Lincoln Brighamees, Liz [phone]([id number]) on 06/13/2014 10:39:33 PM      MDM   Final diagnoses:  Fall  Left displaced femoral neck fracture, closed, initial encounter  Hyperkalemia   patient here for evaluation of injuries following a mechanical fall. Patient is noted to be tender on the left hip but not requiring any pain medications in the emergency department. Patient signed was clinically cleared. He has no history of head injury and he has no evidence of head trauma and he has no spine tenderness.  Patient's orthopedic surgeon is Dr. Lajoyce Cornersuda. Discussed with Dr. Roda ShuttersXu, who is on call for Orthopedics. Plan to admit to the medicine service for further management of his medical comorbidities. He recommends that the patient be left nothing by mouth after midnight for possible surgical intervention tomorrow. Patient does have chronic renal insufficiency and BMP demonstrates acute hyperkalemia. He was recently restarted his lisinopril 2 weeks ago. Hyperkalemia was treated for temporizing measures.    Tilden FossaElizabeth Cathalina Barcia, MD 06/13/14 [phone]

## 2014-06-13 NOTE — ED Notes (Signed)
Lab called critical K. MD aware.

## 2014-06-13 NOTE — ED Notes (Signed)
Patient transported to X-ray 

## 2014-06-13 NOTE — H&P (Signed)
Date: 06/13/2014               Patient Name:  [name] [name] MRN: [id number]  DOB: [birth year] Age / Sex: 69 y.o., male   PCP: Doneen Poisson, MD         Medical Service: Internal Medicine Teaching Service         Attending Physician: Dr. Aletta Edouard, MD    First Contact: Dr. Gara Kroner Pager: [phone]  Second Contact: Dr. Carlynn Purl  Pager: [phone]       After Hours (After 5p/  First Contact Pager: [phone]  weekends / holidays): Second Contact Pager: [phone]   Chief Complaint: Fall  History of Present Illness: Mr. Fatzinger is a 69 year old male with type 2 diabetes complicated by retinopathy, peripheral vascular disease, hypertension, chronic kidney disease stage III, BPH, coronary artery disease presents to the ED after sustaining a fall. His wife was present the time of interview and contributed.  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 He attempted to go up the ramp to his house was unable to do so given the low battery. He then attempted to walk up by clutching the railing. In doing so, he fell on his side and was down for roughly 10 minutes before his wife could to his aid. While down, he reports not being able to move the leg but mainly because of the pain and denies loss of consciousness, numbness/tingling, dizziness, or hitting his head. His wife reports that it has been roughly 2-3 years since his last fall. At baseline, she reports that he uses a walker to ambulate around the house but goes in a power chair when he is outside. He also wears a prosthesis for his right lower extremity after he underwent AKA in November 2006. He was recently seen by his PCP, Dr. Josem Kaufmann, on 05/25/2012 after which he was restarted on lisinopril 10 mg given stable renal function. He does report diarrhea for the last 2-3 days after he ate something that upset his stomach but denies any abdominal pain, hematuria, hematochezia, fever,  recent antibiotic use, poor  appetite, sick contacts, chest pain, shortness of breath, missing any of his regular medications. In the ED, hip x-ray was notable for mildly displaced left femoral neck fracture. He was also found to have potassium 6.5 and given 1 amp of sodium bicarbonate, D50, NovoLog 5 units, calcium gluconate IV along with 500 mL normal saline bolus.    Meds: Current Facility-Administered Medications  Medication Dose Route Frequency Provider Last Rate Last Dose  . calcium gluconate 1 g in sodium chloride 0.9 % 100 mL IVPB  1 g Intravenous Once Tilden Fossa, MD      . dextrose 50 % solution 50 mL  1 ampule Intravenous Once Tilden Fossa, MD      . insulin aspart (novoLOG) injection 5 Units  5 Units Intravenous Once Tilden Fossa, MD      . sodium bicarbonate injection 50 mEq  50 mEq Intravenous Once Tilden Fossa, MD      . sodium chloride 0.9 % bolus 500 mL  500 mL Intravenous Once Tilden Fossa, MD       Current Outpatient Prescriptions  Medication Sig Dispense Refill  . amLODipine (NORVASC) 10 MG tablet Take 1 tablet (10 mg total) by mouth every morning. 30 tablet 11  . aspirin EC 81 MG tablet Take 81 mg by mouth every morning.    . calcium citrate-vitamin D (CITRACAL+D) 315-200 MG-UNIT per tablet  Take 1 tablet by mouth daily.     . carvedilol (COREG) 12.5 MG tablet Take 1 tablet (12.5 mg total) by mouth 2 (two) times daily with a meal. 180 tablet 3  . fish oil-omega-3 fatty acids 1000 MG capsule Take 1 g by mouth daily.    . fluticasone (FLONASE) 50 MCG/ACT nasal spray Place 2 sprays into both nostrils daily as needed for allergies. 16 g 11  . furosemide (LASIX) 40 MG tablet Take as needed for a weight gain of more than 5 lbs in a week. 90 tablet 3  . hydroxypropyl methylcellulose (ISOPTO TEARS) 2.5 % ophthalmic solution Place 2 drops into both eyes 4 (four) times daily as needed for dry eyes.    . insulin aspart protamine - aspart (NOVOLOG MIX 70/30 FLEXPEN) (70-30) 100 UNIT/ML FlexPen Inject  0.3 mLs (30 Units total) into the skin 2 (two) times daily. 18 pen 3  . lisinopril (PRINIVIL,ZESTRIL) 10 MG tablet Take 1 tablet (10 mg total) by mouth every morning. 90 tablet 3  . Multiple Vitamins-Minerals (MULTIVITAMIN WITH MINERALS) tablet Take 1 tablet by mouth daily.    [name] terazosin (HYTRIN) 10 MG capsule Take 10 mg by mouth at bedtime.    . Insulin Pen Needle (NOVOFINE) 30G X 8 MM MISC Inject SQ as directed twice daily, use new pen needle for each injection 2 each 11    Allergies: Allergies as of 06/13/2014 - Review Complete 06/13/2014  Allergen Reaction Noted  . Amoxicillin Swelling and Other (See Comments) 04/21/2012  . Pravastatin Other (See Comments) 05/26/2014  . Tamsulosin Other (See Comments)   . Doxycycline Rash and Other (See Comments) 12/02/2011   Past Medical History  Diagnosis Date  . 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 Hyperlipidemia LDL goal < 100 04/02/2006  . Essential hypertension 12/09/2006  . Coronary artery disease 04/02/2006    s/p 2 stents, specifics unknown   . Benign prostatic hypertrophy with nocturia 07/07/2006  . Chronic venous insufficiency 04/12/2010  . Obesity (BMI 30.0-34.9) 01/15/2012  . Constipation 05/25/2009    Intermittent   . Microcytic normochromic anemia 05/27/2006  . Seasonal allergic rhinitis 05/25/2009  . Degenerative joint disease involving multiple joints 02/02/2007  . Internal and external hemorrhoids without complication 08/20/2012   Past Surgical  History  Procedure Laterality Date  . Pilonidal cyst excision    . Eye surgery      Laser  . Fracture surgery      Fractured stump   Family History  Problem Relation Age of Onset  . Hypertension Mother   . Heart attack Father   . Breast cancer Sister   . Arthritis Sister   . Heart attack Brother   . Diabetes Brother   . Stroke Brother   . Pneumonia Daughter   . Diabetes Brother   . Alcoholism Brother   . Diabetes Brother   . Arthritis Sister     Bilateral knee replacement  . HIV Daughter   . Hypertension Daughter   .  Drug abuse Daughter   . Schizophrenia Daughter   . Hypertension Daughter   . Drug abuse Daughter   . Bipolar disorder Daughter    History   Social History  . Marital Status: Married    Spouse Name: N/A  . Number of Children: N/A  . Years of Education: N/A   Occupational History  . Not on file.   Social History Main Topics  . Smoking status: Former Smoker    Quit date: 06/16/1968  . Smokeless tobacco: Never Used  . Alcohol Use: No  . Drug Use: No  . Sexual Activity: Yes    Birth Control/ Protection: None   Other Topics Concern  . Not on file   Social History Narrative    Review of Systems: As noted in the history of present illness  Physical Exam: Blood pressure 140/78, pulse 60, resp. rate 17, height 5\' 8"  (1.727 m), weight 210 lb (95.255 kg), SpO2 96 %. General: Elderly African-American male, tired appearing, resting in bed, NAD HEENT: PERRL, EOMI, no scleral icterus, oropharynx with dry mucous membranes Cardiac: Systolic ejection murmur best noted in right upper sternal border Pulm: clear to auscultation bilaterally in the anterior lung fields, no wheezes, rales, or rhonchi Abd: soft, nontender, nondistended, BS present Ext: warm and well perfused, no pedal edema Neuro: responds to questions appropriately; moving all extremities freely   Lab results: Basic Metabolic Panel:  Recent Labs  82/95/6203/22/16 2210  NA 140  K 6.5*  CL  113*  CO2 17*  GLUCOSE 170*  BUN 57*  CREATININE 2.37*  CALCIUM 8.9   Liver Function Tests:  Recent Labs  06/13/14 2210  AST 66*  ALT 55*  ALKPHOS 121*  BILITOT 0.7  PROT 7.8  ALBUMIN 3.2*   CBC:  Recent Labs  06/13/14 2210  WBC 7.3  NEUTROABS 4.8  HGB 10.6*  HCT 32.8*  MCV 72.9*  PLT 122*   Coagulation:  Recent Labs  06/13/14 2210  LABPROT 14.9  INR 1.16     Imaging results:  Dg Chest 1 View  06/13/2014   CLINICAL DATA:  Fall.  Left hip pain.  Left femoral neck fracture.  EXAM: CHEST  1 VIEW  COMPARISON:  05/24/2013  FINDINGS: Prominent right upper paratracheal stripe and mild prominence of soft tissue density above the aortic arch, both worsened from 05/24/2013. Mediastinal width in this vicinity is 9.7 cm. Coronary atherosclerotic calcification is visible. Borderline cardiomegaly. No edema.  IMPRESSION: 1. Increased prominence of upper mediastinal soft tissues. I cannot exclude adenopathy or mass given the change from prior, although some of the appearance is probably attributable to the AP supine technique. Given that an PA upright view of the chest may be problematic due to the patient's hip fracture currently, chest CT should be considered. 2. Borderline cardiomegaly.   Electronically Signed   By: Gaylyn RongWalter  Liebkemann M.D.   On: 06/13/2014 22:22   Dg Hip Unilat With Pelvis 2-3 Views Left  06/13/2014   CLINICAL DATA:  Fall tonight with left hip pain  EXAM: LEFT HIP (WITH PELVIS) 2-3 VIEWS  COMPARISON:  None.  FINDINGS: There is a mildly displaced acute fracture of the left femoral neck, mainly transcervical. The left femoral head remains located.  No evidence of pelvic ring fracture or diastasis.  Osteopenia.  The right hip is negative.  IMPRESSION: Mildly displaced left femoral neck fracture.   Electronically Signed   By: Marnee SpringJonathon  Watts M.D.   On: 06/13/2014 21:59    Other results:  EKG: Reviewed and compared with 05/25/2013 Left axis deviation, stable from  prior Left and right bundle branch block, stable from prior  Assessment & Plan by Problem:  Displaced left femoral neck fracture: As noted on imaging done in the emergency department. No loss of consciousness, numbness/tingling, facial droop, change in speech is reassuring for an acute neurologic process, like syncope or stroke. Hemoglobin also stable. -Consult orthopedics -Consult PT/OT -Give Dilaudid 0.5-1 mg every 3 hours as needed for pain -Continue Colace 100 mg twice daily for prophylaxis against side effect of opioid -Continue Foley  Hyperkalemia: Treated as noted in the history of present illness. He appears to have a history of intermittent hyperkalemia getting as far back as 2008 per chart review and presumably the reason why his lisinopril was discontinued and later restarted. EKG findings reassuring. -Recheck BMET in the morning -Monitor on telemetry  Type 2 diabetes complicated by retinopathy, peripheral vascular disease: Glucose 170 on admission. Last A1c 6.3, 05/26/2014, the confounded by his comorbid kidney disease. Home insulin regimen is NovoLog 70/3030 units twice daily. -Start moderate sliding scale insulin  Acute on chronic kidney disease stage III: Creatinine 2.4 on admission, baseline 1.5-1.9. Likely in the setting of dehydration as he noted being outside for many hours earlier today and recent diarrheal illness. Consistent with physical exam findings of dry mucous members. -Follow-up UA, urine culture ordered in the ED -Hold lisinopril  Abnormal LFTs: Alkaline phosphatase 121, AST 66, ALT 55 on admission. Likely in the setting of his acute illness. PT/INR reassuring. -Recheck LFTs  Hypertension: Home medications include amlodipine 10 mg, Coreg 12.5 mg twice daily, terazosin 10 mg at bedtime, lisinopril 10 mg. -Continue Coreg, amlodipine, terazosin   Microcytic anemia: Hemoglobin 10.6 on admission, baseline 10-11. MCV 72.9 on admission, baseline low 70s. Anemia panel  07/07/2006 notable for iron 34, ferritin 165, TIBC 313 suggestive of iron deficiency though may be confounded given his chronic kidney disease. Colonoscopy 02/19/2006 notable for 5 mm sessile polyp in the rectum without signs of malignancy per biopsy along with internal and external hemorrhoids. 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 Hemoglobin electrophoresis 07/07/2006 unremarkable for thalassemia. He was typed and screened the ED. -Repeat CBC and can consider anemia panel  Thrombocytopenia: Platelets 122 on admission, baseline mostly 100-150. -CBC as noted above  Coronary artery disease: Continue aspirin 81 mg.  Benign prostatic hypertrophy with nocturia: Terazosin as noted above.  #FEN:  -Diet: Nothing by mouth  #DVT prophylaxis: Heparin  #CODE STATUS: FULL CODE -Defer to Drinda Butts [[phone]] if patients lacks decision-making capacity -Confirmed with patient on admission  Dispo: Disposition is deferred at this time, awaiting improvement of current medical problems.  The patient does have a current PCP Doneen Poisson, MD) and does need an [facility] hospital follow-up appointment after discharge.  The patient does have transportation limitations that hinder transportation to clinic appointments.  Signed: Beather Arbour, MD 06/13/2014, 11:28 PM

## 2014-06-13 NOTE — ED Notes (Signed)
Per EMS- pt wheelchair had died on his ramp outside. He got out of the wheelchair to try and push it up the ramp but then fell. Denies neck or back pain. R AKA with prosthetic. Reports left sided hip pain. Decreased ROM to left leg.

## 2014-06-14 ENCOUNTER — Inpatient Hospital Stay (HOSPITAL_COMMUNITY): Payer: Medicare PPO | Admitting: Certified Registered Nurse Anesthetist

## 2014-06-14 ENCOUNTER — Encounter (HOSPITAL_COMMUNITY): Payer: Self-pay | Admitting: General Practice

## 2014-06-14 ENCOUNTER — Inpatient Hospital Stay (HOSPITAL_COMMUNITY): Payer: Medicare PPO

## 2014-06-14 ENCOUNTER — Encounter (HOSPITAL_COMMUNITY)
Admission: EM | Disposition: A | Discharge status: Skilled Nursing Facility | Payer: Self-pay | Source: Home / Self Care | Attending: Internal Medicine

## 2014-06-14 DIAGNOSIS — R7989 Other specified abnormal findings of blood chemistry: Secondary | ICD-10-CM

## 2014-06-14 DIAGNOSIS — D696 Thrombocytopenia, unspecified: Secondary | ICD-10-CM | POA: Diagnosis present

## 2014-06-14 DIAGNOSIS — Z888 Allergy status to other drugs, medicaments and biological substances status: Secondary | ICD-10-CM | POA: Diagnosis not present

## 2014-06-14 DIAGNOSIS — Y92007 Garden or yard of unspecified non-institutional (private) residence as the place of occurrence of the external cause: Secondary | ICD-10-CM | POA: Diagnosis not present

## 2014-06-14 DIAGNOSIS — M159 Polyosteoarthritis, unspecified: Secondary | ICD-10-CM | POA: Diagnosis present

## 2014-06-14 DIAGNOSIS — N400 Enlarged prostate without lower urinary tract symptoms: Secondary | ICD-10-CM

## 2014-06-14 DIAGNOSIS — Z794 Long term (current) use of insulin: Secondary | ICD-10-CM | POA: Diagnosis not present

## 2014-06-14 DIAGNOSIS — K219 Gastro-esophageal reflux disease without esophagitis: Secondary | ICD-10-CM | POA: Diagnosis present

## 2014-06-14 DIAGNOSIS — N179 Acute kidney failure, unspecified: Secondary | ICD-10-CM

## 2014-06-14 DIAGNOSIS — R509 Fever, unspecified: Secondary | ICD-10-CM | POA: Diagnosis not present

## 2014-06-14 DIAGNOSIS — D62 Acute posthemorrhagic anemia: Secondary | ICD-10-CM | POA: Diagnosis present

## 2014-06-14 DIAGNOSIS — Z823 Family history of stroke: Secondary | ICD-10-CM | POA: Diagnosis not present

## 2014-06-14 DIAGNOSIS — E11319 Type 2 diabetes mellitus with unspecified diabetic retinopathy without macular edema: Secondary | ICD-10-CM | POA: Diagnosis present

## 2014-06-14 DIAGNOSIS — E875 Hyperkalemia: Secondary | ICD-10-CM

## 2014-06-14 DIAGNOSIS — E1149 Type 2 diabetes mellitus with other diabetic neurological complication: Secondary | ICD-10-CM | POA: Diagnosis present

## 2014-06-14 DIAGNOSIS — Z8249 Family history of ischemic heart disease and other diseases of the circulatory system: Secondary | ICD-10-CM | POA: Diagnosis not present

## 2014-06-14 DIAGNOSIS — S72002A Fracture of unspecified part of neck of left femur, initial encounter for closed fracture: Secondary | ICD-10-CM | POA: Diagnosis present

## 2014-06-14 DIAGNOSIS — I739 Peripheral vascular disease, unspecified: Secondary | ICD-10-CM | POA: Diagnosis present

## 2014-06-14 DIAGNOSIS — I2510 Atherosclerotic heart disease of native coronary artery without angina pectoris: Secondary | ICD-10-CM | POA: Diagnosis present

## 2014-06-14 DIAGNOSIS — S72032A Displaced midcervical fracture of left femur, initial encounter for closed fracture: Secondary | ICD-10-CM | POA: Diagnosis present

## 2014-06-14 DIAGNOSIS — Z89611 Acquired absence of right leg above knee: Secondary | ICD-10-CM | POA: Diagnosis not present

## 2014-06-14 DIAGNOSIS — N401 Enlarged prostate with lower urinary tract symptoms: Secondary | ICD-10-CM | POA: Diagnosis present

## 2014-06-14 DIAGNOSIS — Z881 Allergy status to other antibiotic agents status: Secondary | ICD-10-CM | POA: Diagnosis not present

## 2014-06-14 DIAGNOSIS — Z880 Allergy status to penicillin: Secondary | ICD-10-CM | POA: Diagnosis not present

## 2014-06-14 DIAGNOSIS — E1159 Type 2 diabetes mellitus with other circulatory complications: Secondary | ICD-10-CM

## 2014-06-14 DIAGNOSIS — I129 Hypertensive chronic kidney disease with stage 1 through stage 4 chronic kidney disease, or unspecified chronic kidney disease: Secondary | ICD-10-CM | POA: Diagnosis present

## 2014-06-14 DIAGNOSIS — D509 Iron deficiency anemia, unspecified: Secondary | ICD-10-CM | POA: Diagnosis present

## 2014-06-14 DIAGNOSIS — E1139 Type 2 diabetes mellitus with other diabetic ophthalmic complication: Secondary | ICD-10-CM | POA: Diagnosis not present

## 2014-06-14 DIAGNOSIS — R351 Nocturia: Secondary | ICD-10-CM | POA: Diagnosis present

## 2014-06-14 DIAGNOSIS — Z833 Family history of diabetes mellitus: Secondary | ICD-10-CM | POA: Diagnosis not present

## 2014-06-14 DIAGNOSIS — Z7982 Long term (current) use of aspirin: Secondary | ICD-10-CM | POA: Diagnosis not present

## 2014-06-14 DIAGNOSIS — R5082 Postprocedural fever: Secondary | ICD-10-CM | POA: Diagnosis not present

## 2014-06-14 DIAGNOSIS — E1122 Type 2 diabetes mellitus with diabetic chronic kidney disease: Secondary | ICD-10-CM | POA: Diagnosis present

## 2014-06-14 DIAGNOSIS — D631 Anemia in chronic kidney disease: Secondary | ICD-10-CM | POA: Diagnosis present

## 2014-06-14 DIAGNOSIS — N183 Chronic kidney disease, stage 3 (moderate): Secondary | ICD-10-CM | POA: Diagnosis present

## 2014-06-14 DIAGNOSIS — E785 Hyperlipidemia, unspecified: Secondary | ICD-10-CM | POA: Diagnosis present

## 2014-06-14 DIAGNOSIS — Z79899 Other long term (current) drug therapy: Secondary | ICD-10-CM | POA: Diagnosis not present

## 2014-06-14 DIAGNOSIS — E860 Dehydration: Secondary | ICD-10-CM | POA: Diagnosis present

## 2014-06-14 DIAGNOSIS — Z87891 Personal history of nicotine dependence: Secondary | ICD-10-CM | POA: Diagnosis not present

## 2014-06-14 DIAGNOSIS — I10 Essential (primary) hypertension: Secondary | ICD-10-CM

## 2014-06-14 HISTORY — DX: Fracture of unspecified part of neck of left femur, initial encounter for closed fracture: S72.002A

## 2014-06-14 HISTORY — PX: TOTAL HIP ARTHROPLASTY: SHX124

## 2014-06-14 LAB — BASIC METABOLIC PANEL
Anion gap: 5 (ref 5–15)
BUN: 55 mg/dL — AB (ref 6–23)
CO2: 22 mmol/L (ref 19–32)
Calcium: 8.7 mg/dL (ref 8.4–10.5)
Chloride: 117 mmol/L — ABNORMAL HIGH (ref 96–112)
Creatinine, Ser: 2.16 mg/dL — ABNORMAL HIGH (ref 0.50–1.35)
GFR, EST AFRICAN AMERICAN: 34 mL/min — AB (ref >90)
GFR, EST NON AFRICAN AMERICAN: 29 mL/min — AB (ref >90)
Glucose, Bld: 160 mg/dL — ABNORMAL HIGH (ref 70–99)
Potassium: 5 mmol/L (ref 3.5–5.1)
SODIUM: 144 mmol/L (ref 135–145)

## 2014-06-14 LAB — SURGICAL PCR SCREEN
MRSA, PCR: NEGATIVE
Staphylococcus aureus: NEGATIVE

## 2014-06-14 LAB — URINALYSIS, ROUTINE W REFLEX MICROSCOPIC
Bilirubin Urine: NEGATIVE
GLUCOSE, UA: NEGATIVE mg/dL
HGB URINE DIPSTICK: NEGATIVE
KETONES UR: NEGATIVE mg/dL
LEUKOCYTES UA: NEGATIVE
Nitrite: NEGATIVE
PH: 5 (ref 5.0–8.0)
PROTEIN: 100 mg/dL — AB
Specific Gravity, Urine: 1.013 (ref 1.005–1.030)
Urobilinogen, UA: 0.2 mg/dL (ref 0.0–1.0)

## 2014-06-14 LAB — CBC
HCT: 31.8 % — ABNORMAL LOW (ref 39.0–52.0)
HEMOGLOBIN: 10.3 g/dL — AB (ref 13.0–17.0)
MCH: 23.5 pg — ABNORMAL LOW (ref 26.0–34.0)
MCHC: 32.4 g/dL (ref 30.0–36.0)
MCV: 72.6 fL — ABNORMAL LOW (ref 78.0–100.0)
PLATELETS: 114 10*3/uL — AB (ref 150–400)
RBC: 4.38 MIL/uL (ref 4.22–5.81)
RDW: 14.6 % (ref 11.5–15.5)
WBC: 7.3 10*3/uL (ref 4.0–10.5)

## 2014-06-14 LAB — GLUCOSE, CAPILLARY
Glucose-Capillary: 153 mg/dL — ABNORMAL HIGH (ref 70–99)
Glucose-Capillary: 150 mg/dL — ABNORMAL HIGH (ref 70–99)
Glucose-Capillary: 116 mg/dL — ABNORMAL HIGH (ref 70–99)
Glucose-Capillary: 117 mg/dL — ABNORMAL HIGH (ref 70–99)
Glucose-Capillary: 153 mg/dL — ABNORMAL HIGH (ref 70–99)
Glucose-Capillary: 258 mg/dL — ABNORMAL HIGH (ref 70–99)

## 2014-06-14 LAB — URINE MICROSCOPIC-ADD ON

## 2014-06-14 LAB — HEPATIC FUNCTION PANEL
ALT: 52 U/L (ref 0–53)
AST: 60 U/L — AB (ref 0–37)
Albumin: 2.9 g/dL — ABNORMAL LOW (ref 3.5–5.2)
Alkaline Phosphatase: 118 U/L — ABNORMAL HIGH (ref 39–117)
BILIRUBIN DIRECT: 0.3 mg/dL (ref 0.0–0.5)
Indirect Bilirubin: 0.6 mg/dL (ref 0.3–0.9)
TOTAL PROTEIN: 6.9 g/dL (ref 6.0–8.3)
Total Bilirubin: 0.9 mg/dL (ref 0.3–1.2)

## 2014-06-14 SURGERY — ARTHROPLASTY, HIP, TOTAL, ANTERIOR APPROACH
Anesthesia: General | Site: Hip | Laterality: Left

## 2014-06-14 MED ORDER — ASPIRIN EC 81 MG PO TBEC
81.0000 mg | DELAYED_RELEASE_TABLET | Freq: Every morning | ORAL | Status: DC
  Filled 2014-06-14: qty 1

## 2014-06-14 MED ORDER — METOCLOPRAMIDE HCL 10 MG PO TABS: 5.0000 mg | ORAL_TABLET | Freq: Three times a day (TID) | ORAL | Status: DC | PRN

## 2014-06-14 MED ORDER — MORPHINE SULFATE 2 MG/ML IJ SOLN
0.5000 mg | INTRAMUSCULAR | Status: DC | PRN
  Administered 2014-06-16: 0.5 mg via INTRAVENOUS
  Filled 2014-06-14: qty 1

## 2014-06-14 MED ORDER — HYPROMELLOSE (GONIOSCOPIC) 2.5 % OP SOLN: 2.0000 [drp] | Freq: Four times a day (QID) | OPHTHALMIC | Status: DC | PRN

## 2014-06-14 MED ORDER — PHENYLEPHRINE HCL 10 MG/ML IJ SOLN
INTRAMUSCULAR | Status: DC | PRN
  Administered 2014-06-14: 80 ug via INTRAVENOUS
  Administered 2014-06-14 (×2): 40 ug via INTRAVENOUS

## 2014-06-14 MED ORDER — SODIUM CHLORIDE 0.9 % IV SOLN
INTRAVENOUS | Status: AC
  Administered 2014-06-14: 04:00:00 via INTRAVENOUS

## 2014-06-14 MED ORDER — SODIUM CHLORIDE 0.9 % IV BOLUS (SEPSIS)
1000.0000 mL | Freq: Once | INTRAVENOUS | Status: AC
  Administered 2014-06-14: 1000 mL via INTRAVENOUS

## 2014-06-14 MED ORDER — ENOXAPARIN SODIUM 30 MG/0.3ML ~~LOC~~ SOLN
30.0000 mg | SUBCUTANEOUS | Status: DC
  Administered 2014-06-15 – 2014-06-18 (×4): 30 mg via SUBCUTANEOUS
  Filled 2014-06-14 (×5): qty 0.3

## 2014-06-14 MED ORDER — LIDOCAINE HCL (CARDIAC) 20 MG/ML IV SOLN
INTRAVENOUS | Status: DC | PRN
  Administered 2014-06-14: 100 mg via INTRAVENOUS

## 2014-06-14 MED ORDER — SODIUM CHLORIDE 0.9 % IR SOLN
Status: DC | PRN
  Administered 2014-06-14: 3000 mL

## 2014-06-14 MED ORDER — DEXTROSE 5 % IV SOLN
INTRAVENOUS | Status: DC | PRN
  Administered 2014-06-14: 15:00:00 via INTRAVENOUS

## 2014-06-14 MED ORDER — FENTANYL CITRATE 0.05 MG/ML IJ SOLN: 25.0000 ug | INTRAMUSCULAR | Status: DC | PRN

## 2014-06-14 MED ORDER — GLYCOPYRROLATE 0.2 MG/ML IJ SOLN
INTRAMUSCULAR | Status: AC
  Filled 2014-06-14: qty 3

## 2014-06-14 MED ORDER — ONDANSETRON HCL 4 MG/2ML IJ SOLN
INTRAMUSCULAR | Status: DC | PRN
  Administered 2014-06-14: 4 mg via INTRAVENOUS

## 2014-06-14 MED ORDER — ONDANSETRON HCL 4 MG/2ML IJ SOLN: 4.0000 mg | Freq: Four times a day (QID) | INTRAMUSCULAR | Status: DC | PRN

## 2014-06-14 MED ORDER — DOCUSATE SODIUM 100 MG PO CAPS
100.0000 mg | ORAL_CAPSULE | Freq: Two times a day (BID) | ORAL | Status: DC
  Administered 2014-06-14 – 2014-06-15 (×4): 100 mg via ORAL
  Filled 2014-06-14 (×6): qty 1

## 2014-06-14 MED ORDER — MIDAZOLAM HCL 2 MG/2ML IJ SOLN
INTRAMUSCULAR | Status: AC
  Filled 2014-06-14: qty 2

## 2014-06-14 MED ORDER — CARVEDILOL 12.5 MG PO TABS
12.5000 mg | ORAL_TABLET | Freq: Two times a day (BID) | ORAL | Status: DC
  Administered 2014-06-14 – 2014-06-18 (×8): 12.5 mg via ORAL
  Filled 2014-06-14 (×11): qty 1

## 2014-06-14 MED ORDER — ACETAMINOPHEN 325 MG PO TABS
650.0000 mg | ORAL_TABLET | Freq: Four times a day (QID) | ORAL | Status: DC | PRN
  Administered 2014-06-15 (×2): 650 mg via ORAL
  Filled 2014-06-14 (×2): qty 2

## 2014-06-14 MED ORDER — 0.9 % SODIUM CHLORIDE (POUR BTL) OPTIME
TOPICAL | Status: DC | PRN
  Administered 2014-06-14: 1000 mL

## 2014-06-14 MED ORDER — FENTANYL CITRATE 0.05 MG/ML IJ SOLN
INTRAMUSCULAR | Status: AC
  Filled 2014-06-14: qty 5

## 2014-06-14 MED ORDER — METOCLOPRAMIDE HCL 5 MG/ML IJ SOLN
5.0000 mg | Freq: Three times a day (TID) | INTRAMUSCULAR | Status: DC | PRN
  Filled 2014-06-14: qty 2

## 2014-06-14 MED ORDER — MEPERIDINE HCL 25 MG/ML IJ SOLN: 6.2500 mg | INTRAMUSCULAR | Status: DC | PRN

## 2014-06-14 MED ORDER — CHLORHEXIDINE GLUCONATE 4 % EX LIQD
60.0000 mL | Freq: Once | CUTANEOUS | Status: DC
  Filled 2014-06-14: qty 60

## 2014-06-14 MED ORDER — PHENOL 1.4 % MT LIQD
1.0000 | OROMUCOSAL | Status: DC | PRN
  Administered 2014-06-15: 1 via OROMUCOSAL
  Filled 2014-06-14: qty 177

## 2014-06-14 MED ORDER — GLYCOPYRROLATE 0.2 MG/ML IJ SOLN
INTRAMUSCULAR | Status: DC | PRN
  Administered 2014-06-14: .8 mg via INTRAVENOUS

## 2014-06-14 MED ORDER — HYDROCODONE-ACETAMINOPHEN 5-325 MG PO TABS
1.0000 | ORAL_TABLET | Freq: Four times a day (QID) | ORAL | Status: DC | PRN
  Administered 2014-06-15: 2 via ORAL
  Administered 2014-06-17: 1 via ORAL
  Filled 2014-06-14 (×2): qty 1

## 2014-06-14 MED ORDER — ACETAMINOPHEN 650 MG RE SUPP: 650.0000 mg | Freq: Four times a day (QID) | RECTAL | Status: DC | PRN

## 2014-06-14 MED ORDER — HYDROMORPHONE HCL 1 MG/ML IJ SOLN
0.5000 mg | INTRAMUSCULAR | Status: DC | PRN
  Administered 2014-06-14: 0.5 mg via INTRAVENOUS
  Filled 2014-06-14 (×2): qty 1

## 2014-06-14 MED ORDER — NEOSTIGMINE METHYLSULFATE 10 MG/10ML IV SOLN
INTRAVENOUS | Status: DC | PRN
  Administered 2014-06-14: 5 mg via INTRAVENOUS

## 2014-06-14 MED ORDER — NEOSTIGMINE METHYLSULFATE 10 MG/10ML IV SOLN
INTRAVENOUS | Status: AC
  Filled 2014-06-14: qty 1

## 2014-06-14 MED ORDER — HEPARIN SODIUM (PORCINE) 5000 UNIT/ML IJ SOLN: 5000.0000 [IU] | Freq: Three times a day (TID) | INTRAMUSCULAR | Status: DC

## 2014-06-14 MED ORDER — OXYCODONE HCL 5 MG PO TABS: 5.0000 mg | ORAL_TABLET | ORAL | Status: DC | PRN

## 2014-06-14 MED ORDER — TERAZOSIN HCL 5 MG PO CAPS
10.0000 mg | ORAL_CAPSULE | Freq: Every day | ORAL | Status: DC
  Administered 2014-06-14 – 2014-06-17 (×4): 10 mg via ORAL
  Filled 2014-06-14 (×6): qty 2

## 2014-06-14 MED ORDER — VANCOMYCIN HCL IN DEXTROSE 1-5 GM/200ML-% IV SOLN: 1000.0000 mg | INTRAVENOUS | Status: DC

## 2014-06-14 MED ORDER — METHOCARBAMOL 500 MG PO TABS
500.0000 mg | ORAL_TABLET | Freq: Four times a day (QID) | ORAL | Status: DC | PRN
  Filled 2014-06-14: qty 1

## 2014-06-14 MED ORDER — VANCOMYCIN HCL IN DEXTROSE 1-5 GM/200ML-% IV SOLN
1000.0000 mg | INTRAVENOUS | Status: AC
  Administered 2014-06-14: 1000 mg via INTRAVENOUS
  Filled 2014-06-14: qty 200

## 2014-06-14 MED ORDER — ALUM & MAG HYDROXIDE-SIMETH 200-200-20 MG/5ML PO SUSP
30.0000 mL | ORAL | Status: DC | PRN
  Administered 2014-06-15 – 2014-06-17 (×4): 30 mL via ORAL
  Filled 2014-06-14 (×4): qty 30

## 2014-06-14 MED ORDER — METHOCARBAMOL 1000 MG/10ML IJ SOLN
500.0000 mg | Freq: Four times a day (QID) | INTRAVENOUS | Status: DC | PRN
  Filled 2014-06-14: qty 5

## 2014-06-14 MED ORDER — HYDROMORPHONE HCL 1 MG/ML IJ SOLN
0.5000 mg | INTRAMUSCULAR | Status: DC | PRN
  Administered 2014-06-14: 1 mg via INTRAVENOUS

## 2014-06-14 MED ORDER — SODIUM CHLORIDE 0.9 % IV SOLN
INTRAVENOUS | Status: DC | PRN
  Administered 2014-06-14: 14:00:00 via INTRAVENOUS
  Administered 2014-06-14: 500 mL
  Administered 2014-06-14: 15:00:00 via INTRAVENOUS

## 2014-06-14 MED ORDER — FENTANYL CITRATE 0.05 MG/ML IJ SOLN
INTRAMUSCULAR | Status: DC | PRN
  Administered 2014-06-14 (×3): 50 ug via INTRAVENOUS

## 2014-06-14 MED ORDER — GLYCOPYRROLATE 0.2 MG/ML IJ SOLN
INTRAMUSCULAR | Status: AC
  Filled 2014-06-14: qty 1

## 2014-06-14 MED ORDER — PROPOFOL 10 MG/ML IV BOLUS
INTRAVENOUS | Status: DC | PRN
  Administered 2014-06-14: 120 mg via INTRAVENOUS

## 2014-06-14 MED ORDER — AMLODIPINE BESYLATE 10 MG PO TABS
10.0000 mg | ORAL_TABLET | Freq: Every morning | ORAL | Status: DC
  Administered 2014-06-14 – 2014-06-18 (×5): 10 mg via ORAL
  Filled 2014-06-14 (×5): qty 1

## 2014-06-14 MED ORDER — ONDANSETRON HCL 4 MG PO TABS: 4.0000 mg | ORAL_TABLET | Freq: Four times a day (QID) | ORAL | Status: DC | PRN

## 2014-06-14 MED ORDER — PROMETHAZINE HCL 25 MG/ML IJ SOLN
12.5000 mg | Freq: Four times a day (QID) | INTRAMUSCULAR | Status: DC | PRN
  Administered 2014-06-14: 12.5 mg via INTRAVENOUS
  Filled 2014-06-14: qty 1

## 2014-06-14 MED ORDER — PROMETHAZINE HCL 25 MG/ML IJ SOLN: 6.2500 mg | INTRAMUSCULAR | Status: DC | PRN

## 2014-06-14 MED ORDER — INSULIN ASPART 100 UNIT/ML ~~LOC~~ SOLN
0.0000 [IU] | SUBCUTANEOUS | Status: DC
  Administered 2014-06-14: 8 [IU] via SUBCUTANEOUS
  Administered 2014-06-14: 2 [IU] via SUBCUTANEOUS
  Administered 2014-06-14 – 2014-06-15 (×4): 3 [IU] via SUBCUTANEOUS
  Administered 2014-06-15: 8 [IU] via SUBCUTANEOUS
  Administered 2014-06-15 (×2): 5 [IU] via SUBCUTANEOUS
  Administered 2014-06-16 (×2): 3 [IU] via SUBCUTANEOUS

## 2014-06-14 MED ORDER — ENOXAPARIN SODIUM 30 MG/0.3ML ~~LOC~~ SOLN: 30.0000 mg | SUBCUTANEOUS | Status: DC

## 2014-06-14 MED ORDER — SODIUM CHLORIDE 0.9 % IV SOLN
INTRAVENOUS | Status: DC
  Administered 2014-06-14 – 2014-06-15 (×3): via INTRAVENOUS

## 2014-06-14 MED ORDER — SODIUM CHLORIDE 0.9 % IV SOLN
INTRAVENOUS | Status: DC
  Administered 2014-06-15 – 2014-06-17 (×4): via INTRAVENOUS

## 2014-06-14 MED ORDER — SODIUM CHLORIDE 0.9 % IV SOLN: INTRAVENOUS | Status: DC

## 2014-06-14 MED ORDER — ROCURONIUM BROMIDE 100 MG/10ML IV SOLN
INTRAVENOUS | Status: DC | PRN
  Administered 2014-06-14: 40 mg via INTRAVENOUS
  Administered 2014-06-14: 5 mg via INTRAVENOUS

## 2014-06-14 MED ORDER — VANCOMYCIN HCL IN DEXTROSE 1-5 GM/200ML-% IV SOLN
1000.0000 mg | Freq: Two times a day (BID) | INTRAVENOUS | Status: AC
  Administered 2014-06-14 – 2014-06-15 (×2): 1000 mg via INTRAVENOUS
  Filled 2014-06-14 (×2): qty 200

## 2014-06-14 MED ORDER — SODIUM CHLORIDE 0.9 % IJ SOLN
3.0000 mL | Freq: Two times a day (BID) | INTRAMUSCULAR | Status: DC
  Administered 2014-06-14 – 2014-06-17 (×2): 3 mL via INTRAVENOUS

## 2014-06-14 MED ORDER — MENTHOL 3 MG MT LOZG
1.0000 | LOZENGE | OROMUCOSAL | Status: DC | PRN
  Administered 2014-06-15: 3 mg via ORAL
  Filled 2014-06-14: qty 9

## 2014-06-14 SURGICAL SUPPLY — 46 items
BLADE SAW SGTL 18X1.27X75 (BLADE) ×2 IMPLANT
BLADE SAW SGTL 18X1.27X75MM (BLADE) ×1
BNDG COHESIVE 6X5 TAN STRL LF (GAUZE/BANDAGES/DRESSINGS) ×3 IMPLANT
CAPT HIP HEMI 2B ×2 IMPLANT
CELLS DAT CNTRL 66122 CELL SVR (MISCELLANEOUS) ×1 IMPLANT
DRAPE C-ARM 42X72 X-RAY (DRAPES) ×3 IMPLANT
DRAPE IMP U-DRAPE 54X76 (DRAPES) ×3 IMPLANT
DRAPE STERI IOBAN 125X83 (DRAPES) ×3 IMPLANT
DRAPE U-SHAPE 47X51 STRL (DRAPES) ×9 IMPLANT
DRSG AQUACEL AG ADV 3.5X10 (GAUZE/BANDAGES/DRESSINGS) ×3 IMPLANT
DURAPREP 26ML APPLICATOR (WOUND CARE) ×3 IMPLANT
ELECT BLADE 4.0 EZ CLEAN MEGAD (MISCELLANEOUS) ×3
ELECT REM PT RETURN 9FT ADLT (ELECTROSURGICAL) ×3
ELECTRODE BLDE 4.0 EZ CLN MEGD (MISCELLANEOUS) ×1 IMPLANT
ELECTRODE REM PT RTRN 9FT ADLT (ELECTROSURGICAL) ×1 IMPLANT
FACESHIELD WRAPAROUND (MASK) ×3 IMPLANT
FACESHIELD WRAPAROUND OR TEAM (MASK) ×1 IMPLANT
GLOVE NEODERM STRL 7.5 LF PF (GLOVE) ×2 IMPLANT
GLOVE SURG NEODERM 7.5  LF PF (GLOVE) ×4
GLOVE SURG SYN 7.5  E (GLOVE) ×2
GLOVE SURG SYN 7.5 E (GLOVE) ×1 IMPLANT
GLOVE SURG SYN 7.5 PF PI (GLOVE) ×1 IMPLANT
GOWN SRG XL XLNG 56XLVL 4 (GOWN DISPOSABLE) ×2 IMPLANT
GOWN STRL NON-REIN XL XLG LVL4 (GOWN DISPOSABLE) ×6
HANDPIECE INTERPULSE COAX TIP (DISPOSABLE) ×3
KIT BASIN OR (CUSTOM PROCEDURE TRAY) ×3 IMPLANT
MARKER SKIN DUAL TIP RULER LAB (MISCELLANEOUS) ×3 IMPLANT
PACK TOTAL JOINT (CUSTOM PROCEDURE TRAY) ×3 IMPLANT
PACK UNIVERSAL I (CUSTOM PROCEDURE TRAY) ×3 IMPLANT
PADDING CAST COTTON 6X4 STRL (CAST SUPPLIES) ×3 IMPLANT
RETRACTOR WND ALEXIS 18 MED (MISCELLANEOUS) ×1 IMPLANT
RTRCTR WOUND ALEXIS 18CM MED (MISCELLANEOUS) ×3
SEALER BIPOLAR AQUA 6.0 (INSTRUMENTS) ×2 IMPLANT
SET HNDPC FAN SPRY TIP SCT (DISPOSABLE) ×1 IMPLANT
SOLUTION BETADINE 4OZ (MISCELLANEOUS) ×3 IMPLANT
SUT ETHIBOND NAB CT1 #1 30IN (SUTURE) ×9 IMPLANT
SUT ETHILON 3 0 FSL (SUTURE) ×3 IMPLANT
SUT VIC AB 0 CT1 27 (SUTURE) ×3
SUT VIC AB 0 CT1 27XBRD ANBCTR (SUTURE) ×1 IMPLANT
SUT VIC AB 1 CT1 27 (SUTURE) ×6
SUT VIC AB 1 CT1 27XBRD ANBCTR (SUTURE) ×1 IMPLANT
SUT VIC AB 2-0 CT1 27 (SUTURE) ×6
SUT VIC AB 2-0 CT1 TAPERPNT 27 (SUTURE) ×2 IMPLANT
SYR 20CC LL (SYRINGE) ×3 IMPLANT
TOWEL OR 17X26 10 PK STRL BLUE (TOWEL DISPOSABLE) ×3 IMPLANT
TRAY FOLEY CATH 16FR SILVER (SET/KITS/TRAYS/PACK) ×3 IMPLANT

## 2014-06-14 NOTE — ED Notes (Signed)
MD at bedside. 

## 2014-06-14 NOTE — Discharge Instructions (Signed)
° ° °  1. Change dressings as needed °2. May shower but keep incisions covered and dry °3. Take lovenox to prevent blood clots °4. Take stool softeners as needed °5. Take pain meds as needed ° °

## 2014-06-14 NOTE — Anesthesia Preprocedure Evaluation (Addendum)
Anesthesia Evaluation  Patient identified by MRN, date of birth, ID band Patient awake    Reviewed: Allergy & Precautions, NPO status , Patient's Chart, lab work & pertinent test results  Airway Mallampati: II   Neck ROM: Full    Dental  (+) Missing, Poor Dentition, Dental Advisory Given   Pulmonary former smoker,  breath sounds clear to auscultation        Cardiovascular hypertension, Pt. on medications + CAD Rhythm:Regular  EF 60%, Normal stress 2015   Neuro/Psych    GI/Hepatic GERD-  Medicated,  Endo/Other  diabetes, Type 2, Insulin Dependent  Renal/GU Renal InsufficiencyRenal diseaseCreat 2.1     Musculoskeletal   Abdominal (+)  Abdomen: soft.    Peds  Hematology  (+) anemia , 10/30   Anesthesia Other Findings   Reproductive/Obstetrics                          Anesthesia Physical Anesthesia Plan  ASA: III  Anesthesia Plan: General   Post-op Pain Management:    Induction: Intravenous  Airway Management Planned: Oral ETT  Additional Equipment:   Intra-op Plan:   Post-operative Plan:   Informed Consent: I have reviewed the patients History and Physical, chart, labs and discussed the procedure including the risks, benefits and alternatives for the proposed anesthesia with the patient or authorized representative who has indicated his/her understanding and acceptance.     Plan Discussed with:   Anesthesia Plan Comments:         Anesthesia Quick Evaluation

## 2014-06-14 NOTE — Op Note (Signed)
HEMI HIP ARTHROPLASTY ANTERIOR APPROACH  Procedure Note DUSTAN HYAMS   [id number]  Pre-op Diagnosis: left femoral neck fracture     Post-op Diagnosis: same   Operative Procedures  1. Prosthetic replacement for femoral neck fracture. CPT [phone]  Personnel  Surgeon(s): Tsuyako Jolley Donnelly Stager, MD   Anesthesia: general  Prosthesis: Depuy Femur: Corail KA 12 Head: 49 mm size: +1.5 Bearing Type: Bipolar  Date of Service: 06/13/2014 - 06/14/2014  Total Hip Arthroplasty (Anterior Approach) Op Note:  After informed consent was obtained and the operative extremity marked in the holding area, the patient was brought back to the operating room and placed supine on the HANA table. Next, the operative extremity was prepped and draped in normal sterile fashion. Surgical timeout occurred verifying patient identification, surgical site, surgical procedure and administration of antibiotics.  A modified anterior Smith-Peterson approach to the hip was performed, using the interval between tensor fascia lata and sartorius.  Dissection was carried bluntly down onto the anterior hip capsule. The lateral femoral circumflex vessels were identified and coagulated. A capsulotomy was performed and the capsular flaps tagged for later repair.  Fluoroscopy was utilized to prepare for the femoral neck cut. The neck osteotomy was performed. The femoral head was removed and found a 49 mm head was the appropriate fit.    We then turned our attention to the femur.  After placing the femoral hook, the leg was taken to externally rotated, extended and adducted position taking care to perform soft tissue releases to allow for adequate mobilization of the femur. Soft tissue was cleared from the shoulder of the greater trochanter and the hook elevator used to improve exposure of the proximal femur. Sequential broaching performed up to a size 12. Trial neck and head were placed. The leg was brought back up to neutral and the construct  reduced. The position and sizing of components, offset and leg lengths were checked using fluoroscopy. Stability of the construct was checked in extension and external rotation without any subluxation or impingement of prosthesis. We dislocated the prosthesis, dropped the leg back into position, removed trial components, and irrigated copiously. The final stem and head was then placed, the leg brought back up, the system reduced and fluoroscopy used to verify positioning.  We irrigated, obtained hemostasis and closed the capsule using #1 ethibond suture. The fascia was closed with #1 vicryl plus, the deep fat layer was closed with 0 vicryl, the subcutaneous layers closed with 2.0 Vicryl Plus and the skin closed with staples . A sterile dressing was applied. The patient was awakened in the operating room and taken to recovery in stable condition. All sponge, needle, and instrument counts were correct at the end of the case.   Position: supine  Complications: none.  Time Out: performed   Drains/Packing: none  Estimated blood loss: 250 cc  Returned to Recovery Room: in good condition.   Antibiotics: yes   Mechanical VTE (DVT) Prophylaxis: sequential compression devices, TED thigh-high  Chemical VTE (DVT) Prophylaxis: lovenox Fluid Replacement  Crystalloid: see anesthesia record  Specimens Removed: 1 to pathology   Sponge and Instrument Count Correct? yes   PACU: portable radiograph - low AP   Admission: inpatient status, start PT & OT POD#1  Plan/RTC: Return in 2 weeks for staple removal. Return in 6 weeks to see MD.  Weight Bearing/Load Lower Extremity: full  Hip precautions: none Suture Removal: 10-14 days  Betadine to incision twice daily once dressing is removed on POD#7  N.  Glee ArvinMichael Shawnell Dykes, MD [facility] [phone] 4:49 PM

## 2014-06-14 NOTE — Transfer of Care (Signed)
Immediate Anesthesia Transfer of Care Note  Patient: [name] [name]  Procedure(s) Performed: Procedure(s): HEMI HIP ARTHROPLASTY ANTERIOR APPROACH (Left)  Patient Location: PACU  Anesthesia Type:General  Level of Consciousness: awake  Airway & Oxygen Therapy: Patient Spontanous Breathing and Patient connected to nasal cannula oxygen  Post-op Assessment: Report given to RN  Post vital signs: Reviewed and stable  Last Vitals:  Filed Vitals:   06/14/14 1026  BP: 141/66  Pulse: 81  Temp:   Resp:     Complications: No apparent anesthesia complications

## 2014-06-14 NOTE — Anesthesia Procedure Notes (Signed)
Procedure Name: Intubation Date/Time: 06/14/2014 3:03 PM Performed by: Daiva EvesAVENEL, Onyekachi Gathright W Pre-anesthesia Checklist: Patient identified, Timeout performed, Emergency Drugs available, Suction available and Patient being monitored Patient Re-evaluated:Patient Re-evaluated prior to inductionOxygen Delivery Method: Circle system utilized Preoxygenation: Pre-oxygenation with 100% oxygen Intubation Type: IV induction Ventilation: Mask ventilation without difficulty Laryngoscope Size: Mac and 3 Grade View: Grade III Tube type: Oral Tube size: 7.5 mm Number of attempts: 1 Airway Equipment and Method: Stylet Placement Confirmation: ETT inserted through vocal cords under direct vision,  breath sounds checked- equal and bilateral,  positive ETCO2 and CO2 detector Secured at: 22 cm Tube secured with: Tape Comments: Large epiglottis

## 2014-06-14 NOTE — Progress Notes (Signed)
Utilization review complete. Shira Bobst RN CCM Case Mgmt phone [phone] 

## 2014-06-14 NOTE — ED Notes (Signed)
Pt reports numbness in R foot for past few months, also some transient numbness in R hand "like it's asleep".  Pt wonders if the numbness could have contributed to the fall.

## 2014-06-14 NOTE — Progress Notes (Signed)
Received report. Awaiting patient's arrival.  

## 2014-06-14 NOTE — Progress Notes (Signed)
Pt arrived to unit alert and oriented x4. Oriented to room, unit, and staff.  Bed in lowest position and call bell is within reach. RN spoke to patient about foley catheter insertion and patient refused. Pt states "I do not want the catheter. I've had one before and I don't like how it feels.". Will continue to monitor.

## 2014-06-14 NOTE — Consult Note (Signed)
ORTHOPAEDIC CONSULTATION  REQUESTING PHYSICIAN: Aletta EdouardShilpa Bhardwaj, MD  Chief Complaint: Left hip fracture  HPI: [name] [name] is a 69 y.o. male who complains of left hip pain s/p mechanical fall.  Denies LOC, neck pain, abd pain.  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  Mainly walks in home with walker and electric wheelchair outside of home.  Ortho consulted for left femoral neck fracture.  Past Medical History  Diagnosis Date  . 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 Hyperlipidemia LDL goal < 100 04/02/2006  . Essential hypertension 12/09/2006  . Coronary artery disease 04/02/2006    s/p 2 stents, specifics unknown   . Benign prostatic hypertrophy with nocturia 07/07/2006  . Chronic venous insufficiency 04/12/2010  . Obesity (BMI 30.0-34.9) 01/15/2012  . Constipation 05/25/2009    Intermittent   . Microcytic normochromic anemia 05/27/2006  . Seasonal allergic rhinitis 05/25/2009  . Degenerative joint disease involving multiple joints 02/02/2007  . Internal and external hemorrhoids without complication 08/20/2012   Past Surgical History  Procedure Laterality Date  . Pilonidal cyst excision    . Eye surgery      Laser  . Fracture surgery      Fractured stump   History   Social History  . Marital Status: Married    Spouse Name: N/A  . Number of Children: N/A  . Years of Education: N/A   Social History Main Topics  . Smoking status: Former Smoker    Quit date:  06/16/1968  . Smokeless tobacco: Never Used  . Alcohol Use: No  . Drug Use: No  . Sexual Activity: Yes    Birth Control/ Protection: None   Other Topics Concern  . None   Social History Narrative   Family History  Problem Relation Age of Onset  . Hypertension Mother   . Heart attack Father   . Breast cancer Sister   . Arthritis Sister   . Heart attack Brother   . Diabetes Brother   . Stroke Brother   . Pneumonia Daughter   . Diabetes Brother   . Alcoholism Brother   . Diabetes Brother   . Arthritis Sister     Bilateral knee replacement  . HIV Daughter   . Hypertension Daughter   . Drug abuse Daughter   . Schizophrenia Daughter   . Hypertension Daughter   . Drug abuse Daughter   . Bipolar disorder Daughter    Allergies  Allergen Reactions  . Amoxicillin Swelling and Other (See Comments)    Puffy eyes and abdominal pain with Amoxicillin PO  . Pravastatin Other (See Comments)    Weakness and fatigue  . Tamsulosin Other (See Comments)    REACTION: dry throat, sweating, blurred vision  . Doxycycline Rash and Other (See Comments)    Questionable drug rxn rash   Prior to Admission medications   Medication Sig Start Date End Date Taking? Authorizing Provider  amLODipine (NORVASC) 10 MG tablet Take 1 tablet (10 mg  total) by mouth every morning. 12/29/13  Yes Burns Spain, MD  aspirin EC 81 MG tablet Take 81 mg by mouth every morning.   Yes Historical Provider, MD  calcium citrate-vitamin D (CITRACAL+D) 315-200 MG-UNIT per tablet Take 1 tablet by mouth daily.    Yes Historical Provider, MD  carvedilol (COREG) 12.5 MG tablet Take 1 tablet (12.5 mg total) by mouth 2 (two) times daily with a meal. 01/05/14  Yes Doneen Poisson, MD  fish oil-omega-3 fatty acids 1000 MG capsule Take 1 g by mouth daily. 01/15/12  Yes Doneen Poisson, MD  fluticasone [facility]) 50 MCG/ACT nasal spray Place 2 sprays into both nostrils daily as needed for allergies. 12/29/13  Yes Burns Spain, MD  furosemide (LASIX) 40 MG tablet Take as needed for a weight gain of more than 5 lbs in a week. 02/10/14  Yes Doneen Poisson, MD  hydroxypropyl methylcellulose (ISOPTO TEARS) 2.5 % ophthalmic solution Place 2 drops into both eyes 4 (four) times daily as needed for dry eyes.   Yes Historical Provider, MD  insulin aspart protamine - aspart (NOVOLOG MIX 70/30 FLEXPEN) (70-30) 100 UNIT/ML FlexPen Inject 0.3 mLs (30 Units total) into the skin 2 (two) times daily. 05/16/14  Yes Doneen Poisson, MD  lisinopril (PRINIVIL,ZESTRIL) 10 MG tablet Take 1 tablet (10 mg total) by mouth every morning. 05/29/14  Yes Doneen Poisson, MD  Multiple Vitamins-Minerals (MULTIVITAMIN WITH MINERALS) tablet Take 1 tablet by mouth daily.   Yes Historical Provider, MD  terazosin (HYTRIN) 10 MG capsule Take 10 mg by mouth at bedtime.   Yes Historical Provider, MD  Insulin Pen Needle (NOVOFINE) 30G X 8 MM MISC Inject SQ as directed twice daily, use new pen needle for each injection 09/10/10   Edsel Petrin, DO   Dg Chest 1 View  06/13/2014   CLINICAL DATA:  Fall.  Left hip pain.  Left femoral neck fracture.  EXAM: CHEST  1 VIEW  COMPARISON:  05/24/2013  FINDINGS: Prominent right upper paratracheal stripe and mild prominence of soft tissue density above the aortic arch, both worsened from 05/24/2013. Mediastinal width in this vicinity is 9.7 cm. Coronary atherosclerotic calcification is visible. Borderline cardiomegaly. No edema.  IMPRESSION: 1. Increased prominence of upper mediastinal soft tissues. I cannot exclude adenopathy or mass given the change from prior, although some of the appearance is probably attributable to the AP supine technique. Given that an PA upright view of the chest may be problematic due to the patient's hip fracture currently, chest CT should be considered. 2. Borderline cardiomegaly.   Electronically Signed   By: Gaylyn Rong M.D.   On: 06/13/2014 22:22   Dg Hip Unilat With Pelvis 2-3 Views  Left  06/13/2014   CLINICAL DATA:  Fall tonight with left hip pain  EXAM: LEFT HIP (WITH PELVIS) 2-3 VIEWS  COMPARISON:  None.  FINDINGS: There is a mildly displaced acute fracture of the left femoral neck, mainly transcervical. The left femoral head remains located.  No evidence of pelvic ring fracture or diastasis.  Osteopenia.  The right hip is negative.  IMPRESSION: Mildly displaced left femoral neck fracture.   Electronically Signed   By: Marnee Spring M.D.   On: 06/13/2014 21:59    Positive ROS: All other systems have been reviewed and were otherwise negative with the exception of those mentioned in the HPI and as above.  Physical Exam: General: Alert, no acute distress Cardiovascular: No pedal edema Respiratory: No cyanosis, no use of accessory  musculature GI: No organomegaly, abdomen is soft and non-tender Skin: No lesions in the area of chief complaint Neurologic: Sensation intact distally Psychiatric: Patient is competent for consent with normal mood and affect Lymphatic: No axillary or cervical lymphadenopathy  MUSCULOSKELETAL:  - painful ROM of hip - NVI distally - thready pulses in left foot - foot wwp  Assessment: Left femoral neck fx  Plan: - recommend hip replacement for fx - patient aware of r/b/a and wishes to proceed - consent obtained - foley  - continue NPO - surgery planned for this pm - hold heparin and asa  Thank you for the consult and the opportunity to see Mr. Riling  N. Glee Arvin, MD [facility] [phone] 7:30 AM

## 2014-06-14 NOTE — Progress Notes (Signed)
Subjective: Pt complaining of left hip pain and n/v. Phenergan has helped w/ nausea.  Objective: Vital signs in last 24 hours: Filed Vitals:   06/14/14 0333 06/14/14 0333 06/14/14 0530 06/14/14 0648  BP:  111/53 176/79 128/76  Pulse:  74 82   Temp:  97.9 F (36.6 C) 98.5 F (36.9 C)   TempSrc:  Oral Oral   Resp:   18   Height:  (1.727 m)     Weight: 197 lb 5 oz (89.5 kg)     SpO2:  100% 96%    Weight change:   Intake/Output Summary (Last 24 hours) at 06/14/14 0859 Last data filed at 06/14/14 9528  Gross per 24 hour  Intake    225 ml  Output    250 ml  Net    -25 ml   General: NAD, laying in bed comfortably Lungs: clear to anterior auscultation Cardiac: RRR, no murmurs GI: soft, active bowel sounds Neuro: CN II-XII grossly intact Ext: left hip tender to palpation, no skin breaks or bruises over left hip  Lab Results: Basic Metabolic Panel:  Recent Labs Lab 06/13/14 2210 06/14/14 0440  NA 140 144  K 6.5* 5.0  CL 113* 117*  CO2 17* 22  GLUCOSE 170* 160*  BUN 57* 55*  CREATININE 2.37* 2.16*  CALCIUM 8.9 8.7   Liver Function Tests:  Recent Labs Lab 06/13/14 2210 06/14/14 0420  AST 66* 60*  ALT 55* 52  ALKPHOS 121* 118*  BILITOT 0.7 0.9  PROT 7.8 6.9  ALBUMIN 3.2* 2.9*   CBC:  Recent Labs Lab 06/13/14 2210 06/14/14 0420  WBC 7.3 7.3  NEUTROABS 4.8  --   HGB 10.6* 10.3*  HCT 32.8* 31.8*  MCV 72.9* 72.6*  PLT 122* 114*   CBG:  Recent Labs Lab 06/14/14 0359 06/14/14 0806  GLUCAP 153* 150*   Coagulation:  Recent Labs Lab 06/13/14 2210  LABPROT 14.9  INR 1.16   Urinalysis:  Recent Labs Lab 06/14/14 0127  COLORURINE YELLOW  LABSPEC 1.013  PHURINE 5.0  GLUCOSEU NEGATIVE  HGBUR NEGATIVE  BILIRUBINUR NEGATIVE  KETONESUR NEGATIVE  PROTEINUR 100*  UROBILINOGEN 0.2  NITRITE NEGATIVE  LEUKOCYTESUR NEGATIVE   Studies/Results: Dg Chest 1 View  06/13/2014   CLINICAL DATA:  Fall.  Left hip pain.  Left femoral neck  fracture.  EXAM: CHEST  1 VIEW  COMPARISON:  05/24/2013  FINDINGS: Prominent right upper paratracheal stripe and mild prominence of soft tissue density above the aortic arch, both worsened from 05/24/2013. Mediastinal width in this vicinity is 9.7 cm. Coronary atherosclerotic calcification is visible. Borderline cardiomegaly. No edema.  IMPRESSION: 1. Increased prominence of upper mediastinal soft tissues. I cannot exclude adenopathy or mass given the change from prior, although some of the appearance is probably attributable to the AP supine technique. Given that an PA upright view of the chest may be problematic due to the patient's hip fracture currently, chest CT should be considered. 2. Borderline cardiomegaly.   Electronically Signed   By: Gaylyn Rong M.D.   On: 06/13/2014 22:22   Dg Hip Unilat With Pelvis 2-3 Views Left  06/13/2014   CLINICAL DATA:  Fall tonight with left hip pain  EXAM: LEFT HIP (WITH PELVIS) 2-3 VIEWS  COMPARISON:  None.  FINDINGS: There is a mildly displaced acute fracture of the left femoral neck, mainly transcervical. The left femoral head remains located.  No evidence of pelvic ring fracture or diastasis.  Osteopenia.  The right hip is  negative.  IMPRESSION: Mildly displaced left femoral neck fracture.   Electronically Signed   By: Marnee Spring M.D.   On: 06/13/2014 21:59   Medications: I have reviewed the patient's current medications. Scheduled Meds: . amLODipine  10 mg Oral q morning - 10a  . carvedilol  12.5 mg Oral BID WC  . docusate sodium  100 mg Oral BID  . insulin aspart  0-15 Units Subcutaneous 6 times per day  . sodium chloride  3 mL Intravenous Q12H  . terazosin  10 mg Oral QHS   Continuous Infusions: . sodium chloride 100 mL/hr at 06/14/14 0410   PRN Meds:.HYDROmorphone (DILAUDID) injection, hydroxypropyl methylcellulose / hypromellose, promethazine Assessment/Plan: Principal Problem:   Closed displaced fracture of left femoral neck Active  Problems:   Anemia of chronic renal failure, stage 3 (moderate)   Essential hypertension   Type 2 diabetes mellitus with peripheral artery disease   Type 2 diabetes mellitus with stage 3 chronic kidney disease   AKI (acute kidney injury)   Left displaced femoral neck fracture   Displaced left femoral neck fracture:  - Dr. Roda Shutters w/ ortho saw patient, recommended hip replacement which will be done today at 2:30pm, pt NPO and asa + heparin held, foley order placed -PT/OT - Dilaudid 0.5-1 mg every 3 hours as needed for pain - Colace 100 mg twice daily for prophylaxis against side effect of opioid  Hyperkalemia: resolved, K+ 5.0 this morning  Type 2 diabetes complicated by retinopathy, peripheral vascular disease: Glucose 170 on admission. Last A1c 6.3, 05/26/2014, the confounded by his comorbid kidney disease. Home insulin regimen is NovoLog 70/3030 units twice daily. -moderate sliding scale insulin  Chronic kidney disease stage III: Creatinine 2.4 on admission, baseline 1.5-1.9. Creatinine 2.16 this morning.  - will continue to monitor - holding ACEinh 2/2 low BPs, can restart as needed  Abnormal LFTs: Alkaline phosphatase 121, AST 66, ALT 55 on admission. Likely in the setting of his acute illness. PT/INR reassuring. -LFTs trending down, will continue to monitor  Hypertension: Home medications include amlodipine 10 mg, Coreg 12.5 mg twice daily, terazosin 10 mg at bedtime, lisinopril 10 mg. -Continue Coreg, amlodipine, terazosin  - holding ACEinh as BP low normal, can restart as needed  Microcytic anemia w/thrombocytopenia: Hemoglobin 10.6 on admission, baseline 10-11. MCV 72.9 on admission, baseline low 70s. Anemia panel 07/07/2006 notable for iron 34, ferritin 165, TIBC 313 suggestive of iron deficiency though may be confounded given his chronic kidney disease. Colonoscopy 02/19/2006 notable for 5 mm sessile polyp in the rectum without signs of malignancy per biopsy along with internal and  external hemorrhoids. 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 Hemoglobin electrophoresis 07/07/2006 unremarkable for thalassemia. He was typed and screened the ED. -hgb stable , consistent with hbg values since 2008, will continue to monitor  Coronary artery disease: hold aspirin 81 mg for sx  Benign prostatic hypertrophy with nocturia: Terazosin as noted above.  #FEN:  -Diet: Nothing by mouth  #DVT prophylaxis: holding heparin for sx today  #CODE STATUS: FULL CODE -Defer to Drinda Butts [phone]] if patients lacks decision-making capacity -Confirmed with patient on admission  Dispo: Disposition is deferred at this time, awaiting improvement of current medical problems.  The patient does have a current PCP Doneen Poisson, MD) and does need an [facility] hospital follow-up appointment after discharge.  The patient does have transportation limitations that hinder transportation to clinic appointments.  .Services Needed at time of discharge: Y = Yes, Blank = No PT:  OT:   RN:   Equipment:   Other:     LOS: 0 days   Denton Brickiana M Tayon Parekh, MD 06/14/2014, 8:59 AM

## 2014-06-15 ENCOUNTER — Inpatient Hospital Stay (HOSPITAL_COMMUNITY): Payer: Medicare PPO

## 2014-06-15 LAB — BASIC METABOLIC PANEL
Anion gap: 5 (ref 5–15)
BUN: 51 mg/dL — ABNORMAL HIGH (ref 6–23)
CO2: 20 mmol/L (ref 19–32)
CREATININE: 2.19 mg/dL — AB (ref 0.50–1.35)
Calcium: 7.3 mg/dL — ABNORMAL LOW (ref 8.4–10.5)
Chloride: 114 mmol/L — ABNORMAL HIGH (ref 96–112)
GFR, EST AFRICAN AMERICAN: 33 mL/min — AB (ref >90)
GFR, EST NON AFRICAN AMERICAN: 29 mL/min — AB (ref >90)
Glucose, Bld: 206 mg/dL — ABNORMAL HIGH (ref 70–99)
POTASSIUM: 4.5 mmol/L (ref 3.5–5.1)
Sodium: 139 mmol/L (ref 135–145)

## 2014-06-15 LAB — URINALYSIS, ROUTINE W REFLEX MICROSCOPIC
Bilirubin Urine: NEGATIVE
Glucose, UA: NEGATIVE mg/dL
Ketones, ur: NEGATIVE mg/dL
NITRITE: NEGATIVE
Protein, ur: 30 mg/dL — AB
SPECIFIC GRAVITY, URINE: 1.015 (ref 1.005–1.030)
UROBILINOGEN UA: 1 mg/dL (ref 0.0–1.0)
pH: 5 (ref 5.0–8.0)

## 2014-06-15 LAB — CBC WITH DIFFERENTIAL/PLATELET
Basophils Absolute: 0 10*3/uL (ref 0.0–0.1)
Basophils Relative: 0 % (ref 0–1)
EOS ABS: 0.1 10*3/uL (ref 0.0–0.7)
Eosinophils Relative: 1 % (ref 0–5)
HCT: 25.5 % — ABNORMAL LOW (ref 39.0–52.0)
Hemoglobin: 8.3 g/dL — ABNORMAL LOW (ref 13.0–17.0)
LYMPHS PCT: 20 % (ref 12–46)
Lymphs Abs: 1.8 10*3/uL (ref 0.7–4.0)
MCH: 23.8 pg — AB (ref 26.0–34.0)
MCHC: 32.5 g/dL (ref 30.0–36.0)
MCV: 73.1 fL — ABNORMAL LOW (ref 78.0–100.0)
MONO ABS: 0.6 10*3/uL (ref 0.1–1.0)
Monocytes Relative: 7 % (ref 3–12)
NEUTROS PCT: 72 % (ref 43–77)
Neutro Abs: 6.4 10*3/uL (ref 1.7–7.7)
PLATELETS: 95 10*3/uL — AB (ref 150–400)
RBC: 3.49 MIL/uL — ABNORMAL LOW (ref 4.22–5.81)
RDW: 14.2 % (ref 11.5–15.5)
WBC: 8.9 10*3/uL (ref 4.0–10.5)

## 2014-06-15 LAB — GLUCOSE, CAPILLARY
GLUCOSE-CAPILLARY: 201 mg/dL — AB (ref 70–99)
Glucose-Capillary: 170 mg/dL — ABNORMAL HIGH (ref 70–99)
Glucose-Capillary: 189 mg/dL — ABNORMAL HIGH (ref 70–99)
Glucose-Capillary: 191 mg/dL — ABNORMAL HIGH (ref 70–99)
Glucose-Capillary: 201 mg/dL — ABNORMAL HIGH (ref 70–99)
Glucose-Capillary: 253 mg/dL — ABNORMAL HIGH (ref 70–99)

## 2014-06-15 LAB — URINE MICROSCOPIC-ADD ON

## 2014-06-15 LAB — URINE CULTURE
COLONY COUNT: NO GROWTH
Culture: NO GROWTH

## 2014-06-15 NOTE — Progress Notes (Signed)
   Subjective:  Patient reports pain as moderate.  No events.  Objective:   VITALS:   Filed Vitals:   06/15/14 0352 06/15/14 0400 06/15/14 0500 06/15/14 0750  BP: 119/59     Pulse: 84     Temp: 99.9 F (37.7 C)     TempSrc: Oral     Resp: 20 20  18   Height:      Weight:   91.3 kg (201 lb 4.5 oz)   SpO2: 98% 99%  99%    Neurologically intact Neurovascular intact Sensation intact distally Intact pulses distally Dorsiflexion/Plantar flexion intact Incision: dressing C/D/I and no drainage No cellulitis present Compartment soft   Lab Results  Component Value Date   WBC 8.9 06/15/2014   HGB 8.3* 06/15/2014   HCT 25.5* 06/15/2014   MCV 73.1* 06/15/2014   PLT 95* 06/15/2014     Assessment/Plan:  1 Day Post-Op   - Expected postop acute blood loss anemia - will monitor for symptoms - Up with PT/OT - DVT ppx - SCDs, ambulation, lovenox renally dosed - WBAT left lower extremity - Pain control - Discharge planning per hospitalist  Cheral AlmasXu, Emory Leaver Michael 06/15/2014, 8:43 AM [phone]

## 2014-06-15 NOTE — Progress Notes (Signed)
Pt's foley was d/c'd at 9AM, but pat has not yet voided. Bladder scan shows 271 ml. MD notified. Orders are to continue monitoring output for now unless pt becomes uncomfortable.   Also, pt's fever returned (temp 101). Tylenol given and MD notified.

## 2014-06-15 NOTE — Progress Notes (Signed)
Subjective: Denies any complaints, states pain is well controlled. Wife at bedside states they can arrange for someone to be home w/ pt 24hrs. Will have SW discuss rehab options with patient  Objective: Vital signs in last 24 hours: Filed Vitals:   06/15/14 0352 06/15/14 0400 06/15/14 0500 06/15/14 0750  BP: 119/59     Pulse: 84     Temp: 99.9 F (37.7 C)     TempSrc: Oral     Resp: Height:      Weight:   201 lb 4.5 oz (91.3 kg)   SpO2: 98% 99%  99%   Weight change: -8 lb 11.5 oz (-3.955 kg)  Intake/Output Summary (Last 24 hours) at 06/15/14 0840 Last data filed at 06/15/14 0731  Gross per 24 hour  Intake 4139.58 ml  Output   2215 ml  Net 1924.58 ml   General: NAD, laying in bed comfortably Lungs: clear to anterior auscultation Cardiac: RRR, no murmurs GI: soft, active bowel sounds, distended, non TTP Neuro: CN II-XII grossly intact   Lab Results: Basic Metabolic Panel:  Recent Labs Lab 06/13/14 2210 06/14/14 0440  NA 140 144  K 6.5* 5.0  CL 113* 117*  CO2 17* 22  GLUCOSE 170* 160*  BUN 57* 55*  CREATININE 2.37* 2.16*  CALCIUM 8.9 8.7   Liver Function Tests:  Recent Labs Lab 06/13/14 2210 06/14/14 0420  AST 66* 60*  ALT 55* 52  ALKPHOS 121* 118*  BILITOT 0.7 0.9  PROT 7.8 6.9  ALBUMIN 3.2* 2.9*   CBC:  Recent Labs Lab 06/13/14 2210 06/14/14 0420 06/15/14 0708  WBC 7.3 7.3 8.9  NEUTROABS 4.8  --  PENDING  HGB 10.6* 10.3* 8.3*  HCT 32.8* 31.8* 25.5*  MCV 72.9* 72.6* 73.1*  PLT 122* 114* 95*   CBG:  Recent Labs Lab 06/14/14 1427 06/14/14 1653 06/14/14 2007 06/14/14 2353 06/15/14 0349 06/15/14 0816  GLUCAP 117* 153* 258* 191* 189* 201*   Coagulation:  Recent Labs Lab 06/13/14 2210  LABPROT 14.9  INR 1.16   Urinalysis:  Recent Labs Lab 06/14/14 0127  COLORURINE YELLOW  LABSPEC 1.013  PHURINE 5.0  GLUCOSEU NEGATIVE  HGBUR NEGATIVE  BILIRUBINUR NEGATIVE  KETONESUR NEGATIVE  PROTEINUR 100*    UROBILINOGEN 0.2  NITRITE NEGATIVE  LEUKOCYTESUR NEGATIVE   Studies/Results: Dg Chest 1 View  06/13/2014   CLINICAL DATA:  Fall.  Left hip pain.  Left femoral neck fracture.  EXAM: CHEST  1 VIEW  COMPARISON:  05/24/2013  FINDINGS: Prominent right upper paratracheal stripe and mild prominence of soft tissue density above the aortic arch, both worsened from 05/24/2013. Mediastinal width in this vicinity is 9.7 cm. Coronary atherosclerotic calcification is visible. Borderline cardiomegaly. No edema.  IMPRESSION: 1. Increased prominence of upper mediastinal soft tissues. I cannot exclude adenopathy or mass given the change from prior, although some of the appearance is probably attributable to the AP supine technique. Given that an PA upright view of the chest may be problematic due to the patient's hip fracture currently, chest CT should be considered. 2. Borderline cardiomegaly.   Electronically Signed   By: Gaylyn Rong M.D.   On: 06/13/2014 22:22   Pelvis Portable  06/14/2014   CLINICAL DATA:  Status post left hip arthroplasty.  EXAM: PORTABLE PELVIS 1-2 VIEWS  COMPARISON:  None.  FINDINGS: Single AP image shows the left hip prosthesis to be well-seated and aligned. No acute fracture or evidence of an operative complication.  IMPRESSION:  Well aligned left hip prosthesis.   Electronically Signed   By: Amie Portlandavid  Ormond M.D.   On: 06/14/2014 17:32   Dg Hip Unilat With Pelvis 2-3 Views Left  06/13/2014   CLINICAL DATA:  Fall tonight with left hip pain  EXAM: LEFT HIP (WITH PELVIS) 2-3 VIEWS  COMPARISON:  None.  FINDINGS: There is a mildly displaced acute fracture of the left femoral neck, mainly transcervical. The left femoral head remains located.  No evidence of pelvic ring fracture or diastasis.  Osteopenia.  The right hip is negative.  IMPRESSION: Mildly displaced left femoral neck fracture.   Electronically Signed   By: Marnee SpringJonathon  Watts M.D.   On: 06/13/2014 21:59   Medications: I have reviewed  the patient's current medications. Scheduled Meds: . amLODipine  10 mg Oral q morning - 10a  . carvedilol  12.5 mg Oral BID WC  . docusate sodium  100 mg Oral BID  . enoxaparin (LOVENOX) injection  30 mg Subcutaneous Q24H  . insulin aspart  0-15 Units Subcutaneous 6 times per day  . sodium chloride  3 mL Intravenous Q12H  . terazosin  10 mg Oral QHS   Continuous Infusions: . sodium chloride 125 mL/hr at 06/15/14 0531  . sodium chloride    . sodium chloride     PRN Meds:.acetaminophen **OR** acetaminophen, alum & mag hydroxide-simeth, HYDROcodone-acetaminophen, HYDROmorphone (DILAUDID) injection, hydroxypropyl methylcellulose / hypromellose, menthol-cetylpyridinium **OR** phenol, methocarbamol **OR** methocarbamol (ROBAXIN)  IV, metoCLOPramide **OR** metoCLOPramide (REGLAN) injection, morphine injection, ondansetron **OR** ondansetron (ZOFRAN) IV, oxyCODONE, promethazine Assessment/Plan: Principal Problem:   Closed displaced fracture of left femoral neck Active Problems:   Anemia of chronic renal failure, stage 3 (moderate)   Essential hypertension   Type 2 diabetes mellitus with peripheral artery disease   Type 2 diabetes mellitus with stage 3 chronic kidney disease   AKI (acute kidney injury)   Left displaced femoral neck fracture   Displaced left femoral neck fracture: day 1 s/p left femoral prosthetic replacement, pain is well controlled and pt tolerating diet. Working with PT today - Dilaudid, oxy, morphine, norco, and fentanyl prn for pain control - Colace 100 mg twice daily   Type 2 diabetes complicated by retinopathy, peripheral vascular disease:  -moderate sliding scale insulin  Chronic kidney disease stage III: Creatinine 2.4 on admission, baseline 1.5-1.9. Creatinine 2.16 this morning.  - will continue to monitor - holding ACEinh 2/2 low BPs, can restart as needed  Abnormal LFTs: Alkaline phosphatase 121, AST 66, ALT 55 on admission. Likely in the setting of his acute  illness. PT/INR reassuring. -LFTs trending down, will continue to monitor  Hypertension: Home medications include amlodipine 10 mg, Coreg 12.5 mg twice daily, terazosin 10 mg at bedtime, lisinopril 10 mg. -Continue Coreg, amlodipine, terazosin  - holding ACEinh as BP low normal, can restart as needed  Coronary artery disease: hold aspirin 81 mg   Benign prostatic hypertrophy with nocturia: Terazosin as noted above.  #FEN:  - carb mod diet  #DVT prophylaxis: lovenox renally dose, SCDs  #CODE STATUS: FULL CODE -Defer to Drinda ButtsElizabeth Williams [[phone]] if patients lacks decision-making capacity -Confirmed with patient on admission  Dispo: Disposition is deferred at this time, awaiting improvement of current medical problems.  The patient does have a current PCP Doneen Poisson(Lawrence Klima, MD) and [facility] after discharge.  The patient does have transportation limitations that hinder transportation to clinic appointments.  .Services Needed at time of discharge: Y = Yes, Blank = No PT:  OT:   RN:   Equipment:   Other:     LOS: 1 day   Denton Brick, MD 06/15/2014, 8:40 AM

## 2014-06-15 NOTE — Evaluation (Signed)
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  Pt is in some significant pain but main issue is weakness to move.  Planning to do afternoon visit with nursing to slide back to bed and assess his tolerance for activity again.  SNF planned and pt and wife agree.    Follow Up Recommendations SNF;Supervision/Assistance - 24 hour    Equipment Recommendations  None recommended by PT (await disposition from SNF)    Recommendations for Other Services       Precautions / Restrictions Precautions Precautions: None;Fall Restrictions Weight Bearing Restrictions: Yes Other Position/Activity Restrictions: WBAT      Mobility  Bed Mobility Overal bed mobility: Needs Assistance;+2 for physical assistance;+ 2 for safety/equipment Bed Mobility: Supine to Sit;Rolling Rolling: Mod assist;Max assist   Supine to sit: Max assist;+2 for physical assistance;+2 for safety/equipment;HOB elevated     General bed mobility comments: Pain and limits of R AK surgery create difficulty to sit up OOB  Transfers Overall transfer level: Needs assistance Equipment used: Rolling walker (2 wheeled);2 person hand held assist (R prosthesis) Transfers: Sit to/from Stand Sit to Stand: Total assist         General transfer comment: pain and limits of RLE prosthesis make transfers very dependent., slid over with 2 person assist to chair  Ambulation/Gait             General Gait Details: unable  Stairs            Wheelchair Mobility    Modified Rankin (Stroke Patients Only)       Balance Overall balance assessment: Needs assistance Sitting-balance support: Feet  supported Sitting balance-Leahy Scale: Fair   Postural control: Posterior lean Standing balance support:  (unable to fully stand)                                 Pertinent Vitals/Pain Pain Assessment: 0-10 Pain Score: 9  Pain Location: L hip Pain Intervention(s): Limited activity within patient's tolerance;Premedicated before session;Monitored during session;Repositioned;Ice applied    Home Living Family/patient expects to be discharged to:: Private residence Living Arrangements: Spouse/significant other Available Help at Discharge: Family Type of Home: House Home Access: Ramped entrance     Home Layout: Two level;Able to live on main level with bedroom/bathroom Home Equipment: Dan Humphreys - 2 wheels;Shower seat;Wheelchair - power;Bedside commode      Prior Function Level of Independence: Independent with assistive device(s)               Hand Dominance        Extremity/Trunk Assessment   Upper Extremity Assessment: Overall WFL for tasks assessed           Lower Extremity Assessment: Generalized weakness      Cervical / Trunk Assessment: Normal  Communication   Communication: No difficulties  Cognition Arousal/Alertness: Awake/alert Behavior During Therapy: WFL for tasks assessed/performed Overall Cognitive Status: Within Functional Limits for tasks assessed                      General Comments General comments (skin integrity, edema, etc.): Pt is having some  significant pain but is weak from limits developing prior to fall, including LLE numbness per pt.    Exercises        Assessment/Plan    PT Assessment Patient needs continued PT services  PT Diagnosis Generalized weakness;Difficulty walking;Acute pain   PT Problem List Decreased strength;Decreased range of motion;Decreased activity tolerance;Decreased balance;Decreased mobility;Decreased coordination;Decreased knowledge of precautions;Cardiopulmonary status limiting  activity;Obesity;Decreased skin integrity;Pain  PT Treatment Interventions DME instruction;Gait training;Stair training;Functional mobility training;Therapeutic activities;Therapeutic exercise;Balance training;Neuromuscular re-education;Patient/family education   PT Goals (Current goals can be found in the Care Plan section) Acute Rehab PT Goals Patient Stated Goal: to go home when able PT Goal Formulation: With patient/family Time For Goal Achievement: 06/29/14 Potential to Achieve Goals: Good    Frequency Min 3X/week   Barriers to discharge Inaccessible home environment;Decreased caregiver support wife cannot lift up pt    Co-evaluation               End of Session Equipment Utilized During Treatment: Oxygen Activity Tolerance: Patient tolerated treatment well;Patient limited by pain;Patient limited by fatigue Patient left: in chair;with call bell/phone within reach;with nursing/sitter in room;with family/visitor present Nurse Communication: Mobility status;Other (comment) (PT has BID visit planned and reminded nursing to call PT )         Time: [id number] PT Time Calculation (min) (ACUTE ONLY): 40 min   Charges:   PT Evaluation $Initial PT Evaluation Tier I: 1 Procedure PT Treatments $Therapeutic Activity: 23-37 mins   PT G Codes:        Ivar DrapeStout, Ezinne Yogi E 06/15/2014, 12:07 PM   Samul Dadauth Abygail Galeno, PT MS Acute Rehab Dept. Number: [phone]

## 2014-06-15 NOTE — Clinical Social Work Psychosocial (Signed)
Clinical Social Work Department BRIEF PSYCHOSOCIAL ASSESSMENT 06/15/2014  Patient:  [name] [name],[name] [name]     Account Number:  [id number]     Admit date:  06/13/2014  Clinical Social Worker:  Stacy GardnerAVENPORT,Marleny Faller, CLINICAL SOCIAL WORKER  Date/Time:  06/15/2014 04:02 PM  Referred by:  Physician  Date Referred:  06/15/2014 Referred for  SNF Placement   Other Referral:   N/A   Interview type:  Patient Other interview type:   Patient's wife was also in room.    PSYCHOSOCIAL DATA Living Status:  WIFE Admitted from facility:   Level of care:   Primary support name:  [name] Primary support relationship to patient:  SPOUSE Degree of support available:   Support is strong.    CURRENT CONCERNS Current Concerns  Post-Acute Placement   Other Concerns:   N/A    SOCIAL WORK ASSESSMENT / PLAN BSW intern spoke with patient about his plans for discharge. Patient and patient's wife were both very easy going and made it clear their main concern was for him to get better. Patient currently lives at home with his wife but believes his best option is to go to a SNF temporarily until he can get back on his feet. BSW intern explained to patient the possibility [place] out of county due to insurance complications and they were both very understanding.   Assessment/plan status:  Psychosocial Support/Ongoing Assessment of Needs Other assessment/ plan:   N/A   Information/referral to community resources:   CSW contact information and SNF list given to patient's wife.    PATIENT'S/FAMILY'S RESPONSE TO PLAN OF CARE: Patient and patient's wife were both content about the possibility of SNF. Once stable patient will likely discharge to SNF. Ongoing assessment needed.      Stacy GardnerErin Dynesha Woolen, BSW Intern, [phone]

## 2014-06-15 NOTE — Progress Notes (Signed)
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  He is having some stiffness between visits from PT and will benefit from recovering his independence for both types of movement.  SNF still recommended.  Follow Up Recommendations  SNF;Supervision/Assistance - 24 hour     Equipment Recommendations  None recommended by PT    Recommendations for Other Services       Precautions / Restrictions Precautions Precautions: Fall Restrictions Weight Bearing Restrictions: Yes Other Position/Activity Restrictions: WBAT    Mobility  Bed Mobility Overal bed mobility: Needs Assistance Bed Mobility: Sit to Supine;Rolling Rolling: Mod assist;Max assist     Sit to supine: Max assist;+2 for physical assistance   General bed mobility comments: bed was level and pt able to assist until he is on his back, where he struggles due to pain  Transfers Overall transfer level: Needs assistance Equipment used: 2 person hand held assist (bed pad to slide as pt uses UE's to help scoot) Transfers: Lateral/Scoot Transfers Sit to Stand: Total assist        Lateral/Scoot Transfers: Mod assist General transfer comment: slid to bed with pad and pt using UE's with prosthesis removed.  Tried standign lift as nursing wanted to but was unsuccessful  Ambulation/Gait             General Gait Details: unable   Careers information officertairs            Wheelchair Mobility    Modified Rankin (Stroke Patients Only)       Balance Overall balance assessment: Needs assistance Sitting-balance support: Single extremity supported Sitting balance-Leahy Scale: Fair                               Cognition Arousal/Alertness: Awake/alert Behavior During Therapy: WFL for tasks assessed/performed Overall Cognitive Status: Within Functional Limits for tasks assessed                      Exercises      General Comments General comments (skin integrity, edema, etc.): removed leg RLE due to difficulty in standing and pt is being very creative to find a safe transition technique.      Pertinent Vitals/Pain Pain Assessment: 0-10 Pain Score: 6  Pain Location: L hip Pain Intervention(s): Limited activity within patient's tolerance;Monitored during session;Premedicated before session;Repositioned    Home Living                      Prior Function            PT Goals (current goals can now be found in the care plan section) Acute Rehab PT Goals Patient Stated Goal: to go home when able Progress towards PT goals: Progressing toward goals    Frequency  Min 3X/week    PT Plan Current plan remains appropriate    Co-evaluation             End of Session Equipment Utilized During Treatment: Oxygen Activity Tolerance: Patient tolerated treatment well;Patient limited by pain;Patient limited by fatigue Patient left: in bed;with call bell/phone within reach;with  bed alarm set;with family/visitor present;with nursing/sitter in room     Time: 1346-1415 PT Time Calculation (min) (ACUTE ONLY): 29 min  Charges:  $Therapeutic Activity: 23-37 mins                    G Codes:      Ivar Drape 07-09-2014, 3:43 PM   Samul Dada, PT MS Acute Rehab Dept. Number: [phone]

## 2014-06-15 NOTE — Evaluation (Addendum)
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 He will benefit from follow up OT at SNF, we will D/C from acute OT.    Follow Up Recommendations  SNF    Equipment Recommendations   (TBD at next venue)       Precautions / Restrictions Precautions Precautions: Fall Precaution Comments: right AKA Restrictions:  Weight Bearing Restrictions: Yes Other Position/Activity Restrictions: WBAT      Mobility Bed Mobility Overal bed mobility: Needs Assistance Bed Mobility: Supine to Sit;Sit to Supine Rolling: Mod assist;Max assist   Supine to sit: Max assist;+2 for physical assistance;HOB elevated Sit to supine:  (Upon going supine to sit, pt developed a spasm in his LLE causing it to turn out and knee to bend)       Balance Overall balance assessment: Needs assistance Sitting-balance support: No upper extremity supported (LLE supported) Sitting balance-Leahy Scale: Fair                                      ADL Overall ADL's : Needs assistance/impaired Eating/Feeding: Independent;Sitting   Grooming: Set up;Sitting   Upper Body Bathing: Set up;Sitting   Lower Body Bathing: Maximal assistance;Sitting/lateral leans   Upper Body Dressing : Set up;Sitting   Lower Body Dressing: Total assistance;Sitting/lateral leans                       Vision Additional Comments: No change from baseline          Pertinent Vitals/Pain Pain Assessment: 0-10 Pain Score: 7  Pain Location: left  hip post laying down Pain Descriptors / Indicators: Spasm;Aching Pain Intervention(s): Monitored during session;Repositioned;Patient requesting pain meds-RN notified     Hand Dominance  Right   Extremity/Trunk Assessment Upper Extremity Assessment Upper Extremity Assessment: Overall WFL for tasks assessed              Cognition Arousal/Alertness: Awake/alert Behavior During Therapy: WFL for tasks assessed/performed Overall Cognitive Status: Within Functional Limits for tasks assessed                                Home Living Family/patient expects to be discharged to:: Skilled nursing facility                                             OT Diagnosis: Generalized weakness;Acute pain   OT Problem List: Decreased strength;Decreased range of motion;Impaired balance (sitting and/or standing);Pain;Obesity;Decreased knowledge of use of DME or AE      OT Goals(Current goals can be found in the care plan section) Acute Rehab OT Goals Patient Stated Goal: to rehab then home  OT Frequency:                End of Session Nurse Communication:  Patient requests pain meds (IV finished)  Activity Tolerance: Patient limited by pain Patient left: in bed;with call bell/phone within reach;with family/visitor present   Time: [id number] OT Time Calculation (min): 27 min Charges:  OT General Charges $OT Visit: 1 Procedure OT Evaluation $Initial OT Evaluation Tier I: 1 Procedure OT Treatments $Self Care/Home Management : 8-22 mins    Evette GeorgesLeonard, Jobeth Pangilinan Eva [phone] 06/15/2014, 4:14 PM

## 2014-06-15 NOTE — Progress Notes (Signed)
MD notified due to acute change in patient status. Vital signs and labs are listed below.  MD notified(1st page) Time of 1st page:  1000 Responding MD:  Dr Jamal Maesiana Troung Time MD responded: 1000 MD response: MD stated aware  Vital Signs Filed Vitals:   06/15/14 0750 06/15/14 0912 06/15/14 1121 06/15/14 1201  BP:  128/70  103/61  Pulse:  83  80  Temp:  101.8 F (38.8 C)  99.6 F (37.6 C)  TempSrc:  Oral  Oral  Resp: 18 18 18 25   Height:      Weight:      SpO2: 99% 96% 96% 97%     Lab Results WBC  Date/Time Value Ref Range Status  06/15/2014 07:08 AM 8.9 4.0 - 10.5 K/uL Final  06/14/2014 04:20 AM 7.3 4.0 - 10.5 K/uL Final  06/13/2014 10:10 PM 7.3 4.0 - 10.5 K/uL Final   NEUTROPHILS RELATIVE %  Date/Time Value Ref Range Status  06/15/2014 07:08 AM 72 43 - 77 % Final  06/13/2014 10:10 PM 66 43 - 77 % Final  04/20/2012 12:38 PM 77 43 - 77 % Final   No results found for: PCO2ART LACTIC ACID, VENOUS  Date/Time Value Ref Range Status  04/20/2012 03:19 PM 0.8 0.5 - 2.2 mmol/L Final   No results found for: PCO2VEN   Duanne LimerickWesselink, [name], RN 06/15/2014, 12:51 PM

## 2014-06-15 NOTE — Progress Notes (Signed)
  PROGRESS NOTE MEDICINE TEACHING ATTENDING   Day 1 of stay Patient name: [name] [name]   Medical record number: [id number] Date of birth: 10/16/1945   Patient complaints of nausea and feeling hot. Reports less pain than yesterday. Not voided urine on his own since surgery.  Tmax post surgery 101.70F measured at 9am today.  Blood pressure 128/70, pulse 83, temperature 101.8 F (38.8 C), temperature source Oral,  Resp. rate 18, height 5\' 8"  (1.727 m), weight 201 lb 4.5 oz (91.3 kg), SpO2 96 %.  Lying in bed, no distress, regular cardiac rate and rhythm, lungs auscultated anteriorly - clear, abdomen benign, BS+, alert and oriented.    Assessment  Early post op fever likely reactive due to surgical inflammatory response. No signs of acute infection. Continue to monitor.   Work with PT today as tolerated.   Creatinine trending down since admission. 2.19 today  Diabetes well controlled.  I have discussed the care of this patient with my IM team residents. Please see the resident note for details.  Herby Amick 06/15/2014, 10:53 AM.

## 2014-06-15 NOTE — Clinical Social Work Placement (Signed)
Clinical Social Work Department CLINICAL SOCIAL WORK PLACEMENT NOTE 06/15/2014  Patient:  Derrek GuORWOOD,Acelin R  Account Number:  [id number] Admit date:  06/13/2014  Clinical Social Worker:  Stacy GardnerERIN Markelle Najarian, CLINICAL SOCIAL WORKER  Date/time:  06/15/2014 03:37 PM  Clinical Social Work is seeking post-discharge placement for this patient at the following level of care:   SKILLED NURSING   (*CSW will update this form in Epic as items are completed)   06/15/2014  Patient/family provided with [facility] of Clinical Social Work's list of facilities offering this level of care within the geographic area requested by the patient (or if unable, by the patient's family).  06/15/2014  Patient/family informed of their freedom to choose among providers that offer the needed level of care, that participate in Medicare, Medicaid or managed care program needed by the patient, have an available bed and are willing to accept the patient.  06/15/2014  Patient/family informed of [facility]' ownership interest in [facility], as well as of the fact that they are under no obligation to receive care at this facility.  PASARR submitted to EDS on 06/15/2014 PASARR number received on 06/15/2014  FL2 transmitted to all facilities in geographic area requested by pt/family on  06/15/2014 FL2 transmitted to all facilities within larger geographic area on   Patient informed that his/her managed care company has contracts with or will negotiate with  certain facilities, including the following:     Patient/family informed of bed offers received:   Patient chooses bed at  Physician recommends and patient chooses bed at    Patient to be transferred to  on   Patient to be transferred to facility by  Patient and family notified of transfer on  Name of family member notified:    The following physician request were entered in Epic:   Additional Comments:   Stacy GardnerErin Darcy Cordner, BSW Intern,  [phone]

## 2014-06-15 NOTE — Progress Notes (Signed)
Inpatient Diabetes Program Recommendations  AACE/ADA: New Consensus Statement on Inpatient Glycemic Control (2013)  Target Ranges:  Prepandial:   less than 140 mg/dL      Peak postprandial:   less than 180 mg/dL (1-2 hours)      Critically ill patients:  140 - 180 mg/dL   Results for Derrek GuORWOOD, Marquel R (MRN [id number]) as of 06/15/2014 11:54  Ref. Range 06/14/2014 16:53 06/14/2014 20:07 06/14/2014 23:53 06/15/2014 03:49 06/15/2014 08:16  Glucose-Capillary Latest Range: 70-99 mg/dL 409153 (H) 811258 (H) 914191 (H) 189 (H) 201 (H)    Reason for assessment: elevated CBG  Diabetes history: Type 2 Outpatient Diabetes medications: Novolog 70/30 insulin 30 units bid Current orders for Inpatient glycemic control: Novolog correction 0-15 units q4h  Please consider starting Lantus insulin since fasting blood sugar was 201mg /dl this am. He currently receives 42 units of long acting insulin in Novolog 70/30,  30 units bid in a 24 hr period- may want to just start with Lantus 20 units qday and increase as needed.  Susette RacerJulie Quintel Mccalla, RN, BA, MHA, CDE Diabetes Coordinator Inpatient Diabetes Program  [phone] (Team Pager) [phone] Patrcia Dolly([place] Office) 06/15/2014 11:59 AM

## 2014-06-15 NOTE — Care Management Note (Unsigned)
    Page 1 of 1   06/15/2014     4:49:51 PM CARE MANAGEMENT NOTE 06/15/2014  Patient:  [name] [name],[name] [name]   Account Number:  [id number]  Date Initiated:  06/14/2014  Documentation initiated by:  [facility]  Subjective/Objective Assessment:   femur fx, scheduled surgery 06/14/2014     Action/Plan:   pt eval- snf   Anticipated DC Date:  06/16/2014   Anticipated DC Plan:  SKILLED NURSING FACILITY  In-house referral  Clinical Social Worker      DC Planning Services  CM consult      Choice offered to / List presented to:             Status of service:  In process, will continue to follow Medicare Important Message given?  NA - LOS <3 / Initial given by admissions (If response is "NO", the following Medicare IM given date fields will be blank) Date Medicare IM given:   Medicare IM given by:   Date Additional Medicare IM given:   Additional Medicare IM given by:    Discharge Disposition:    Per UR Regulation:  Reviewed for med. necessity/level of care/duration of stay  If discussed at Long Length of Stay Meetings, dates discussed:    Comments:  06/14/2014 1000 NCM reviewed chart. UR completed. [name] DonningAlesia Shavis RN CCM Case Mgmt phone [phone]

## 2014-06-15 NOTE — Anesthesia Postprocedure Evaluation (Signed)
  Anesthesia Post-op Note  Patient: [name] [name]  Procedure(s) Performed: Procedure(s): HEMI HIP ARTHROPLASTY ANTERIOR APPROACH (Left)  Patient Location: PACU  Anesthesia Type: General   Level of Consciousness: awake, alert  and oriented  Airway and Oxygen Therapy: Patient Spontanous Breathing  Post-op Pain: none  Post-op Assessment: Post-op Vital signs reviewed  Post-op Vital Signs: Reviewed  Last Vitals:  Filed Vitals:   06/15/14 1201  BP: 103/61  Pulse: 80  Temp: 37.6 C  Resp: 25    Complications: No apparent anesthesia complications

## 2014-06-16 ENCOUNTER — Inpatient Hospital Stay (HOSPITAL_COMMUNITY): Payer: Medicare PPO

## 2014-06-16 DIAGNOSIS — I129 Hypertensive chronic kidney disease with stage 1 through stage 4 chronic kidney disease, or unspecified chronic kidney disease: Secondary | ICD-10-CM

## 2014-06-16 DIAGNOSIS — W19XXXA Unspecified fall, initial encounter: Secondary | ICD-10-CM | POA: Insufficient documentation

## 2014-06-16 DIAGNOSIS — E1139 Type 2 diabetes mellitus with other diabetic ophthalmic complication: Secondary | ICD-10-CM

## 2014-06-16 DIAGNOSIS — R748 Abnormal levels of other serum enzymes: Secondary | ICD-10-CM

## 2014-06-16 DIAGNOSIS — R509 Fever, unspecified: Secondary | ICD-10-CM | POA: Insufficient documentation

## 2014-06-16 LAB — CBC
HCT: 24.8 % — ABNORMAL LOW (ref 39.0–52.0)
Hemoglobin: 7.9 g/dL — ABNORMAL LOW (ref 13.0–17.0)
MCH: 23.2 pg — ABNORMAL LOW (ref 26.0–34.0)
MCHC: 31.9 g/dL (ref 30.0–36.0)
MCV: 72.7 fL — AB (ref 78.0–100.0)
Platelets: 107 10*3/uL — ABNORMAL LOW (ref 150–400)
RBC: 3.41 MIL/uL — ABNORMAL LOW (ref 4.22–5.81)
RDW: 14.4 % (ref 11.5–15.5)
WBC: 10.5 10*3/uL (ref 4.0–10.5)

## 2014-06-16 LAB — GLUCOSE, CAPILLARY
GLUCOSE-CAPILLARY: 117 mg/dL — AB (ref 70–99)
Glucose-Capillary: 110 mg/dL — ABNORMAL HIGH (ref 70–99)
Glucose-Capillary: 152 mg/dL — ABNORMAL HIGH (ref 70–99)
Glucose-Capillary: 154 mg/dL — ABNORMAL HIGH (ref 70–99)
Glucose-Capillary: 157 mg/dL — ABNORMAL HIGH (ref 70–99)
Glucose-Capillary: 192 mg/dL — ABNORMAL HIGH (ref 70–99)

## 2014-06-16 LAB — BASIC METABOLIC PANEL
ANION GAP: 7 (ref 5–15)
BUN: 55 mg/dL — AB (ref 6–23)
CO2: 19 mmol/L (ref 19–32)
CREATININE: 2.37 mg/dL — AB (ref 0.50–1.35)
Calcium: 7 mg/dL — ABNORMAL LOW (ref 8.4–10.5)
Chloride: 113 mmol/L — ABNORMAL HIGH (ref 96–112)
GFR, EST AFRICAN AMERICAN: 30 mL/min — AB (ref >90)
GFR, EST NON AFRICAN AMERICAN: 26 mL/min — AB (ref >90)
Glucose, Bld: 145 mg/dL — ABNORMAL HIGH (ref 70–99)
Potassium: 4.2 mmol/L (ref 3.5–5.1)
Sodium: 139 mmol/L (ref 135–145)

## 2014-06-16 MED ORDER — ASPIRIN EC 81 MG PO TBEC
81.0000 mg | DELAYED_RELEASE_TABLET | Freq: Every morning | ORAL | Status: DC
  Administered 2014-06-16 – 2014-06-18 (×3): 81 mg via ORAL
  Filled 2014-06-16 (×3): qty 1

## 2014-06-16 MED ORDER — INSULIN ASPART 100 UNIT/ML ~~LOC~~ SOLN
0.0000 [IU] | Freq: Three times a day (TID) | SUBCUTANEOUS | Status: DC
  Administered 2014-06-16 – 2014-06-17 (×6): 3 [IU] via SUBCUTANEOUS
  Administered 2014-06-18: 2 [IU] via SUBCUTANEOUS
  Administered 2014-06-18: 3 [IU] via SUBCUTANEOUS

## 2014-06-16 MED ORDER — DOCUSATE SODIUM 100 MG PO CAPS
200.0000 mg | ORAL_CAPSULE | Freq: Two times a day (BID) | ORAL | Status: DC
  Administered 2014-06-16 (×2): 200 mg via ORAL
  Filled 2014-06-16 (×3): qty 2

## 2014-06-16 NOTE — Clinical Social Work Note (Signed)
Bed offers given to patient this afternoon. [name] [name] has offered a bed with LOG pending auth if ready over the weekend. Family aware that patient WILL DC to a LOG facility [name]) if ready before auth received. Report left for weekend CSW.   [name] [name] MSW, [place] Gardens[place]CSWA, [place], [phone]

## 2014-06-16 NOTE — Progress Notes (Signed)
Pt refused to go down to xray at this time because he is tired and wants to go to sleep. The transporter will be back around 0730 to pick him up.

## 2014-06-16 NOTE — Progress Notes (Signed)
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  Follow Up Recommendations  SNF;Supervision/Assistance - 24 hour     Equipment Recommendations  None recommended by PT    Recommendations for Other Services       Precautions / Restrictions Precautions Precautions: None Precaution Comments: right AKA Restrictions Other Position/Activity Restrictions: WBAT    Mobility  Bed Mobility Overal bed mobility: Needs Assistance Bed Mobility: Supine to Sit;Sit to Supine     Supine to sit: Max assist;+2 for physical assistance;HOB elevated     General bed mobility comments: assisted trunk up to EOB and helped with w/shift and asymmetrical scoot to EOB  Transfers Overall transfer level: Needs assistance Equipment used: 2 person hand held assist;Rolling walker (2 wheeled) Transfers: Sit to/from [employer] CorporationStand;Stand Pivot Transfers Sit to Stand: +2 physical assistance;Max assist Stand pivot transfers: Max assist;+2 physical assistance          Ambulation/Gait             General Gait Details: unable see transfer   Stairs            Wheelchair Mobility    Modified Rankin (Stroke Patients Only)       Balance Overall balance assessment: No apparent balance deficits (not formally assessed)   Sitting balance-Leahy Scale: Fair     Standing balance support: Bilateral upper extremity supported Standing balance-Leahy Scale: Poor                      Cognition Arousal/Alertness: Awake/alert Behavior During Therapy: WFL for tasks assessed/performed Overall Cognitive Status: Within Functional Limits for tasks assessed                       Exercises General Exercises - Lower Extremity Ankle Circles/Pumps: AROM;Both;20 reps;Seated Quad Sets: AAROM;Both;10 reps;Supine Heel Slides: AAROM;Left;10 reps;Supine (graded resistance)    General Comments        Pertinent Vitals/Pain Pain Assessment: Faces Faces Pain Scale: Hurts whole lot Pain Location: left hip Pain Intervention(s): Monitored during session    Home Living                      Prior Function            PT Goals (current goals can now be found in the care plan section) Acute Rehab PT Goals Patient Stated Goal: to rehab then home PT Goal Formulation: With patient/family Time For Goal Achievement: 06/29/14 Potential to Achieve Goals: Good Progress towards PT goals: Progressing toward goals    Frequency  Min 3X/week    PT Plan Current plan remains appropriate    Co-evaluation             End of Session   Activity Tolerance: Patient limited by pain;Patient tolerated treatment well Patient left: with family/visitor present;in chair;with chair alarm set     Time: [id number] PT Time Calculation (min) (ACUTE ONLY): 30 min  Charges:  $Therapeutic Exercise: 8-22 mins $Therapeutic Activity: 8-22 mins                    G Codes:  Shelsie Tijerino, Eliseo Gum 06/16/2014, 4:47 PM 06/16/2014  [place] Bing, PT [phone]  (pager)

## 2014-06-16 NOTE — Progress Notes (Signed)
Internal Medicine Attending  Date: 06/16/2014  Patient name: [name] [name] Medical record number: [id number] Date of birth: 09/20/1945 Age: 69 y.o. Gender: male  I saw and evaluated the patient. I reviewed the resident's note by Dr. Danella Pentonruong and I agree with the resident's findings and plans as documented in her progress note.  [name] [name] felt well when seen on rounds this morning with the exception of continued left hip pain when he moves.  He spiked a fever but denies cough, SOB, or dysuria.  Examination reveals clear lungs anteriorly and a clear dressing over the surgical incision.  CXR this AM is notable for a large amount of gas in the stomach but no pulmonary infiltrates.  Agree with pain control, continued physical therapy, SNF placement for further rehab on discharge, and continued clinical monitoring and follow-up of cultures given recent fever.  Antibiotics are not indicated as of yet.

## 2014-06-16 NOTE — Progress Notes (Signed)
   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 fell with new THA LLE with anterior approach, WBAT  PT Time Calculation  PT Start Time (ACUTE ONLY) 1722  PT Stop Time (ACUTE ONLY) 1741  PT Time Calculation (min) (ACUTE ONLY) 19 min  Subjective Data  Patient Stated Goal to rehab then home  Precautions  Precautions None  Precaution Comments right AKA  Restrictions  Other Position/Activity Restrictions WBAT  Pain Assessment  Faces Pain Scale 8  Pain Location left hip  Pain Descriptors / Indicators Aching  Cognition  Arousal/Alertness Awake/alert  Behavior During Therapy WFL for tasks assessed/performed  Overall Cognitive Status Within Functional Limits for tasks assessed  Bed Mobility  Overal bed mobility Needs Assistance  Bed Mobility Sit to Supine  Sit to supine Max assist;+2 for physical assistance  General bed mobility comments assisted L LE into bed and assisted trunk in repositioning  Transfers  Overall transfer level Needs assistance  Equipment used 2 person hand held assist;Rolling walker (2 wheeled) (with pad assist due to low surface height)  Transfers Squat Pivot Transfers  Squat pivot transfers Max assist;+2 physical assistance  PT - End of Session  Activity Tolerance Patient tolerated treatment well;Patient limited by pain  Patient left in bed;with call bell/phone within reach;with family/visitor present  Nurse Communication Mobility status  PT - Assessment/Plan  PT Plan Current plan remains appropriate  PT Frequency (ACUTE ONLY) Min 3X/week  Follow Up Recommendations SNF;Supervision/Assistance - 24 hour  PT equipment None recommended by PT  PT Goal Progression  Progress towards PT goals Progressing toward goals  Acute Rehab PT Goals  PT Goal Formulation With patient/family  Time For Goal Achievement 06/29/14  Potential to Achieve  Goals Good  PT General Charges  $$ ACUTE PT VISIT 1 Procedure  PT Treatments  $Therapeutic Activity 8-22 mins   06/16/2014  [place] BingKen Perri Lamagna, PT [phone]  (pager)

## 2014-06-16 NOTE — Progress Notes (Signed)
Subjective: Denies any complaints. Has difficulty rolling over due to left hip pain. Has been able to void on his own, has not had a bowel movement  Objective: Vital signs in last 24 hours: Filed Vitals:   06/16/14 0359 06/16/14 0515 06/16/14 0720 06/16/14 0902  BP: 118/71   108/59  Pulse: 86   86  Temp: 100.8 F (38.2 C) 100 F (37.8 C)  98.6 F (37 C)  TempSrc: Oral Oral  Oral  Resp: Height:      Weight:  206 lb 2.1 oz (93.5 kg)    SpO2: 97%   95%   Weight change: 4 lb 13.6 oz (2.2 kg)  Intake/Output Summary (Last 24 hours) at 06/16/14 0939 Last data filed at 06/16/14 0900  Gross per 24 hour  Intake 1975.42 ml  Output    650 ml  Net 1325.42 ml   General: NAD, laying in bed comfortably Lungs: clear to anterior auscultation Cardiac: RRR, no murmurs GI:  hypoactive bowel sounds, distended, non TTP Neuro: CN II-XII grossly intact   Lab Results: Basic Metabolic Panel:  Recent Labs Lab 06/15/14 0708 06/16/14 0828  NA 139 139  K 4.5 4.2  CL 114* 113*  CO2 20 19  GLUCOSE 206* 145*  BUN 51* 55*  CREATININE 2.19* 2.37*  CALCIUM 7.3* 7.0*   Liver Function Tests:  Recent Labs Lab 06/13/14 2210 06/14/14 0420  AST 66* 60*  ALT 55* 52  ALKPHOS 121* 118*  BILITOT 0.7 0.9  PROT 7.8 6.9  ALBUMIN 3.2* 2.9*   CBC:  Recent Labs Lab 06/13/14 2210  06/15/14 0708 06/16/14 0828  WBC 7.3  < > 8.9 10.5  NEUTROABS 4.8  --  6.4  --   HGB 10.6*  < > 8.3* 7.9*  HCT 32.8*  < > 25.5* 24.8*  MCV 72.9*  < > 73.1* 72.7*  PLT 122*  < > 95* 107*  < > = values in this interval not displayed. CBG:  Recent Labs Lab 06/15/14 1214 06/15/14 1716 06/15/14 2036 06/15/14 2358 06/16/14 0356 06/16/14 0825  GLUCAP 253* 201* 170* 110* 117* 152*   Coagulation:  Recent Labs Lab 06/13/14 2210  LABPROT 14.9  INR 1.16   Urinalysis:  Recent Labs Lab 06/14/14 0127 06/15/14 2112  COLORURINE YELLOW YELLOW  LABSPEC 1.013 1.015  PHURINE 5.0 5.0  GLUCOSEU  NEGATIVE NEGATIVE  HGBUR NEGATIVE LARGE*  BILIRUBINUR NEGATIVE NEGATIVE  KETONESUR NEGATIVE NEGATIVE  PROTEINUR 100* 30*  UROBILINOGEN 0.2 1.0  NITRITE NEGATIVE NEGATIVE  LEUKOCYTESUR NEGATIVE TRACE*   Studies/Results: Dg Chest 2 View  06/16/2014   CLINICAL DATA:  Fever, shortness of breath, post hip surgery, history essential hypertension, type 2 diabetes, coronary artery disease, former smoker  EXAM: CHEST  2 VIEW  COMPARISON:  06/15/2014  FINDINGS: Upper normal heart size.  Normal mediastinal contours and pulmonary vascularity.  Lungs clear.  No pleural effusion or pneumothorax.  Persistent elevation LEFT diaphragm.  Gaseous distention of stomach.  IMPRESSION: No acute pulmonary abnormalities identified.  Gaseous distention of stomach.   Electronically Signed   By: Ulyses Southward M.D.   On: 06/16/2014 08:16   Dg Chest 2 View  06/15/2014   CLINICAL DATA:  Post hip surgery (3/23), now with fever.  EXAM: CHEST  2 VIEW  COMPARISON:  06/13/2014; 05/24/2013; 07/14/2006  FINDINGS: Grossly unchanged enlarged cardiac silhouette and mediastinal contours given persistently reduced lung volumes. Evaluation the retrosternal clear space obscured secondary to overlying soft tissues. Worsening  bibasilar opacities left greater than right. Trace left-sided effusion is not excluded. No definite evidence of edema. No pneumothorax. Unchanged bones. There is mild to moderate gas distention of the stomach.  IMPRESSION: Decreased lung volumes with worsening bibasilar opacities, left greater than right, atelectasis versus infiltrate. Continued attention on follow-up is recommended.   Electronically Signed   By: Simonne ComeJohn  Watts M.D.   On: 06/15/2014 20:44   Pelvis Portable  06/14/2014   CLINICAL DATA:  Status post left hip arthroplasty.  EXAM: PORTABLE PELVIS 1-2 VIEWS  COMPARISON:  None.  FINDINGS: Single AP image shows the left hip prosthesis to be well-seated and aligned. No acute fracture or evidence of an operative  complication.  IMPRESSION: Well aligned left hip prosthesis.   Electronically Signed   By: Amie Portlandavid  Ormond M.D.   On: 06/14/2014 17:32   Medications: I have reviewed the patient's current medications. Scheduled Meds: . amLODipine  10 mg Oral q morning - 10a  . carvedilol  12.5 mg Oral BID WC  . docusate sodium  100 mg Oral BID  . enoxaparin (LOVENOX) injection  30 mg Subcutaneous Q24H  . insulin aspart  0-15 Units Subcutaneous 6 times per day  . sodium chloride  3 mL Intravenous Q12H  . terazosin  10 mg Oral QHS   Continuous Infusions: . sodium chloride    . sodium chloride 100 mL/hr at 06/16/14 0143   PRN Meds:.acetaminophen **OR** acetaminophen, alum & mag hydroxide-simeth, HYDROcodone-acetaminophen, hydroxypropyl methylcellulose / hypromellose, menthol-cetylpyridinium **OR** phenol, methocarbamol **OR** methocarbamol (ROBAXIN)  IV, metoCLOPramide **OR** metoCLOPramide (REGLAN) injection, morphine injection, ondansetron **OR** ondansetron (ZOFRAN) IV, promethazine Assessment/Plan: Principal Problem:   Closed displaced fracture of left femoral neck Active Problems:   Anemia of chronic renal failure, stage 3 (moderate)   Essential hypertension   Type 2 diabetes mellitus with peripheral artery disease   Type 2 diabetes mellitus with stage 3 chronic kidney disease   AKI (acute kidney injury)   Left displaced femoral neck fracture   Displaced left femoral neck fracture: day 2 s/p left femoral prosthetic replacement, pain is well controlled and pt tolerating diet.  - Dilaudid, oxy, morphine, norco, and fentanyl prn for pain control - Colace increased to 200 mg twice daily due to gaseous distention seen on CXR - PT recommends SNF, SW consulted  Fever--Tmax overnight was 100.1deg F. Pt reports fevers have subsided - Chest xray this morning neg for PNA or atelectasis, UA w/ trace LE but otherwise neg for UTI - blood cultures pending - tylenol prn for fevers - will cont to monitor  Type  2 diabetes complicated by retinopathy, peripheral vascular disease:  -moderate sliding scale insulin  Chronic kidney disease stage III: Creatinine 2.4 on admission, baseline 1.5-1.9. Creatinine 2.37 this morning.  - continue NS at 13000ml/hr,  will continue to monitor - holding ACEinh 2/2 low BPs, can restart as needed  Abnormal LFTs: Alkaline phosphatase 121, AST 66, ALT 55 on admission. Likely in the setting of his acute illness. PT/INR reassuring. -LFTs trending down, will continue to monitor  Hypertension: Home medications include amlodipine 10 mg, Coreg 12.5 mg twice daily, terazosin 10 mg at bedtime, lisinopril 10 mg. -Continue Coreg, amlodipine, terazosin  - holding ACEinh as BP low normal, can restart as needed  Coronary artery disease: aspirin 81 mg   Benign prostatic hypertrophy with nocturia: Terazosin as noted above.  #FEN:  - carb mod diet  #DVT prophylaxis: lovenox renally dose, SCDs  #CODE STATUS: FULL CODE -Defer to Drinda ButtsElizabeth Williams [[phone]]  if patients lacks decision-making capacity -Confirmed with patient on admission  Dispo: Disposition is deferred at this time, awaiting improvement of current medical problems.  The patient does have a current PCP Doneen Poisson, MD) and does need an [facility] hospital follow-up appointment after discharge.  The patient does have transportation limitations that hinder transportation to clinic appointments.  .Services Needed at time of discharge: Y = Yes, Blank = No PT:   OT:   RN:   Equipment:   Other:     LOS: 2 days   Denton Brick, MD 06/16/2014, 9:39 AM

## 2014-06-17 DIAGNOSIS — K219 Gastro-esophageal reflux disease without esophagitis: Secondary | ICD-10-CM

## 2014-06-17 LAB — CBC
HCT: 22.9 % — ABNORMAL LOW (ref 39.0–52.0)
Hemoglobin: 7.4 g/dL — ABNORMAL LOW (ref 13.0–17.0)
MCH: 23.4 pg — ABNORMAL LOW (ref 26.0–34.0)
MCHC: 32.3 g/dL (ref 30.0–36.0)
MCV: 72.5 fL — ABNORMAL LOW (ref 78.0–100.0)
Platelets: 106 10*3/uL — ABNORMAL LOW (ref 150–400)
RBC: 3.16 MIL/uL — ABNORMAL LOW (ref 4.22–5.81)
RDW: 13.9 % (ref 11.5–15.5)
WBC: 9.6 10*3/uL (ref 4.0–10.5)

## 2014-06-17 LAB — GLUCOSE, CAPILLARY
Glucose-Capillary: 186 mg/dL — ABNORMAL HIGH (ref 70–99)
Glucose-Capillary: 163 mg/dL — ABNORMAL HIGH (ref 70–99)
Glucose-Capillary: 183 mg/dL — ABNORMAL HIGH (ref 70–99)
Glucose-Capillary: 171 mg/dL — ABNORMAL HIGH (ref 70–99)

## 2014-06-17 MED ORDER — PANTOPRAZOLE SODIUM 40 MG PO TBEC
40.0000 mg | DELAYED_RELEASE_TABLET | Freq: Every day | ORAL | Status: DC
  Administered 2014-06-17 – 2014-06-18 (×2): 40 mg via ORAL
  Filled 2014-06-17 (×3): qty 1

## 2014-06-17 MED ORDER — DOCUSATE SODIUM 100 MG PO CAPS
100.0000 mg | ORAL_CAPSULE | Freq: Two times a day (BID) | ORAL | Status: DC
  Filled 2014-06-17 (×2): qty 1

## 2014-06-17 MED ORDER — GI COCKTAIL ~~LOC~~
30.0000 mL | Freq: Once | ORAL | Status: AC
  Administered 2014-06-17: 30 mL via ORAL
  Filled 2014-06-17 (×2): qty 30

## 2014-06-17 NOTE — Progress Notes (Signed)
Subjective: 3 Days Post-Op Procedure(s) (LRB): HEMI HIP ARTHROPLASTY ANTERIOR APPROACH (Left) Patient reports pain as 5 on 0-10 scale.    Objective: Vital signs in last 24 hours: Temp:  [99.1 F (37.3 C)-100 F (37.8 C)] 100 F (37.8 C) (03/25 2109) Pulse Rate:  [83-86] 84 (03/26 0915) Resp:  [18-28] 18 (03/26 0800) BP: (111-141)/(51-68) 141/64 mmHg (03/26 0908) SpO2:  [89 %-98 %] 93 % (03/26 0800) Weight:  [92.3 kg (203 lb 7.8 oz)] 92.3 kg (203 lb 7.8 oz) (03/26 0629)  Intake/Output from previous day: 03/25 0701 - 03/26 0700 In: 3360 [P.O.:960; I.V.:2400] Out: 1450 [Urine:1450] Intake/Output this shift: Total I/O In: 120 [P.O.:120] Out: 250 [Urine:250]   Recent Labs  06/15/14 0708 06/16/14 0828 06/17/14 0609  HGB 8.3* 7.9* 7.4*    Recent Labs  06/16/14 0828 06/17/14 0609  WBC 10.5 9.6  RBC 3.41* 3.16*  HCT 24.8* 22.9*  PLT 107* 106*    Recent Labs  06/15/14 0708 06/16/14 0828  NA 139 139  K 4.5 4.2  CL 114* 113*  CO2 20 19  BUN 51* 55*  CREATININE 2.19* 2.37*  GLUCOSE 206* 145*  CALCIUM 7.3* 7.0*   No results for input(s): LABPT, INR in the last 72 hours.  Neurologically intact sciatic function intact. aquacell dry.l   Assessment/Plan: 3 Days Post-Op Procedure(s) (LRB): HEMI HIP ARTHROPLASTY ANTERIOR APPROACH (Left) Up with therapy   Was on colace and had 3 loose stools. c diff culture sent results pending. Placed on contact isolation per protocol pending results.   Loose BM likely from colace.    Tennille Montelongo C 06/17/2014, 11:50 AM

## 2014-06-17 NOTE — Progress Notes (Addendum)
Subjective: Pt complaining of heart burn. Had 2 large bowel movements overnight and none this morning. States he is eating well, urinating well, and having flatus.   Objective: Vital signs in last 24 hours: Filed Vitals:   06/16/14 1626 06/16/14 1627 06/16/14 2109 06/17/14 0629  BP: 115/61  111/51   Pulse:  86 83   Temp:   100 F (37.8 C)   TempSrc:   Oral   Resp:   28   Height:      Weight:    203 lb 7.8 oz (92.3 kg)  SpO2:   92%    Weight change: -2 lb 10.3 oz (-1.2 kg)  Intake/Output Summary (Last 24 hours) at 06/17/14 0756 Last data filed at 06/17/14 0500  Gross per 24 hour  Intake 3360.01 ml  Output   1275 ml  Net 2085.01 ml   General: NAD, laying in bed comfortably Lungs: clear to anterior auscultation Cardiac: RRR, no murmurs GI:  Active bowel sounds distended, non TTP Neuro: CN II-XII grossly intact Ext: left hip wound dressing changed, dressing dry and intact, no drainage  Lab Results: Basic Metabolic Panel:  Recent Labs Lab 06/15/14 0708 06/16/14 0828  NA 139 139  K 4.5 4.2  CL 114* 113*  CO2 20 19  GLUCOSE 206* 145*  BUN 51* 55*  CREATININE 2.19* 2.37*  CALCIUM 7.3* 7.0*   Liver Function Tests:  Recent Labs Lab 06/13/14 2210 06/14/14 0420  AST 66* 60*  ALT 55* 52  ALKPHOS 121* 118*  BILITOT 0.7 0.9  PROT 7.8 6.9  ALBUMIN 3.2* 2.9*   CBC:  Recent Labs Lab 06/13/14 2210  06/15/14 0708 06/16/14 0828  WBC 7.3  < > 8.9 10.5  NEUTROABS 4.8  --  6.4  --   HGB 10.6*  < > 8.3* 7.9*  HCT 32.8*  < > 25.5* 24.8*  MCV 72.9*  < > 73.1* 72.7*  PLT 122*  < > 95* 107*  < > = values in this interval not displayed. CBG:  Recent Labs Lab 06/15/14 2358 06/16/14 0356 06/16/14 0825 06/16/14 1159 06/16/14 1700 06/16/14 2111  GLUCAP 110* 117* 152* 157* 192* 154*   Coagulation:  Recent Labs Lab 06/13/14 2210  LABPROT 14.9  INR 1.16   Urinalysis:  Recent Labs Lab 06/14/14 0127 06/15/14 2112  COLORURINE YELLOW YELLOW  LABSPEC  1.013 1.015  PHURINE 5.0 5.0  GLUCOSEU NEGATIVE NEGATIVE  HGBUR NEGATIVE LARGE*  BILIRUBINUR NEGATIVE NEGATIVE  KETONESUR NEGATIVE NEGATIVE  PROTEINUR 100* 30*  UROBILINOGEN 0.2 1.0  NITRITE NEGATIVE NEGATIVE  LEUKOCYTESUR NEGATIVE TRACE*   Studies/Results: Dg Chest 2 View  06/16/2014   CLINICAL DATA:  Fever, shortness of breath, post hip surgery, history essential hypertension, type 2 diabetes, coronary artery disease, former smoker  EXAM: CHEST  2 VIEW  COMPARISON:  06/15/2014  FINDINGS: Upper normal heart size.  Normal mediastinal contours and pulmonary vascularity.  Lungs clear.  No pleural effusion or pneumothorax.  Persistent elevation LEFT diaphragm.  Gaseous distention of stomach.  IMPRESSION: No acute pulmonary abnormalities identified.  Gaseous distention of stomach.   Electronically Signed   By: Ulyses SouthwardMark  Boles M.D.   On: 06/16/2014 08:16   Dg Chest 2 View  06/15/2014   CLINICAL DATA:  Post hip surgery (3/23), now with fever.  EXAM: CHEST  2 VIEW  COMPARISON:  06/13/2014; 05/24/2013; 07/14/2006  FINDINGS: Grossly unchanged enlarged cardiac silhouette and mediastinal contours given persistently reduced lung volumes. Evaluation the retrosternal clear space obscured secondary to  overlying soft tissues. Worsening bibasilar opacities left greater than right. Trace left-sided effusion is not excluded. No definite evidence of edema. No pneumothorax. Unchanged bones. There is mild to moderate gas distention of the stomach.  IMPRESSION: Decreased lung volumes with worsening bibasilar opacities, left greater than right, atelectasis versus infiltrate. Continued attention on follow-up is recommended.   Electronically Signed   By: Simonne Come M.D.   On: 06/15/2014 20:44   Medications: I have reviewed the patient's current medications. Scheduled Meds: . amLODipine  10 mg Oral q morning - 10a  . aspirin EC  81 mg Oral q morning - 10a  . carvedilol  12.5 mg Oral BID WC  . docusate sodium  200 mg Oral  BID  . enoxaparin (LOVENOX) injection  30 mg Subcutaneous Q24H  . insulin aspart  0-15 Units Subcutaneous TID AC & HS  . sodium chloride  3 mL Intravenous Q12H  . terazosin  10 mg Oral QHS   Continuous Infusions: . sodium chloride    . sodium chloride 100 mL/hr at 06/17/14 0045   PRN Meds:.acetaminophen **OR** acetaminophen, alum & mag hydroxide-simeth, HYDROcodone-acetaminophen, hydroxypropyl methylcellulose / hypromellose, menthol-cetylpyridinium **OR** phenol, methocarbamol **OR** methocarbamol (ROBAXIN)  IV, metoCLOPramide **OR** metoCLOPramide (REGLAN) injection, morphine injection, ondansetron **OR** ondansetron (ZOFRAN) IV, promethazine Assessment/Plan: Principal Problem:   Closed displaced fracture of left femoral neck Active Problems:   Anemia of chronic renal failure, stage 3 (moderate)   Essential hypertension   Type 2 diabetes mellitus with peripheral artery disease   Type 2 diabetes mellitus with stage 3 chronic kidney disease   AKI (acute kidney injury)   Left displaced femoral neck fracture   Fall   Fever   Displaced left femoral neck fracture: day 3 s/p left femoral prosthetic replacement, pain is well controlled and pt tolerating diet. Has not required any pain medications overnight. - Dilaudid, oxy, morphine, norco, and fentanyl prn for pain control - Colace  BID, Cdiff ordered by nursing staff due to 2 BMs overnight, this was cancelled as large BMs expected due to increase in colace yesterday as well as constipation from pain medications.  - PT recommends SNF, SW consulted, awaiting bed availability  Fever--Tmax overnight was 100.0 deg F. CXR and UA negative. Likely pt experiencing post op fevers, no signs of infxn, will cont to monitor.  - blood cultures NGTD - tylenol prn for fevers  Type 2 diabetes complicated by retinopathy, peripheral vascular disease:  -moderate sliding scale insulin  Chronic kidney disease stage III: Creatinine 2.4 on admission,  baseline 1.5-1.9.  - repeat CMET tomorrow morning - d/c'd IVF  will continue to monitor - holding ACEinh 2/2 low BPs, can restart as needed  Abnormal LFTs: Alkaline phosphatase 121, AST 66, ALT 55 on admission. Likely in the setting of his acute illness. PT/INR reassuring. -LFTs trending down, will continue to monitor - CMET tomorrow morning  Hypertension: Home medications include amlodipine 10 mg, Coreg 12.5 mg twice daily, terazosin 10 mg at bedtime, lisinopril 10 mg. -Continue Coreg, amlodipine, terazosin  - holding ACEinh as BP low normal, can restart as needed  Coronary artery disease: aspirin 81 mg   Benign prostatic hypertrophy with nocturia: Terazosin as noted above.  GERD-- complaining of heart burn. Having burning chest pain mid epigastric region. Not on any GERD home meds.  - GI cocktail - protonix  daily  #FEN:  - carb mod diet  #DVT prophylaxis: lovenox renally dose, SCDs  #CODE STATUS: FULL CODE -Defer to Drinda Butts [phone]] if  patients lacks decision-making capacity -Confirmed with patient on admission  Dispo: Discharge to SNF when bed available.  The patient does have a current PCP Doneen Poisson, MD) and does need an [facility] hospital follow-up appointment after discharge.  The patient does have transportation limitations that hinder transportation to clinic appointments.  .Services Needed at time of discharge: Y = Yes, Blank = No PT:   OT:   RN:   Equipment:   Other:     LOS: 3 days   Denton Brick, MD 06/17/2014, 7:56 AM

## 2014-06-17 NOTE — Progress Notes (Signed)
Internal Medicine Attending  Date: 06/17/2014  Patient name: [name] Medical record number: [id number] Date of birth: 12/08/1945 Age: 69 y.o. Gender: male  I saw and evaluated the patient. I reviewed the resident's note by Dr. Danella Pentonruong and I agree with the resident's findings and plans as documented in her progress note.  [name] had 3 loose bowel movements yesterday after receiving the Colace. He has not had a bowel movement since to collect for C. difficile testing. I suspect these bowel movements were related to the Colace rather then C. difficile since he has had no further stooling. He notes typical heartburn with burning of the midesophagus up into the throat. He has had this in the past and has previously been on proton pump inhibitor therapy. This was stopped about a half a year ago in the outpatient setting as he felt he no longer needed it. I agree with restarting the PPI therapy for reflux and giving him a GI cocktail acutely for his symptoms. His cardiac exam is unremarkable and the dressing over the left thigh incision is clean and dry. Skilled nursing facility placement is pending but required for postoperative physical therapy. We will otherwise continue supportive care until transfer is arranged.

## 2014-06-18 LAB — GLUCOSE, CAPILLARY
GLUCOSE-CAPILLARY: 147 mg/dL — AB (ref 70–99)
Glucose-Capillary: 164 mg/dL — ABNORMAL HIGH (ref 70–99)

## 2014-06-18 NOTE — Progress Notes (Signed)
Internal Medicine Attending  Date: 06/18/2014  Patient name: [name] Medical record number: [id number] Date of birth: 07/01/1945 Age: 69 y.o. Gender: male  I saw and evaluated the patient. I reviewed the resident's note by Dr. Danella Pentonruong and I agree with the resident's findings and plans as documented in her progress note.  [name] was doing well this morning denying any indigestion and having had only one bowel movement yesterday. He states he's ready for [facility] in order to continue with his rehabilitation after his hip fracture and replacement. The incisional bandage was clean and dry once again on examination today. I agree with transfer for further rehabilitation.

## 2014-06-18 NOTE — Social Work (Signed)
CSW faxed discharge [facility] and prepared discharge packet. RN will call transport as patient is ready for discharge. CSW also spoke with patient and wife in order to make them aware of transition to [facility] on today. Patient and wife agreeable to transport today.   No further CSW needs as patient is discharging today.  Beverly Sessionsywan J Kylie Simmonds MSW, LCSW [phone]

## 2014-06-18 NOTE — Progress Notes (Signed)
Dr. Vivianne Masterroung paged to make aware of patient's 8 beats of Vtach at 0739. VS stable. RN assessment following tele notification patient was asleep, patient woken up and said  "I feel okay". Patient asked further questions and denied any cardiac symptoms. Patient still on cardiac monitor until further orders from MD.   8:07 AM Dr. Mikey BussingHoffman paged.   0815: Dr. Mikey BussingHoffman responded, updated on patient status. Order to keep on telemetry until further notice.

## 2014-06-18 NOTE — Progress Notes (Signed)
11:38 AM Report given to Paulina,Nurse [facility]

## 2014-06-18 NOTE — Progress Notes (Signed)
Subjective: Pt has no complaints. States heart burn has resolved. Had 1 BM yesterday, eating well, and pain well controlled.  Objective: Vital signs in last 24 hours: Filed Vitals:   06/18/14 0000 06/18/14 0400 06/18/14 0514 06/18/14 0748  BP:   130/65 125/69  Pulse:   76 74  Temp:   99 F (37.2 C) 98.6 F (37 C)  TempSrc:   Oral Oral  Resp: Height:      Weight:   220 lb 7.4 oz (100 kg)   SpO2: 95% 94% 93% 94%   Weight change: 16 lb 15.6 oz (7.7 kg)  Intake/Output Summary (Last 24 hours) at 06/18/14 0848 Last data filed at 06/18/14 0514  Gross per 24 hour  Intake    632 ml  Output    551 ml  Net     81 ml   General: NAD, laying in bed comfortably Lungs: CTAB, decreased air movement on left lung base Cardiac: RRR, no murmurs GI:  Active bowel sounds, distended, non TTP Neuro: CN II-XII grossly intact Ext: left hip wound dressing dry and intact, no drainage  Lab Results: Basic Metabolic Panel:  Recent Labs Lab 06/15/14 0708 06/16/14 0828  NA 139 139  K 4.5 4.2  CL 114* 113*  CO2 20 19  GLUCOSE 206* 145*  BUN 51* 55*  CREATININE 2.19* 2.37*  CALCIUM 7.3* 7.0*   Liver Function Tests:  Recent Labs Lab 06/13/14 2210 06/14/14 0420  AST 66* 60*  ALT 55* 52  ALKPHOS 121* 118*  BILITOT 0.7 0.9  PROT 7.8 6.9  ALBUMIN 3.2* 2.9*   CBC:  Recent Labs Lab 06/13/14 2210  06/15/14 0708 06/16/14 0828 06/17/14 0609  WBC 7.3  < > 8.9 10.5 9.6  NEUTROABS 4.8  --  6.4  --   --   HGB 10.6*  < > 8.3* 7.9* 7.4*  HCT 32.8*  < > 25.5* 24.8* 22.9*  MCV 72.9*  < > 73.1* 72.7* 72.5*  PLT 122*  < > 95* 107* 106*  < > = values in this interval not displayed. CBG:  Recent Labs Lab 06/16/14 2111 06/17/14 0806 06/17/14 1146 06/17/14 1720 06/17/14 2139 06/18/14 0832  GLUCAP 154* 186* 163* 183* 171* 147*   Coagulation:  Recent Labs Lab 06/13/14 2210  LABPROT 14.9  INR 1.16   Urinalysis:  Recent Labs Lab 06/14/14 0127 06/15/14 2112    COLORURINE YELLOW YELLOW  LABSPEC 1.013 1.015  PHURINE 5.0 5.0  GLUCOSEU NEGATIVE NEGATIVE  HGBUR NEGATIVE LARGE*  BILIRUBINUR NEGATIVE NEGATIVE  KETONESUR NEGATIVE NEGATIVE  PROTEINUR 100* 30*  UROBILINOGEN 0.2 1.0  NITRITE NEGATIVE NEGATIVE  LEUKOCYTESUR NEGATIVE TRACE*   Studies/Results: No results found. Medications: I have reviewed the patient's current medications. Scheduled Meds: . amLODipine  10 mg Oral q morning - 10a  . aspirin EC  81 mg Oral q morning - 10a  . carvedilol  12.5 mg Oral BID WC  . docusate sodium  100 mg Oral BID  . enoxaparin (LOVENOX) injection  30 mg Subcutaneous Q24H  . insulin aspart  0-15 Units Subcutaneous TID AC & HS  . pantoprazole  40 mg Oral Daily  . sodium chloride  3 mL Intravenous Q12H  . terazosin  10 mg Oral QHS   Continuous Infusions:   PRN Meds:.acetaminophen **OR** acetaminophen, alum & mag hydroxide-simeth, HYDROcodone-acetaminophen, hydroxypropyl methylcellulose / hypromellose, menthol-cetylpyridinium **OR** phenol, methocarbamol **OR** methocarbamol (ROBAXIN)  IV, metoCLOPramide **OR** metoCLOPramide (REGLAN) injection, morphine injection, ondansetron **OR**  ondansetron (ZOFRAN) IV, promethazine Assessment/Plan: Principal Problem:   Closed displaced fracture of left femoral neck Active Problems:   Anemia of chronic renal failure, stage 3 (moderate)   Essential hypertension   Type 2 diabetes mellitus with peripheral artery disease   Type 2 diabetes mellitus with stage 3 chronic kidney disease   AKI (acute kidney injury)   Left displaced femoral neck fracture   Fall   Fever   Esophageal reflux   Displaced left femoral neck fracture: day 4 s/p left femoral prosthetic replacement, pain is well controlled and pt tolerating diet.  -  Discharge to SNF today w/ oxy IR for pain  Fever--pt afebrile overnight, likely pt experienced post op fevers - blood cultures NGTD, urine culture neg - tylenol prn for fevers  Type 2 diabetes  complicated by retinopathy, peripheral vascular disease:  -moderate sliding scale insulin  Chronic kidney disease stage III: Creatinine 2.4 on admission down to 2.37 on 3/25, baseline 1.5-1.9.  - holding ACEinh 2/2 low BPs, will recommend repeat BMET at SNF and resuming as needed, currently normotensive  Abnormal LFTs: Alkaline phosphatase 121, AST 66, ALT 55 on admission. Likely in the setting of his acute illness. PT/INR reassuring. -LFTs trended down, can f/u as outpatient  Hypertension: Home medications include amlodipine 10 mg, Coreg 12.5 mg twice daily, terazosin 10 mg at bedtime, lisinopril 10 mg. -Continue Coreg, amlodipine, terazosin  - holding ACEinh as BP low normal, can restart as needed  Coronary artery disease: aspirin 81 mg   Benign prostatic hypertrophy with nocturia: Terazosin as noted above.  GERD-- complaining of heart burn. Having burning chest pain mid epigastric region. Not on any GERD home meds.  - GI cocktail - protonix 40mg  daily  #FEN:  - carb mod diet  #DVT prophylaxis: lovenox renally dose, SCDs  #CODE STATUS: FULL CODE -Defer to Drinda ButtsElizabeth Williams [[phone]] if patients lacks decision-making capacity -Confirmed with patient on admission  Dispo: Discharge to SNF today  The patient does have a current PCP Doneen Poisson(Lawrence Klima, MD) and [facility] after discharge.  The patient does have transportation limitations that hinder transportation to clinic appointments.  .Services Needed at time of discharge: Y = Yes, Blank = No PT:   OT:   RN:   Equipment:   Other:     LOS: 4 days   Denton Brickiana M Reiley Bertagnolli, MD 06/18/2014, 8:48 AM

## 2014-06-18 NOTE — Discharge Summary (Signed)
Name: [name] [name] MRN: [id number] DOB: 1946-01-31 69 y.o. 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     aspirin EC 81 MG tablet  Take 81 mg by mouth every morning.     calcium citrate-vitamin D 315-200 MG-UNIT per tablet  Commonly known as:  CITRACAL+D  Take 1 tablet by mouth daily.     carvedilol 12.5 MG tablet  Commonly known as:  COREG  Take 1 tablet (12.5 mg total) by mouth 2 (two) times daily with a meal.     enoxaparin 30 MG/0.3ML injection  Commonly known as:  LOVENOX  Inject 0.3 mLs (30 mg total) into the skin daily.     fish oil-omega-3 fatty acids 1000 MG capsule  Take 1 g by mouth daily.     fluticasone 50 MCG/ACT nasal spray  Commonly known as:  FLONASE  Place 2 sprays into both nostrils daily as needed for allergies.     furosemide 40 MG tablet  Commonly known as:  LASIX  Take as needed for a weight gain of more than 5 lbs in a week.     hydroxypropyl methylcellulose / hypromellose 2.5 % ophthalmic solution  Commonly known as:  ISOPTO TEARS / GONIOVISC  Place 2 drops into both eyes 4 (four) times daily as needed for dry eyes.     insulin aspart protamine -  aspart (70-30) 100 UNIT/ML FlexPen  Commonly known as:  NOVOLOG MIX 70/30 FLEXPEN  Inject 0.3 mLs (30 Units total) into the skin 2 (two) times daily.     Insulin Pen Needle 30G X 8 MM Misc  Commonly known as:  NOVOFINE  Inject SQ as directed twice daily, use new pen needle for each injection     multivitamin with minerals tablet  Take 1 tablet by mouth daily.     oxyCODONE 5 MG immediate release tablet  Commonly known as:  Oxy IR/ROXICODONE  Take 1-3 tablets (5-15 mg total) by mouth every 4 (four) hours as needed.     terazosin 10 MG capsule  Commonly known as:  HYTRIN  Take 10 mg by mouth at bedtime.        Disposition and follow-up:   Mr.Aaronjames R Thaxton was discharged from [facility] in Stable condition.  At the hospital follow up visit please address:  1.  Lisinopril was held on discharge, please reassess creatinine and resume as needed.   2.  Labs / imaging needed at time of follow-up: CMET  3.  Pending labs/ test needing follow-up: final read of blood cultures (3/24), currently NGTD  Follow-up  Appointments:     Follow-up Information    Follow up with Cheral Almas, MD In 2 weeks.   Specialty:  Orthopedic Surgery   Why:  For suture removal, For wound re-check   Contact information:   [address] [place] [address] [phone]       Discharge Instructions: Discharge Instructions    Weight bearing as tolerated    Complete by:  As directed            Consultations:   orthopedics  Procedures Performed:  Dg Chest 1 View  06/13/2014   CLINICAL DATA:  Fall.  Left hip pain.  Left femoral neck fracture.  EXAM: CHEST  1 VIEW  COMPARISON:  05/24/2013  FINDINGS: Prominent right upper paratracheal stripe and mild prominence of soft tissue density above the aortic arch, both worsened from 05/24/2013. Mediastinal width in this vicinity is 9.7 cm. Coronary atherosclerotic calcification is visible. Borderline cardiomegaly. No edema.   IMPRESSION: 1. Increased prominence of upper mediastinal soft tissues. I cannot exclude adenopathy or mass given the change from prior, although some of the appearance is probably attributable to the AP supine technique. Given that an PA upright view of the chest may be problematic due to the patient's hip fracture currently, chest CT should be considered. 2. Borderline cardiomegaly.   Electronically Signed   By: Gaylyn Rong M.D.   On: 06/13/2014 22:22   Dg Chest 2 View  06/16/2014   CLINICAL DATA:  Fever, shortness of breath, post hip surgery, history essential hypertension, type 2 diabetes, coronary artery disease, former smoker  EXAM: CHEST  2 VIEW  COMPARISON:  06/15/2014  FINDINGS: Upper normal heart size.  Normal mediastinal contours and pulmonary vascularity.  Lungs clear.  No pleural effusion or pneumothorax.  Persistent elevation LEFT diaphragm.  Gaseous distention of stomach.  IMPRESSION: No acute pulmonary abnormalities identified.  Gaseous distention of stomach.   Electronically Signed   By: Ulyses Southward M.D.   On: 06/16/2014 08:16   Dg Chest 2 View  06/15/2014   CLINICAL DATA:  Post hip surgery (3/23), now with fever.  EXAM: CHEST  2 VIEW  COMPARISON:  06/13/2014; 05/24/2013; 07/14/2006  FINDINGS: Grossly unchanged enlarged cardiac silhouette and mediastinal contours given persistently reduced lung volumes. Evaluation the retrosternal clear space obscured secondary to overlying soft tissues. Worsening bibasilar opacities left greater than right. Trace left-sided effusion is not excluded. No definite evidence of edema. No pneumothorax. Unchanged bones. There is mild to moderate gas distention of the stomach.  IMPRESSION: Decreased lung volumes with worsening bibasilar opacities, left greater than right, atelectasis versus infiltrate. Continued attention on follow-up is recommended.   Electronically Signed   By: Simonne Come M.D.   On: 06/15/2014 20:44   Pelvis Portable  06/14/2014    CLINICAL DATA:  Status post left hip arthroplasty.  EXAM: PORTABLE PELVIS 1-2 VIEWS  COMPARISON:  None.  FINDINGS: Single AP image shows the left hip prosthesis to be well-seated and aligned. No acute fracture or evidence of an operative complication.  IMPRESSION: Well aligned left hip prosthesis.   Electronically Signed   By: Amie Portland M.D.   On: 06/14/2014 17:32   Dg Hip Unilat With Pelvis 2-3 Views Left  06/13/2014   CLINICAL DATA:  Fall tonight with left hip pain  EXAM: LEFT HIP (WITH PELVIS) 2-3 VIEWS  COMPARISON:  None.  FINDINGS: There is a mildly displaced acute fracture of the left femoral neck, mainly transcervical. The left femoral head  remains located.  No evidence of pelvic ring fracture or diastasis.  Osteopenia.  The right hip is negative.  IMPRESSION: Mildly displaced left femoral neck fracture.   Electronically Signed   By: Marnee SpringJonathon  Watts M.D.   On: 06/13/2014 21:59     Admission HPI: [name] is a 69 year old male with type 2 diabetes complicated by retinopathy, peripheral vascular disease, hypertension, chronic kidney disease stage III, BPH, coronary artery disease presents to the ED after sustaining a fall. His wife was present the time of interview and contributed.  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 He attempted to go up the ramp to his house was unable to do so given the low battery. He then attempted to walk up by clutching the railing. In doing so, he fell on his side and was down for roughly 10 minutes before his wife could to his aid. While down, he reports not being able to move the leg but mainly because of the pain and denies loss of consciousness, numbness/tingling, dizziness, or hitting his head. His wife reports that it has been roughly 2-3 years since his last fall. At baseline, she reports that he uses a walker to ambulate around the house but goes in a power chair when he is outside. 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 He was recently seen by his PCP, Dr. Josem KaufmannKlima, on 05/25/2012 after which he was restarted on lisinopril 10 mg given stable renal function. He does report diarrhea for the last 2-3 days after he ate something that upset his stomach but denies any abdominal pain, hematuria, hematochezia, fever, recent antibiotic use, poor appetite, sick contacts, chest pain, shortness of breath, missing any of his regular medications. In the ED, hip x-ray was notable for mildly displaced left femoral neck fracture. He was also found to have potassium 6.5 and given 1 amp of sodium bicarbonate, D50, NovoLog 5 units, calcium gluconate IV along with 500 mL normal saline bolus.  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 Pt tolerated procedure well and ready for discharge to SNF on postoperative day 4. He required minimal pain medications, and pain was well controlled. He did experience post op fevers w/ Tmax of 101.8 deg F that responded to tylenol. CXR on 3/25 neg for atelectasis PNA or atelectasis, urine culture negative, blood cultures NGTD.   Hyperkalemia: Treated as noted in the history of present illness. He appears to have a history of intermittent hyperkalemia getting as far back as 2008 per chart review and presumably the reason why his lisinopril was discontinued and later restarted. EKG findings reassuring. Hyperkalemia resolved the next day down to 5.0 from 6.5 on  admission.   Acute on chronic kidney disease stage III: Creatinine 2.4 on admission, baseline 1.5-1.9. Likely in the setting of dehydration as he noted being outside for many hours earlier prior to admission and recent diarrheal illness.  Consistent with physical exam findings of dry mucous members on presentation. Creatinine 2.37 on 3/25. Held lisinopril due to AKI, can resume as needed on discharge.   Abnormal LFTs: Alkaline phosphatase 121, AST 66, ALT 55 on admission. Likely in the setting of his acute illness. PT/INR reassuring. LFTs trended down. Can repeat LFTs as outpatient.  Hypertension: Home medications include amlodipine 10 mg, Coreg 12.5 mg twice daily, terazosin 10 mg at bedtime, lisinopril 10 mg. Continued home meds except for lisinopril due to AKI. Pt normotensive during admission, can resume lisinopril as needed.   Microcytic anemia: Hemoglobin 10.6 on admission, baseline 10-11. MCV 72.9 on admission, baseline low 70s. Anemia panel 07/07/2006 notable for iron 34, ferritin 165, TIBC 313 suggestive of iron deficiency though may be confounded given his chronic kidney disease. Colonoscopy 02/19/2006 notable for 5 mm sessile polyp in the rectum without signs of malignancy per biopsy along with internal and external hemorrhoids. 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 Hemoglobin electrophoresis 07/07/2006 unremarkable for thalassemia. He was typed and screened the ED. Hemoglobin stable during admission.   Discharge Vitals:   BP 125/69 mmHg  Pulse 74  Temp(Src) 98.6 F (37 C) (Oral)  Resp 16  Ht 5\' 8"  (1.727 m)  Wt 220 lb 7.4 oz (100 kg)  BMI 33.53 kg/m2  SpO2 94%  Discharge Labs:  Results for orders placed or performed during the hospital encounter of 06/13/14 (from the past 24 hour(s))  Glucose, capillary     Status: Abnormal   Collection Time: 06/17/14 11:46 AM  Result Value Ref Range   Glucose-Capillary 163 (H) 70 - 99 mg/dL  Glucose, capillary     Status:  Abnormal   Collection Time: 06/17/14  5:20 PM  Result Value Ref Range   Glucose-Capillary 183 (H) 70 - 99 mg/dL  Glucose, capillary     Status: Abnormal   Collection Time: 06/17/14  9:39 PM  Result Value Ref Range   Glucose-Capillary 171 (H) 70 - 99 mg/dL  Glucose, capillary     Status: Abnormal   Collection Time: 06/18/14  8:32 AM  Result Value Ref Range   Glucose-Capillary 147 (H) 70 - 99 mg/dL    Signed: Denton Brick, MD 06/18/2014, 8:58 AM    Services Ordered on Discharge: SNF Equipment Ordered on Discharge: none

## 2014-06-18 NOTE — Progress Notes (Signed)
[name] to be D/C'd Skilled nursing facility per MD order.  Discussed with the patient and all questions fully answered.  VSS. Left hip dressing changed. Site clean dry and staples intact. IV catheter discontinued intact. Site without signs and symptoms of complications. Dressing and pressure applied.  An After Visit Summary was printed and given to the patient. Patient received prescription.  D/c education completed with patient/family including follow up instructions, medication list, d/c activities limitations if indicated, with other d/c instructions as indicated by MD - patient able to verbalize understanding, all questions fully answered.   Patient instructed to return to ED, call 911, or call MD for any changes in condition.   Patient escorted via WC, and D/C home via private auto.  [name], [name] 06/18/2014 12:59 PM

## 2014-06-19 ENCOUNTER — Encounter (HOSPITAL_COMMUNITY): Payer: Self-pay | Admitting: Orthopaedic Surgery

## 2014-06-19 ENCOUNTER — Non-Acute Institutional Stay (SKILLED_NURSING_FACILITY): Payer: Medicare PPO | Admitting: Registered Nurse

## 2014-06-19 DIAGNOSIS — N189 Chronic kidney disease, unspecified: Secondary | ICD-10-CM

## 2014-06-19 DIAGNOSIS — N401 Enlarged prostate with lower urinary tract symptoms: Secondary | ICD-10-CM | POA: Diagnosis not present

## 2014-06-19 DIAGNOSIS — Z89611 Acquired absence of right leg above knee: Secondary | ICD-10-CM | POA: Diagnosis not present

## 2014-06-19 DIAGNOSIS — D631 Anemia in chronic kidney disease: Secondary | ICD-10-CM | POA: Diagnosis not present

## 2014-06-19 DIAGNOSIS — N183 Chronic kidney disease, stage 3 unspecified: Secondary | ICD-10-CM

## 2014-06-19 DIAGNOSIS — S72002D Fracture of unspecified part of neck of left femur, subsequent encounter for closed fracture with routine healing: Secondary | ICD-10-CM | POA: Diagnosis not present

## 2014-06-19 DIAGNOSIS — R351 Nocturia: Secondary | ICD-10-CM | POA: Diagnosis not present

## 2014-06-19 DIAGNOSIS — I10 Essential (primary) hypertension: Secondary | ICD-10-CM | POA: Diagnosis not present

## 2014-06-19 DIAGNOSIS — E118 Type 2 diabetes mellitus with unspecified complications: Secondary | ICD-10-CM

## 2014-06-19 DIAGNOSIS — I2510 Atherosclerotic heart disease of native coronary artery without angina pectoris: Secondary | ICD-10-CM | POA: Diagnosis not present

## 2014-06-19 NOTE — Progress Notes (Addendum)
Patient ID: [name] [name], male   DOB: February 12, 1946, 69 y.o.   MRN: [id number]   Place of Service: [facility]  Allergies  Allergen Reactions  . Amoxicillin Swelling and Other (See Comments)    Puffy eyes and abdominal pain with Amoxicillin PO  . Pravastatin Other (See Comments)    Weakness and fatigue  . Tamsulosin Other (See Comments)    REACTION: dry throat, sweating, blurred vision  . Doxycycline Rash and Other (See Comments)    Questionable drug rxn rash    Code Status: Full Code  Goals of Care: Longevity/STR  Chief Complaint  Patient presents with  . Hospitalization Follow-up    HPI 69 y.o. male with PMH of HTN, DM2 with complications, CKD3, anemia, BPH, s/p AKA, DJD among others is being seen for a follow-up visit post hospital admission from 06/13/14 to 06/18/14 with closed displaced fracture of left femoral neck s/p fall. He underwent hemiarthroplasty of left hip. He is here for short term rehabilitation and his goal is to return home. Seen in therapy room today. Reported pain is adequately controlled with current regimen. No issues with constipation.   Review of Systems Constitutional: Negative for fever, chills, and fatigue. HENT: Negative for ear pain, congestion, and sore throat Eyes: Negative for eye pain and eye discharge Cardiovascular: Negative for chest pain, palpitations, and leg swelling Respiratory: Negative cough, shortness of breath, and wheezing.  Gastrointestinal: Negative for nausea and vomiting. Negative for abdominal pain, diarrhea and constipation.  Genitourinary: Negative for  Dysuria Musculoskeletal: Positive of left hip pain.  Neurological: Negative for dizziness and headache Skin: Negative for rash and itchiness.   Psychiatric: Negative for depression   Past Medical History  Diagnosis Date  . 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 Type 2 diabetes mellitus with  ophthalmic manifestations 04/02/2006    s/p laser surgery for severe diabetic  bilateral non-proliferative retinopathy (2013)    . 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 Hyperlipidemia LDL goal < 100 04/02/2006  . Essential hypertension 12/09/2006  . Coronary artery disease 04/02/2006    s/p 2 stents, specifics unknown   . Benign prostatic hypertrophy with nocturia 07/07/2006  . Chronic venous insufficiency 04/12/2010  . Obesity (BMI 30.0-34.9) 01/15/2012  . Constipation 05/25/2009    Intermittent   . Microcytic normochromic anemia 05/27/2006  . Seasonal allergic rhinitis 05/25/2009  . Degenerative joint disease involving multiple joints 02/02/2007  . Internal and external hemorrhoids without complication 08/20/2012    Past Surgical History  Procedure Laterality Date  . Pilonidal cyst excision    . Eye surgery      Laser  . Fracture surgery      Fractured stump  . Total hip arthroplasty Left 06/14/2014    Procedure: HEMI HIP ARTHROPLASTY ANTERIOR APPROACH;  Surgeon: Tarry Kos, MD;  Location: [facility] OR;  Service: Orthopedics;  Laterality: Left;    History  Substance Use Topics  . Smoking status: Former Smoker    Quit date: 06/16/1968  . Smokeless tobacco: Never Used  . Alcohol Use: No    Family History  Problem Relation Age of Onset  . Hypertension Mother   . Heart attack Father   .  Breast cancer Sister   . Arthritis Sister   . Heart attack Brother   . Diabetes Brother   . Stroke Brother   . Pneumonia Daughter   . Diabetes Brother   . Alcoholism Brother   . Diabetes Brother   . Arthritis Sister     Bilateral knee replacement  . HIV Daughter   . Hypertension Daughter   . Drug abuse Daughter   . Schizophrenia Daughter   . Hypertension Daughter   .  Drug abuse Daughter   . Bipolar disorder Daughter       Medication List       This list is accurate as of: 06/19/14  8:21 PM.  Always use your most recent med list.               amLODipine 10 MG tablet  Commonly known as:  NORVASC  Take 1 tablet (10 mg total) by mouth every morning.     aspirin EC 81 MG tablet  Take 81 mg by mouth every morning.     calcium citrate-vitamin D 315-200 MG-UNIT per tablet  Commonly known as:  CITRACAL+D  Take 1 tablet by mouth daily.     carvedilol 12.5 MG tablet  Commonly known as:  COREG  Take 1 tablet (12.5 mg total) by mouth 2 (two) times daily with a meal.     enoxaparin 30 MG/0.3ML injection  Commonly known as:  LOVENOX  Inject 0.3 mLs (30 mg total) into the skin daily.     fish oil-omega-3 fatty acids 1000 MG capsule  Take 1 g by mouth daily.     fluticasone 50 MCG/ACT nasal spray  Commonly known as:  FLONASE  Place 2 sprays into both nostrils daily as needed for allergies.     furosemide 40 MG tablet  Commonly known as:  LASIX  Take as needed for a weight gain of more than 5 lbs in a week.     hydroxypropyl methylcellulose / hypromellose 2.5 % ophthalmic solution  Commonly known as:  ISOPTO TEARS / GONIOVISC  Place 2 drops into both eyes 4 (four) times daily as needed for dry eyes.     insulin aspart protamine - aspart (70-30) 100 UNIT/ML FlexPen  Commonly known as:  NOVOLOG MIX 70/30 FLEXPEN  Inject 0.3 mLs (30 Units total) into the skin 2 (two) times daily.     Insulin Pen Needle 30G X 8 MM Misc  Commonly known as:  NOVOFINE  Inject SQ as directed twice daily, use new pen needle for each injection     multivitamin with minerals tablet  Take 1 tablet by mouth daily.     oxyCODONE 5 MG immediate release tablet  Commonly known as:  Oxy IR/ROXICODONE  Take 1-3 tablets (5-15 mg total) by mouth every 4 (four) hours as needed.     terazosin 10 MG capsule  Commonly known as:  HYTRIN  Take 10 mg by mouth at bedtime.         Physical Exam  BP 140/74 mmHg  Pulse 90  Temp(Src) 97.3 F (36.3 C)  Resp 16  Ht 5\' 8"  (1.727 m)  Wt 209 lb (94.802 kg)  BMI 31.79 kg/m2  Constitutional: WDWN elderly male in no acute distress. Conversant and pleasant HEENT: Normocephalic and atraumatic. PERRL. EOM intact. No icterus. No nasal discharge or sinus tenderness. Oral mucosa moist. Posterior pharynx clear of any exudate or lesions.  Neck: Supple and nontender. No lymphadenopathy, masses, or thyromegaly. No JVD or carotid  bruits. Cardiac: Normal S1, S2. RRR without appreciable murmurs, rubs, or gallops. Left distal pulse intact with trace pitting edema Lungs: No respiratory distress. Breath sounds clear bilaterally without rales, rhonchi, or wheezes. Abdomen: Audible bowel sounds in all quadrants. Soft, nontender, nondistended. No palpable mass.  Musculoskeletal: able to move all extremities.  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 Left hip tender to palpation. Surgical dressing intact. Limited ROM of LLE with generalized weakness Skin: Warm and dry.  Neurological: Alert and oriented to self. No focal deficits.  Psychiatric: Judgment and insight adequate. Appropriate mood and affect.   Labs Reviewed  CBC Latest Ref Rng 06/17/2014 06/16/2014 06/15/2014  WBC 4.0 - 10.5 K/uL 9.6 10.5 8.9  Hemoglobin 13.0 - 17.0 g/dL 7.4(L) 7.9(L) 8.3(L)  Hematocrit 39.0 - 52.0 % 22.9(L) 24.8(L) 25.5(L)  Platelets 150 - 400 K/uL 106(L) 107(L) 95(L)    CMP Latest Ref Rng 06/16/2014 06/15/2014 06/14/2014  Glucose 70 - 99 mg/dL 161(W) 960(A) 540(J)  BUN 6 - 23 mg/dL 81(X) 91(Y) 78(G)  Creatinine 0.50 - 1.35 mg/dL 9.56(O) 1.30(Q) 6.57(Q)  Sodium 135 - 145 mmol/L 139 139 144  Potassium 3.5 - 5.1 mmol/L 4.2 4.5 5.0  Chloride 96 - 112 mmol/L 113(H) 114(H) 117(H)  CO2 19 - 32 mmol/L Calcium 8.4 - 10.5 mg/dL 7.0(L) 7.3(L) 8.7  Total Protein 6.0 - 8.3 g/dL - - 6.9  Total Bilirubin 0.3 - 1.2 mg/dL - - 0.9  Alkaline Phos 39 - 117 U/L - -  118(H)  AST 0 - 37 U/L - - 60(H)  ALT 0 - 53 U/L - - 52    Lab Results  Component Value Date   HGBA1C 6.3 05/26/2014    Lab Results  Component Value Date   TSH 1.983 02/10/2014    Lipid Panel     Component Value Date/Time   CHOL 69 09/15/2013 1115   TRIG 267* 09/15/2013 1115   HDL 15* 09/15/2013 1115   CHOLHDL 4.6 09/15/2013 1115   VLDL 53* 09/15/2013 1115   LDLCALC 1 09/15/2013 1115   LDLDIRECT 42.1 09/19/2008 0000   Diagnostic Studies Reviewed 06/16/14: CXR  No acute pulmonary abnormalities identified. Gaseous distention of stomach.  06/14/14: Pelvis xray: Well aligned left hip prosthesis.  Assessment & Plan 1. Essential hypertension Stable. Continue coreg 12.5mg  twice daily, norvasc  daily, and terazosin  daily. Continue to monitor   2. Closed displaced fracture of left femoral neck, with routine healing, subsequent encounter S/p left hip hemiarthroplasty. Pain is adequately controlled with current regimen. Continue oxycodone  IR 1-3 tabs every 4 hours as needed for pain. Continue lovenox /0.55mL injection daily for DVT prophylaxis. Continue to work with PT/OT for gait/strength/balance training to restore/maximize function. Continue to f/u wit ortho.   3. Benign prostatic hypertrophy with nocturia Continue terazosin  daily.   4. Coronary artery disease involving native coronary artery of native heart without angina pectoris Denies any chest pain. Continue asa  daily and current BP meds. LDL at goal. Monitor clinically  5. 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 Fall risk precautions.   6. Type 2 diabetes mellitus with complications Last a1c 6.3. Continue novolog 70/30 30 units twice daily. Currently off ACEI secondary to impaired renal function. Continue to monitor cbgs and his status.  7. CKD (chronic kidney disease) stage 3, GFR 30-59 ml/min Avoid nephrotoxic agents, particularly NSAIDs. Continue to  monitor renal function  8. Anemia in chronic kidney disease (CKD) Most likely  in setting of CKD and post-op. Recent hbg 7.4. Continue to monitor h&h   Diagnostic Studies/Labs Ordered: cbc w/ diff, cmp  Time spent: 40 minutes on care coordination   Family/Staff Communication Plan of care discussed with patient and nursing staff. Patient and nursing staff verbalized understanding and agree with plan of care. No additional questions or concerns reported.    Loura BackKim Karmah Potocki, MSN, AGNP-C [facility] [address] [place], [place] [address] [phone] [8am-5pm] After hours: [phone]

## 2014-06-20 ENCOUNTER — Other Ambulatory Visit: Payer: Self-pay | Admitting: *Deleted

## 2014-06-20 DIAGNOSIS — N183 Chronic kidney disease, stage 3 unspecified: Secondary | ICD-10-CM

## 2014-06-20 DIAGNOSIS — E1122 Type 2 diabetes mellitus with diabetic chronic kidney disease: Secondary | ICD-10-CM

## 2014-06-20 MED ORDER — OXYCODONE HCL 5 MG PO TABS: ORAL_TABLET | ORAL | Status: DC

## 2014-06-21 ENCOUNTER — Encounter: Payer: Self-pay | Admitting: Registered Nurse

## 2014-06-21 ENCOUNTER — Non-Acute Institutional Stay (SKILLED_NURSING_FACILITY): Payer: Medicare PPO | Admitting: Registered Nurse

## 2014-06-21 ENCOUNTER — Encounter (HOSPITAL_COMMUNITY): Payer: Self-pay | Admitting: Emergency Medicine

## 2014-06-21 ENCOUNTER — Observation Stay (HOSPITAL_COMMUNITY)
Admission: EM | Admit: 2014-06-21 | Discharge: 2014-06-23 | Disposition: A | Discharge status: Skilled Nursing Facility | Payer: Medicare PPO | Attending: Internal Medicine | Admitting: Internal Medicine

## 2014-06-21 DIAGNOSIS — N183 Chronic kidney disease, stage 3 (moderate): Secondary | ICD-10-CM | POA: Diagnosis not present

## 2014-06-21 DIAGNOSIS — E1151 Type 2 diabetes mellitus with diabetic peripheral angiopathy without gangrene: Secondary | ICD-10-CM | POA: Diagnosis not present

## 2014-06-21 DIAGNOSIS — E11329 Type 2 diabetes mellitus with mild nonproliferative diabetic retinopathy without macular edema: Secondary | ICD-10-CM | POA: Diagnosis not present

## 2014-06-21 DIAGNOSIS — Z6830 Body mass index (BMI) 30.0-30.9, adult: Secondary | ICD-10-CM | POA: Diagnosis not present

## 2014-06-21 DIAGNOSIS — Z955 Presence of coronary angioplasty implant and graft: Secondary | ICD-10-CM | POA: Insufficient documentation

## 2014-06-21 DIAGNOSIS — E669 Obesity, unspecified: Secondary | ICD-10-CM | POA: Diagnosis not present

## 2014-06-21 DIAGNOSIS — M159 Polyosteoarthritis, unspecified: Secondary | ICD-10-CM | POA: Diagnosis not present

## 2014-06-21 DIAGNOSIS — Z7901 Long term (current) use of anticoagulants: Secondary | ICD-10-CM | POA: Insufficient documentation

## 2014-06-21 DIAGNOSIS — K922 Gastrointestinal hemorrhage, unspecified: Secondary | ICD-10-CM | POA: Diagnosis present

## 2014-06-21 DIAGNOSIS — Z7982 Long term (current) use of aspirin: Secondary | ICD-10-CM | POA: Insufficient documentation

## 2014-06-21 DIAGNOSIS — N400 Enlarged prostate without lower urinary tract symptoms: Secondary | ICD-10-CM | POA: Insufficient documentation

## 2014-06-21 DIAGNOSIS — I129 Hypertensive chronic kidney disease with stage 1 through stage 4 chronic kidney disease, or unspecified chronic kidney disease: Secondary | ICD-10-CM | POA: Insufficient documentation

## 2014-06-21 DIAGNOSIS — I2510 Atherosclerotic heart disease of native coronary artery without angina pectoris: Secondary | ICD-10-CM | POA: Insufficient documentation

## 2014-06-21 DIAGNOSIS — E785 Hyperlipidemia, unspecified: Secondary | ICD-10-CM | POA: Diagnosis not present

## 2014-06-21 DIAGNOSIS — Z96642 Presence of left artificial hip joint: Secondary | ICD-10-CM | POA: Insufficient documentation

## 2014-06-21 DIAGNOSIS — I10 Essential (primary) hypertension: Secondary | ICD-10-CM | POA: Diagnosis present

## 2014-06-21 DIAGNOSIS — E1140 Type 2 diabetes mellitus with diabetic neuropathy, unspecified: Secondary | ICD-10-CM | POA: Insufficient documentation

## 2014-06-21 DIAGNOSIS — Z87891 Personal history of nicotine dependence: Secondary | ICD-10-CM | POA: Insufficient documentation

## 2014-06-21 DIAGNOSIS — R52 Pain, unspecified: Secondary | ICD-10-CM

## 2014-06-21 DIAGNOSIS — Z794 Long term (current) use of insulin: Secondary | ICD-10-CM | POA: Insufficient documentation

## 2014-06-21 DIAGNOSIS — N186 End stage renal disease: Secondary | ICD-10-CM

## 2014-06-21 DIAGNOSIS — I25118 Atherosclerotic heart disease of native coronary artery with other forms of angina pectoris: Secondary | ICD-10-CM | POA: Diagnosis present

## 2014-06-21 DIAGNOSIS — D649 Anemia, unspecified: Secondary | ICD-10-CM | POA: Insufficient documentation

## 2014-06-21 DIAGNOSIS — D509 Iron deficiency anemia, unspecified: Secondary | ICD-10-CM | POA: Diagnosis not present

## 2014-06-21 DIAGNOSIS — E1122 Type 2 diabetes mellitus with diabetic chronic kidney disease: Secondary | ICD-10-CM | POA: Diagnosis not present

## 2014-06-21 DIAGNOSIS — Z96649 Presence of unspecified artificial hip joint: Secondary | ICD-10-CM

## 2014-06-21 LAB — COMPREHENSIVE METABOLIC PANEL
ALT: 23 U/L (ref 0–53)
AST: 31 U/L (ref 0–37)
Albumin: 2.1 g/dL — ABNORMAL LOW (ref 3.5–5.2)
Alkaline Phosphatase: 85 U/L (ref 39–117)
Anion gap: 6 (ref 5–15)
BUN: 63 mg/dL — ABNORMAL HIGH (ref 6–23)
CO2: 17 mmol/L — ABNORMAL LOW (ref 19–32)
Calcium: 7.7 mg/dL — ABNORMAL LOW (ref 8.4–10.5)
Chloride: 115 mmol/L — ABNORMAL HIGH (ref 96–112)
Creatinine, Ser: 2.27 mg/dL — ABNORMAL HIGH (ref 0.50–1.35)
GFR calc Af Amer: 32 mL/min — ABNORMAL LOW (ref >90)
GFR calc non Af Amer: 27 mL/min — ABNORMAL LOW (ref >90)
Glucose, Bld: 87 mg/dL (ref 70–99)
Potassium: 4.3 mmol/L (ref 3.5–5.1)
Sodium: 138 mmol/L (ref 135–145)
Total Bilirubin: 0.5 mg/dL (ref 0.3–1.2)
Total Protein: 5.9 g/dL — ABNORMAL LOW (ref 6.0–8.3)

## 2014-06-21 LAB — CBC WITH DIFFERENTIAL/PLATELET
Basophils Absolute: 0 10*3/uL (ref 0.0–0.1)
Basophils Relative: 0 % (ref 0–1)
Eosinophils Absolute: 0.1 10*3/uL (ref 0.0–0.7)
Eosinophils Relative: 2 % (ref 0–5)
HCT: 20.2 % — ABNORMAL LOW (ref 39.0–52.0)
Hemoglobin: 6.7 g/dL — CL (ref 13.0–17.0)
Lymphocytes Relative: 16 % (ref 12–46)
Lymphs Abs: 1.5 10*3/uL (ref 0.7–4.0)
MCH: 24.1 pg — ABNORMAL LOW (ref 26.0–34.0)
MCHC: 33.2 g/dL (ref 30.0–36.0)
MCV: 72.7 fL — ABNORMAL LOW (ref 78.0–100.0)
Monocytes Absolute: 0.7 10*3/uL (ref 0.1–1.0)
Monocytes Relative: 8 % (ref 3–12)
Neutro Abs: 6.9 10*3/uL (ref 1.7–7.7)
Neutrophils Relative %: 74 % (ref 43–77)
Platelets: 202 10*3/uL (ref 150–400)
RBC: 2.78 MIL/uL — ABNORMAL LOW (ref 4.22–5.81)
RDW: 14 % (ref 11.5–15.5)
WBC: 9.3 10*3/uL (ref 4.0–10.5)

## 2014-06-21 LAB — CBC AND DIFFERENTIAL
HEMATOCRIT: 20 % — AB (ref 41–53)
Hemoglobin: 6.6 g/dL — AB (ref 13.5–17.5)
Platelets: 203 10*3/uL (ref 150–399)
WBC: 8 10*3/mL

## 2014-06-21 LAB — PREPARE RBC (CROSSMATCH)

## 2014-06-21 LAB — ABO/RH: ABO/RH(D): O POS

## 2014-06-21 LAB — GLUCOSE, CAPILLARY
GLUCOSE-CAPILLARY: 108 mg/dL — AB (ref 70–99)
Glucose-Capillary: 159 mg/dL — ABNORMAL HIGH (ref 70–99)

## 2014-06-21 LAB — TROPONIN I: Troponin I: 0.03 ng/mL (ref <0.031)

## 2014-06-21 LAB — LIPASE, BLOOD: Lipase: 31 U/L (ref 11–59)

## 2014-06-21 LAB — MRSA PCR SCREENING: MRSA by PCR: NEGATIVE

## 2014-06-21 MED ORDER — CARVEDILOL 12.5 MG PO TABS
12.5000 mg | ORAL_TABLET | Freq: Two times a day (BID) | ORAL | Status: DC
  Administered 2014-06-21 – 2014-06-23 (×4): 12.5 mg via ORAL
  Filled 2014-06-21 (×6): qty 1

## 2014-06-21 MED ORDER — CALCIUM CITRATE-VITAMIN D 315-200 MG-UNIT PO TABS: 1.0000 | ORAL_TABLET | Freq: Every day | ORAL | Status: DC

## 2014-06-21 MED ORDER — FUROSEMIDE 40 MG PO TABS
40.0000 mg | ORAL_TABLET | Freq: Every day | ORAL | Status: DC
  Administered 2014-06-21 – 2014-06-23 (×3): 40 mg via ORAL
  Filled 2014-06-21 (×3): qty 1

## 2014-06-21 MED ORDER — CALCIUM CARBONATE-VITAMIN D 500-200 MG-UNIT PO TABS
1.0000 | ORAL_TABLET | Freq: Every day | ORAL | Status: DC
  Administered 2014-06-22 – 2014-06-23 (×2): 1 via ORAL
  Filled 2014-06-21 (×3): qty 1

## 2014-06-21 MED ORDER — ACETAMINOPHEN 325 MG PO TABS
650.0000 mg | ORAL_TABLET | Freq: Once | ORAL | Status: AC
  Administered 2014-06-21: 650 mg via ORAL
  Filled 2014-06-21: qty 2

## 2014-06-21 MED ORDER — ACETAMINOPHEN 650 MG RE SUPP: 650.0000 mg | Freq: Four times a day (QID) | RECTAL | Status: DC | PRN

## 2014-06-21 MED ORDER — POLYVINYL ALCOHOL 1.4 % OP SOLN
2.0000 [drp] | Freq: Four times a day (QID) | OPHTHALMIC | Status: DC | PRN
  Filled 2014-06-21: qty 15

## 2014-06-21 MED ORDER — ENOXAPARIN SODIUM 40 MG/0.4ML ~~LOC~~ SOLN
40.0000 mg | SUBCUTANEOUS | Status: DC
  Filled 2014-06-21: qty 0.4

## 2014-06-21 MED ORDER — ENOXAPARIN SODIUM 30 MG/0.3ML ~~LOC~~ SOLN
30.0000 mg | SUBCUTANEOUS | Status: DC
Start: 2014-06-22 — End: 2014-06-23
  Administered 2014-06-22 – 2014-06-23 (×2): 30 mg via SUBCUTANEOUS
  Filled 2014-06-21 (×3): qty 0.3

## 2014-06-21 MED ORDER — HYPROMELLOSE (GONIOSCOPIC) 2.5 % OP SOLN: 2.0000 [drp] | Freq: Four times a day (QID) | OPHTHALMIC | Status: DC | PRN

## 2014-06-21 MED ORDER — DOCUSATE SODIUM 100 MG PO CAPS
100.0000 mg | ORAL_CAPSULE | Freq: Two times a day (BID) | ORAL | Status: DC
  Administered 2014-06-21 – 2014-06-23 (×4): 100 mg via ORAL
  Filled 2014-06-21 (×5): qty 1

## 2014-06-21 MED ORDER — TERAZOSIN HCL 5 MG PO CAPS
10.0000 mg | ORAL_CAPSULE | Freq: Every day | ORAL | Status: DC
Start: 2014-06-21 — End: 2014-06-23
  Administered 2014-06-21 – 2014-06-22 (×2): 10 mg via ORAL
  Filled 2014-06-21 (×3): qty 2

## 2014-06-21 MED ORDER — SODIUM CHLORIDE 0.9 % IV SOLN
10.0000 mL/h | Freq: Once | INTRAVENOUS | Status: AC
  Administered 2014-06-21: 10 mL/h via INTRAVENOUS

## 2014-06-21 MED ORDER — ASPIRIN EC 81 MG PO TBEC
81.0000 mg | DELAYED_RELEASE_TABLET | Freq: Every morning | ORAL | Status: DC
  Administered 2014-06-22: 81 mg via ORAL
  Filled 2014-06-21: qty 1

## 2014-06-21 MED ORDER — INSULIN ASPART 100 UNIT/ML ~~LOC~~ SOLN
0.0000 [IU] | Freq: Three times a day (TID) | SUBCUTANEOUS | Status: DC
Start: 2014-06-22 — End: 2014-06-23
  Administered 2014-06-22: 3 [IU] via SUBCUTANEOUS
  Administered 2014-06-22: 5 [IU] via SUBCUTANEOUS
  Administered 2014-06-22: 3 [IU] via SUBCUTANEOUS
  Administered 2014-06-23: 5 [IU] via SUBCUTANEOUS
  Administered 2014-06-23: 3 [IU] via SUBCUTANEOUS

## 2014-06-21 MED ORDER — AMLODIPINE BESYLATE 10 MG PO TABS
10.0000 mg | ORAL_TABLET | Freq: Every morning | ORAL | Status: DC
  Administered 2014-06-22 – 2014-06-23 (×2): 10 mg via ORAL
  Filled 2014-06-21 (×2): qty 1

## 2014-06-21 MED ORDER — FLUTICASONE PROPIONATE 50 MCG/ACT NA SUSP
2.0000 | Freq: Every day | NASAL | Status: DC | PRN
  Filled 2014-06-21: qty 16

## 2014-06-21 MED ORDER — ACETAMINOPHEN 325 MG PO TABS
650.0000 mg | ORAL_TABLET | Freq: Four times a day (QID) | ORAL | Status: DC | PRN
  Administered 2014-06-22: 650 mg via ORAL
  Filled 2014-06-21: qty 2

## 2014-06-21 NOTE — ED Notes (Signed)
[facility] for Hbg 6.6.

## 2014-06-21 NOTE — ED Notes (Signed)
Patient comes from [facility] with c/o anemia after [facility] revealed his Hgb to be 6.6.  Patient was directed here for blood transfusion.  Patient is @ 7 days s/p left hip replacement.  Patient is a right BKA.  Patient is moving LLE well, +2 pulses.  Skin warm/dry.  No edema noted.  Patient denies SOB, but is noticeably dyspneic when talking at length.  Patient's lung sounds are clear to auscultation.  +3 radial pulses.  PERRL 3mm.  Patient's abdomen is soft and non-tender to palpation.  Bowel sounds present.
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
## 2014-06-21 NOTE — Progress Notes (Signed)
ANTICOAGULATION CONSULT NOTE - Initial Consult  Pharmacy Consult for Lovenox Indication: VTE prophylaxis  Allergies  Allergen Reactions  . Amoxicillin Swelling and Other (See Comments)    Puffy eyes and abdominal pain with Amoxicillin PO  . Pravastatin Other (See Comments)    Weakness and fatigue  . Tamsulosin Other (See Comments)    REACTION: dry throat, sweating, blurred vision  . Doxycycline Rash and Other (See Comments)    Questionable drug rxn rash    Patient Measurements:    Vital Signs: Temp: 98.9 F (37.2 C) (03/30 1646) Temp Source: Oral (03/30 1646) BP: 132/74 mmHg (03/30 1646) Pulse Rate: 75 (03/30 1646)  Labs:  Recent Labs  06/21/14 06/21/14 1224  HGB 6.6* 6.7*  HCT 20* 20.2*  PLT 203 202  CREATININE  --  2.27*    Estimated Creatinine Clearance: 33.4 mL/min (by C-G formula based on Cr of 2.27).   Medical History: Past Medical History  Diagnosis Date  . 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 Hyperlipidemia LDL goal < 100 04/02/2006  . Essential hypertension 12/09/2006  . Coronary artery disease 04/02/2006    s/p 2 stents, specifics unknown   . Benign prostatic hypertrophy with nocturia 07/07/2006  . Chronic venous insufficiency 04/12/2010  . Obesity (BMI 30.0-34.9) 01/15/2012  . Constipation 05/25/2009    Intermittent   . Microcytic normochromic anemia 05/27/2006  . Seasonal allergic rhinitis 05/25/2009  . Degenerative joint disease involving multiple joints 02/02/2007   . Internal and external hemorrhoids without complication 08/20/2012    Medications:  Prescriptions prior to admission  Medication Sig Dispense Refill Last Dose  . amLODipine (NORVASC) 10 MG tablet Take 1 tablet (10 mg total) by mouth every morning. 30 tablet 11 06/21/2014 at Unknown time  . aspirin EC 81 MG tablet Take 81 mg by mouth every morning.   06/21/2014 at Unknown time  . calcium citrate-vitamin D (CITRACAL+D) 315-200 MG-UNIT per tablet Take 1 tablet by mouth daily.    06/21/2014 at Unknown time  . carvedilol (COREG) 12.5 MG tablet Take 1 tablet (12.5 mg total) by mouth 2 (two) times daily with a meal. 180 tablet 3 06/21/2014 at 0900  . enoxaparin (LOVENOX) 30 MG/0.3ML injection Inject 0.3 mLs (30 mg total) into the skin daily. 28 Syringe 0 06/21/2014 at 0900  . fish oil-omega-3 fatty acids 1000 MG capsule Take 1 g by mouth daily.   06/21/2014 at Unknown time  . fluticasone (FLONASE) 50 MCG/ACT nasal spray Place 2 sprays into both nostrils daily as needed for allergies. 16 g 11 unknown  . furosemide (LASIX) 40 MG tablet Take as needed for a weight gain of more than 5 lbs in a week. 90 tablet 3 unknown  . hydroxypropyl methylcellulose (ISOPTO TEARS) 2.5 % ophthalmic solution Place 2 drops into both eyes 4 (four) times daily as needed for dry eyes.   unknown  . insulin aspart protamine - aspart (NOVOLOG MIX 70/30 FLEXPEN) (70-30) 100 UNIT/ML FlexPen Inject 0.3 mLs (30 Units total) into the  skin 2 (two) times daily. 18 pen 3 06/21/2014 at Unknown time  . Insulin Pen Needle (NOVOFINE) 30G X 8 MM MISC Inject SQ as directed twice daily, use new pen needle for each injection 2 each 11 06/20/2014 at Unknown time  . Multiple Vitamins-Minerals (MULTIVITAMIN WITH MINERALS) tablet Take 1 tablet by mouth daily.   06/20/2014 at Unknown time  . oxyCODONE (OXY IR/ROXICODONE) 5 MG immediate release tablet Take 1 tablet by mouth every 4 hours as needed for mild to moderate pain: Take 2 tablets =10 mg by mouth every  4 hours as needed for moderate to severe pain : Take 1 tablet by mouth every 4 hours as needed for severe/breakthrough pain. 240 tablet 0 06/21/2014 at 0900  . terazosin (HYTRIN) 10 MG capsule Take 10 mg by mouth at bedtime.   06/20/2014 at Unknown time    Assessment: 69 y.o. male presents from NH with Hgb 6.6. Pt receiving transfusion. Pt is s/p hip fx repair 3/23 and on Lovenox  SQ daily PTA - last dose 3/30 at 0900. To continue Lovenox for VTE prophylaxis. SCr 2.27, est CrCl 30 ml/min. BMI 31.   Goal of Therapy:  Prevention of VTE Monitor platelets by anticoagulation protocol: Yes   Plan:  Lovenox  SQ q24h F/u CBC and renal function closely  Christoper Fabian, PharmD, BCPS Clinical pharmacist, pager [phone] 06/21/2014,6:47 PM

## 2014-06-21 NOTE — ED Provider Notes (Signed)
CSN: [id number]     Arrival date & time 06/21/14  1206 History   First MD Initiated Contact with Patient 06/21/14 1236     No chief complaint on file.    (Consider location/radiation/quality/duration/timing/severity/associated sxs/prior Treatment) HPI   69 year old male sent for evaluation of anemia. Patient has become increasingly anemic since left hip surgery on March 23. Repeat hemoglobin today 6.7. He has been on prophylactic dosing of Lovenox postop. He feels tired. No shortness of breath. Past Medical History  Diagnosis Date  . 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 Hyperlipidemia LDL goal < 100 04/02/2006  . Essential hypertension 12/09/2006  . Coronary artery disease 04/02/2006    s/p 2 stents, specifics unknown   . Benign prostatic hypertrophy with nocturia 07/07/2006  . Chronic venous insufficiency 04/12/2010  . Obesity (BMI 30.0-34.9) 01/15/2012  . Constipation 05/25/2009    Intermittent   . Microcytic normochromic anemia 05/27/2006  . Seasonal allergic rhinitis 05/25/2009  . Degenerative joint disease involving multiple joints 02/02/2007  . Internal and external hemorrhoids without complication 08/20/2012   Past Surgical History  Procedure Laterality Date  . Pilonidal cyst excision    . Eye surgery      Laser  . Fracture surgery      Fractured stump  . Total hip arthroplasty Left 06/14/2014    Procedure: HEMI HIP ARTHROPLASTY ANTERIOR APPROACH;  Surgeon: Tarry Kos, MD;  Location: [facility] OR;   Service: Orthopedics;  Laterality: Left;   Family History  Problem Relation Age of Onset  . Hypertension Mother   . Heart attack Father   . Breast cancer Sister   . Arthritis Sister   . Heart attack Brother   . Diabetes Brother   . Stroke Brother   . Pneumonia Daughter   . Diabetes Brother   . Alcoholism Brother   . Diabetes Brother   . Arthritis Sister     Bilateral knee replacement  . HIV Daughter   . Hypertension Daughter   . Drug abuse Daughter   . Schizophrenia Daughter   . Hypertension Daughter   . Drug abuse Daughter   . Bipolar disorder Daughter    History  Substance Use Topics  . Smoking status: Former Smoker    Quit date: 06/16/1968  . Smokeless tobacco: Never Used  . Alcohol Use: No    Review of Systems  All systems reviewed and negative, other than as noted in HPI.   Allergies  Amoxicillin; Pravastatin; Tamsulosin; and Doxycycline  Home Medications   Prior to Admission medications   Medication Sig Start Date End Date Taking? Authorizing Provider  amLODipine (NORVASC) 10 MG tablet Take 1 tablet (10 mg total) by mouth every morning. 12/29/13  Yes Burns Spain, MD  aspirin EC 81 MG tablet Take 81 mg by mouth every morning.   Yes Historical Provider, MD  calcium citrate-vitamin D (CITRACAL+D) 315-200 MG-UNIT per tablet Take 1 tablet by mouth daily.    Yes Historical Provider, MD  carvedilol (COREG) 12.5 MG  tablet Take 1 tablet (12.5 mg total) by mouth 2 (two) times daily with a meal. 01/05/14  Yes Doneen Poisson, MD  enoxaparin (LOVENOX) 30 MG/0.3ML injection Inject 0.3 mLs (30 mg total) into the skin daily. 06/14/14  Yes Tarry Kos, MD  fish oil-omega-3 fatty acids 1000 MG capsule Take 1 g by mouth daily. 01/15/12  Yes Doneen Poisson, MD  fluticasone [facility]) 50 MCG/ACT nasal spray Place 2 sprays into both nostrils daily as needed for allergies. 12/29/13  Yes Burns Spain, MD  furosemide (LASIX) 40 MG tablet Take as needed for a weight gain  of more than 5 lbs in a week. 02/10/14  Yes Doneen Poisson, MD  hydroxypropyl methylcellulose (ISOPTO TEARS) 2.5 % ophthalmic solution Place 2 drops into both eyes 4 (four) times daily as needed for dry eyes.   Yes Historical Provider, MD  insulin aspart protamine - aspart (NOVOLOG MIX 70/30 FLEXPEN) (70-30) 100 UNIT/ML FlexPen Inject 0.3 mLs (30 Units total) into the skin 2 (two) times daily. 05/16/14  Yes Doneen Poisson, MD  Insulin Pen Needle (NOVOFINE) 30G X 8 MM MISC Inject SQ as directed twice daily, use new pen needle for each injection 09/10/10  Yes Edsel Petrin, DO  Multiple Vitamins-Minerals (MULTIVITAMIN WITH MINERALS) tablet Take 1 tablet by mouth daily.   Yes Historical Provider, MD  oxyCODONE (OXY IR/ROXICODONE) 5 MG immediate release tablet Take 1 tablet by mouth every 4 hours as needed for mild to moderate pain: Take 2 tablets =10 mg by mouth every 4 hours as needed for moderate to severe pain : Take 1 tablet by mouth every 4 hours as needed for severe/breakthrough pain. 06/20/14  Yes Kimber Relic, MD  terazosin (HYTRIN) 10 MG capsule Take 10 mg by mouth at bedtime.   Yes Historical Provider, MD   BP 116/72 mmHg  Pulse 83  Temp(Src) 99.1 F (37.3 C) (Oral)  Resp 18  SpO2 97% Physical Exam  Constitutional: He appears well-developed and well-nourished. No distress.  HENT:  Head: Normocephalic and atraumatic.  Eyes: Conjunctivae are normal. Right eye exhibits no discharge. Left eye exhibits no discharge.  Neck: Neck supple.  Cardiovascular: Normal rate, regular rhythm and normal heart sounds.  Exam reveals no gallop and no friction rub.   No murmur heard. Pulmonary/Chest: Effort normal and breath sounds normal. No respiratory distress.  Abdominal: Soft. He exhibits no distension. There is no tenderness.  Musculoskeletal: He exhibits no edema or tenderness.  Right BKA. Surgical site left hip with dry bandages over top. No surrounding concerning skin changes.  Neurological:  He is alert.  Skin: Skin is warm and dry.  Psychiatric: He has a normal mood and affect. His behavior is normal. Thought content normal.  Nursing note and vitals reviewed.   ED Course  Procedures (including critical care time)  CRITICAL CARE Performed by: Raeford Razor Total critical care time: 35  minutes Critical care time was exclusive of separately billable procedures and treating other patients. Critical care was necessary to treat or prevent imminent or life-threatening deterioration. Critical care was time spent personally by me on the following activities: development of treatment plan with patient and/or surrogate as well as nursing, discussions with consultants, evaluation of patient's response to treatment, examination of patient, obtaining history from patient or surrogate, ordering and performing treatments and interventions, ordering and review of laboratory studies, ordering and review of radiographic studies, pulse oximetry and re-evaluation of patient's condition.  Labs Review Labs Reviewed  CBC WITH DIFFERENTIAL/PLATELET -  Abnormal; Notable for the following:    RBC 2.78 (*)    Hemoglobin 6.7 (*)    HCT 20.2 (*)    MCV 72.7 (*)    MCH 24.1 (*)    All other components within normal limits  COMPREHENSIVE METABOLIC PANEL - Abnormal; Notable for the following:    Chloride 115 (*)    CO2 17 (*)    BUN 63 (*)    Creatinine, Ser 2.27 (*)    Calcium 7.7 (*)    Total Protein 5.9 (*)    Albumin 2.1 (*)    GFR calc non Af Amer 27 (*)    GFR calc Af Amer 32 (*)    All other components within normal limits  LIPASE, BLOOD  TYPE AND SCREEN  ABO/RH  PREPARE RBC (CROSSMATCH)    Imaging Review No results found.   EKG Interpretation None      MDM   Final diagnoses:  Hip joint replacement status  Anemia, unspecified anemia type    69yM with with anemia. Post-op L femoral neck fracture and surgery 3/23. Brown, but heme + stool. On prophylactic dosing lovenox.  Will transfuse. Will discuss with medicine for admission. [facility]    Raeford RazorStephen Milah Recht, MD 06/23/14 [phone]

## 2014-06-21 NOTE — ED Notes (Signed)
Transfusion began.

## 2014-06-21 NOTE — ED Notes (Signed)
Informed MD/RN of abnormal lab

## 2014-06-21 NOTE — H&P (Signed)
Date: 06/21/2014               Patient Name:  [name] [name] MRN: [id number]  DOB: January 07, 1946 Age / Sex: 69 y.o., male   PCP: [name] Poisson, MD         Medical Service: Internal Medicine Teaching Service         Attending Physician: Dr. Aletta Edouard, MD    First Contact: Dr. Gara [name] Pager: [phone]  Second Contact: Dr. 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 Pt was last admitted on 06/13/14 for left hip fracture and had left hip hemi arthroplasty on 323/16. He was then discharged to SNF on renally dosed lovenox and asa  and was found to have a hgb of 6.6 today. Pt denies BRBPR or dark stools. Denies SOB, CP, dizziness, light headedness, DOE, abdominal pain, blurry vision, diaphoresis, HA, pallor, weakness or any pain. Left hip surgery pain has been well controlled with percocet at SNF. Pt had a colonoscopy in11/29/2007 notable for 5 mm sessile polyp in the rectum without signs of malignancy per biopsy along with internal and external hemorrhoids. He is due for a colonoscopy next year. Pt was seen at [facility] ED and transfused 1 U RBCs. FOBT positive on their exam.     Meds: Current Facility-Administered Medications  Medication Dose Route Frequency Provider Last Rate Last Dose  . acetaminophen (TYLENOL) tablet 650 mg  650 mg Oral Q6H PRN [name] Brick, MD       Or  . acetaminophen (TYLENOL) suppository 650 mg  650 mg Rectal Q6H PRN [name] Brick, MD      . Melene Muller ON 06/22/2014] amLODipine (NORVASC) tablet 10 mg  10 mg Oral q morning - 10a [name] Brick, MD      . Melene Muller  ON 06/22/2014] calcium-vitamin D (OSCAL WITH D) 500-200 MG-UNIT per tablet 1 tablet  1 tablet Oral Q breakfast [name] Edouard, MD      . carvedilol (COREG) tablet 12.5 mg  12.5 mg Oral BID WC [name] Brick, MD      . docusate sodium (COLACE) capsule 100 mg  100 mg Oral BID [name] Brick, MD      . fluticasone [facility]) 50 MCG/ACT nasal spray 2 spray  2 spray Each Nare Daily PRN [name] Brick, MD      . furosemide (LASIX) tablet 40 mg  40 mg Oral Daily [name] Brick, MD      . polyvinyl alcohol (LIQUIFILM TEARS) 1.4 % ophthalmic solution 2 drop  2 drop Both Eyes QID PRN [name] Edouard, MD      . terazosin (HYTRIN) capsule 10 mg  10 mg Oral QHS [name] Brick, MD        Allergies: Allergies as of 06/21/2014 - Review Complete 06/21/2014  Allergen Reaction Noted  . Amoxicillin Swelling and Other (See Comments) 04/21/2012  . Pravastatin Other (See Comments) 05/26/2014  . Tamsulosin Other (See Comments)   . Doxycycline Rash and Other (See Comments) 12/02/2011   Past Medical History  Diagnosis Date  . Type 2 diabetes mellitus with neurological manifestations  04/02/2006    Neuropathy of the left foot   . 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 Hyperlipidemia LDL goal < 100 04/02/2006  . Essential hypertension 12/09/2006  . Coronary artery disease 04/02/2006    s/p 2 stents, specifics unknown   . Benign prostatic hypertrophy with nocturia 07/07/2006  . Chronic venous insufficiency 04/12/2010  . Obesity (BMI 30.0-34.9) 01/15/2012  . Constipation 05/25/2009    Intermittent   . Microcytic normochromic anemia  05/27/2006  . Seasonal allergic rhinitis 05/25/2009  . Degenerative joint disease involving multiple joints 02/02/2007  . Internal and external hemorrhoids without complication 08/20/2012   Past Surgical History  Procedure Laterality Date  . Pilonidal cyst excision    . Eye surgery      Laser  . Fracture surgery      Fractured stump  . Total hip arthroplasty Left 06/14/2014    Procedure: HEMI HIP ARTHROPLASTY ANTERIOR APPROACH;  Surgeon: [name] KosNaiping M Xu, MD;  Location: [facility] OR;  Service: Orthopedics;  Laterality: Left;   Family History  Problem Relation Age of Onset  . Hypertension Mother   . Heart attack Father   . Breast cancer Sister   . Arthritis Sister   . Heart attack Brother   . Diabetes Brother   . Stroke Brother   . Pneumonia Daughter   . Diabetes Brother   . Alcoholism Brother   . Diabetes Brother   . Arthritis Sister     Bilateral knee replacement  . HIV Daughter   . Hypertension Daughter   . Drug abuse Daughter   . Schizophrenia Daughter   . Hypertension Daughter   . Drug abuse Daughter   . Bipolar disorder Daughter    History   Social History  . Marital Status: Married    Spouse Name: N/A  . Number of Children: N/A  . Years of Education: N/A   Occupational History  . Not on file.   Social History Main Topics  . Smoking status: Former Smoker    Quit date: 06/16/1968  . Smokeless tobacco: Never Used  . Alcohol Use: No  . Drug Use: No  . Sexual Activity: Yes    Birth Control/ Protection: None   Other Topics Concern  . Not on file   Social History Narrative    Review of Systems: Pertinent items are noted in HPI.  Physical Exam: Blood pressure 132/74, pulse 75, temperature 98.9 F (37.2 C), temperature source Oral, resp. rate 18, SpO2 99 %. General: NAD, laying in bed comfortably HEENT: moist mucous membranes, neg for conjunctival pallor Lungs: CTAB, no wheezing Cardiac: RRR, no murmurs GI: soft, active bowel sounds Neuro: CN II-XII grossly  intact Ext: left hip wound staples intact, neg for bleeding  Lab results: Basic Metabolic Panel:  Recent Labs  32/35/5703/30/16 1224  NA 138  K 4.3  CL 115*  CO2 17*  GLUCOSE 87  BUN 63*  CREATININE 2.27*  CALCIUM 7.7*   Liver Function Tests:  Recent Labs  06/21/14 1224  AST 31  ALT 23  ALKPHOS 85  BILITOT 0.5  PROT 5.9*  ALBUMIN 2.1*  Recent Labs  06/21/14 1224  LIPASE 31   CBC:  Recent Labs  06/21/14 06/21/14 1224  WBC 8.0 9.3  NEUTROABS  --  6.9  HGB 6.6* 6.7*  HCT 20* 20.2*  MCV  --  72.7*  PLT 203 202   CBG:  Recent Labs  06/21/14 1733  GLUCAP 108*    Assessment & Plan by Problem: Active Problems:   Essential hypertension   Coronary artery disease involving native heart without angina pectoris   Type 2 diabetes mellitus with stage 3 chronic kidney disease   Hip joint replacement status   Anemia   GI bleed   GI bleed-- Pt has been on renally dosed lovenox since discharge. Likely pt experiencing slow GI bleed as he feels completely asymptomatic versus equilibration of hemoglobin status post acute blood loss for sx on 3/23. Hgb was 7.4 on discharge on 3/26. Vitals stable. Transfused 1 unit RBC while at [facility].  - admit to tele - transfuse 1 more unit of RBC, check post transfusion CBC - EKG - troponin ordered - will need colonoscopy done as outpatient  Type 2 diabetes complicated by retinopathy, peripheral vascular disease: A1c 6.3, 05/26/2014.  Home insulin regimen is NovoLog 70/3030 units twice daily. -moderate sliding scale insulin  Chronic kidney disease stage III: Creatinine 2.27 on admission, baseline 1.5-1.9.  - will continue to monitor  Hypertension: Home medications include amlodipine 10 mg, Coreg 12.5 mg twice daily, and terazosin 10 mg at bedtime -Continue cont home meds   Colonoscopy 02/19/2006 notable for 5 mm sessile polyp in the rectum without signs of malignancy per biopsy along with internal and external hemorrhoids.    Coronary artery disease:cont aspirin 81.  Benign prostatic hypertrophy with nocturia: Terazosin as noted above.  DVT ppx-- renally dose lovenox per pharm. Per Dr. Cyndie Chime risk of DVT outweighs risk of GI bleed.   Dispo: Disposition is deferred at this time, awaiting improvement of current medical problems.   The patient does have a current PCP [name] Poisson, MD) and does need an [facility] hospital follow-up appointment after discharge.  The patient does not have transportation limitations that hinder transportation to clinic appointments.  Signed: Denton Brick, MD 06/21/2014, 6:07 PM

## 2014-06-21 NOTE — ED Notes (Signed)
Bed: ZO10WA12 Expected date:  Expected time:  Means of arrival:  Comments: EMS-low hemoglobin

## 2014-06-21 NOTE — ED Notes (Signed)
Patient's temp 99.5.  EDP [place] notified.  Order for PO Tylenol obtained.

## 2014-06-21 NOTE — Progress Notes (Signed)
NURSING PROGRESS NOTE  Derrek GuWilliam R Appleyard [id number] Admission Data: 06/21/2014 5:13 PM Attending Provider: Aletta EdouardShilpa Bhardwaj, MD NWG:NFAOZPCP:KLIMA, Smith MinceLAWRENCE D, MD Code Status: Full  Derrek GuWilliam R Horsey is a 69 y.o. male patient admitted from ED:  -No acute distress noted.  -No complaints of shortness of breath.  -No complaints of chest pain.   Cardiac Monitoring: Box # 26 in place. Cardiac monitor yields:normal sinus rhythm.  Blood pressure 116/77, pulse 75, temperature 98.6 F (37 C), temperature source Oral, resp. rate 20, SpO2 99 %.   IV Fluids:  IV in place, occlusive dsg intact without redness, IV cath wrist left, condition patent and no redness normal saline.   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  Past Surgical History:   has past surgical history that includes Pilonidal cyst excision; Eye surgery; Fracture surgery; and Total hip arthroplasty (Left, 06/14/2014).  Social History:   reports that he quit smoking about 46 years ago. He has never used smokeless  tobacco. He reports that he does not drink alcohol or use illicit drugs.  Skin: Intact  Patient/Family orientated to room. Information packet given to patient/family. Admission inpatient armband information verified with patient/family to include name and date of birth and placed on patient arm. Side rails up x 2, fall assessment and education completed with patient/family. Patient/family able to verbalize understanding of risk associated with falls and verbalized understanding to call for assistance before getting out of bed. Call light within reach. Patient/family able to voice and demonstrate understanding of unit orientation instructions.    Will continue to evaluate and treat per MD orders.

## 2014-06-22 ENCOUNTER — Encounter (HOSPITAL_COMMUNITY): Payer: Self-pay | Admitting: *Deleted

## 2014-06-22 ENCOUNTER — Observation Stay (HOSPITAL_COMMUNITY): Payer: Medicare PPO

## 2014-06-22 DIAGNOSIS — N401 Enlarged prostate with lower urinary tract symptoms: Secondary | ICD-10-CM

## 2014-06-22 DIAGNOSIS — E1122 Type 2 diabetes mellitus with diabetic chronic kidney disease: Secondary | ICD-10-CM

## 2014-06-22 DIAGNOSIS — N183 Chronic kidney disease, stage 3 (moderate): Secondary | ICD-10-CM | POA: Diagnosis not present

## 2014-06-22 DIAGNOSIS — I2510 Atherosclerotic heart disease of native coronary artery without angina pectoris: Secondary | ICD-10-CM

## 2014-06-22 DIAGNOSIS — K922 Gastrointestinal hemorrhage, unspecified: Secondary | ICD-10-CM | POA: Diagnosis not present

## 2014-06-22 DIAGNOSIS — R351 Nocturia: Secondary | ICD-10-CM

## 2014-06-22 DIAGNOSIS — I129 Hypertensive chronic kidney disease with stage 1 through stage 4 chronic kidney disease, or unspecified chronic kidney disease: Secondary | ICD-10-CM | POA: Diagnosis not present

## 2014-06-22 DIAGNOSIS — Z7982 Long term (current) use of aspirin: Secondary | ICD-10-CM

## 2014-06-22 DIAGNOSIS — E11319 Type 2 diabetes mellitus with unspecified diabetic retinopathy without macular edema: Secondary | ICD-10-CM

## 2014-06-22 DIAGNOSIS — Z794 Long term (current) use of insulin: Secondary | ICD-10-CM

## 2014-06-22 DIAGNOSIS — Z87891 Personal history of nicotine dependence: Secondary | ICD-10-CM

## 2014-06-22 DIAGNOSIS — E1151 Type 2 diabetes mellitus with diabetic peripheral angiopathy without gangrene: Secondary | ICD-10-CM

## 2014-06-22 LAB — CBC
HCT: 28.6 % — ABNORMAL LOW (ref 39.0–52.0)
HCT: 27.3 % — ABNORMAL LOW (ref 39.0–52.0)
HEMATOCRIT: 28.2 % — AB (ref 39.0–52.0)
HEMOGLOBIN: 9.2 g/dL — AB (ref 13.0–17.0)
Hemoglobin: 9.5 g/dL — ABNORMAL LOW (ref 13.0–17.0)
Hemoglobin: 9.3 g/dL — ABNORMAL LOW (ref 13.0–17.0)
MCH: 24.6 pg — ABNORMAL LOW (ref 26.0–34.0)
MCH: 24.3 pg — ABNORMAL LOW (ref 26.0–34.0)
MCH: 24.9 pg — AB (ref 26.0–34.0)
MCHC: 33.2 g/dL (ref 30.0–36.0)
MCHC: 33 g/dL (ref 30.0–36.0)
MCHC: 33.7 g/dL (ref 30.0–36.0)
MCV: 74.1 fL — ABNORMAL LOW (ref 78.0–100.0)
MCV: 73.6 fL — ABNORMAL LOW (ref 78.0–100.0)
MCV: 74 fL — ABNORMAL LOW (ref 78.0–100.0)
PLATELETS: 199 10*3/uL (ref 150–400)
Platelets: 206 10*3/uL (ref 150–400)
Platelets: 206 10*3/uL (ref 150–400)
RBC: 3.86 MIL/uL — AB (ref 4.22–5.81)
RBC: 3.83 MIL/uL — ABNORMAL LOW (ref 4.22–5.81)
RBC: 3.69 MIL/uL — ABNORMAL LOW (ref 4.22–5.81)
RDW: 14.6 % (ref 11.5–15.5)
RDW: 14.6 % (ref 11.5–15.5)
RDW: 14.7 % (ref 11.5–15.5)
WBC: 7.8 10*3/uL (ref 4.0–10.5)
WBC: 8 10*3/uL (ref 4.0–10.5)
WBC: 9.7 10*3/uL (ref 4.0–10.5)

## 2014-06-22 LAB — HIV ANTIBODY (ROUTINE TESTING W REFLEX): HIV Screen 4th Generation wRfx: NONREACTIVE

## 2014-06-22 LAB — TYPE AND SCREEN
ABO/RH(D): O POS
Antibody Screen: NEGATIVE
Unit division: 0
Unit division: 0

## 2014-06-22 LAB — BASIC METABOLIC PANEL
Anion gap: 9 (ref 5–15)
BUN: 56 mg/dL — ABNORMAL HIGH (ref 6–23)
CO2: 16 mmol/L — AB (ref 19–32)
CREATININE: 2.23 mg/dL — AB (ref 0.50–1.35)
Calcium: 8.1 mg/dL — ABNORMAL LOW (ref 8.4–10.5)
Chloride: 115 mmol/L — ABNORMAL HIGH (ref 96–112)
GFR, EST AFRICAN AMERICAN: 32 mL/min — AB (ref >90)
GFR, EST NON AFRICAN AMERICAN: 28 mL/min — AB (ref >90)
Glucose, Bld: 205 mg/dL — ABNORMAL HIGH (ref 70–99)
POTASSIUM: 4.6 mmol/L (ref 3.5–5.1)
SODIUM: 140 mmol/L (ref 135–145)

## 2014-06-22 LAB — IRON AND TIBC
Iron: 10 ug/dL — ABNORMAL LOW (ref 42–165)
SATURATION RATIOS: 5 % — AB (ref 20–55)
TIBC: 200 ug/dL — ABNORMAL LOW (ref 215–435)
UIBC: 190 ug/dL (ref 125–400)

## 2014-06-22 LAB — MAGNESIUM: Magnesium: 1.7 mg/dL (ref 1.5–2.5)

## 2014-06-22 LAB — GLUCOSE, CAPILLARY
GLUCOSE-CAPILLARY: 186 mg/dL — AB (ref 70–99)
GLUCOSE-CAPILLARY: 220 mg/dL — AB (ref 70–99)
Glucose-Capillary: 193 mg/dL — ABNORMAL HIGH (ref 70–99)
Glucose-Capillary: 181 mg/dL — ABNORMAL HIGH (ref 70–99)

## 2014-06-22 LAB — CULTURE, BLOOD (ROUTINE X 2)
Culture: NO GROWTH
Culture: NO GROWTH

## 2014-06-22 LAB — FERRITIN: Ferritin: 215 ng/mL (ref 22–322)

## 2014-06-22 LAB — RETICULOCYTES
RBC.: 3.83 MIL/uL — ABNORMAL LOW (ref 4.22–5.81)
RETIC COUNT ABSOLUTE: 53.6 10*3/uL (ref 19.0–186.0)
Retic Ct Pct: 1.4 % (ref 0.4–3.1)

## 2014-06-22 LAB — PHOSPHORUS: Phosphorus: 4.7 mg/dL — ABNORMAL HIGH (ref 2.3–4.6)

## 2014-06-22 LAB — VITAMIN B12: Vitamin B-12: 814 pg/mL (ref 211–911)

## 2014-06-22 LAB — FOLATE: FOLATE: 15.1 ng/mL

## 2014-06-22 MED ORDER — POLYETHYLENE GLYCOL 3350 17 G PO PACK
17.0000 g | PACK | Freq: Once | ORAL | Status: AC
  Administered 2014-06-22: 17 g via ORAL
  Filled 2014-06-22: qty 1

## 2014-06-22 NOTE — Evaluation (Signed)
Physical Therapy Evaluation Patient Details Name: DEONTA BOMBERGER MRN: [id number] DOB: [birth year] Today's Date: 06/22/2014   History of Present Illness  Patient is a 69 y/o male admitted from SNF due to anemia. PMH of DM II complicated by retinopathy, PVD, HTN, CKD stage III and CAD. Pt s/p left hip hemi arthroplasty on 06/14/14.  Clinical Impression  Patient presents with generalized weakness esp of LLE s/p left hip hemiarthroplasty 3/23 and hx of Rt AKA impacting mobility. Pt did not have prosthesis present for session. Not able to obtain full standing even with Max A of 2 due to weakness. PTA, pt at [facility] getting rehab and appropriate to return there to improve transfers, gait, balance and mobility to maximize independence prior to return home. Will continue to follow acutely to improve functional mobility.    Follow Up Recommendations SNF;Supervision/Assistance - 24 hour    Equipment Recommendations  None recommended by PT    Recommendations for Other Services       Precautions / Restrictions Precautions Precautions: Fall Precaution Comments: right AKA; s/p L hemi arthroplasty anterior approach 3/23. Restrictions Weight Bearing Restrictions: Yes Other Position/Activity Restrictions: WBAT      Mobility  Bed Mobility Overal bed mobility: Needs Assistance Bed Mobility: Rolling;Sidelying to Sit;Sit to Supine Rolling: Min assist Sidelying to sit: Max assist;+2 for physical assistance;HOB elevated   Sit to supine: Mod assist;HOB elevated   General bed mobility comments: Assist to scoot bottom to EOB, mobilize LLE and elevate trunk. increased time and cues. Assisted LLE into bed. Heavy use of rails. Able to assist scooting bottom along side bed with Min A using chuck pad  Transfers Overall transfer level: Needs assistance Equipment used: Rolling walker (2 wheeled)   Sit to Stand: Max assist;+2 physical assistance         General transfer comment: Max A of 2 to  perform 3/4 standing EOB with therapist providing assist with hip extension and knee extension on left. Not able to obtain fully upright. Performed x2.  Ambulation/Gait             General Gait Details: unable at this time. prosthesis in car as pt to be d.ced to SNF today.  Stairs            Wheelchair Mobility    Modified Rankin (Stroke Patients Only)       Balance Overall balance assessment: Needs assistance Sitting-balance support: Feet supported;No upper extremity supported Sitting balance-Leahy Scale: Fair     Standing balance support: During functional activity Standing balance-Leahy Scale: Zero Standing balance comment: Not able to obtaim full standing position even with Max A of 2 (pt did not have prosthesis).                             Pertinent Vitals/Pain Pain Assessment: Faces Faces Pain Scale: Hurts even more Pain Location: left hip with movement. Pain Descriptors / Indicators: Sore;Aching Pain Intervention(s): Limited activity within patient's tolerance;Monitored during session;Repositioned    Home Living Family/patient expects to be discharged to:: Skilled nursing facility                      Prior Function Level of Independence: Needs assistance   Gait / Transfers Assistance Needed: Pt nonambulatory after being discharged from hospital 3/25. Participating in rehab at [facility].   ADL's / Homemaking Assistance Needed: Assist with ADls.        Hand Dominance  Extremity/Trunk Assessment   Upper Extremity Assessment: Defer to OT evaluation;Overall WFL for tasks assessed           Lower Extremity Assessment: Generalized weakness;LLE deficits/detail   LLE Deficits / Details: Decreased AROM throughout secondary to pain/stiffness. Grossly ~2+/5 knee extension, 2/5 hip flexion, 2+/5 DF  Cervical / Trunk Assessment: Normal  Communication   Communication: No difficulties  Cognition Arousal/Alertness:  Awake/alert Behavior During Therapy: WFL for tasks assessed/performed Overall Cognitive Status: Within Functional Limits for tasks assessed                      General Comments      Exercises        Assessment/Plan    PT Assessment Patient needs continued PT services  PT Diagnosis Generalized weakness;Acute pain   PT Problem List Decreased strength;Pain;Cardiopulmonary status limiting activity;Decreased range of motion;Decreased activity tolerance;Decreased balance;Decreased mobility  PT Treatment Interventions Balance training;Gait training;Patient/family education;Functional mobility training;Therapeutic activities;Therapeutic exercise   PT Goals (Current goals can be found in the Care Plan section) Acute Rehab PT Goals Patient Stated Goal: to rehab then home PT Goal Formulation: With patient/family Time For Goal Achievement: 07/06/14 Potential to Achieve Goals: Good    Frequency Min 2X/week   Barriers to discharge        Co-evaluation               End of Session Equipment Utilized During Treatment: Gait belt Activity Tolerance: Patient limited by fatigue Patient left: in bed;with call bell/phone within reach;with bed alarm set;with family/visitor present Nurse Communication: Mobility status    Functional Assessment Tool Used: Clinical judgment Functional Limitation: Mobility: Walking and moving around Mobility: Walking and Moving Around Current Status ([id number]): At least 80 percent but less than 100 percent impaired, limited or restricted Mobility: Walking and Moving Around Goal Status [phone]): At least 40 percent but less than 60 percent impaired, limited or restricted    Time: 1424-1452 PT Time Calculation (min) (ACUTE ONLY): 28 min   Charges:   PT Evaluation $Initial PT Evaluation Tier I: 1 Procedure PT Treatments $Therapeutic Activity: 8-22 mins   PT G Codes:   PT G-Codes **NOT FOR INPATIENT CLASS** Functional Assessment Tool Used:  Clinical judgment Functional Limitation: Mobility: Walking and moving around Mobility: Walking and Moving Around Current Status ([id number]([id number]): At least 80 percent but less than 100 percent impaired, limited or restricted Mobility: Walking and Moving Around Goal Status [phone]([phone]): At least 40 percent but less than 60 percent impaired, limited or restricted    Alvie HeidelbergFolan, Dnya Hickle A 06/22/2014, 3:24 PM Alvie HeidelbergShauna Folan, PT, DPT [phone]

## 2014-06-22 NOTE — Progress Notes (Signed)
Subjective: Pt has no complaints this morning. Denies dizziness, SOB, blurry vision, and chest pain. Has not had a bowel movement since admission and does not feel the urge to have a BM.  Objective: Vital signs in last 24 hours: Filed Vitals:   06/21/14 1646 06/21/14 1903 06/21/14 2121 06/22/14 0539  BP: 132/74 129/67 138/74 132/73  Pulse: 75 76 75 79  Temp: 98.9 F (37.2 C) 97.8 F (36.6 C) 98.5 F (36.9 C) 99.2 F (37.3 C)  TempSrc: Oral Oral Oral Oral  Resp: 18 20 16 18   Height:    5\' 8"  (1.727 m)  Weight:    198 lb 14.4 oz (90.22 kg)  SpO2:   99% 100%   Weight change:   Intake/Output Summary (Last 24 hours) at 06/22/14 1124 Last data filed at 06/22/14 1115  Gross per 24 hour  Intake    510 ml  Output   3225 ml  Net  -2715 ml   General: NAD, laying in bed comfortably Lungs: clear on anterior auscultation Cardiac: RRR, no murmurs GI: distended, hypoactive bowel sounds Neuro: CN II-XII grossly intact  Lab Results: Basic Metabolic Panel:  Recent Labs Lab 06/21/14 1224 06/22/14 0600  NA 138 140  K 4.3 4.6  CL 115* 115*  CO2 17* 16*  GLUCOSE 87 205*  BUN 63* 56*  CREATININE 2.27* 2.23*  CALCIUM 7.7* 8.1*  MG  --  1.7  PHOS  --  4.7*   Liver Function Tests:  Recent Labs Lab 06/21/14 1224  AST 31  ALT 23  ALKPHOS 85  BILITOT 0.5  PROT 5.9*  ALBUMIN 2.1*   CBC:  Recent Labs Lab 06/21/14 1224 06/22/14 0236 06/22/14 0600  WBC 9.3 7.8 8.0  NEUTROABS 6.9  --   --   HGB 6.7* 9.5* 9.3*  HCT 20.2* 28.6* 28.2*  MCV 72.7* 74.1* 73.6*  PLT 202 206 199   Cardiac Enzymes:  Recent Labs Lab 06/21/14 2048  TROPONINI 0.03   CBG:  Recent Labs Lab 06/17/14 2139 06/18/14 0832 06/18/14 1235 06/21/14 1733 06/21/14 2118 06/22/14 0803  GLUCAP 171* 147* 164* 108* 159* 186*   Anemia Panel:  Recent Labs Lab 06/22/14 0600  RETICCTPCT 1.4    Medications: I have reviewed the patient's current medications. Scheduled Meds: . amLODipine  10 mg  Oral q morning - 10a  . calcium-vitamin D  1 tablet Oral Q breakfast  . carvedilol  12.5 mg Oral BID WC  . docusate sodium  100 mg Oral BID  . enoxaparin (LOVENOX) injection  30 mg Subcutaneous Q24H  . furosemide  40 mg Oral Daily  . insulin aspart  0-15 Units Subcutaneous TID WC  . terazosin  10 mg Oral QHS   Continuous Infusions:  PRN Meds:.acetaminophen **OR** acetaminophen, fluticasone, polyvinyl alcohol Assessment/Plan: Principal Problem:   GI bleed Active Problems:   Essential hypertension   Coronary artery disease involving native heart without angina pectoris   Type 2 diabetes mellitus with stage 3 chronic kidney disease   Hip joint replacement status   Anemia   GI bleed w/ concomitant hemoglobin equilibration from acute blood loss-- recent left hip replacement on 3/23 w/ estimated blood loss of 250cc. FOBT positive on admission. On lovenox renal dose and asa since sx. Likley drop in hgb from 7.4 on discharge from hospital on 3/27  to 6.6 yesterday significant for large GI bleed.  - f/u with Dr. Elnoria HowardHung with GI next week on 06/26/14 at 3:00pm made - stopped asa, cont  lovenox - hgb this morning 9.3 from 9.5 yesterday s/p 2 units RBCs, repeat CBC at noon, if stable can discharge back to SNF today.   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 Home insulin regimen is NovoLog 70/3030 units twice daily. -moderate sliding scale insulin  Chronic kidney disease stage III: Creatinine 2.27 on admission, baseline 1.5-1.9.  - creatinine 2.23 this am, will cont to monitor  Hypertension: Home medications include amlodipine 10 mg, Coreg 12.5 mg twice daily, and terazosin 10 mg at bedtime -Continue cont home meds  Coronary artery disease:hold aspirin 81. - will recommend daily CBC for stable hgb at which time asa can be resumed.   Benign prostatic hypertrophy with nocturia: Terazosin as noted above.  DVT ppx-- renally dose lovenox per  pharm.   Dispo: Disposition is deferred at this time, awaiting improvement of current medical problems.   The patient does have a current PCP Doneen Poisson, MD) and does need an [facility] hospital follow-up appointment after discharge.  The patient does not have transportation limitations that hinder transportation to clinic appointments .Services Needed at time of discharge: Y = Yes, Blank = No PT:   OT:   RN:   Equipment:   Other:     LOS: 1 day   Denton Brick, MD 06/22/2014, 11:24 AM

## 2014-06-22 NOTE — Progress Notes (Signed)
Per Danella Pentonruong MD, give tylenol for low grade fever

## 2014-06-22 NOTE — Clinical Social Work Note (Signed)
Clinical Social Work Department BRIEF PSYCHOSOCIAL ASSESSMENT 06/22/2014  Patient:  [name] [name], [name] [name]     Account Number:  [id number]     Admit date:  06/21/2014  Clinical Social Worker:  Myles Lipps  Date/Time:  06/22/2014 04:20 PM  Referred by:  RN  Date Referred:  06/22/2014 Referred for  SNF Placement   Other Referral:   Interview type:  Family Other interview type:   Spoke with patient wife in patient room while patient was having a CT done.    PSYCHOSOCIAL DATA Living Status:  FACILITY Admitted from facility:  [place] Level of care:  [place] Primary support name:  Bernon Arviso [phone] Primary support relationship to patient:  SPOUSE Degree of support available:   Adequate    CURRENT CONCERNS Current Concerns  Post-Acute Placement   Other Concerns:    SOCIAL WORK ASSESSMENT / PLAN Clinical Social Worker met with patient wife in patient room to offer support and discuss patient needs at discharge.  Patient wife states that patient has been at [facility] since 06/18/14 after receiving [facility] authorization for placement.  Patient wife states that they would like patient to return at discharge.  CSW spoke with facility admissions coordinator who states that patient is able to return to facility but must have a new [employer] authorization prior to admission.  CSW has provided necessary clinicals to the facility and await authorization for placement.  CSW has updated patient wife at bedside while patient is having a CT completed.  RN updated on patient social status for discharge.  CSW remains available for support and to facilitate patient discharge needs once medically stable.   Assessment/plan status:  Psychosocial Support/Ongoing Assessment of Needs Other assessment/ plan:   Information/referral to community resources:   Holiday representative offered patient wife alternative facility options, however she adamantly states that patient  will return to [facility]    PATIENT'S/FAMILY'S RESPONSE TO PLAN OF CARE: Patient alert and oriented x3 and down having a CT upon CSW arrival.  Patient wife engaged in conversation and very aware of patient current situation.  Patient wife states that patient will return to [facility] and is hopeful for tomorrow.  CSW provided information to patient wife regarding insurance authorization needs prior to admission. Patient wife verbalized understanding and appreciation for support and involvement.

## 2014-06-22 NOTE — Discharge Summary (Signed)
Name: [name] [name] MRN: [id number] DOB: 12-05-1945 69 y.o. PCP: Doneen Poisson, MD  Date of Admission: 06/21/2014 12:08 PM Date of Discharge: 06/23/2014 Attending Physician: Aletta Edouard, MD  Discharge Diagnosis: Principal Problem:   GI bleed Active Problems:   Essential hypertension   Coronary artery disease involving native heart without angina pectoris   Type 2 diabetes mellitus with stage 3 chronic kidney disease   Hip joint replacement status   Anemia   Absolute anemia  Discharge Medications:   Medication List    STOP taking these medications        aspirin EC 81 MG tablet      TAKE these medications        amLODipine 10 MG tablet  Commonly known as:  NORVASC  Take 1 tablet (10 mg total) by mouth every morning.     calcium citrate-vitamin D 315-200 MG-UNIT per tablet  Commonly known as:  CITRACAL+D  Take 1 tablet by mouth daily.     carvedilol 12.5 MG tablet  Commonly known as:  COREG  Take 1 tablet (12.5 mg total) by mouth 2 (two) times daily with a meal.     enoxaparin 30 MG/0.3ML injection  Commonly known as:  LOVENOX  Inject 0.3 mLs (30 mg total) into the skin daily.     ferrous sulfate 325 (65 FE) MG tablet  Take 1 tablet (325 mg total) by mouth daily with breakfast.     fish oil-omega-3 fatty acids 1000 MG capsule  Take 1 g by mouth daily.     fluticasone 50 MCG/ACT nasal spray  Commonly known as:  FLONASE  Place 2 sprays into both nostrils daily as needed for allergies.     furosemide 40 MG tablet  Commonly known as:  LASIX  Take as needed for a weight gain of more than 5 lbs in a week.     hydroxypropyl methylcellulose / hypromellose 2.5 % ophthalmic solution  Commonly known as:  ISOPTO TEARS / GONIOVISC  Place 2 drops into both eyes 4 (four) times daily as needed for dry eyes.     insulin aspart protamine - aspart (70-30) 100 UNIT/ML FlexPen  Commonly known as:  NOVOLOG MIX 70/30 FLEXPEN  Inject 0.3 mLs (30 Units total) into the  skin 2 (two) times daily.     Insulin Pen Needle 30G X 8 MM Misc  Commonly known as:  NOVOFINE  Inject SQ as directed twice daily, use new pen needle for each injection     multivitamin with minerals tablet  Take 1 tablet by mouth daily.     oxyCODONE 5 MG immediate release tablet  Commonly known as:  Oxy IR/ROXICODONE  Take 1 tablet by mouth every 4 hours as needed for mild to moderate pain: Take 2 tablets =10 mg by mouth every 4 hours as needed for moderate to severe pain : Take 1 tablet by mouth every 4 hours as needed for severe/breakthrough pain.     terazosin 10 MG capsule  Commonly known as:  HYTRIN  Take 10 mg by mouth at bedtime.        Disposition and follow-up:   [name] [name] was discharged from [facility] in Stable condition.  At the hospital follow up visit please address:  1.  Please check daily CBCs at SNF to make sure hgb is stable. Continue lovenox until 06/28/14 and after that can resume 81mg  daily asa. Medications should be renally dosed.  Please make appointment at Internal  Medicine clinic when patient is ready for discharge from SNF.  2.  Labs / imaging needed at time of follow-up: CBC  3.  Pending labs/ test needing follow-up: none  4. Please ensure pt goes to GI f/u appt on 06/26/14 for f/u on possible GI bleed, also due for a colonoscopy 2017.  5. Needs outpatient nephrology referral for CKD management.   6. Please ensure all pt's medications are renally dosed.   Follow-up Appointments: Follow-up Information    Follow up with Theda Belfast, MD On 06/26/2014.   Specialty:  Gastroenterology   Why:  at 3:00pm    Contact information:   [address] [place] [address] [phone]       Follow up with Rocco Serene, MD.   Specialty:  Internal Medicine   Why:  Have SNF call for appt   Contact information:   [address] [place] [address] [phone]       Procedures Performed:  Dg Chest 1  View  06/13/2014   CLINICAL DATA:  Fall.  Left hip pain.  Left femoral neck fracture.  EXAM: CHEST  1 VIEW  COMPARISON:  05/24/2013  FINDINGS: Prominent right upper paratracheal stripe and mild prominence of soft tissue density above the aortic arch, both worsened from 05/24/2013. Mediastinal width in this vicinity is 9.7 cm. Coronary atherosclerotic calcification is visible. Borderline cardiomegaly. No edema.  IMPRESSION: 1. Increased prominence of upper mediastinal soft tissues. I cannot exclude adenopathy or mass given the change from prior, although some of the appearance is probably attributable to the AP supine technique. Given that an PA upright view of the chest may be problematic due to the patient's hip fracture currently, chest CT should be considered. 2. Borderline cardiomegaly.   Electronically Signed   By: Gaylyn Rong M.D.   On: 06/13/2014 22:22   Dg Chest 2 View  06/16/2014   CLINICAL DATA:  Fever, shortness of breath, post hip surgery, history essential hypertension, type 2 diabetes, coronary artery disease, former smoker  EXAM: CHEST  2 VIEW  COMPARISON:  06/15/2014  FINDINGS: Upper normal heart size.  Normal mediastinal contours and pulmonary vascularity.  Lungs clear.  No pleural effusion or pneumothorax.  Persistent elevation LEFT diaphragm.  Gaseous distention of stomach.  IMPRESSION: No acute pulmonary abnormalities identified.  Gaseous distention of stomach.   Electronically Signed   By: Ulyses Southward M.D.   On: 06/16/2014 08:16   Dg Chest 2 View  06/15/2014   CLINICAL DATA:  Post hip surgery (3/23), now with fever.  EXAM: CHEST  2 VIEW  COMPARISON:  06/13/2014; 05/24/2013; 07/14/2006  FINDINGS: Grossly unchanged enlarged cardiac silhouette and mediastinal contours given persistently reduced lung volumes. Evaluation the retrosternal clear space obscured secondary to overlying soft tissues. Worsening bibasilar opacities left greater than right. Trace left-sided effusion is not  excluded. No definite evidence of edema. No pneumothorax. Unchanged bones. There is mild to moderate gas distention of the stomach.  IMPRESSION: Decreased lung volumes with worsening bibasilar opacities, left greater than right, atelectasis versus infiltrate. Continued attention on follow-up is recommended.   Electronically Signed   By: Simonne Come M.D.   On: 06/15/2014 20:44   Pelvis Portable  06/14/2014   CLINICAL DATA:  Status post left hip arthroplasty.  EXAM: PORTABLE PELVIS 1-2 VIEWS  COMPARISON:  None.  FINDINGS: Single AP image shows the left hip prosthesis to be well-seated and aligned. No acute fracture or evidence of an operative complication.  IMPRESSION: Well  aligned left hip prosthesis.   Electronically Signed   By: Amie Portland M.D.   On: 06/14/2014 17:32   Ct Hip Left Wo Contrast  06/22/2014   CLINICAL DATA:  Left hip pain and limited range of motion following left hip arthroplasty 3 days ago. History of diabetes.  EXAM: CT OF THE LEFT HIP WITHOUT CONTRAST  TECHNIQUE: Multidetector CT imaging of the left hip was performed according to the standard protocol. Multiplanar CT image reconstructions were also generated.  COMPARISON:  Pelvic CT 06/10/2010.  Radiographs 06/14/2014.  FINDINGS: Examination is limited to the left hip and lower left hemipelvis. The left hip hemiarthroplasty appears well positioned. There is no evidence of acute fracture or dislocation. No large joint effusion or periarticular hematoma demonstrated.  There are postsurgical changes within the muscles and subcutaneous fat anterolaterally with small residual air bubbles. Diffuse atherosclerosis of the femoral arteries noted.  A small amount of air is noted within the urinary bladder lumen, probably related to recent instrumentation/ catheterization. Degenerative disc disease at L5-S1 appears grossly unchanged.  IMPRESSION: 1. No demonstrated complication related to recent left hip hemiarthroplasty. 2. Expected postsurgical  changes in the surrounding muscles and subcutaneous fat anterolaterally. 3. Diffuse atherosclerosis.   Electronically Signed   By: Carey Bullocks M.D.   On: 06/22/2014 17:15   Dg Hip Unilat With Pelvis 2-3 Views Left  06/13/2014   CLINICAL DATA:  Fall tonight with left hip pain  EXAM: LEFT HIP (WITH PELVIS) 2-3 VIEWS  COMPARISON:  None.  FINDINGS: There is a mildly displaced acute fracture of the left femoral neck, mainly transcervical. The left femoral head remains located.  No evidence of pelvic ring fracture or diastasis.  Osteopenia.  The right hip is negative.  IMPRESSION: Mildly displaced left femoral neck fracture.   Electronically Signed   By: Marnee Spring M.D.   On: 06/13/2014 21:59     Admission HPI: Mr. 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 Pt was last admitted on 06/13/14 for left hip fracture and had left hip hemi arthroplasty on 323/16. He was then discharged to SNF on renally dosed lovenox and asa  and was found to have a hgb of 6.6 today. Pt denies BRBPR or dark stools. Denies SOB, CP, dizziness, light headedness, DOE, abdominal pain, blurry vision, diaphoresis, HA, pallor, weakness or any pain. Left hip surgery pain has been well controlled with percocet at SNF. Pt had a colonoscopy in11/29/2007 notable for 5 mm sessile polyp in the rectum without signs of malignancy per biopsy along with internal and external hemorrhoids. He is due for a colonoscopy next year. Pt was [facility] ED and transfused 1 U RBCs. FOBT positive on their exam  Hospital Course by problem list: Principal Problem:   GI bleed Active Problems:   Essential hypertension   Coronary artery disease involving native heart without angina pectoris   Type 2 diabetes mellitus with stage 3 chronic kidney  disease   Hip joint replacement status   Anemia   Absolute anemia   GI bleed w/ concomitant hemoglobin equilibration from acute blood loss-- Pt received 1 U RBCs at [facility] ED and then a second unit at [facility]. Recent left hip replacement on 3/23 w/ estimated blood loss of 250cc. FOBT positive on admission. On lovenox renal dose and asa  since sx. Likley drop in hgb from 7.4 on discharge from hospital on 3/27to 6.6 yesterday not significant for large GI bleed. Pt has f/u with Dr. Elnoria HowardHung with GI next week on 06/26/14 at 3:00pm made. Stopped asa 81mg  and cont lovenox renal dose. Hgb morning of discharge 9.3 from 9.5 s/p 2 units RBCs on admission, repeat CBC is 8.9.  Pt denies SOB, CP, blurry vision, and dizziness day of discharge.   Coronary artery disease: Held aspirin 81. Resume ASA after 06/28/14 after stopping lovenox.   Chronic kidney disease stage III: Creatinine 2.27 on admission, baseline 1.5-1.9 but has been around 2.2 few days ago. May be his new baseline.   DM II - hgba1c 6.3 05/26/14. Home insulin 70/30 30 units BID. SSI here.   HTN  - home meds amlodipine, coreg, terazosin. Cont'd these here.  DVT ppx-- renally dose lovenox per pharm.   Discharge Vitals:   BP 129/68 mmHg  Pulse 80  Temp(Src) 99.1 F (37.3 C) (Axillary)  Resp 18  Ht 5\' 8"  (1.727 m)  Wt 198 lb 14.4 oz (90.22 kg)  BMI 30.25 kg/m2  SpO2 99%  Discharge Labs:  Results for orders placed or performed during the hospital encounter of 06/21/14 (from the past 24 hour(s))  Glucose, capillary     Status: Abnormal   Collection Time: 06/22/14 11:58 AM  Result Value Ref Range   Glucose-Capillary 193 (H) 70 - 99 mg/dL  CBC     Status: Abnormal   Collection Time: 06/22/14 12:46 PM  Result Value Ref Range   WBC 9.7 4.0 - 10.5 K/uL   RBC 3.69 (L) 4.22 - 5.81 MIL/uL   Hemoglobin 9.2 (L) 13.0 - 17.0 g/dL   HCT 16.127.3 (L) 09.639.0 - 04.552.0 %   MCV 74.0 (L) 78.0 - 100.0 fL   MCH 24.9 (L) 26.0 - 34.0 pg   MCHC 33.7 30.0 -  36.0 g/dL   RDW 40.914.7 81.111.5 - 91.415.5 %   Platelets 206 150 - 400 K/uL  Glucose, capillary     Status: Abnormal   Collection Time: 06/22/14  5:01 PM  Result Value Ref Range   Glucose-Capillary 220 (H) 70 - 99 mg/dL  Glucose, capillary     Status: Abnormal   Collection Time: 06/22/14 10:19 PM  Result Value Ref Range   Glucose-Capillary 181 (H) 70 - 99 mg/dL  CBC     Status: Abnormal   Collection Time: 06/23/14  5:50 AM  Result Value Ref Range   WBC 8.9 4.0 - 10.5 K/uL   RBC 3.59 (L) 4.22 - 5.81 MIL/uL   Hemoglobin 8.9 (L) 13.0 - 17.0 g/dL   HCT 78.226.1 (L) 95.639.0 - 21.352.0 %   MCV 72.7 (L) 78.0 - 100.0 fL   MCH 24.8 (L) 26.0 - 34.0 pg   MCHC 34.1 30.0 - 36.0 g/dL   RDW 08.614.8 57.811.5 - 46.915.5 %   Platelets 229 150 - 400 K/uL  Glucose, capillary     Status: Abnormal   Collection Time: 06/23/14  8:09 AM  Result Value Ref Range   Glucose-Capillary 194 (H) 70 - 99 mg/dL    Signed: Hyacinth Meekerasrif Merryl Buckels, MD 06/23/2014, 10:31 AM    Services Ordered on Discharge: none Equipment Ordered on Discharge: none

## 2014-06-23 ENCOUNTER — Encounter: Payer: Self-pay | Admitting: Internal Medicine

## 2014-06-23 DIAGNOSIS — K922 Gastrointestinal hemorrhage, unspecified: Secondary | ICD-10-CM | POA: Diagnosis not present

## 2014-06-23 LAB — CBC
HEMATOCRIT: 26.1 % — AB (ref 39.0–52.0)
HEMOGLOBIN: 8.9 g/dL — AB (ref 13.0–17.0)
MCH: 24.8 pg — ABNORMAL LOW (ref 26.0–34.0)
MCHC: 34.1 g/dL (ref 30.0–36.0)
MCV: 72.7 fL — ABNORMAL LOW (ref 78.0–100.0)
Platelets: 229 10*3/uL (ref 150–400)
RBC: 3.59 MIL/uL — AB (ref 4.22–5.81)
RDW: 14.8 % (ref 11.5–15.5)
WBC: 8.9 10*3/uL (ref 4.0–10.5)

## 2014-06-23 LAB — GLUCOSE, CAPILLARY
Glucose-Capillary: 194 mg/dL — ABNORMAL HIGH (ref 70–99)
Glucose-Capillary: 225 mg/dL — ABNORMAL HIGH (ref 70–99)

## 2014-06-23 MED ORDER — ENOXAPARIN SODIUM 30 MG/0.3ML ~~LOC~~ SOLN: 30.0000 mg | SUBCUTANEOUS | Status: DC

## 2014-06-23 MED ORDER — FERROUS SULFATE 325 (65 FE) MG PO TABS: 325.0000 mg | ORAL_TABLET | Freq: Every day | ORAL | Status: DC

## 2014-06-23 MED ORDER — FERROUS SULFATE 325 (65 FE) MG PO TABS
325.0000 mg | ORAL_TABLET | Freq: Every day | ORAL | Status: DC
  Administered 2014-06-23: 325 mg via ORAL
  Filled 2014-06-23 (×2): qty 1

## 2014-06-23 NOTE — Evaluation (Signed)
Occupational Therapy Evaluation Patient Details Name: [name] [name] MRN: [id number] DOB: 11/09/1945 Today's Date: 06/23/2014    History of Present Illness Patient is a 69 y/o male admitted from SNF due to anemia. PMH of DM II complicated by retinopathy, PVD, HTN, CKD stage III and CAD. Pt s/p left hip hemi arthroplasty on 06/14/14.   Clinical Impression   Pt with decline in function and safety with ADLs and ADL mobility with decreased strength, balance and endurance. PTA pt recently dc from acute care to SNF for rehab. Pt to d/c back to SNF to resume rehab once medically stable. No further acute OT services are indicated at this time. Defer further OT intervention to SNF    Follow Up Recommendations  [facility]    Equipment Recommendations  Other (comment) (TBD at next venue of care)    Recommendations for Other Services       Precautions / Restrictions Precautions Precautions: Fall Precaution Comments: right AKA; s/p L hemi arthroplasty anterior approach 3/23. Restrictions Weight Bearing Restrictions: Yes LLE Weight Bearing: Weight bearing as tolerated Other Position/Activity Restrictions: WBAT      Mobility Bed Mobility Overal bed mobility: Needs Assistance Bed Mobility: Rolling;Sidelying to Sit;Sit to Supine Rolling: Min assist Sidelying to sit: Max assist;+2 for physical assistance;HOB elevated Supine to sit: Max assist;+2 for physical assistance;HOB elevated Sit to supine: Mod assist;HOB elevated   General bed mobility comments: Assist to scoot bottom to EOB, mobilize LLE and elevate trunk. increased time and cues. Assisted LLE into bed. Heavy use of rails. Able to assist scooting bottom along side bed with Min A using chuck pad  Transfers                 General transfer comment: unable due to requring 2 person assist. Per PT note, pt max A + 2    Balance Overall balance assessment: Needs assistance Sitting-balance support: No upper  extremity supported;Feet supported Sitting balance-Leahy Scale: Fair                                      ADL Overall ADL's : Needs assistance/impaired     Grooming: Set up;Sitting;Wash/dry hands;Wash/dry face;Supervision/safety   Upper Body Bathing: Set up;Sitting;Supervision/ safety   Lower Body Bathing: Maximal assistance;Sitting/lateral leans   Upper Body Dressing : Set up;Sitting;Supervision/safety   Lower Body Dressing: Total assistance;Sitting/lateral leans     Toilet Transfer Details (indicate cue type and reason): unable due to requring 2 person assist. Per PT note, pt max A + 2 Toileting- Clothing Manipulation and Hygiene: Total assistance       Functional mobility during ADLs:  (unable due to requring 2 person assist. Per PT note, pt max A + 2) General ADL Comments: pt fatigues easily     Vision  glasses for driving, distance   Perception Perception Perception Tested?: No   Praxis Praxis Praxis tested?: Not tested    Pertinent Vitals/Pain Pain Assessment: 0-10 Pain Score: 5  Pain Location: L hip woth movment Pain Descriptors / Indicators: Sore Pain Intervention(s): Limited activity within patient's tolerance;Monitored during session;Repositioned     Hand Dominance Right   Extremity/Trunk Assessment Upper Extremity Assessment Upper Extremity Assessment: Overall WFL for tasks assessed;Generalized weakness   Lower Extremity Assessment Lower Extremity Assessment: Defer to PT evaluation   Cervical / Trunk Assessment Cervical / Trunk Assessment: Normal   Communication Communication Communication: No  difficulties   Cognition Arousal/Alertness: Awake/alert Behavior During Therapy: WFL for tasks assessed/performed Overall Cognitive Status: Within Functional Limits for tasks assessed                     General Comments   Pt pleasant and cooperative, family supportive                 Home Living Family/patient expects  to be discharged to:: Skilled nursing facility Living Arrangements: Spouse/significant other Available Help at Discharge: Family Type of Home: House Home Access: Ramped entrance     Home Layout: Two level;Able to live on main level with bedroom/bathroom               Home Equipment: Dan Humphreys - 2 wheels;Shower seat;Wheelchair - power;Bedside commode          Prior Functioning/Environment Level of Independence: Needs assistance  Gait / Transfers Assistance Needed: Pt nonambulatory after being discharged from hospital 3/25. Participating in rehab at [facility].  ADL's / Homemaking Assistance Needed: Assist with ADls.        OT Diagnosis: Generalized weakness;Acute pain   OT Problem List: Decreased strength;Decreased range of motion;Impaired balance (sitting and/or standing);Pain;Obesity;Decreased knowledge of use of DME or AE   OT Treatment/Interventions:      OT Goals(Current goals can be found in the care plan section) Acute Rehab OT Goals Patient Stated Goal: to rehab then home OT Goal Formulation: With patient/family  OT Frequency:     Barriers to D/C:  decreased caregiver support                        End of Session    Activity Tolerance: Patient limited by pain Patient left: in bed;with call bell/phone within reach;with family/visitor present   Time: 1005-1032 OT Time Calculation (min): 27 min Charges:  OT General Charges $OT Visit: 1 Procedure OT Evaluation $Initial OT Evaluation Tier I: 1 Procedure OT Treatments $Therapeutic Activity: 8-22 mins G-Codes: OT G-codes **NOT FOR INPATIENT CLASS** Functional Limitation: Self care Self Care Current Status (Z6109): At least 80 percent but less than 100 percent impaired, limited or restricted Self Care Goal Status (U0454): At least 80 percent but less than 100 percent impaired, limited or restricted Self Care Discharge Status [phone]): At least 80 percent but less than 100 percent impaired, limited or  restricted  [name] [name] 06/23/2014, 1:18 PM

## 2014-06-23 NOTE — Progress Notes (Signed)
This encounter was created in error - please disregard.

## 2014-06-23 NOTE — Clinical Social Work Note (Signed)
Per MD [facility] to [facility]. RN, patient/family (wife at bedside), and facility notified of patient's DC. RN given number for report. DC packet on patient's chart. Ambulance transport requested for patient (next available). CSW signing off at this time.   Roddie McBryant Johnie Stadel MSW, [place], [place], [phone]

## 2014-06-23 NOTE — Progress Notes (Signed)
Subjective: Pt is doing well. Has hip pain that's under control. No n/v/diarrhea. No chest pain/SOB, ab pain.   Objective: Vital signs in last 24 hours: Filed Vitals:   06/22/14 1403 06/22/14 1459 06/22/14 2219 06/23/14 0531  BP: 131/73  138/74 127/73  Pulse: 77  77 81  Temp: 100.4 F (38 C) 98.9 F (37.2 C) 98.5 F (36.9 C) 99.1 F (37.3 C)  TempSrc: Axillary Oral Oral Axillary  Resp: Height:      Weight:      SpO2: 97%  100% 99%   Weight change:   Intake/Output Summary (Last 24 hours) at 06/23/14 0728 Last data filed at 06/23/14 0530  Gross per 24 hour  Intake    120 ml  Output   2150 ml  Net  -2030 ml   General: NAD, laying in bed comfortably Lungs: clear on anterior auscultation Cardiac: RRR, no murmurs GI: distended, hypoactive bowel sounds Neuro: CN II-XII grossly intact  Lab Results: Basic Metabolic Panel:  Recent Labs Lab 06/21/14 1224 06/22/14 0600  NA 138 140  K 4.3 4.6  CL 115* 115*  CO2 17* 16*  GLUCOSE 87 205*  BUN 63* 56*  CREATININE 2.27* 2.23*  CALCIUM 7.7* 8.1*  MG  --  1.7  PHOS  --  4.7*   Liver Function Tests:  Recent Labs Lab 06/21/14 1224  AST 31  ALT 23  ALKPHOS 85  BILITOT 0.5  PROT 5.9*  ALBUMIN 2.1*   CBC:  Recent Labs Lab 06/21/14 1224  06/22/14 1246 06/23/14 0550  WBC 9.3  < > 9.7 8.9  NEUTROABS 6.9  --   --   --   HGB 6.7*  < > 9.2* 8.9*  HCT 20.2*  < > 27.3* 26.1*  MCV 72.7*  < > 74.0* 72.7*  PLT 202  < > 206 229  < > = values in this interval not displayed. Cardiac Enzymes:  Recent Labs Lab 06/21/14 2048  TROPONINI 0.03   CBG:  Recent Labs Lab 06/21/14 1733 06/21/14 2118 06/22/14 0803 06/22/14 1158 06/22/14 1701 06/22/14 2219  GLUCAP 108* 159* 186* 193* 220* 181*   Anemia Panel:  Recent Labs Lab 06/22/14 0600  VITAMINB12 814  FOLATE 15.1  FERRITIN 215  TIBC 200*  IRON 10*  RETICCTPCT 1.4    Medications: I have reviewed the patient's current  medications. Scheduled Meds: . amLODipine  10 mg Oral q morning - 10a  . calcium-vitamin D  1 tablet Oral Q breakfast  . carvedilol  12.5 mg Oral BID WC  . docusate sodium  100 mg Oral BID  . enoxaparin (LOVENOX) injection  30 mg Subcutaneous Q24H  . furosemide  40 mg Oral Daily  . insulin aspart  0-15 Units Subcutaneous TID WC  . terazosin  10 mg Oral QHS   Continuous Infusions:  PRN Meds:.acetaminophen **OR** acetaminophen, fluticasone, polyvinyl alcohol Assessment/Plan: Principal Problem:   GI bleed Active Problems:   Essential hypertension   Coronary artery disease involving native heart without angina pectoris   Type 2 diabetes mellitus with stage 3 chronic kidney disease   Hip joint replacement status   Anemia   Absolute anemia   GI bleed w/ concomitant hemoglobin equilibration from acute blood loss- recent sx for left femoral neck fracture on 3/23 w/ estimated blood loss of 250cc. FOBT positive on admission. On lovenox and asa since sx. Likley drop in hgb from 7.4 on discharge from hospital on 3/27  to 6.6  yesterday significant for large GI bleed.  - f/u with Dr. Elnoria HowardHung with GI next week on 06/26/14 at 3:00pm made.  - stopped asa, cont lovenox until 06/28/14 per Dr. Roda ShuttersXu.  - Received 2 units of RBCs. hgb stable around 8.9 today. Can discharge back to SNF today. Resume asa after lovenox completed.   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 Home insulin regimen is NovoLog 70/3030 units twice daily. -moderate sliding scale insulin here. Back to home regimen on discharge.  Chronic kidney disease stage III: Creatinine 2.27 on admission, baseline 1.5-2.2.   - creatinine 2.23 here. Will ask PCP to refer to Nephrology for CKD management on hospital follow up.   Hypertension: Home medications include amlodipine 10 mg, Coreg 12.5 mg twice daily, and terazosin 10 mg at bedtime -Continue cont home meds  Coronary artery disease:hold  aspirin 81. - will resume asa once lovenox is completed s/p hip surgery.  Benign prostatic hypertrophy with nocturia: Terazosin as noted above.  DVT ppx-- renally dose lovenox per pharm.   Dispo: likely discharge to SNF today.   The patient does have a current PCP Doneen Poisson(Lawrence Klima, MD) and [facility] after discharge.  The patient does not have transportation limitations that hinder transportation to clinic appointments .Services Needed at time of discharge: Y = Yes, Blank = No PT:   OT:   RN:   Equipment:   Other:     LOS: 2 days   Hyacinth Meekerasrif Nakiyah Beverley, MD 06/23/2014, 7:28 AM

## 2014-06-23 NOTE — Progress Notes (Signed)
  PROGRESS NOTE MEDICINE TEACHING ATTENDING   Day 2 of stay Patient name: [name] [name]   Medical record number: [id number] Date of birth: [birth year]  I met with [name] [name] with the entire team on our morning rounds. His wife was present in the room at the time. He expressed his wish to go back to the NH. He reported feeling better. He has slight soreness in leg. His vitals have been stable.   Blood pressure 129/68, pulse 80, temperature 99.1 F (37.3 C), temperature source Axillary, resp. rate 18, height _0  (1.727 m), weight 198 lb 14.4 oz (90.22 kg), SpO2 99 %. Examined his thighs bilaterally - no appreciable swelling, no tenderness to palpation on left side. Left calf looks normal, no swelling no tenderness no skin color changes. Right side AKA. Heart - normal rate and rhythm. Lungs - clear - some soft crackles at bases. Abdomen is benign.   We did a CT of left thigh yesterday to evaluate any possibility of hematomas, which was negative for acute pathology. His blood counts are stable since transfusion and the slight drop seen is probably due to equilibration.   Recent Labs Lab 06/21/14 1224 06/22/14 0236 06/22/14 0600 06/22/14 1246 06/23/14 0550  HGB 6.7* 9.5* 9.3* 9.2* 8.9*   I think the patient can be discharged back to the nursing home with follow up.   We have stopped his Aspirin. Dr Chesley Mires touched base with orthopedics about his prophylactic Lovenox - which the patient has to take for about a few more days. After than he can be re-evaluated based on his CBC, to restart Aspirin.   He was FOBT positive, and is also due for repeat colonoscopy - thus an outpatient GI appointment has been set up for 06/26/14.   His CKD - if the current level of creatinine persists might benefit from having a renal follow up. Mostly, he is on medications for which no renal adjustment is required. His Lovenox dosage is appropriate.  I have discussed the care of this patient with my IM team residents -  Dr Chesley Mires and Naaman Plummer. Please see the resident note for details.  [name], [place] 06/23/2014, 11:21 AM.

## 2014-06-23 NOTE — Progress Notes (Signed)
[facility] per MD order. D/C package sent with pt. Along with belongings.    Medication List    STOP taking these medications        aspirin EC 81 MG tablet      TAKE these medications        amLODipine 10 MG tablet  Commonly known as:  NORVASC  Take 1 tablet (10 mg total) by mouth every morning.     calcium citrate-vitamin D 315-200 MG-UNIT per tablet  Commonly known as:  CITRACAL+D  Take 1 tablet by mouth daily.     carvedilol 12.5 MG tablet  Commonly known as:  COREG  Take 1 tablet (12.5 mg total) by mouth 2 (two) times daily with a meal.     enoxaparin 30 MG/0.3ML injection  Commonly known as:  LOVENOX  Inject 0.3 mLs (30 mg total) into the skin daily.     ferrous sulfate 325 (65 FE) MG tablet  Take 1 tablet (325 mg total) by mouth daily with breakfast.     fish oil-omega-3 fatty acids 1000 MG capsule  Take 1 g by mouth daily.     fluticasone 50 MCG/ACT nasal spray  Commonly known as:  FLONASE  Place 2 sprays into both nostrils daily as needed for allergies.     furosemide 40 MG tablet  Commonly known as:  LASIX  Take as needed for a weight gain of more than 5 lbs in a week.     hydroxypropyl methylcellulose / hypromellose 2.5 % ophthalmic solution  Commonly known as:  ISOPTO TEARS / GONIOVISC  Place 2 drops into both eyes 4 (four) times daily as needed for dry eyes.     insulin aspart protamine - aspart (70-30) 100 UNIT/ML FlexPen  Commonly known as:  NOVOLOG MIX 70/30 FLEXPEN  Inject 0.3 mLs (30 Units total) into the skin 2 (two) times daily.     Insulin Pen Needle 30G X 8 MM Misc  Commonly known as:  NOVOFINE  Inject SQ as directed twice daily, use new pen needle for each injection     multivitamin with minerals tablet  Take 1 tablet by mouth daily.     oxyCODONE 5 MG immediate release tablet  Commonly known as:  Oxy IR/ROXICODONE  Take 1 tablet by mouth every 4 hours as needed for mild to moderate pain: Take  2 tablets =10 mg by mouth every 4 hours as needed for moderate to severe pain : Take 1 tablet by mouth every 4 hours as needed for severe/breakthrough pain.     terazosin 10 MG capsule  Commonly known as:  HYTRIN  Take 10 mg by mouth at bedtime.        Patients skin is clean, dry and intact, no evidence of skin break down, dressing CDI to left hip from recent surgery.. IV site discontinued and catheter remains intact. Site without signs and symptoms of complications. Dressing and pressure applied.  Patient transported on a stretcher by non emergent EMS,  no distress noted upon discharge.  Laural BenesJohnson, Amoura Ransier C 06/23/2014 7:43 PM

## 2014-06-23 NOTE — Clinical Social Work Psychosocial (Deleted)
Clinical Social Work Department BRIEF PSYCHOSOCIAL ASSESSMENT 06/23/2014  Patient:  [name] [name],MICHELLE     Account Number:  [id number]     Admit date:  06/19/2014  Clinical Social Worker:  Lavell LusterAMPBELL,Crue Otero BRYANT, LCSWA  Date/Time:  06/23/2014 02:42 PM  Referred by:  Physician  Date Referred:  06/23/2014 Referred for  SNF Placement   Other Referral:   NA   Interview type:  Other - See comment Other interview type:   Patient's significant other [name] [name] interviewed to complete assessment.    PSYCHOSOCIAL DATA Living Status:  SIGNIFICANT OTHER Admitted from facility:   Level of care:   Primary support name:  [name] [name] Primary support relationship to patient:  NONE Degree of support available:   Support is limited.    CURRENT CONCERNS Current Concerns  Post-Acute Placement   Other Concerns:   NA    SOCIAL WORK ASSESSMENT / PLAN CSW spoke with patiet's significant other by phone to complete assessment. CSW received call from [name] [name] regarding concern for the patient's discharge plan. Per [name] the patient is a severe alcoholic. He states the patient drinks about a fifth of liqour per day and has been doing this for 3 years. Prior to her alcohol use, the patient used cocaine and heroin for many years. [name] is very concerned for the patient and fears that she will die if she returns home as she will continue to drink. [name] states he is a Naval architecttruck driver and is not always home with the patient. In addition to drinking a lot, the patient has also been experiencing frequent falls at home along with "shakes".    [name] reports that the patient has lost two brothers to heroin overdose. One of her brothers is still alive and currently lives in [place]. James requests assistance with having the patient placed in an "alcohol rehab". CSW explained that patient will likely be recommended for SNF placement at discharge and that substance abuse treatment facilities will usually not take  patient's if they require the amount of assistance the patient is currently needed. [name] is agreeable to placement in a SNF if this is needed at discharge. CSW explained that placement will likely be very difficult for patient given her excessive ETOH use, and her lack of insurance. CSW explained LOG placement process and how patient [facility] likely be [facility]. [name] is agreeable to this and states, "We need to do whatever is going to save her life." It's clear that [name] is overwhelmed by the patient's excessive ETOH use and is not sure how to help the patient. CSW will wait for PT/OT recommendations and will speak with patient if/when she becomes less disoriented.   Assessment/plan status:  Psychosocial Support/Ongoing Assessment of Needs Other assessment/ plan:   COmplete FL2, Fax, PASRR   Information/referral to community resources:   CSW contact information given.    PATIENT'S/FAMILY'S RESPONSE TO PLAN OF CARE: Patient's significant other [name] would like for the patient to DC to a facility, either SNF or ETOH rehab at discharge so that she will be in a monitored setting. [name] is apprehensive about patient ruturning home as he believes she will die from continued alcohol use. [name] does state that the patient does have a history of leaving the hospital AMA. CSW will continue to follow.       Roddie McBryant Shavonte Zhao MSW, [place]CSWA, [place], [phone]

## 2014-06-26 ENCOUNTER — Non-Acute Institutional Stay (SKILLED_NURSING_FACILITY): Payer: Medicare PPO | Admitting: Registered Nurse

## 2014-06-26 ENCOUNTER — Encounter: Payer: Self-pay | Admitting: Registered Nurse

## 2014-06-26 ENCOUNTER — Other Ambulatory Visit: Payer: Self-pay | Admitting: *Deleted

## 2014-06-26 DIAGNOSIS — Z89611 Acquired absence of right leg above knee: Secondary | ICD-10-CM | POA: Diagnosis not present

## 2014-06-26 DIAGNOSIS — D62 Acute posthemorrhagic anemia: Secondary | ICD-10-CM | POA: Diagnosis not present

## 2014-06-26 DIAGNOSIS — N183 Chronic kidney disease, stage 3 unspecified: Secondary | ICD-10-CM

## 2014-06-26 DIAGNOSIS — E118 Type 2 diabetes mellitus with unspecified complications: Secondary | ICD-10-CM

## 2014-06-26 DIAGNOSIS — I2510 Atherosclerotic heart disease of native coronary artery without angina pectoris: Secondary | ICD-10-CM

## 2014-06-26 DIAGNOSIS — R351 Nocturia: Secondary | ICD-10-CM

## 2014-06-26 DIAGNOSIS — S72002D Fracture of unspecified part of neck of left femur, subsequent encounter for closed fracture with routine healing: Secondary | ICD-10-CM

## 2014-06-26 DIAGNOSIS — N401 Enlarged prostate with lower urinary tract symptoms: Secondary | ICD-10-CM | POA: Diagnosis not present

## 2014-06-26 DIAGNOSIS — K6289 Other specified diseases of anus and rectum: Secondary | ICD-10-CM | POA: Diagnosis not present

## 2014-06-26 DIAGNOSIS — I10 Essential (primary) hypertension: Secondary | ICD-10-CM | POA: Diagnosis not present

## 2014-06-26 MED ORDER — OXYCODONE HCL 5 MG PO TABS
ORAL_TABLET | ORAL | Status: DC
Start: 2014-06-26 — End: 2014-07-28

## 2014-06-26 NOTE — Telephone Encounter (Signed)
[facility] 

## 2014-06-26 NOTE — Progress Notes (Signed)
Patient ID: [name] [name], male   DOB: 09/11/1945, 69 y.o.   MRN: [id number]   Place of Service: [facility]  Allergies  Allergen Reactions  . Amoxicillin Swelling and Other (See Comments)    Puffy eyes and abdominal pain with Amoxicillin PO  . Pravastatin Other (See Comments)    Weakness and fatigue  . Tamsulosin Other (See Comments)    REACTION: dry throat, sweating, blurred vision  . Doxycycline Rash and Other (See Comments)    Questionable drug rxn rash    Code Status: Full Code  Goals of Care: Longevity/STR  Chief Complaint  Patient presents with  . Hospitalization Follow-up    HPI 69 y.o. male with PMH of HTN, DM2 with complications, CKD3, anemia, BPH, s/p AKA, DJD, recent closed displaced hip left femoral neck fracture s/p hemiarthroplasty on 06/14/14 among others is being seen for a follow-up visit post hospital admission from 06/21/14 to 06/23/14 for acute blood loss anemia from GI bleed s/p transfusion of 2 units of PRBCs . He is here for short term rehabilitation and his goal is to return home. Seen in room today. Reported pain is adequately controlled with current regimen. Reported having pain with bowel movement. Denies any other concerns.   Review of Systems Constitutional: Negative for fever, chills, and fatigue. HENT: Negative for ear pain, congestion, and sore throat Eyes: Negative for eye pain and eye discharge Cardiovascular: Negative for chest pain, palpitations, and leg swelling Respiratory: Negative cough, shortness of breath, and wheezing.  Gastrointestinal: Negative for nausea and vomiting. Negative for abdominal pain. Positive for rectal pain with bowel movement Genitourinary: Negative for dysuria Musculoskeletal: Positive of left hip pain.  Neurological: Negative for dizziness and headache Skin: Negative for rash and itchiness.   Psychiatric: Negative for depression   Past Medical History  Diagnosis Date  . Type 2 diabetes mellitus with  neurological manifestations 04/02/2006    Neuropathy of the left foot   . 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 Hyperlipidemia LDL goal < 100 04/02/2006  . Essential hypertension 12/09/2006  . Coronary artery disease 04/02/2006    s/p 2 stents, specifics unknown   . Benign prostatic hypertrophy with nocturia 07/07/2006  . Chronic venous insufficiency 04/12/2010  . Obesity (BMI 30.0-34.9) 01/15/2012  . Constipation 05/25/2009    Intermittent   . Microcytic normochromic anemia 05/27/2006  . Seasonal allergic rhinitis 05/25/2009  . Degenerative joint disease involving multiple joints 02/02/2007  . Internal and external hemorrhoids without complication 08/20/2012    Past Surgical History  Procedure Laterality Date  . Pilonidal cyst excision    . Eye surgery      Laser  . Fracture surgery      Fractured stump  . Total hip arthroplasty Left 06/14/2014    Procedure: HEMI HIP ARTHROPLASTY ANTERIOR APPROACH;  Surgeon: Tarry KosNaiping M Xu, MD;  Location: [facility] OR;  Service: Orthopedics;  Laterality: Left;    History  Substance Use Topics  . Smoking status: Former Smoker    Quit date: 06/16/1968  . Smokeless tobacco: Never Used  . Alcohol Use: No    Family  History  Problem Relation Age of Onset  . Hypertension Mother   . Heart attack Father   . Breast cancer Sister   . Arthritis Sister   . Heart attack Brother   . Diabetes Brother   . Stroke Brother   . Pneumonia Daughter   . Diabetes Brother   . Alcoholism Brother   . Diabetes Brother   . Arthritis Sister     Bilateral knee replacement  . HIV Daughter   .  Hypertension Daughter   . Drug abuse Daughter   . Schizophrenia Daughter   . Hypertension Daughter   . Drug abuse Daughter   . Bipolar disorder Daughter       Medication List       This list is accurate as of: 06/26/14  1:59 PM.  Always use your most recent med list.               amLODipine 10 MG tablet  Commonly known as:  NORVASC  Take 1 tablet (10 mg total) by mouth every morning.     calcium citrate-vitamin D 315-200 MG-UNIT per tablet  Commonly known as:  CITRACAL+D  Take 1 tablet by mouth daily.     carvedilol 12.5 MG tablet  Commonly known as:  COREG  Take 1 tablet (12.5 mg total) by mouth 2 (two) times daily with a meal.     docusate sodium 100 MG capsule  Commonly known as:  COLACE  Take 100 mg by mouth 2 (two) times daily.     enoxaparin 30 MG/0.3ML injection  Commonly known as:  LOVENOX  Inject 0.3 mLs (30 mg total) into the skin daily.     ferrous sulfate 325 (65 FE) MG tablet  Take 1 tablet (325 mg total) by mouth daily with breakfast.     fish oil-omega-3 fatty acids 1000 MG capsule  Take 1 g by mouth daily.     fluticasone 50 MCG/ACT nasal spray  Commonly known as:  FLONASE  Place 2 sprays into both nostrils daily as needed for allergies.     furosemide 40 MG tablet  Commonly known as:  LASIX  Take as needed for a weight gain of more than 5 lbs in a week.     hydroxypropyl methylcellulose / hypromellose 2.5 % ophthalmic solution  Commonly known as:  ISOPTO TEARS / GONIOVISC  Place 2 drops into both eyes 4 (four) times daily as needed for dry eyes.     insulin aspart protamine - aspart (70-30) 100 UNIT/ML FlexPen  Commonly known as:  NOVOLOG MIX 70/30 FLEXPEN  Inject 0.3 mLs (30 Units total) into the skin 2 (two) times daily.     Insulin Pen Needle 30G X 8 MM Misc  Commonly known as:  NOVOFINE  Inject SQ as directed twice daily, use new pen needle for each injection     multivitamin with minerals tablet  Take 1 tablet by mouth daily.      oxyCODONE 5 MG immediate release tablet  Commonly known as:  Oxy IR/ROXICODONE  Take 1 tablet by mouth every 4 hours as needed for mild to moderate pain: Take 2 tablets =10 mg by mouth every 4 hours as needed for moderate to severe pain : Take 1 tablet by mouth every 4 hours as needed for severe/breakthrough pain.     sennosides-docusate sodium 8.6-50 MG tablet  Commonly known as:  SENOKOT-S  Take 2 tablets by mouth daily.     terazosin 10 MG capsule  Commonly  known as:  HYTRIN  Take 10 mg by mouth at bedtime.        Physical Exam  BP 143/76 mmHg  Pulse 73  Temp(Src) 98.3 F (36.8 C)  Resp 16  Ht  (1.651 m)  Wt 209 lb (94.802 kg)  BMI 34.78 kg/m2  Constitutional: WDWN elderly male in no acute distress. Conversant and pleasant HEENT: Normocephalic and atraumatic. PERRL. EOM intact. No icterus. No nasal discharge or sinus tenderness. Oral mucosa moist. Posterior pharynx clear of any exudate or lesions.  Neck: Supple and nontender. No lymphadenopathy, masses, or thyromegaly. No JVD or carotid bruits. Cardiac: Normal S1, S2. RRR without appreciable murmurs, rubs, or gallops. Left distal pulse intact with trace pitting edema Lungs: No respiratory distress. Breath sounds clear bilaterally without rales, rhonchi, or wheezes. Abdomen: Audible bowel sounds in all quadrants. Soft, nontender, nondistended. No palpable mass.  Musculoskeletal: able to move all extremities.  S/p AKA with prosthetic leg in place.Surgical dressing intact. Limited ROM of LLE with generalized weakness Skin: Warm and dry.  Neurological: Alert and oriented to self. No focal deficits.  Psychiatric: Judgment and insight adequate. Appropriate mood and affect.   Labs Reviewed  CBC Latest Ref Rng 06/23/2014 06/22/2014 06/22/2014  WBC 4.0 - 10.5 K/uL 8.9 9.7 8.0  Hemoglobin 13.0 - 17.0 g/dL 8.1(X) 9.1(Y) 7.8(G)  Hematocrit 39.0 - 52.0 % 26.1(L) 27.3(L) 28.2(L)  Platelets 150 - 400 K/uL 229 206 199    CMP Latest  Ref Rng 06/22/2014 06/21/2014 06/16/2014  Glucose 70 - 99 mg/dL 956(O) 87 130(Q)  BUN 6 - 23 mg/dL 65(H) 84(O) 96(E)  Creatinine 0.50 - 1.35 mg/dL 9.52(W) 4.13(K) 4.40(N)  Sodium 135 - 145 mmol/L 140 138 139  Potassium 3.5 - 5.1 mmol/L 4.6 4.3 4.2  Chloride 96 - 112 mmol/L 115(H) 115(H) 113(H)  CO2 19 - 32 mmol/L 16(L) 17(L) 19  Calcium 8.4 - 10.5 mg/dL 8.1(L) 7.7(L) 7.0(L)  Total Protein 6.0 - 8.3 g/dL - 5.9(L) -  Total Bilirubin 0.3 - 1.2 mg/dL - 0.5 -  Alkaline Phos 39 - 117 U/L - 85 -  AST 0 - 37 U/L - 31 -  ALT 0 - 53 U/L - 23 -    Lab Results  Component Value Date   HGBA1C 6.3 05/26/2014    Lab Results  Component Value Date   TSH 1.983 02/10/2014    Lipid Panel     Component Value Date/Time   CHOL 69 09/15/2013 1115   TRIG 267* 09/15/2013 1115   HDL 15* 09/15/2013 1115   CHOLHDL 4.6 09/15/2013 1115   VLDL 53* 09/15/2013 1115   LDLCALC 1 09/15/2013 1115   LDLDIRECT 42.1 09/19/2008 0000   Diagnostic Studies Reviewed 06/16/14: CXR  No acute pulmonary abnormalities identified. Gaseous distention of stomach.  06/14/14: Pelvis xray: Well aligned left hip prosthesis.  Assessment & Plan 1. Essential hypertension Slightly elevated. Continue coreg 12.5mg  twice daily, norvasc  daily, and terazosin  daily. Continue to monitor   2. Closed displaced fracture of left femoral neck, with routine healing, subsequent encounter S/p left hip hemiarthroplasty on 06/14/14. Pain is adequately controlled with current regimen. Continue oxycodone  IR 1-2 tabs every 4 hours as needed for pain. Continue lovenox /0.27mL injection daily for DVT prophylaxis. Continue to work with PT/OT for gait/strength/balance training to restore/maximize function. Continue to f/u wit ortho.   3. Benign prostatic hypertrophy with nocturia Continue terazosin  daily.   4. Coronary artery disease involving native coronary artery of native heart  without angina pectoris Denies any chest pain.  Continue current BP meds. LDL at goal. Monitor clinically  5. 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 Fall risk precautions.   6. Type 2 diabetes mellitus with complications Last a1c 6.3. Continue novolog 70/30 30 units twice daily. Currently off ACEI secondary to impaired renal function. Continue to monitor cbgs and his status.  7. CKD (chronic kidney disease) stage 3, GFR 30-59 ml/min Avoid nephrotoxic agents, particularly NSAIDs. Continue to monitor renal function  8. Acute Blood Loss Anemia Hemoccult positive during hospitalization. Most likely in setting of GI bleed, CKD, and post-op. Recent hbg 8.9 s/p 2 units of PRBs. Continue to f/u with Dr. Elnoria Howard, GI as scheduled. Continue to monitor h&h  9. Rectal pain With bowel movement. Questionable internal hemorrhoids. Will add colace  twice daily with sennakot s 8.6/50mg  2 tabs daily. Continue oxycodone  1-2 tabs every four as needed for pain. Continue to f/u with GI as scheuduled.    Diagnostic Studies/Labs Ordered: cbc in 1 week  Time spent: 35 minutes on care coordination   Family/Staff Communication Plan of care discussed with patient and nursing staff. Patient and nursing staff verbalized understanding and agree with plan of care. No additional questions or concerns reported.    Loura Back, MSN, AGNP-C [facility] [address] [place], [place] [address] [phone] [8am-5pm] After hours: [phone]

## 2014-06-29 ENCOUNTER — Non-Acute Institutional Stay (SKILLED_NURSING_FACILITY): Payer: Medicare PPO | Admitting: Internal Medicine

## 2014-06-29 DIAGNOSIS — R351 Nocturia: Secondary | ICD-10-CM | POA: Diagnosis not present

## 2014-06-29 DIAGNOSIS — E1122 Type 2 diabetes mellitus with diabetic chronic kidney disease: Secondary | ICD-10-CM | POA: Diagnosis not present

## 2014-06-29 DIAGNOSIS — R5381 Other malaise: Secondary | ICD-10-CM | POA: Diagnosis not present

## 2014-06-29 DIAGNOSIS — N183 Chronic kidney disease, stage 3 unspecified: Secondary | ICD-10-CM

## 2014-06-29 DIAGNOSIS — I10 Essential (primary) hypertension: Secondary | ICD-10-CM | POA: Diagnosis not present

## 2014-06-29 DIAGNOSIS — E1159 Type 2 diabetes mellitus with other circulatory complications: Secondary | ICD-10-CM

## 2014-06-29 DIAGNOSIS — N401 Enlarged prostate with lower urinary tract symptoms: Secondary | ICD-10-CM | POA: Diagnosis not present

## 2014-06-29 DIAGNOSIS — K5900 Constipation, unspecified: Secondary | ICD-10-CM

## 2014-06-29 DIAGNOSIS — S72002S Fracture of unspecified part of neck of left femur, sequela: Secondary | ICD-10-CM | POA: Diagnosis not present

## 2014-06-29 DIAGNOSIS — D62 Acute posthemorrhagic anemia: Secondary | ICD-10-CM

## 2014-06-29 DIAGNOSIS — I739 Peripheral vascular disease, unspecified: Secondary | ICD-10-CM | POA: Diagnosis not present

## 2014-06-29 DIAGNOSIS — E46 Unspecified protein-calorie malnutrition: Secondary | ICD-10-CM | POA: Diagnosis not present

## 2014-06-29 DIAGNOSIS — E1151 Type 2 diabetes mellitus with diabetic peripheral angiopathy without gangrene: Secondary | ICD-10-CM

## 2014-06-29 NOTE — Progress Notes (Signed)
Patient ID: [name] [name], male   DOB: [birth year], 69 y.o.   MRN: [id number]     Facility: [facility]    PCP: Rocco Serene, MD  Code Status: full code  Allergies  Allergen Reactions  . Amoxicillin Swelling and Other (See Comments)    Puffy eyes and abdominal pain with Amoxicillin PO  . Pravastatin Other (See Comments)    Weakness and fatigue  . Tamsulosin Other (See Comments)    REACTION: dry throat, sweating, blurred vision  . Doxycycline Rash and Other (See Comments)    Questionable drug rxn rash    Chief Complaint  Patient presents with  . New Admit To SNF     HPI:  69 year old patient is here for short term rehabilitation post hospital admission from 06/13/14-06/18/14 with closed displaced fracture of left femoral neck and from 06/21/14-06/23/14 with blood loss anemia from gi bleed. He underwent left hip hemiarthroplasty during his first admission and received 2 u prbc during second admission to the hospital. His aspirin was stopped in the hospital.  He has PMH of HTN, DM with PVD, anemia of chronic disease, CKD stage 3, gerd among others. He is seen in his room today with his wife at bedside. He denies any concerns. His pain is under control and he has been working with therapy team.  Review of Systems:  Constitutional: Negative for fever, chills, diaphoresis.  HENT: Negative for headache, congestion, nasal discharge, hearing loss, earache, sore throat, difficulty swallowing.   Eyes: Negative for eye pain, blurred vision, double vision and discharge.  Respiratory: Negative for cough, shortness of breath and wheezing.   Cardiovascular: Negative for chest pain, palpitations, leg swelling.  Gastrointestinal: Negative for heartburn, nausea, vomiting, abdominal pain, melena, rectal bleed, diarrhea and constipation. appetite is good. Genitourinary: Negative for dysuria Musculoskeletal: Negative for back pain, falls. Has right leg prosthesis Skin:  Negative for itching, rash.  Neurological: Negative for dizziness, tingling, focal weakness Psychiatric/Behavioral: Negative for depression  Past Medical History  Diagnosis Date  . 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 Hyperlipidemia LDL goal < 100 04/02/2006  . Essential hypertension 12/09/2006  . Coronary artery disease 04/02/2006    s/p 2 stents, specifics unknown   . Benign prostatic hypertrophy with nocturia 07/07/2006  . Chronic venous insufficiency 04/12/2010  . Obesity (BMI 30.0-34.9) 01/15/2012  . Constipation 05/25/2009    Intermittent   . Microcytic normochromic anemia 05/27/2006  . Seasonal allergic rhinitis 05/25/2009  . Degenerative joint disease involving multiple joints 02/02/2007  . Internal and external hemorrhoids without complication 08/20/2012   Past Surgical History  Procedure Laterality Date  . Pilonidal cyst excision    . Eye surgery      Laser  . Fracture surgery      Fractured stump  . Total hip arthroplasty Left 06/14/2014    Procedure: HEMI HIP ARTHROPLASTY ANTERIOR APPROACH;  Surgeon: Tarry Kos, MD;  Location: [facility] OR;  Service: Orthopedics;  Laterality: Left;  Social History:   reports that he quit smoking about 46 years ago. He has never used smokeless tobacco. He reports that he does not drink alcohol or use illicit drugs.  Family History  Problem Relation Age of Onset  . Hypertension Mother   . Heart attack Father   . Breast cancer Sister   . Arthritis Sister   .  Heart attack Brother   . Diabetes Brother   . Stroke Brother   . Pneumonia Daughter   . Diabetes Brother   . Alcoholism Brother   . Diabetes Brother   . Arthritis Sister     Bilateral knee replacement  . HIV Daughter   . Hypertension Daughter   . Drug abuse Daughter   . Schizophrenia Daughter   . Hypertension Daughter   . Drug abuse Daughter   . Bipolar disorder Daughter     Medications: Patient's Medications  New Prescriptions   No medications on file  Previous Medications   AMLODIPINE (NORVASC) 10 MG TABLET    Take 1 tablet (10 mg total) by mouth every morning.   CALCIUM CITRATE-VITAMIN D (CITRACAL+D) 315-200 MG-UNIT PER TABLET    Take 1 tablet by mouth daily.    CARVEDILOL (COREG) 12.5 MG TABLET    Take 1 tablet (12.5 mg total) by mouth 2 (two) times daily with a meal.   DOCUSATE SODIUM (COLACE) 100 MG CAPSULE    Take 100 mg by mouth 2 (two) times daily.   ENOXAPARIN (LOVENOX) 30 MG/0.3ML INJECTION    Inject 0.3 mLs (30 mg total) into the skin daily.   FERROUS SULFATE 325 (65 FE) MG TABLET    Take 1 tablet (325 mg total) by mouth daily with breakfast.   FISH OIL-OMEGA-3 FATTY ACIDS 1000 MG CAPSULE    Take 1 g by mouth daily.   FLUTICASONE (FLONASE) 50 MCG/ACT NASAL SPRAY    Place 2 sprays into both nostrils daily as needed for allergies.   FUROSEMIDE (LASIX) 40 MG TABLET    Take as needed for a weight gain of more than 5 lbs in a week.   HYDROXYPROPYL METHYLCELLULOSE (ISOPTO TEARS) 2.5 % OPHTHALMIC SOLUTION    Place 2 drops into both eyes 4 (four) times daily as needed for dry eyes.   INSULIN ASPART PROTAMINE - ASPART (NOVOLOG MIX 70/30 FLEXPEN) (70-30) 100 UNIT/ML FLEXPEN    Inject 0.3 mLs (30 Units total) into the skin 2 (two) times daily.   INSULIN PEN NEEDLE (NOVOFINE) 30G X 8 MM MISC    Inject SQ as directed twice daily, use new pen needle for each injection   MULTIPLE VITAMINS-MINERALS (MULTIVITAMIN WITH MINERALS) TABLET    Take 1 tablet by mouth daily.   OXYCODONE (OXY  IR/ROXICODONE) 5 MG IMMEDIATE RELEASE TABLET    Take 1 tablet by mouth every 4 hours as needed for mild to moderate pain: Take 2 tablets =10 mg by mouth every 4 hours as needed for moderate to severe pain : Take 1 tablet by mouth every 4 hours as needed for severe/breakthrough pain.   SENNOSIDES-DOCUSATE SODIUM (SENOKOT-S) 8.6-50 MG TABLET    Take 2 tablets by mouth daily.   TERAZOSIN (HYTRIN) 10 MG CAPSULE    Take 10 mg by mouth at bedtime.  Modified Medications   No medications on file  Discontinued Medications   No medications on file     Physical Exam: Filed Vitals:   06/29/14 1025  BP: 126/66  Pulse: 81  Temp: 99.5 F (37.5 C)  Resp:  18  SpO2: 95%    General- elderly male, in no acute distress Head- normocephalic, atraumatic Throat- moist mucus membrane Eyes- PERRLA, EOMI, no pallor, no icterus, no discharge, normal conjunctiva, normal sclera Neck- no cervical lymphadenopathy Cardiovascular- normal s1,s2, no murmurs, palpable left dorsalis pedis and both radial pulses, no leg edema Respiratory- bilateral clear to auscultation, no wheeze, no rhonchi, no crackles, no use of accessory muscles Abdomen- bowel sounds present, soft, non tender Musculoskeletal- able to move all 4 extremities, right AKA, left leg ROM limited at hip joint.  Neurological- no focal deficit Skin- warm and dry, left hip surgical incision with staples, some serous drainage, mild erythema on peri-incision area. No signs of infection, dressing clean and dry Psychiatry- alert and oriented to person, place and time, normal mood and affect   Labs reviewed: Basic Metabolic Panel:  Recent Labs  16/10/96 0828 06/21/14 1224 06/22/14 0600  NA 139 138 140  K 4.2 4.3 4.6  CL 113* 115* 115*  CO2 19 17* 16*  GLUCOSE 145* 87 205*  BUN 55* 63* 56*  CREATININE 2.37* 2.27* 2.23*  CALCIUM 7.0* 7.7* 8.1*  MG  --   --  1.7  PHOS  --   --  4.7*   Liver Function Tests:  Recent Labs  06/13/14 2210  06/14/14 0420 06/21/14 1224  AST 66* 60* 31  ALT 55* 52 23  ALKPHOS 121* 118* 85  BILITOT 0.7 0.9 0.5  PROT 7.8 6.9 5.9*  ALBUMIN 3.2* 2.9* 2.1*    Recent Labs  06/21/14 1224  LIPASE 31   No results for input(s): AMMONIA in the last 8760 hours. CBC:  Recent Labs  06/13/14 2210  06/15/14 0708  06/21/14 1224  06/22/14 0600 06/22/14 1246 06/23/14 0550  WBC 7.3  < > 8.9  < > 9.3  < > 8.0 9.7 8.9  NEUTROABS 4.8  --  6.4  --  6.9  --   --   --   --   HGB 10.6*  < > 8.3*  < > 6.7*  < > 9.3* 9.2* 8.9*  HCT 32.8*  < > 25.5*  < > 20.2*  < > 28.2* 27.3* 26.1*  MCV 72.9*  < > 73.1*  < > 72.7*  < > 73.6* 74.0* 72.7*  PLT 122*  < > 95*  < > 202  < > 199 206 229  < > = values in this interval not displayed. Cardiac Enzymes:  Recent Labs  06/21/14 2048  TROPONINI 0.03   BNP: Invalid input(s): POCBNP CBG:  Recent Labs  06/22/14 2219 06/23/14 0809 06/23/14 1229  GLUCAP 181* 194* 225*    Assessment/Plan  Physical deconditioning Will have him work with physical therapy and occupational therapy team to help with gait training and muscle strengthening exercises.fall precautions. Skin care. Encourage to be out of bed.   Blood loss anemia S/p 2 u prbc transfusion. Has been seen by GI Dr Elnoria Howard, no notes for review, will need records. Monitor h&h, aspirin on hold for now  Left femoral neck fracture S/p left hip hemiarhtroplasty, has f/u with orthopedics. Continue to work with therapy team. Continue wound dressing. Continue oxyIR 5 mg 1-2 tab q4h prn for pain and lovenox for dvt prophylaxis. After completion of lovenox, to resume baby aspirin once a day   Constipation Stable, continue senna s and colace  Protein calorie malnutrition Low albumin, will get dietary consult to assess for need of protein supplement  Type 2 diabetes mellitus  Last  a1c 6.3. Continue novolog 70/30 30 units twice daily. Monitor cbg  Essential hypertension Stable reading. Continue coreg 12.5mg   twice daily, norvasc  daily and terazosin  daily.    ckd stage 3 Monitor clinically  PAD S/p right AKA. Has prosthetics. Fall precautions. Aspirin currently on hold, to be resumed after completion of lovenox  BPH Continue terazosin  daily.    Goals of care: short term rehabilitation   Labs/tests ordered: cbc   Family/ staff Communication: reviewed care plan with patient and nursing supervisor    Oneal Grout, MD  [facility] [phone] (Monday-Friday 8 am - 5 pm) [phone] (afterhours)

## 2014-07-03 LAB — CBC AND DIFFERENTIAL
HCT: 25 % — AB (ref 41–53)
Hemoglobin: 8.4 g/dL — AB (ref 13.5–17.5)
Platelets: 336 10*3/uL (ref 150–399)
WBC: 5.3 10^3/mL

## 2014-07-04 ENCOUNTER — Encounter: Payer: Self-pay | Admitting: Registered Nurse

## 2014-07-04 ENCOUNTER — Non-Acute Institutional Stay: Payer: Medicare PPO | Admitting: Registered Nurse

## 2014-07-04 DIAGNOSIS — E1151 Type 2 diabetes mellitus with diabetic peripheral angiopathy without gangrene: Secondary | ICD-10-CM

## 2014-07-04 DIAGNOSIS — T814XXD Infection following a procedure, subsequent encounter: Secondary | ICD-10-CM

## 2014-07-04 DIAGNOSIS — S72002S Fracture of unspecified part of neck of left femur, sequela: Secondary | ICD-10-CM | POA: Diagnosis not present

## 2014-07-04 DIAGNOSIS — I10 Essential (primary) hypertension: Secondary | ICD-10-CM

## 2014-07-04 DIAGNOSIS — E46 Unspecified protein-calorie malnutrition: Secondary | ICD-10-CM

## 2014-07-04 DIAGNOSIS — N401 Enlarged prostate with lower urinary tract symptoms: Secondary | ICD-10-CM | POA: Diagnosis not present

## 2014-07-04 DIAGNOSIS — N183 Chronic kidney disease, stage 3 unspecified: Secondary | ICD-10-CM

## 2014-07-04 DIAGNOSIS — IMO0001 Reserved for inherently not codable concepts without codable children: Secondary | ICD-10-CM

## 2014-07-04 DIAGNOSIS — E1159 Type 2 diabetes mellitus with other circulatory complications: Secondary | ICD-10-CM

## 2014-07-04 DIAGNOSIS — R351 Nocturia: Secondary | ICD-10-CM | POA: Diagnosis not present

## 2014-07-04 DIAGNOSIS — I739 Peripheral vascular disease, unspecified: Secondary | ICD-10-CM | POA: Diagnosis not present

## 2014-07-04 DIAGNOSIS — Z89611 Acquired absence of right leg above knee: Secondary | ICD-10-CM

## 2014-07-04 DIAGNOSIS — I2510 Atherosclerotic heart disease of native coronary artery without angina pectoris: Secondary | ICD-10-CM

## 2014-07-04 DIAGNOSIS — D62 Acute posthemorrhagic anemia: Secondary | ICD-10-CM

## 2014-07-04 NOTE — Progress Notes (Signed)
Patient ID: [name] [name], male   DOB: 03/28/1945, 69 y.o.   MRN: [id number]   Place of Service: [facility]  Allergies  Allergen Reactions  . Amoxicillin Swelling and Other (See Comments)    Puffy eyes and abdominal pain with Amoxicillin PO  . Pravastatin Other (See Comments)    Weakness and fatigue  . Tamsulosin Other (See Comments)    REACTION: dry throat, sweating, blurred vision  . Doxycycline Rash and Other (See Comments)    Questionable drug rxn rash    Code Status: Full Code  Goals of Care: Longevity/STR  Chief Complaint  Patient presents with  . Discharge Note    HPI 69 y.o. male with PMH of HTN, DM2 with complications, CKD3, anemia, BPH, s/p AKA, DJD among others is being seen for a discharge visit. He was here for short term rehab post hospital admission from from 06/13/14 to 06/18/14 for cosed displaced fracture of left femoral neck s/p hemiarthroplasty of left hip and from 06/21/14 to 06/23/14 for acute blood loss anemia from GI bleed s/p transfusion of 2 units of PRBCs. He continues to work with therapy, but patient and wife reported wanting to be discharged home today. He will be discharged home with [facility] PT/OT and the rest of his abx for surgical wound infection. Seen in room today. Denies any concerns. Reported pain is adequately controlled with current regimen. No issues with constipation or bleeding.   Review of Systems Constitutional: Negative for fever and chills HENT: Negative for ear pain, congestion, and sore throat Eyes: Negative for eye pain and eye discharge Cardiovascular: Negative for chest pain, palpitations, and leg swelling Respiratory: Negative cough, shortness of breath, and wheezing.  Gastrointestinal: Negative for nausea and vomiting. Negative for abdominal pain, constipation or diarrhea Genitourinary: Negative for dysuria Musculoskeletal: Positive of left hip pain.  Neurological: Negative for dizziness and headache Skin: Negative for  rash and itchiness.   Psychiatric: Negative for depression   Past Medical History  Diagnosis Date  . 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 Hyperlipidemia LDL goal < 100 04/02/2006  . Essential hypertension 12/09/2006  . Coronary artery disease 04/02/2006    s/p 2 stents, specifics unknown   . Benign prostatic hypertrophy with nocturia 07/07/2006  . Chronic venous insufficiency 04/12/2010  . Obesity (BMI 30.0-34.9) 01/15/2012  . Constipation 05/25/2009    Intermittent   . Microcytic normochromic anemia 05/27/2006  . Seasonal allergic rhinitis 05/25/2009  . Degenerative joint disease involving multiple joints 02/02/2007  . Internal and external hemorrhoids without complication 08/20/2012    Past Surgical History  Procedure Laterality Date  . Pilonidal cyst excision    . Eye surgery      Laser  . Fracture surgery      Fractured stump  . Total hip arthroplasty Left 06/14/2014    Procedure: HEMI HIP ARTHROPLASTY ANTERIOR APPROACH;  Surgeon: Tarry Kos, MD;  Location: [facility] OR;  Service: Orthopedics;  Laterality: Left;    History  Substance Use  Topics  . Smoking status: Former Smoker    Quit date: 06/16/1968  . Smokeless tobacco: Never Used  . Alcohol Use: No    Family History  Problem Relation Age of Onset  . Hypertension Mother   . Heart attack Father   . Breast cancer Sister   . Arthritis Sister   . Heart attack Brother   . Diabetes Brother   . Stroke Brother   . Pneumonia Daughter   .  Diabetes Brother   . Alcoholism Brother   . Diabetes Brother   . Arthritis Sister     Bilateral knee replacement  . HIV Daughter   . Hypertension Daughter   . Drug abuse Daughter   . Schizophrenia Daughter   . Hypertension Daughter   . Drug abuse Daughter   . Bipolar disorder Daughter       Medication List       This list is accurate as of: 07/04/14 11:40 AM.  Always use your most recent med list.               amLODipine 10 MG tablet  Commonly known as:  NORVASC  Take 1 tablet (10 mg total) by mouth every morning.     aspirin 325 MG EC tablet  Take 325 mg by mouth daily.     calcium citrate-vitamin D 315-200 MG-UNIT per tablet  Commonly known as:  CITRACAL+D  Take 1 tablet by mouth daily.     carvedilol 12.5 MG tablet  Commonly known as:  COREG  Take 1 tablet (12.5 mg total) by mouth 2 (two) times daily with a meal.     cephALEXin 500 MG capsule  Commonly known as:  KEFLEX  Take 500 mg by mouth 4 (four) times daily.     docusate sodium 100 MG capsule  Commonly known as:  COLACE  Take 100 mg by mouth 2 (two) times daily.     ferrous sulfate 325 (65 FE) MG tablet  Take 1 tablet (325 mg total) by mouth daily with breakfast.     fish oil-omega-3 fatty acids 1000 MG capsule  Take 1 g by mouth daily.     fluticasone 50 MCG/ACT nasal spray  Commonly known as:  FLONASE  Place 2 sprays into both nostrils daily as needed for allergies.     furosemide 40 MG tablet  Commonly known as:  LASIX  Take as needed for a weight gain of more than 5 lbs in a week.     hydroxypropyl methylcellulose / hypromellose 2.5 % ophthalmic solution  Commonly known as:  ISOPTO TEARS / GONIOVISC  Place 2 drops into both eyes 4 (four) times daily as needed for dry eyes.     insulin aspart protamine - aspart (70-30) 100 UNIT/ML FlexPen  Commonly known as:  NOVOLOG MIX 70/30 FLEXPEN  Inject 0.3 mLs (30 Units total) into the skin 2 (two) times daily.     Insulin Pen Needle 30G X 8 MM  Misc  Commonly known as:  NOVOFINE  Inject SQ as directed twice daily, use new pen needle for each injection     multivitamin with minerals tablet  Take 1 tablet by mouth daily.     oxyCODONE 5 MG immediate release tablet  Commonly known as:  Oxy IR/ROXICODONE  Take 1 tablet by mouth every 4 hours as needed for mild to moderate pain: Take 2 tablets =10 mg by mouth every 4 hours as needed for moderate to severe pain : Take 1 tablet  by mouth every 4 hours as needed for severe/breakthrough pain.     sennosides-docusate sodium 8.6-50 MG tablet  Commonly known as:  SENOKOT-S  Take 2 tablets by mouth daily.     terazosin 10 MG capsule  Commonly known as:  HYTRIN  Take 10 mg by mouth at bedtime.        Physical Exam  BP 111/63 mmHg  Pulse 77  Temp(Src) 98.7 F (37.1 C)  Resp 16  Wt 185 lb 11.2 oz (84.233 kg)  SpO2 96%  Constitutional: WDWN elderly male in no acute distress. Conversant and pleasant HEENT: Normocephalic and atraumatic. PERRL. EOM intact. No icterus. No nasal discharge or sinus tenderness. Oral mucosa moist. Posterior pharynx clear of any exudate or lesions.  Neck: Supple and nontender. No lymphadenopathy, masses, or thyromegaly. No JVD or carotid bruits. Cardiac: Normal S1, S2. RRR without appreciable murmurs, rubs, or gallops. Left distal pulse intact with trace pitting edema Lungs: No respiratory distress. Breath sounds clear bilaterally without rales, rhonchi, or wheezes. Abdomen: Audible bowel sounds in all quadrants. Soft, nontender, nondistended. No palpable mass.  Musculoskeletal: able to move all extremities.  S/p AKA. Surgical incision erythematous with ~50% yellow eschar and firmly adherent steri strips.  Skin: Warm and dry.  Neurological: Alert and oriented to self. No focal deficits.  Psychiatric: Judgment and insight adequate. Appropriate mood and affect.   Labs Reviewed  CBC Latest Ref Rng 07/03/2014 06/23/2014 06/22/2014  WBC - 5.3 8.9 9.7  Hemoglobin  13.5 - 17.5 g/dL 8.4(A) 8.9(L) 9.2(L)  Hematocrit 41 - 53 % 25(A) 26.1(L) 27.3(L)  Platelets 150 - 399 K/L 336 229 206    CMP Latest Ref Rng 06/22/2014 06/21/2014 06/16/2014  Glucose 70 - 99 mg/dL 960(A) 87 540(J)  BUN 6 - 23 mg/dL 81(X) 91(Y) 78(G)  Creatinine 0.50 - 1.35 mg/dL 9.56(O) 1.30(Q) 6.57(Q)  Sodium 135 - 145 mmol/L 140 138 139  Potassium 3.5 - 5.1 mmol/L 4.6 4.3 4.2  Chloride 96 - 112 mmol/L 115(H) 115(H) 113(H)  CO2 19 - 32 mmol/L 16(L) 17(L) 19  Calcium 8.4 - 10.5 mg/dL 8.1(L) 7.7(L) 7.0(L)  Total Protein 6.0 - 8.3 g/dL - 5.9(L) -  Total Bilirubin 0.3 - 1.2 mg/dL - 0.5 -  Alkaline Phos 39 - 117 U/L - 85 -  AST 0 - 37 U/L - 31 -  ALT 0 - 53 U/L - 23 -    Lab Results  Component Value Date   HGBA1C 6.3 05/26/2014    Lab Results  Component Value Date   TSH 1.983 02/10/2014    Lipid Panel     Component Value Date/Time   CHOL 69 09/15/2013 1115   TRIG 267* 09/15/2013 1115   HDL 15* 09/15/2013 1115   CHOLHDL 4.6 09/15/2013 1115   VLDL 53* 09/15/2013 1115   LDLCALC 1 09/15/2013 1115   LDLDIRECT 42.1 09/19/2008 0000   Diagnostic Studies Reviewed 06/16/14: CXR  No acute pulmonary abnormalities identified. Gaseous distention of stomach.  06/14/14: Pelvis xray: Well aligned left hip prosthesis.  Assessment & Plan 1. Essential hypertension Stable. Continue coreg 12.5mg  twice daily, norvasc  daily, and terazosin  daily. Continue to f/u with p  2. Closed displaced fracture of left femoral neck, with routine healing, subsequent encounter S/p left hip hemiarthroplasty on 06/14/14. Pain is adequately controlled with current regimen. Continue oxycodone  IR 1-2 tabs every 4 hours as needed for pain. Continue aspirin  EC daily x 4 weeks for DVT prophylaxis. Continue to work with [facility]  PT/OT for gait/strength/balance training to restore/maximize function. Continue to f/u with ortho.   3. Benign prostatic hypertrophy with nocturia Continue terazosin  daily  and f/u with PCP  4. Coronary artery disease involving native coronary artery of native heart without angina pectoris Denies any chest pain. Continue current BP meds. LDL at goal.   5. S/P AKA (above knee amputation) unilateral, right Wears Right prosthesis. Fall risk precautions.   6. Type 2 diabetes mellitus with complications Last a1c 6.3. Continue novolog 70/30 30 units twice daily. Currently off ACEI secondary to impaired renal function. Continue to monitor cbgs at least twice daily and f/u with PCP  7. CKD (chronic kidney disease) stage 3, GFR 30-59 ml/min Avoid nephrotoxic agents, particularly NSAIDs. PCP to monitor renal function  8. Acute Blood Loss Anemia Hemoccult positive during hospitalization. Most likely in setting of GI bleed, CKD, and post-op. Last hbg 8.4 s/p 2 units of PRBs. Continue to f/u with Dr. Elnoria Howard, GI for further evaluation. PCP to monitor h&h  9. Surgical wound infection Continue and complete keflex  four times daily 2 x weeks. Recommended probiotics. Continue to f/u with ortho. Will be discharged home with the rest of keflex.   10. Protein Calorie Malnutrition Continue current diet. Recommended dietary supplement (e.g Ensure, Boost, etc.)   Diagnostic Studies/Labs Ordered: cbc in 1 week  Home health services: PT/OT DME required: None PCP follow-up: Please schedule f/u appt with PCP w/in 1-2 weeks of discharge from skilled nursing facility  30-day supply of prescription medications provided (#30 oxycodone  IR)  Time spent: 35 minutes on care coordination and counseling   Family/Staff Communication Plan of care discussed with patient, wife, and nursing staff. Patient, wife, and nursing staff verbalized understanding and agree with plan of care. No additional questions or concerns reported.    Loura Back, MSN, AGNP-C [facility] [address] [place], [place] [address] [phone] [8am-5pm] After hours: [phone]

## 2014-07-07 ENCOUNTER — Telehealth: Payer: Self-pay | Admitting: *Deleted

## 2014-07-07 NOTE — Telephone Encounter (Signed)
Please provide [facility] OT a verbal order to provide OT Home Care after left hip fracture and replacement.  Thank You.

## 2014-07-07 NOTE — Telephone Encounter (Signed)
Aware of Dr Josem KaufmannKlima response - OK.

## 2014-07-07 NOTE — Telephone Encounter (Signed)
Radovan with [facility] OT [phone] - called to get a VO for OT. Stanton KidneyDebra Teon Hudnall RN 07/07/14 1:55PM

## 2014-07-10 NOTE — Progress Notes (Signed)
HPI: FU coronary disease; status post PCI of his right coronary artery with drug- eluting stent in February 2006. An echocardiogram was also performed in December 2007, that showed normal LV function. An abdominal ultrasound in August 2006 showed no aneurysm. Carotid Dopplers in August of 2006 showed probable 0-39% stenosis. Nuclear study April 2015 showed ejection fraction 61% and normal perfusion. Since I last saw him,   Current Outpatient Prescriptions  Medication Sig Dispense Refill  . amLODipine (NORVASC) 10 MG tablet Take 1 tablet (10 mg total) by mouth every morning. 30 tablet 11  . aspirin 325 MG EC tablet Take 325 mg by mouth daily.    . calcium citrate-vitamin D (CITRACAL+D) 315-200 MG-UNIT per tablet Take 1 tablet by mouth daily.     . carvedilol (COREG) 12.5 MG tablet Take 1 tablet (12.5 mg total) by mouth 2 (two) times daily with a meal. 180 tablet 3  . cephALEXin (KEFLEX) 500 MG capsule Take 500 mg by mouth 4 (four) times daily.    [name] docusate sodium (COLACE) 100 MG capsule Take 100 mg by mouth 2 (two) times daily.    . ferrous sulfate 325 (65 FE) MG tablet Take 1 tablet (325 mg total) by mouth daily with breakfast. 30 tablet 0  . fish oil-omega-3 fatty acids 1000 MG capsule Take 1 g by mouth daily.    . fluticasone (FLONASE) 50 MCG/ACT nasal spray Place 2 sprays into both nostrils daily as needed for allergies. 16 g 11  . furosemide (LASIX) 40 MG tablet Take as needed for a weight gain of more than 5 lbs in a week. 90 tablet 3  . hydroxypropyl methylcellulose (ISOPTO TEARS) 2.5 % ophthalmic solution Place 2 drops into both eyes 4 (four) times daily as needed for dry eyes.    . insulin aspart protamine - aspart (NOVOLOG MIX 70/30 FLEXPEN) (70-30) 100 UNIT/ML FlexPen Inject 0.3 mLs (30 Units total) into the skin 2 (two) times daily. 18 pen 3  . Insulin Pen Needle (NOVOFINE) 30G X 8 MM MISC Inject SQ as directed twice daily, use new pen needle for each injection 2 each 11  .  Multiple Vitamins-Minerals (MULTIVITAMIN WITH MINERALS) tablet Take 1 tablet by mouth daily.    [name] oxyCODONE (OXY IR/ROXICODONE) 5 MG immediate release tablet Take 1 tablet by mouth every 4 hours as needed for mild to moderate pain: Take 2 tablets =10 mg by mouth every 4 hours as needed for moderate to severe pain : Take 1 tablet by mouth every 4 hours as needed for severe/breakthrough pain. 240 tablet 0  . sennosides-docusate sodium (SENOKOT-S) 8.6-50 MG tablet Take 2 tablets by mouth daily.    [name] terazosin (HYTRIN) 10 MG capsule Take 10 mg by mouth at bedtime.     No current facility-administered medications for this visit.     Past Medical History  Diagnosis Date  . 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 Hyperlipidemia LDL goal < 100 04/02/2006  . Essential hypertension  12/09/2006  . Coronary artery disease 04/02/2006    s/p 2 stents, specifics unknown   . Benign prostatic hypertrophy with nocturia 07/07/2006  . Chronic venous insufficiency 04/12/2010  . Obesity (BMI 30.0-34.9) 01/15/2012  . Constipation 05/25/2009    Intermittent   . Microcytic normochromic anemia 05/27/2006  . Seasonal allergic rhinitis 05/25/2009  . Degenerative joint disease involving multiple joints 02/02/2007  . Internal and external hemorrhoids without complication 08/20/2012    Past Surgical History  Procedure Laterality Date  . Pilonidal cyst excision    . Eye surgery      Laser  . Fracture surgery      Fractured stump  .  Total hip arthroplasty Left 06/14/2014    Procedure: HEMI HIP ARTHROPLASTY ANTERIOR APPROACH;  Surgeon: Tarry KosNaiping M Xu, MD;  Location: [facility] OR;  Service: Orthopedics;  Laterality: Left;    History   Social History  . Marital Status: Married    Spouse Name: N/A  . Number of Children: N/A  . Years of Education: N/A   Occupational History  . Not on file.   Social History Main Topics  . Smoking status: Former Smoker    Quit date: 06/16/1968  . Smokeless tobacco: Never Used  . Alcohol Use: No  . Drug Use: No  . Sexual Activity: Not on file   Other Topics Concern  . Not on file   Social History Narrative    ROS: no fevers or chills, productive cough, hemoptysis, dysphasia, odynophagia, melena, hematochezia, dysuria, hematuria, rash, seizure activity, orthopnea, PND, pedal edema, claudication. Remaining systems are negative.  Physical Exam: Well-developed well-nourished in no acute distress.  Skin is warm and dry.  HEENT is normal.  Neck is supple.  Chest is clear to auscultation with normal expansion.  Cardiovascular exam is regular rate and rhythm.  Abdominal exam nontender or distended. No masses palpated. Extremities show no edema. neuro grossly intact  ECG     This encounter was created in error - please disregard.

## 2014-07-14 ENCOUNTER — Encounter: Payer: Medicare PPO | Admitting: Cardiology

## 2014-07-14 ENCOUNTER — Telehealth: Payer: Self-pay | Admitting: *Deleted

## 2014-07-14 NOTE — Telephone Encounter (Signed)
Assessment tomorrow OK.

## 2014-07-14 NOTE — Telephone Encounter (Signed)
Talked again with OT that is treating pt and he explains that the small area he wants the nurse to look at is Not open, not red and not sore to pt.  It just looks like it it's not as symmetrical as the rest of the wound.  Pt has no complaints. Nursing will be able to see tomorrow.

## 2014-07-14 NOTE — Telephone Encounter (Signed)
Received a call from Acupuncturistccupational Therapist,  with [facility],  working with pt. -  # [phone] S/P fall with fx to femur head on 2/2.  Pt has f/u in 6 weeks with ortho. OT noted steri strips coming off and suture line area open some.  He is asking for nursing to come out and assess this area. Will this be okay with you?

## 2014-07-14 NOTE — Telephone Encounter (Signed)
[name] [name] came to me to ask for VO. I gave OK and asked that the ERN be sent out to day to assess. Any issues / concerns - [facility] Next PCP appt May and doesn't see ortho for quite some time.

## 2014-07-25 ENCOUNTER — Other Ambulatory Visit (HOSPITAL_COMMUNITY): Payer: Self-pay

## 2014-07-25 ENCOUNTER — Telehealth: Payer: Self-pay | Admitting: *Deleted

## 2014-07-25 ENCOUNTER — Encounter (HOSPITAL_COMMUNITY): Payer: Self-pay

## 2014-07-25 ENCOUNTER — Inpatient Hospital Stay (HOSPITAL_COMMUNITY)
Admission: EM | Admit: 2014-07-25 | Discharge: 2014-07-28 | DRG: 309 | Disposition: A | Discharge status: Home / Self Care | Payer: Medicare PPO | Attending: Internal Medicine | Admitting: Internal Medicine

## 2014-07-25 ENCOUNTER — Emergency Department (HOSPITAL_COMMUNITY): Payer: Medicare PPO

## 2014-07-25 DIAGNOSIS — N401 Enlarged prostate with lower urinary tract symptoms: Secondary | ICD-10-CM | POA: Diagnosis present

## 2014-07-25 DIAGNOSIS — N185 Chronic kidney disease, stage 5: Secondary | ICD-10-CM

## 2014-07-25 DIAGNOSIS — I25118 Atherosclerotic heart disease of native coronary artery with other forms of angina pectoris: Secondary | ICD-10-CM

## 2014-07-25 DIAGNOSIS — N183 Chronic kidney disease, stage 3 (moderate): Secondary | ICD-10-CM | POA: Diagnosis present

## 2014-07-25 DIAGNOSIS — Z89611 Acquired absence of right leg above knee: Secondary | ICD-10-CM

## 2014-07-25 DIAGNOSIS — Z794 Long term (current) use of insulin: Secondary | ICD-10-CM

## 2014-07-25 DIAGNOSIS — I959 Hypotension, unspecified: Secondary | ICD-10-CM | POA: Diagnosis present

## 2014-07-25 DIAGNOSIS — Z881 Allergy status to other antibiotic agents status: Secondary | ICD-10-CM

## 2014-07-25 DIAGNOSIS — Z96642 Presence of left artificial hip joint: Secondary | ICD-10-CM | POA: Diagnosis present

## 2014-07-25 DIAGNOSIS — I4510 Unspecified right bundle-branch block: Secondary | ICD-10-CM | POA: Diagnosis present

## 2014-07-25 DIAGNOSIS — E1140 Type 2 diabetes mellitus with diabetic neuropathy, unspecified: Secondary | ICD-10-CM | POA: Diagnosis present

## 2014-07-25 DIAGNOSIS — N186 End stage renal disease: Secondary | ICD-10-CM

## 2014-07-25 DIAGNOSIS — I10 Essential (primary) hypertension: Secondary | ICD-10-CM | POA: Diagnosis present

## 2014-07-25 DIAGNOSIS — M810 Age-related osteoporosis without current pathological fracture: Secondary | ICD-10-CM | POA: Diagnosis present

## 2014-07-25 DIAGNOSIS — D631 Anemia in chronic kidney disease: Secondary | ICD-10-CM | POA: Diagnosis present

## 2014-07-25 DIAGNOSIS — E1149 Type 2 diabetes mellitus with other diabetic neurological complication: Secondary | ICD-10-CM | POA: Diagnosis present

## 2014-07-25 DIAGNOSIS — K219 Gastro-esophageal reflux disease without esophagitis: Secondary | ICD-10-CM | POA: Diagnosis present

## 2014-07-25 DIAGNOSIS — I4892 Unspecified atrial flutter: Principal | ICD-10-CM | POA: Diagnosis present

## 2014-07-25 DIAGNOSIS — E876 Hypokalemia: Secondary | ICD-10-CM | POA: Diagnosis present

## 2014-07-25 DIAGNOSIS — Z888 Allergy status to other drugs, medicaments and biological substances status: Secondary | ICD-10-CM

## 2014-07-25 DIAGNOSIS — E46 Unspecified protein-calorie malnutrition: Secondary | ICD-10-CM | POA: Diagnosis present

## 2014-07-25 DIAGNOSIS — K5900 Constipation, unspecified: Secondary | ICD-10-CM | POA: Diagnosis present

## 2014-07-25 DIAGNOSIS — Z7982 Long term (current) use of aspirin: Secondary | ICD-10-CM

## 2014-07-25 DIAGNOSIS — E785 Hyperlipidemia, unspecified: Secondary | ICD-10-CM | POA: Diagnosis present

## 2014-07-25 DIAGNOSIS — Z6834 Body mass index (BMI) 34.0-34.9, adult: Secondary | ICD-10-CM

## 2014-07-25 DIAGNOSIS — I2510 Atherosclerotic heart disease of native coronary artery without angina pectoris: Secondary | ICD-10-CM | POA: Diagnosis present

## 2014-07-25 DIAGNOSIS — N289 Disorder of kidney and ureter, unspecified: Secondary | ICD-10-CM | POA: Diagnosis present

## 2014-07-25 DIAGNOSIS — I739 Peripheral vascular disease, unspecified: Secondary | ICD-10-CM | POA: Diagnosis present

## 2014-07-25 DIAGNOSIS — I872 Venous insufficiency (chronic) (peripheral): Secondary | ICD-10-CM | POA: Diagnosis present

## 2014-07-25 DIAGNOSIS — Z96649 Presence of unspecified artificial hip joint: Secondary | ICD-10-CM | POA: Diagnosis present

## 2014-07-25 DIAGNOSIS — E11319 Type 2 diabetes mellitus with unspecified diabetic retinopathy without macular edema: Secondary | ICD-10-CM | POA: Diagnosis present

## 2014-07-25 DIAGNOSIS — I4891 Unspecified atrial fibrillation: Secondary | ICD-10-CM | POA: Diagnosis present

## 2014-07-25 DIAGNOSIS — R531 Weakness: Secondary | ICD-10-CM | POA: Diagnosis not present

## 2014-07-25 DIAGNOSIS — E1122 Type 2 diabetes mellitus with diabetic chronic kidney disease: Secondary | ICD-10-CM | POA: Diagnosis present

## 2014-07-25 DIAGNOSIS — I129 Hypertensive chronic kidney disease with stage 1 through stage 4 chronic kidney disease, or unspecified chronic kidney disease: Secondary | ICD-10-CM | POA: Diagnosis present

## 2014-07-25 DIAGNOSIS — Z87891 Personal history of nicotine dependence: Secondary | ICD-10-CM

## 2014-07-25 DIAGNOSIS — N179 Acute kidney failure, unspecified: Secondary | ICD-10-CM | POA: Diagnosis present

## 2014-07-25 DIAGNOSIS — Z7901 Long term (current) use of anticoagulants: Secondary | ICD-10-CM

## 2014-07-25 DIAGNOSIS — H04129 Dry eye syndrome of unspecified lacrimal gland: Secondary | ICD-10-CM | POA: Diagnosis present

## 2014-07-25 DIAGNOSIS — R000 Tachycardia, unspecified: Secondary | ICD-10-CM | POA: Insufficient documentation

## 2014-07-25 DIAGNOSIS — E11649 Type 2 diabetes mellitus with hypoglycemia without coma: Secondary | ICD-10-CM | POA: Diagnosis present

## 2014-07-25 DIAGNOSIS — D509 Iron deficiency anemia, unspecified: Secondary | ICD-10-CM | POA: Diagnosis present

## 2014-07-25 DIAGNOSIS — M1990 Unspecified osteoarthritis, unspecified site: Secondary | ICD-10-CM | POA: Diagnosis present

## 2014-07-25 DIAGNOSIS — E1159 Type 2 diabetes mellitus with other circulatory complications: Secondary | ICD-10-CM | POA: Diagnosis present

## 2014-07-25 DIAGNOSIS — R0902 Hypoxemia: Secondary | ICD-10-CM

## 2014-07-25 DIAGNOSIS — E663 Overweight: Secondary | ICD-10-CM | POA: Diagnosis present

## 2014-07-25 HISTORY — DX: Gastro-esophageal reflux disease without esophagitis: K21.9

## 2014-07-25 HISTORY — DX: Personal history of other medical treatment: Z92.89

## 2014-07-25 LAB — CBC WITH DIFFERENTIAL/PLATELET
BASOS ABS: 0 10*3/uL (ref 0.0–0.1)
BASOS PCT: 0 % (ref 0–1)
EOS PCT: 0 % (ref 0–5)
Eosinophils Absolute: 0 10*3/uL (ref 0.0–0.7)
HEMATOCRIT: 27.9 % — AB (ref 39.0–52.0)
Hemoglobin: 9 g/dL — ABNORMAL LOW (ref 13.0–17.0)
LYMPHS PCT: 48 % — AB (ref 12–46)
Lymphs Abs: 3.3 10*3/uL (ref 0.7–4.0)
MCH: 22.8 pg — ABNORMAL LOW (ref 26.0–34.0)
MCHC: 32.3 g/dL (ref 30.0–36.0)
MCV: 70.8 fL — AB (ref 78.0–100.0)
Monocytes Absolute: 0.7 10*3/uL (ref 0.1–1.0)
Monocytes Relative: 10 % (ref 3–12)
Neutro Abs: 2.8 10*3/uL (ref 1.7–7.7)
Neutrophils Relative %: 42 % — ABNORMAL LOW (ref 43–77)
Platelets: 267 10*3/uL (ref 150–400)
RBC: 3.94 MIL/uL — ABNORMAL LOW (ref 4.22–5.81)
RDW: 16.2 % — ABNORMAL HIGH (ref 11.5–15.5)
WBC: 6.8 10*3/uL (ref 4.0–10.5)

## 2014-07-25 LAB — I-STAT CHEM 8, ED
BUN: 66 mg/dL — ABNORMAL HIGH (ref 6–20)
Calcium, Ion: 1.15 mmol/L (ref 1.13–1.30)
Chloride: 110 mmol/L (ref 101–111)
Creatinine, Ser: 2.4 mg/dL — ABNORMAL HIGH (ref 0.61–1.24)
Glucose, Bld: 77 mg/dL (ref 70–99)
HCT: 31 % — ABNORMAL LOW (ref 39.0–52.0)
HEMOGLOBIN: 10.5 g/dL — AB (ref 13.0–17.0)
Potassium: 4.2 mmol/L (ref 3.5–5.1)
SODIUM: 140 mmol/L (ref 135–145)
TCO2: 15 mmol/L (ref 0–100)

## 2014-07-25 LAB — TROPONIN I: Troponin I: <0.03 ng/mL (ref <0.031)

## 2014-07-25 LAB — URINALYSIS, ROUTINE W REFLEX MICROSCOPIC
BILIRUBIN URINE: NEGATIVE
Glucose, UA: NEGATIVE mg/dL
Hgb urine dipstick: NEGATIVE
KETONES UR: NEGATIVE mg/dL
Leukocytes, UA: NEGATIVE
NITRITE: NEGATIVE
Protein, ur: NEGATIVE mg/dL
SPECIFIC GRAVITY, URINE: 1.013 (ref 1.005–1.030)
UROBILINOGEN UA: 0.2 mg/dL (ref 0.0–1.0)
pH: 5 (ref 5.0–8.0)

## 2014-07-25 LAB — COMPREHENSIVE METABOLIC PANEL
ALT: 15 U/L — ABNORMAL LOW (ref 17–63)
ANION GAP: 10 (ref 5–15)
AST: 28 U/L (ref 15–41)
Albumin: 1.9 g/dL — ABNORMAL LOW (ref 3.5–5.0)
Alkaline Phosphatase: 84 U/L (ref 38–126)
BILIRUBIN TOTAL: 0.4 mg/dL (ref 0.3–1.2)
BUN: 74 mg/dL — ABNORMAL HIGH (ref 6–20)
CO2: 17 mmol/L — ABNORMAL LOW (ref 22–32)
Calcium: 7.9 mg/dL — ABNORMAL LOW (ref 8.9–10.3)
Chloride: 112 mmol/L — ABNORMAL HIGH (ref 101–111)
Creatinine, Ser: 2.6 mg/dL — ABNORMAL HIGH (ref 0.61–1.24)
GFR, EST AFRICAN AMERICAN: 27 mL/min — AB (ref >60)
GFR, EST NON AFRICAN AMERICAN: 23 mL/min — AB (ref >60)
Glucose, Bld: 78 mg/dL (ref 70–99)
Potassium: 4.2 mmol/L (ref 3.5–5.1)
SODIUM: 139 mmol/L (ref 135–145)
Total Protein: 6.2 g/dL — ABNORMAL LOW (ref 6.5–8.1)

## 2014-07-25 LAB — TYPE AND SCREEN
ABO/RH(D): O POS
ANTIBODY SCREEN: NEGATIVE

## 2014-07-25 LAB — GLUCOSE, CAPILLARY
GLUCOSE-CAPILLARY: 86 mg/dL (ref 70–99)
Glucose-Capillary: 112 mg/dL — ABNORMAL HIGH (ref 70–99)

## 2014-07-25 LAB — CBG MONITORING, ED
Glucose-Capillary: 48 mg/dL — ABNORMAL LOW (ref 70–99)
Glucose-Capillary: 65 mg/dL — ABNORMAL LOW (ref 70–99)

## 2014-07-25 LAB — D-DIMER, QUANTITATIVE: D-Dimer, Quant: 0.65 ug/mL-FEU — ABNORMAL HIGH (ref 0.00–0.48)

## 2014-07-25 LAB — LIPASE, BLOOD: Lipase: 13 U/L — ABNORMAL LOW (ref 22–51)

## 2014-07-25 LAB — PROTIME-INR
INR: 1.21 (ref 0.00–1.49)
Prothrombin Time: 15.4 seconds — ABNORMAL HIGH (ref 11.6–15.2)

## 2014-07-25 LAB — I-STAT CG4 LACTIC ACID, ED: LACTIC ACID, VENOUS: 1.34 mmol/L (ref 0.5–2.0)

## 2014-07-25 LAB — BRAIN NATRIURETIC PEPTIDE: B NATRIURETIC PEPTIDE 5: 497.3 pg/mL — AB (ref 0.0–100.0)

## 2014-07-25 MED ORDER — SODIUM CHLORIDE 0.9 % IJ SOLN
3.0000 mL | Freq: Two times a day (BID) | INTRAMUSCULAR | Status: DC
  Administered 2014-07-25 – 2014-07-26 (×2): 3 mL via INTRAVENOUS

## 2014-07-25 MED ORDER — SODIUM CHLORIDE 0.9 % IV SOLN
INTRAVENOUS | Status: DC
  Administered 2014-07-25 – 2014-07-27 (×4): via INTRAVENOUS

## 2014-07-25 MED ORDER — INSULIN ASPART PROT & ASPART (70-30 MIX) 100 UNIT/ML ~~LOC~~ SUSP
20.0000 [IU] | Freq: Two times a day (BID) | SUBCUTANEOUS | Status: DC
  Administered 2014-07-26 – 2014-07-28 (×6): 20 [IU] via SUBCUTANEOUS
  Filled 2014-07-25: qty 10

## 2014-07-25 MED ORDER — HYPROMELLOSE (GONIOSCOPIC) 2.5 % OP SOLN
2.0000 [drp] | Freq: Four times a day (QID) | OPHTHALMIC | Status: DC | PRN
  Filled 2014-07-25: qty 15

## 2014-07-25 MED ORDER — FERROUS SULFATE 325 (65 FE) MG PO TABS
325.0000 mg | ORAL_TABLET | Freq: Every day | ORAL | Status: DC
Start: 2014-07-26 — End: 2014-07-28
  Administered 2014-07-26 – 2014-07-28 (×3): 325 mg via ORAL
  Filled 2014-07-25 (×4): qty 1

## 2014-07-25 MED ORDER — ONDANSETRON HCL 4 MG/2ML IJ SOLN: 4.0000 mg | Freq: Once | INTRAMUSCULAR | Status: DC

## 2014-07-25 MED ORDER — ADULT MULTIVITAMIN W/MINERALS CH
1.0000 | ORAL_TABLET | Freq: Every day | ORAL | Status: DC
  Administered 2014-07-25 – 2014-07-28 (×4): 1 via ORAL
  Filled 2014-07-25 (×6): qty 1

## 2014-07-25 MED ORDER — SODIUM CHLORIDE 0.9 % IJ SOLN
3.0000 mL | Freq: Two times a day (BID) | INTRAMUSCULAR | Status: DC
  Administered 2014-07-25 – 2014-07-27 (×4): 3 mL via INTRAVENOUS

## 2014-07-25 MED ORDER — MULTI-VITAMIN/MINERALS PO TABS: 1.0000 | ORAL_TABLET | Freq: Every day | ORAL | Status: DC

## 2014-07-25 MED ORDER — ASPIRIN EC 325 MG PO TBEC
325.0000 mg | DELAYED_RELEASE_TABLET | Freq: Every day | ORAL | Status: DC
  Administered 2014-07-26 – 2014-07-28 (×3): 325 mg via ORAL
  Filled 2014-07-25 (×3): qty 1

## 2014-07-25 MED ORDER — ACETAMINOPHEN 325 MG PO TABS: 650.0000 mg | ORAL_TABLET | Freq: Four times a day (QID) | ORAL | Status: DC | PRN

## 2014-07-25 MED ORDER — SODIUM CHLORIDE 0.9 % IV SOLN: 250.0000 mL | INTRAVENOUS | Status: DC | PRN

## 2014-07-25 MED ORDER — SODIUM CHLORIDE 0.9 % IJ SOLN: 3.0000 mL | INTRAMUSCULAR | Status: DC | PRN

## 2014-07-25 MED ORDER — SODIUM CHLORIDE 0.9 % IV SOLN
INTRAVENOUS | Status: AC
  Administered 2014-07-25: 19:00:00 via INTRAVENOUS

## 2014-07-25 MED ORDER — HEPARIN SODIUM (PORCINE) 5000 UNIT/ML IJ SOLN
5000.0000 [IU] | Freq: Three times a day (TID) | INTRAMUSCULAR | Status: DC
  Administered 2014-07-25 – 2014-07-28 (×8): 5000 [IU] via SUBCUTANEOUS
  Filled 2014-07-25 (×10): qty 1

## 2014-07-25 MED ORDER — ACETAMINOPHEN 650 MG RE SUPP: 650.0000 mg | Freq: Four times a day (QID) | RECTAL | Status: DC | PRN

## 2014-07-25 MED ORDER — INSULIN ASPART PROT & ASPART (70-30 MIX) 100 UNIT/ML PEN: 20.0000 [IU] | PEN_INJECTOR | Freq: Two times a day (BID) | SUBCUTANEOUS | Status: DC

## 2014-07-25 MED ORDER — METOPROLOL TARTRATE 1 MG/ML IV SOLN
2.5000 mg | Freq: Once | INTRAVENOUS | Status: AC
  Administered 2014-07-25: 2.5 mg via INTRAVENOUS
  Filled 2014-07-25: qty 5

## 2014-07-25 MED ORDER — DOCUSATE SODIUM 100 MG PO CAPS
100.0000 mg | ORAL_CAPSULE | Freq: Two times a day (BID) | ORAL | Status: DC
  Administered 2014-07-25 – 2014-07-26 (×3): 100 mg via ORAL
  Filled 2014-07-25 (×7): qty 1

## 2014-07-25 NOTE — ED Notes (Signed)
Lab results of Chem 8  HB was 10.5 and CG4  Was 1.34 [facility].

## 2014-07-25 NOTE — Telephone Encounter (Signed)
Thank you [name] [name].. I think the patient needs to be evaluated in the ED. I agree with your advice
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
## 2014-07-25 NOTE — ED Notes (Signed)
Per EMS, Patient is coming from home with complaints of generalized weakness since this past Friday. Patient has not felt like eating and has continued to feel weak. Patient was released from rehab two weeks ago after a left sided total hip replacement and left femur fracture repair due to a fall. Patient's wife states that patient was doing well and he has had a sudden change since Friday. Patient arrives to the ED alert and Oriented x4 and ST on the monitor.

## 2014-07-25 NOTE — Telephone Encounter (Signed)
Call from Physical Therapist with [facility] [place] - # [phone] Physical Therapist reports she is with pt, he is sitting up and able to drink and talk but his Heart rate is 145 and she can not get a Blood Pressure.  She had him lay down and BP was 80/40.  Sats are 89%  He has not been eating and feels weak.    The question was office visit or ED  I advised her to call EMS now and transport to ED
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
## 2014-07-25 NOTE — ED Notes (Signed)
MD Pfeiffer at the bedside 

## 2014-07-25 NOTE — ED Notes (Signed)
Report attempted 

## 2014-07-25 NOTE — Progress Notes (Signed)
NURSING PROGRESS NOTE  Derrek GuWilliam R Beller [id number] Admission Data: 07/25/2014 6:42 PM Attending Provider: Burns SpainElizabeth A Butcher, MD WUJ:[id number], Smith MinceLAWRENCE D, MD Code Status: Full  Allergies:  Amoxicillin; Pravastatin; Tamsulosin; and Doxycycline Past Medical History:   has a past medical history of Hyperlipidemia LDL goal < 100 (04/02/2006); Essential hypertension (12/09/2006); Benign prostatic hypertrophy with nocturia (07/07/2006); Chronic venous insufficiency (04/12/2010); Obesity (BMI 30.0-34.9) (01/15/2012); Constipation (05/25/2009); Microcytic normochromic anemia (05/27/2006); Seasonal allergic rhinitis (05/25/2009); Internal and external hemorrhoids without complication (08/20/2012); Coronary artery disease (04/02/2006); Type 2 diabetes mellitus with neurological manifestations (04/02/2006); 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 Past Surgical History:   has past surgical history that includes Pilonidal cyst excision (1990's); Refractive surgery (Bilateral); Fracture surgery (Left); Total hip arthroplasty (Left, 06/14/2014); Joint replacement; Leg amputation above knee (Right, ~ 2008); and Coronary angioplasty with stent. Social History:   reports that he quit smoking about 46 years ago. His smoking use included Cigarettes. He has a 10 pack-year smoking history. He has never used smokeless tobacco. He reports that he drinks alcohol. He reports that he does not use illicit drugs.  Derrek GuWilliam R Vines is a 69 y.o. male patient admitted from ED:   Last Documented Vital Signs: Blood  pressure 98/65, pulse 129, temperature 98.9 F (37.2 C), temperature source Oral, resp. rate 20, height 5\' 9"  (1.753 m), weight 184.4 kg (406 lb 8.5 oz), SpO2 100 %.  Cardiac Monitoring: Box # 8 in place. Cardiac monitor yields:sinus tachycardia.  IV Fluids:  IV in place, occlusive dsg intact without redness, IV cath hand right, condition patent and no redness normal saline.   Skin: WDL  Patient/Family orientated to room. Information packet given to patient/family. Admission inpatient armband information verified with patient/family to include name and date of birth and placed on patient arm. Side rails up x 2, fall assessment and education completed with patient/family. Patient/family able to verbalize understanding of risk associated with falls and verbalized understanding to call for assistance before getting out of bed. Call light within reach. Patient/family able to voice and demonstrate understanding of unit orientation instructions.    Will continue to evaluate and treat per MD orders.   Leane PlattSpencer Arbie Blankley RN, BS, BSN

## 2014-07-26 ENCOUNTER — Observation Stay (HOSPITAL_COMMUNITY): Payer: Medicare PPO

## 2014-07-26 DIAGNOSIS — R000 Tachycardia, unspecified: Secondary | ICD-10-CM | POA: Diagnosis not present

## 2014-07-26 DIAGNOSIS — I129 Hypertensive chronic kidney disease with stage 1 through stage 4 chronic kidney disease, or unspecified chronic kidney disease: Secondary | ICD-10-CM

## 2014-07-26 DIAGNOSIS — I2510 Atherosclerotic heart disease of native coronary artery without angina pectoris: Secondary | ICD-10-CM | POA: Diagnosis not present

## 2014-07-26 DIAGNOSIS — N179 Acute kidney failure, unspecified: Secondary | ICD-10-CM

## 2014-07-26 DIAGNOSIS — E1122 Type 2 diabetes mellitus with diabetic chronic kidney disease: Secondary | ICD-10-CM | POA: Diagnosis not present

## 2014-07-26 DIAGNOSIS — Z79899 Other long term (current) drug therapy: Secondary | ICD-10-CM

## 2014-07-26 DIAGNOSIS — R531 Weakness: Secondary | ICD-10-CM | POA: Diagnosis not present

## 2014-07-26 DIAGNOSIS — K5900 Constipation, unspecified: Secondary | ICD-10-CM

## 2014-07-26 DIAGNOSIS — N400 Enlarged prostate without lower urinary tract symptoms: Secondary | ICD-10-CM

## 2014-07-26 DIAGNOSIS — M810 Age-related osteoporosis without current pathological fracture: Secondary | ICD-10-CM

## 2014-07-26 DIAGNOSIS — Z794 Long term (current) use of insulin: Secondary | ICD-10-CM

## 2014-07-26 DIAGNOSIS — Z7982 Long term (current) use of aspirin: Secondary | ICD-10-CM

## 2014-07-26 DIAGNOSIS — H04123 Dry eye syndrome of bilateral lacrimal glands: Secondary | ICD-10-CM

## 2014-07-26 DIAGNOSIS — E46 Unspecified protein-calorie malnutrition: Secondary | ICD-10-CM

## 2014-07-26 DIAGNOSIS — I4892 Unspecified atrial flutter: Secondary | ICD-10-CM | POA: Diagnosis present

## 2014-07-26 HISTORY — DX: Unspecified atrial flutter: I48.92

## 2014-07-26 LAB — CBC
HCT: 26.9 % — ABNORMAL LOW (ref 39.0–52.0)
Hemoglobin: 8.7 g/dL — ABNORMAL LOW (ref 13.0–17.0)
MCH: 23.5 pg — AB (ref 26.0–34.0)
MCHC: 32.3 g/dL (ref 30.0–36.0)
MCV: 72.5 fL — AB (ref 78.0–100.0)
PLATELETS: 226 10*3/uL (ref 150–400)
RBC: 3.71 MIL/uL — ABNORMAL LOW (ref 4.22–5.81)
RDW: 16.8 % — AB (ref 11.5–15.5)
WBC: 5.9 10*3/uL (ref 4.0–10.5)

## 2014-07-26 LAB — BASIC METABOLIC PANEL
Anion gap: 10 (ref 5–15)
BUN: 62 mg/dL — AB (ref 6–20)
CHLORIDE: 114 mmol/L — AB (ref 101–111)
CO2: 15 mmol/L — AB (ref 22–32)
CREATININE: 1.97 mg/dL — AB (ref 0.61–1.24)
Calcium: 7.6 mg/dL — ABNORMAL LOW (ref 8.9–10.3)
GFR calc non Af Amer: 32 mL/min — ABNORMAL LOW (ref >60)
GFR, EST AFRICAN AMERICAN: 38 mL/min — AB (ref >60)
GLUCOSE: 114 mg/dL — AB (ref 70–99)
Potassium: 4.4 mmol/L (ref 3.5–5.1)
SODIUM: 139 mmol/L (ref 135–145)

## 2014-07-26 LAB — GLUCOSE, CAPILLARY
GLUCOSE-CAPILLARY: 93 mg/dL (ref 70–99)
GLUCOSE-CAPILLARY: 104 mg/dL — AB (ref 70–99)
Glucose-Capillary: 130 mg/dL — ABNORMAL HIGH (ref 70–99)
Glucose-Capillary: 130 mg/dL — ABNORMAL HIGH (ref 70–99)

## 2014-07-26 LAB — URINE CULTURE
Colony Count: NO GROWTH
Culture: NO GROWTH

## 2014-07-26 LAB — TSH: TSH: 3.2 u[IU]/mL (ref 0.350–4.500)

## 2014-07-26 LAB — TROPONIN I
Troponin I: <0.03 ng/mL (ref <0.031)
Troponin I: <0.03 ng/mL (ref <0.031)

## 2014-07-26 LAB — MAGNESIUM: Magnesium: 2 mg/dL (ref 1.7–2.4)

## 2014-07-26 LAB — FERRITIN: Ferritin: 175 ng/mL (ref 24–336)

## 2014-07-26 MED ORDER — FAMOTIDINE 20 MG PO TABS
20.0000 mg | ORAL_TABLET | Freq: Two times a day (BID) | ORAL | Status: DC
  Administered 2014-07-26 – 2014-07-28 (×5): 20 mg via ORAL
  Filled 2014-07-26 (×7): qty 1

## 2014-07-26 MED ORDER — CALCIUM CITRATE-VITAMIN D 500-400 MG-UNIT PO CHEW
0.5000 | CHEWABLE_TABLET | Freq: Every day | ORAL | Status: DC
  Filled 2014-07-26: qty 0.5

## 2014-07-26 MED ORDER — BOOST PLUS PO LIQD
237.0000 mL | Freq: Three times a day (TID) | ORAL | Status: DC
  Administered 2014-07-26 – 2014-07-27 (×4): 237 mL via ORAL
  Filled 2014-07-26 (×10): qty 237

## 2014-07-26 MED ORDER — CALCIUM CITRATE-VITAMIN D 315-200 MG-UNIT PO TABS: 1.0000 | ORAL_TABLET | Freq: Every day | ORAL | Status: DC

## 2014-07-26 MED ORDER — CALCIUM CARBONATE ANTACID 500 MG PO CHEW
1.0000 | CHEWABLE_TABLET | Freq: Once | ORAL | Status: AC
  Administered 2014-07-26: 200 mg via ORAL
  Filled 2014-07-26: qty 1

## 2014-07-26 MED ORDER — CALCIUM CARBONATE-VITAMIN D 500-200 MG-UNIT PO TABS
0.5000 | ORAL_TABLET | Freq: Every day | ORAL | Status: DC
  Administered 2014-07-26 – 2014-07-28 (×3): 0.5 via ORAL
  Filled 2014-07-26 (×3): qty 0.5

## 2014-07-26 MED ORDER — SODIUM CHLORIDE 0.9 % IV BOLUS (SEPSIS)
1000.0000 mL | Freq: Once | INTRAVENOUS | Status: AC
  Administered 2014-07-26: 1000 mL via INTRAVENOUS
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
## 2014-07-26 NOTE — Progress Notes (Signed)
UR completed 

## 2014-07-26 NOTE — Consult Note (Signed)
Patient ID: [name] [name] MRN: [id number] DOB/AGE: 71/07/1945 71 y.o.  Admit date: 07/25/2014 Referring Physician: Rogelia BogaButcher Primary Cardiologist: Jens Somrenshaw Reason for Consultation: Tachycardia  HPI: 69 yo male with history of CAD s/p prior PCI/DES of the RCA in 2006, stage 3 chronic kidney disease, PAD s/p right AKAHTN, HLD, anemia, DM with neuropathy and retinopathy, former tobacco abuse admitted to the IM teaching service with c/o weakness with poor appetite and po intake recently. On admission noted to be tachycardic and hypotensive. He was felt to be hypovolemic and has been hydrated with IV fluids. Recent left total hip replacement on 06/14/14. He has denied chest pain or SOB. No fevers or chills at home.    Past Medical History  Diagnosis Date  . Hyperlipidemia LDL goal < 100 04/02/2006  . Essential hypertension 12/09/2006  . Benign prostatic hypertrophy with nocturia 07/07/2006  . Chronic venous insufficiency 04/12/2010  . Obesity (BMI 30.0-34.9) 01/15/2012  . Constipation 05/25/2009    Intermittent   . Microcytic normochromic anemia 05/27/2006  . Seasonal allergic rhinitis 05/25/2009  . Internal and external hemorrhoids without complication 08/20/2012  . Coronary artery disease 04/02/2006    s/p 2 stents, specifics unknown   . 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 Type II diabetes mellitus   . History of blood transfusion 05/2014    "related to OR"  . GERD (gastroesophageal reflux disease)   . Degenerative joint disease involving multiple joints 02/02/2007  .  Arthritis     "fingers" (07/25/2014)  . Type 2 diabetes mellitus with stage 3 chronic kidney disease     Family History  Problem Relation Age of Onset  . Hypertension Mother   . Heart attack Father   . Breast cancer Sister   . Arthritis Sister   . Heart attack Brother   . Diabetes Brother   . Stroke Brother   . Pneumonia Daughter   . Diabetes Brother   . Alcoholism Brother   . Diabetes Brother   . Arthritis Sister     Bilateral knee replacement  . HIV Daughter   . Hypertension Daughter   . Drug abuse Daughter   . Schizophrenia Daughter   . Hypertension Daughter   . Drug abuse Daughter   . Bipolar disorder Daughter     History   Social History  . Marital Status: Married    Spouse Name: N/A  . Number of Children: N/A  . Years of Education: N/A   Occupational History  . Not on file.   Social History Main Topics  . Smoking status: Former Smoker -- 1.00 packs/day for 10 years    Types: Cigarettes    Quit date: 06/16/1968  . Smokeless tobacco: Never Used  . Alcohol Use: Yes     Comment: "quit alcohol in the 1960's"  . Drug Use: No  . Sexual Activity: Yes    Birth Control/ Protection: None   Other Topics Concern  . Not on file   Social History Narrative    Past Surgical History  Procedure Laterality Date  . Pilonidal cyst  excision  1990's  . Refractive surgery Bilateral   . Fracture surgery Left     "hip/stump"  . Total hip arthroplasty Left 06/14/2014    Procedure: HEMI HIP ARTHROPLASTY ANTERIOR APPROACH;  Surgeon: Tarry Kos, MD;  Location: [facility] OR;  Service: Orthopedics;  Laterality: Left;  . Joint replacement      Left Hip  . Leg amputation above knee Right ~ 2008  . Coronary angioplasty with stent placement      Allergies  Allergen Reactions  . Amoxicillin Swelling and Other (See Comments)    Puffy eyes and abdominal pain with Amoxicillin PO  . Pravastatin Other (See Comments)    Weakness and fatigue  . Tamsulosin Other (See Comments)    REACTION:  dry throat, sweating, blurred vision  . Doxycycline Rash and Other (See Comments)    Questionable drug rxn rash    Prior to Admission medications   Medication Sig Start Date End Date Taking? Authorizing Provider  amLODipine (NORVASC) 10 MG tablet Take 1 tablet (10 mg total) by mouth every morning. 12/29/13  Yes Burns Spain, MD  aspirin 325 MG EC tablet Take 325 mg by mouth daily. 07/03/14 08/02/14 Yes Historical Provider, MD  calcium citrate-vitamin D (CITRACAL+D) 315-200 MG-UNIT per tablet Take 1 tablet by mouth daily.    Yes Historical Provider, MD  carvedilol (COREG) 12.5 MG tablet Take 1 tablet (12.5 mg total) by mouth 2 (two) times daily with a meal. 01/05/14  Yes Doneen Poisson, MD  docusate sodium (COLACE) 100 MG capsule Take 100 mg by mouth 2 (two) times daily.   Yes Historical Provider, MD  ferrous sulfate 325 (65 FE) MG tablet Take 1 tablet (325 mg total) by mouth daily with breakfast. 06/23/14  Yes Tasrif Ahmed, MD  fish oil-omega-3 fatty acids 1000 MG capsule Take 1 g by mouth daily. 01/15/12  Yes Doneen Poisson, MD  fluticasone [facility]) 50 MCG/ACT nasal spray Place 2 sprays into both nostrils daily as needed for allergies. 12/29/13  Yes Burns Spain, MD  furosemide (LASIX) 40 MG tablet Take as needed for a weight gain of more than 5 lbs in a week. 02/10/14  Yes Doneen Poisson, MD  hydroxypropyl methylcellulose (ISOPTO TEARS) 2.5 % ophthalmic solution Place 2 drops into both eyes 4 (four) times daily as needed for dry eyes.   Yes Historical Provider, MD  insulin aspart protamine - aspart (NOVOLOG MIX 70/30 FLEXPEN) (70-30) 100 UNIT/ML FlexPen Inject 0.3 mLs (30 Units total) into the skin 2 (two) times daily. 05/16/14  Yes Doneen Poisson, MD  Insulin Pen Needle (NOVOFINE) 30G X 8 MM MISC Inject SQ as directed twice daily, use new pen needle for each injection 09/10/10  Yes Edsel Petrin, DO  lisinopril (PRINIVIL,ZESTRIL) 10 MG tablet Take 10 mg by mouth daily. 05/29/14  Yes  Historical Provider, MD  Multiple Vitamins-Minerals (MULTIVITAMIN WITH MINERALS) tablet Take 1 tablet by mouth daily.   Yes Historical Provider, MD  sennosides-docusate sodium (SENOKOT-S) 8.6-50 MG tablet Take 2 tablets by mouth daily.   Yes Historical Provider, MD  terazosin (HYTRIN) 10 MG capsule Take 10 mg by mouth at bedtime.   Yes Historical Provider, MD  oxyCODONE (OXY IR/ROXICODONE) 5 MG immediate release tablet Take 1 tablet by mouth every 4 hours as needed for mild to moderate pain: Take 2 tablets =10 mg by mouth every 4 hours as needed for moderate to severe pain : Take 1 tablet by mouth every 4 hours as needed for severe/breakthrough pain.  06/26/14   Kermit Baloiffany L Reed, DO   Hospital Medications:  . aspirin  325 mg Oral Daily  . docusate sodium  100 mg Oral BID  . ferrous sulfate  325 mg Oral Q breakfast  . heparin  5,000 Units Subcutaneous 3 times per day  . insulin aspart protamine- aspart  20 Units Subcutaneous BID WC  . multivitamin with minerals  1 tablet Oral Daily  . ondansetron (ZOFRAN) IV  4 mg Intravenous Once  . sodium chloride  3 mL Intravenous Q12H  . sodium chloride  3 mL Intravenous Q12H   Review of systems complete and found to be negative unless listed above   Physical Exam: Blood pressure 103/61, pulse 135, temperature 98.8 F (37.1 C), temperature source Oral, resp. rate 20, height 5\' 9"  (1.753 m), weight 406 lb 8.5 oz (184.4 kg), SpO2 100 %.    General: Well developed, well nourished, NAD  HEENT: OP clear, mucus membranes moist  SKIN: warm, dry. No rashes.  Neuro: No focal deficits  Musculoskeletal: Muscle strength 5/5 all ext  Psychiatric: Mood and affect normal  Neck: No JVD, no carotid bruits, no thyromegaly, no lymphadenopathy.  Lungs:Clear bilaterally, no wheezes, rhonci, crackles  Cardiovascular: Regular rate and rhythm. No murmurs, gallops or rubs.  Abdomen:Soft. Bowel sounds present. Non-tender.  Extremities: No lower extremity edema. Pulses are 2 +  in the bilateral DP/PT.  Labs:   Lab Results  Component Value Date   WBC 5.9 07/26/2014   HGB 8.7* 07/26/2014   HCT 26.9* 07/26/2014   MCV 72.5* 07/26/2014   PLT 226 07/26/2014    Recent Labs Lab 07/25/14 1249  07/26/14 0554  NA 139  < > 139  K 4.2  < > 4.4  CL 112*  < > 114*  CO2 17*  --  15*  BUN 74*  < > 62*  CREATININE 2.60*  < > 1.97*  CALCIUM 7.9*  --  7.6*  PROT 6.2*  --   --   BILITOT 0.4  --   --   ALKPHOS 84  --   --   ALT 15*  --   --   AST 28  --   --   GLUCOSE 78  < > 114*  < > = values in this interval not displayed.   Lab Results  Component Value Date   TROPONINI <0.03 07/25/2014    Chest x-ray 07/26/14: Borderline enlargement of cardiac silhouette. Mildly tortuous aorta. Mediastinal contours and pulmonary vascularity otherwise normal. Probable minimal bibasilar atelectasis. No definite infiltrate, pleural effusion or pneumothorax. IMPRESSION: Probable minimal bibasilar atelectasis without definite infiltrate.  EKG: sinus tachycardia, RBBB  ASSESSMENT AND PLAN:   1. Tachycardia: This appears to be sinus with RBBB. His tachycardia is most likely secondary to his hypovolemia. Will arrange echo to assess LV systolic function today. Would continue hydration with IV fluids today. Would also consider other potential causes for tachycardia including pulmonary embolism (recent hip replacement with elevated d-dimer).   2. CAD: stable. Troponin negative. No chest pain. No ischemic evaluation is needed.   We will follow along with you.    Signed: Verne Carrowhristopher Ranya Fiddler, MD 07/26/2014, 8:51 AM

## 2014-07-26 NOTE — Progress Notes (Signed)
Initial Nutrition Assessment  DOCUMENTATION CODES:  Obesity unspecified  INTERVENTION:  Other (Comment) (Boost Plus)  NUTRITION DIAGNOSIS:  Inadequate oral intake related to other (see comment) (decreased appetite) as evidenced by per patient/family report.   GOAL:  Patient will meet greater than or equal to 90% of their needs   MONITOR:  PO intake, Supplement acceptance, Labs, Weight trends, Skin, I & O's  REASON FOR ASSESSMENT:  Consult Assessment of nutrition requirement/status  ASSESSMENT: [name] [name] is a 69 year old man with history of type 2 diabetes with neuropathy, retinopathy, and CKD, stage III, peripheral vascular disease status post right above-knee amputation, coronary artery disease status post PCI, hypertension, BPH, and osteoporosis presenting with generalized weakness. He underwent a left total hip replacement on 06/14/2014 following a left femoral neck fracture, and he was released from rehabilitation 2 to 3 weeks ago. He reports generalized weakness since Friday associated with a reduced appetite. Per his wife, he was doing well until that time, but his appetite has never returned to normal after surgery. He has not had much to eat other than a few Ensure supplements. He reports some nausea with a couple of episodes of vomiting over the past week. His home physical therapist went to see him today and noted that his heart rate was 145, and she could not get a good pressure. He was satting 89% on room air at that time. He denies fever, chills, chest pain, abdominal pain, constipation, or diarrhea. He does report some difficulty urinating associated with some pain.  Hx obtained from pt and wife at bedside. Both report that pt has had decreased appetite for approximately one moth, since undergoing hip surgery. Pt wife reveals that intake was decreased [facility] was [facility], due to pt receiving foods he usually doesn't eat. Now that he is home, pt is  able to consume more familiar foods. Diet recall consists of breakfast: oatmeal with blueberries, lunch: salad, and dinner: sandwich. Wife reports that pt always eats a good breakfast, but intake varies at lunch and dinner- sometimes pt will eat well and sometimes will only eat a few bites. When pt intake is poor, wife supplements with Boost. Pt reports appetite has improved since he has been here but "has never been a big eater". Discussed importance of good intake to support healing and recovery. Pt is amenable to Boost. Also encouraged small, frequent meals to maximize intake. Discussed high protein snacks that pt can add to diet to supplement intake.  Both reveal UBW is between 190-200#. No muscle or fat depletion noted on physical exam, other than mild depletion on upper arm area. Pt and wife report no further nutrition concerns, but expressed appreciation for visit.  Labs reviewed. Cl: 114, CO2: 15, BUN/Creat: 62/1.97, Calcium: 7.0, Glucose: 114, CBG: 130.   Height:  Ht Readings from Last 1 Encounters:  07/25/14 5\' 9"  (1.753 m)    Weight:  Wt Readings from Last 1 Encounters:  07/26/14 189 lb 13.1 oz (86.1 kg)    Ideal Body Weight:  66.9 kg  Wt Readings from Last 10 Encounters:  07/26/14 189 lb 13.1 oz (86.1 kg)  07/04/14 185 lb 11.2 oz (84.233 kg)  06/26/14 209 lb (94.802 kg)  06/22/14 198 lb 14.4 oz (90.22 kg)  06/19/14 209 lb (94.802 kg)  06/18/14 220 lb 7.4 oz (100 kg)  05/26/14 207 lb 4.8 oz (94.031 kg)  03/11/14 206 lb (93.441 kg)  02/10/14 207 lb (93.895 kg)  11/11/13 206 lb 9.6 oz (  93.713 kg)    BMI:  Body mass index is 28.02 kg/(m^2).  Estimated Nutritional Needs:  Kcal:  1800-2000  Protein:  80-90 grams  Fluid:  1.8-2.0 L  Skin:  Reviewed, no issues  Diet Order:  Diet Carb Modified Fluid consistency:: Thin; Room service appropriate?: Yes  EDUCATION NEEDS:  Education needs addressed   Intake/Output Summary (Last 24 hours) at 07/26/14 1554 Last data  filed at 07/26/14 1441  Gross per 24 hour  Intake      0 ml  Output    150 ml  Net   -150 ml    Last BM:  07/25/14  Acxel Dingee A. Mayford KnifeWilliams, RD, LDN, CDE Pager: [phone] After hours Pager: [phone]

## 2014-07-26 NOTE — Progress Notes (Signed)
  Date: 07/26/2014  Patient name: [name] [name]  Medical record number: [id number]  Date of birth: 07/02/1945   I have seen and evaluated [name] GuWilliam R Vieau and discussed their care with the Residency Team. [name] [name] is a pt of Dr Charlesetta ShanksKlima's and has had a R AKA (reason not clear) and had a L THR 06/14/14 after a femoral neck fracture. He initially went to rehab but returned home 2 or 3 weeks ago to his home where he lives with his wife. He states he has never been a big eater but he and his family do endorse a loss of appetite since the surgery. He has lost 13 lbs since [employer] states he has been getting heartburn with meals and either tx it with a coke or TUMS. His PPI was stopped 10/2013. We gave him a med yesterday that resolved his heartburn. He denies dysphagia, CP, SOB. He additionally endorses generalized weakness but states it is resolved today. He denies any sxs of depression and states his hobby is taking care of his garden, yard, and plants and he is already working on it. He is getting home PT and is working on standing on his prosthesis for longer periods of time and walking with a walker although he does have a powerchair that he uses. His PT checked his vitals on day of admit and his HR was 140's and O2 sat 87% and he was sent to ED.  Filed Vitals:   07/26/14 0525  BP: 103/61  Pulse: 135  Temp: 98.8 F (37.1 C)  Resp: 20   Gen lying in bed with wife, daughter, and son in law in room. NAD. No resp distress H tachy no murmur ABD + BS, soft  Assessment and Plan: I have seen and evaluated the patient as outlined above. I agree with the formulated Assessment and Plan as detailed in the residents' admission note, with the following changes:   1. Generalized weakness - the current hypothesis is that he was vol contracted from decreased PO intake. His decreased intake could 2/2 reflux and Dr Glenard HaringModing is starting a PPI. There appears to be no other obvious cause inc no sxs of depression despite  recent surgery. His weakness has responded well to IVF and we will get PT / OT to see pt to cont his therapy.  His vol contraction is supported by increased Cr from baseline and decrease Cr with IVF, tachycardia (although not responding to IVF), increased BUN, slight weight loss, and pt reported anorexia.   2. Tachycardia - the working etiology is vol contraction although this has not responded to IVF despite his Cr responding. His EKG did show p waves and cards called this sinus tach so likely is sinus tach although the machine is reading as junctional. We will cont IVF today. If he cont to be tachy in the AM, will need to pursue EPE W/U although Dr Glenard HaringModing has documented why this is not a leading dx currently. Other causes are less likely - infection, anemia (at baseline), pain (well controlled), AMI (trop and EKG negative), hypoxia (despite the one low at home, is nl on RA), etc.  3. Acute on chronic kidney disease - responding to IVF.   D/C once ECHO complete and tachycardia resolved. Hopefully in 1-2 days  Burns SpainElizabeth A Butcher, MD 5/4/201611:52 AM

## 2014-07-26 NOTE — Progress Notes (Signed)
Inpatient Diabetes Program Recommendations  AACE/ADA: New Consensus Statement on Inpatient Glycemic Control (2013)  Target Ranges:  Prepandial:   less than 140 mg/dL      Peak postprandial:   less than 180 mg/dL (1-2 hours)      Critically ill patients:  140 - 180 mg/dL   Reason for Visit: Hypoglycemia  Diabetes history: DM2 Outpatient Diabetes medications: 70/30 30 units bid Current orders for Inpatient glycemic control: 70/30 20 units bid  Results for [name] [name], [name] [name] (MRN [id number]) as of 07/26/2014 15:36  Ref. Range 07/25/2014 17:57 07/25/2014 18:24 07/25/2014 22:25 07/26/2014 07:46 07/26/2014 12:11  Glucose-Capillary Latest Ref Range: 70-99 mg/dL 65 (L) 86 829112 (H) 562130 (H) 130 (H)   Results for [name] [name], [name] [name] (MRN [id number]) as of 07/26/2014 15:36  Ref. Range 05/26/2014 11:29  Hemoglobin A1C Unknown 6.3   Home dose reduced to prevent hypoglycemia. Needs correction insulin.  Inpatient Diabetes Program Recommendations Correction (SSI): Consider addition of Novolog sensitive tidwc   Note: Will continue to follow. Thank you. Ailene Ardshonda Ulmer Degen, RD, LDN, CDE Inpatient Diabetes Coordinator [phone]
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
## 2014-07-27 ENCOUNTER — Observation Stay (HOSPITAL_COMMUNITY): Payer: Medicare PPO

## 2014-07-27 ENCOUNTER — Telehealth: Payer: Self-pay | Admitting: Internal Medicine

## 2014-07-27 DIAGNOSIS — D509 Iron deficiency anemia, unspecified: Secondary | ICD-10-CM | POA: Diagnosis not present

## 2014-07-27 DIAGNOSIS — N183 Chronic kidney disease, stage 3 (moderate): Secondary | ICD-10-CM | POA: Diagnosis present

## 2014-07-27 DIAGNOSIS — I4891 Unspecified atrial fibrillation: Secondary | ICD-10-CM | POA: Diagnosis present

## 2014-07-27 DIAGNOSIS — Z7982 Long term (current) use of aspirin: Secondary | ICD-10-CM | POA: Diagnosis not present

## 2014-07-27 DIAGNOSIS — I129 Hypertensive chronic kidney disease with stage 1 through stage 4 chronic kidney disease, or unspecified chronic kidney disease: Secondary | ICD-10-CM | POA: Diagnosis present

## 2014-07-27 DIAGNOSIS — Z6834 Body mass index (BMI) 34.0-34.9, adult: Secondary | ICD-10-CM | POA: Diagnosis not present

## 2014-07-27 DIAGNOSIS — E1149 Type 2 diabetes mellitus with other diabetic neurological complication: Secondary | ICD-10-CM | POA: Diagnosis present

## 2014-07-27 DIAGNOSIS — E876 Hypokalemia: Secondary | ICD-10-CM | POA: Diagnosis present

## 2014-07-27 DIAGNOSIS — N401 Enlarged prostate with lower urinary tract symptoms: Secondary | ICD-10-CM | POA: Diagnosis present

## 2014-07-27 DIAGNOSIS — E11649 Type 2 diabetes mellitus with hypoglycemia without coma: Secondary | ICD-10-CM | POA: Diagnosis present

## 2014-07-27 DIAGNOSIS — K219 Gastro-esophageal reflux disease without esophagitis: Secondary | ICD-10-CM | POA: Diagnosis present

## 2014-07-27 DIAGNOSIS — E785 Hyperlipidemia, unspecified: Secondary | ICD-10-CM | POA: Diagnosis present

## 2014-07-27 DIAGNOSIS — E1140 Type 2 diabetes mellitus with diabetic neuropathy, unspecified: Secondary | ICD-10-CM | POA: Diagnosis present

## 2014-07-27 DIAGNOSIS — Z96649 Presence of unspecified artificial hip joint: Secondary | ICD-10-CM | POA: Diagnosis present

## 2014-07-27 DIAGNOSIS — E46 Unspecified protein-calorie malnutrition: Secondary | ICD-10-CM | POA: Diagnosis present

## 2014-07-27 DIAGNOSIS — M810 Age-related osteoporosis without current pathological fracture: Secondary | ICD-10-CM | POA: Diagnosis present

## 2014-07-27 DIAGNOSIS — R531 Weakness: Secondary | ICD-10-CM | POA: Diagnosis present

## 2014-07-27 DIAGNOSIS — I10 Essential (primary) hypertension: Secondary | ICD-10-CM | POA: Diagnosis not present

## 2014-07-27 DIAGNOSIS — I4892 Unspecified atrial flutter: Secondary | ICD-10-CM | POA: Diagnosis present

## 2014-07-27 DIAGNOSIS — I483 Typical atrial flutter: Secondary | ICD-10-CM | POA: Diagnosis not present

## 2014-07-27 DIAGNOSIS — N179 Acute kidney failure, unspecified: Secondary | ICD-10-CM | POA: Diagnosis present

## 2014-07-27 DIAGNOSIS — Z89611 Acquired absence of right leg above knee: Secondary | ICD-10-CM | POA: Diagnosis not present

## 2014-07-27 DIAGNOSIS — I2510 Atherosclerotic heart disease of native coronary artery without angina pectoris: Secondary | ICD-10-CM | POA: Diagnosis present

## 2014-07-27 DIAGNOSIS — I959 Hypotension, unspecified: Secondary | ICD-10-CM | POA: Diagnosis present

## 2014-07-27 DIAGNOSIS — R000 Tachycardia, unspecified: Secondary | ICD-10-CM | POA: Diagnosis not present

## 2014-07-27 DIAGNOSIS — I4510 Unspecified right bundle-branch block: Secondary | ICD-10-CM | POA: Diagnosis present

## 2014-07-27 DIAGNOSIS — K5900 Constipation, unspecified: Secondary | ICD-10-CM | POA: Diagnosis present

## 2014-07-27 DIAGNOSIS — E1122 Type 2 diabetes mellitus with diabetic chronic kidney disease: Secondary | ICD-10-CM | POA: Diagnosis present

## 2014-07-27 DIAGNOSIS — Z87891 Personal history of nicotine dependence: Secondary | ICD-10-CM | POA: Diagnosis not present

## 2014-07-27 DIAGNOSIS — Z881 Allergy status to other antibiotic agents status: Secondary | ICD-10-CM | POA: Diagnosis not present

## 2014-07-27 DIAGNOSIS — M1990 Unspecified osteoarthritis, unspecified site: Secondary | ICD-10-CM | POA: Diagnosis present

## 2014-07-27 DIAGNOSIS — E11319 Type 2 diabetes mellitus with unspecified diabetic retinopathy without macular edema: Secondary | ICD-10-CM | POA: Diagnosis present

## 2014-07-27 DIAGNOSIS — I739 Peripheral vascular disease, unspecified: Secondary | ICD-10-CM | POA: Diagnosis present

## 2014-07-27 DIAGNOSIS — Z7901 Long term (current) use of anticoagulants: Secondary | ICD-10-CM | POA: Diagnosis not present

## 2014-07-27 DIAGNOSIS — Z96642 Presence of left artificial hip joint: Secondary | ICD-10-CM | POA: Diagnosis present

## 2014-07-27 DIAGNOSIS — I872 Venous insufficiency (chronic) (peripheral): Secondary | ICD-10-CM | POA: Diagnosis present

## 2014-07-27 DIAGNOSIS — H04129 Dry eye syndrome of unspecified lacrimal gland: Secondary | ICD-10-CM | POA: Diagnosis present

## 2014-07-27 DIAGNOSIS — D631 Anemia in chronic kidney disease: Secondary | ICD-10-CM | POA: Diagnosis present

## 2014-07-27 DIAGNOSIS — N289 Disorder of kidney and ureter, unspecified: Secondary | ICD-10-CM | POA: Diagnosis present

## 2014-07-27 DIAGNOSIS — Z888 Allergy status to other drugs, medicaments and biological substances status: Secondary | ICD-10-CM | POA: Diagnosis not present

## 2014-07-27 DIAGNOSIS — E1159 Type 2 diabetes mellitus with other circulatory complications: Secondary | ICD-10-CM | POA: Diagnosis present

## 2014-07-27 DIAGNOSIS — Z794 Long term (current) use of insulin: Secondary | ICD-10-CM | POA: Diagnosis not present

## 2014-07-27 LAB — CBC
HEMATOCRIT: 26.2 % — AB (ref 39.0–52.0)
Hemoglobin: 8.4 g/dL — ABNORMAL LOW (ref 13.0–17.0)
MCH: 23.5 pg — ABNORMAL LOW (ref 26.0–34.0)
MCHC: 32.1 g/dL (ref 30.0–36.0)
MCV: 73.2 fL — ABNORMAL LOW (ref 78.0–100.0)
Platelets: 230 10*3/uL (ref 150–400)
RBC: 3.58 MIL/uL — ABNORMAL LOW (ref 4.22–5.81)
RDW: 16.8 % — ABNORMAL HIGH (ref 11.5–15.5)
WBC: 6.5 10*3/uL (ref 4.0–10.5)

## 2014-07-27 LAB — BASIC METABOLIC PANEL
Anion gap: 6 (ref 5–15)
BUN: 53 mg/dL — AB (ref 6–20)
CALCIUM: 7.6 mg/dL — AB (ref 8.9–10.3)
CO2: 17 mmol/L — ABNORMAL LOW (ref 22–32)
Chloride: 118 mmol/L — ABNORMAL HIGH (ref 101–111)
Creatinine, Ser: 1.77 mg/dL — ABNORMAL HIGH (ref 0.61–1.24)
GFR calc non Af Amer: 37 mL/min — ABNORMAL LOW (ref >60)
GFR, EST AFRICAN AMERICAN: 43 mL/min — AB (ref >60)
GLUCOSE: 94 mg/dL (ref 70–99)
Potassium: 4.5 mmol/L (ref 3.5–5.1)
SODIUM: 141 mmol/L (ref 135–145)

## 2014-07-27 LAB — OCCULT BLOOD X 1 CARD TO LAB, STOOL: Fecal Occult Bld: NEGATIVE

## 2014-07-27 LAB — TROPONIN I

## 2014-07-27 LAB — GLUCOSE, CAPILLARY
Glucose-Capillary: 80 mg/dL (ref 70–99)
Glucose-Capillary: 127 mg/dL — ABNORMAL HIGH (ref 70–99)
Glucose-Capillary: 159 mg/dL — ABNORMAL HIGH (ref 70–99)
Glucose-Capillary: 140 mg/dL — ABNORMAL HIGH (ref 70–99)

## 2014-07-27 LAB — MAGNESIUM: Magnesium: 1.9 mg/dL (ref 1.7–2.4)

## 2014-07-27 MED ORDER — AMIODARONE HCL IN DEXTROSE 360-4.14 MG/200ML-% IV SOLN
60.0000 mg/h | INTRAVENOUS | Status: AC
  Administered 2014-07-27: 60 mg/h via INTRAVENOUS
  Filled 2014-07-27 (×2): qty 200

## 2014-07-27 MED ORDER — AMIODARONE HCL IN DEXTROSE 360-4.14 MG/200ML-% IV SOLN
30.0000 mg/h | INTRAVENOUS | Status: DC
  Administered 2014-07-27: 30 mg/h via INTRAVENOUS
  Filled 2014-07-27 (×4): qty 200

## 2014-07-27 MED ORDER — AMIODARONE LOAD VIA INFUSION
150.0000 mg | Freq: Once | INTRAVENOUS | Status: AC
  Administered 2014-07-27: 150 mg via INTRAVENOUS
  Filled 2014-07-27: qty 83.34

## 2014-07-27 MED ORDER — CARVEDILOL 12.5 MG PO TABS
12.5000 mg | ORAL_TABLET | Freq: Two times a day (BID) | ORAL | Status: DC
  Administered 2014-07-27 – 2014-07-28 (×2): 12.5 mg via ORAL
  Filled 2014-07-27 (×3): qty 1

## 2014-07-27 MED ORDER — DILTIAZEM HCL 30 MG PO TABS
30.0000 mg | ORAL_TABLET | Freq: Four times a day (QID) | ORAL | Status: DC
  Filled 2014-07-27 (×4): qty 1

## 2014-07-27 MED ORDER — CARVEDILOL 12.5 MG PO TABS
12.5000 mg | ORAL_TABLET | Freq: Two times a day (BID) | ORAL | Status: DC
  Administered 2014-07-27: 12.5 mg via ORAL
  Filled 2014-07-27 (×3): qty 1

## 2014-07-27 MED ORDER — DILTIAZEM HCL 30 MG PO TABS
30.0000 mg | ORAL_TABLET | Freq: Four times a day (QID) | ORAL | Status: DC
  Administered 2014-07-27 – 2014-07-28 (×4): 30 mg via ORAL
  Filled 2014-07-27 (×6): qty 1
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
## 2014-07-27 NOTE — Evaluation (Signed)
Occupational Therapy Evaluation Patient Details Name: [name] GuWilliam R Dark MRN: [id number] DOB: 02/19/1946 Today's Date: 07/27/2014    History of Present Illness [name] [name] is a 69 year old man with history of type 2 diabetes with neuropathy, retinopathy, and CKD, stage III, peripheral vascular disease status post right above-knee amputation, coronary artery disease status post PCI, hypertension, BPH, and osteoporosis presenting with generalized weakness. He underwent a left total hip replacement on 06/14/2014 following a left femoral neck fracture, and he was released from rehabilitation 2 to 3 weeks ago. He reports generalized weakness since Friday associated with a reduced appetite.  He is admitted with a dx of hypotension and tachycardia.   Clinical Impression   PTA pt lived at home and required assistance from his wife for bathing and dressing. Pt was able to perform transfers with use of a RW and his prosthesis and was active with HHPT/HHOT. Pt is close to baseline and will benefit from continued HHOT at home. No further acute OT needs. Educated pt/wife on tub bench and recommended for tub transfers.     Follow Up Recommendations  Home health OT    Equipment Recommendations  Tub/shower bench    Recommendations for Other Services       Precautions / Restrictions Precautions Precautions: Fall;Other (comment) Precaution Comments: tachy Restrictions Weight Bearing Restrictions: No      Mobility Bed Mobility Overal bed mobility: Needs Assistance Bed Mobility: Supine to Sit     Supine to sit: Supervision;HOB elevated     General bed mobility comments: Pt sitting in recliner when OT arrived.   Transfers Overall transfer level: Needs assistance Equipment used: None Transfers: Squat Pivot Transfers     Squat pivot transfers: Min guard     General transfer comment: verbal cues for safety; pt prefers no physical assist. Used drop-arm recliner.         ADL  Overall ADL's : Needs assistance/impaired Eating/Feeding: Independent;Sitting   Grooming: Set up;Sitting   Upper Body Bathing: Set up;Sitting   Lower Body Bathing: Minimal assistance;Sit to/from stand   Upper Body Dressing : Set up;Sitting   Lower Body Dressing: Moderate assistance;Sit to/from stand   Toilet Transfer: Min guard;Squat-pivot                             Pertinent Vitals/Pain Pain Assessment: No/denies pain     Hand Dominance Right   Extremity/Trunk Assessment Upper Extremity Assessment Upper Extremity Assessment: Overall WFL for tasks assessed (shoulder strength ~4+/5 Bil; otherwise 5/5)   Lower Extremity Assessment Lower Extremity Assessment: Defer to PT evaluation   Cervical / Trunk Assessment Cervical / Trunk Assessment: Normal   Communication Communication Communication: No difficulties   Cognition Arousal/Alertness: Awake/alert Behavior During Therapy: WFL for tasks assessed/performed Overall Cognitive Status: Within Functional Limits for tasks assessed                                Home Living Family/patient expects to be discharged to:: Private residence Living Arrangements: Spouse/significant other Available Help at Discharge: Family Type of Home: House Home Access: Ramped entrance     Home Layout: Two level;Able to live on main level with bedroom/bathroom     Bathroom Shower/Tub: Tub/shower unit Shower/tub characteristics: Curtain       Home Equipment: Environmental consultantWalker - 2 wheels;Shower seat;Wheelchair - power;Bedside commode          Prior Functioning/Environment Level of  Independence: Needs assistance  Gait / Transfers Assistance Needed: supervision to min assist with transfers. Pt primarily uses power w/c. ADL's / Homemaking Assistance Needed: pt sponge bathes since THA; cannot transfer into tub. Wife assists with LB ADLs.         OT Diagnosis: Generalized weakness    End of Session  Activity Tolerance:  Patient tolerated treatment well Patient left: in chair;with call bell/phone within reach;with family/visitor present   Time: [id number] OT Time Calculation (min): 28 min Charges:  OT General Charges $OT Visit: 1 Procedure OT Evaluation $Initial OT Evaluation Tier I: 1 Procedure OT Treatments $Self Care/Home Management : 8-22 mins G-Codes: OT G-codes **NOT FOR INPATIENT CLASS** Functional Assessment Tool Used: clinical judgement Functional Limitation: Self care Self Care Current Status (O1308(G8987): At least 20 percent but less than 40 percent impaired, limited or restricted Self Care Goal Status (M5784(G8988): At least 20 percent but less than 40 percent impaired, limited or restricted Self Care Discharge Status [phone]): At least 20 percent but less than 40 percent impaired, limited or restricted  Rae LipsMiller, Sakib Noguez M 07/27/2014, 1:45 PM  Carney LivingLeeAnn Marie Bao Bazen, OTR/L Occupational Therapist [phone] (pager)

## 2014-07-27 NOTE — Progress Notes (Signed)
Pt. Is to be transferred to 3 west. Attempted to call report, RN to call back.

## 2014-07-27 NOTE — Progress Notes (Signed)
  Echocardiogram 2D Echocardiogram has been performed.  Cathie BeamsGREGORY, Lonald Troiani 07/27/2014, 4:28 PM
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
## 2014-07-27 NOTE — Care Management Note (Signed)
Case Management Note  Patient Details  Name: [name] [name] MRN: [id number] Date of Birth: 03/03/1946  Subjective/Objective:           NCM spoke with patient and spouse, [facility], and [facility], they did not want a Child psychotherapistocial Worker. [facility] and notified them that patient is getting close to dc today or tomorrow.           Action/Plan:   Expected Discharge Date:  07/28/14               Expected Discharge Plan:  Home w Home Health Services  In-House Referral:     Discharge planning Services  CM Consult  Post Acute Care Choice:  Resumption of Svcs/PTA Provider Choice offered to:  Patient, Spouse  DME Arranged:    DME Agency:     HH Arranged:  RN, PT, OT [facility]  Status of Service:  In process, will continue to follow  Medicare Important Message Given:  No Date Medicare IM Given:    Medicare IM give by:    Date Additional Medicare IM Given:    Additional Medicare Important Message give by:     If discussed at Long Length of Stay Meetings, dates discussed:    Additional Comments:  Leone Havenaylor, Velta [place]olt [place], RN 07/27/2014, 12:23 PM
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
## 2014-07-27 NOTE — Progress Notes (Signed)
  Date: 07/27/2014  Patient name: [name] [name]  Medical record number: [id number]  Date of birth: 07/29/1945   This patient has been seen and the plan of care was discussed with the house staff. Please see their note for complete details. I concur with their findings with the following additions/corrections: Remains tachy and no evidence of vol contraction - Cr the best it has been in 6 months, not orthostatic, bicarb not high, has been hydrated and is now eating well. Dr Glenard HaringModing plans to discuss antiarrhy with cards. Await ECHO results.   Burns SpainElizabeth A Devery Murgia, MD 07/27/2014, 10:59 AM

## 2014-07-27 NOTE — Telephone Encounter (Signed)
Call to patient to confirm appointment for 07/28/14 at 9:45 lmtcb

## 2014-07-27 NOTE — Evaluation (Signed)
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 He underwent a left total hip replacement on 06/14/2014 following a left femoral neck fracture, and he was released from rehabilitation 2 to 3 weeks ago. He reports generalized weakness since Friday associated with a reduced appetite.  He is admitted with a dx of hypotension and tachycardia.  Clinical Impression  Pt admitted with above diagnosis. Pt currently with functional limitations due to the deficits listed below (see PT Problem List). Eval limited to transfers due to HR of 145 at rest. Pt will benefit from skilled PT to increase their independence and safety with mobility to allow discharge to the venue listed below.       Follow Up Recommendations Home health PT;Supervision/Assistance - 24 hour    Equipment Recommendations  None recommended by PT    Recommendations for Other Services       Precautions / Restrictions Precautions Precautions: Fall;Other (comment) Precaution Comments: tachy (145 on eval) Restrictions Weight Bearing Restrictions: No      Mobility  Bed Mobility Overal bed mobility: Needs Assistance Bed Mobility: Supine to Sit     Supine to sit: Supervision;HOB elevated     General bed mobility comments: use of bedrail; supervision for safety  Transfers Overall transfer level: Needs assistance Equipment used: None Transfers: Squat Pivot Transfers     Squat pivot transfers: Min guard     General transfer comment: verbal cues for safety; pt prefers no physical assist. Used drop-arm  recliner.  Ambulation/Gait                Stairs            Wheelchair Mobility    Modified Rankin (Stroke Patients Only)       Balance                                             Pertinent Vitals/Pain Pain Assessment: No/denies pain    Home Living Family/patient expects to be discharged to:: Private residence Living Arrangements: Spouse/significant other Available Help at Discharge: Family Type of Home: House Home Access: Ramped entrance     Home Layout: Two level;Able to live on main level with bedroom/bathroom Home Equipment: Dan HumphreysWalker - 2 wheels;Shower seat;Wheelchair - power;Bedside commode      Prior Function Level of Independence: Needs assistance   Gait / Transfers Assistance Needed: supervision to min assist with transfers. Pt primarily uses power w/c.  ADL's / Homemaking Assistance Needed: assist with ADLs        Hand Dominance   Dominant Hand: Right    Extremity/Trunk Assessment   Upper Extremity Assessment: Defer to OT evaluation           Lower Extremity Assessment: Generalized weakness         Communication   Communication: No difficulties  Cognition Arousal/Alertness: Awake/alert Behavior During Therapy: WFL for tasks assessed/performed Overall Cognitive Status: Within Functional Limits for tasks assessed                      General  Comments      Exercises        Assessment/Plan    PT Assessment Patient needs continued PT services  PT Diagnosis Difficulty walking;Generalized weakness   PT Problem List Decreased strength;Decreased activity tolerance;Decreased balance;Decreased mobility;Cardiopulmonary status limiting activity;Decreased safety awareness  PT Treatment Interventions DME instruction;Gait training;Functional mobility training;Therapeutic activities;Therapeutic exercise;Patient/family education;Balance training   PT Goals (Current goals can be found in the Care Plan section)  Acute Rehab PT Goals Patient Stated Goal: home PT Goal Formulation: With patient Time For Goal Achievement: 08/10/14 Potential to Achieve Goals: Good    Frequency Min 3X/week   Barriers to discharge        Co-evaluation               End of Session   Activity Tolerance: Patient tolerated treatment well;Treatment limited secondary to medical complications (Comment) (tachy) Patient left: in chair;with family/visitor present;with call bell/phone within reach Nurse Communication: Mobility status    Functional Assessment Tool Used: clinical judgement Functional Limitation: Mobility: Walking and moving around Mobility: Walking and Moving Around Current Status ([id number](G8978): At least 20 percent but less than 40 percent impaired, limited or restricted Mobility: Walking and Moving Around Goal Status [phone](G8979): At least 1 percent but less than 20 percent impaired, limited or restricted    Time: [id number] PT Time Calculation (min) (ACUTE ONLY): 24 min   Charges:   PT Evaluation $Initial PT Evaluation Tier I: 1 Procedure PT Treatments $Therapeutic Activity: 8-22 mins   PT G Codes:   PT G-Codes **NOT FOR INPATIENT CLASS** Functional Assessment Tool Used: clinical judgement Functional Limitation: Mobility: Walking and moving around Mobility: Walking and Moving Around Current Status (H0865(G8978): At least 20 percent but less than 40 percent impaired, limited or restricted Mobility: Walking and Moving Around Goal Status [phone](G8979): At least 1 percent but less than 20 percent impaired, limited or restricted    Ilda FoilGarrow, Vanna Sailer Rene 07/27/2014, 11:38 AM

## 2014-07-28 ENCOUNTER — Encounter (HOSPITAL_COMMUNITY): Payer: Self-pay | Admitting: Nurse Practitioner

## 2014-07-28 ENCOUNTER — Encounter: Payer: Medicare PPO | Admitting: Internal Medicine

## 2014-07-28 DIAGNOSIS — Z9861 Coronary angioplasty status: Secondary | ICD-10-CM

## 2014-07-28 DIAGNOSIS — E1122 Type 2 diabetes mellitus with diabetic chronic kidney disease: Secondary | ICD-10-CM

## 2014-07-28 DIAGNOSIS — N183 Chronic kidney disease, stage 3 (moderate): Secondary | ICD-10-CM

## 2014-07-28 DIAGNOSIS — I4892 Unspecified atrial flutter: Principal | ICD-10-CM

## 2014-07-28 DIAGNOSIS — I4510 Unspecified right bundle-branch block: Secondary | ICD-10-CM

## 2014-07-28 DIAGNOSIS — I10 Essential (primary) hypertension: Secondary | ICD-10-CM

## 2014-07-28 LAB — BASIC METABOLIC PANEL
Anion gap: 8 (ref 5–15)
BUN: 51 mg/dL — ABNORMAL HIGH (ref 6–20)
CO2: 16 mmol/L — ABNORMAL LOW (ref 22–32)
Calcium: 7.6 mg/dL — ABNORMAL LOW (ref 8.9–10.3)
Chloride: 115 mmol/L — ABNORMAL HIGH (ref 101–111)
Creatinine, Ser: 2.05 mg/dL — ABNORMAL HIGH (ref 0.61–1.24)
GFR calc Af Amer: 36 mL/min — ABNORMAL LOW (ref >60)
GFR calc non Af Amer: 31 mL/min — ABNORMAL LOW (ref >60)
Glucose, Bld: 97 mg/dL (ref 70–99)
Potassium: 5.1 mmol/L (ref 3.5–5.1)
Sodium: 139 mmol/L (ref 135–145)

## 2014-07-28 LAB — GLUCOSE, CAPILLARY
GLUCOSE-CAPILLARY: 86 mg/dL (ref 70–99)
Glucose-Capillary: 111 mg/dL — ABNORMAL HIGH (ref 70–99)
Glucose-Capillary: 90 mg/dL (ref 70–99)

## 2014-07-28 LAB — CBC
HCT: 25 % — ABNORMAL LOW (ref 39.0–52.0)
Hemoglobin: 8 g/dL — ABNORMAL LOW (ref 13.0–17.0)
MCH: 23.3 pg — ABNORMAL LOW (ref 26.0–34.0)
MCHC: 32 g/dL (ref 30.0–36.0)
MCV: 72.9 fL — ABNORMAL LOW (ref 78.0–100.0)
Platelets: 194 10*3/uL (ref 150–400)
RBC: 3.43 MIL/uL — ABNORMAL LOW (ref 4.22–5.81)
RDW: 17.1 % — ABNORMAL HIGH (ref 11.5–15.5)
WBC: 7.8 10*3/uL (ref 4.0–10.5)

## 2014-07-28 MED ORDER — APIXABAN 5 MG PO TABS: 5.0000 mg | ORAL_TABLET | Freq: Two times a day (BID) | ORAL | Status: DC

## 2014-07-28 MED ORDER — APIXABAN 5 MG PO TABS
5.0000 mg | ORAL_TABLET | Freq: Two times a day (BID) | ORAL | Status: DC
  Administered 2014-07-28: 5 mg via ORAL
  Filled 2014-07-28: qty 1

## 2014-07-28 MED ORDER — AMIODARONE HCL 200 MG PO TABS: 200.0000 mg | ORAL_TABLET | Freq: Two times a day (BID) | ORAL | Status: DC

## 2014-07-28 MED ORDER — FAMOTIDINE 20 MG PO TABS: 20.0000 mg | ORAL_TABLET | Freq: Two times a day (BID) | ORAL | Status: DC

## 2014-07-28 MED ORDER — AMIODARONE HCL 200 MG PO TABS
200.0000 mg | ORAL_TABLET | Freq: Two times a day (BID) | ORAL | Status: DC
  Administered 2014-07-28: 200 mg via ORAL
  Filled 2014-07-28: qty 1

## 2014-07-28 NOTE — Progress Notes (Signed)
[facility], Misty StanleyLisa with [facility] called and made aware of discharge home today; DME ordered through [employer] ( patient's insurance provider [facility]) to be delivered to his home; Alexis GoodellB Seville Brick RN,BSN,MHA [phone]
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
## 2014-07-28 NOTE — Progress Notes (Signed)
PT Cancellation Note  Patient Details Name: [name] [name] MRN: [id number] DOB: 04/17/1945   Cancelled Treatment:    Reason Eval/Treat Not Completed: Other (comment); patient and wife report plan to d/c today.  Patient with condom cath and not wanting to undergo effort to get on prosthesis to walk if planned d/c later today.  Will cancel in light of probable d/c and did participate with PT yesterday.   Giovan Pinsky,CYNDI 07/28/2014, 2:04 PM  Sheran Lawlessyndi Brennon Otterness, PT [phone] 07/28/2014

## 2014-07-28 NOTE — Progress Notes (Signed)
According to tele monitor record, pt converted to NSR since 0201 AM today. An EKG performed this morning also confirms that pt is now into NSR. Cardiology team notified. Will continue to monitor Pt.  Colleen Canesar Halbert Jesson, RN.

## 2014-07-28 NOTE — Discharge Instructions (Signed)

## 2014-07-28 NOTE — Progress Notes (Signed)
  Date: 07/28/2014  Patient name: [name] [name]  Medical record number: [id number]  Date of birth: 08/07/1945   This patient has been seen and the plan of care was discussed with the house staff. Please see their note for complete details. I concur with their findings with the following additions/corrections: Still feeling well. No complaints. Amiodarone has been converted to PO. Rate and rhythm controlled. Now on DOAC. GI prophylaxis with either H2B or PPI. F/U as outpt.  Burns SpainElizabeth A Shanetra Blumenstock, MD 07/28/2014, 1:13 PM

## 2014-07-28 NOTE — Progress Notes (Signed)
Benefit check in progress of the co pay of Eliquis

## 2014-07-28 NOTE — Progress Notes (Signed)
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 Hgb 6.6, transfused, no further work up   RBBB   CKD (chronic kidney disease), stage III    SUBJECTIVE  No chest pain or sob.  Converted to sinus during the night.  CURRENT MEDS . apixaban  5 mg Oral BID  . aspirin  325 mg Oral Daily  . calcium-vitamin D  0.5 tablet Oral Q breakfast  . carvedilol  12.5 mg Oral BID WC  . docusate sodium  100 mg Oral BID  . famotidine  20 mg Oral BID  . ferrous sulfate  325 mg Oral Q breakfast  . insulin aspart protamine- aspart  20 Units Subcutaneous BID WC  . lactose free nutrition  237 mL Oral TID WC  . multivitamin with minerals  1 tablet Oral Daily  . sodium chloride  3 mL Intravenous Q12H  . sodium chloride  3 mL Intravenous Q12H    OBJECTIVE  Filed Vitals:   07/27/14 2025 07/27/14 2339 07/28/14 0442 07/28/14 0846  BP: 90/68 100/64 110/55 90/53  Pulse: 97  64   Temp: 99.3 F (37.4 C)  99.2 F (37.3 C)   TempSrc: Oral  Oral   Resp: 18  18   Height:      Weight:   194 lb 8 oz (88.225 kg)   SpO2: 100%  99%     Intake/Output Summary (Last 24 hours) at 07/28/14 1259 Last data filed at 07/28/14 0836  Gross per 24 hour  Intake    120 ml  Output    300 ml  Net   -180 ml   Filed Weights   07/26/14 1547 07/27/14 1314 07/28/14 0442  Weight: 189 lb 13.1 oz (86.1 kg) 189 lb 13.1 oz (86.1 kg) 194 lb 8 oz (88.225 kg)   PHYSICAL EXAM  General: Pleasant, NAD. Neuro: Alert and oriented X 3. Moves all extremities spontaneously. Psych: Normal affect. HEENT:  Normal  Neck: Supple  without bruits or JVD. Lungs:  Resp regular and unlabored, CTA. Heart: RRR no s3, s4, or murmurs. Abdomen: Soft, non-tender, non-distended, BS + x 4.  Extremities: No clubbing, cyanosis.  Trace to 1+ LLE edema. R AKA. DP/PT/Radials 2+ and equal bilaterally.  Accessory Clinical Findings  CBC  Recent Labs  07/27/14 0100 07/28/14 0243  WBC 6.5 7.8  HGB 8.4* 8.0*  HCT 26.2* 25.0*  MCV 73.2* 72.9*  PLT 230 194   Basic Metabolic Panel  Recent Labs  07/26/14 1454 07/27/14 0100 07/28/14 0243  NA  --  141 139  K  --  4.5 5.1  CL  --  118* 115*  CO2  --  17* 16*  GLUCOSE  --  94 97  BUN  --  53* 51*  CREATININE  --  1.77* 2.05*  CALCIUM  --  7.6* 7.6*  MG 2.0 1.9  --    Cardiac Enzymes  Recent Labs  07/26/14 1454 07/26/14 1927 07/27/14 0100  TROPONINI <0.03 <0.03 <0.03   D-Dimer  Recent Labs  07/25/14 1942  DDIMER 0.65*   Thyroid Function Tests  Recent Labs  07/26/14 1454  TSH 3.200    TELE  Converted to sinus rhythm just after 2 am this morning.  Radiology/Studies  Dg Chest 2 View  07/26/2014   CLINICAL DATA:  Hypoxia with ambulation, abnormal chest x-ray questionable infiltrate, history hypertension, hyperlipidemia, type 2 diabetes, coronary artery disease, former smoker  EXAM: CHEST  2 VIEW  COMPARISON:  07/25/2014  FINDINGS: Borderline enlargement of cardiac silhouette.  Mildly tortuous aorta.  Mediastinal contours and pulmonary vascularity otherwise normal.  Probable minimal bibasilar atelectasis.  No definite infiltrate, pleural effusion or pneumothorax.  IMPRESSION: Probable minimal bibasilar atelectasis without definite infiltrate.   Electronically Signed   By: Ulyses SouthwardMark  Boles M.D.   On: 07/26/2014 08:37   Dg Chest Port 1 View  07/25/2014   CLINICAL DATA:  69 year old male with a history of generalized weakness.  EXAM: PORTABLE CHEST - 1 VIEW  COMPARISON:  06/16/2014, 06/15/2014, 06/13/2014  FINDINGS: Cardiomediastinal silhouette is unchanged, with  borderline cardiomegaly.  No evidence of pulmonary vascular congestion.  Lung volumes are low. This extends to rates the interstitium with coarsened appearance of a lung base interstitial opacities. No pneumothorax or large pleural effusion. No confluent airspace disease.  No displaced fracture.  IMPRESSION: Lung volumes are low, with prominent interstitium at the lung bases. This may reflect atelectasis, though a developing infiltrate is difficult to exclude.  Signed,  Yvone NeuJaime S. Loreta AveWagner, DO  [facility]  [facility]   Electronically Signed   By: Gilmer MorJaime  Wagner D.O.   On: 07/25/2014 14:21    ASSESSMENT AND PLAN  1. Aflutter with RVR:  Converted to sinus on amio overnight.  Asymptomatic this AM.  Eliquis started for CHA2DS2VASc = 4.  Given ongoing co-morbidities and recovery from hip surgery, suspect risk of recurrent atrial flutter is high.  Therefor, we will focus on rhythm control and convert amio from IV to po - 200 mg BID initially with eventual goal of 200 mg daily.  Cont bb as bp allows.  D/C ASA.  2.  CAD:  No chest pain or sob.  Trop neg.  Echo pending.  Cont bb. D/C asa since he is now on eliquis.  Prev statin intolerant.  3.  Essential HTN:  Stable to hypotensive.  Follow.  4.  CKD III:  Stable.  5.  DM II:  Per IM.  6.  Microcytic anemia:  Stable.  Outpt GI f/u planned.  Signed, Nicolasa Duckinghristopher Berge NP   I have seen and examined the patient along with Nicolasa Duckinghristopher Berge NP.  I have reviewed the chart, notes and new data.  I agree with PA/NP's note.  Atrial flutter has resolved, but agree with the assessment of high recurrence risk. Keep on amiodarone 200 mg BID for about 3 weeks and then maintenance 200 mg daily. Anticoagulation is indicated for at least the next 30 days, preferably lifelong. Reassess safety of anticoagulation based on progression of anemia and etiology of Fe deficiency.  Thurmon FairMihai Pricsilla Lindvall, MD, [facility]  [facility] [phone] 07/28/2014, 1:32 PM

## 2014-07-28 NOTE — Progress Notes (Signed)
ANTICOAGULATION CONSULT NOTE - Initial Consult  Pharmacy Consult:  Eliquis Indication:  Non-valvular Afib  Allergies  Allergen Reactions  . Amoxicillin Swelling and Other (See Comments)    Puffy eyes and abdominal pain with Amoxicillin PO  . Pravastatin Other (See Comments)    Weakness and fatigue  . Tamsulosin Other (See Comments)    REACTION: dry throat, sweating, blurred vision  . Doxycycline Rash and Other (See Comments)    Questionable drug rxn rash    Patient Measurements: Height: 5\' 9"  (175.3 cm) Weight: 194 lb 8 oz (88.225 kg) IBW/kg (Calculated) : 70.7  Vital Signs: Temp: 99.2 F (37.3 C) (05/06 0442) Temp Source: Oral (05/06 0442) BP: 90/53 mmHg (05/06 0846) Pulse Rate: 64 (05/06 0442)  Labs:  Recent Labs  07/25/14 1245  07/26/14 0554 07/26/14 1454 07/26/14 1927 07/27/14 0100 07/28/14 0243  HGB  --   < > 8.7*  --   --  8.4* 8.0*  HCT  --   < > 26.9*  --   --  26.2* 25.0*  PLT  --   < > 226  --   --  230 194  LABPROT 15.4*  --   --   --   --   --   --   INR 1.21  --   --   --   --   --   --   CREATININE  --   < > 1.97*  --   --  1.77* 2.05*  TROPONINI <0.03  --   --  <0.03 <0.03 <0.03  --   < > = values in this interval not displayed.  Estimated Creatinine Clearance: 36.3 mL/min (by C-G formula based on Cr of 2.05).   Medical History: Past Medical History  Diagnosis Date  . Hyperlipidemia LDL goal < 100 04/02/2006  . Essential hypertension 12/09/2006  . Benign prostatic hypertrophy with nocturia 07/07/2006  . Chronic venous insufficiency 04/12/2010  . Obesity (BMI 30.0-34.9) 01/15/2012  . Constipation 05/25/2009    Intermittent   . Microcytic normochromic anemia 05/27/2006  . Seasonal allergic rhinitis 05/25/2009  . Internal and external hemorrhoids without complication 08/20/2012  . Coronary artery disease 04/02/2006    s/p 2 stents, specifics unknown   . Type 2 diabetes mellitus with neurological manifestations 04/02/2006    Neuropathy of the left foot    . 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 Type II diabetes mellitus   . History of blood transfusion 05/2014    "related to OR"  . GERD (gastroesophageal reflux disease)   . Degenerative joint disease involving multiple joints 02/02/2007  . Arthritis     "fingers" (07/25/2014)  . Type 2 diabetes mellitus with stage 3 chronic kidney disease       Assessment: [age] YOM with Afib (CHADsVASc = 4) to start Eliquis for anticoagulation.  Based on patient's specific parameters, will start patient on full dose of Eliquis.  Baseline labs reviewed.   Goal of Therapy:  Full anticoagulation Monitor platelets by anticoagulation protocol: Yes    Plan:  - D/C heparin SQ - Eliquis 5mg  PO BID - Education provided - Pharmacy will sign off and follow peripherally.  Thank you for  the consult!    Laelah Siravo D. Laney Potashang, PharmD, BCPS Pager:  [phone] 07/28/2014, 1:29 PM

## 2014-07-31 LAB — CULTURE, BLOOD (ROUTINE X 2)
CULTURE: NO GROWTH
Culture: NO GROWTH

## 2014-07-31 SURGERY — ECHOCARDIOGRAM, TRANSESOPHAGEAL
Anesthesia: Monitor Anesthesia Care

## 2014-08-01 ENCOUNTER — Encounter: Payer: Self-pay | Admitting: Internal Medicine

## 2014-08-02 ENCOUNTER — Encounter: Payer: Self-pay | Admitting: Internal Medicine

## 2014-08-02 ENCOUNTER — Ambulatory Visit (INDEPENDENT_AMBULATORY_CARE_PROVIDER_SITE_OTHER): Payer: Medicare PPO | Admitting: Internal Medicine

## 2014-08-02 VITALS — BP 145/71 | HR 66 | Temp 98.0°F | Wt 197.4 lb

## 2014-08-02 DIAGNOSIS — I4892 Unspecified atrial flutter: Secondary | ICD-10-CM

## 2014-08-02 DIAGNOSIS — I129 Hypertensive chronic kidney disease with stage 1 through stage 4 chronic kidney disease, or unspecified chronic kidney disease: Secondary | ICD-10-CM

## 2014-08-02 DIAGNOSIS — I483 Typical atrial flutter: Secondary | ICD-10-CM

## 2014-08-02 DIAGNOSIS — N401 Enlarged prostate with lower urinary tract symptoms: Secondary | ICD-10-CM

## 2014-08-02 DIAGNOSIS — I2510 Atherosclerotic heart disease of native coronary artery without angina pectoris: Secondary | ICD-10-CM

## 2014-08-02 DIAGNOSIS — Z794 Long term (current) use of insulin: Secondary | ICD-10-CM | POA: Diagnosis not present

## 2014-08-02 DIAGNOSIS — J302 Other seasonal allergic rhinitis: Secondary | ICD-10-CM

## 2014-08-02 DIAGNOSIS — N183 Chronic kidney disease, stage 3 (moderate): Secondary | ICD-10-CM | POA: Diagnosis not present

## 2014-08-02 DIAGNOSIS — D631 Anemia in chronic kidney disease: Secondary | ICD-10-CM

## 2014-08-02 DIAGNOSIS — R351 Nocturia: Secondary | ICD-10-CM

## 2014-08-02 DIAGNOSIS — E1122 Type 2 diabetes mellitus with diabetic chronic kidney disease: Secondary | ICD-10-CM

## 2014-08-02 DIAGNOSIS — I10 Essential (primary) hypertension: Secondary | ICD-10-CM

## 2014-08-02 DIAGNOSIS — K5900 Constipation, unspecified: Secondary | ICD-10-CM

## 2014-08-02 DIAGNOSIS — K219 Gastro-esophageal reflux disease without esophagitis: Secondary | ICD-10-CM

## 2014-08-02 NOTE — Assessment & Plan Note (Signed)
He has seasonal allergic rhinitis at this time of the year, but his symptoms are well controlled on the nasal steroid. We will therefore continue with the nasal steroid as needed for his allergic rhinitis. We will reassess his symptoms at the follow-up visit.

## 2014-08-02 NOTE — Assessment & Plan Note (Signed)
His blood pressure today was 145/71 on carvedilol 12.5 mg by mouth twice daily. In the hospital his amlodipine 10 mg by mouth daily, lisinopril 10 mg by mouth daily, and terazosin 10 mg by mouth nightly were discontinued secondary to hypotension when he was tachycardic. With the elevated blood pressure today we decided to restart the amlodipine at 10 mg by mouth daily as he was having no symptoms of significant nocturia and has had difficulty with the initiation of lisinopril in the past as noted above. We will reassess his blood pressure at the follow-up visit and if he requires further escalation in his pharmacotherapy will likely restart the terazosin before restarting the lisinopril unless he has significant microalbuminuria or proteinuria.

## 2014-08-02 NOTE — Patient Instructions (Signed)
It was great to see you out of the hospital for once.  1) Restart the amlodipine 10 mg by mouth daily for the blood pressure.  2) Hold the Asprin for now.  I will restart it when you have recovered a bit more.  3) Keep taking the other medications as you are.  4) We checked a urine for protein to keep up with your kidney function.  5) I ordered pulmonary function tests (breathing tests) to allow me to follow your breathing while taking the amiodarone.  I will see you in 3 months, sooner if necessary.

## 2014-08-02 NOTE — Assessment & Plan Note (Signed)
He recently had a GI bleed and is postop from a left hemiarthroplasty of the left hip. His anemia of chronic renal failure is therefore worsened and he was placed on iron therapy. Unfortunately he does not tolerate the iron well with significant nausea and vomiting. With an improved appetite I feel he will be able to replete his iron stores over time through regular meals and have stopped the iron supplementation which he does not tolerate. We will reassess his iron status and hemoglobin levels in several months to assure these are returning to his baseline anemia related to his chronic renal insufficiency.

## 2014-08-02 NOTE — Assessment & Plan Note (Signed)
He has very rare symptoms of gastroesophageal reflux disease which responds well to as needed famotidine. We will therefore continue the famotidine, but do so on an as-needed basis.

## 2014-08-02 NOTE — Assessment & Plan Note (Signed)
He would like to defer his zostavax at this time for financial reasons. We will readdress at the follow-up visit. Otherwise he is up-to-date on his health care maintenance.

## 2014-08-02 NOTE — Assessment & Plan Note (Signed)
His most recent hemoglobin A1c was 6.3 which is well within target. This is on NovoLog 70/30 30 units twice daily and an insulin sliding scale. He notes very very infrequent hypoglycemic episodes. A diabetic foot exam was done today and a urine was collected for microalbumin and is pending at the time of this dictation. He is otherwise up-to-date on his diabetic health care maintenance. We will continue with his insulin regimen at the current doses. We will reassess diabetic control at the follow-up visit with a repeat hemoglobin A1c.  While in the hospital his creatinine was 2.05, slightly elevated above his baseline. His ACE inhibitor was discontinued once again given the acute renal insufficiency and associated hyperkalemia. It seems he frequently has difficulty when taking an ACE inhibitor and the decision to restart will be a difficult one, especially if he does not have microalbuminuria.

## 2014-08-02 NOTE — Assessment & Plan Note (Signed)
As noted above we are holding the aspirin 81 mg by mouth daily given his recent GI bleed, anemia, and need to start a pixel been for his atrial flutter. Once he recuperates from his acute sequelae from his left hip fracture and hemiarthroplasty we will restart the aspirin 81 mg by mouth daily because of his underlying coronary artery disease that has required a drug-eluting stent in the past. In the meantime, we are continuing the current rate 12.5 mg by mouth twice daily.

## 2014-08-02 NOTE — Assessment & Plan Note (Signed)
He has never had symptomatic tachycardia other than a feeling of mild lightheadedness. Since discharge he has noted no lightheadedness or palpitations. He also denies any shortness of breath or orthopnea. Examination was notable for a regular rate and rhythm. He will continue on amiodarone 200 mg by mouth twice daily until June 1 when he will convert to the 200 mg by mouth daily dose for maintenance. A recent TSH was normal and baseline pulmonary function tests were ordered at this visit. He is tolerating the apixoban well and this will be continued at 5 mg by mouth twice daily. For at least the next month or 2 we will hold the aspirin 81 mg by mouth daily given the recent GI bleeding and surgical blood loss. I fully anticipate that once he has stabilized more he should be able to tolerate the low-dose aspirin which he requires given his coronary artery disease. We will also continue the Coreg at 12.5 mg by mouth twice daily for rate control should he convert back into atrial flutter.

## 2014-08-02 NOTE — Progress Notes (Signed)
   Subjective:    Patient ID: [name] [name], male    DOB: 05/21/1945, 69 y.o.   MRN: [id number]  HPI  [name] [name] is here for hospital follow-up after an episode of atrial flutter with 2:1 conduction. While in the hospital he was started on amiodarone and spontaneously converted to normal sinus rhythm. He was also started on apixoban anticoagulation. Since discharge he has done well without any episodes of dizziness, fatigue, or shortness of breath. His appetite is improving and he is scheduled to restart working with physical therapy tomorrow to improve the strength in his lower extremities. He is without any acute complaints.  Please see the A&P for the status of the pt's chronic medical problems.  Review of Systems  Constitutional: Positive for activity change and appetite change. Negative for fever, fatigue and unexpected weight change.       Improvement in strength and appetite.  HENT: Positive for postnasal drip and rhinorrhea.        Mild allergic rhinitis symptoms responding well to nasal steroids.  Respiratory: Negative for cough, choking, chest tightness, shortness of breath and wheezing.   Cardiovascular: Negative for chest pain and palpitations.  Gastrointestinal: Positive for constipation. Negative for nausea, vomiting, abdominal pain, diarrhea and abdominal distention.  Genitourinary: Negative for urgency, frequency and difficulty urinating.  Musculoskeletal: Negative for myalgias and joint swelling.  Skin: Positive for color change.       Slight bruising from IV and blood draw sites.  Neurological: Negative for dizziness, syncope and light-headedness.  Psychiatric/Behavioral: Negative for dysphoric mood and decreased concentration. The patient is not nervous/anxious.       Objective:   Physical Exam  Constitutional: He is oriented to person, place, and time. He appears well-developed and well-nourished. No distress.  HENT:  Head: Normocephalic and atraumatic.    Eyes: Conjunctivae are normal. Right eye exhibits no discharge. Left eye exhibits no discharge. No scleral icterus.  Cardiovascular: Normal rate, regular rhythm and normal heart sounds.  Exam reveals no gallop and no friction rub.   No murmur heard. Pulmonary/Chest: Effort normal and breath sounds normal. No respiratory distress. He has no wheezes. He has no rales.  Abdominal: Soft. Bowel sounds are normal. He exhibits no distension. There is no tenderness. There is no rebound and no guarding.  Musculoskeletal: Normal range of motion. He exhibits no edema or tenderness.  s/p R BKA  Neurological: He is alert and oriented to person, place, and time. He exhibits normal muscle tone.  Skin: Skin is warm and dry. No rash noted. He is not diaphoretic. No erythema.  Psychiatric: He has a normal mood and affect. His behavior is normal. Judgment and thought content normal.  Nursing note and vitals reviewed.     Assessment & Plan:   Please see problem oriented charting.

## 2014-08-02 NOTE — Assessment & Plan Note (Signed)
His constipation is well-controlled with a stool softener and will be continued on an as-needed basis. We will reassess his constipation at the follow-up visit.

## 2014-08-02 NOTE — Assessment & Plan Note (Signed)
Despite the terazosin being discontinued during the most recent hospitalization he denies any nocturia, frequency, or urgency. We will continue to follow him symptomatically, and his symptoms return we will restart the terazosin as tolerated.

## 2014-08-03 ENCOUNTER — Telehealth: Payer: Self-pay | Admitting: *Deleted

## 2014-08-03 LAB — MICROALBUMIN / CREATININE URINE RATIO
Creatinine, Urine: 87 mg/dL
MICROALB UR: 26.9 mg/dL — AB (ref <2.0)
Microalb Creat Ratio: 309.2 mg/g — ABNORMAL HIGH (ref 0.0–30.0)

## 2014-08-03 NOTE — Telephone Encounter (Signed)
Call made to pt to inform him of appt time/date for PFTs.  App[facility] scheduled for Fri May [facility]th [facility]  Pt to arrive at admitting by 9:45am. No caffeine, inhalers, smoking, or "breathing medicines" four hours prior to the test. Message left with pt's wife, phone call complete.Kingsley SpittleGoldston, Darlene Cassady5/12/201611:51 AM

## 2014-08-03 NOTE — Progress Notes (Signed)
Microalbumin 26.9 Creatinine 87 Microalbumin/Creatinine ratio 309.2  Frank albuminuria at this point.  Will try reintroducing ACEI back at the follow-up visit if BP will tolerate.  He has had some difficulty with ACEI in the past (hyperkalemia and AKI which also could have been secondary to acute health events), so this will have to be done carefully.

## 2014-08-11 ENCOUNTER — Ambulatory Visit (HOSPITAL_COMMUNITY)
Admission: RE | Admit: 2014-08-11 | Discharge: 2014-08-11 | Disposition: A | Discharge status: Home / Self Care | Payer: Medicare PPO | Source: Ambulatory Visit | Attending: Internal Medicine | Admitting: Internal Medicine

## 2014-08-11 DIAGNOSIS — Z79899 Other long term (current) drug therapy: Secondary | ICD-10-CM | POA: Diagnosis not present

## 2014-08-11 DIAGNOSIS — I483 Typical atrial flutter: Secondary | ICD-10-CM

## 2014-08-11 LAB — PULMONARY FUNCTION TEST
DL/VA % pred: 89 %
DL/VA: 4.01 ml/min/mmHg/L
DLCO COR: 18.09 ml/min/mmHg
DLCO cor % pred: 61 %
DLCO unc % pred: 45 %
DLCO unc: 13.5 ml/min/mmHg
FEF 25-75 PRE: 3.24 L/s
FEF2575-%Pred-Pre: 144 %
FEV1-%PRED-PRE: 80 %
FEV1-Pre: 2.1 L
FEV1FVC-%PRED-PRE: 125 %
FEV6-%PRED-PRE: 66 %
FEV6-Pre: 2.2 L
FEV6FVC-%Pred-Pre: 105 %
FVC-%Pred-Pre: 62 %
FVC-Pre: 2.2 L
PRE FEV1/FVC RATIO: 95 %
Pre FEV6/FVC Ratio: 100 %
RV % pred: 126 %
RV: 2.99 L
TLC % PRED: 90 %
TLC: 6 L

## 2014-08-15 ENCOUNTER — Telehealth: Payer: Self-pay | Admitting: *Deleted

## 2014-08-15 NOTE — Telephone Encounter (Signed)
A lady called [phone] - pt needs  new prothesis for right leg. Used [employer] in past - pt has lost a lot of weight. Unable to use current prothesis want home visit. Suggest for her to call [employer] and talk to them. Stanton KidneyDebra Aerika Groll RN 08/15/14 3:30PM

## 2014-08-17 ENCOUNTER — Telehealth: Payer: Self-pay | Admitting: *Deleted

## 2014-08-17 NOTE — Telephone Encounter (Signed)
A lady called [phone] about prothesis. She was informed [employer] will not contact doctor's office for Rx on prothesis. Talked with Ellwood Dense. [place] - states she will call [employer] and start process. Stanton KidneyDebra Machele Deihl RN 08/17/14 2:30PM

## 2014-08-18 ENCOUNTER — Telehealth: Payer: Self-pay | Admitting: *Deleted

## 2014-08-18 NOTE — Telephone Encounter (Signed)
OT with [facility] [phone]  left message - pt cancel visit this AM - EMT saw pt last PM 08/17/14 CBG 24. Called pt this AM -  Pt is feeling better. CBG this AM 124. Offered an appt today - prefers not to come to clinic. Pt states did not eat 08/17/14. Called OT that clinic talked with pt this AM. Stanton Kidneyebra Keyan Folson RN 08/18/14 10:10AM

## 2014-08-22 ENCOUNTER — Telehealth: Payer: Self-pay | Admitting: *Deleted

## 2014-08-22 ENCOUNTER — Ambulatory Visit (INDEPENDENT_AMBULATORY_CARE_PROVIDER_SITE_OTHER): Payer: Medicare PPO | Admitting: Internal Medicine

## 2014-08-22 VITALS — BP 169/81 | HR 74 | Temp 98.7°F | Ht 68.0 in

## 2014-08-22 DIAGNOSIS — I10 Essential (primary) hypertension: Secondary | ICD-10-CM

## 2014-08-22 DIAGNOSIS — N183 Chronic kidney disease, stage 3 unspecified: Secondary | ICD-10-CM

## 2014-08-22 DIAGNOSIS — Z794 Long term (current) use of insulin: Secondary | ICD-10-CM

## 2014-08-22 DIAGNOSIS — I129 Hypertensive chronic kidney disease with stage 1 through stage 4 chronic kidney disease, or unspecified chronic kidney disease: Secondary | ICD-10-CM | POA: Diagnosis not present

## 2014-08-22 DIAGNOSIS — E1159 Type 2 diabetes mellitus with other circulatory complications: Secondary | ICD-10-CM

## 2014-08-22 DIAGNOSIS — E1122 Type 2 diabetes mellitus with diabetic chronic kidney disease: Secondary | ICD-10-CM

## 2014-08-22 DIAGNOSIS — N521 Erectile dysfunction due to diseases classified elsewhere: Principal | ICD-10-CM

## 2014-08-22 LAB — BASIC METABOLIC PANEL WITH GFR
BUN: 37 mg/dL — ABNORMAL HIGH (ref 6–23)
CHLORIDE: 111 meq/L (ref 96–112)
CO2: 19 mEq/L (ref 19–32)
Calcium: 7.9 mg/dL — ABNORMAL LOW (ref 8.4–10.5)
Creat: 1.69 mg/dL — ABNORMAL HIGH (ref 0.50–1.35)
GFR, EST NON AFRICAN AMERICAN: 40 mL/min — AB
GFR, Est African American: 46 mL/min — ABNORMAL LOW
Glucose, Bld: 157 mg/dL — ABNORMAL HIGH (ref 70–99)
POTASSIUM: 4.2 meq/L (ref 3.5–5.3)
SODIUM: 139 meq/L (ref 135–145)

## 2014-08-22 LAB — GLUCOSE, CAPILLARY: GLUCOSE-CAPILLARY: 158 mg/dL — AB (ref 65–99)

## 2014-08-22 LAB — POCT GLYCOSYLATED HEMOGLOBIN (HGB A1C): HEMOGLOBIN A1C: 6.1

## 2014-08-22 MED ORDER — INSULIN ASPART PROT & ASPART (70-30 MIX) 100 UNIT/ML PEN: 30.0000 [IU] | PEN_INJECTOR | SUBCUTANEOUS | Status: DC

## 2014-08-22 NOTE — Telephone Encounter (Signed)
Call from patients wife stating pt is having a lot of low CBG's   Last night after eating a good meal cbg went down to 50 in evening, pt was treated with food and today CBG was 139 before breakfast. Last Thursday pt had a reading of 24, EMS called and treated. This has happened several times since last OV, always in PM.  He is taking 30 units of 70/30 BID Pt given appointment for today at 2:15

## 2014-08-22 NOTE — Patient Instructions (Signed)
General Instructions: Please continue with insulin 30 units in the morning when blood sugar is higher than 150 Please reduce evening dose of insulin to 15 units. May cut it down to 10 units if you experience another episode of low blood sugar at night  We will check you blood today   Please come back in 2-3 weeks  Please bring your medicines with you each time you come to clinic.  Medicines may include prescription medications, over-the-counter medications, herbal remedies, eye drops, vitamins, or other pills.   Progress Toward Treatment Goals:  Treatment Goal 08/02/2014  Hemoglobin A1C at goal  Blood pressure deteriorated  Prevent falls unchanged    Self Care Goals & Plans:  Self Care Goal 08/22/2014  Manage my medications take my medicines as prescribed; bring my medications to every visit; refill my medications on time; follow the sick day instructions if I am sick  Monitor my health keep track of my blood glucose; check my feet daily  Eat healthy foods eat more vegetables; eat fruit for snacks and desserts; eat baked foods instead of fried foods; eat smaller portions; eat foods that are low in salt; drink diet soda or water instead of juice or soda  Be physically active find an activity I enjoy  Other -    Home Blood Glucose Monitoring 08/02/2014  Check my blood sugar 2 times a day  When to check my blood sugar before breakfast; at bedtime     Care Management & Community Referrals:  Referral 08/02/2014  Referrals made for care management support none needed  Referrals made to community resources none

## 2014-08-22 NOTE — Assessment & Plan Note (Signed)
BP elevated today. Has been off medication recently with hypoglycemia.  Encouraged him to restart taking his medications Follow up in 2 weeks

## 2014-08-22 NOTE — Progress Notes (Signed)
Patient ID: [name] [name], male   DOB: 12/20/1945, 69 y.o.   MRN: [id number]   Subjective:   HPI: TyroneMarley R Jethro [name] is a 69 y.o. gentleman with past medical history below presents for evaluation of recurrent hypoglycemia.   Hypoglycemia: He has been experiencing nocturnal hypoglycemia over the past week with BS dropping to as low as 24. He becomes slightly confused, diaphoretic with palpitations. The first episode happened on Thursday, which is 5 days ago, then on Saturday and also last night when his blood sugar was 54. All episodes have been happening at nighttime. He has not experienced any daytime hypoglycemia. On Thursday, he has required EMS assistance, but he has not been hospitalized with hypoglycemia. He takes NovoLog mix 70/30 30 units in the morning and 30 units in the evening. He has continued to take the 30 units twice a day. She usually does not take his insulin if his blood sugar is less than 150. He checks home glucose monitoring 2-3 times a day, but his glucometer cannot be downloaded. His record shows that morning CBG are between 130 to 150, but he does have significant hyperglycemia (200s) in the evening before dinner.   He states that has not been taking his medications including amlodipine and Coreg due to concern that this might be contributing to his hypoglycemia. He was also suspicious that his recent blood thinner was accounting for low blood sugars.   No other complaints today.  Blood pressure is 169/81 mmHg. He has been off meds for 5 days.    Past Medical History  Diagnosis Date  . Hyperlipidemia LDL goal < 100 04/02/2006  . Essential hypertension 12/09/2006  . Benign prostatic hypertrophy with nocturia 07/07/2006  . Chronic venous insufficiency 04/12/2010  . Obesity (BMI 30.0-34.9) 01/15/2012  . Constipation 05/25/2009    Intermittent   . Microcytic normochromic anemia 05/27/2006  . Seasonal allergic rhinitis 05/25/2009  . Internal and external hemorrhoids without  complication 08/20/2012  . Coronary artery disease 04/02/2006    s/p RCA DES 2006, low risk myoview in 2011  . 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 Type 2 diabetes mellitus with stage 3 chronic kidney disease   . Closed displaced fracture of left femoral neck 06/14/2014    s/p left hip hemiarthroplasty June 14, 2014   . Atrial flutter 07/26/2014    May 2016, CHA2DS2VASc = 4 -> Eliquis, spontaneous conversion to NSR   . Degenerative joint disease involving multiple joints 02/02/2007  . Gastroesophageal reflux disease     ROS: Constitutional:  Denies fevers, chills, diaphoresis, appetite change and fatigue.  Respiratory: Denies SOB, DOE, cough, chest tightness, and wheezing.  CVS: No chest pain, palpitations and leg swelling.  GI: No abdominal pain, nausea, vomiting, bloody stools GU: No dysuria, frequency, hematuria, or flank pain.  MSK: No myalgias, back pain, joint swelling, arthralgias  Psych: No depression symptoms. No SI or SA.    Objective:  Physical Exam: Filed Vitals:   08/22/14 1354  BP: 169/81  Pulse: 74  Temp: 98.7 F (37.1 C)  TempSrc: Oral  Height:   (1.727 m)  SpO2: 100%   General: Well nourished. No acute distress.  HEENT: Normal oral mucosa. MMM.  Lungs: CTA bilaterally. No wheezing. Heart: RRR; no extra sounds or murmurs  Abdomen: Non-distended, normal bowel sounds, soft, nontender; no hepatosplenomegaly  Extremities: No pedal edema. No joint swelling or tenderness. Neurologic: Normal EOM,  Alert and oriented x3. No obvious  neurologic/cranial nerve deficits.  Assessment & Plan:  Discussed case with Dr. Josem Kaufmann See problem based charting for assessment and plan.

## 2014-08-22 NOTE — Assessment & Plan Note (Addendum)
Lab Results  Component Value Date   HGBA1C 6.1 08/22/2014   HGBA1C 6.3 05/26/2014   HGBA1C 7.1 02/10/2014     Assessment: Diabetes control:   Progress toward A1C goal:    Comments: Recent nocturnal hypoglycemias for the past week. He takes insulin Novolog Mix 70/30 30 units bid. He only takes insulin when BS >150 and he skips insulin if BS <150. Good morning glycemic control but tends to have higher readings in the evening. He has no glucometer with him today. No recent wt changes.    Plan: Medications:  I instructed him to reduce evening dose to 15 units but to continue with 30 units in the evening. Skip insulin if BS is less then 150.  I told that he could even reduced his evening dose further to 10 units in case he gets hypoglycemic. Home glucose monitoring: Frequency:   Timing:   Instruction/counseling given: reminded to bring blood glucose meter & log to each visit Educational resources provided: brochure, handout Self management tools provided:   Other plans: Encouraged to check at least 3-4 times a day especially when he feels differently  Will check BMET today to see if low renal clearance might be contributory to his hypoglycemia  Aim for a more liberal A1c goal of about 7.5% given hypoglycemia, advanced age and co-morbidities.  Follow up in 2 weeks

## 2014-08-28 NOTE — Progress Notes (Signed)
Case discussed with Dr. Kazibwe soon after the resident saw the patient.  We reviewed the resident's history and exam and pertinent patient test results.  I agree with the assessment, diagnosis, and plan of care documented in the resident's note. 

## 2014-08-31 ENCOUNTER — Other Ambulatory Visit: Payer: Self-pay | Admitting: *Deleted

## 2014-08-31 DIAGNOSIS — I483 Typical atrial flutter: Secondary | ICD-10-CM

## 2014-08-31 MED ORDER — APIXABAN 5 MG PO TABS: 5.0000 mg | ORAL_TABLET | Freq: Two times a day (BID) | ORAL | Status: DC

## 2014-08-31 MED ORDER — AMIODARONE HCL 200 MG PO TABS: 200.0000 mg | ORAL_TABLET | Freq: Every day | ORAL | Status: DC

## 2014-08-31 NOTE — Telephone Encounter (Signed)
Pt is running out tomorrow am

## 2014-09-05 ENCOUNTER — Ambulatory Visit (INDEPENDENT_AMBULATORY_CARE_PROVIDER_SITE_OTHER): Payer: Medicare PPO | Admitting: Internal Medicine

## 2014-09-05 ENCOUNTER — Encounter: Payer: Self-pay | Admitting: Internal Medicine

## 2014-09-05 VITALS — BP 152/70 | HR 66 | Temp 98.3°F | Ht 68.5 in | Wt 199.2 lb

## 2014-09-05 DIAGNOSIS — N183 Chronic kidney disease, stage 3 (moderate): Secondary | ICD-10-CM | POA: Diagnosis not present

## 2014-09-05 DIAGNOSIS — Z794 Long term (current) use of insulin: Secondary | ICD-10-CM

## 2014-09-05 DIAGNOSIS — E1122 Type 2 diabetes mellitus with diabetic chronic kidney disease: Secondary | ICD-10-CM

## 2014-09-05 NOTE — Progress Notes (Signed)
[facility] Attending  Case discussed with Dr. Kazibwe soon after the resident saw the patient.  We reviewed the resident's history and exam and pertinent patient test results.  I agree with the assessment, diagnosis, and plan of care documented in the resident's note. 

## 2014-09-05 NOTE — Assessment & Plan Note (Addendum)
Lab Results  Component Value Date   HGBA1C 6.1 08/22/2014   HGBA1C 6.3 05/26/2014   HGBA1C 7.1 02/10/2014     Assessment: Diabetes control:   Progress toward A1C goal:    Comments: last saw him 2 weeks ago for recent nocturnal hypoglycemias for one week. He was taking Novolog Mix 70/30 30 units bid which I changed to 30 units in am and 15 units in PM. He has suffered one hypoglycemia episode (BS 50 at night time) since them and he states that he did not eat well that evening and he took his insulin. He only takes insulin when BS >150 and he skips insulin if BS <150. I still note good morning glycemic control but he tends to have higher readings in the evening based on his recordings from a book he has with him. He has no glucometer with him today. No recent wt changes.    Plan: Medications:  Continue with current regimen of 30 units in AM 15 units in the PM. I instructed him to skip insulin if BS is less than 150.  Again, I told him that he could even reduced his evening dose further to 10 units in case he gets hypoglycemic. Home glucose monitoring: Frequency:   Timing:   Instruction/counseling given: reminded to bring blood glucose meter & log to each visit Educational resources provided: brochure, handout Self management tools provided:   Other plans: Encouraged to check at least 3-4 times a day especially when he feels differently  Aim for a more liberal A1c goal of about 7.5% given hypoglycemia, advanced age and co-morbidities.  Follow up in 1-2 months with PCP

## 2014-09-05 NOTE — Patient Instructions (Signed)
General Instructions: Please continue with insulin 30 units in the morning when blood sugar is higher than 150 Please continue with insulin to 15 units. May cut it down to 10 units if you experience another episode of low blood sugar at night  Please come back in 1-2 months  Thank you for bringing your medicines today. This helps [place] keep you safe from mistakes.   Progress Toward Treatment Goals:  Treatment Goal 08/02/2014  Hemoglobin A1C at goal  Blood pressure deteriorated  Prevent falls unchanged    Self Care Goals & Plans:  Self Care Goal 09/05/2014  Manage my medications take my medicines as prescribed; bring my medications to every visit; refill my medications on time; follow the sick day instructions if I am sick  Monitor my health keep track of my blood glucose; check my feet daily  Eat healthy foods eat more vegetables; eat fruit for snacks and desserts; eat baked foods instead of fried foods; eat foods that are low in salt; eat smaller portions; drink diet soda or water instead of juice or soda  Be physically active find an activity I enjoy  Other -    Home Blood Glucose Monitoring 08/02/2014  Check my blood sugar 2 times a day  When to check my blood sugar before breakfast; at bedtime     Care Management & Community Referrals:  Referral 08/02/2014  Referrals made for care management support none needed  Referrals made to community resources none

## 2014-09-05 NOTE — Progress Notes (Signed)
Patient ID: [name] [name], male   DOB: May 25, 1945, 69 y.o.   MRN: [id number]   Subjective:   HPI: [name] is a 69 y.o. gentleman with past medical history below presents for follow up for recent hypoglycemia. I saw him 2 weeks and adjusted his Insulin regimen. Please see my assessment and plan for details.  No other complaints today.    Past Medical History  Diagnosis Date  . Hyperlipidemia LDL goal < 100 04/02/2006  . Essential hypertension 12/09/2006  . Benign prostatic hypertrophy with nocturia 07/07/2006  . Chronic venous insufficiency 04/12/2010  . Obesity (BMI 30.0-34.9) 01/15/2012  . Constipation 05/25/2009    Intermittent   . Microcytic normochromic anemia 05/27/2006  . Seasonal allergic rhinitis 05/25/2009  . Internal and external hemorrhoids without complication 08/20/2012  . Coronary artery disease 04/02/2006    s/p RCA DES 2006, low risk myoview in 2011  . 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 Type 2 diabetes mellitus with stage 3 chronic kidney disease   . Closed displaced fracture of left femoral neck 06/14/2014    s/p left hip hemiarthroplasty June 14, 2014   . Atrial flutter 07/26/2014    May 2016, CHA2DS2VASc = 4 -> Eliquis, spontaneous conversion to NSR   . Degenerative joint disease involving multiple joints 02/02/2007  . Gastroesophageal reflux disease     ROS: Constitutional:  Denies fevers, chills, diaphoresis, appetite change and fatigue.  Respiratory: Denies SOB, DOE, cough, chest tightness, and wheezing.  CVS: No chest pain,  palpitations and leg swelling.  GI: No abdominal pain, nausea, vomiting, bloody stools GU: No dysuria, frequency, hematuria, or flank pain.  MSK: No myalgias, back pain, joint swelling, arthralgias  Psych: No depression symptoms. No SI or SA.    Objective:  Physical Exam: Filed Vitals:   09/05/14 1415 09/05/14 1450  BP: 157/128 152/70  Pulse: 67 66  Temp: 98.3 F (36.8 C)   TempSrc: Oral   Height: 5' 8.5" (1.74 m)   Weight: 199 lb 3.2 oz (90.357 kg)   SpO2: 100%    General: Well nourished. No acute distress.  HEENT: Normal oral mucosa. MMM.  Lungs: CTA bilaterally. No wheezing. Heart: RRR; no extra sounds or murmurs  Abdomen: Non-distended, normal bowel sounds, soft, nontender; no hepatosplenomegaly  Extremities: s/p rt leg amputation. Prosthetic in place. Mild edema of right foot.  No joint swelling or tenderness. Neurologic: Normal EOM,  Alert and oriented x3. No obvious neurologic/cranial nerve deficits.  Assessment & Plan:  Discussed case with Dr Rogelia Boga See problem based charting for assessment and plan.

## 2014-10-10 NOTE — Progress Notes (Signed)
HPI: FU coronary disease; status post PCI of his right coronary artery with drug- eluting stent in February 2006.An abdominal ultrasound in August 2006 showed no aneurysm. Carotid Dopplers in August of 2006 showed probable 0-39% stenosis. His most recent Myoview in April 2015 showed EF 61 and normal perfusion. Larey Seat and had hip fx 3/16 requiring surgical repair. Then admitted with anemia requiring transfusion. Readmitted 5/16 and found to have atrial flutter. Echo 5/16 showed EF 50 with basal inferior akinesis, mild LAE, mild RAE/RVE. Placed on amiodarone and converted. Since last seen, he denies dyspnea, chest pain, palpitations or syncope.  Current Outpatient Prescriptions  Medication Sig Dispense Refill  . amiodarone (PACERONE) 200 MG tablet Take 1 tablet (200 mg total) by mouth daily. 90 tablet 3  . amLODipine (NORVASC) 10 MG tablet Take 10 mg by mouth daily.    [name] apixaban (ELIQUIS) 5 MG TABS tablet Take 1 tablet (5 mg total) by mouth 2 (two) times daily. 180 tablet 3  . calcium citrate-vitamin D (CITRACAL+D) 315-200 MG-UNIT per tablet Take 1 tablet by mouth daily.     . carvedilol (COREG) 12.5 MG tablet Take 1 tablet (12.5 mg total) by mouth 2 (two) times daily with a meal. 180 tablet 3  . docusate sodium (COLACE) 100 MG capsule Take 100 mg by mouth 2 (two) times daily.    . famotidine (PEPCID) 20 MG tablet Take 1 tablet (20 mg total) by mouth 2 (two) times daily. 60 tablet 0  . fish oil-omega-3 fatty acids 1000 MG capsule Take 1 g by mouth daily.    . fluticasone (FLONASE) 50 MCG/ACT nasal spray Place 2 sprays into both nostrils daily as needed for allergies. 16 g 11  . furosemide (LASIX) 40 MG tablet Take as needed for a weight gain of more than 5 lbs in a week. 90 tablet 3  . hydroxypropyl methylcellulose (ISOPTO TEARS) 2.5 % ophthalmic solution Place 2 drops into both eyes 4 (four) times daily as needed for dry eyes.    . insulin aspart protamine - aspart (NOVOLOG MIX 70/30 FLEXPEN)  (70-30) 100 UNIT/ML FlexPen Inject 0.3 mLs (30 Units total) into the skin as directed. Inject 30 units in the morning and 15 units in the evening 18 pen 3  . Insulin Pen Needle (NOVOFINE) 30G X 8 MM MISC Inject SQ as directed twice daily, use new pen needle for each injection 2 each 11  . Multiple Vitamins-Minerals (MULTIVITAMIN WITH MINERALS) tablet Take 1 tablet by mouth daily.    . sennosides-docusate sodium (SENOKOT-S) 8.6-50 MG tablet Take 2 tablets by mouth daily.     No current facility-administered medications for this visit.     Past Medical History  Diagnosis Date  . Hyperlipidemia LDL goal < 100 04/02/2006  . Essential hypertension 12/09/2006  . Benign prostatic hypertrophy with nocturia 07/07/2006  . Chronic venous insufficiency 04/12/2010  . Obesity (BMI 30.0-34.9) 01/15/2012  . Constipation 05/25/2009    Intermittent   . Microcytic normochromic anemia 05/27/2006  . Seasonal allergic rhinitis 05/25/2009  . Internal and external hemorrhoids without complication 08/20/2012  . Coronary artery disease 04/02/2006    s/p RCA DES 2006, low risk myoview in 2011  . 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 Type 2 diabetes mellitus  with peripheral artery disease 05/27/2006    Absent pulses in the left foot   . 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 Type 2 diabetes mellitus with stage 3 chronic kidney disease   . Closed displaced fracture of left femoral neck 06/14/2014    s/p left hip hemiarthroplasty June 14, 2014   . Atrial flutter 07/26/2014    May 2016, CHA2DS2VASc = 4 -> Eliquis, spontaneous conversion to NSR   . Degenerative joint disease involving multiple joints 02/02/2007  . Gastroesophageal reflux disease      Past Surgical History  Procedure Laterality Date  . Pilonidal cyst excision  1990's  . Refractive surgery Bilateral   . Fracture surgery Left     "hip/stump"  . Total hip arthroplasty Left 06/14/2014    Procedure: HEMI HIP ARTHROPLASTY ANTERIOR APPROACH;  Surgeon: Tarry KosNaiping M Xu, MD;  Location: [facility] OR;  Service: Orthopedics;  Laterality: Left;  . Joint replacement      Left Hip  . Leg amputation above knee Right ~ 2008  . Coronary angioplasty with stent placement      History   Social History  . Marital Status: Married    Spouse Name: N/A  . Number of Children: N/A  . Years of Education: N/A   Occupational History  . Not on file.   Social History Main Topics  . Smoking status: Former Smoker -- 1.00 packs/day for 10 years    Types: Cigarettes    Quit date: 06/16/1968  . Smokeless tobacco: Never Used  . Alcohol Use: Yes     Comment: "quit alcohol in the 1960's"  . Drug Use: No  . Sexual Activity: Yes    Birth Control/ Protection: None   Other Topics Concern  . Not on file   Social History Narrative    ROS: no fevers or chills, productive cough, hemoptysis, dysphasia, odynophagia, melena, hematochezia, dysuria, hematuria, rash, seizure activity, orthopnea, PND, pedal edema, claudication. Remaining systems are negative.  Physical Exam: Well-developed well-nourished in no acute distress.  Skin is warm and dry.  HEENT is normal.  Neck is supple.  Chest is clear to auscultation with normal expansion.  Cardiovascular exam is regular rate and rhythm.  Abdominal exam nontender or distended. No masses palpated. Extremities status post right AKA neuro grossly intact  ECG 07/28/2014-sinus rhythm, right bundle branch block.  Today's electrocardiogram shows sinus rhythm and right bundle branch block.

## 2014-10-13 ENCOUNTER — Encounter: Payer: Self-pay | Admitting: Cardiology

## 2014-10-13 ENCOUNTER — Ambulatory Visit (INDEPENDENT_AMBULATORY_CARE_PROVIDER_SITE_OTHER): Payer: Medicare PPO | Admitting: Cardiology

## 2014-10-13 VITALS — BP 140/60 | HR 62 | Ht 68.0 in | Wt 183.6 lb

## 2014-10-13 DIAGNOSIS — E785 Hyperlipidemia, unspecified: Secondary | ICD-10-CM

## 2014-10-13 DIAGNOSIS — I483 Typical atrial flutter: Secondary | ICD-10-CM | POA: Diagnosis not present

## 2014-10-13 DIAGNOSIS — I10 Essential (primary) hypertension: Secondary | ICD-10-CM

## 2014-10-13 MED ORDER — PRAVASTATIN SODIUM 40 MG PO TABS: 40.0000 mg | ORAL_TABLET | Freq: Every evening | ORAL | Status: DC

## 2014-10-13 NOTE — Assessment & Plan Note (Signed)
Patient remains in sinus rhythm. Continue amiodarone and apixaban; check TSH, liver functions, hemoglobin and renal function. I would like to avoid these medications long-term. I will ask electrophysiology to review for ablation. Note atrial flutter is clearly documented under cardiovascular strips on May 3.

## 2014-10-13 NOTE — Assessment & Plan Note (Signed)
Add Pravachol 40 mg daily. Check lipids and liver in 4 weeks. 

## 2014-10-13 NOTE — Assessment & Plan Note (Signed)
Add Pravachol. No aspirin given need for anticoagulation.

## 2014-10-13 NOTE — Assessment & Plan Note (Signed)
Continue present blood pressure medications. 

## 2014-10-13 NOTE — Patient Instructions (Signed)
Your physician recommends that you schedule a follow-up appointment in: 3 MONTHS WITH DR CRENSHAW  REFERRAL TO ELECTROPHYSIOLOGY TO DISCUSS ATRIAL FLUTTER ABLATION  START PRAVASTATIN 40 MG ONCE DAILY  Your physician recommends that you return for lab work in: 4 WEEKS = DO NOT EAT PRIOR TO LAB WORK

## 2014-10-16 ENCOUNTER — Encounter: Payer: Self-pay | Admitting: Internal Medicine

## 2014-10-17 ENCOUNTER — Telehealth: Payer: Self-pay | Admitting: Cardiology

## 2014-10-17 NOTE — Telephone Encounter (Signed)
New message      Calling to see if someone from this office called pt on last Friday?

## 2014-10-17 NOTE — Telephone Encounter (Signed)
Spoke to wife  she states someone had called her from Friday from the [facility] OFFICE. The phone number was main number there. RN informed wife ,the person did not leave message in the computer.  may have called to schedule appointment with "EP"  Wife states patient is available after this Friday 10/20/14.  Wife states please leave message on phone.  She handles appointment,do not leave message with patient,he will forget per wife.

## 2014-10-20 ENCOUNTER — Ambulatory Visit (INDEPENDENT_AMBULATORY_CARE_PROVIDER_SITE_OTHER): Payer: Medicare PPO | Admitting: Internal Medicine

## 2014-10-20 ENCOUNTER — Encounter: Payer: Self-pay | Admitting: Internal Medicine

## 2014-10-20 VITALS — BP 146/76 | HR 65 | Temp 97.9°F | Wt 190.8 lb

## 2014-10-20 DIAGNOSIS — N183 Chronic kidney disease, stage 3 unspecified: Secondary | ICD-10-CM

## 2014-10-20 DIAGNOSIS — X58XXXD Exposure to other specified factors, subsequent encounter: Secondary | ICD-10-CM

## 2014-10-20 DIAGNOSIS — I129 Hypertensive chronic kidney disease with stage 1 through stage 4 chronic kidney disease, or unspecified chronic kidney disease: Secondary | ICD-10-CM

## 2014-10-20 DIAGNOSIS — E1122 Type 2 diabetes mellitus with diabetic chronic kidney disease: Secondary | ICD-10-CM

## 2014-10-20 DIAGNOSIS — I4892 Unspecified atrial flutter: Secondary | ICD-10-CM

## 2014-10-20 DIAGNOSIS — S72002D Fracture of unspecified part of neck of left femur, subsequent encounter for closed fracture with routine healing: Secondary | ICD-10-CM

## 2014-10-20 DIAGNOSIS — E1159 Type 2 diabetes mellitus with other circulatory complications: Secondary | ICD-10-CM

## 2014-10-20 DIAGNOSIS — R351 Nocturia: Secondary | ICD-10-CM

## 2014-10-20 DIAGNOSIS — Z794 Long term (current) use of insulin: Secondary | ICD-10-CM | POA: Diagnosis not present

## 2014-10-20 DIAGNOSIS — N521 Erectile dysfunction due to diseases classified elsewhere: Secondary | ICD-10-CM

## 2014-10-20 DIAGNOSIS — I10 Essential (primary) hypertension: Secondary | ICD-10-CM

## 2014-10-20 DIAGNOSIS — N401 Enlarged prostate with lower urinary tract symptoms: Secondary | ICD-10-CM

## 2014-10-20 DIAGNOSIS — D631 Anemia in chronic kidney disease: Secondary | ICD-10-CM

## 2014-10-20 DIAGNOSIS — Z0000 Encounter for general adult medical examination without abnormal findings: Secondary | ICD-10-CM

## 2014-10-20 DIAGNOSIS — I483 Typical atrial flutter: Secondary | ICD-10-CM

## 2014-10-20 LAB — POCT GLYCOSYLATED HEMOGLOBIN (HGB A1C): Hemoglobin A1C: 5.6

## 2014-10-20 LAB — GLUCOSE, CAPILLARY: GLUCOSE-CAPILLARY: 148 mg/dL — AB (ref 65–99)

## 2014-10-20 MED ORDER — TERAZOSIN HCL 10 MG PO CAPS: 10.0000 mg | ORAL_CAPSULE | Freq: Every day | ORAL | Status: DC

## 2014-10-20 MED ORDER — LISINOPRIL 5 MG PO TABS: 2.5000 mg | ORAL_TABLET | Freq: Every day | ORAL | Status: DC

## 2014-10-20 NOTE — Patient Instructions (Signed)
It was good to see you.  You are doing too good of a job with your diabetes.  1) Keep taking your medications as you are.  2) Restart the terazosin 10 mg each night for your prostate.  3) Restart the lisinopril but at 2.5 mg daily for your kidneys.  4) Stop by the clinic next Thursday (10/26/2014) to get your labs drawn.  I will see you back in 3 months, sooner if necessary.

## 2014-10-20 NOTE — Assessment & Plan Note (Signed)
He has recovered remarkably well from his left femoral neck fracture. He denies any pain and is able to move the left leg without difficulty.

## 2014-10-20 NOTE — Progress Notes (Signed)
   Subjective:    Patient ID: [name] [name], male    DOB: March 10, 1946, 69 y.o.   MRN: [id number]  HPI  [name] [name] is here for follow-up of his diabetes complicated by stage III chronic kidney disease and HTN. Please see the A&P for the status of the pt's chronic medical problems.  Review of Systems  Constitutional: Positive for appetite change. Negative for activity change and unexpected weight change.  Respiratory: Negative for chest tightness, shortness of breath and wheezing.   Cardiovascular: Negative for chest pain, palpitations and leg swelling.  Gastrointestinal: Negative for abdominal pain.  Genitourinary: Positive for frequency.  Psychiatric/Behavioral: Negative for dysphoric mood. The patient is not nervous/anxious.       Objective:   Physical Exam  Constitutional: He is oriented to person, place, and time. He appears well-developed and well-nourished. No distress.  HENT:  Head: Normocephalic and atraumatic.  Eyes: Conjunctivae are normal. Right eye exhibits no discharge. Left eye exhibits no discharge. No scleral icterus.  Cardiovascular: Normal rate, regular rhythm and normal heart sounds.  Exam reveals no gallop and no friction rub.   No murmur heard. Pulmonary/Chest: Effort normal and breath sounds normal. No respiratory distress. He has no wheezes. He has no rales.  Abdominal: Soft. Bowel sounds are normal. He exhibits no distension. There is no tenderness. There is no rebound and no guarding.  Musculoskeletal: Normal range of motion. He exhibits no edema or tenderness.  s/p R BKA  Neurological: He is alert and oriented to person, place, and time. He exhibits normal muscle tone.  Skin: Skin is warm and dry. No rash noted. He is not diaphoretic. No erythema.  Psychiatric: He has a normal mood and affect. His behavior is normal. Judgment and thought content normal.  Nursing note and vitals reviewed.     Assessment & Plan:   Please see problem oriented  charting.

## 2014-10-20 NOTE — Assessment & Plan Note (Signed)
He brings in his logbook in and his morning fasting sugars are less than 150 but his pre-bedtime sugars are in the low 200s. He denies any further hypoglycemic episodes in which he became symptomatic since his dose was adjusted. He has not required any morning 70/30 insulin and takes between 5 and 10 units at night depending on his blood sugar level. His appetite has improved and he states he's put on some weight recently. Despite all of this his hemoglobin A1c is still 5.6, lower than I would like. Nonetheless, we will continue with the self adjusted dosing of his 70/30 insulin at night since he does not appear to be having any symptomatic lows and does not have the Symogi effect with elevated blood glucoses in the morning. He is up-to-date on his diabetic health maintenance. At the last visit he was noted to have significant albuminuria. As he is much more stable clinically we have decided to restart the ACE inhibitor. He was given a prescription for lisinopril 2.5 mg by mouth daily. We will recheck a basic metabolic panel in 1 week to assure he does not develop hyperkalemia or worsening renal insufficiency. We will reassess his diabetic control at the follow-up appointment with a repeat hemoglobin A1c.

## 2014-10-20 NOTE — Assessment & Plan Note (Signed)
His blood pressure today was slightly elevated at 146/76. This is on amlodipine 10 mg by mouth daily and carvedilol 12.5 mg by mouth twice daily. We are restarting the lisinopril at 2.5 mg by mouth daily and he will also start Terazosin 10 mg by mouth each night, these latter 2 antihypertensives are actually being used for other indications as noted. Nonetheless, he should receive benefit in terms of blood pressure control from these additions.

## 2014-10-20 NOTE — Assessment & Plan Note (Signed)
His Terazosin was previously stopped during a hospital admission when he had hypotension related to a GI bleed. At the last clinic visit he was not having symptomatic nocturia. He has since developed symptomatic nocturia and was wondering if he should restart the Terazosin. As his blood pressure is slightly elevated and there is no clinical indications of recurrent GI bleeding at this time I recommended that he restart the Terazosin at his previous dose of 10 mg by mouth each night. We will reassess his symptoms at the follow-up visit while on this therapy.

## 2014-10-20 NOTE — Assessment & Plan Note (Signed)
On examination he has a regular rhythm suggesting he is still in normal sinus rhythm. He was recently seen by cardiology and they referred him to electrophysiology to assess his candidacy for ablation. In the meantime, he continues on the amiodarone 200 mg by mouth daily, apixaban 5 mg by mouth twice daily, and carvedilol 12.5 mg by mouth twice daily should he flip into aflutter once again. We will reassess the status of his atrial flutter after he is assessed by his electrophysiologist. Ultimately, I believe cardiology is hopeful that he will not require lifelong amiodarone or anticoagulation if he can be successfully ablated.

## 2014-10-20 NOTE — Assessment & Plan Note (Signed)
At the follow-up visit we will reintroduce the issue of Zostavax and see if he is interested in obtaining it at that time. Otherwise he is up-to-date on his preventative health care maintenance.

## 2014-10-20 NOTE — Assessment & Plan Note (Signed)
His wife raises the issue of his erectile dysfunction and the penile pump. She was concerned it could result in damage to his prostate. I assured them that this was unlikely to be the case and that he had no contraindications to using the device and engaging in intercourse should they both be interested.

## 2014-10-20 NOTE — Assessment & Plan Note (Signed)
Since we are going to check a basic metabolic panel in 1 week we delayed obtaining the CBC and iron panel to follow-up on the anemia until then. Any necessary interventions will be based upon the direction in which the results of the labs may steer [place].

## 2014-10-26 ENCOUNTER — Other Ambulatory Visit (INDEPENDENT_AMBULATORY_CARE_PROVIDER_SITE_OTHER): Payer: Medicare PPO

## 2014-10-26 ENCOUNTER — Encounter: Payer: Self-pay | Admitting: *Deleted

## 2014-10-26 ENCOUNTER — Encounter: Payer: Self-pay | Admitting: Internal Medicine

## 2014-10-26 ENCOUNTER — Ambulatory Visit (INDEPENDENT_AMBULATORY_CARE_PROVIDER_SITE_OTHER): Payer: Medicare PPO | Admitting: Internal Medicine

## 2014-10-26 VITALS — BP 124/78 | HR 60 | Ht 68.0 in | Wt 193.0 lb

## 2014-10-26 DIAGNOSIS — I10 Essential (primary) hypertension: Secondary | ICD-10-CM

## 2014-10-26 DIAGNOSIS — N183 Chronic kidney disease, stage 3 (moderate): Secondary | ICD-10-CM

## 2014-10-26 DIAGNOSIS — E1122 Type 2 diabetes mellitus with diabetic chronic kidney disease: Secondary | ICD-10-CM | POA: Diagnosis not present

## 2014-10-26 DIAGNOSIS — I483 Typical atrial flutter: Secondary | ICD-10-CM

## 2014-10-26 NOTE — Progress Notes (Signed)
HPI [name] [name] is referred today by Dr. Jens Som for evaluation of atrial flutter. He is a pleasant 69 yo man with peripheral vascular disease, s/p AKA, HTN, DM, and atrial flutter. The patient was placed on systemic anti-coagulation and amiodarone and reverted back to NSR. He has not had syncope. He feels poorly in atrial flutter. He has tolerated both his anticoagulation and amiodarone. It was felt by Dr. Marsa Aris that her would be better off not taking amio or anti-coagulation if possible and he is referred for cathter ablation of atrial flutter. He denies frank syncope.  Allergies  Allergen Reactions  . Amoxicillin Swelling and Other (See Comments)    Puffy eyes and abdominal pain with Amoxicillin PO  . Pravastatin Other (See Comments)    Weakness and fatigue  . Tamsulosin Other (See Comments)    REACTION: dry throat, sweating, blurred vision  . Doxycycline Rash and Other (See Comments)    Questionable drug rxn rash     Current Outpatient Prescriptions  Medication Sig Dispense Refill  . amiodarone (PACERONE) 200 MG tablet Take 1 tablet (200 mg total) by mouth daily. 90 tablet 3  . amLODipine (NORVASC) 10 MG tablet Take 10 mg by mouth daily.    [name] apixaban (ELIQUIS) 5 MG TABS tablet Take 1 tablet (5 mg total) by mouth 2 (two) times daily. 180 tablet 3  . calcium citrate-vitamin D (CITRACAL+D) 315-200 MG-UNIT per tablet Take 1 tablet by mouth daily.     . carvedilol (COREG) 12.5 MG tablet Take 1 tablet (12.5 mg total) by mouth 2 (two) times daily with a meal. 180 tablet 3  . docusate sodium (COLACE) 100 MG capsule Take 100 mg by mouth 2 (two) times daily as needed (constipation).     . famotidine (PEPCID) 20 MG tablet Take 1 tablet (20 mg total) by mouth 2 (two) times daily. 60 tablet 0  . [name] oil-omega-3 fatty acids 1000 MG capsule Take 1 g by mouth daily.    . fluticasone (FLONASE) 50 MCG/ACT nasal spray Place 2 sprays into both nostrils daily as needed for allergies. 16 g 11  .  furosemide (LASIX) 40 MG tablet Take 1 tablet by mouth daily as needed for a weight gain of more than 5 lbs in a week.    . hydroxypropyl methylcellulose (ISOPTO TEARS) 2.5 % ophthalmic solution Place 2 drops into both eyes 4 (four) times daily as needed for dry eyes.    . insulin aspart protamine - aspart (NOVOLOG MIX 70/30 FLEXPEN) (70-30) 100 UNIT/ML FlexPen Inject 0.3 mLs (30 Units total) into the skin as directed. Inject 30 units in the morning and 15 units in the evening 18 pen 3  . Insulin Pen Needle (NOVOFINE) 30G X 8 MM MISC Inject SQ as directed twice daily, use new pen needle for each injection 2 each 11  . lisinopril (PRINIVIL,ZESTRIL) 5 MG tablet Take 0.5 tablets (2.5 mg total) by mouth daily. 45 tablet 3  . Multiple Vitamins-Minerals (MULTIVITAMIN WITH MINERALS) tablet Take 1 tablet by mouth daily.    . pravastatin (PRAVACHOL) 40 MG tablet Take 1 tablet (40 mg total) by mouth every evening. 90 tablet 3  . sennosides-docusate sodium (SENOKOT-S) 8.6-50 MG tablet Take 2 tablets by mouth daily as needed for constipation.     [name] terazosin (HYTRIN) 10 MG capsule Take 1 capsule (10 mg total) by mouth at bedtime. 90 capsule 3   No current facility-administered medications for this visit.     Past  Medical History  Diagnosis Date  . Hyperlipidemia LDL goal < 100 04/02/2006  . Essential hypertension 12/09/2006  . Benign prostatic hypertrophy with nocturia 07/07/2006  . Chronic venous insufficiency 04/12/2010  . Obesity (BMI 30.0-34.9) 01/15/2012  . Constipation 05/25/2009    Intermittent   . Microcytic normochromic anemia 05/27/2006  . Seasonal allergic rhinitis 05/25/2009  . Internal and external hemorrhoids without complication 08/20/2012  . Coronary artery disease 04/02/2006    s/p RCA DES 2006, low risk myoview in 2011  . 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 Type 2 diabetes mellitus with stage 3 chronic kidney disease   . Closed displaced fracture of left femoral neck 06/14/2014    s/p left hip hemiarthroplasty June 14, 2014   . Atrial flutter 07/26/2014    May 2016, CHA2DS2VASc = 4 -> Eliquis, spontaneous conversion to NSR   . Degenerative joint disease involving multiple joints 02/02/2007  . Gastroesophageal reflux disease     ROS:   All systems reviewed and negative except as noted in the HPI.   Past Surgical History  Procedure Laterality Date  . Pilonidal cyst excision  1990's  . Refractive surgery Bilateral   . Fracture surgery Left     "hip/stump"  . Total hip arthroplasty Left 06/14/2014    Procedure: HEMI HIP ARTHROPLASTY ANTERIOR APPROACH;  Surgeon: Tarry Kos, MD;  Location: [facility] OR;  Service: Orthopedics;  Laterality: Left;  . Joint replacement      Left Hip  . Leg amputation above knee Right ~ 2008  . Coronary angioplasty with stent placement       Family History  Problem Relation Age of Onset  . Hypertension Mother   . Heart attack Father   . Breast cancer Sister   . Arthritis Sister   . Heart attack Brother   . Diabetes Brother   . Stroke Brother   . Pneumonia Daughter   . Diabetes Brother   . Alcoholism Brother   . Diabetes Brother   . Arthritis Sister     Bilateral knee replacement  . HIV Daughter   . Hypertension Daughter   . Drug abuse Daughter   . Schizophrenia Daughter   . Hypertension Daughter   . Drug abuse Daughter   . Bipolar disorder Daughter      History   Social History  . Marital Status: Married    Spouse Name: N/A  . Number of Children: N/A  . Years of Education: N/A   Occupational History  . Not  on file.   Social History Main Topics  . Smoking status: Former Smoker -- 1.00 packs/day for 10 years    Types: Cigarettes    Quit date: 06/16/1968  . Smokeless tobacco: Never Used  . Alcohol Use: Yes     Comment: "quit alcohol in the 1960's"  . Drug Use: No  . Sexual Activity: Yes    Birth Control/ Protection: None   Other Topics Concern  . Not on file   Social History Narrative     BP 124/78 mmHg  Pulse 60  Ht  5\' 8"  (1.727 m)  Wt 193 lb (87.544 kg)  BMI 29.35 kg/m2  Physical Exam:  Well appearing 69 yo man, NAD HEENT: Unremarkable Neck:  7 cm JVD, no thyromegally Back:  No CVA tenderness Lungs:  Clear with no wheezes HEART:  Regular rate rhythm, no murmurs, no rubs, no clicks Abd:  soft, positive bowel sounds, no organomegally, no rebound, no guarding Ext:  2 plus pulses, no edema, no cyanosis, no clubbing, right AKA. Skin:  No rashes no nodules Neuro:  CN II through XII intact, motor grossly intact  EKG  - nsr with RBBB, left axis, LVH  DEVICE  Normal device function.  See PaceArt for details.   Assess/Plan:

## 2014-10-26 NOTE — Patient Instructions (Addendum)
Medication Instructions:  Your physician recommends that you continue on your current medications as directed. Please refer to the Current Medication list given to you today.   Labwork: Your physician recommends that you return for lab work on 12/04/14 at 10:00am   Testing/Procedures: Your physician has recommended that you have an ablation. Catheter ablation is a medical procedure used to treat some cardiac arrhythmias (irregular heartbeats). During catheter ablation, a long, thin, flexible tube is put into a blood vessel in your groin (upper thigh), or neck. This tube is called an ablation catheter. It is then guided to your heart through the blood vessel. Radio frequency waves destroy small areas of heart tissue where abnormal heartbeats may cause an arrhythmia to start. Please see the instruction sheet given to you today.    Follow-Up: Your physician recommends that you schedule a follow-up appointment in: 4 weeks from 12/11/14 with Gypsy Balsam, NP    Any Other Special Instructions Will Be Listed Below (If Applicable).  Cardiac Ablation Cardiac ablation is a procedure to disable a small amount of heart tissue in very specific places. The heart has many electrical connections. Sometimes these connections are abnormal and can cause the heart to beat very fast or irregularly. By disabling some of the problem areas, heart rhythm can be improved or made normal. Ablation is done for people who:   Have Wolff-Parkinson-White syndrome.   Have other fast heart rhythms (tachycardia).   Have taken medicines for an abnormal heart rhythm (arrhythmia) that resulted in:   No success.   Side effects.   May have a high-risk heartbeat that could result in death.  LET [facility] CARE PROVIDER KNOW ABOUT:   Any allergies you have or any previous reactions you have had to X-ray dye, food (such as seafood), medicine, or tape.   All medicines you are taking, including vitamins, herbs, eye  drops, creams, and over-the-counter medicines.   Previous problems you or members of your family have had with the use of anesthetics.   Any blood disorders you have.   Previous surgeries or procedures (such as a kidney transplant) you have had.   Medical conditions you have (such as kidney failure).  RISKS AND COMPLICATIONS Generally, cardiac ablation is a safe procedure. However, problems can occur and include:   Increased risk of cancer. Depending on how long it takes to do the ablation, the dose of radiation can be high.  Bruising and bleeding where a thin, flexible tube (catheter) was inserted during the procedure.   Bleeding into the chest, especially into the sac that surrounds the heart (serious).  Need for a permanent pacemaker if the normal electrical system is damaged.   The procedure may not be fully effective, and this may not be recognized for months. Repeat ablation procedures are sometimes required. BEFORE THE PROCEDURE   Follow any instructions from your health care provider regarding eating and drinking before the procedure.   Take your medicines as directed at regular times with water, unless instructed otherwise by your health care provider. If you are taking diabetes medicine, including insulin, ask how you are to take it and if there are any special instructions you should follow. It is common to adjust insulin dosing the day of the ablation.  PROCEDURE  An ablation is usually performed in a catheterization laboratory with the guidance of fluoroscopy. Fluoroscopy is a type of X-ray that helps your health care provider see images of your heart during the procedure.   An ablation is  a minimally invasive procedure. This means a small cut (incision) is made in either your neck or groin. Your health care provider will decide where to make the incision based on your medical history and physical exam.  An IV tube will be started before the procedure begins.  You will be given an anesthetic or medicine to help you relax (sedative).  The skin on your neck or groin will be numbed. A needle will be inserted into a large vein in your neck or groin and catheters will be threaded to your heart.  A special dye that shows up on fluoroscopy pictures may be injected through the catheter. The dye helps your health care provider see the area of the heart that needs treatment.  The catheter has electrodes on the tip. When the area of heart tissue that is causing the arrhythmia is found, the catheter tip will send an electrical current to the area and "scar" the tissue. Three types of energy can be used to ablate the heart tissue:   Heat (radiofrequency energy).   Laser energy.   Extreme cold (cryoablation).   When the area of the heart has been ablated, the catheter will be taken out. Pressure will be held on the insertion site. This will help the insertion site clot and keep it from bleeding. A bandage will be placed on the insertion site.  AFTER THE PROCEDURE   After the procedure, you will be taken to a recovery area where your vital signs (blood pressure, heart rate, and breathing) will be monitored. The insertion site will also be monitored for bleeding.   You will need to lie still for 4-6 hours. This is to ensure you do not bleed from the catheter insertion site.  Document Released: 07/27/2008 Document Revised: 07/25/2013 Document Reviewed: 08/02/2012 [facility] Patient Information 2015 [place], [place]. This information is not intended to replace advice given to you by your health care provider. Make sure you discuss any questions you have with your health care provider.

## 2014-10-26 NOTE — Assessment & Plan Note (Signed)
I have discussed the treatment options with the patient and his wife. The risk/benefit/goals/expectations of catheter ablation of atrial flutter have been reviewed with the patient and his wife and he wishes to proceed.

## 2014-10-26 NOTE — Assessment & Plan Note (Signed)
His blood pressure is well controlled. No change in meds. He is encouraged to reduce his sodium intake. 

## 2014-10-27 LAB — CBC
HEMOGLOBIN: 9.1 g/dL — AB (ref 12.6–17.7)
Hematocrit: 28.5 % — ABNORMAL LOW (ref 37.5–51.0)
MCH: 23.6 pg — ABNORMAL LOW (ref 26.6–33.0)
MCHC: 31.9 g/dL (ref 31.5–35.7)
MCV: 74 fL — AB (ref 79–97)
PLATELETS: 141 10*3/uL — AB (ref 150–379)
RBC: 3.86 x10E6/uL — ABNORMAL LOW (ref 4.14–5.80)
RDW: 18.9 % — AB (ref 12.3–15.4)
WBC: 6 10*3/uL (ref 3.4–10.8)

## 2014-10-27 LAB — BASIC METABOLIC PANEL
BUN / CREAT RATIO: 20 (ref 10–22)
BUN: 50 mg/dL — ABNORMAL HIGH (ref 8–27)
CHLORIDE: 106 mmol/L (ref 97–108)
CO2: 17 mmol/L — ABNORMAL LOW (ref 18–29)
CREATININE: 2.46 mg/dL — AB (ref 0.76–1.27)
Calcium: 7.6 mg/dL — ABNORMAL LOW (ref 8.6–10.2)
GFR, EST AFRICAN AMERICAN: 29 mL/min/{1.73_m2} — AB (ref >59)
GFR, EST NON AFRICAN AMERICAN: 25 mL/min/{1.73_m2} — AB (ref >59)
GLUCOSE: 189 mg/dL — AB (ref 65–99)
POTASSIUM: 4.6 mmol/L (ref 3.5–5.2)
SODIUM: 139 mmol/L (ref 134–144)

## 2014-10-27 LAB — IRON AND TIBC
IRON SATURATION: 15 % (ref 15–55)
IRON: 38 ug/dL (ref 38–169)
Total Iron Binding Capacity: 246 ug/dL — ABNORMAL LOW (ref 250–450)
UIBC: 208 ug/dL (ref 111–343)

## 2014-11-13 NOTE — Progress Notes (Signed)
BMP: Na 139, K 4.6, Cl 106, HCO3 17, BUN 50, Cr 2.46, Glucose 189  Worsening in renal function since restarting ACEI.  I called [name] [name] and asked that he stop the lisinopril at this time.  We will repeat the BMP at the follow-up visit on 11/22/2014.  If improved, he simply may not be able to tolerate ACEI.  The cause of the slight anion gap is unclear, but may be related to the acute kidney injury on the ACEI.  CBC Hgb 9.1, Plt 141 TIBC 246 UIBC 208 Fe 38 Fe Sat 15%  Anemia of chronic kidney disease, no evidence of iron deficiency.  When I called with the results his wife noted occasional gross hematuria.  I doubt this is the cause of his anemia, which is actually improved.  That being said, we will bring him into the clinic to assess kidney function and a UA.  He was given an appointment at my next available slot as noted above.

## 2014-11-21 ENCOUNTER — Telehealth: Payer: Self-pay | Admitting: Internal Medicine

## 2014-11-21 NOTE — Telephone Encounter (Signed)
Call to patient to confirm appointment for 11/22/14 at 9;15 lmtcb

## 2014-11-22 ENCOUNTER — Ambulatory Visit (INDEPENDENT_AMBULATORY_CARE_PROVIDER_SITE_OTHER): Payer: Medicare PPO | Admitting: Internal Medicine

## 2014-11-22 ENCOUNTER — Encounter: Payer: Self-pay | Admitting: Internal Medicine

## 2014-11-22 VITALS — BP 142/81 | HR 66 | Temp 98.1°F | Wt 196.0 lb

## 2014-11-22 DIAGNOSIS — E1122 Type 2 diabetes mellitus with diabetic chronic kidney disease: Secondary | ICD-10-CM | POA: Diagnosis not present

## 2014-11-22 DIAGNOSIS — Z0000 Encounter for general adult medical examination without abnormal findings: Secondary | ICD-10-CM

## 2014-11-22 DIAGNOSIS — N401 Enlarged prostate with lower urinary tract symptoms: Secondary | ICD-10-CM

## 2014-11-22 DIAGNOSIS — Z794 Long term (current) use of insulin: Secondary | ICD-10-CM

## 2014-11-22 DIAGNOSIS — I483 Typical atrial flutter: Secondary | ICD-10-CM

## 2014-11-22 DIAGNOSIS — R351 Nocturia: Secondary | ICD-10-CM

## 2014-11-22 DIAGNOSIS — K5900 Constipation, unspecified: Secondary | ICD-10-CM

## 2014-11-22 DIAGNOSIS — I4892 Unspecified atrial flutter: Secondary | ICD-10-CM

## 2014-11-22 DIAGNOSIS — R310 Gross hematuria: Secondary | ICD-10-CM

## 2014-11-22 DIAGNOSIS — I872 Venous insufficiency (chronic) (peripheral): Secondary | ICD-10-CM

## 2014-11-22 DIAGNOSIS — N183 Chronic kidney disease, stage 3 unspecified: Secondary | ICD-10-CM

## 2014-11-22 DIAGNOSIS — Z79899 Other long term (current) drug therapy: Secondary | ICD-10-CM

## 2014-11-22 MED ORDER — INSULIN ASPART PROT & ASPART (70-30 MIX) 100 UNIT/ML PEN: 10.0000 [IU] | PEN_INJECTOR | Freq: Two times a day (BID) | SUBCUTANEOUS | Status: DC

## 2014-11-22 NOTE — Assessment & Plan Note (Signed)
A Hepatitis C antibody was drawn per recommendations. He also received the flu vaccination today. He is otherwise up-to-date on his preventative health care maintenance.

## 2014-11-22 NOTE — Patient Instructions (Signed)
It was good to see you again today.  1) I checked your kidney function after stopping the lisinopril to make sure it is better.  I will call you if it is not and we need to do more testing.  2) Call your Urologist Dr. Brunilda Payor to set up an appointment to evaluate you for the blood from your penis.  Also discuss the terazosin with him as I would be very surprised if he told you to stop it.  3) Follow-up with Cardiology as already scheduled for your ablation procedure.  4) We gave you the flu shot today.  5) Stop the Senakot and start the Colace for your constipation.  6) Keep taking your other medications as you are.  I will see you in 3 months, sooner if necessary.

## 2014-11-22 NOTE — Assessment & Plan Note (Signed)
He continues to have some lower extremity edema on the left. Despite this he does not feel the need to take his Lasix very often. We will continue to follow this symptomatically.

## 2014-11-22 NOTE — Assessment & Plan Note (Signed)
He continues to have constipation on the Senokot and believes this medication is not helpful at all. He prefers Colace, which he was on before. This was therefore restarted. We will reassess his constipation at the follow-up visit while taking the as needed Colace.

## 2014-11-22 NOTE — Assessment & Plan Note (Signed)
About one week ago he had an episode of gross hematuria. He attributed this to his allergies. I assured him this was not related to his allergies and discussed the importance of evaluating this more closely. He is scheduled to see Dr. Brunilda Payor within the next month and I encouraged him to make this appointment and mentioned to him the fact that he had an episode of gross hematuria as further evaluation, including likely cystoscopy, will be necessary.

## 2014-11-22 NOTE — Assessment & Plan Note (Signed)
His fasting blood sugars, which he checks twice a day, are consistently under 200. He is finding that on most days he does not require any 70/30 insulin in the morning and only 10 units at night. We will continue with this regimen. The main reason why he's here today is to recheck his kidney function off of the ACE inhibitor. On several occasions over the last 1-2 years we have attempted to put him on an ACE inhibitor given his underlying stage III chronic kidney disease and concomitant diabetes. Each time he has suffered an acute kidney injury with a increase in his creatinine above what would be expected with the introduction of ACE inhibitors. When I received the blood work after restarting the ACE inhibitor last his creatinine had jumped in his eGFR had dropped below 30. I called and asked him to stop his ACE inhibitor so that we could check his kidney function today. He denies any chest pain, decreased appetite, nausea, or significant edema. A BMP was obtained today and is pending at the time of this dictation. If his chronic kidney disease is worse despite stopping the ACE inhibitor we will consider further evaluation including a renal ultrasound. I have listed ACE inhibitors as an intolerance given his consistent elevation in creatinine with rechallenge. He is otherwise up-to-date on his diabetic health care maintenance.

## 2014-11-22 NOTE — Assessment & Plan Note (Signed)
At the last visit I had restarted his Terazosin 10 mg by mouth each night because of significant nocturia. This had been stopped while he was hospitalized for a GI bleed. He subsequently started taking the medication, but noted no improvement in his nocturia. He states that Dr. Brunilda Payor told him to stop the Terazosin, although his wife and I are unsure if this is true. That being said, he continues to have nocturia 5 or 6 per night off of the Terazosin. In addition, his blood pressure his slightly elevated today. He is due to see Dr. Brunilda Payor within the next month. I have encouraged him to discuss this issue with him as I would be surprised if he was told to stop the Terazosin. We will reassess the alpha blockade therapy for his symptomatic bladder of the obstruction at the follow-up visit.

## 2014-11-22 NOTE — Progress Notes (Signed)
   Subjective:    Patient ID: [name] [name], male    DOB: 1945-12-20, 69 y.o.   MRN: [id number]  HPI  LINDLEY HINEY is here for follow-up of his renal function after stopping the ACEI. Please see the A&P for the status of the pt's chronic medical problems.  Review of Systems  Constitutional: Negative for activity change, appetite change and unexpected weight change.  HENT: Positive for congestion, postnasal drip, rhinorrhea and sneezing.   Eyes: Positive for redness.  Respiratory: Negative for chest tightness and shortness of breath.   Cardiovascular: Positive for leg swelling. Negative for chest pain and palpitations.  Gastrointestinal: Positive for constipation and abdominal distention. Negative for nausea, vomiting, abdominal pain and diarrhea.  Genitourinary: Positive for hematuria. Negative for dysuria, flank pain, discharge, penile swelling, difficulty urinating and penile pain.  Musculoskeletal: Negative for myalgias and arthralgias.  Skin: Negative for rash.  Neurological: Negative for dizziness, weakness and light-headedness.      Objective:   Physical Exam  Constitutional: He is oriented to person, place, and time. He appears well-developed and well-nourished. No distress.  HENT:  Head: Normocephalic and atraumatic.  Eyes: Conjunctivae are normal. Right eye exhibits no discharge. Left eye exhibits no discharge. No scleral icterus.  Cardiovascular: Normal rate, regular rhythm and normal heart sounds.  Exam reveals no gallop and no friction rub.   No murmur heard. Pulmonary/Chest: Effort normal and breath sounds normal. No respiratory distress. He has no wheezes. He has no rales.  Abdominal: Soft. Bowel sounds are normal. He exhibits no distension. There is no tenderness. There is no rebound and no guarding.  Musculoskeletal: Normal range of motion. He exhibits edema. He exhibits no tenderness.  s/p R AKA  Neurological: He is alert and oriented to person, place, and  time. He exhibits normal muscle tone.  Skin: Skin is warm and dry. No rash noted. He is not diaphoretic. No erythema.  Psychiatric: He has a normal mood and affect. His behavior is normal. Judgment and thought content normal.  Nursing note and vitals reviewed.     Assessment & Plan:   Please see problem based charting.

## 2014-11-22 NOTE — Assessment & Plan Note (Signed)
He is scheduled to undergo an ablation of his a flutter focus on September 19. The goal is to preclude the need for amiodarone and anticoagulation in the future. We will reassess the success of this procedure in achieving ablation of his paroxysmal atrial flutter at the follow-up visit.

## 2014-11-23 LAB — HEPATITIS C ANTIBODY: Hep C Virus Ab: >11 s/co ratio — ABNORMAL HIGH (ref 0.0–0.9)

## 2014-11-23 LAB — BASIC METABOLIC PANEL
BUN / CREAT RATIO: 19 (ref 10–22)
BUN: 50 mg/dL — ABNORMAL HIGH (ref 8–27)
CO2: 17 mmol/L — ABNORMAL LOW (ref 18–29)
Calcium: 7.9 mg/dL — ABNORMAL LOW (ref 8.6–10.2)
Chloride: 108 mmol/L (ref 97–108)
Creatinine, Ser: 2.59 mg/dL — ABNORMAL HIGH (ref 0.76–1.27)
GFR calc Af Amer: 28 mL/min/{1.73_m2} — ABNORMAL LOW (ref >59)
GFR, EST NON AFRICAN AMERICAN: 24 mL/min/{1.73_m2} — AB (ref >59)
Glucose: 178 mg/dL — ABNORMAL HIGH (ref 65–99)
POTASSIUM: 4.2 mmol/L (ref 3.5–5.2)
SODIUM: 140 mmol/L (ref 134–144)

## 2014-11-24 NOTE — Progress Notes (Signed)
BMP: K 4.2, BUN 50, Cr 2.59, HCO3 17, AG 15, gluc 178, eGFR 28  Slight worsening of renal function despite stopping the ACEI.  I called the patient to inform him of the results.  He wants me to call and speak with his wife later (she is not home at this time and manages most of his medical care).  I will check a CBC with diff, urine eosinophils, and a renal ultrasound to assess for a possible etiology outside of progression of underlying diabetic nephropathy.  Hep C Ab Positive  Will check Hep C RNA and genotype, HBsAb, HBcAb, Hep A, INR, ANA, and iron.  If Hep C RNA is positive we will then order a complete abdominal ultrasound with elastography in anticipation of referral to the Hepatitis C clinic.  Will place orders for the above once I can coordinate timing with his wife early next week.

## 2014-11-28 ENCOUNTER — Other Ambulatory Visit: Payer: Self-pay | Admitting: *Deleted

## 2014-11-28 DIAGNOSIS — E1122 Type 2 diabetes mellitus with diabetic chronic kidney disease: Secondary | ICD-10-CM

## 2014-11-28 DIAGNOSIS — N183 Chronic kidney disease, stage 3 unspecified: Secondary | ICD-10-CM

## 2014-11-28 DIAGNOSIS — N401 Enlarged prostate with lower urinary tract symptoms: Secondary | ICD-10-CM

## 2014-11-28 DIAGNOSIS — R351 Nocturia: Principal | ICD-10-CM

## 2014-11-28 NOTE — Progress Notes (Unsigned)
New order for renal ultrasound as previous order had expired.  Request sent to front staff to insurance precert.[name] Spittle Cassady9/6/20164:57 PM

## 2014-11-30 ENCOUNTER — Encounter: Payer: Self-pay | Admitting: Internal Medicine

## 2014-11-30 DIAGNOSIS — I700 Atherosclerosis of aorta: Secondary | ICD-10-CM | POA: Insufficient documentation

## 2014-12-04 ENCOUNTER — Other Ambulatory Visit (INDEPENDENT_AMBULATORY_CARE_PROVIDER_SITE_OTHER): Payer: Medicare PPO | Admitting: *Deleted

## 2014-12-04 DIAGNOSIS — I483 Typical atrial flutter: Secondary | ICD-10-CM

## 2014-12-04 LAB — CBC WITH DIFFERENTIAL/PLATELET
BASOS ABS: 0 10*3/uL (ref 0.0–0.1)
Basophils Relative: 0.5 % (ref 0.0–3.0)
EOS ABS: 0.1 10*3/uL (ref 0.0–0.7)
Eosinophils Relative: 2.2 % (ref 0.0–5.0)
HEMATOCRIT: 27.3 % — AB (ref 39.0–52.0)
Hemoglobin: 8.7 g/dL — ABNORMAL LOW (ref 13.0–17.0)
LYMPHS ABS: 1.7 10*3/uL (ref 0.7–4.0)
LYMPHS PCT: 28.8 % (ref 12.0–46.0)
MCHC: 31.7 g/dL (ref 30.0–36.0)
MCV: 75.6 fl — ABNORMAL LOW (ref 78.0–100.0)
Monocytes Absolute: 0.5 10*3/uL (ref 0.1–1.0)
Monocytes Relative: 8.3 % (ref 3.0–12.0)
NEUTROS ABS: 3.5 10*3/uL (ref 1.4–7.7)
NEUTROS PCT: 60.2 % (ref 43.0–77.0)
PLATELETS: 157 10*3/uL (ref 150.0–400.0)
RBC: 3.62 Mil/uL — AB (ref 4.22–5.81)
RDW: 15.6 % — ABNORMAL HIGH (ref 11.5–15.5)
WBC: 5.8 10*3/uL (ref 4.0–10.5)

## 2014-12-04 LAB — BASIC METABOLIC PANEL
BUN: 54 mg/dL — ABNORMAL HIGH (ref 6–23)
CALCIUM: 7.8 mg/dL — AB (ref 8.4–10.5)
CO2: 21 meq/L (ref 19–32)
CREATININE: 3.3 mg/dL — AB (ref 0.40–1.50)
Chloride: 111 mEq/L (ref 96–112)
GFR: 23.84 mL/min — AB (ref >60.00)
Glucose, Bld: 128 mg/dL — ABNORMAL HIGH (ref 70–99)
Potassium: 4.1 mEq/L (ref 3.5–5.1)
SODIUM: 138 meq/L (ref 135–145)

## 2014-12-06 ENCOUNTER — Telehealth: Payer: Self-pay | Admitting: Internal Medicine

## 2014-12-06 NOTE — Telephone Encounter (Signed)
New message      Calling to let the nurse know she will bring a list of patient's medications to you tomorrow morning.  She will not be available to do it today or for you to call her today.

## 2014-12-06 NOTE — Telephone Encounter (Signed)
Informed patient's wife that is was the hospital calling for medication list.  Advised to take list to hospital when they go for patient's procedure. She verbalized understanding and agreeable to plan.

## 2014-12-08 ENCOUNTER — Telehealth: Payer: Self-pay | Admitting: *Deleted

## 2014-12-08 NOTE — Telephone Encounter (Signed)
Dr. Ladona Ridgel made aware. No changes/orders.

## 2014-12-08 NOTE — Telephone Encounter (Signed)
Basic metabolic panel  Status: Finalresult Visible to patient:  Not Released Nextappt: 12/20/2014 at 02:40 PM in Cardiology Gypsy Balsam K, NP) Dx:  Typical atrial flutter           Notes Recorded by Baird Lyons, RN on 12/04/2014 at 5:10 PM Reviewed with DOD, Dr. Excell Seltzer. Order to stop Lisinopril and review with Dr. Ladona Ridgel on Wednesday (when he is back in office). Spoke with patient's wife (ok per pt) who explains that Lisinopril has already been stopped. Explained that I would review this with Dr. Ladona Ridgel on Wednesday and call them recommendations, if any. She verbalized understanding and agreeable to plan.       Ref Range 4d ago    Sodium 135 - 145 mEq/L 138   Potassium 3.5 - 5.1 mEq/L 4.1   Chloride 96 - 112 mEq/L 111   CO2 19 - 32 mEq/L 21   Glucose, Bld 70 - 99 mg/dL 161 (H)   BUN 6 - 23 mg/dL 54 (H)   Creatinine, Ser 0.40 - 1.50 mg/dL 0.96 (H)   Calcium 8.4 - 10.5 mg/dL 7.8 (L)   GFR >04.54 mL/min 23.84 (L)   Resulting Agency  [facility]       Specimen Collected: 12/04/14 9:51 AM Last Resulted: 12/04/14 2:15 PM

## 2014-12-10 ENCOUNTER — Emergency Department (HOSPITAL_COMMUNITY): Payer: Medicare PPO

## 2014-12-10 ENCOUNTER — Inpatient Hospital Stay (HOSPITAL_COMMUNITY)
Admission: EM | Admit: 2014-12-10 | Discharge: 2014-12-13 | DRG: 418 | Disposition: A | Discharge status: Home / Self Care | Payer: Medicare PPO | Attending: Student in an Organized Health Care Education/Training Program | Admitting: Student in an Organized Health Care Education/Training Program

## 2014-12-10 ENCOUNTER — Other Ambulatory Visit: Payer: Self-pay

## 2014-12-10 ENCOUNTER — Encounter (HOSPITAL_COMMUNITY): Payer: Self-pay | Admitting: Emergency Medicine

## 2014-12-10 DIAGNOSIS — I2510 Atherosclerotic heart disease of native coronary artery without angina pectoris: Secondary | ICD-10-CM | POA: Diagnosis present

## 2014-12-10 DIAGNOSIS — Z89611 Acquired absence of right leg above knee: Secondary | ICD-10-CM

## 2014-12-10 DIAGNOSIS — I872 Venous insufficiency (chronic) (peripheral): Secondary | ICD-10-CM | POA: Diagnosis present

## 2014-12-10 DIAGNOSIS — D631 Anemia in chronic kidney disease: Secondary | ICD-10-CM | POA: Diagnosis present

## 2014-12-10 DIAGNOSIS — K5900 Constipation, unspecified: Secondary | ICD-10-CM | POA: Diagnosis present

## 2014-12-10 DIAGNOSIS — Z955 Presence of coronary angioplasty implant and graft: Secondary | ICD-10-CM

## 2014-12-10 DIAGNOSIS — Z96642 Presence of left artificial hip joint: Secondary | ICD-10-CM | POA: Diagnosis present

## 2014-12-10 DIAGNOSIS — R1011 Right upper quadrant pain: Secondary | ICD-10-CM

## 2014-12-10 DIAGNOSIS — Z79899 Other long term (current) drug therapy: Secondary | ICD-10-CM

## 2014-12-10 DIAGNOSIS — R109 Unspecified abdominal pain: Secondary | ICD-10-CM

## 2014-12-10 DIAGNOSIS — N390 Urinary tract infection, site not specified: Secondary | ICD-10-CM | POA: Diagnosis present

## 2014-12-10 DIAGNOSIS — E039 Hypothyroidism, unspecified: Secondary | ICD-10-CM | POA: Diagnosis present

## 2014-12-10 DIAGNOSIS — E1151 Type 2 diabetes mellitus with diabetic peripheral angiopathy without gangrene: Secondary | ICD-10-CM | POA: Diagnosis present

## 2014-12-10 DIAGNOSIS — I4891 Unspecified atrial fibrillation: Secondary | ICD-10-CM | POA: Diagnosis present

## 2014-12-10 DIAGNOSIS — K819 Cholecystitis, unspecified: Secondary | ICD-10-CM

## 2014-12-10 DIAGNOSIS — N183 Chronic kidney disease, stage 3 (moderate): Secondary | ICD-10-CM | POA: Diagnosis present

## 2014-12-10 DIAGNOSIS — R351 Nocturia: Secondary | ICD-10-CM | POA: Diagnosis present

## 2014-12-10 DIAGNOSIS — I700 Atherosclerosis of aorta: Secondary | ICD-10-CM | POA: Diagnosis present

## 2014-12-10 DIAGNOSIS — E11349 Type 2 diabetes mellitus with severe nonproliferative diabetic retinopathy without macular edema: Secondary | ICD-10-CM | POA: Diagnosis present

## 2014-12-10 DIAGNOSIS — B182 Chronic viral hepatitis C: Secondary | ICD-10-CM | POA: Diagnosis present

## 2014-12-10 DIAGNOSIS — K8012 Calculus of gallbladder with acute and chronic cholecystitis without obstruction: Principal | ICD-10-CM | POA: Diagnosis present

## 2014-12-10 DIAGNOSIS — K219 Gastro-esophageal reflux disease without esophagitis: Secondary | ICD-10-CM | POA: Diagnosis present

## 2014-12-10 DIAGNOSIS — Z8249 Family history of ischemic heart disease and other diseases of the circulatory system: Secondary | ICD-10-CM | POA: Diagnosis not present

## 2014-12-10 DIAGNOSIS — Z6829 Body mass index (BMI) 29.0-29.9, adult: Secondary | ICD-10-CM

## 2014-12-10 DIAGNOSIS — I10 Essential (primary) hypertension: Secondary | ICD-10-CM | POA: Diagnosis present

## 2014-12-10 DIAGNOSIS — Z881 Allergy status to other antibiotic agents status: Secondary | ICD-10-CM

## 2014-12-10 DIAGNOSIS — Z888 Allergy status to other drugs, medicaments and biological substances status: Secondary | ICD-10-CM

## 2014-12-10 DIAGNOSIS — E1140 Type 2 diabetes mellitus with diabetic neuropathy, unspecified: Secondary | ICD-10-CM | POA: Diagnosis present

## 2014-12-10 DIAGNOSIS — K8020 Calculus of gallbladder without cholecystitis without obstruction: Secondary | ICD-10-CM | POA: Diagnosis present

## 2014-12-10 DIAGNOSIS — Z794 Long term (current) use of insulin: Secondary | ICD-10-CM | POA: Diagnosis not present

## 2014-12-10 DIAGNOSIS — K8001 Calculus of gallbladder with acute cholecystitis with obstruction: Secondary | ICD-10-CM

## 2014-12-10 DIAGNOSIS — Z833 Family history of diabetes mellitus: Secondary | ICD-10-CM

## 2014-12-10 DIAGNOSIS — E669 Obesity, unspecified: Secondary | ICD-10-CM | POA: Diagnosis present

## 2014-12-10 DIAGNOSIS — K8000 Calculus of gallbladder with acute cholecystitis without obstruction: Secondary | ICD-10-CM | POA: Diagnosis not present

## 2014-12-10 DIAGNOSIS — Z01810 Encounter for preprocedural cardiovascular examination: Secondary | ICD-10-CM | POA: Diagnosis not present

## 2014-12-10 DIAGNOSIS — E785 Hyperlipidemia, unspecified: Secondary | ICD-10-CM | POA: Diagnosis present

## 2014-12-10 DIAGNOSIS — N401 Enlarged prostate with lower urinary tract symptoms: Secondary | ICD-10-CM | POA: Diagnosis present

## 2014-12-10 DIAGNOSIS — E1122 Type 2 diabetes mellitus with diabetic chronic kidney disease: Secondary | ICD-10-CM | POA: Diagnosis present

## 2014-12-10 DIAGNOSIS — B1920 Unspecified viral hepatitis C without hepatic coma: Secondary | ICD-10-CM

## 2014-12-10 DIAGNOSIS — R188 Other ascites: Secondary | ICD-10-CM | POA: Diagnosis present

## 2014-12-10 DIAGNOSIS — I129 Hypertensive chronic kidney disease with stage 1 through stage 4 chronic kidney disease, or unspecified chronic kidney disease: Secondary | ICD-10-CM | POA: Diagnosis present

## 2014-12-10 DIAGNOSIS — I4892 Unspecified atrial flutter: Secondary | ICD-10-CM | POA: Diagnosis present

## 2014-12-10 DIAGNOSIS — Z7901 Long term (current) use of anticoagulants: Secondary | ICD-10-CM

## 2014-12-10 DIAGNOSIS — Z9049 Acquired absence of other specified parts of digestive tract: Secondary | ICD-10-CM | POA: Diagnosis not present

## 2014-12-10 LAB — GLUCOSE, CAPILLARY
GLUCOSE-CAPILLARY: 146 mg/dL — AB (ref 65–99)
GLUCOSE-CAPILLARY: 96 mg/dL (ref 65–99)

## 2014-12-10 LAB — CBC WITH DIFFERENTIAL/PLATELET
BASOS PCT: 0 %
Basophils Absolute: 0 10*3/uL (ref 0.0–0.1)
EOS ABS: 0.1 10*3/uL (ref 0.0–0.7)
EOS PCT: 1 %
HCT: 28.7 % — ABNORMAL LOW (ref 39.0–52.0)
HEMOGLOBIN: 9.2 g/dL — AB (ref 13.0–17.0)
LYMPHS ABS: 1.2 10*3/uL (ref 0.7–4.0)
LYMPHS PCT: 21 %
MCH: 23.2 pg — ABNORMAL LOW (ref 26.0–34.0)
MCHC: 32.1 g/dL (ref 30.0–36.0)
MCV: 72.3 fL — ABNORMAL LOW (ref 78.0–100.0)
Monocytes Absolute: 0.3 10*3/uL (ref 0.1–1.0)
Monocytes Relative: 5 %
NEUTROS ABS: 4.2 10*3/uL (ref 1.7–7.7)
Neutrophils Relative %: 73 %
PLATELETS: 166 10*3/uL (ref 150–400)
RBC: 3.97 MIL/uL — AB (ref 4.22–5.81)
RDW: 15.4 % (ref 11.5–15.5)
WBC: 5.8 10*3/uL (ref 4.0–10.5)

## 2014-12-10 LAB — COMPREHENSIVE METABOLIC PANEL
ALBUMIN: 2.6 g/dL — AB (ref 3.5–5.0)
ALK PHOS: 66 U/L (ref 38–126)
ALT: 24 U/L (ref 17–63)
ANION GAP: 6 (ref 5–15)
AST: 59 U/L — ABNORMAL HIGH (ref 15–41)
BUN: 54 mg/dL — ABNORMAL HIGH (ref 6–20)
CALCIUM: 7.9 mg/dL — AB (ref 8.9–10.3)
CHLORIDE: 112 mmol/L — AB (ref 101–111)
CO2: 19 mmol/L — AB (ref 22–32)
Creatinine, Ser: 3.31 mg/dL — ABNORMAL HIGH (ref 0.61–1.24)
GFR calc non Af Amer: 17 mL/min — ABNORMAL LOW (ref >60)
GFR, EST AFRICAN AMERICAN: 20 mL/min — AB (ref >60)
GLUCOSE: 158 mg/dL — AB (ref 65–99)
POTASSIUM: 5.5 mmol/L — AB (ref 3.5–5.1)
SODIUM: 137 mmol/L (ref 135–145)
Total Bilirubin: 0.5 mg/dL (ref 0.3–1.2)
Total Protein: 6.9 g/dL (ref 6.5–8.1)

## 2014-12-10 LAB — URINALYSIS, ROUTINE W REFLEX MICROSCOPIC
Bilirubin Urine: NEGATIVE
Glucose, UA: NEGATIVE mg/dL
KETONES UR: NEGATIVE mg/dL
NITRITE: NEGATIVE
Specific Gravity, Urine: 1.012 (ref 1.005–1.030)
UROBILINOGEN UA: 0.2 mg/dL (ref 0.0–1.0)
pH: 5.5 (ref 5.0–8.0)

## 2014-12-10 LAB — LACTIC ACID, PLASMA: Lactic Acid, Venous: 0.6 mmol/L (ref 0.5–2.0)

## 2014-12-10 LAB — URINE MICROSCOPIC-ADD ON

## 2014-12-10 LAB — LIPASE, BLOOD: LIPASE: 18 U/L — AB (ref 22–51)

## 2014-12-10 MED ORDER — FLUTICASONE PROPIONATE 50 MCG/ACT NA SUSP: 2.0000 | Freq: Every day | NASAL | Status: DC | PRN

## 2014-12-10 MED ORDER — TERAZOSIN HCL 5 MG PO CAPS: 10.0000 mg | ORAL_CAPSULE | Freq: Every day | ORAL | Status: DC

## 2014-12-10 MED ORDER — HEPARIN SODIUM (PORCINE) 5000 UNIT/ML IJ SOLN
5000.0000 [IU] | Freq: Three times a day (TID) | INTRAMUSCULAR | Status: DC
  Administered 2014-12-10 – 2014-12-11 (×3): 5000 [IU] via SUBCUTANEOUS
  Filled 2014-12-10 (×3): qty 1

## 2014-12-10 MED ORDER — PIPERACILLIN-TAZOBACTAM IN DEX 2-0.25 GM/50ML IV SOLN
2.2500 g | Freq: Four times a day (QID) | INTRAVENOUS | Status: DC
  Filled 2014-12-10 (×3): qty 50

## 2014-12-10 MED ORDER — SENNOSIDES-DOCUSATE SODIUM 8.6-50 MG PO TABS
1.0000 | ORAL_TABLET | Freq: Every day | ORAL | Status: DC
  Administered 2014-12-10: 1 via ORAL
  Filled 2014-12-10: qty 1

## 2014-12-10 MED ORDER — ONDANSETRON HCL 4 MG/2ML IJ SOLN: 4.0000 mg | Freq: Once | INTRAMUSCULAR | Status: DC

## 2014-12-10 MED ORDER — INSULIN ASPART 100 UNIT/ML ~~LOC~~ SOLN
0.0000 [IU] | Freq: Three times a day (TID) | SUBCUTANEOUS | Status: DC
  Administered 2014-12-10 – 2014-12-12 (×3): 2 [IU] via SUBCUTANEOUS

## 2014-12-10 MED ORDER — SODIUM CHLORIDE 0.9 % IV SOLN
1000.0000 mL | Freq: Once | INTRAVENOUS | Status: AC
Start: 2014-12-10 — End: 2014-12-10
  Administered 2014-12-10: 1000 mL via INTRAVENOUS

## 2014-12-10 MED ORDER — PRAVASTATIN SODIUM 40 MG PO TABS
40.0000 mg | ORAL_TABLET | Freq: Every evening | ORAL | Status: DC
  Filled 2014-12-10: qty 1

## 2014-12-10 MED ORDER — CEFTRIAXONE SODIUM 2 G IJ SOLR
2.0000 g | Freq: Once | INTRAMUSCULAR | Status: AC
  Administered 2014-12-10: 2 g via INTRAVENOUS
  Filled 2014-12-10: qty 2

## 2014-12-10 MED ORDER — INSULIN ASPART PROT & ASPART (70-30 MIX) 100 UNIT/ML PEN: 5.0000 [IU] | PEN_INJECTOR | Freq: Two times a day (BID) | SUBCUTANEOUS | Status: DC

## 2014-12-10 MED ORDER — PROMETHAZINE HCL 25 MG/ML IJ SOLN
12.5000 mg | Freq: Four times a day (QID) | INTRAMUSCULAR | Status: DC | PRN
  Administered 2014-12-11: 12.5 mg via INTRAVENOUS
  Filled 2014-12-10 (×3): qty 1

## 2014-12-10 MED ORDER — AMIODARONE HCL 200 MG PO TABS
200.0000 mg | ORAL_TABLET | Freq: Every day | ORAL | Status: DC
  Administered 2014-12-10 – 2014-12-13 (×4): 200 mg via ORAL
  Filled 2014-12-10 (×4): qty 1

## 2014-12-10 MED ORDER — PNEUMOCOCCAL VAC POLYVALENT 25 MCG/0.5ML IJ INJ: 0.5000 mL | INJECTION | INTRAMUSCULAR | Status: DC

## 2014-12-10 MED ORDER — MORPHINE SULFATE (PF) 4 MG/ML IV SOLN: 4.0000 mg | Freq: Once | INTRAVENOUS | Status: DC

## 2014-12-10 MED ORDER — CARVEDILOL 12.5 MG PO TABS
12.5000 mg | ORAL_TABLET | Freq: Two times a day (BID) | ORAL | Status: DC
  Administered 2014-12-11 – 2014-12-13 (×6): 12.5 mg via ORAL
  Filled 2014-12-10 (×6): qty 1

## 2014-12-10 MED ORDER — DEXTROSE 5 % IV SOLN
2.0000 g | INTRAVENOUS | Status: DC
Start: 2014-12-11 — End: 2014-12-12
  Administered 2014-12-11: 2 g via INTRAVENOUS
  Filled 2014-12-10 (×2): qty 2

## 2014-12-10 MED ORDER — SODIUM CHLORIDE 0.9 % IV SOLN
INTRAVENOUS | Status: AC
  Administered 2014-12-10 – 2014-12-11 (×2): via INTRAVENOUS

## 2014-12-10 MED ORDER — HYPROMELLOSE (GONIOSCOPIC) 2.5 % OP SOLN: 2.0000 [drp] | Freq: Four times a day (QID) | OPHTHALMIC | Status: DC | PRN

## 2014-12-10 MED ORDER — PIPERACILLIN-TAZOBACTAM 3.375 G IVPB 30 MIN
3.3750 g | Freq: Once | INTRAVENOUS | Status: DC
  Filled 2014-12-10: qty 50

## 2014-12-10 NOTE — Consult Note (Signed)
Reason for Consult: Cholecystitis with cholelithiasis Referring Physician: Chanetta Marshall, NP  [name] [name] is an 69 y.o. male.  HPI: [name] [name] is followed by Dr. Terressa [name] with the teaching service. He takes Eliquis for a history of atrial flutter and was actually scheduled to undergo a procedure for that tomorrow by Dr. Crissie [name]. His usual cardiologist is Dr. Kirk [name]. He presented to the emergency department with 48 hour history of nausea and vomiting. This especially comes on after eating and he has not been able to eat much. He was evaluated in the emergency department with CT and right upper quadrant ultrasound. He was found to have cholelithiasis with gallbladder wall thickening concerning for cholecystitis and I was asked to see him in consultation. He has had no abdominal pain.  Past Medical History  Diagnosis Date  . Hyperlipidemia LDL goal < 100 04/02/2006  . Essential hypertension 12/09/2006  . Benign prostatic hypertrophy with nocturia 07/07/2006  . Chronic venous insufficiency 04/12/2010  . Obesity (BMI 30.0-34.9) 01/15/2012  . Constipation 05/25/2009    Intermittent   . Microcytic normochromic anemia 05/27/2006  . Seasonal allergic rhinitis 05/25/2009  . Internal and external hemorrhoids without complication 2/95/2841  . Coronary artery disease 04/02/2006    s/p RCA DES 2006, low risk myoview in 2011  . 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 Type 2 diabetes mellitus with stage 3 chronic kidney disease   . Closed displaced fracture of left femoral neck 06/14/2014     s/p left hip hemiarthroplasty June 14, 2014   . Atrial flutter 07/26/2014    May 2016, CHA2DS2VASc = 4 -> Eliquis, spontaneous conversion to NSR   . Degenerative joint disease involving multiple joints 02/02/2007  . Gastroesophageal reflux disease   . Aortic atherosclerosis 11/30/2014    Seen on CT, currently asymptomatic    Past Surgical History  Procedure Laterality Date  . Pilonidal cyst excision  1990's  . Refractive surgery Bilateral   . Fracture surgery Left     "hip/stump"  . Total hip arthroplasty Left 06/14/2014    Procedure: HEMI HIP ARTHROPLASTY ANTERIOR APPROACH;  Surgeon: [name] Koyanagi, MD;  Location: [facility];  Service: Orthopedics;  Laterality: Left;  . Joint replacement      Left Hip  . Leg amputation above knee Right ~ 2008  . Coronary angioplasty with stent placement      Family History  Problem Relation Age of Onset  . Hypertension Mother   . Heart attack Father   . Breast cancer Sister   . Arthritis Sister   . Heart attack Brother   . Diabetes Brother   . Stroke Brother   . Pneumonia Daughter   . Diabetes Brother   . Alcoholism Brother   . Diabetes Brother   . Arthritis Sister     Bilateral knee replacement  . HIV Daughter   . Hypertension Daughter   . Drug abuse Daughter   . Schizophrenia Daughter   . Hypertension Daughter   . Drug abuse Daughter   . Bipolar disorder Daughter  Social History:  reports that he quit smoking about 46 years ago. His smoking use included Cigarettes. He has a 10 pack-year smoking history. He has never used smokeless tobacco. He reports that he drinks alcohol. He reports that he does not use illicit drugs.  Allergies:  Allergies  Allergen Reactions  . Amoxicillin Swelling and Other (See Comments)    Puffy eyes and abdominal pain with Amoxicillin PO  . Lisinopril Other (See Comments)    Acute kidney injury  . Pravastatin Other (See Comments)    Weakness and fatigue  . Tamsulosin Other (See Comments)    REACTION:  dry throat, sweating, blurred vision  . Doxycycline Rash and Other (See Comments)    Questionable drug rxn rash    Medications:  Scheduled: . amiodarone  200 mg Oral Daily  . [START ON 12/11/2014] carvedilol  12.5 mg Oral BID WC  . heparin  5,000 Units Subcutaneous 3 times per day  . insulin aspart  0-15 Units Subcutaneous TID WC  . pravastatin  40 mg Oral QPM  . senna-docusate  1 tablet Oral QHS   Continuous: . sodium chloride    . piperacillin-tazobactam     KGU:[id number], hydroxypropyl methylcellulose / hypromellose, promethazine  Results for orders placed or performed during the hospital encounter of 12/10/14 (from the past 48 hour(s))  CBC WITH DIFFERENTIAL     Status: Abnormal   Collection Time: 12/10/14 10:52 AM  Result Value Ref Range   WBC 5.8 4.0 - 10.5 K/uL   RBC 3.97 (L) 4.22 - 5.81 MIL/uL   Hemoglobin 9.2 (L) 13.0 - 17.0 g/dL   HCT 28.7 (L) 39.0 - 52.0 %   MCV 72.3 (L) 78.0 - 100.0 fL   MCH 23.2 (L) 26.0 - 34.0 pg   MCHC 32.1 30.0 - 36.0 g/dL   RDW 15.4 11.5 - 15.5 %   Platelets 166 150 - 400 K/uL    Comment: PLATELET COUNT CONFIRMED BY SMEAR REPEATED TO VERIFY    Neutrophils Relative % 73 %   Neutro Abs 4.2 1.7 - 7.7 K/uL   Lymphocytes Relative 21 %   Lymphs Abs 1.2 0.7 - 4.0 K/uL   Monocytes Relative 5 %   Monocytes Absolute 0.3 0.1 - 1.0 K/uL   Eosinophils Relative 1 %   Eosinophils Absolute 0.1 0.0 - 0.7 K/uL   Basophils Relative 0 %   Basophils Absolute 0.0 0.0 - 0.1 K/uL  Comprehensive metabolic panel     Status: Abnormal   Collection Time: 12/10/14 10:52 AM  Result Value Ref Range   Sodium 137 135 - 145 mmol/L   Potassium 5.5 (H) 3.5 - 5.1 mmol/L   Chloride 112 (H) 101 - 111 mmol/L   CO2 19 (L) 22 - 32 mmol/L   Glucose, Bld 158 (H) 65 - 99 mg/dL   BUN 54 (H) 6 - 20 mg/dL   Creatinine, Ser 3.31 (H) 0.61 - 1.24 mg/dL   Calcium 7.9 (L) 8.9 - 10.3 mg/dL   Total Protein 6.9 6.5 - 8.1 g/dL   Albumin 2.6 (L) 3.5 - 5.0 g/dL   AST 59 (H) 15 - 41  U/L   ALT 24 17 - 63 U/L   Alkaline Phosphatase 66 38 - 126 U/L   Total Bilirubin 0.5 0.3 - 1.2 mg/dL   GFR calc non Af Amer 17 (L) >60 mL/min   GFR calc Af Amer 20 (L) >60 mL/min    Comment: (NOTE) The eGFR has been calculated using the CKD EPI equation.  This calculation has not been validated in all clinical situations. eGFR's persistently <60 mL/min signify possible Chronic Kidney Disease.    Anion gap 6 5 - 15  Lactic acid, plasma     Status: None   Collection Time: 12/10/14 10:52 AM  Result Value Ref Range   Lactic Acid, Venous 0.6 0.5 - 2.0 mmol/L  Lipase, blood     Status: Abnormal   Collection Time: 12/10/14 10:52 AM  Result Value Ref Range   Lipase 18 (L) 22 - 51 U/L  Urinalysis, Routine w reflex microscopic (not at [facility])     Status: Abnormal   Collection Time: 12/10/14  1:24 PM  Result Value Ref Range   Color, Urine YELLOW YELLOW   APPearance TURBID (A) CLEAR   Specific Gravity, Urine 1.012 1.005 - 1.030   pH 5.5 5.0 - 8.0   Glucose, UA NEGATIVE NEGATIVE mg/dL   Hgb urine dipstick LARGE (A) NEGATIVE   Bilirubin Urine NEGATIVE NEGATIVE   Ketones, ur NEGATIVE NEGATIVE mg/dL   Protein, ur >300 (A) NEGATIVE mg/dL   Urobilinogen, UA 0.2 0.0 - 1.0 mg/dL   Nitrite NEGATIVE NEGATIVE   Leukocytes, UA LARGE (A) NEGATIVE  Urine microscopic-add on     Status: Abnormal   Collection Time: 12/10/14  1:24 PM  Result Value Ref Range   WBC, UA TOO NUMEROUS TO COUNT <3 WBC/hpf   RBC / HPF TOO NUMEROUS TO COUNT <3 RBC/hpf   Bacteria, UA FEW (A) RARE    Ct Abdomen Pelvis Wo Contrast  12/10/2014   CLINICAL DATA:  Nausea and vomiting intermittent that comes on after eating and when laying flat. Pt states hx of acid reflux  EXAM: CT ABDOMEN AND PELVIS WITHOUT CONTRAST  TECHNIQUE: Multidetector CT imaging of the abdomen and pelvis was performed following the standard protocol without IV contrast.  COMPARISON:  06/10/2010  FINDINGS: Lower chest:  Bilateral lower lobe mild dependent  atelectasis  Hepatobiliary: Liver normal without contrast. Innumerable small gallstones. Possible mild gallbladder wall thickening  Pancreas: Normal  Spleen: Normal  Adrenals/Urinary Tract: Progressive significant diffuse bladder wall thickening more so along the left side. Mild left hydroureteronephrosis. Mild progressive perinephric inflammation when compared to prior study. 1 cm low-attenuation area upper pole left kidney possibly a cyst. Right kidney also shows mild perinephric inflammation and mild prominence of the ureter, similar but slightly less prominent when compared to the contralateral side.  Stomach/Bowel: Stool throughout the colon and into the rectum  Vascular/Lymphatic: Extensive aortoiliac atherosclerotic calcification  Reproductive: Prostatic fossa largely obscured by beam attenuation artifact from left hip replacement  Other: No free fluid  Musculoskeletal: No acute findings. Chronic compression deformity L3 and L4.  IMPRESSION: 1. Cholelithiasis. Possible mild gallbladder wall thickening. Correlate clinically and consider right upper quadrant ultrasound if indicated. 2. Progressive bladder wall thickening suggesting hypertrophy from outlet obstruction, although cystitis or infiltrating mass not entirely excluded. This appears to be U related to bilateral mild ureteral dilatation which could reflect an element of obstruction or reflux. There is mild progressive perinephric inflammation. Urinary tract infection is not excluded.   Electronically Signed   By: Skipper Cliche M.D.   On: 12/10/2014 12:46   US Abdomen Limited Ruq  12/10/2014   CLINICAL DATA:  Right upper quadrant pain for 1 week  EXAM: US ABDOMEN LIMITED - RIGHT UPPER QUADRANT  COMPARISON:  None.  FINDINGS: Gallbladder:  Multiple mobile gallstones the largest measuring 7 mm. No Murphy's sign. Wall of the gallbladder is thickened to 7  mm.  Common bile duct:  Diameter: 2 mm  Liver:  No focal lesion identified. Within normal limits in  parenchymal echogenicity.  Incidental note of right renal cysts.  IMPRESSION: Cholelithiasis and gallbladder wall thickening concerning for the possibility of cholecystitis, although there is no sonographic Murphy's sign.   Electronically Signed   By: Skipper Cliche M.D.   On: 12/10/2014 14:01    Review of Systems  Constitutional: Negative for fever and chills.  HENT: Negative.   Eyes:       Glasses  Respiratory: Negative.   Cardiovascular: Negative for chest pain and palpitations.  Gastrointestinal: Positive for nausea and vomiting. Negative for abdominal pain.  Genitourinary: Negative.   Musculoskeletal:       History of right AKA  Skin: Negative.   Neurological: Negative.   Endo/Heme/Allergies: Negative.   Psychiatric/Behavioral: Negative.    Blood pressure 147/74, pulse 58, temperature 97.6 F (36.4 C), temperature source Oral, resp. rate 10, height _0  (1.727 m), weight 87.091 kg (192 lb), SpO2 100 %. Physical Exam  Constitutional: He is oriented to person, place, and time. He appears well-developed and well-nourished. No distress.  HENT:  Head: Normocephalic.  Right Ear: External ear normal.  Left Ear: External ear normal.  Nose: Nose normal.  Mouth/Throat: Oropharynx is clear and moist.  Eyes: Conjunctivae and EOM are normal. Right eye exhibits no discharge. Left eye exhibits no discharge.  Neck: Neck supple. No tracheal deviation present.  Cardiovascular: Normal rate and normal heart sounds.   Heart rate 60  Respiratory: Effort normal and breath sounds normal. No stridor. No respiratory distress. He has no wheezes. He has no rales.  GI: Soft. He exhibits no distension. There is no tenderness. There is no rebound and no guarding.  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  Skin: Skin is warm.  Psychiatric:  He has a normal mood and affect.    Assessment/Plan: Cholecystitis with cholelithiasis - agree with medical admission, IV antibiotics, Sips clears only with nothing by mouth after midnight. Hold Eliquis for now. Cardiology consultation both to advise them of his admission and evaluate him for operative clearance. We'll consider laparoscopic cholecystectomy this admission pending the above. I spoke with him and his wife as well as the physician from the teaching service.  THOMPSON,BURKE E 12/10/2014, 4:35 PM

## 2014-12-10 NOTE — ED Notes (Signed)
MD at bedside. 

## 2014-12-10 NOTE — ED Provider Notes (Signed)
CSN: [id number]     Arrival date & time 12/10/14  0383 History   First MD Initiated Contact with Patient 12/10/14 1026     Chief Complaint  Patient presents with  . Nausea  . Emesis     (Consider location/radiation/quality/duration/timing/severity/associated sxs/prior Treatment) Patient is a 69 y.o. male presenting with general illness. The history is provided by the patient.  Illness Severity:  Moderate Onset quality:  Sudden Duration:  2 days Timing:  Constant Progression:  Unchanged Chronicity:  Recurrent Associated symptoms: nausea and vomiting   Associated symptoms: no abdominal pain, no chest pain, no congestion, no diarrhea, no fever, no headaches, no myalgias, no rash and no shortness of breath    69 yo M with a chief complaint of nausea and vomiting. This been going on for the past couple days. Patient states he's had some hiccuping in the morning and then was followed by emesis. Also having emesis after eating. Denies any abdominal pain denies any diarrhea denies fevers or chills. Denies any chest pain or shortness of breath. Usually occurs in the morning and then resolves later in the day. Patient has a history of reflux disease that he thinks this may be similar to.  Past Medical History  Diagnosis Date  . Hyperlipidemia LDL goal < 100 04/02/2006  . Essential hypertension 12/09/2006  . Benign prostatic hypertrophy with nocturia 07/07/2006  . Chronic venous insufficiency 04/12/2010  . Obesity (BMI 30.0-34.9) 01/15/2012  . Constipation 05/25/2009    Intermittent   . Microcytic normochromic anemia 05/27/2006  . Seasonal allergic rhinitis 05/25/2009  . Internal and external hemorrhoids without complication 3/38/3291  . Coronary artery disease 04/02/2006    s/p RCA DES 2006, low risk myoview in 2011  . 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 Type 2 diabetes mellitus with stage 3 chronic kidney disease   . Closed displaced fracture of left femoral neck 06/14/2014    s/p left hip hemiarthroplasty June 14, 2014   . Atrial flutter 07/26/2014    May 2016, CHA2DS2VASc = 4 -> Eliquis, spontaneous conversion to NSR   . Degenerative joint disease involving multiple joints 02/02/2007  . Gastroesophageal reflux disease   . Aortic atherosclerosis 11/30/2014    Seen on CT, currently asymptomatic   Past Surgical History  Procedure Laterality Date  . Pilonidal cyst excision  1990's  . Refractive surgery Bilateral   . Fracture surgery Left     "hip/stump"  . Total hip arthroplasty Left 06/14/2014    Procedure: HEMI HIP ARTHROPLASTY ANTERIOR APPROACH;  Surgeon: Leandrew Koyanagi, MD;  Location: [facility];  Service: Orthopedics;  Laterality: Left;  . Joint replacement      Left Hip  . Leg amputation above knee Right ~ 2008  . Coronary angioplasty with stent placement     Family History  Problem Relation Age of Onset  . Hypertension Mother   . Heart attack Father   . Breast cancer Sister   . Arthritis Sister   . Heart attack Brother   . Diabetes Brother   . Stroke Brother   .  Pneumonia Daughter   . Diabetes Brother   . Alcoholism Brother   . Diabetes Brother   . Arthritis Sister     Bilateral knee replacement  . HIV Daughter   . Hypertension Daughter   . Drug abuse Daughter   . Schizophrenia Daughter   . Hypertension Daughter   . Drug abuse Daughter   . Bipolar disorder Daughter    Social History  Substance Use Topics  . Smoking status: Former Smoker -- 1.00 packs/day for 10 years    Types: Cigarettes    Quit date: 06/16/1968  . Smokeless tobacco: Never  Used  . Alcohol Use: Yes     Comment: "quit alcohol in the 1960's"    Review of Systems  Constitutional: Negative for fever and chills.  HENT: Negative for congestion and facial swelling.   Eyes: Negative for discharge and visual disturbance.  Respiratory: Negative for shortness of breath.   Cardiovascular: Negative for chest pain and palpitations.  Gastrointestinal: Positive for nausea and vomiting. Negative for abdominal pain and diarrhea.  Musculoskeletal: Negative for myalgias and arthralgias.  Skin: Negative for color change and rash.  Neurological: Negative for tremors, syncope and headaches.  Psychiatric/Behavioral: Negative for confusion and dysphoric mood.      Allergies  Amoxicillin; Lisinopril; Pravastatin; Tamsulosin; and Doxycycline  Home Medications   Prior to Admission medications   Medication Sig Start Date End Date Taking? Authorizing Zakary Kimura  ACCU-CHEK AVIVA PLUS test strip 1 strip by Does not apply route 2 (two) times daily. 11/16/14  Yes Historical Tyleah Loh, MD  ACCU-CHEK SOFTCLIX LANCETS lancets 1 each by Other route 2 (two) times daily. 11/16/14  Yes Historical Armen Waring, MD  amiodarone (PACERONE) 200 MG tablet Take 1 tablet (200 mg total) by mouth daily. 08/31/14  Yes Oval Linsey, MD  amLODipine (NORVASC) 10 MG tablet Take 10 mg by mouth daily. 07/18/14  Yes Historical Markeya Mincy, MD  apixaban (ELIQUIS) 5 MG TABS tablet Take 1 tablet (5 mg total) by mouth 2 (two) times daily. 08/31/14  Yes Oval Linsey, MD  Blood Glucose Calibration (ACCU-CHEK AVIVA) SOLN 1 drop by Does not apply route as needed. 11/16/14  Yes Historical Kamarri Lovvorn, MD  Blood Glucose Monitoring Suppl (ACCU-CHEK AVIVA PLUS) W/DEVICE KIT 1 cartridge by Does not apply route as needed. 11/16/14  Yes Historical Zeppelin Commisso, MD  calcium citrate-vitamin D (CITRACAL+D) 315-200 MG-UNIT per tablet Take 1 tablet by mouth daily.    Yes Historical Jaydrien Wassenaar, MD  carvedilol (COREG) 12.5 MG tablet Take 1 tablet (12.5  mg total) by mouth 2 (two) times daily with a meal. 01/05/14  Yes Oval Linsey, MD  docusate sodium (COLACE) 100 MG capsule Take 100 mg by mouth 2 (two) times daily as needed (constipation).    Yes Historical Anila Bojarski, MD  famotidine (PEPCID) 20 MG tablet Take 1 tablet (20 mg total) by mouth 2 (two) times daily. Patient taking differently: Take 20 mg by mouth 2 (two) times daily as needed for heartburn.  07/28/14  Yes Lucious Groves, DO  fish oil-omega-3 fatty acids 1000 MG capsule Take 1 g by mouth daily. 01/15/12  Yes Oval Linsey, MD  fluticasone [facility]) 50 MCG/ACT nasal spray Place 2 sprays into both nostrils daily as needed for allergies. 12/29/13  Yes Bartholomew Crews, MD  furosemide (LASIX) 40 MG tablet Take 40 mg by mouth daily as needed for fluid. Take 1 tablet by mouth daily as needed for a weight gain of more than 5 lbs in a week.   Yes Historical  Iriana Artley, MD  hydroxypropyl methylcellulose (ISOPTO TEARS) 2.5 % ophthalmic solution Place 2 drops into both eyes 4 (four) times daily as needed for dry eyes.   Yes Historical Emy Angevine, MD  insulin aspart protamine - aspart (NOVOLOG MIX 70/30 FLEXPEN) (70-30) 100 UNIT/ML FlexPen Inject 0.1 mLs (10 Units total) into the skin 2 (two) times daily with a meal. Patient taking differently: Inject 5-15 Units into the skin 2 (two) times daily with a meal. 120-130 = 0 units, ~250 = 5 units, 260-300 = 10 units, >300 = 15 units 11/22/14  Yes Oval Linsey, MD  Insulin Pen Needle (NOVOFINE) 30G X 8 MM MISC Inject SQ as directed twice daily, use new pen needle for each injection 09/10/10  Yes Acquanetta Chain, DO  Multiple Vitamins-Minerals (MULTIVITAMIN WITH MINERALS) tablet Take 1 tablet by mouth daily.   Yes Historical Ieesha Abbasi, MD  pravastatin (PRAVACHOL) 40 MG tablet Take 1 tablet (40 mg total) by mouth every evening. 10/13/14  Yes Lelon Perla, MD  terazosin (HYTRIN) 10 MG capsule Take 1 capsule (10 mg total) by mouth at bedtime. 10/20/14  Yes  Oval Linsey, MD   BP 142/75 mmHg  Pulse 58  Temp(Src) 97.6 F (36.4 C) (Oral)  Resp 15  Ht $R'5\' 8"'eQ$  (1.727 m)  Wt 192 lb (87.091 kg)  BMI 29.20 kg/m2  SpO2 100% Physical Exam  Constitutional: He is oriented to person, place, and time. He appears well-developed and well-nourished.  HENT:  Head: Normocephalic and atraumatic.  Eyes: EOM are normal. Pupils are equal, round, and reactive to light.  Neck: Normal range of motion. Neck supple. No JVD present.  Cardiovascular: Normal rate and regular rhythm.  Exam reveals no gallop and no friction rub.   No murmur heard. Pulmonary/Chest: No respiratory distress. He has no wheezes. He has no rales.  Abdominal: He exhibits no distension. There is no tenderness. There is no rebound and no guarding.  Musculoskeletal: Normal range of motion.  Neurological: He is alert and oriented to person, place, and time.  Skin: No rash noted. No pallor.  Psychiatric: He has a normal mood and affect. His behavior is normal.    ED Course  Procedures (including critical care time) Labs Review Labs Reviewed  CBC WITH DIFFERENTIAL/PLATELET - Abnormal; Notable for the following:    RBC 3.97 (*)    Hemoglobin 9.2 (*)    HCT 28.7 (*)    MCV 72.3 (*)    MCH 23.2 (*)    All other components within normal limits  COMPREHENSIVE METABOLIC PANEL - Abnormal; Notable for the following:    Potassium 5.5 (*)    Chloride 112 (*)    CO2 19 (*)    Glucose, Bld 158 (*)    BUN 54 (*)    Creatinine, Ser 3.31 (*)    Calcium 7.9 (*)    Albumin 2.6 (*)    AST 59 (*)    GFR calc non Af Amer 17 (*)    GFR calc Af Amer 20 (*)    All other components within normal limits  LIPASE, BLOOD - Abnormal; Notable for the following:    Lipase 18 (*)    All other components within normal limits  URINALYSIS, ROUTINE W REFLEX MICROSCOPIC (NOT AT [facility]) - Abnormal; Notable for the following:    APPearance TURBID (*)    Hgb urine dipstick LARGE (*)    Protein, ur >300 (*)     Leukocytes, UA LARGE (*)    All other components  within normal limits  URINE MICROSCOPIC-ADD ON - Abnormal; Notable for the following:    Bacteria, UA FEW (*)    All other components within normal limits  LACTIC ACID, PLASMA    Imaging Review Ct Abdomen Pelvis Wo Contrast  12/10/2014   CLINICAL DATA:  Nausea and vomiting intermittent that comes on after eating and when laying flat. Pt states hx of acid reflux  EXAM: CT ABDOMEN AND PELVIS WITHOUT CONTRAST  TECHNIQUE: Multidetector CT imaging of the abdomen and pelvis was performed following the standard protocol without IV contrast.  COMPARISON:  06/10/2010  FINDINGS: Lower chest:  Bilateral lower lobe mild dependent atelectasis  Hepatobiliary: Liver normal without contrast. Innumerable small gallstones. Possible mild gallbladder wall thickening  Pancreas: Normal  Spleen: Normal  Adrenals/Urinary Tract: Progressive significant diffuse bladder wall thickening more so along the left side. Mild left hydroureteronephrosis. Mild progressive perinephric inflammation when compared to prior study. 1 cm low-attenuation area upper pole left kidney possibly a cyst. Right kidney also shows mild perinephric inflammation and mild prominence of the ureter, similar but slightly less prominent when compared to the contralateral side.  Stomach/Bowel: Stool throughout the colon and into the rectum  Vascular/Lymphatic: Extensive aortoiliac atherosclerotic calcification  Reproductive: Prostatic fossa largely obscured by beam attenuation artifact from left hip replacement  Other: No free fluid  Musculoskeletal: No acute findings. Chronic compression deformity L3 and L4.  IMPRESSION: 1. Cholelithiasis. Possible mild gallbladder wall thickening. Correlate clinically and consider right upper quadrant ultrasound if indicated. 2. Progressive bladder wall thickening suggesting hypertrophy from outlet obstruction, although cystitis or infiltrating mass not entirely excluded. This  appears to be U related to bilateral mild ureteral dilatation which could reflect an element of obstruction or reflux. There is mild progressive perinephric inflammation. Urinary tract infection is not excluded.   Electronically Signed   By: Skipper Cliche M.D.   On: 12/10/2014 12:46   US Abdomen Limited Ruq  12/10/2014   CLINICAL DATA:  Right upper quadrant pain for 1 week  EXAM: US ABDOMEN LIMITED - RIGHT UPPER QUADRANT  COMPARISON:  None.  FINDINGS: Gallbladder:  Multiple mobile gallstones the largest measuring 7 mm. No Murphy's sign. Wall of the gallbladder is thickened to 7 mm.  Common bile duct:  Diameter: 2 mm  Liver:  No focal lesion identified. Within normal limits in parenchymal echogenicity.  Incidental note of right renal cysts.  IMPRESSION: Cholelithiasis and gallbladder wall thickening concerning for the possibility of cholecystitis, although there is no sonographic Murphy's sign.   Electronically Signed   By: Skipper Cliche M.D.   On: 12/10/2014 14:01   I have personally reviewed and evaluated these images and lab results as part of my medical decision-making.   EKG Interpretation None      MDM   Final diagnoses:  RUQ pain  Calculus of gallbladder with acute cholecystitis and obstruction    69 yo male with a chief complaint of nausea and vomiting. Patient has had difficulty with this over the past couple days. Difficulty eating drinking at home. Patient denies any abdominal pain. CT scan concerning for possible cholelithiasis with gallbladder wall thickening. Ultrasound with color wall 7 mm as well as a submillimeter gallbladder stone. Discussed the case with Dr. Grandville Silos from surgery recommended medical admission with surgery consult.  The patients results and plan were reviewed and discussed.   Any x-rays performed were independently reviewed by myself.   Differential diagnosis were considered with the presenting HPI.  Medications  ondansetron (ZOFRAN) injection  4 mg (0  mg Intravenous Hold 12/10/14 1054)  0.9 %  sodium chloride infusion (0 mLs Intravenous Stopped 12/10/14 1249)    Filed Vitals:   12/10/14 1200 12/10/14 1245 12/10/14 1300 12/10/14 1315  BP: 146/79 147/79 144/79 142/75  Pulse: 58 59 61 58  Temp:      TempSrc:      Resp: $Remo'13 12 23 15  'oSlFr$ Height:      Weight:      SpO2: 100% 98% 100% 100%    Final diagnoses:  RUQ pain  Calculus of gallbladder with acute cholecystitis and obstruction    Admission/ observation were discussed with the admitting physician, patient and/or family and they are comfortable with the plan.      Deno Etienne, DO 12/10/14 1534

## 2014-12-10 NOTE — ED Notes (Signed)
Pt from home for eval of intermittent n/v that comes on after eating and when laying flat. Pt states hx of acid reflux but  Has not been taking his meds x2 months. Pt denies any abd pain at this time, denies any hemoptysis. Pt alert and oriented, nad noted.

## 2014-12-10 NOTE — Progress Notes (Signed)
Date: 12/10/2014               Patient Name:  [name] [name] MRN: [id number]  DOB: [birth year] Age / Sex: 69 y.o., male   PCP: Doneen Poisson, MD         Medical Service: Internal Medicine Teaching Service         Attending Physician: Dr. Tyson Alias, MD    First Contact: Dr. Karma Greaser Pager: [phone]  Second Contact: Dr. Johna Roles Pager: [phone]       After Hours (After 5p/  First Contact Pager: [phone]  weekends / holidays): Second Contact Pager: [phone]   Chief Complaint: Nausea and vomiting  History of Present Illness: [name] [name] is a 69 year old male with a PMH of HTN, HLD, DM type 2 with right AKA, A. FIB on Eliquis, GERD presents to [facility] ED with 2-3 day history of nausea and vomiting. He reports that the past 2-3 days he has been having nausea that has been waking him up at night. He thought it was related to his GERD at first but was unimproved with treatment. He then began to have emesis and came to the ED for further evaluation. Symptoms are not associated with eating. He denies any fevers, chills, abdominal pain, constipation, diarrhea.  He got a CT abdomen without contrast in the ED which was concerning for cholelithiasis with gallbladder wall thickening. RUQ ultrasound revealed a multiple mobile gallstone with the largest measuring 7 mm. Gallbladder wall thickened to 7 mm. Negative Murphy's sign.   Meds: Current Facility-Administered Medications  Medication Dose Route Frequency Provider Last Rate Last Dose  . 0.9 %  sodium chloride infusion   Intravenous Continuous Marjan Rabbani, MD 125 mL/hr at 12/10/14 1710    . amiodarone (PACERONE) tablet 200 mg  200 mg Oral Daily Marjan Rabbani, MD   200 mg at 12/10/14 1820  . [START ON 12/11/2014] carvedilol (COREG) tablet 12.5 mg  12.5 mg Oral BID WC Marjan Rabbani, MD      . fluticasone (FLONASE) 50 MCG/ACT nasal spray 2 spray  2 spray Each Nare Daily PRN Marjan Rabbani, MD      . heparin injection 5,000 Units  5,000 Units  Subcutaneous 3 times per day Otis Brace, MD   5,000 Units at 12/10/14 1821  . hydroxypropyl methylcellulose / hypromellose (ISOPTO TEARS / GONIOVISC) 2.5 % ophthalmic solution 2 drop  2 drop Both Eyes QID PRN Marjan Rabbani, MD      . insulin aspart (novoLOG) injection 0-15 Units  0-15 Units Subcutaneous TID WC Otis Brace, MD   2 Units at 12/10/14 1714  . [START ON 12/11/2014] pneumococcal 23 valent vaccine (PNU-IMMUNE) injection 0.5 mL  0.5 mL Intramuscular Tomorrow-1000 Tyson Alias, MD      . promethazine (PHENERGAN) injection 12.5 mg  12.5 mg Intravenous Q6H PRN Marjan Rabbani, MD      . senna-docusate (Senokot-S) tablet 1 tablet  1 tablet Oral QHS Otis Brace, MD        Allergies: Allergies as of 12/10/2014 - Review Complete 12/10/2014  Allergen Reaction Noted  . Amoxicillin Swelling and Other (See Comments) 04/21/2012  . Lisinopril Other (See Comments) 11/22/2014  . Pravastatin Other (See Comments) 05/26/2014  . Tamsulosin Other (See Comments)   . Doxycycline Rash and Other (See Comments) 12/02/2011   Past Medical History  Diagnosis Date  . Hyperlipidemia LDL goal < 100 04/02/2006  . Essential hypertension 12/09/2006  . Benign prostatic hypertrophy with nocturia 07/07/2006  .  Chronic venous insufficiency 04/12/2010  . Obesity (BMI 30.0-34.9) 01/15/2012  . Constipation 05/25/2009    Intermittent   . Microcytic normochromic anemia 05/27/2006  . Seasonal allergic rhinitis 05/25/2009  . Internal and external hemorrhoids without complication 08/20/2012  . Coronary artery disease 04/02/2006    s/p RCA DES 2006, low risk myoview in 2011  . 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 Type 2 diabetes mellitus with peripheral artery disease 05/27/2006    Absent  pulses in the left foot   . 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 Type 2 diabetes mellitus with stage 3 chronic kidney disease   . Closed displaced fracture of left femoral neck 06/14/2014    s/p left hip hemiarthroplasty June 14, 2014   . Atrial flutter 07/26/2014    May 2016, CHA2DS2VASc = 4 -> Eliquis, spontaneous conversion to NSR   . Degenerative joint disease involving multiple joints 02/02/2007  . Gastroesophageal reflux disease   . Aortic atherosclerosis 11/30/2014    Seen on CT, currently asymptomatic   Past Surgical History  Procedure Laterality Date  . Pilonidal cyst excision  1990's  . Refractive surgery Bilateral   . Fracture surgery Left     "hip/stump"  . Total hip arthroplasty Left 06/14/2014    Procedure: HEMI HIP ARTHROPLASTY ANTERIOR APPROACH;  Surgeon: Tarry Kos, MD;  Location: [facility] OR;  Service: Orthopedics;  Laterality: Left;  . Joint replacement      Left Hip  . Leg amputation above knee Right ~ 2008  . Coronary angioplasty with stent placement     Family History  Problem Relation Age of Onset  . Hypertension Mother   . Heart attack Father   . Breast cancer Sister   . Arthritis Sister   . Heart attack Brother   . Diabetes Brother   . Stroke Brother   . Pneumonia Daughter   . Diabetes Brother   . Alcoholism Brother   . Diabetes Brother   . Arthritis Sister     Bilateral knee replacement  . HIV Daughter   . Hypertension Daughter   . Drug abuse Daughter   . Schizophrenia Daughter   . Hypertension Daughter   . Drug abuse Daughter   . Bipolar disorder Daughter    Social History   Social History  . Marital Status: Married    Spouse Name: N/A  . Number of Children: N/A  . Years of Education: N/A   Occupational History  . Not on file.   Social History Main Topics  . Smoking status: Former Smoker -- 1.00 packs/day for 10 years    Types: Cigarettes    Quit date: 06/16/1968  . Smokeless  tobacco: Never Used  . Alcohol Use: Yes     Comment: "quit alcohol in the 1960's"  . Drug Use: No  . Sexual Activity: Yes    Birth Control/ Protection: None   Other Topics Concern  . Not on file   Social History Narrative    Review of Systems: Review of Systems  Constitutional: Negative for fever and chills.  Respiratory: Negative for cough and shortness of breath.   Cardiovascular: Negative for chest pain and palpitations.  Gastrointestinal: Positive for nausea and vomiting. Negative for heartburn, abdominal pain, diarrhea  and constipation.  Genitourinary: Negative for dysuria, urgency and frequency.  Neurological: Negative for dizziness.    Physical Exam: Blood pressure 148/78, pulse 62, temperature 97.4 F (36.3 C), temperature source Oral, resp. rate 15, height 5\' 8"  (1.727 m), weight 183 lb (83.008 kg), SpO2 100 %.   GENERAL- alert, co-operative, appears as stated age, not in any distress. HEENT- Atraumatic, normocephalic, PERRL, EOMI, oral mucosa appears dry. No carotid bruit, no cervical LN enlargement, thyroid does not appear enlarged, neck supple. CARDIAC- RRR, no murmurs, rubs or gallops. RESP- Moving equal volumes of air, and clear to auscultation bilaterally, no wheezes or crackles. ABDOMEN- Soft, nontender, no guarding or rebound, no palpable masses or organomegaly, bowel sounds present. BACK- Normal curvature of the spine, No tenderness along the vertebrae, no CVA tenderness. NEURO- No obvious Cr N abnormality, strenght upper and lower extremities- 5/5, Sensation intact- globally, DTRs- Normal, finger to nose test normal bilat, rapid alternating movement- intact, Gait- Normal. EXTREMITIES- pulse 2+, symmetric, no pedal edema. SKIN- Warm, dry, No rash or lesion. PSYCH- Normal mood and affect, appropriate thought content and speech.  Lab results: Basic Metabolic Panel:  Recent Labs  03/47/42 1052  NA 137  K 5.5*  CL 112*  CO2 19*  GLUCOSE 158*  BUN 54*    CREATININE 3.31*  CALCIUM 7.9*   Liver Function Tests:  Recent Labs  12/10/14 1052  AST 59*  ALT 24  ALKPHOS 66  BILITOT 0.5  PROT 6.9  ALBUMIN 2.6*    Recent Labs  12/10/14 1052  LIPASE 18*   No results for input(s): AMMONIA in the last 72 hours. CBC:  Recent Labs  12/10/14 1052  WBC 5.8  NEUTROABS 4.2  HGB 9.2*  HCT 28.7*  MCV 72.3*  PLT 166   Cardiac Enzymes: No results for input(s): CKTOTAL, CKMB, CKMBINDEX, TROPONINI in the last 72 hours. BNP: No results for input(s): PROBNP in the last 72 hours. D-Dimer: No results for input(s): DDIMER in the last 72 hours. CBG:  Recent Labs  12/10/14 1709  GLUCAP 146*   Hemoglobin A1C: No results for input(s): HGBA1C in the last 72 hours. Fasting Lipid Panel: No results for input(s): CHOL, HDL, LDLCALC, TRIG, CHOLHDL, LDLDIRECT in the last 72 hours. Thyroid Function Tests: No results for input(s): TSH, T4TOTAL, FREET4, T3FREE, THYROIDAB in the last 72 hours. Anemia Panel: No results for input(s): VITAMINB12, FOLATE, FERRITIN, TIBC, IRON, RETICCTPCT in the last 72 hours. Coagulation: No results for input(s): LABPROT, INR in the last 72 hours. Urine Drug Screen: Drugs of Abuse  No results found for: LABOPIA, COCAINSCRNUR, LABBENZ, AMPHETMU, THCU, LABBARB  Alcohol Level: No results for input(s): ETH in the last 72 hours. Urinalysis:  Recent Labs  12/10/14 1324  COLORURINE YELLOW  LABSPEC 1.012  PHURINE 5.5  GLUCOSEU NEGATIVE  HGBUR LARGE*  BILIRUBINUR NEGATIVE  KETONESUR NEGATIVE  PROTEINUR >300*  UROBILINOGEN 0.2  NITRITE NEGATIVE  LEUKOCYTESUR LARGE*   Misc. Labs:   Imaging results:  Ct Abdomen Pelvis Wo Contrast  12/10/2014   CLINICAL DATA:  Nausea and vomiting intermittent that comes on after eating and when laying flat. Pt states hx of acid reflux  EXAM: CT ABDOMEN AND PELVIS WITHOUT CONTRAST  TECHNIQUE: Multidetector CT imaging of the abdomen and pelvis was performed following the  standard protocol without IV contrast.  COMPARISON:  06/10/2010  FINDINGS: Lower chest:  Bilateral lower lobe mild dependent atelectasis  Hepatobiliary: Liver normal without contrast. Innumerable small gallstones. Possible mild gallbladder wall thickening  Pancreas:  Normal  Spleen: Normal  Adrenals/Urinary Tract: Progressive significant diffuse bladder wall thickening more so along the left side. Mild left hydroureteronephrosis. Mild progressive perinephric inflammation when compared to prior study. 1 cm low-attenuation area upper pole left kidney possibly a cyst. Right kidney also shows mild perinephric inflammation and mild prominence of the ureter, similar but slightly less prominent when compared to the contralateral side.  Stomach/Bowel: Stool throughout the colon and into the rectum  Vascular/Lymphatic: Extensive aortoiliac atherosclerotic calcification  Reproductive: Prostatic fossa largely obscured by beam attenuation artifact from left hip replacement  Other: No free fluid  Musculoskeletal: No acute findings. Chronic compression deformity L3 and L4.  IMPRESSION: 1. Cholelithiasis. Possible mild gallbladder wall thickening. Correlate clinically and consider right upper quadrant ultrasound if indicated. 2. Progressive bladder wall thickening suggesting hypertrophy from outlet obstruction, although cystitis or infiltrating mass not entirely excluded. This appears to be U related to bilateral mild ureteral dilatation which could reflect an element of obstruction or reflux. There is mild progressive perinephric inflammation. Urinary tract infection is not excluded.   Electronically Signed   By: Esperanza Heir M.D.   On: 12/10/2014 12:46   US Abdomen Limited Ruq  12/10/2014   CLINICAL DATA:  Right upper quadrant pain for 1 week  EXAM: US ABDOMEN LIMITED - RIGHT UPPER QUADRANT  COMPARISON:  None.  FINDINGS: Gallbladder:  Multiple mobile gallstones the largest measuring 7 mm. No Murphy's sign. Wall of the  gallbladder is thickened to 7 mm.  Common bile duct:  Diameter: 2 mm  Liver:  No focal lesion identified. Within normal limits in parenchymal echogenicity.  Incidental note of right renal cysts.  IMPRESSION: Cholelithiasis and gallbladder wall thickening concerning for the possibility of cholecystitis, although there is no sonographic Murphy's sign.   Electronically Signed   By: Esperanza Heir M.D.   On: 12/10/2014 14:01    Other results: EKG: Junctional rhythm with RBBB  Assessment & Plan by Problem: Active Problems:   Type 2 diabetes mellitus with neurological manifestations   Essential hypertension   Constipation   Gastroesophageal reflux disease without esophagitis   Atrial flutter   Cholelithiasis   Symptomatic cholelithiasis  69 year old male with a PMH of DM2 and GERD presents with 2-3 day history of nausea and vomiting found to have cholelithiasis.   Symptomatic cholelithiasis: Mr. Gosse has had a 2-3 day history of nausea and vomiting. Abdominal U/S with gallbladder wall thickening and multiple stone with the largest being 7 mm. He is afebrile with no leukocytosis and no abdominal pain. Murphy's sign negative. Surgery consulted and will likely go for lap chole on Tuesday. Lipase 18. Lactic acid 0.6.  - Ceftriaxone 2g IV daily - Clear liquids, NPO at midnight - NS 125 mL/hr for 12 hours - Phenergan 12.5 mg IV q6hr prn - Appreciate surgery consult  UTI: U/A with large leukocytes, negative nitrite, few bacteria. Has a history of BPH. Denies any urinary symptoms.  - f/u urine culture - covered with Ceftriaxone  HTN: BP stable with SBP in 140s.  - Coreg 12.5 mg daily  DM2 with CKD stage 3: Last HgbA1c on 05/26/14 was 6.3. On NPH 70/30 10 units qhs. He has been unable to tolerate an ACE-i 2/2 his undering CKD. ACE-i was stopped and renal function did not improve despite stopping the ACE-i. PCP (Dr. Josem Kaufmann) wanted to get a renal ultrasound for further evaluation.  - SSI - CBG  monitoring - Recheck A1c - Consider renal ultrasound  Anemia of  chronic disease 2/2 CKD: Hgb stable at 9.2. Baseline 8-9.   BPH: Followed by Dr. Brunilda Payor. On terazosin 10 mg qhs.   A. Flutter: He was scheduled for ablation on 9/19. Spoke with cardiology and they will not do his ablation during this admission. He needs to be on Eliquis for the ablation. - Amiodarone 200 mg daily - Coreg 12.5 mg bid  Constipation:  - Senokot qhs  GERD: On Pepcid 20 mg prn. Continue as needed.   HCV: HCV virus AB positive at last clinic visit on 8/31. Will check Viral Load.   DVT PPx: Heparin 5000 units SQ q8hr  Dispo: Disposition is deferred at this time, awaiting improvement of current medical problems. Anticipated discharge in approximately 1-3 day(s).   The patient does have a current PCP Doneen Poisson, MD) and does need an [facility] follow-up appointment after discharge.  The patient does not have transportation limitations that hinder transportation to clinic appointments.  Signed: Valentino Nose, MD 12/10/2014, 7:38 PM

## 2014-12-10 NOTE — ED Notes (Signed)
Patient transported to Ultrasound 

## 2014-12-10 NOTE — ED Notes (Signed)
Pt here with c/o n/v , pt thinks it is his reflux , pt takes his reflux meds and feels better but only takes it once a day

## 2014-12-10 NOTE — Progress Notes (Signed)
  ANTIBIOTIC CONSULT NOTE - INITIAL  Pharmacy Consult for Zosyn Indication: cholecystitis   Allergies  Allergen Reactions  . Amoxicillin Swelling and Other (See Comments)    Puffy eyes and abdominal pain with Amoxicillin PO  . Lisinopril Other (See Comments)    Acute kidney injury  . Pravastatin Other (See Comments)    Weakness and fatigue  . Tamsulosin Other (See Comments)    REACTION: dry throat, sweating, blurred vision  . Doxycycline Rash and Other (See Comments)    Questionable drug rxn rash    Patient Measurements: Height:  (172.7 cm) Weight: 183 lb (83.008 kg) IBW/kg (Calculated) : 68.4 Adjusted Body Weight:   Vital Signs: Temp: 97.4 F (36.3 C) (09/18 1653) Temp Source: Oral (09/18 1653) BP: 148/78 mmHg (09/18 1653) Pulse Rate: 62 (09/18 1653) Intake/Output from previous day:   Intake/Output from this shift:    Labs:  Recent Labs  12/10/14 1052  WBC 5.8  HGB 9.2*  PLT 166  CREATININE 3.31*   Estimated Creatinine Clearance: 21.5 mL/min (by C-G formula based on Cr of 3.31). No results for input(s): VANCOTROUGH, VANCOPEAK, VANCORANDOM, GENTTROUGH, GENTPEAK, GENTRANDOM, TOBRATROUGH, TOBRAPEAK, TOBRARND, AMIKACINPEAK, AMIKACINTROU, AMIKACIN in the last 72 hours.   Microbiology: No results found for this or any previous visit (from the past 720 hour(s)).  Medical History: Past Medical History  Diagnosis Date  . Hyperlipidemia LDL goal < 100 04/02/2006  . Essential hypertension 12/09/2006  . Benign prostatic hypertrophy with nocturia 07/07/2006  . Chronic venous insufficiency 04/12/2010  . Obesity (BMI 30.0-34.9) 01/15/2012  . Constipation 05/25/2009    Intermittent   . Microcytic normochromic anemia 05/27/2006  . Seasonal allergic rhinitis 05/25/2009  . Internal and external hemorrhoids without complication 08/20/2012  . Coronary artery disease 04/02/2006    s/p RCA DES 2006, low risk myoview in 2011  . Type 2 diabetes mellitus with neurological  manifestations 04/02/2006    Neuropathy of the left foot   . 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 Type 2 diabetes mellitus with stage 3 chronic kidney disease   . Closed displaced fracture of left femoral neck 06/14/2014    s/p left hip hemiarthroplasty June 14, 2014   . Atrial flutter 07/26/2014    May 2016, CHA2DS2VASc = 4 -> Eliquis, spontaneous conversion to NSR   . Degenerative joint disease involving multiple joints 02/02/2007  . Gastroesophageal reflux disease   . Aortic atherosclerosis 11/30/2014    Seen on CT, currently asymptomatic    Medications:  See EMR  Assessment: 69 yo male admitted with cholelithiasis and concern for cholecystitis. Will initiate IV abx, no leukocytosis, afebrile. Scr up to 3.3, eCrCl ~ 20 ml/min.   Goal of Therapy:  Resolution of infection  Plan:  -Zosyn 3.375 g IV over 30 minutes x1 in ED, then 2.25 g IV q6h -Monitor renal fx, cultures, duration of therapy    Agapito Games, PharmD, BCPS Clinical Pharmacist Pager: [phone] 12/10/2014 5:59 PM

## 2014-12-11 ENCOUNTER — Ambulatory Visit (HOSPITAL_COMMUNITY): Admission: RE | Admit: 2014-12-11 | Payer: Medicare PPO | Source: Ambulatory Visit | Admitting: Internal Medicine

## 2014-12-11 ENCOUNTER — Encounter (HOSPITAL_COMMUNITY)
Admission: EM | Disposition: A | Discharge status: Home / Self Care | Payer: Self-pay | Source: Home / Self Care | Attending: Student in an Organized Health Care Education/Training Program

## 2014-12-11 ENCOUNTER — Ambulatory Visit: Payer: Medicare PPO | Admitting: Nurse Practitioner

## 2014-12-11 ENCOUNTER — Encounter (HOSPITAL_COMMUNITY): Payer: Self-pay | Admitting: Physician Assistant

## 2014-12-11 DIAGNOSIS — Z2252 Carrier of viral hepatitis C: Secondary | ICD-10-CM

## 2014-12-11 DIAGNOSIS — I2510 Atherosclerotic heart disease of native coronary artery without angina pectoris: Secondary | ICD-10-CM

## 2014-12-11 DIAGNOSIS — K8020 Calculus of gallbladder without cholecystitis without obstruction: Secondary | ICD-10-CM

## 2014-12-11 DIAGNOSIS — Z7901 Long term (current) use of anticoagulants: Secondary | ICD-10-CM

## 2014-12-11 DIAGNOSIS — B182 Chronic viral hepatitis C: Secondary | ICD-10-CM | POA: Diagnosis present

## 2014-12-11 DIAGNOSIS — Z01810 Encounter for preprocedural cardiovascular examination: Secondary | ICD-10-CM

## 2014-12-11 DIAGNOSIS — I4892 Unspecified atrial flutter: Secondary | ICD-10-CM

## 2014-12-11 LAB — LIPID PANEL
Cholesterol: 132 mg/dL (ref 0–200)
HDL: 23 mg/dL — ABNORMAL LOW (ref >40)
LDL CALC: 72 mg/dL (ref 0–99)
Total CHOL/HDL Ratio: 5.7 RATIO
Triglycerides: 187 mg/dL — ABNORMAL HIGH (ref <150)
VLDL: 37 mg/dL (ref 0–40)

## 2014-12-11 LAB — RENAL FUNCTION PANEL
ANION GAP: 6 (ref 5–15)
Albumin: 2.4 g/dL — ABNORMAL LOW (ref 3.5–5.0)
BUN: 42 mg/dL — AB (ref 6–20)
CHLORIDE: 113 mmol/L — AB (ref 101–111)
CO2: 19 mmol/L — AB (ref 22–32)
Calcium: 7.7 mg/dL — ABNORMAL LOW (ref 8.9–10.3)
Creatinine, Ser: 2.9 mg/dL — ABNORMAL HIGH (ref 0.61–1.24)
GFR calc Af Amer: 24 mL/min — ABNORMAL LOW (ref >60)
GFR calc non Af Amer: 20 mL/min — ABNORMAL LOW (ref >60)
GLUCOSE: 86 mg/dL (ref 65–99)
POTASSIUM: 4.2 mmol/L (ref 3.5–5.1)
Phosphorus: 4 mg/dL (ref 2.5–4.6)
Sodium: 138 mmol/L (ref 135–145)

## 2014-12-11 LAB — GLUCOSE, CAPILLARY
GLUCOSE-CAPILLARY: 81 mg/dL (ref 65–99)
GLUCOSE-CAPILLARY: 88 mg/dL (ref 65–99)
GLUCOSE-CAPILLARY: 90 mg/dL (ref 65–99)
Glucose-Capillary: 138 mg/dL — ABNORMAL HIGH (ref 65–99)

## 2014-12-11 LAB — SURGICAL PCR SCREEN
MRSA, PCR: NEGATIVE
STAPHYLOCOCCUS AUREUS: NEGATIVE

## 2014-12-11 LAB — PROTIME-INR
INR: 1.67 — ABNORMAL HIGH (ref 0.00–1.49)
Prothrombin Time: 19.7 seconds — ABNORMAL HIGH (ref 11.6–15.2)

## 2014-12-11 LAB — HEMOGLOBIN A1C
HEMOGLOBIN A1C: 6.3 % — AB (ref 4.8–5.6)
Mean Plasma Glucose: 134 mg/dL

## 2014-12-11 LAB — CK: CK TOTAL: 68 U/L (ref 49–397)

## 2014-12-11 LAB — CBC
HEMATOCRIT: 26 % — AB (ref 39.0–52.0)
HEMOGLOBIN: 8.3 g/dL — AB (ref 13.0–17.0)
MCH: 23.1 pg — ABNORMAL LOW (ref 26.0–34.0)
MCHC: 31.9 g/dL (ref 30.0–36.0)
MCV: 72.4 fL — AB (ref 78.0–100.0)
Platelets: 134 10*3/uL — ABNORMAL LOW (ref 150–400)
RBC: 3.59 MIL/uL — ABNORMAL LOW (ref 4.22–5.81)
RDW: 15.3 % (ref 11.5–15.5)
WBC: 6 10*3/uL (ref 4.0–10.5)

## 2014-12-11 LAB — HEPATIC FUNCTION PANEL
ALK PHOS: 63 U/L (ref 38–126)
ALT: 20 U/L (ref 17–63)
AST: 28 U/L (ref 15–41)
Albumin: 2.4 g/dL — ABNORMAL LOW (ref 3.5–5.0)
BILIRUBIN DIRECT: 0.1 mg/dL (ref 0.1–0.5)
BILIRUBIN INDIRECT: 0.4 mg/dL (ref 0.3–0.9)
Total Bilirubin: 0.5 mg/dL (ref 0.3–1.2)
Total Protein: 6.1 g/dL — ABNORMAL LOW (ref 6.5–8.1)

## 2014-12-11 LAB — MAGNESIUM: Magnesium: 1.8 mg/dL (ref 1.7–2.4)

## 2014-12-11 LAB — APTT: aPTT: 42 seconds — ABNORMAL HIGH (ref 24–37)

## 2014-12-11 SURGERY — A-FLUTTER/A-TACH/SVT ABLATION
Anesthesia: LOCAL

## 2014-12-11 MED ORDER — PANTOPRAZOLE SODIUM 40 MG PO TBEC
40.0000 mg | DELAYED_RELEASE_TABLET | Freq: Every day | ORAL | Status: DC
  Administered 2014-12-11 – 2014-12-13 (×3): 40 mg via ORAL
  Filled 2014-12-11 (×3): qty 1

## 2014-12-11 MED ORDER — SODIUM CHLORIDE 0.9 % IV SOLN
INTRAVENOUS | Status: DC
  Administered 2014-12-11: 16:00:00 via INTRAVENOUS

## 2014-12-11 MED ORDER — DOCUSATE SODIUM 100 MG PO CAPS
100.0000 mg | ORAL_CAPSULE | Freq: Two times a day (BID) | ORAL | Status: DC
  Administered 2014-12-11 – 2014-12-13 (×5): 100 mg via ORAL
  Filled 2014-12-11 (×5): qty 1

## 2014-12-11 NOTE — Care Management Note (Signed)
Case Management Note  Patient Details  Name: KESSLER SOLLY MRN: [id number] Date of Birth: [birth year]  Subjective/Objective:                    Action/Plan:  Initial UR completed Expected Discharge Date:                  Expected Discharge Plan:  Home/Self Care  In-House Referral:     Discharge planning Services     Post Acute Care Choice:    Choice offered to:     DME Arranged:    DME Agency:     HH Arranged:    HH Agency:     Status of Service:  In process, will continue to follow  Medicare Important Message Given:    Date Medicare IM Given:    Medicare IM give by:    Date Additional Medicare IM Given:    Additional Medicare Important Message give by:     If discussed at Long Length of Stay Meetings, dates discussed:    Additional Comments:  Kingsley Plan, RN 12/11/2014, 8:49 AM

## 2014-12-11 NOTE — H&P (Signed)
Internal Medicine Attending Admission Note  I saw and evaluated the patient. I reviewed the resident's note and I agree with the resident's findings and plan as documented in the resident's note.  Assessment & Plan by Problem:  Principal Problem:   Cholelithiasis with acute and chronic cholecystitis without biliary obstruction Active Problems:   Type 2 diabetes mellitus with neurological manifestations   Essential hypertension   Constipation   Gastroesophageal reflux disease without esophagitis   Atrial flutter   Cholelithiasis with possible cholecystitis: Clinical condition seems most consistent with symptomatic cholelithiasis. Currently he has no right upper quadrant pain, denied any fevers, and does not have a leukocytosis. That ultrasound did demonstrate significant thickening of the gallbladder wall to 7 mm making acute cholecystitis also possible. We are coordinating with surgery if he should have cholecystectomy during this admission or treat with antibiotics and defer cholecystectomy to outpatient elective procedure. Currently treating with ceftriaxone which should have adequate coverage for mild-moderate cholecystitis.   CAD: Patient does have ischemic heart disease with a stent placed in 1998. Last stress test April 2015 was low risk. He's been compliant with secondary prevention. Difficult to assess his metabolic equivalents because of his disability due to above-knee amputation. He tolerated a hip replacement well a couple months ago. Cardiology consulted for risk modification. He remains intermittent risk for a moderate risk procedure, but if the cholecystectomy is urgent he should proceed.   Atrial Flutter: Long-standing issue. Rate currently controlled with amiodarone and carvedilol. At home he was anticoagulated with Apixaban, last dose 9/17 in the p.m. CHADS-VASC score of 4, giving a 4% annual stroke risk. He is low risk to hold anticoagulation now and several days following  after surgery.   Hepatitis C antibody positive: Found a few weeks ago in clinic based on age-appropriate antibody screen. Hepatitis C viral load is pending. We'll evaluate hepatitis A and B immunization status. No signs of cirrhosis on exam, ultrasound without significant fibrous changes or ascites. He will need elastography as an outpatient and follow up with ID hepatitis clinic.    Chief Complaint(s):  Nausea and vomiting  History - key components related to admission:  69 year old [name] [name] came to the emergency department last night with 2 days of progressive nausea and nonbloody and nonbilious emesis. He denied having any significant abdominal pain, but said that he was having abdominal discomfort. Denied any fevers or chills at home. No chest pain, shortness of breath, or palpitations. He has not had pain like this before. Denies any diarrhea.  Lab results: Reviewed in Epic  Physical Exam - key components related to admission:  Filed Vitals:   12/10/14 1615 12/10/14 1653 12/10/14 2059 12/11/14 0627  BP: 147/74 148/78 174/72 136/63  Pulse: 58 62 65 69  Temp:  97.4 F (36.3 C) 97.9 F (36.6 C) 99.2 F (37.3 C)  TempSrc:  Oral Oral Oral  Resp: Height:      Weight:  183 lb (83.008 kg)  188 lb (85.276 kg)  SpO2: 100% 100% 100% 96%    Gen: Well-appearing older [name] [name], lying in bed, no distress ENT: Moist mucous membranes, OP clear Neck: No thyromegaly no JVD CV: Regular rate and rhythm, no murmurs Lungs: Unlabored, clear to auscultation throughout Abd: Soft, nondistended, nontender, negative Murphy sign, normal bowel sounds Ext: Well-perfused, normal joints, no edema Skin: Warm, no rashes Neuro: Alert and oriented spontaneously, normally conversational, normal strength throughout

## 2014-12-11 NOTE — Consult Note (Signed)
CARDIOLOGY CONSULT NOTE   Patient ID: [name] [name] MRN: [id number] DOB/AGE: 08-20-1945 69 y.o.  Admit date: 12/10/2014  Primary Physician   Karren Cobble, MD Primary Cardiologist   Dr. Stanford Breed Reason for Consultation   Pre-op clearance.   HPI: [name] [name] is a 69 y.o. male with a history of CAD s/p DES to RCA (2006), HLD, HTN, DM, CKD, anemia, PAD s/p R AKA, obesity and recently diagnosed atrial flutter on amio and Eliquis (set up for ablation today) who presented to [facility] on 12/10/14 with n/v and found to have cholecystitis with cholelithiasis. Cardiology is consulted for pre op clearance prior to possible laparoscopic cholecystectomy.  Patient diagnosed with atrial flutter on 07/2014 admission. Echo 5/16 showed EF 50% with basal inferior akinesis, mild LAE, mild RAE/RVE. He was placed on amiodarone and Eliqus and converted.  He was seen on 10/26/14 by Dr. Lovena Le for atrial flutter ablation and this was set up for 12/11/14. Most recent stress test 06/2013 with EF 61% and normal perfusion.   Upon admission, patient reported 2-3 days of nausea that has been waking him up at night. He then began to have emesis and came to the ED for further evaluation. Symptoms are not associated with eating. He denies any fevers, chills, abdominal pain, constipation, diarrhea. He got a CT abdomen without contrast in the ED which was concerning for cholelithiasis with gallbladder wall thickening. RUQ ultrasound revealed a multiple mobile gallstone with the largest measuring 7 mm. Gallbladder wall thickened to 7 mm.  He denies and recent chest pain or SOB. No symptoms with exertion. He has a right AKA but is able to get around with his prosthesis and a walker. He is fairly active and is able to help with activities like mowing the lawn and vacuuming. He has no symptoms with these activities. He denies LE edema, orthopnea or PND.    Past Medical History  Diagnosis Date  . Hyperlipidemia LDL goal <  100 04/02/2006  . Essential hypertension 12/09/2006  . Benign prostatic hypertrophy with nocturia 07/07/2006  . Chronic venous insufficiency 04/12/2010  . Obesity (BMI 30.0-34.9) 01/15/2012  . Constipation 05/25/2009    Intermittent   . Microcytic normochromic anemia 05/27/2006  . Seasonal allergic rhinitis 05/25/2009  . Internal and external hemorrhoids without complication 4/88/8916  . Coronary artery disease 04/02/2006    s/p RCA DES 2006, low risk myoview in 2011  . 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 Type 2 diabetes mellitus with stage 3 chronic kidney disease   . Closed displaced fracture of left femoral neck 06/14/2014    s/p left hip hemiarthroplasty June 14, 2014   . Atrial flutter 07/26/2014    May 2016, CHA2DS2VASc = 4 -> Eliquis, spontaneous conversion to NSR   . Degenerative joint disease involving multiple joints 02/02/2007  . Gastroesophageal reflux disease   . Aortic atherosclerosis 11/30/2014    Seen on CT, currently asymptomatic     Past Surgical History  Procedure Laterality Date  . Pilonidal cyst excision  1990's  . Refractive surgery  Bilateral   . Fracture surgery Left     "hip/stump"  . Total hip arthroplasty Left 06/14/2014    Procedure: HEMI HIP ARTHROPLASTY ANTERIOR APPROACH;  Surgeon: Leandrew Koyanagi, MD;  Location: [place];  Service: Orthopedics;  Laterality: Left;  . Joint replacement      Left Hip  . Leg amputation above knee Right ~ 2008  . Coronary angioplasty with stent placement      Allergies  Allergen Reactions  . Amoxicillin Swelling and  Other (See Comments)    Puffy eyes and abdominal pain with Amoxicillin PO  . Lisinopril Other (See Comments)    Acute kidney injury  . Pravastatin Other (See Comments)    Weakness and fatigue  . Tamsulosin Other (See Comments)    REACTION: dry throat, sweating, blurred vision  . Doxycycline Rash and Other (See Comments)    Questionable drug rxn rash    I have reviewed the patient's current medications . amiodarone  200 mg Oral Daily  . carvedilol  12.5 mg Oral BID WC  . cefTRIAXone (ROCEPHIN)  IV  2 g Intravenous Q24H  . heparin  5,000 Units Subcutaneous 3 times per day  . insulin aspart  0-15 Units Subcutaneous TID WC  . pneumococcal 23 valent vaccine  0.5 mL Intramuscular Tomorrow-1000  . senna-docusate  1 tablet Oral QHS     fluticasone, hydroxypropyl methylcellulose / hypromellose, promethazine  Prior to Admission medications   Medication Sig Start Date End Date Taking? Authorizing   ACCU-CHEK AVIVA PLUS test strip 1 strip by Does not apply route 2 (two) times daily. 11/16/14  Yes Historical , MD  ACCU-CHEK SOFTCLIX LANCETS lancets 1 each by Other route 2 (two) times daily. 11/16/14  Yes Historical , MD  amiodarone (PACERONE) 200 MG tablet Take 1 tablet (200 mg total) by mouth daily. 08/31/14  Yes Oval Linsey, MD  amLODipine (NORVASC) 10 MG tablet Take 10 mg by mouth daily. 07/18/14  Yes Historical , MD  apixaban (ELIQUIS) 5 MG TABS tablet Take 1 tablet (5 mg total) by mouth 2 (two) times daily. 08/31/14  Yes Oval Linsey, MD  Blood Glucose Calibration (ACCU-CHEK AVIVA) SOLN 1 drop by Does not apply route as needed. 11/16/14  Yes Historical , MD  Blood Glucose Monitoring Suppl (ACCU-CHEK AVIVA PLUS) W/DEVICE KIT 1 cartridge by Does not apply route as needed. 11/16/14  Yes Historical , MD  calcium citrate-vitamin D (CITRACAL+D) 315-200 MG-UNIT per tablet Take 1 tablet by mouth daily.    Yes Historical , MD  carvedilol (COREG)  12.5 MG tablet Take 1 tablet (12.5 mg total) by mouth 2 (two) times daily with a meal. 01/05/14  Yes Oval Linsey, MD  docusate sodium (COLACE) 100 MG capsule Take 100 mg by mouth 2 (two) times daily as needed (constipation).    Yes Historical , MD  famotidine (PEPCID) 20 MG tablet Take 1 tablet (20 mg total) by mouth 2 (two) times daily. Patient taking differently: Take 20 mg by mouth 2 (two) times daily as needed for heartburn.  07/28/14  Yes Lucious Groves, DO  fish oil-omega-3 fatty acids 1000 MG capsule Take 1 g by mouth daily. 01/15/12  Yes Oval Linsey, MD  fluticasone [facility]) 50 MCG/ACT nasal spray Place 2 sprays into both nostrils daily as needed for allergies. 12/29/13  Yes Bartholomew Crews, MD  furosemide (LASIX) 40 MG tablet Take 40 mg by mouth daily as needed for fluid. Take 1 tablet by mouth daily as needed for a  weight gain of more than 5 lbs in a week.   Yes Historical , MD  hydroxypropyl methylcellulose (ISOPTO TEARS) 2.5 % ophthalmic solution Place 2 drops into both eyes 4 (four) times daily as needed for dry eyes.   Yes Historical , MD  insulin aspart protamine - aspart (NOVOLOG MIX 70/30 FLEXPEN) (70-30) 100 UNIT/ML FlexPen Inject 0.1 mLs (10 Units total) into the skin 2 (two) times daily with a meal. Patient taking differently: Inject 5-15 Units into the skin 2 (two) times daily with a meal. 120-130 = 0 units, ~250 = 5 units, 260-300 = 10 units, >300 = 15 units 11/22/14  Yes Oval Linsey, MD  Insulin Pen Needle (NOVOFINE) 30G X 8 MM MISC Inject SQ as directed twice daily, use new pen needle for each injection 09/10/10  Yes Acquanetta Chain, DO  Multiple Vitamins-Minerals (MULTIVITAMIN WITH MINERALS) tablet Take 1 tablet by mouth daily.   Yes Historical , MD  pravastatin (PRAVACHOL) 40 MG tablet Take 1 tablet (40 mg total) by mouth every evening. 10/13/14  Yes Lelon Perla, MD  terazosin (HYTRIN) 10 MG capsule Take 1 capsule (10 mg total)  by mouth at bedtime. 10/20/14  Yes Oval Linsey, MD     Social History   Social History  . Marital Status: Married    Spouse Name: N/A  . Number of Children: N/A  . Years of Education: N/A   Occupational History  . Not on file.   Social History Main Topics  . Smoking status: Former Smoker -- 1.00 packs/day for 10 years    Types: Cigarettes    Quit date: 06/16/1968  . Smokeless tobacco: Never Used  . Alcohol Use: Yes     Comment: "quit alcohol in the 1960's"  . Drug Use: No  . Sexual Activity: Yes    Birth Control/ Protection: None   Other Topics Concern  . Not on file   Social History Narrative    Family Status  Relation Status Death Age  . Mother Deceased   . Father Deceased   . Sister Alive   . Brother Deceased   . Daughter Deceased   . Brother Deceased   . Brother Alive   . Sister Alive   . Daughter Alive   . Daughter Alive   . Daughter Alive   . Son Alive   . Sister Alive   . Daughter Alive    Family History  Problem Relation Age of Onset  . Hypertension Mother   . Heart attack Father   . Breast cancer Sister   . Arthritis Sister   . Heart attack Brother   . Diabetes Brother   . Stroke Brother   . Pneumonia Daughter   . Diabetes Brother   . Alcoholism Brother   . Diabetes Brother   . Arthritis Sister     Bilateral knee replacement  . HIV Daughter   . Hypertension Daughter   . Drug abuse Daughter   . Schizophrenia Daughter   . Hypertension Daughter   . Drug abuse Daughter   . Bipolar disorder Daughter      ROS:  Full 14 point review of systems complete and found to be negative unless listed above.  Physical Exam: Blood pressure 136/63, pulse 69, temperature 99.2 F (37.3 C), temperature source Oral, resp. rate 16, height 5' 8" (1.727 m), weight 188 lb (85.276 kg), SpO2 96 %.  General: Well developed, well nourished, male in no acute distress Head: Eyes PERRLA, No xanthomas.  Normocephalic and atraumatic, oropharynx without edema or  exudate.   Lungs: CTAB Heart: HRRR S1 S2, no rub/gallop, Heart reg rate and rhythm with S1, S2  murmur. pulses are 2+ extrem.   Neck: No carotid bruits. No lymphadenopathy.  NoJVD. Abdomen: Bowel sounds present, abdomen soft and non-tender without masses or hernias noted. Msk:  No spine or cva tenderness. No weakness, no joint deformities or effusions. Extremities: No clubbing or cyanosis. Trace LLE edema, Right s/p AKA.  Neuro: Alert and oriented X 3. No focal deficits noted. Psych:  Good affect, responds appropriately Skin: No rashes or lesions noted.  Labs:   Lab Results  Component Value Date   WBC 6.0 12/11/2014   HGB 8.3* 12/11/2014   HCT 26.0* 12/11/2014   MCV 72.4* 12/11/2014   PLT 134* 12/11/2014    Recent Labs  12/11/14 0430  INR 1.67*    Recent Labs Lab 12/11/14 0430  NA 138  K 4.2  CL 113*  CO2 19*  BUN 42*  CREATININE 2.90*  CALCIUM 7.7*  PROT 6.1*  BILITOT 0.5  ALKPHOS 63  ALT 20  AST 28  GLUCOSE 86  ALBUMIN 2.4*  2.4*   MAGNESIUM  Date Value Ref Range Status  12/11/2014 1.8 1.7 - 2.4 mg/dL Final    Recent Labs  12/11/14 0430  CKTOTAL 68    Lab Results  Component Value Date   CHOL 132 12/11/2014   HDL 23* 12/11/2014   LDLCALC 72 12/11/2014   TRIG 187* 12/11/2014   Lab Results  Component Value Date   DDIMER 0.65* 07/25/2014   LIPASE  Date/Time Value Ref Range Status  12/10/2014 10:52 AM 18* 22 - 51 U/L Final   TSH  Date/Time Value Ref Range Status  07/26/2014 02:54 PM 3.200 0.350 - 4.500 uIU/mL Final   VITAMIN B-12  Date/Time Value Ref Range Status  06/22/2014 06:00 AM 814 211 - 911 pg/mL Final    Comment:    Performed at [facility]  Date/Time Value Ref Range Status  06/22/2014 06:00 AM 15.1 ng/mL Final    Comment:    (NOTE) Reference Ranges        Deficient:       0.4 - 3.3 ng/mL        Indeterminate:   3.4 - 5.4 ng/mL        Normal:              > 5.4 ng/mL Performed at [facility]  Date/Time Value Ref Range Status  07/26/2014 05:54 AM 175 24 - 336 ng/mL Final   TIBC  Date/Time Value Ref Range Status  06/22/2014 06:00 AM 200* 215 - 435 ug/dL Final   TOTAL IRON BINDING CAPACITY  Date/Time Value Ref Range Status  10/26/2014 01:56 PM 246* 250 - 450 ug/dL Final   IRON  Date/Time Value Ref Range Status  10/26/2014 01:56 PM 38 38 - 169 ug/dL Final  06/22/2014 06:00 AM 10* 42 - 165 ug/dL Final   RETIC CT PCT  Date/Time Value Ref Range Status  06/22/2014 06:00 AM 1.4 0.4 - 3.1 % Final    Echo: Study Date: 07/27/2014 LV EF: 50% Study Conclusions - Left ventricle: The cavity size was normal. There was mild focal basal hypertrophy of the septum. The estimated ejection fraction was 50%. Basal inferior akinesis. Inferolateral hypokinesis. Indeterminant diastolic function (atrial flutter). - Aortic valve: Trileaflet; moderately calcified leaflets. There was no stenosis. - Mitral  valve: Mildly calcified annulus. Normal thickness leaflets . There was trivial regurgitation. - Left atrium: The atrium was mildly dilated. - Right ventricle: The cavity size was mildly dilated. Systolic function was mildly reduced. - Right atrium: The atrium was mildly dilated. - Pulmonary arteries: No complete TR doppler jet so unable to estimate PA systolic pressure. - Systemic veins: IVC measured 2.3 cm with < 50% respirophasic variation, suggesting RA pressure 15 mmHg. Impressions: - The patient was in atrial flutter. Normal LV size with mild focal basal septal hypertrophy. EF 50%. Basal inferior akinesis and inferolateral hypokinesis. Mildly dilated RV with mildly decreased systolic function. Dilated IVC.   ECG:  HR 60, RBBB, LAFB, junctional rhythm   Radiology:  Ct Abdomen Pelvis Wo Contrast  12/10/2014   CLINICAL DATA:  Nausea and vomiting intermittent that comes on after eating and when laying flat. Pt states hx of acid reflux  EXAM: CT  ABDOMEN AND PELVIS WITHOUT CONTRAST  TECHNIQUE: Multidetector CT imaging of the abdomen and pelvis was performed following the standard protocol without IV contrast.  COMPARISON:  06/10/2010  FINDINGS: Lower chest:  Bilateral lower lobe mild dependent atelectasis  Hepatobiliary: Liver normal without contrast. Innumerable small gallstones. Possible mild gallbladder wall thickening  Pancreas: Normal  Spleen: Normal  Adrenals/Urinary Tract: Progressive significant diffuse bladder wall thickening more so along the left side. Mild left hydroureteronephrosis. Mild progressive perinephric inflammation when compared to prior study. 1 cm low-attenuation area upper pole left kidney possibly a cyst. Right kidney also shows mild perinephric inflammation and mild prominence of the ureter, similar but slightly less prominent when compared to the contralateral side.  Stomach/Bowel: Stool throughout the colon and into the rectum  Vascular/Lymphatic: Extensive aortoiliac atherosclerotic calcification  Reproductive: Prostatic fossa largely obscured by beam attenuation artifact from left hip replacement  Other: No free fluid  Musculoskeletal: No acute findings. Chronic compression deformity L3 and L4.  IMPRESSION: 1. Cholelithiasis. Possible mild gallbladder wall thickening. Correlate clinically and consider right upper quadrant ultrasound if indicated. 2. Progressive bladder wall thickening suggesting hypertrophy from outlet obstruction, although cystitis or infiltrating mass not entirely excluded. This appears to be U related to bilateral mild ureteral dilatation which could reflect an element of obstruction or reflux. There is mild progressive perinephric inflammation. Urinary tract infection is not excluded.   Electronically Signed   By: Skipper Cliche M.D.   On: 12/10/2014 12:46   US Abdomen Limited Ruq  12/10/2014   CLINICAL DATA:  Right upper quadrant pain for 1 week  EXAM: US ABDOMEN LIMITED - RIGHT UPPER QUADRANT   COMPARISON:  None.  FINDINGS: Gallbladder:  Multiple mobile gallstones the largest measuring 7 mm. No Murphy's sign. Wall of the gallbladder is thickened to 7 mm.  Common bile duct:  Diameter: 2 mm  Liver:  No focal lesion identified. Within normal limits in parenchymal echogenicity.  Incidental note of right renal cysts.  IMPRESSION: Cholelithiasis and gallbladder wall thickening concerning for the possibility of cholecystitis, although there is no sonographic Murphy's sign.   Electronically Signed   By: Skipper Cliche M.D.   On: 12/10/2014 14:01    ASSESSMENT AND PLAN:    Active Problems:   Type 2 diabetes mellitus with neurological manifestations   Essential hypertension   Constipation   Gastroesophageal reflux disease without esophagitis   Atrial flutter   Cholelithiasis   Symptomatic cholelithiasis  [name] [name] is a 69 y.o. male with a history of CAD s/p DES to RCA (2006), HLD, HTN, DM, CKD,  anemia, PAD s/p R AKA, obesity and recently diagnosed atrial flutter on amio and Eliquis (set up for ablation today) who presented to [facility] on 12/10/14 with n/v and found to have cholecystitis with cholelithiasis. Cardiology is consulted for pre op clearance prior to possible laparoscopic cholecystectomy.  Pre-operative clearance for possible laparoscopic cholecystectomy -- Patient has no ischemic sx or s/s CHF. He is able to perform activities ~ 4METS with no problems. He had a recent 2D ECHO (07/2014) with normal LV function ~50%. Last nuclear stress test was normal in 06/2013. Please see MD note for final recommendation.  Paroxysmal atrial flutter-  diagnosed with atrial flutter on 07/2014 admission. -- ECG on admission with junctional rhythm HR 60s.  -- CHADSVASC score at least 4 (HTN 1, Age 50, DM 1, vasc dz 1). -- Echo 5/16 showed EF 50% with basal inferior akinesis, mild LAE, mild RAE/RVE.  -- He was placed on amiodarone and Eliqus and converted.  He was seen on 10/26/14 by Dr. Lovena Le for  discussion about atrial flutter ablation and this was set up for 12/11/14 to avoid need for long term AC and amio. Will have flutter ablation rescheduled.  -- Continue amio 246m qd for now. Eliquis held for possible cholecystectomy.   CAD s/p DES to RCA (2006)  -- Low risk nuclear stress test 06/2013 -- No recent chest pain/sob. No recent hx of exertional CP or DOE. -- No ASA as he is on Eliquis. Cont statin and BB.  HTN- well controlled currently.   Anemia of chronic disease- H/H 8.3/26. A little below his baseline   Acute on CKD- creat improving. 2.9 today, still elevated from baseline ~2    Signed: TEileen Stanford PA-C 12/11/2014 10:26 AM  Pager [phone] Co-Sign MD  As above, patient seen and examined. Briefly he is a 69year old male with past medical history of coronary artery disease, chronic renal insufficiency, hypertension for preoperative evaluation prior to cholecystectomy. His most recent nuclear study in April 2015 showed ejection fraction 61% and normal perfusion. Echocardiogram May 2016 showed ejection fraction 50%. Recently diagnosed with atrial flutter and placed on anticoagulation. Seen by Dr. TLovena Leand atrial flutter ablation planned.  However patient developed 3 days of nausea and has been diagnosed with cholecystitis. He is scheduled to have cholecystectomy and cardiology asked to evaluate. He has not had dyspnea or exertional chest pain. Electrocardiogram showed sinus rhythm, first-degree AV block, right bundle branch block. Given that patient has not had any cardiac symptoms and recent nuclear study normal he does not require further cardiac testing preoperatively. He is in sinus rhythm. Continue amiodarone and carvedilol pre-and postoperatively. Apixaban on hold for procedure. Resume postoperatively when okay with surgery. His atrial flutter ablation can be rescheduled in the future after he recovers from his cholecystectomy. BKirk Ruths

## 2014-12-11 NOTE — Progress Notes (Signed)
Patient ID: [name] [name], male   DOB: March 02, 1946, 69 y.o.   MRN: [id number]    Subjective: Pt has never had pain, still doesn't.  Having nausea is his biggest complaint.  Objective: Vital signs in last 24 hours: Temp:  [97.4 F (36.3 C)-99.2 F (37.3 C)] 99.2 F (37.3 C) (09/19 0627) Pulse Rate:  [58-69] 69 (09/19 0627) Resp:  [10-23] 16 (09/19 0627) BP: (132-174)/(63-79) 136/63 mmHg (09/19 0627) SpO2:  [96 %-100 %] 96 % (09/19 0627) Weight:  [83.008 kg (183 lb)-87.091 kg (192 lb)] 85.276 kg (188 lb) (09/19 0627) Last BM Date: 12/08/14  Intake/Output from previous day: 09/18 0701 - 09/19 0700 In: -  Out: 400 [Urine:400] Intake/Output this shift:    PE: Abd: soft, NT, Nd, +BS Heart: regular right now Lungs: CTAB  Lab Results:   Recent Labs  12/10/14 1052 12/11/14 0430  WBC 5.8 6.0  HGB 9.2* 8.3*  HCT 28.7* 26.0*  PLT 166 134*   BMET  Recent Labs  12/10/14 1052 12/11/14 0430  NA 137 138  K 5.5* 4.2  CL 112* 113*  CO2 19* 19*  GLUCOSE 158* 86  BUN 54* 42*  CREATININE 3.31* 2.90*  CALCIUM 7.9* 7.7*   PT/INR  Recent Labs  12/11/14 0430  LABPROT 19.7*  INR 1.67*   CMP     Component Value Date/Time   NA 138 12/11/2014 0430   NA 140 11/22/2014 1029   K 4.2 12/11/2014 0430   CL 113* 12/11/2014 0430   CO2 19* 12/11/2014 0430   GLUCOSE 86 12/11/2014 0430   GLUCOSE 178* 11/22/2014 1029   BUN 42* 12/11/2014 0430   BUN 50* 11/22/2014 1029   CREATININE 2.90* 12/11/2014 0430   CREATININE 1.69* 08/22/2014 1439   CALCIUM 7.7* 12/11/2014 0430   PROT 6.1* 12/11/2014 0430   ALBUMIN 2.4* 12/11/2014 0430   ALBUMIN 2.4* 12/11/2014 0430   AST 28 12/11/2014 0430   ALT 20 12/11/2014 0430   ALKPHOS 63 12/11/2014 0430   BILITOT 0.5 12/11/2014 0430   GFRNONAA 20* 12/11/2014 0430   GFRNONAA 40* 08/22/2014 1439   GFRAA 24* 12/11/2014 0430   GFRAA 46* 08/22/2014 1439   Lipase     Component Value Date/Time   LIPASE 18* 12/10/2014 1052        Studies/Results: Ct Abdomen Pelvis Wo Contrast  12/10/2014   CLINICAL DATA:  Nausea and vomiting intermittent that comes on after eating and when laying flat. Pt states hx of acid reflux  EXAM: CT ABDOMEN AND PELVIS WITHOUT CONTRAST  TECHNIQUE: Multidetector CT imaging of the abdomen and pelvis was performed following the standard protocol without IV contrast.  COMPARISON:  06/10/2010  FINDINGS: Lower chest:  Bilateral lower lobe mild dependent atelectasis  Hepatobiliary: Liver normal without contrast. Innumerable small gallstones. Possible mild gallbladder wall thickening  Pancreas: Normal  Spleen: Normal  Adrenals/Urinary Tract: Progressive significant diffuse bladder wall thickening more so along the left side. Mild left hydroureteronephrosis. Mild progressive perinephric inflammation when compared to prior study. 1 cm low-attenuation area upper pole left kidney possibly a cyst. Right kidney also shows mild perinephric inflammation and mild prominence of the ureter, similar but slightly less prominent when compared to the contralateral side.  Stomach/Bowel: Stool throughout the colon and into the rectum  Vascular/Lymphatic: Extensive aortoiliac atherosclerotic calcification  Reproductive: Prostatic fossa largely obscured by beam attenuation artifact from left hip replacement  Other: No free fluid  Musculoskeletal: No acute findings. Chronic compression deformity L3 and L4.  IMPRESSION: 1.  Cholelithiasis. Possible mild gallbladder wall thickening. Correlate clinically and consider right upper quadrant ultrasound if indicated. 2. Progressive bladder wall thickening suggesting hypertrophy from outlet obstruction, although cystitis or infiltrating mass not entirely excluded. This appears to be U related to bilateral mild ureteral dilatation which could reflect an element of obstruction or reflux. There is mild progressive perinephric inflammation. Urinary tract infection is not excluded.    Electronically Signed   By: Esperanza Heir M.D.   On: 12/10/2014 12:46   US Abdomen Limited Ruq  12/10/2014   CLINICAL DATA:  Right upper quadrant pain for 1 week  EXAM: US ABDOMEN LIMITED - RIGHT UPPER QUADRANT  COMPARISON:  None.  FINDINGS: Gallbladder:  Multiple mobile gallstones the largest measuring 7 mm. No Murphy's sign. Wall of the gallbladder is thickened to 7 mm.  Common bile duct:  Diameter: 2 mm  Liver:  No focal lesion identified. Within normal limits in parenchymal echogenicity.  Incidental note of right renal cysts.  IMPRESSION: Cholelithiasis and gallbladder wall thickening concerning for the possibility of cholecystitis, although there is no sonographic Murphy's sign.   Electronically Signed   By: Esperanza Heir M.D.   On: 12/10/2014 14:01    Anti-infectives: Anti-infectives    Start     Dose/Rate Route Frequency Ordered Stop   12/11/14 1600  cefTRIAXone (ROCEPHIN) 2 g in dextrose 5 % 50 mL IVPB     2 g 100 mL/hr over 30 Minutes Intravenous Every 24 hours 12/10/14 1953     12/10/14 1900  piperacillin-tazobactam (ZOSYN) IVPB 2.25 g  Status:  Discontinued     2.25 g 100 mL/hr over 30 Minutes Intravenous 4 times per day 12/10/14 1753 12/10/14 1809   12/10/14 1830  cefTRIAXone (ROCEPHIN) 2 g in dextrose 5 % 50 mL IVPB     2 g 100 mL/hr over 30 Minutes Intravenous  Once 12/10/14 1809 12/10/14 1922   12/10/14 1630  piperacillin-tazobactam (ZOSYN) IVPB 3.375 g  Status:  Discontinued     3.375 g 100 mL/hr over 30 Minutes Intravenous  Once 12/10/14 1624 12/10/14 1809       Assessment/Plan  1. Cholecystitis with cholelithiasis -his labs are normal and he is not having pain, but his US shows wall thickness of 7mm.  He needs cardiology to see him and make recommendations for clearance.  If he is cleared, we can proceed this admission, but if he is not then we can always try to se this up as an outpatient. -cont NPO for now incase cards needs to do something. -will follow   LOS: 1  day    Galileah Piggee E 12/11/2014, 8:46 AM Pager: [phone]

## 2014-12-11 NOTE — Progress Notes (Signed)
Subjective: [name] continues to have some nausea today but no further episodes of emesis. Denies any abdominal pain.   Objective: Vital signs in last 24 hours: Filed Vitals:   12/10/14 1653 12/10/14 2059 12/11/14 0627 12/11/14 1300  BP: 148/78 174/72 136/63 147/72  Pulse: 62 65 69 59  Temp: 97.4 F (36.3 C) 97.9 F (36.6 C) 99.2 F (37.3 C) 98.4 F (36.9 C)  TempSrc: Oral Oral Oral Oral  Resp: Height:      Weight: 183 lb (83.008 kg)  188 lb (85.276 kg)   SpO2: 100% 100% 96% 99%   Weight change:   Intake/Output Summary (Last 24 hours) at 12/11/14 1635 Last data filed at 12/11/14 1337  Gross per 24 hour  Intake    250 ml  Output   1500 ml  Net  -1250 ml   Physical Exam Gen: Well developed, well nourished male lying in bed, in no distress CV: Regular rate and rhythm, no murmurs Lungs: clear to auscultation bilaterally, no wheezes or crackles Abd: Soft, nondistended, nontender, negative Murphy sign, normal bowel sounds Ext: Well-perfused, normal joints, no edema Skin: Warm, no rashes Neuro: Alert and oriented x3   Medications: I have reviewed the patient's current medications. Scheduled Meds: . amiodarone  200 mg Oral Daily  . carvedilol  12.5 mg Oral BID WC  . cefTRIAXone (ROCEPHIN)  IV  2 g Intravenous Q24H  . docusate sodium  100 mg Oral BID  . heparin  5,000 Units Subcutaneous 3 times per day  . insulin aspart  0-15 Units Subcutaneous TID WC  . pantoprazole  40 mg Oral Daily  . pneumococcal 23 valent vaccine  0.5 mL Intramuscular Tomorrow-1000   Continuous Infusions: . 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 Recent 2D ECHO in May 2016 with EF 50%. Nuclear stress test in April 2015 with normal perfusion. Cardiology cleared him for surgery. Plan for elective lap chole tomorrow. Will hold NPO at midnight. Not having any abdominal pain. Does continue to have nausea.  - Continue Ceftriaxone 2g IV qdaily  - Phenergan 12.5 mg IV q6hr prn - Holding Apixaban  CAD: s/p DES to RCA in 2006. No chest pain or SOB. Cleared by cardiology for surgery. - Coninue statin and BB  Atrial Flutter: Had ablation scheduled for today. Will have to reschedule to follow up outpatient. Anticoagulated with Apixipan at home, holding in anticipation of surgery. CHADS-VASC score of 4. Rate controlled on amiodarone and carvedilol.  - Amiodarone 200 mg daily and carvediolol 12.5 mg bid  DM type 2: CBGs well controlled. Hgb A1c 6.3. - Continue SSI - CBGs qac and qhs  HTN: BP stable. Continue Coreg.   HCV Ab positive: Hepatic function panel wnl.  - HCV RNA quant pending - Will need [place] with elastography as an outpatient and follow up with [facility] hepatitis clinic - Will check Hep A and B immune status  DVT PPx: Heparin  Dispo: Disposition is deferred at this time, awaiting improvement of current medical problems.  Anticipated discharge in approximately 2-3 day(s).   The patient does have a current PCP Doneen Poisson, MD)  and does need an [place] hospital follow-up appointment after discharge.  The patient does not have transportation limitations that hinder transportation to clinic appointments.  .Services Needed at time of discharge: Y = Yes, Blank = No PT:   OT:   RN:   Equipment:   Other:     LOS: 1 day   Valentino Nose, MD [facility] PGY-1 [phone] 12/11/2014, 4:35 PM

## 2014-12-12 ENCOUNTER — Inpatient Hospital Stay (HOSPITAL_COMMUNITY): Payer: Medicare PPO | Admitting: Anesthesiology

## 2014-12-12 ENCOUNTER — Encounter (HOSPITAL_COMMUNITY)
Admission: EM | Disposition: A | Discharge status: Home / Self Care | Payer: Self-pay | Source: Home / Self Care | Attending: Student in an Organized Health Care Education/Training Program

## 2014-12-12 ENCOUNTER — Inpatient Hospital Stay (HOSPITAL_COMMUNITY): Payer: Medicare PPO

## 2014-12-12 DIAGNOSIS — R946 Abnormal results of thyroid function studies: Secondary | ICD-10-CM

## 2014-12-12 DIAGNOSIS — E039 Hypothyroidism, unspecified: Secondary | ICD-10-CM | POA: Diagnosis present

## 2014-12-12 DIAGNOSIS — I10 Essential (primary) hypertension: Secondary | ICD-10-CM

## 2014-12-12 DIAGNOSIS — K8000 Calculus of gallbladder with acute cholecystitis without obstruction: Secondary | ICD-10-CM

## 2014-12-12 DIAGNOSIS — E119 Type 2 diabetes mellitus without complications: Secondary | ICD-10-CM

## 2014-12-12 DIAGNOSIS — Z9049 Acquired absence of other specified parts of digestive tract: Secondary | ICD-10-CM

## 2014-12-12 HISTORY — PX: CHOLECYSTECTOMY: SHX55

## 2014-12-12 LAB — COMPREHENSIVE METABOLIC PANEL
ALBUMIN: 2.4 g/dL — AB (ref 3.5–5.0)
ALT: 16 U/L — AB (ref 17–63)
AST: 27 U/L (ref 15–41)
Alkaline Phosphatase: 65 U/L (ref 38–126)
Anion gap: 7 (ref 5–15)
BUN: 41 mg/dL — AB (ref 6–20)
CHLORIDE: 117 mmol/L — AB (ref 101–111)
CO2: 17 mmol/L — AB (ref 22–32)
CREATININE: 2.8 mg/dL — AB (ref 0.61–1.24)
Calcium: 7.9 mg/dL — ABNORMAL LOW (ref 8.9–10.3)
GFR calc Af Amer: 25 mL/min — ABNORMAL LOW (ref >60)
GFR, EST NON AFRICAN AMERICAN: 21 mL/min — AB (ref >60)
Glucose, Bld: 86 mg/dL (ref 65–99)
POTASSIUM: 5 mmol/L (ref 3.5–5.1)
SODIUM: 141 mmol/L (ref 135–145)
Total Bilirubin: 0.3 mg/dL (ref 0.3–1.2)
Total Protein: 6.1 g/dL — ABNORMAL LOW (ref 6.5–8.1)

## 2014-12-12 LAB — HEPATITIS B CORE ANTIBODY, TOTAL: HEP B C TOTAL AB: POSITIVE — AB

## 2014-12-12 LAB — APTT: APTT: 39 s — AB (ref 24–37)

## 2014-12-12 LAB — CBC
HCT: 26.5 % — ABNORMAL LOW (ref 39.0–52.0)
Hemoglobin: 8.5 g/dL — ABNORMAL LOW (ref 13.0–17.0)
MCH: 23.4 pg — ABNORMAL LOW (ref 26.0–34.0)
MCHC: 32.1 g/dL (ref 30.0–36.0)
MCV: 72.8 fL — AB (ref 78.0–100.0)
PLATELETS: 126 10*3/uL — AB (ref 150–400)
RBC: 3.64 MIL/uL — AB (ref 4.22–5.81)
RDW: 15.5 % (ref 11.5–15.5)
WBC: 7 10*3/uL (ref 4.0–10.5)

## 2014-12-12 LAB — HCV RNA QUANT
HCV QUANT LOG: 6.674 {Log_IU}/mL (ref >1.70)
HCV Quantitative: 4720000 IU/mL (ref >50)

## 2014-12-12 LAB — GLUCOSE, CAPILLARY
GLUCOSE-CAPILLARY: 89 mg/dL (ref 65–99)
GLUCOSE-CAPILLARY: 139 mg/dL — AB (ref 65–99)
Glucose-Capillary: 112 mg/dL — ABNORMAL HIGH (ref 65–99)
Glucose-Capillary: 143 mg/dL — ABNORMAL HIGH (ref 65–99)
Glucose-Capillary: 69 mg/dL (ref 65–99)

## 2014-12-12 LAB — PROTIME-INR
INR: 1.39 (ref 0.00–1.49)
Prothrombin Time: 17.2 seconds — ABNORMAL HIGH (ref 11.6–15.2)

## 2014-12-12 LAB — TSH: TSH: 15.115 u[IU]/mL — AB (ref 0.350–4.500)

## 2014-12-12 LAB — VITAMIN D 25 HYDROXY (VIT D DEFICIENCY, FRACTURES): VIT D 25 HYDROXY: 31.4 ng/mL (ref 30.0–100.0)

## 2014-12-12 LAB — HEPATITIS A ANTIBODY, TOTAL: HEP A TOTAL AB: NEGATIVE

## 2014-12-12 LAB — HEPATITIS B SURFACE ANTIGEN: Hepatitis B Surface Ag: NEGATIVE

## 2014-12-12 LAB — T4, FREE: FREE T4: 0.75 ng/dL (ref 0.61–1.12)

## 2014-12-12 LAB — PARATHYROID HORMONE, INTACT (NO CA): PTH: 107 pg/mL — AB (ref 15–65)

## 2014-12-12 LAB — HEPATITIS B SURFACE ANTIBODY,QUALITATIVE: Hep B S Ab: REACTIVE

## 2014-12-12 SURGERY — LAPAROSCOPIC CHOLECYSTECTOMY WITH INTRAOPERATIVE CHOLANGIOGRAM
Anesthesia: General | Site: Abdomen

## 2014-12-12 MED ORDER — SODIUM CHLORIDE 0.9 % IR SOLN
Status: DC | PRN
  Administered 2014-12-12: 1000 mL

## 2014-12-12 MED ORDER — FENTANYL CITRATE (PF) 250 MCG/5ML IJ SOLN
INTRAMUSCULAR | Status: AC
  Filled 2014-12-12: qty 5

## 2014-12-12 MED ORDER — SUGAMMADEX SODIUM 200 MG/2ML IV SOLN
INTRAVENOUS | Status: AC
  Filled 2014-12-12: qty 2

## 2014-12-12 MED ORDER — MIDAZOLAM HCL 2 MG/2ML IJ SOLN
INTRAMUSCULAR | Status: AC
  Filled 2014-12-12: qty 4

## 2014-12-12 MED ORDER — PROPOFOL 10 MG/ML IV BOLUS
INTRAVENOUS | Status: AC
  Filled 2014-12-12: qty 20

## 2014-12-12 MED ORDER — ONDANSETRON HCL 4 MG/2ML IJ SOLN
4.0000 mg | Freq: Four times a day (QID) | INTRAMUSCULAR | Status: DC | PRN
  Administered 2014-12-13: 4 mg via INTRAVENOUS
  Filled 2014-12-12: qty 2

## 2014-12-12 MED ORDER — MIDAZOLAM HCL 5 MG/5ML IJ SOLN
INTRAMUSCULAR | Status: DC | PRN
  Administered 2014-12-12: 1 mg via INTRAVENOUS

## 2014-12-12 MED ORDER — SODIUM CHLORIDE 0.9 % IV SOLN
INTRAVENOUS | Status: DC
  Administered 2014-12-12: 09:00:00 via INTRAVENOUS

## 2014-12-12 MED ORDER — DEXTROSE 5 % IV SOLN
2.0000 g | INTRAVENOUS | Status: DC
  Filled 2014-12-12: qty 2

## 2014-12-12 MED ORDER — LIDOCAINE HCL (CARDIAC) 20 MG/ML IV SOLN
INTRAVENOUS | Status: DC | PRN
  Administered 2014-12-12: 100 mg via INTRAVENOUS

## 2014-12-12 MED ORDER — ONDANSETRON HCL 4 MG/2ML IJ SOLN
INTRAMUSCULAR | Status: DC | PRN
  Administered 2014-12-12: 4 mg via INTRAVENOUS

## 2014-12-12 MED ORDER — BUPIVACAINE-EPINEPHRINE 0.25% -1:200000 IJ SOLN
INTRAMUSCULAR | Status: DC | PRN
  Administered 2014-12-12: 23 mL

## 2014-12-12 MED ORDER — SIMETHICONE 80 MG PO CHEW: 40.0000 mg | CHEWABLE_TABLET | Freq: Four times a day (QID) | ORAL | Status: DC | PRN

## 2014-12-12 MED ORDER — HEPARIN SODIUM (PORCINE) 5000 UNIT/ML IJ SOLN
5000.0000 [IU] | Freq: Three times a day (TID) | INTRAMUSCULAR | Status: DC
  Administered 2014-12-12 – 2014-12-13 (×3): 5000 [IU] via SUBCUTANEOUS
  Filled 2014-12-12 (×3): qty 1

## 2014-12-12 MED ORDER — FENTANYL CITRATE (PF) 250 MCG/5ML IJ SOLN
INTRAMUSCULAR | Status: DC | PRN
  Administered 2014-12-12: 100 ug via INTRAVENOUS

## 2014-12-12 MED ORDER — MORPHINE SULFATE (PF) 2 MG/ML IV SOLN
1.0000 mg | INTRAVENOUS | Status: DC | PRN
Start: 2014-12-12 — End: 2014-12-13

## 2014-12-12 MED ORDER — PROPOFOL 10 MG/ML IV BOLUS
INTRAVENOUS | Status: DC | PRN
  Administered 2014-12-12: 150 mg via INTRAVENOUS

## 2014-12-12 MED ORDER — SODIUM CHLORIDE 0.9 % IV SOLN
INTRAVENOUS | Status: DC | PRN
  Administered 2014-12-12: 8 mL

## 2014-12-12 MED ORDER — SUGAMMADEX SODIUM 200 MG/2ML IV SOLN
INTRAVENOUS | Status: DC | PRN
  Administered 2014-12-12: 200 mg via INTRAVENOUS

## 2014-12-12 MED ORDER — 0.9 % SODIUM CHLORIDE (POUR BTL) OPTIME
TOPICAL | Status: DC | PRN
  Administered 2014-12-12: 1000 mL

## 2014-12-12 MED ORDER — HEPATITIS A VACCINE 1440 EL U/ML IM SUSP
1.0000 mL | Freq: Once | INTRAMUSCULAR | Status: DC
  Filled 2014-12-12: qty 1

## 2014-12-12 MED ORDER — PHENOL 1.4 % MT LIQD: 1.0000 | OROMUCOSAL | Status: DC | PRN

## 2014-12-12 MED ORDER — GLYCOPYRROLATE 0.2 MG/ML IJ SOLN
INTRAMUSCULAR | Status: DC | PRN
  Administered 2014-12-12: 0.2 mg via INTRAVENOUS

## 2014-12-12 MED ORDER — AMLODIPINE BESYLATE 10 MG PO TABS
10.0000 mg | ORAL_TABLET | Freq: Every day | ORAL | Status: DC
  Administered 2014-12-12 – 2014-12-13 (×2): 10 mg via ORAL
  Filled 2014-12-12 (×2): qty 1

## 2014-12-12 MED ORDER — OXYCODONE HCL 5 MG PO TABS
5.0000 mg | ORAL_TABLET | ORAL | Status: DC | PRN
  Administered 2014-12-12: 5 mg via ORAL
  Filled 2014-12-12: qty 1

## 2014-12-12 MED ORDER — BUPIVACAINE-EPINEPHRINE (PF) 0.25% -1:200000 IJ SOLN
INTRAMUSCULAR | Status: AC
  Filled 2014-12-12: qty 30

## 2014-12-12 MED ORDER — ROCURONIUM BROMIDE 100 MG/10ML IV SOLN
INTRAVENOUS | Status: DC | PRN
  Administered 2014-12-12: 50 mg via INTRAVENOUS

## 2014-12-12 MED ORDER — ONDANSETRON 4 MG PO TBDP
4.0000 mg | ORAL_TABLET | Freq: Four times a day (QID) | ORAL | Status: DC | PRN
Start: 2014-12-12 — End: 2014-12-13

## 2014-12-12 SURGICAL SUPPLY — 52 items
APL SKNCLS STERI-STRIP NONHPOA (GAUZE/BANDAGES/DRESSINGS) ×1
APPLIER CLIP 5 13 M/L LIGAMAX5 (MISCELLANEOUS) ×3
APR CLP MED LRG 5 ANG JAW (MISCELLANEOUS) ×1
BAG SPEC RTRVL 10 TROC 200 (ENDOMECHANICALS) ×1
BANDAGE ADH SHEER 1  50/CT (GAUZE/BANDAGES/DRESSINGS) ×9 IMPLANT
BENZOIN TINCTURE PRP APPL 2/3 (GAUZE/BANDAGES/DRESSINGS) ×3 IMPLANT
BLADE SURG ROTATE 9660 (MISCELLANEOUS) IMPLANT
CANISTER SUCTION 2500CC (MISCELLANEOUS) ×3 IMPLANT
CHLORAPREP W/TINT 26ML (MISCELLANEOUS) ×3 IMPLANT
CLIP APPLIE 5 13 M/L LIGAMAX5 (MISCELLANEOUS) ×1 IMPLANT
CLOSURE WOUND 1/2 X4 (GAUZE/BANDAGES/DRESSINGS) ×1
COVER MAYO STAND STRL (DRAPES) ×3 IMPLANT
COVER SURGICAL LIGHT HANDLE (MISCELLANEOUS) ×3 IMPLANT
DRAPE C-ARM 42X72 X-RAY (DRAPES) ×3 IMPLANT
DRAPE LAPAROSCOPIC ABDOMINAL (DRAPES) ×2 IMPLANT
DRSG TEGADERM 4X4.75 (GAUZE/BANDAGES/DRESSINGS) ×3 IMPLANT
ELECT REM PT RETURN 9FT ADLT (ELECTROSURGICAL) ×3
ELECTRODE REM PT RTRN 9FT ADLT (ELECTROSURGICAL) ×1 IMPLANT
GAUZE SPONGE 2X2 8PLY STRL LF (GAUZE/BANDAGES/DRESSINGS) ×1 IMPLANT
GLOVE BIO SURGEON STRL SZ7 (GLOVE) ×2 IMPLANT
GLOVE BIOGEL M STRL SZ7.5 (GLOVE) ×3 IMPLANT
GLOVE BIOGEL PI IND STRL 7.0 (GLOVE) IMPLANT
GLOVE BIOGEL PI IND STRL 8 (GLOVE) ×1 IMPLANT
GLOVE BIOGEL PI INDICATOR 7.0 (GLOVE) ×4
GLOVE BIOGEL PI INDICATOR 8 (GLOVE) ×2
GLOVE SURG SS PI 7.0 STRL IVOR (GLOVE) ×2 IMPLANT
GOWN STRL REUS W/ TWL LRG LVL3 (GOWN DISPOSABLE) ×3 IMPLANT
GOWN STRL REUS W/ TWL XL LVL3 (GOWN DISPOSABLE) ×1 IMPLANT
GOWN STRL REUS W/TWL LRG LVL3 (GOWN DISPOSABLE) ×9
GOWN STRL REUS W/TWL XL LVL3 (GOWN DISPOSABLE) ×3
KIT BASIN OR (CUSTOM PROCEDURE TRAY) ×3 IMPLANT
KIT ROOM TURNOVER OR (KITS) ×3 IMPLANT
NS IRRIG 1000ML POUR BTL (IV SOLUTION) ×3 IMPLANT
PAD ARMBOARD 7.5X6 YLW CONV (MISCELLANEOUS) ×3 IMPLANT
POUCH RETRIEVAL ECOSAC 10 (ENDOMECHANICALS) ×1 IMPLANT
POUCH RETRIEVAL ECOSAC 10MM (ENDOMECHANICALS) ×2
SCISSORS LAP 5X35 DISP (ENDOMECHANICALS) ×3 IMPLANT
SET CHOLANGIOGRAPH 5 50 .035 (SET/KITS/TRAYS/PACK) ×3 IMPLANT
SET IRRIG TUBING LAPAROSCOPIC (IRRIGATION / IRRIGATOR) ×3 IMPLANT
SLEEVE ENDOPATH XCEL 5M (ENDOMECHANICALS) ×6 IMPLANT
SPECIMEN JAR SMALL (MISCELLANEOUS) ×3 IMPLANT
SPONGE GAUZE 2X2 STER 10/PKG (GAUZE/BANDAGES/DRESSINGS) ×2
STRIP CLOSURE SKIN 1/2X4 (GAUZE/BANDAGES/DRESSINGS) ×2 IMPLANT
SUT MNCRL AB 4-0 PS2 18 (SUTURE) ×3 IMPLANT
SUT VICRYL 0 UR6 27IN ABS (SUTURE) ×2 IMPLANT
TOWEL OR 17X24 6PK STRL BLUE (TOWEL DISPOSABLE) ×3 IMPLANT
TOWEL OR 17X26 10 PK STRL BLUE (TOWEL DISPOSABLE) ×3 IMPLANT
TRAY LAPAROSCOPIC MC (CUSTOM PROCEDURE TRAY) ×3 IMPLANT
TROCAR BLADELESS 11MM (ENDOMECHANICALS) ×2 IMPLANT
TROCAR XCEL BLUNT TIP 100MML (ENDOMECHANICALS) ×3 IMPLANT
TROCAR XCEL NON-BLD 5MMX100MML (ENDOMECHANICALS) ×3 IMPLANT
TUBING INSUFFLATION (TUBING) ×3 IMPLANT

## 2014-12-12 NOTE — Anesthesia Preprocedure Evaluation (Signed)
Anesthesia Evaluation  Patient identified by MRN, date of birth, ID band Patient awake    Reviewed: Allergy & Precautions, NPO status , Patient's Chart, lab work & pertinent test results  History of Anesthesia Complications Negative for: history of anesthetic complications  Airway Mallampati: II   Neck ROM: Full    Dental  (+) Missing, Poor Dentition, Dental Advisory Given   Pulmonary former smoker,    breath sounds clear to auscultation       Cardiovascular hypertension, Pt. on medications + CAD and + Peripheral Vascular Disease  + dysrhythmias  Rhythm:Regular Rate:Normal  EF 60%, Normal stress 2015   Neuro/Psych    GI/Hepatic GERD  Medicated,  Endo/Other  diabetes, Type 2, Insulin Dependent  Renal/GU Creat 2.1     Musculoskeletal  (+) Arthritis ,   Abdominal   Peds  Hematology  (+) anemia , 10/30   Anesthesia Other Findings   Reproductive/Obstetrics                             Anesthesia Physical Anesthesia Plan  ASA: III  Anesthesia Plan: General   Post-op Pain Management:    Induction: Intravenous  Airway Management Planned: Oral ETT  Additional Equipment:   Intra-op Plan:   Post-operative Plan: Extubation in OR  Informed Consent:   Dental advisory given  Plan Discussed with: CRNA and Surgeon  Anesthesia Plan Comments:         Anesthesia Quick Evaluation

## 2014-12-12 NOTE — Transfer of Care (Signed)
Immediate Anesthesia Transfer of Care Note  Patient: [name] [name]  Procedure(s) Performed: Procedure(s): LAPAROSCOPIC CHOLECYSTECTOMY WITH INTRAOPERATIVE CHOLANGIOGRAM (N/A)  Patient Location: PACU  Anesthesia Type:General  Level of Consciousness: awake, alert  and oriented  Airway & Oxygen Therapy: Patient Spontanous Breathing and Patient connected to nasal cannula oxygen  Post-op Assessment: Report given to RN and Post -op Vital signs reviewed and stable  Post vital signs: Reviewed and stable  Last Vitals:  Filed Vitals:   12/12/14 1119  BP: 180/73  Pulse: 62  Temp: 36.2 C  Resp: 18    Complications: No apparent anesthesia complications

## 2014-12-12 NOTE — Anesthesia Postprocedure Evaluation (Signed)
2 Anesthesia Post-op Note  Patient: [name] [name]  Procedure(s) Performed: Procedure(s): LAPAROSCOPIC CHOLECYSTECTOMY WITH INTRAOPERATIVE CHOLANGIOGRAM (N/A)  Patient Location: PACU  Anesthesia Type:General  Level of Consciousness: awake and alert   Airway and Oxygen Therapy: Patient Spontanous Breathing  Post-op Pain: mild  Post-op Assessment: Post-op Vital signs reviewed              Post-op Vital Signs: stable  Last Vitals:  Filed Vitals:   12/12/14 1233  BP: 178/79  Pulse: 61  Temp:   Resp: 13    Complications: No apparent anesthesia complications

## 2014-12-12 NOTE — Op Note (Signed)
[name] [name] [id number] 10/17/1945 12/12/2014  Laparoscopic Cholecystectomy with IOC Procedure Note  Indications: This patient presents with symptomatic gallbladder disease and will undergo laparoscopic cholecystectomy. pls see chart for additional information  Pre-operative Diagnosis: acute calculous cholecystitis  Post-operative Diagnosis: same + nodular liver + mild ascites  Surgeon: Atilano Ina   Assistants: none  Anesthesia: General endotracheal anesthesia  ASA Class: 3  Procedure Details  The patient was seen again in the Holding Room. The risks, benefits, complications, treatment options, and expected outcomes were discussed with the patient. The possibilities of reaction to medication, pulmonary aspiration, perforation of viscus, bleeding, recurrent infection, finding a normal gallbladder, the need for additional procedures, failure to diagnose a condition, the possible need to convert to an open procedure, and creating a complication requiring transfusion or operation were discussed with the patient. The likelihood of improving the patient's symptoms with return to their baseline status is good.  The patient and/or family concurred with the proposed plan, giving informed consent. The site of surgery properly noted. The patient was taken to Operating Room, identified as [name] [name] and the procedure verified as Laparoscopic Cholecystectomy with Intraoperative Cholangiogram. A Time Out was held and the above information confirmed. Patient was on therapeutic antibiotics.   General endotracheal anesthesia was then administered and tolerated well. After the induction, the abdomen was prepped with Chloraprep and draped in the sterile fashion. The patient was positioned in the supine position.  Local anesthetic agent was injected into the skin near the umbilicus and an incision made. We dissected down to the abdominal fascia with blunt dissection.  The fascia was incised  vertically and we entered the peritoneal cavity bluntly.  A pursestring suture of 0-Vicryl was placed around the fascial opening.  The Hasson cannula was inserted and secured with the stay suture.  Pneumoperitoneum was then created with CO2 and tolerated well without any adverse changes in the patient's vital signs. An 5-mm port was placed in the subxiphoid position.  Two 5-mm ports were placed in the right upper quadrant. All skin incisions were infiltrated with a local anesthetic agent before making the incision and placing the trocars.   We positioned the patient in reverse Trendelenburg, tilted slightly to the patient's left.  The liver was somewhat nodular and there was some mild low volume blood tinged ascites. The gallbladder was distended and had to be aspirated in order to be grasped. The gallbladder was identified, the fundus grasped and retracted cephalad. Adhesions were lysed bluntly and with the electrocautery where indicated, taking care not to injure any adjacent organs or viscus. The infundibulum was grasped and retracted laterally, exposing the peritoneum overlying the triangle of Calot. This was then divided and exposed in a blunt fashion. A critical view of the cystic duct and cystic artery was obtained.  The cystic duct was clearly identified and bluntly dissected circumferentially. The cystic duct was ligated with a clip distally.   An incision was made in the cystic duct and the [facility] cholangiogram catheter introduced. The catheter was secured using a clip. A cholangiogram was then obtained which showed good visualization of the distal and proximal biliary tree with no sign of filling defects or obstruction.  Contrast flowed easily into the duodenum. The catheter was then removed.   The cystic duct was then ligated with clips and divided. The cystic artery which had been identified & dissected free was ligated with clips and divided as well.   The gallbladder was dissected from the liver  bed in retrograde fashion with the electrocautery. There was some spillage from the posterior wall of the gallbladder of some gallstones. The epigastric trocar was changed to a 12mm trocar and a stone extractor was used to remove all of the spilled gallstones.  The gallbladder was removed and placed in an Ecco sac.  The gallbladder and Ecco sac were then removed through the umbilical port site. The liver bed was irrigated and inspected. Hemostasis was achieved with the electrocautery. Copious irrigation was utilized and was repeatedly aspirated until clear.  The pursestring suture was used to close the umbilical fascia.  An additional interrupted 0 vicryl suture was placed at the umbilical fascia.   We again inspected the right upper quadrant for hemostasis.  The umbilical closure was inspected and there was no air leak and nothing trapped within the closure. Pneumoperitoneum was released as we removed the trocars.  4-0 Monocryl was used to close the skin.   Benzoin, steri-strips, and clean dressings were applied. The patient was then extubated and brought to the recovery room in stable condition. Instrument, sponge, and needle counts were correct at closure and at the conclusion of the case.   Findings: Cholecystitis with Cholelithiasis, mild nodular liver with ascites  Estimated Blood Loss: Minimal         Drains: none         Specimens: Gallbladder           Complications: None; patient tolerated the procedure well.         Disposition: PACU - hemodynamically stable.         Condition: stable  [name] [name]. Andrey Campanile, MD, FACS General, Bariatric, & Minimally Invasive Surgery [facility], [place]

## 2014-12-12 NOTE — H&P (View-Only) (Signed)
Patient ID: [name] [name], male   DOB: 01/27/1946, 69 y.o.   MRN: [id number]    Subjective: Pt has never had pain, still doesn't.  Having nausea is his biggest complaint.  Objective: Vital signs in last 24 hours: Temp:  [97.4 F (36.3 C)-99.2 F (37.3 C)] 99.2 F (37.3 C) (09/19 0627) Pulse Rate:  [58-69] 69 (09/19 0627) Resp:  [10-23] 16 (09/19 0627) BP: (132-174)/(63-79) 136/63 mmHg (09/19 0627) SpO2:  [96 %-100 %] 96 % (09/19 0627) Weight:  [83.008 kg (183 lb)-87.091 kg (192 lb)] 85.276 kg (188 lb) (09/19 0627) Last BM Date: 12/08/14  Intake/Output from previous day: 09/18 0701 - 09/19 0700 In: -  Out: 400 [Urine:400] Intake/Output this shift:    PE: Abd: soft, NT, Nd, +BS Heart: regular right now Lungs: CTAB  Lab Results:   Recent Labs  12/10/14 1052 12/11/14 0430  WBC 5.8 6.0  HGB 9.2* 8.3*  HCT 28.7* 26.0*  PLT 166 134*   BMET  Recent Labs  12/10/14 1052 12/11/14 0430  NA 137 138  K 5.5* 4.2  CL 112* 113*  CO2 19* 19*  GLUCOSE 158* 86  BUN 54* 42*  CREATININE 3.31* 2.90*  CALCIUM 7.9* 7.7*   PT/INR  Recent Labs  12/11/14 0430  LABPROT 19.7*  INR 1.67*   CMP     Component Value Date/Time   NA 138 12/11/2014 0430   NA 140 11/22/2014 1029   K 4.2 12/11/2014 0430   CL 113* 12/11/2014 0430   CO2 19* 12/11/2014 0430   GLUCOSE 86 12/11/2014 0430   GLUCOSE 178* 11/22/2014 1029   BUN 42* 12/11/2014 0430   BUN 50* 11/22/2014 1029   CREATININE 2.90* 12/11/2014 0430   CREATININE 1.69* 08/22/2014 1439   CALCIUM 7.7* 12/11/2014 0430   PROT 6.1* 12/11/2014 0430   ALBUMIN 2.4* 12/11/2014 0430   ALBUMIN 2.4* 12/11/2014 0430   AST 28 12/11/2014 0430   ALT 20 12/11/2014 0430   ALKPHOS 63 12/11/2014 0430   BILITOT 0.5 12/11/2014 0430   GFRNONAA 20* 12/11/2014 0430   GFRNONAA 40* 08/22/2014 1439   GFRAA 24* 12/11/2014 0430   GFRAA 46* 08/22/2014 1439   Lipase     Component Value Date/Time   LIPASE 18* 12/10/2014 1052        Studies/Results: Ct Abdomen Pelvis Wo Contrast  12/10/2014   CLINICAL DATA:  Nausea and vomiting intermittent that comes on after eating and when laying flat. Pt states hx of acid reflux  EXAM: CT ABDOMEN AND PELVIS WITHOUT CONTRAST  TECHNIQUE: Multidetector CT imaging of the abdomen and pelvis was performed following the standard protocol without IV contrast.  COMPARISON:  06/10/2010  FINDINGS: Lower chest:  Bilateral lower lobe mild dependent atelectasis  Hepatobiliary: Liver normal without contrast. Innumerable small gallstones. Possible mild gallbladder wall thickening  Pancreas: Normal  Spleen: Normal  Adrenals/Urinary Tract: Progressive significant diffuse bladder wall thickening more so along the left side. Mild left hydroureteronephrosis. Mild progressive perinephric inflammation when compared to prior study. 1 cm low-attenuation area upper pole left kidney possibly a cyst. Right kidney also shows mild perinephric inflammation and mild prominence of the ureter, similar but slightly less prominent when compared to the contralateral side.  Stomach/Bowel: Stool throughout the colon and into the rectum  Vascular/Lymphatic: Extensive aortoiliac atherosclerotic calcification  Reproductive: Prostatic fossa largely obscured by beam attenuation artifact from left hip replacement  Other: No free fluid  Musculoskeletal: No acute findings. Chronic compression deformity L3 and L4.  IMPRESSION: 1.   Cholelithiasis. Possible mild gallbladder wall thickening. Correlate clinically and consider right upper quadrant ultrasound if indicated. 2. Progressive bladder wall thickening suggesting hypertrophy from outlet obstruction, although cystitis or infiltrating mass not entirely excluded. This appears to be U related to bilateral mild ureteral dilatation which could reflect an element of obstruction or reflux. There is mild progressive perinephric inflammation. Urinary tract infection is not excluded.    Electronically Signed   By: Raymond  Rubner M.D.   On: 12/10/2014 12:46   Us Abdomen Limited Ruq  12/10/2014   CLINICAL DATA:  Right upper quadrant pain for 1 week  EXAM: US ABDOMEN LIMITED - RIGHT UPPER QUADRANT  COMPARISON:  None.  FINDINGS: Gallbladder:  Multiple mobile gallstones the largest measuring 7 mm. No Murphy's sign. Wall of the gallbladder is thickened to 7 mm.  Common bile duct:  Diameter: 2 mm  Liver:  No focal lesion identified. Within normal limits in parenchymal echogenicity.  Incidental note of right renal cysts.  IMPRESSION: Cholelithiasis and gallbladder wall thickening concerning for the possibility of cholecystitis, although there is no sonographic Murphy's sign.   Electronically Signed   By: Raymond  Rubner M.D.   On: 12/10/2014 14:01    Anti-infectives: Anti-infectives    Start     Dose/Rate Route Frequency Ordered Stop   12/11/14 1600  cefTRIAXone (ROCEPHIN) 2 g in dextrose 5 % 50 mL IVPB     2 g 100 mL/hr over 30 Minutes Intravenous Every 24 hours 12/10/14 1953     12/10/14 1900  piperacillin-tazobactam (ZOSYN) IVPB 2.25 g  Status:  Discontinued     2.25 g 100 mL/hr over 30 Minutes Intravenous 4 times per day 12/10/14 1753 12/10/14 1809   12/10/14 1830  cefTRIAXone (ROCEPHIN) 2 g in dextrose 5 % 50 mL IVPB     2 g 100 mL/hr over 30 Minutes Intravenous  Once 12/10/14 1809 12/10/14 1922   12/10/14 1630  piperacillin-tazobactam (ZOSYN) IVPB 3.375 g  Status:  Discontinued     3.375 g 100 mL/hr over 30 Minutes Intravenous  Once 12/10/14 1624 12/10/14 1809       Assessment/Plan  1. Cholecystitis with cholelithiasis -his labs are normal and he is not having pain, but his US shows wall thickness of 7mm.  He needs cardiology to see him and make recommendations for clearance.  If he is cleared, we can proceed this admission, but if he is not then we can always try to se this up as an outpatient. -cont NPO for now incase cards needs to do something. -will follow   LOS: 1  day    OSBORNE,KELLY E 12/11/2014, 8:46 AM Pager: [phone]  

## 2014-12-12 NOTE — Progress Notes (Signed)
Internal Medicine Attending:   I saw and examined the patient. I reviewed the resident's note and I agree with the resident's findings and plan as documented in the resident's note.  Patient is doing well postoperatively. Vitals are stable, abdomen is non-tender. Mild-moderate cholecystitis now treated with cholecystectomy without immediate complication. Discontinue ceftriaxone. Monitor overnight, advance diet, transfer patient to a chair when able. There was some sign of hepatic fibrosis during the surgery when they encountered small ascites and nodular liver edge, so I anticipate the patient will be eligible for hepatitis C treatment in the near future. 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

## 2014-12-12 NOTE — Anesthesia Procedure Notes (Signed)
Procedure Name: Intubation Date/Time: 12/12/2014 10:04 AM Performed by: Marena Chancy Pre-anesthesia Checklist: Patient identified, Timeout performed, Emergency Drugs available, Suction available and Patient being monitored Patient Re-evaluated:Patient Re-evaluated prior to inductionOxygen Delivery Method: Circle system utilized Preoxygenation: Pre-oxygenation with 100% oxygen Intubation Type: IV induction Ventilation: Mask ventilation without difficulty and Oral airway inserted - appropriate to patient size Laryngoscope Size: Hyacinth Meeker and 2 Grade View: Grade I Tube type: Oral Tube size: 7.5 mm Number of attempts: 1 Placement Confirmation: ETT inserted through vocal cords under direct vision,  breath sounds checked- equal and bilateral and positive ETCO2 Secured at: 23 cm Tube secured with: Tape Dental Injury: Teeth and Oropharynx as per pre-operative assessment

## 2014-12-12 NOTE — Interval H&P Note (Signed)
History and Physical Interval Note:  12/12/2014 9:20 AM  [name] [name]  has presented today for surgery, with the diagnosis of CHOLECYSTITIS  The various methods of treatment have been discussed with the patient and family. After consideration of risks, benefits and other options for treatment, the patient has consented to  Procedure(s): LAPAROSCOPIC CHOLECYSTECTOMY WITH INTRAOPERATIVE CHOLANGIOGRAM (N/A) as a surgical intervention .  The patient's history has been reviewed, patient examined, no change in status, stable for surgery.  I have reviewed the patient's chart and labs.  Questions were answered to the patient's satisfaction.    Some nausea. Denies pain Inr ok this am Alert, nontoxic cta Soft, nt, nd  All questions asked and answered  Mary Sella. Andrey Campanile, MD, FACS General, Bariatric, & Minimally Invasive Surgery [facility], [place]   [facility] M

## 2014-12-12 NOTE — Progress Notes (Signed)
Subjective: Mr. Mcneill continues to have some nausea but is improved with Phenergan. Denies any abdominal pain. Will go for cholecystectomy today.  Objective: Vital signs in last 24 hours: Filed Vitals:   12/11/14 0627 12/11/14 1300 12/11/14 2202 12/12/14 0500  BP: 136/63 147/72 152/73 142/79  Pulse: 69 59 60 64  Temp: 99.2 F (37.3 C) 98.4 F (36.9 C) 98.8 F (37.1 C) 98.5 F (36.9 C)  TempSrc: Oral Oral Oral   Resp: Height:      Weight: 188 lb (85.276 kg)   186 lb 15 oz (84.795 kg)  SpO2: 96% 99% 99% 100%   Weight change: -5 lb 1 oz (-2.296 kg)  Intake/Output Summary (Last 24 hours) at 12/12/14 0745 Last data filed at 12/12/14 0500  Gross per 24 hour  Intake 956.25 ml  Output   1925 ml  Net -968.75 ml   Physical Exam Gen: Well developed, well nourished male lying in bed, in no distress CV: Regular rate and rhythm, no murmurs Lungs: bibasilar crackles, no wheezes Abd: Soft, nondistended, nontender, negative Murphy sign, normal bowel sounds Ext: Well-perfused, normal joints, no edema, Right AKA Skin: Warm, no rashes Neuro: Alert and oriented x3   Medications: I have reviewed the patient's current medications. Scheduled Meds: . amiodarone  200 mg Oral Daily  . carvedilol  12.5 mg Oral BID WC  . cefTRIAXone (ROCEPHIN)  IV  2 g Intravenous Q24H  . docusate sodium  100 mg Oral BID  . heparin  5,000 Units Subcutaneous 3 times per day  . insulin aspart  0-15 Units Subcutaneous TID WC  . pantoprazole  40 mg Oral Daily  . pneumococcal 23 valent vaccine  0.5 mL Intramuscular Tomorrow-1000   Continuous Infusions:   PRN Meds:.fluticasone, hydroxypropyl methylcellulose / hypromellose, promethazine Assessment/Plan: Principal Problem:   Cholelithiasis with acute and chronic cholecystitis without biliary obstruction Active Problems:   Type 2 diabetes mellitus with neurological manifestations   Essential hypertension   Atrial flutter   HCV antibody  positive  Cholelithiasis with possible cholecystitis without biliary obstruction: Continues to have some nausea but well controlled with Phenergan. Will have cholecystectomy today.  - Continue Ceftriaxone 2g IV qdaily  - Phenergan 12.5 mg IV q6hr prn - Holding Apixaban  CAD: s/p DES to RCA in 2006. No chest pain or SOB. Cleared by cardiology for surgery. - Coninue statin and carvedilol 12.5 mg bid.  Atrial Flutter: Had ablation scheduled for yesterday. Will have to reschedule to follow up outpatient. Anticoagulated with Apixipan at home, holding in anticipation of surgery. CHADS-VASC score of 4. Rate controlled on amiodarone and carvedilol.  - Amiodarone 200 mg daily and carvediolol 12.5 mg bid  HCV Ab positive: Hepatic function panel wnl.  - HCV RNA quant 4.7 million, active HCV infection - Will need [place] with elastography as an outpatient and follow up with [facility] hepatitis clinic - Not immunized against Hep A, will give Hep A vaccine here and will need another dose in 6 months - Immunity to Hep B from previous infection  Elevated TSH: TSH 15. Will check free T4 and T3 uptake. Possibly 2/2 HCV.   DM type 2: CBGs well controlled. Hgb A1c 6.3. - Continue SSI - CBGs qac and qhs  HTN: BP elevated today.  - Continue Carvedilol 12.5 mg bid.   - Restart home Amlodipine  daily  DVT PPx: Heparin  Dispo: Disposition is deferred at this time, awaiting improvement of current medical problems.  Anticipated discharge  in approximately 1-2 day(s).   The patient does have a current PCP Doneen Poisson, MD) and does need an [facility] hospital follow-up appointment after discharge.  The patient does not have transportation limitations that hinder transportation to clinic appointments.  .Services Needed at time of discharge: Y = Yes, Blank = No PT:   OT:   RN:   Equipment:   Other:     LOS: 2 days   Valentino Nose, MD IMT[facility] PGY-1 [phone] 12/12/2014, 7:45 AM

## 2014-12-13 ENCOUNTER — Encounter (HOSPITAL_COMMUNITY): Payer: Self-pay | Admitting: General Surgery

## 2014-12-13 DIAGNOSIS — I483 Typical atrial flutter: Secondary | ICD-10-CM

## 2014-12-13 DIAGNOSIS — K8012 Calculus of gallbladder with acute and chronic cholecystitis without obstruction: Principal | ICD-10-CM

## 2014-12-13 LAB — URINE CULTURE: Culture: 50000

## 2014-12-13 LAB — GLUCOSE, CAPILLARY
GLUCOSE-CAPILLARY: 112 mg/dL — AB (ref 65–99)
Glucose-Capillary: 89 mg/dL (ref 65–99)
Glucose-Capillary: 99 mg/dL (ref 65–99)

## 2014-12-13 LAB — T3 UPTAKE: T3 UPTAKE RATIO: 39 % (ref 24–39)

## 2014-12-13 LAB — CBC
HCT: 27 % — ABNORMAL LOW (ref 39.0–52.0)
Hemoglobin: 8.5 g/dL — ABNORMAL LOW (ref 13.0–17.0)
MCH: 22.9 pg — AB (ref 26.0–34.0)
MCHC: 31.5 g/dL (ref 30.0–36.0)
MCV: 72.8 fL — ABNORMAL LOW (ref 78.0–100.0)
PLATELETS: 155 10*3/uL (ref 150–400)
RBC: 3.71 MIL/uL — AB (ref 4.22–5.81)
RDW: 15.6 % — AB (ref 11.5–15.5)
WBC: 8.3 10*3/uL (ref 4.0–10.5)

## 2014-12-13 LAB — BASIC METABOLIC PANEL
ANION GAP: 6 (ref 5–15)
BUN: 37 mg/dL — ABNORMAL HIGH (ref 6–20)
CALCIUM: 8 mg/dL — AB (ref 8.9–10.3)
CO2: 19 mmol/L — ABNORMAL LOW (ref 22–32)
Chloride: 118 mmol/L — ABNORMAL HIGH (ref 101–111)
Creatinine, Ser: 2.83 mg/dL — ABNORMAL HIGH (ref 0.61–1.24)
GFR, EST AFRICAN AMERICAN: 24 mL/min — AB (ref >60)
GFR, EST NON AFRICAN AMERICAN: 21 mL/min — AB (ref >60)
GLUCOSE: 94 mg/dL (ref 65–99)
Potassium: 5.4 mmol/L — ABNORMAL HIGH (ref 3.5–5.1)
Sodium: 143 mmol/L (ref 135–145)

## 2014-12-13 LAB — MAGNESIUM: Magnesium: 1.7 mg/dL (ref 1.7–2.4)

## 2014-12-13 MED ORDER — OXYCODONE HCL 5 MG PO TABS: 5.0000 mg | ORAL_TABLET | Freq: Four times a day (QID) | ORAL | Status: DC | PRN

## 2014-12-13 NOTE — Discharge Instructions (Signed)
Mr. Pullara you were admitted with gallbladder inflammation from gallstones. You had your gallbladder removed. We have stopped your Eliquis in light of the surgery. Please stop taking the Eliquis for now until you follow up in our clinic.   You have an appointment in our clinic [facility] for Wednesday September 28th at 9:45 am.   Your follow up with the surgeons is scheduled for Tuesday October 11th at 1:45 PM.   Please call your Cardiologist's office to reschedule your ablation procedure.    [facility] ______[facility] LAPAROSCOPIC SURGERY: POST OP INSTRUCTIONS Always review your discharge instruction sheet given to you by the facility where your surgery was performed. IF YOU HAVE DISABILITY OR FAMILY LEAVE FORMS, YOU MUST BRING THEM TO THE OFFICE FOR PROCESSING.   DO NOT GIVE THEM TO YOUR DOCTOR.  1. A prescription for pain medication may be given to you upon discharge.  Take your pain medication as prescribed, if needed.  If narcotic pain medicine is not needed, then you may take acetaminophen (Tylenol) or ibuprofen (Advil) as needed. 2. Take your usually prescribed medications unless otherwise directed. 3. If you need a refill on your pain medication, please contact your pharmacy.  They will contact our office to request authorization. Prescriptions will not be filled after 5pm or on week-ends. 4. You should follow a light diet the first few days after arrival home, such as soup and crackers, etc.  Be sure to include lots of fluids daily. 5. Most patients will experience some swelling and bruising in the area of the incisions.  Ice packs will help.  Swelling and bruising can take several days to resolve.  6. It is common to experience some constipation if taking pain medication after surgery.  Increasing fluid intake and taking a stool softener (such as Colace) will usually help or prevent this problem from occurring.  A mild laxative (Milk of  Magnesia or Miralax) should be taken according to package instructions if there are no bowel movements after 48 hours. 7. Unless discharge instructions indicate otherwise, you may remove your bandages 24-48 hours after surgery, and you may shower at that time.  You may have steri-strips (small skin tapes) in place directly over the incision.  These strips should be left on the skin for 7-10 days.  If your surgeon used skin glue on the incision, you may shower in 24 hours.  The glue will flake off over the next 2-3 weeks.  Any sutures or staples will be removed at the office during your follow-up visit. 8. ACTIVITIES:  You may resume regular (light) daily activities beginning the next day--such as daily self-care, walking, climbing stairs--gradually increasing activities as tolerated.  You may have sexual intercourse when it is comfortable.  Refrain from any heavy lifting or straining until approved by your doctor. a. You may drive when you are no longer taking prescription pain medication, you can comfortably wear a seatbelt, and you can safely maneuver your car and apply brakes. b. RETURN TO WORK:  __________________________________________________________ 9. You should see your doctor in the office for a follow-up appointment approximately 2-3 weeks after your surgery.  Make sure that you call for this appointment within a day or two after you arrive home to insure a convenient appointment time. 10. OTHER INSTRUCTIONS: __________________________________________________________________________________________________________________________ __________________________________________________________________________________________________________________________ WHEN TO CALL YOUR DOCTOR: 1. Fever over 101.0 2. Inability to urinate 3. Continued bleeding from incision. 4. Increased pain, redness, or drainage from the incision. 5. Increasing  abdominal pain  The clinic staff is available to answer your  questions during regular business hours.  Please dont hesitate to call and ask to speak to one of the nurses for clinical concerns.  If you have a medical emergency, go to the nearest emergency room or call 911.  A surgeon from [facility] is always on call at the hospital. [address], [address], [place], [place]  [address] ? [address] [address], [place], [place]   [address] [phone] ? [phone] ? FAX [phone] Web site: [email or web address]

## 2014-12-13 NOTE — Progress Notes (Signed)
Subjective: HPI: [name] [name] is a 69 y.o. male with a PMH significant for HTN, HLD, GERD, DM2, AKA, recently diagnosed HEP C w/o cirrhosis and AFIB tx with Eliquis on hospital stay day 2 for cholelithiasis and suspected cholecystitis.   Overnight Events: Restarted amlodipine, blood pressures subsequently normalized   Subjective:   Patient feels comfortable and is eating solid food. States that he has some mild abdominal pain associated with his surgery. Denies nausea and vomiting   Objective: Vital signs in last 24 hours: Filed Vitals:   12/12/14 1410 12/12/14 2126 12/13/14 0153 12/13/14 0532  BP: 182/91 135/81 149/75 142/84  Pulse: 64 64 67 69  Temp: 97.7 F (36.5 C) 98.3 F (36.8 C) 99.3 F (37.4 C) 99.4 F (37.4 C)  TempSrc: Oral Oral    Resp: $Remo'15 16 16 17  'AtAeA$ Height:      Weight:    88.134 kg (194 lb 4.8 oz)  SpO2: 100% 100% 97% 98%   Weight change: 3.339 kg (7 lb 5.8 oz)  Intake/Output Summary (Last 24 hours) at 12/13/14 0957 Last data filed at 12/13/14 0831  Gross per 24 hour  Intake   1160 ml  Output   1450 ml  Net   -290 ml   Physical Exam Gen: Well developed, well nourished male lying in bed, in no distress CV: Regular rate and rhythm, no murmurs Lungs: clear to auscultation bilaterally, no wheezes or crackles Abd: Soft, nondistended, nontender, negative Murphy sign, normal bowel sounds. Surgical sites covered with bandages. No obvious blood or drainage. Ext: Well-perfused, normal joints, no edema, Right AKA Skin: Warm, no rashes Neuro: Alert and oriented x3  Date: 12/13/2014 Patient: [name] [name] Admitted: 12/10/2014 10:23 AM Attending Provider: Axel Filler, MD  [name] or his authorized caregiver has made the decision for the patient to leave the emergency department against the advice of Axel Filler, MD.  He or his authorized caregiver has been informed and understands the inherent risks, including death.  He or his  authorized caregiver has decided to accept the responsibility for this decision. [name] and all necessary parties have been advised that he may return for further evaluation or treatment. His condition at time of discharge was Stable.  [name] had current vital signs as follows:  Blood pressure 142/84, pulse 69, temperature 99.4 F (37.4 C), temperature source Oral, resp. rate 17, height $RemoveBe'5\' 8"'BRWMkhwTn$  (1.727 m), weight 88.134 kg (194 lb 4.8 oz), SpO2 98 %.   [name] or his authorized caregiver has not signed the Leaving Against Medical Advice form prior to leaving the department.  [name] 12/13/2014     Lab Results: Results for orders placed or performed during the hospital encounter of 12/10/14 (from the past 48 hour(s))  Glucose, capillary     Status: None   Collection Time: 12/11/14 11:59 AM  Result Value Ref Range   Glucose-Capillary 88 65 - 99 mg/dL  Glucose, capillary     Status: Abnormal   Collection Time: 12/11/14  4:44 PM  Result Value Ref Range   Glucose-Capillary 138 (H) 65 - 99 mg/dL  Hepatitis A antibody, total     Status: None   Collection Time: 12/11/14  5:24 PM  Result Value Ref Range   Hep A Total Ab Negative Negative    Comment: (NOTE) Performed At: [facility] [address] [place], [place] [id number] Lindon Romp MD [id number]   Hepatitis B core antibody, total  Status: Abnormal   Collection Time: 12/11/14  5:24 PM  Result Value Ref Range   Hep B Core Total Ab Positive (A) Negative    Comment: (NOTE) Performed At: [facility] [place], [place] [id number] Lindon Romp MD [id number]   Hepatitis B surface antibody     Status: None   Collection Time: 12/11/14  5:24 PM  Result Value Ref Range   Hep B S Ab Reactive     Comment: (NOTE)              Non Reactive: Inconsistent with immunity,                            less than 10 mIU/mL              Reactive:     Consistent with  immunity,                            greater than 9.9 mIU/mL Performed At: [facility] [place], [place] [id number] Lindon Romp MD [id number]   Hepatitis B surface antigen     Status: None   Collection Time: 12/11/14  5:24 PM  Result Value Ref Range   Hepatitis B Surface Ag Negative Negative    Comment: (NOTE) Performed At: [facility] [place], [place] [id number] Lindon Romp MD [id number]   Surgical pcr screen     Status: None   Collection Time: 12/11/14  8:01 PM  Result Value Ref Range   MRSA, PCR NEGATIVE NEGATIVE   Staphylococcus aureus NEGATIVE NEGATIVE    Comment:        The Xpert SA Assay (FDA approved for NASAL specimens in patients over 35 years of age), is one component of a comprehensive surveillance program.  Test performance has been validated by [facility] for patients greater than or equal to 69 year old. It is not intended to diagnose infection nor to guide or monitor treatment.   Glucose, capillary     Status: None   Collection Time: 12/11/14  9:57 PM  Result Value Ref Range   Glucose-Capillary 90 65 - 99 mg/dL   Comment 1 Notify RN    Comment 2 Document in Chart   CBC     Status: Abnormal   Collection Time: 12/12/14  5:06 AM  Result Value Ref Range   WBC 7.0 4.0 - 10.5 K/uL   RBC 3.64 (L) 4.22 - 5.81 MIL/uL   Hemoglobin 8.5 (L) 13.0 - 17.0 g/dL   HCT 26.5 (L) 39.0 - 52.0 %   MCV 72.8 (L) 78.0 - 100.0 fL   MCH 23.4 (L) 26.0 - 34.0 pg   MCHC 32.1 30.0 - 36.0 g/dL   RDW 15.5 11.5 - 15.5 %   Platelets 126 (L) 150 - 400 K/uL  Comprehensive metabolic panel     Status: Abnormal   Collection Time: 12/12/14  5:06 AM  Result Value Ref Range   Sodium 141 135 - 145 mmol/L   Potassium 5.0 3.5 - 5.1 mmol/L   Chloride 117 (H) 101 - 111 mmol/L   CO2 17 (L) 22 - 32 mmol/L   Glucose, Bld 86 65 - 99 mg/dL   BUN 41 (H) 6 - 20 mg/dL   Creatinine, Ser 2.80 (H) 0.61 - 1.24 mg/dL   Calcium 7.9 (L)  8.9 - 10.3 mg/dL  Total Protein 6.1 (L) 6.5 - 8.1 g/dL   Albumin 2.4 (L) 3.5 - 5.0 g/dL   AST 27 15 - 41 U/L   ALT 16 (L) 17 - 63 U/L   Alkaline Phosphatase 65 38 - 126 U/L   Total Bilirubin 0.3 0.3 - 1.2 mg/dL   GFR calc non Af Amer 21 (L) >60 mL/min   GFR calc Af Amer 25 (L) >60 mL/min    Comment: (NOTE) The eGFR has been calculated using the CKD EPI equation. This calculation has not been validated in all clinical situations. eGFR's persistently <60 mL/min signify possible Chronic Kidney Disease.    Anion gap 7 5 - 15  APTT     Status: Abnormal   Collection Time: 12/12/14  5:06 AM  Result Value Ref Range   aPTT 39 (H) 24 - 37 seconds    Comment:        IF BASELINE aPTT IS ELEVATED, SUGGEST PATIENT RISK ASSESSMENT BE USED TO DETERMINE APPROPRIATE ANTICOAGULANT THERAPY.   Protime-INR     Status: Abnormal   Collection Time: 12/12/14  5:06 AM  Result Value Ref Range   Prothrombin Time 17.2 (H) 11.6 - 15.2 seconds   INR 1.39 0.00 - 1.49  TSH     Status: Abnormal   Collection Time: 12/12/14  5:06 AM  Result Value Ref Range   TSH 15.115 (H) 0.350 - 4.500 uIU/mL  T3 uptake     Status: None   Collection Time: 12/12/14  5:06 AM  Result Value Ref Range   T3 Uptake Ratio 39 24 - 39 %    Comment: (NOTE) Performed At: [facility] [place], [place] [id number] Lindon Romp MD [id number]   Glucose, capillary     Status: None   Collection Time: 12/12/14  7:12 AM  Result Value Ref Range   Glucose-Capillary 89 65 - 99 mg/dL  Glucose, capillary     Status: Abnormal   Collection Time: 12/12/14 11:18 AM  Result Value Ref Range   Glucose-Capillary 112 (H) 65 - 99 mg/dL   Comment 1 Notify RN   T4, free     Status: None   Collection Time: 12/12/14  4:01 PM  Result Value Ref Range   Free T4 0.75 0.61 - 1.12 ng/dL  Glucose, capillary     Status: Abnormal   Collection Time: 12/12/14  4:57 PM  Result Value Ref Range   Glucose-Capillary 143 (H) 65 - 99  mg/dL  Glucose, capillary     Status: None   Collection Time: 12/12/14  9:17 PM  Result Value Ref Range   Glucose-Capillary 69 65 - 99 mg/dL   Comment 1 Notify RN    Comment 2 Document in Chart   Glucose, capillary     Status: Abnormal   Collection Time: 12/12/14 10:14 PM  Result Value Ref Range   Glucose-Capillary 139 (H) 65 - 99 mg/dL   Comment 1 Notify RN   Glucose, capillary     Status: None   Collection Time: 12/13/14  1:47 AM  Result Value Ref Range   Glucose-Capillary 89 65 - 99 mg/dL   Comment 1 Notify RN    Comment 2 Document in Chart   Basic metabolic panel     Status: Abnormal   Collection Time: 12/13/14  4:03 AM  Result Value Ref Range   Sodium 143 135 - 145 mmol/L   Potassium 5.4 (H) 3.5 - 5.1 mmol/L   Chloride 118 (H) 101 -  111 mmol/L   CO2 19 (L) 22 - 32 mmol/L   Glucose, Bld 94 65 - 99 mg/dL   BUN 37 (H) 6 - 20 mg/dL   Creatinine, Ser 2.83 (H) 0.61 - 1.24 mg/dL   Calcium 8.0 (L) 8.9 - 10.3 mg/dL   GFR calc non Af Amer 21 (L) >60 mL/min   GFR calc Af Amer 24 (L) >60 mL/min    Comment: (NOTE) The eGFR has been calculated using the CKD EPI equation. This calculation has not been validated in all clinical situations. eGFR's persistently <60 mL/min signify possible Chronic Kidney Disease.    Anion gap 6 5 - 15  Magnesium     Status: None   Collection Time: 12/13/14  4:03 AM  Result Value Ref Range   Magnesium 1.7 1.7 - 2.4 mg/dL  CBC     Status: Abnormal   Collection Time: 12/13/14  4:03 AM  Result Value Ref Range   WBC 8.3 4.0 - 10.5 K/uL   RBC 3.71 (L) 4.22 - 5.81 MIL/uL   Hemoglobin 8.5 (L) 13.0 - 17.0 g/dL   HCT 27.0 (L) 39.0 - 52.0 %   MCV 72.8 (L) 78.0 - 100.0 fL   MCH 22.9 (L) 26.0 - 34.0 pg   MCHC 31.5 30.0 - 36.0 g/dL   RDW 15.6 (H) 11.5 - 15.5 %   Platelets 155 150 - 400 K/uL  Glucose, capillary     Status: None   Collection Time: 12/13/14  7:14 AM  Result Value Ref Range   Glucose-Capillary 99 65 - 99 mg/dL   Micro Results: Recent  Results (from the past 240 hour(s))  Urine culture     Status: None   Collection Time: 12/10/14  1:24 PM  Result Value Ref Range Status   Specimen Description URINE, RANDOM  Final   Special Requests NONE  Final   Culture 50,000 COLONIES/mL PROTEUS MIRABILIS  Final   Report Status 12/13/2014 FINAL  Final   Organism ID, Bacteria PROTEUS MIRABILIS  Final      Susceptibility   Proteus mirabilis - MIC*    AMPICILLIN <=2 SENSITIVE Sensitive     CEFAZOLIN <=4 SENSITIVE Sensitive     CEFTRIAXONE <=1 SENSITIVE Sensitive     CIPROFLOXACIN <=0.25 SENSITIVE Sensitive     GENTAMICIN <=1 SENSITIVE Sensitive     IMIPENEM 4 SENSITIVE Sensitive     NITROFURANTOIN 128 RESISTANT Resistant     TRIMETH/SULFA <=20 SENSITIVE Sensitive     AMPICILLIN/SULBACTAM <=2 SENSITIVE Sensitive     PIP/TAZO <=4 SENSITIVE Sensitive     * 50,000 COLONIES/mL PROTEUS MIRABILIS  Surgical pcr screen     Status: None   Collection Time: 12/11/14  8:01 PM  Result Value Ref Range Status   MRSA, PCR NEGATIVE NEGATIVE Final   Staphylococcus aureus NEGATIVE NEGATIVE Final    Comment:        The Xpert SA Assay (FDA approved for NASAL specimens in patients over 3 years of age), is one component of a comprehensive surveillance program.  Test performance has been validated by [facility] for patients greater than or equal to 61 year old. It is not intended to diagnose infection nor to guide or monitor treatment.    Studies/Results: Dg Cholangiogram Operative  12/12/2014   CLINICAL DATA:  69 year old male with a history of laparoscopic cholecystectomy.  EXAM: INTRAOPERATIVE CHOLANGIOGRAM  TECHNIQUE: Cholangiographic images from the C-arm fluoroscopic device were submitted for interpretation post-operatively. Please see the procedural report for the amount of  contrast and the fluoroscopy time utilized.  COMPARISON:  12/10/2014, CT 12/10/2014  FINDINGS: Surgical instruments project over the upper abdomen.  There is cannulation  of the cystic duct/gallbladder neck, with antegrade infusion of contrast. Caliber of the extrahepatic ductal system within normal limits.  Vague ill-defined filling defects in the distal common bile duct mobile on injection.  Free flow of contrast across the ampulla.  IMPRESSION: Intraoperative cholangiogram demonstrates extrahepatic biliary ducts of unremarkable caliber, with vague mobile filling defects in the distal common bile duct, potentially debris, stones, or air. Free flow of contrast across the ampulla.  Please refer to the dictated operative report for full details of intraoperative findings and procedure  Signed,  Dulcy Fanny. Earleen Newport, DO  [facility]  [facility]   Electronically Signed   By: Corrie Mckusick D.O.   On: 12/12/2014 13:05   Medications:  Scheduled: . amiodarone  200 mg Oral Daily  . amLODipine  10 mg Oral Daily  . carvedilol  12.5 mg Oral BID WC  . docusate sodium  100 mg Oral BID  . heparin subcutaneous  5,000 Units Subcutaneous 3 times per day  . hepatitis A virus (PF) vaccine  1 mL Intramuscular Once  . insulin aspart  0-15 Units Subcutaneous TID WC  . pantoprazole  40 mg Oral Daily  . pneumococcal 23 valent vaccine  0.5 mL Intramuscular Tomorrow-1000   Scheduled Meds: . amiodarone  200 mg Oral Daily  . amLODipine  10 mg Oral Daily  . carvedilol  12.5 mg Oral BID WC  . docusate sodium  100 mg Oral BID  . heparin subcutaneous  5,000 Units Subcutaneous 3 times per day  . hepatitis A virus (PF) vaccine  1 mL Intramuscular Once  . insulin aspart  0-15 Units Subcutaneous TID WC  . pantoprazole  40 mg Oral Daily  . pneumococcal 23 valent vaccine  0.5 mL Intramuscular Tomorrow-1000   Continuous Infusions: . sodium chloride 10 mL/hr at 12/12/14 0900   PRN Meds:.fluticasone, hydroxypropyl methylcellulose / hypromellose, morphine injection, ondansetron **OR** ondansetron (ZOFRAN) IV, oxyCODONE, phenol, promethazine,  simethicone Assessment/Plan: Principal Problem:   Cholelithiasis with acute and chronic cholecystitis without biliary obstruction Active Problems:   Type 2 diabetes mellitus with neurological manifestations   Essential hypertension   Atrial flutter   Hepatitis C infection   Subclinical hypothyroidism   Patient is a 69 y.o. male with a hx significant for CAD, AFIB and DM2 on post op day 1 following and elective cholecysectomy  ## Cholelithiasis with cholecystitic w/o biliary obstruction: s/p cholecystectomy performed 09/20. Patient denies over pain. No overt bleeding from surgical sites. Patient tolerating solid food. -d/c ceftriaxone therapy today -Norco 5-10 mg Q6PRN x 40 tabs for pain control -Phenergen 12.5 mg IV q6prn -Hold apixiban x 7 days following surgery as per surgery recs -PT evaluation -f/u with [facility] on 09/28 at 945 AM  -discharge today pending PT and surgery recs  ##CAD: s/p DES to RCA (2006). No chest pain or SOB. Cleared for surgery by Cardiology -Pravastatin 40 mg qhs  -Carvedilol 12.5 mg bid   ## Atrial Flutter: Had ablation scheduled for 9/19 but cancelled due to hospitalization. Will reschedule as outpatient. Holding home [place] for 7 days post-surgery as per surgical recs.. CHADS-VASC score=4.  -Rate control with amiodarone $RemoveBeforeD'200mg'[id number]$  daily and carvedilol 12.5 mg bid   ##HCV Ab positive: Hepatic function within normal limtis -HCV RNA quant 4.7 million, chronic  -[place] elastography today (09/21), follow-up as outpatient w/ ID  hepatitis clinic -Not immunized against Hep A. Received Hep A vaccine here. Additional dose needed in 6 months  -Immune to Hep B- previous infection  ##Elevated TSH: TSH=15. Free T4= 0.75 (normal).  Concerning for subclinical hypothyroidism vs early primary hypertyroidism 2/2 to HCV.  -Check total T3 today. If normal, follow up in 3 months.   ##HTN: BP improved with restarting amlodipine -Carvedilol 12.5 mg bid -Amlodipine 10 mg daily    ##DVT PPx -Heparin  ##Dispo -D/C today   This is a Careers information officer Note.  The care of the patient was discussed with Dr. Evette Doffing and the assessment and plan formulated with their assistance.  Please see their attached note for official documentation of the daily encounter.   LOS: 3 days   Florentina Addison, Med Student 12/13/2014, 9:52 AM

## 2014-12-13 NOTE — Evaluation (Signed)
Physical Therapy Evaluation Patient Details Name: [name] [name] MRN: [id number] DOB: [birth year] Today's Date: 12/13/2014   History of Present Illness  [name] [name] is a 69 y.o. 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   Clinical Impression  Patient evaluated by Physical Therapy with no further acute PT needs identified. All education has been completed and the patient has no further questions. Pt donned and doffed prosthesis and ambulated with RW and min-guard A at same level as baseline. Pt and wife work well together. Discussed returning to normal activity level upon d/c home.  See below for any follow-up Physial Therapy or equipment needs. PT is signing off. Thank you for this referral.     Follow Up Recommendations No PT follow up    Equipment Recommendations  None recommended by PT    Recommendations for Other Services       Precautions / Restrictions Precautions Precautions: None Restrictions Weight Bearing Restrictions: No      Mobility  Bed Mobility Overal bed mobility: Modified Independent             General bed mobility comments: vc's to roll first to minimize pressore on abdomen, no assistance needed  Transfers Overall transfer level: Needs assistance Equipment used: Rolling walker (2 wheeled) Transfers: Sit to/from Stand Sit to Stand: Min guard         General transfer comment: pt has not stood since surgery, min-guard for safety as pt donned prosthesis but no physical assist needed  Ambulation/Gait Ambulation/Gait assistance: Min guard Ambulation Distance (Feet): 15 Feet Assistive device: Rolling walker (2 wheeled) Gait Pattern/deviations: Step-through pattern;Trunk flexed Gait velocity: decreased   General Gait Details: vc's to stay close to RW. min-guard for  safety. No LOB  Stairs            Wheelchair Mobility    Modified Rankin (Stroke Patients Only)       Balance Overall balance assessment: Needs assistance Sitting-balance support: No upper extremity supported Sitting balance-Leahy Scale: Good     Standing balance support: Bilateral upper extremity supported Standing balance-Leahy Scale: Poor Standing balance comment: Needs UE support with R AKA                             Pertinent Vitals/Pain Pain Assessment: No/denies pain    Home Living Family/patient expects to be discharged to:: Private residence Living Arrangements: Spouse/significant other Available Help at Discharge: Family Type of Home: House Home Access: Ramped entrance     Home Layout: Two level;Able to live on main level with bedroom/bathroom Home Equipment: Dan Humphreys - 2 wheels;Shower seat;Wheelchair - power;Bedside commode Additional Comments: independent with donning and doffing prosthesis    Prior Function Level of Independence: Needs assistance   Gait / Transfers Assistance Needed: pt primarily uses power chair for mobility. Supervision for ambulation with prosthesis  ADL's / Homemaking Assistance Needed: pt sponge bathes since THA; cannot transfer into tub. Wife assists with LB ADLs.         Hand Dominance   Dominant Hand: Right    Extremity/Trunk Assessment   Upper Extremity Assessment: Overall WFL for tasks assessed           Lower Extremity Assessment: Generalized weakness      Cervical / Trunk Assessment: Normal  Communication   Communication: No  difficulties  Cognition Arousal/Alertness: Awake/alert Behavior During Therapy: WFL for tasks assessed/performed Overall Cognitive Status: Within Functional Limits for tasks assessed                      General Comments General comments (skin integrity, edema, etc.): pt vomiting upin PT arrival, reported that the cucumbers at lunch did not agree with him. RN  present and gave meds, pt felt much better after vomiting    Exercises        Assessment/Plan    PT Assessment Patent does not need any further PT services  PT Diagnosis Difficulty walking   PT Problem List    PT Treatment Interventions     PT Goals (Current goals can be found in the Care Plan section) Acute Rehab PT Goals Patient Stated Goal: return home PT Goal Formulation: All assessment and education complete, DC therapy    Frequency     Barriers to discharge        Co-evaluation               End of Session Equipment Utilized During Treatment: Gait belt Activity Tolerance: Patient tolerated treatment well Patient left: in chair;with call bell/phone within reach;with family/visitor present Nurse Communication: Mobility status         Time: 1538-1601 PT Time Calculation (min) (ACUTE ONLY): 23 min   Charges:   PT Evaluation $Initial PT Evaluation Tier I: 1 Procedure     PT G Codes:      Lyanne Co, PT  Acute Rehab Services  [phone]   [place], [place] 12/13/2014, 4:10 PM

## 2014-12-13 NOTE — Progress Notes (Signed)
Internal Medicine Attending:   I saw and examined the patient. I reviewed the resident's note and I agree with the resident's findings and plan as documented in the resident's note.  Patient is doing well 1 day postoperative from cholecystectomy. Denies any abdominal pain. Eating and drinking well this morning. Denies any nausea or vomiting. Vitals are stable, abdomen is nontender on exam. Plan for the patient to work with physical therapy today. Cholecystitis now resolved. Likely will be ready for discharge to home later today. We will need one week hospital follow-up appointment with our clinic. TSH elevated, free T4 normal, awaiting total T3 levels. If T3 is normal, this will be subclinical hypothyroidism and he will need a follow-up TSH in 3 months. If T3 is low, primary hypothyroidism and we will start thyroid replacement.

## 2014-12-13 NOTE — Progress Notes (Signed)
Patient ID: [name] [name], male   DOB: [birth year], 69 y.o.   MRN: [id number]      [facility]      [address] [place], [place] [address]    Phone: [phone] FAX: [phone]     Subjective: Eating, passing flatus, voiding. VSS.  Afebrile.   Objective:  Vital signs:  Filed Vitals:   12/12/14 1410 12/12/14 2126 12/13/14 0153 12/13/14 0532  BP: 182/91 135/81 149/75 142/84  Pulse: 64 64 67 69  Temp: 97.7 F (36.5 C) 98.3 F (36.8 C) 99.3 F (37.4 C) 99.4 F (37.4 C)  TempSrc: Oral Oral    Resp: $Remo'15 16 16 17  'aVlNu$ Height:      Weight:    88.134 kg (194 lb 4.8 oz)  SpO2: 100% 100% 97% 98%    Last BM Date: 12/11/14  Intake/Output   Yesterday:  09/20 0701 - 09/21 0700 In: 800 [I.V.:800] Out: 1400 [Urine:1400] This shift:  Total I/O In: 360 [P.O.:360] Out: 450 [Urine:450]   Physical Exam: General: Pt awake/alert/oriented x4 in no acute distress  Abdomen: Soft.  Nondistended.  Mildly tender at incisions only.  No evidence of peritonitis.  No incarcerated hernias.    Problem List:   Principal Problem:   Cholelithiasis with acute and chronic cholecystitis without biliary obstruction Active Problems:   Type 2 diabetes mellitus with neurological manifestations   Essential hypertension   Atrial flutter   Hepatitis C infection   Subclinical hypothyroidism    Results:   Labs: Results for orders placed or performed during the hospital encounter of 12/10/14 (from the past 48 hour(s))  Glucose, capillary     Status: None   Collection Time: 12/11/14 11:59 AM  Result Value Ref Range   Glucose-Capillary 88 65 - 99 mg/dL  Glucose, capillary     Status: Abnormal   Collection Time: 12/11/14  4:44 PM  Result Value Ref Range   Glucose-Capillary 138 (H) 65 - 99 mg/dL  Hepatitis A antibody, total     Status: None   Collection Time: 12/11/14  5:24 PM  Result Value Ref Range   Hep A Total Ab Negative Negative   Comment: (NOTE) Performed At: [facility] [place], [place] [id number] Lindon Romp MD [id number]   Hepatitis B core antibody, total     Status: Abnormal   Collection Time: 12/11/14  5:24 PM  Result Value Ref Range   Hep B Core Total Ab Positive (A) Negative    Comment: (NOTE) Performed At: [facility] [place], [place] [id number] Lindon Romp MD ID:[id number]   Hepatitis B surface antibody     Status: None   Collection Time: 12/11/14  5:24 PM  Result Value Ref Range   Hep B S Ab Reactive     Comment: (NOTE)              Non Reactive: Inconsistent with immunity,                            less than 10 mIU/mL              Reactive:     Consistent with immunity,                            greater than 9.9 mIU/mL Performed At: [facility] [place],  [place] [id number] Lindon Romp MD [id number]   Hepatitis B surface antigen     Status: None   Collection Time: 12/11/14  5:24 PM  Result Value Ref Range   Hepatitis B Surface Ag Negative Negative    Comment: (NOTE) Performed At: [facility] [place], [place] [id number] Lindon Romp MD [id number]   Surgical pcr screen     Status: None   Collection Time: 12/11/14  8:01 PM  Result Value Ref Range   MRSA, PCR NEGATIVE NEGATIVE   Staphylococcus aureus NEGATIVE NEGATIVE    Comment:        The Xpert SA Assay (FDA approved for NASAL specimens in patients over 11 years of age), is one component of a comprehensive surveillance program.  Test performance has been validated by [facility] for patients greater than or equal to 77 year old. It is not intended to diagnose infection nor to guide or monitor treatment.   Glucose, capillary     Status: None   Collection Time: 12/11/14  9:57 PM  Result Value Ref Range   Glucose-Capillary 90 65 - 99 mg/dL   Comment 1 Notify RN    Comment 2 Document in Chart   CBC      Status: Abnormal   Collection Time: 12/12/14  5:06 AM  Result Value Ref Range   WBC 7.0 4.0 - 10.5 K/uL   RBC 3.64 (L) 4.22 - 5.81 MIL/uL   Hemoglobin 8.5 (L) 13.0 - 17.0 g/dL   HCT 26.5 (L) 39.0 - 52.0 %   MCV 72.8 (L) 78.0 - 100.0 fL   MCH 23.4 (L) 26.0 - 34.0 pg   MCHC 32.1 30.0 - 36.0 g/dL   RDW 15.5 11.5 - 15.5 %   Platelets 126 (L) 150 - 400 K/uL  Comprehensive metabolic panel     Status: Abnormal   Collection Time: 12/12/14  5:06 AM  Result Value Ref Range   Sodium 141 135 - 145 mmol/L   Potassium 5.0 3.5 - 5.1 mmol/L   Chloride 117 (H) 101 - 111 mmol/L   CO2 17 (L) 22 - 32 mmol/L   Glucose, Bld 86 65 - 99 mg/dL   BUN 41 (H) 6 - 20 mg/dL   Creatinine, Ser 2.80 (H) 0.61 - 1.24 mg/dL   Calcium 7.9 (L) 8.9 - 10.3 mg/dL   Total Protein 6.1 (L) 6.5 - 8.1 g/dL   Albumin 2.4 (L) 3.5 - 5.0 g/dL   AST 27 15 - 41 U/L   ALT 16 (L) 17 - 63 U/L   Alkaline Phosphatase 65 38 - 126 U/L   Total Bilirubin 0.3 0.3 - 1.2 mg/dL   GFR calc non Af Amer 21 (L) >60 mL/min   GFR calc Af Amer 25 (L) >60 mL/min    Comment: (NOTE) The eGFR has been calculated using the CKD EPI equation. This calculation has not been validated in all clinical situations. eGFR's persistently <60 mL/min signify possible Chronic Kidney Disease.    Anion gap 7 5 - 15  APTT     Status: Abnormal   Collection Time: 12/12/14  5:06 AM  Result Value Ref Range   aPTT 39 (H) 24 - 37 seconds    Comment:        IF BASELINE aPTT IS ELEVATED, SUGGEST PATIENT RISK ASSESSMENT BE USED TO DETERMINE APPROPRIATE ANTICOAGULANT THERAPY.   Protime-INR     Status: Abnormal   Collection Time: 12/12/14  5:06 AM  Result Value Ref  Range   Prothrombin Time 17.2 (H) 11.6 - 15.2 seconds   INR 1.39 0.00 - 1.49  TSH     Status: Abnormal   Collection Time: 12/12/14  5:06 AM  Result Value Ref Range   TSH 15.115 (H) 0.350 - 4.500 uIU/mL  T3 uptake     Status: None   Collection Time: 12/12/14  5:06 AM  Result Value Ref Range   T3  Uptake Ratio 39 24 - 39 %    Comment: (NOTE) Performed At: [facility] [place], [place] [id number] Lindon Romp MD [id number]   Glucose, capillary     Status: None   Collection Time: 12/12/14  7:12 AM  Result Value Ref Range   Glucose-Capillary 89 65 - 99 mg/dL  Glucose, capillary     Status: Abnormal   Collection Time: 12/12/14 11:18 AM  Result Value Ref Range   Glucose-Capillary 112 (H) 65 - 99 mg/dL   Comment 1 Notify RN   T4, free     Status: None   Collection Time: 12/12/14  4:01 PM  Result Value Ref Range   Free T4 0.75 0.61 - 1.12 ng/dL  Glucose, capillary     Status: Abnormal   Collection Time: 12/12/14  4:57 PM  Result Value Ref Range   Glucose-Capillary 143 (H) 65 - 99 mg/dL  Glucose, capillary     Status: None   Collection Time: 12/12/14  9:17 PM  Result Value Ref Range   Glucose-Capillary 69 65 - 99 mg/dL   Comment 1 Notify RN    Comment 2 Document in Chart   Glucose, capillary     Status: Abnormal   Collection Time: 12/12/14 10:14 PM  Result Value Ref Range   Glucose-Capillary 139 (H) 65 - 99 mg/dL   Comment 1 Notify RN   Glucose, capillary     Status: None   Collection Time: 12/13/14  1:47 AM  Result Value Ref Range   Glucose-Capillary 89 65 - 99 mg/dL   Comment 1 Notify RN    Comment 2 Document in Chart   Basic metabolic panel     Status: Abnormal   Collection Time: 12/13/14  4:03 AM  Result Value Ref Range   Sodium 143 135 - 145 mmol/L   Potassium 5.4 (H) 3.5 - 5.1 mmol/L   Chloride 118 (H) 101 - 111 mmol/L   CO2 19 (L) 22 - 32 mmol/L   Glucose, Bld 94 65 - 99 mg/dL   BUN 37 (H) 6 - 20 mg/dL   Creatinine, Ser 2.83 (H) 0.61 - 1.24 mg/dL   Calcium 8.0 (L) 8.9 - 10.3 mg/dL   GFR calc non Af Amer 21 (L) >60 mL/min   GFR calc Af Amer 24 (L) >60 mL/min    Comment: (NOTE) The eGFR has been calculated using the CKD EPI equation. This calculation has not been validated in all clinical situations. eGFR's persistently <60  mL/min signify possible Chronic Kidney Disease.    Anion gap 6 5 - 15  Magnesium     Status: None   Collection Time: 12/13/14  4:03 AM  Result Value Ref Range   Magnesium 1.7 1.7 - 2.4 mg/dL  CBC     Status: Abnormal   Collection Time: 12/13/14  4:03 AM  Result Value Ref Range   WBC 8.3 4.0 - 10.5 K/uL   RBC 3.71 (L) 4.22 - 5.81 MIL/uL   Hemoglobin 8.5 (L) 13.0 - 17.0 g/dL   HCT 27.0 (L)  39.0 - 52.0 %   MCV 72.8 (L) 78.0 - 100.0 fL   MCH 22.9 (L) 26.0 - 34.0 pg   MCHC 31.5 30.0 - 36.0 g/dL   RDW 38.3 (H) 33.8 - 32.9 %   Platelets 155 150 - 400 K/uL  Glucose, capillary     Status: None   Collection Time: 12/13/14  7:14 AM  Result Value Ref Range   Glucose-Capillary 99 65 - 99 mg/dL    Imaging / Studies: Dg Cholangiogram Operative  12/12/2014   CLINICAL DATA:  69 year old male with a history of laparoscopic cholecystectomy.  EXAM: INTRAOPERATIVE CHOLANGIOGRAM  TECHNIQUE: Cholangiographic images from the C-arm fluoroscopic device were submitted for interpretation post-operatively. Please see the procedural report for the amount of contrast and the fluoroscopy time utilized.  COMPARISON:  12/10/2014, CT 12/10/2014  FINDINGS: Surgical instruments project over the upper abdomen.  There is cannulation of the cystic duct/gallbladder neck, with antegrade infusion of contrast. Caliber of the extrahepatic ductal system within normal limits.  Vague ill-defined filling defects in the distal common bile duct mobile on injection.  Free flow of contrast across the ampulla.  IMPRESSION: Intraoperative cholangiogram demonstrates extrahepatic biliary ducts of unremarkable caliber, with vague mobile filling defects in the distal common bile duct, potentially debris, stones, or air. Free flow of contrast across the ampulla.  Please refer to the dictated operative report for full details of intraoperative findings and procedure  Signed,  Yvone Neu. Loreta Ave, DO  [facility]   [facility]   Electronically Signed   By: Gilmer Mor D.O.   On: 12/12/2014 13:05    Medications / Allergies:  Scheduled Meds: . amiodarone  200 mg Oral Daily  . amLODipine  10 mg Oral Daily  . carvedilol  12.5 mg Oral BID WC  . docusate sodium  100 mg Oral BID  . heparin subcutaneous  5,000 Units Subcutaneous 3 times per day  . hepatitis A virus (PF) vaccine  1 mL Intramuscular Once  . insulin aspart  0-15 Units Subcutaneous TID WC  . pantoprazole  40 mg Oral Daily  . pneumococcal 23 valent vaccine  0.5 mL Intramuscular Tomorrow-1000   Continuous Infusions: . sodium chloride 10 mL/hr at 12/12/14 0900   PRN Meds:.fluticasone, hydroxypropyl methylcellulose / hypromellose, morphine injection, ondansetron **OR** ondansetron (ZOFRAN) IV, oxyCODONE, phenol, promethazine, simethicone  Antibiotics: Anti-infectives    Start     Dose/Rate Route Frequency Ordered Stop   12/12/14 1000  cefTRIAXone (ROCEPHIN) 2 g in dextrose 5 % 50 mL IVPB  Status:  Discontinued     2 g 100 mL/hr over 30 Minutes Intravenous To Surgery 12/12/14 0956 12/12/14 0959   12/11/14 1600  cefTRIAXone (ROCEPHIN) 2 g in dextrose 5 % 50 mL IVPB  Status:  Discontinued     2 g 100 mL/hr over 30 Minutes Intravenous Every 24 hours 12/10/14 1953 12/12/14 1353   12/10/14 1900  piperacillin-tazobactam (ZOSYN) IVPB 2.25 g  Status:  Discontinued     2.25 g 100 mL/hr over 30 Minutes Intravenous 4 times per day 12/10/14 1753 12/10/14 1809   12/10/14 1830  cefTRIAXone (ROCEPHIN) 2 g in dextrose 5 % 50 mL IVPB     2 g 100 mL/hr over 30 Minutes Intravenous  Once 12/10/14 1809 12/10/14 1922   12/10/14 1630  piperacillin-tazobactam (ZOSYN) IVPB 3.375 g  Status:  Discontinued     3.375 g 100 mL/hr over 30 Minutes Intravenous  Once 12/10/14 1624 12/10/14 1809  Assessment/Plan Acute calculous cholecystitis  POD#1 laparoscopic cholecystectomy with IOC---Dr. Redmond Pulling -pain well controlled, ambulating at  baseline(walker, prosthetic), tolerating POs, afebrile and VSS.  Stable for discharge from a surgical standpoint.  Follow up has been arranged and instructions provided. Warning signs that warrant further evalaution discussed. Would recommend a small dose of pain meds.  Resume Apixaban 1 week post op.  Please call with further questions.   Erby Pian, [facility] Pager [phone]) For consults and floor pages call [phone](7A-4:30P)  12/13/2014 11:06 AM

## 2014-12-13 NOTE — Progress Notes (Signed)
Subjective: [name] [name] is doing well this morning. He reports some mild abdominal soreness but no overt pain. No nausea or vomiting.    Objective: Vital signs in last 24 hours: Filed Vitals:   12/12/14 1410 12/12/14 2126 12/13/14 0153 12/13/14 0532  BP: 182/91 135/81 149/75 142/84  Pulse: 64 64 67 69  Temp: 97.7 F (36.5 C) 98.3 F (36.8 C) 99.3 F (37.4 C) 99.4 F (37.4 C)  TempSrc: Oral Oral    Resp: Height:      Weight:    194 lb 4.8 oz (88.134 kg)  SpO2: 100% 100% 97% 98%   Weight change: 7 lb 5.8 oz (3.339 kg)  Intake/Output Summary (Last 24 hours) at 12/13/14 1610 Last data filed at 12/13/14 0533  Gross per 24 hour  Intake    800 ml  Output   1400 ml  Net   -600 ml   Physical Exam Gen: Well developed, well nourished male lying in bed, in no distress CV: Regular rate and rhythm, no murmurs Lungs: clear to auscultation bilaterally, no wheezes or crackles Abd: Soft, nondistended, nontender, negative Murphy sign, normal bowel sounds. Surgical sites covered with bandages. No obvious blood or drainage. Ext: Well-perfused, normal joints, no edema, Right AKA Skin: Warm, no rashes Neuro: Alert and oriented x3   Medications: I have reviewed the patient's current medications. Scheduled Meds: . amiodarone  200 mg Oral Daily  . amLODipine  10 mg Oral Daily  . carvedilol  12.5 mg Oral BID WC  . docusate sodium  100 mg Oral BID  . heparin subcutaneous  5,000 Units Subcutaneous 3 times per day  . hepatitis A virus (PF) vaccine  1 mL Intramuscular Once  . insulin aspart  0-15 Units Subcutaneous TID WC  . pantoprazole  40 mg Oral Daily  . pneumococcal 23 valent vaccine  0.5 mL Intramuscular Tomorrow-1000   Continuous Infusions: . sodium chloride 10 mL/hr at 12/12/14 0900   PRN Meds:.fluticasone, hydroxypropyl methylcellulose / hypromellose, morphine injection, ondansetron **OR** ondansetron (ZOFRAN) IV, oxyCODONE, phenol, promethazine,  simethicone Assessment/Plan: Principal Problem:   Cholelithiasis with acute and chronic cholecystitis without biliary obstruction Active Problems:   Type 2 diabetes mellitus with neurological manifestations   Essential hypertension   Atrial flutter   Hepatitis C infection   Subclinical hypothyroidism  Cholelithiasis with possible cholecystitis without biliary obstruction: s/p cholecystectomy done 9/20. No overt pain, only having some mild soreness at the surgical sites. No overt bleeding or drainage.   - D/C Ceftriaxone today - Phenergan 12.5 mg IV q6hr prn - Holding Apixaban for 7 days from surgery - PT to evaluate today - Norco 5-10 mg q6hr prn - D/C today pending PT and surgery recs - F/u with surgery  - F/u with [facility] clinic in 1 week  CAD: s/p DES to RCA in 2006. No chest pain or SOB. Cleared by cardiology for surgery. - Coninue pravastain 40 mg qhs and carvedilol 12.5 mg bid.  Atrial Flutter: Had ablation scheduled for 9/19. Will have to reschedule to follow up outpatient. Anticoagulated with Apixipan at home, holding for 5 days post-surgery. CHADS-VASC score of 4. Rate controlled on amiodarone and carvedilol.  - Amiodarone 200 mg daily and carvediolol 12.5 mg bid  HCV Ab positive: Hepatic function panel wnl.  - HCV RNA quant 4.7 million, chronic HCV infection - [place] with elastography today with follow up as an outpatient with [facility] - Not immunized against Hep A, will give Hep  A vaccine here and will need another dose in 6 months - Immunity to Hep B from previous infection  Elevated TSH: TSH 15. Free T4 normal at 0.75. Will check Total T3 level. If T3 level is low will start levothyroxine, likely 2/2 HCV. If normal then will not start any treatment for subclinical hypothyroidism in the setting of acute illness and will need reassessment in 3 months.   DM type 2: CBGs well controlled. Hgb A1c 6.3. - Continue SSI - CBGs qac and qhs  HTN: BP improved today after  restarting home Amlodpine.  - Continue Carvedilol 12.5 mg bid.   - Continue Amlodipine  daily  DVT PPx: Heparin  Dispo: Likely discharge later today  The patient does have a current PCP Doneen Poisson, MD) and does need an [facility] hospital follow-up appointment after discharge.  The patient does not have transportation limitations that hinder transportation to clinic appointments.  .Services Needed at time of discharge: Y = Yes, Blank = No PT:   OT:   RN:   Equipment:   Other:     LOS: 3 days   Valentino Nose, MD IMTS PGY-1 [phone] 12/13/2014, 6:38 AM

## 2014-12-13 NOTE — Discharge Summary (Signed)
Name: [name] [name] MRN: [id number] DOB: 1945/07/18 69 y.o. PCP: Oval Linsey, MD  Date of Admission: 12/10/2014 10:23 AM Date of Discharge: 12/14/2014 Attending Physician: Lalla Brothers, MD  Discharge Diagnosis: Principal Problem:   Cholelithiasis with acute and chronic cholecystitis without biliary obstruction Active Problems:   Type 2 diabetes mellitus with neurological manifestations   Essential hypertension   Atrial flutter   Hepatitis C infection   Subclinical hypothyroidism  Discharge Medications:   Medication List    STOP taking these medications        apixaban 5 MG Tabs tablet  Commonly known as:  ELIQUIS      TAKE these medications        ACCU-CHEK AVIVA PLUS test strip  Generic drug:  glucose blood  1 strip by Does not apply route 2 (two) times daily.     ACCU-CHEK AVIVA PLUS W/DEVICE Kit  1 cartridge by Does not apply route as needed.     ACCU-CHEK AVIVA Soln  1 drop by Does not apply route as needed.     ACCU-CHEK SOFTCLIX LANCETS lancets  1 each by Other route 2 (two) times daily.     amiodarone 200 MG tablet  Commonly known as:  PACERONE  Take 1 tablet (200 mg total) by mouth daily.     amLODipine 10 MG tablet  Commonly known as:  NORVASC  Take 10 mg by mouth daily.     calcium citrate-vitamin D 315-200 MG-UNIT per tablet  Commonly known as:  CITRACAL+D  Take 1 tablet by mouth daily.     carvedilol 12.5 MG tablet  Commonly known as:  COREG  Take 1 tablet (12.5 mg total) by mouth 2 (two) times daily with a meal.     docusate sodium 100 MG capsule  Commonly known as:  COLACE  Take 100 mg by mouth 2 (two) times daily as needed (constipation).     famotidine 20 MG tablet  Commonly known as:  PEPCID  Take 1 tablet (20 mg total) by mouth 2 (two) times daily.     fish oil-omega-3 fatty acids 1000 MG capsule  Take 1 g by mouth daily.     fluticasone 50 MCG/ACT nasal spray  Commonly known as:  FLONASE  Place 2 sprays into both  nostrils daily as needed for allergies.     furosemide 40 MG tablet  Commonly known as:  LASIX  Take 40 mg by mouth daily as needed for fluid. Take 1 tablet by mouth daily as needed for a weight gain of more than 5 lbs in a week.     hydroxypropyl methylcellulose / hypromellose 2.5 % ophthalmic solution  Commonly known as:  ISOPTO TEARS / GONIOVISC  Place 2 drops into both eyes 4 (four) times daily as needed for dry eyes.     insulin aspart protamine - aspart (70-30) 100 UNIT/ML FlexPen  Commonly known as:  NOVOLOG MIX 70/30 FLEXPEN  Inject 0.1 mLs (10 Units total) into the skin 2 (two) times daily with a meal.     Insulin Pen Needle 30G X 8 MM Misc  Commonly known as:  NOVOFINE  Inject SQ as directed twice daily, use new pen needle for each injection     multivitamin with minerals tablet  Take 1 tablet by mouth daily.     oxyCODONE 5 MG immediate release tablet  Commonly known as:  Oxy IR/ROXICODONE  Take 1-2 tablets (5-10 mg total) by mouth every 6 (six) hours as needed for  moderate pain.     pravastatin 40 MG tablet  Commonly known as:  PRAVACHOL  Take 1 tablet (40 mg total) by mouth every evening.     terazosin 10 MG capsule  Commonly known as:  HYTRIN  Take 1 capsule (10 mg total) by mouth at bedtime.       Disposition and follow-up:   [name] was discharged from [facility] in Good condition.  At the hospital follow up visit please address:  1. [name] underwent a lap chole. Please assess for any post surgical signs or symptoms of infection or continued nausea/vomiting. 2. Hepatitis C infection. Please schedule US Hepatic Elastography and follow up with Infectious Disease. Received Hep A vaccine during hospitalization, will need 2nd round in 6 months. Immunized to Hep B. 3. Held Apixaban following surgery. Please assess for any actively bleeding and restart Apixaban. 4. TSH 15 with normal free T4 and total T3. Consider starting  treatment vs rechecking in 3 months.  Follow-up Appointments: Follow-up Information    Follow up with [facility] On 01/02/2015.   Specialty:  General Surgery   Why:  [facility], 1:45pm, arrive no later than 1:15pm for paperwork   Contact information:   [address] [place] [address] [phone]       Follow up with [name] Peru, MD.   Specialty:  Cardiology   Why:  Call to reschedule your ablation   Contact information:   [address] [address] [phone]       Follow up with [facility] On 12/20/2014.   Why:  @ 9:45 AM   Contact information:   [address] [place] [phone]      Discharge Instructions: Discharge Instructions    Diet - low sodium heart healthy    Complete by:  As directed      Increase activity slowly    Complete by:  As directed           Consultations:  Cardiology: [name] Ruths, MD Surgery: [name] Pickerel, MD  Procedures Performed:  Ct Abdomen Pelvis Wo Contrast  12/10/2014   CLINICAL DATA:  Nausea and vomiting intermittent that comes on after eating and when laying flat. Pt states hx of acid reflux  EXAM: CT ABDOMEN AND PELVIS WITHOUT CONTRAST  TECHNIQUE: Multidetector CT imaging of the abdomen and pelvis was performed following the standard protocol without IV contrast.  COMPARISON:  06/10/2010  FINDINGS: Lower chest:  Bilateral lower lobe mild dependent atelectasis  Hepatobiliary: Liver normal without contrast. Innumerable small gallstones. Possible mild gallbladder wall thickening  Pancreas: Normal  Spleen: Normal  Adrenals/Urinary Tract: Progressive significant diffuse bladder wall thickening more so along the left side. Mild left hydroureteronephrosis. Mild progressive perinephric inflammation when compared to prior study. 1 cm low-attenuation area upper pole left kidney possibly a cyst. Right kidney also shows mild  perinephric inflammation and mild prominence of the ureter, similar but slightly less prominent when compared to the contralateral side.  Stomach/Bowel: Stool throughout the colon and into the rectum  Vascular/Lymphatic: Extensive aortoiliac atherosclerotic calcification  Reproductive: Prostatic fossa largely obscured by beam attenuation artifact from left hip replacement  Other: No free fluid  Musculoskeletal: No acute findings. Chronic compression deformity L3 and L4.  IMPRESSION: 1. Cholelithiasis. Possible mild gallbladder wall thickening. Correlate clinically and consider right upper quadrant ultrasound if indicated. 2. Progressive bladder wall thickening suggesting  hypertrophy from outlet obstruction, although cystitis or infiltrating mass not entirely excluded. This appears to be U related to bilateral mild ureteral dilatation which could reflect an element of obstruction or reflux. There is mild progressive perinephric inflammation. Urinary tract infection is not excluded.   Electronically Signed   By: Skipper Cliche M.D.   On: 12/10/2014 12:46   Dg Cholangiogram Operative  12/12/2014   CLINICAL DATA:  69 year old male with a history of laparoscopic cholecystectomy.  EXAM: INTRAOPERATIVE CHOLANGIOGRAM  TECHNIQUE: Cholangiographic images from the C-arm fluoroscopic device were submitted for interpretation post-operatively. Please see the procedural report for the amount of contrast and the fluoroscopy time utilized.  COMPARISON:  12/10/2014, CT 12/10/2014  FINDINGS: Surgical instruments project over the upper abdomen.  There is cannulation of the cystic duct/gallbladder neck, with antegrade infusion of contrast. Caliber of the extrahepatic ductal system within normal limits.  Vague ill-defined filling defects in the distal common bile duct mobile on injection.  Free flow of contrast across the ampulla.  IMPRESSION: Intraoperative cholangiogram demonstrates extrahepatic biliary ducts of unremarkable  caliber, with vague mobile filling defects in the distal common bile duct, potentially debris, stones, or air. Free flow of contrast across the ampulla.  Please refer to the dictated operative report for full details of intraoperative findings and procedure  Signed,  Dulcy Fanny. Earleen Newport, DO  [facility]  [facility]   Electronically Signed   By: Corrie Mckusick D.O.   On: 12/12/2014 13:05   US Abdomen Limited Ruq  12/10/2014   CLINICAL DATA:  Right upper quadrant pain for 1 week  EXAM: US ABDOMEN LIMITED - RIGHT UPPER QUADRANT  COMPARISON:  None.  FINDINGS: Gallbladder:  Multiple mobile gallstones the largest measuring 7 mm. No Murphy's sign. Wall of the gallbladder is thickened to 7 mm.  Common bile duct:  Diameter: 2 mm  Liver:  No focal lesion identified. Within normal limits in parenchymal echogenicity.  Incidental note of right renal cysts.  IMPRESSION: Cholelithiasis and gallbladder wall thickening concerning for the possibility of cholecystitis, although there is no sonographic Murphy's sign.   Electronically Signed   By: Skipper Cliche M.D.   On: 12/10/2014 14:01   Admission HPI:  Mr. Can is a 69 year old male with a PMH of HTN, HLD, DM type 2 with right AKA, A. FIB on Eliquis, GERD presents to [facility] ED with 2-3 day history of nausea and vomiting. He reports that the past 2-3 days he has been having nausea that has been waking him up at night. He thought it was related to his GERD at first but was unimproved with treatment. He then began to have emesis and came to the ED for further evaluation. Symptoms are not associated with eating. He denies any fevers, chills, abdominal pain, constipation, diarrhea. He got a CT abdomen without contrast in the ED which was concerning for cholelithiasis with gallbladder wall thickening. RUQ ultrasound revealed a multiple mobile gallstone with the largest measuring 7 mm. Gallbladder wall thickened to 7 mm. Negative Murphy's  sign.   Hospital Course by problem list: Principal Problem:   Cholelithiasis with acute and chronic cholecystitis without biliary obstruction Active Problems:   Type 2 diabetes mellitus with neurological manifestations   Essential hypertension   Atrial flutter   Hepatitis C infection   Subclinical hypothyroidism   1. Cholelithiasis with acute and chronic cholecystitis w/o biliary obstruction: Mr. Jodoin presented complaining of 3 day history of nausea and vomiting. No fevers, RUQ  pain or elevated WBC. Ultrasound showed 7 mm thickened gallbladder wall with multiple mobile gallstones with the largest measuring 84mm. He received 3 days of ceftriaxone. Following clearance by cardiology, patient underwent elective cholecystectomy on 12/12/14. Patient tolerated surgery well. At discharge patient was tolerating solid foods, passing flatus and voiding spontaneously. Discharged in stable condition with close follow up at [facility] on 9/28 and surgery on 10/11.  2. DM2: Patients glucose levels were adequately maintained on sliding scale insulin. Discharge on his home medications:  3. Essential HTN: HTN adequately managed with amlodipine and carvedilol. Continue home meds at discharge.  4. Atrial Flutter: Patient was scheduled for ablation, but cancelled due to hospitalization. Managed at home with apixaban, which was held during hospital stay. AF was managed with amiodarone and carvedilol during hospital stay. Surgery recommends holding apixaban for 5-7 days following surgery, preferably 7. Will hold apixaban at discharge to be resumed at hospital follow up.   5. Chronic Hepatitis C: Patient found to Hep C Ab positive. Subsequent RNA PCR revealed active infection. Ascites and mild liver nodularity was noted during surgery. As per radiology, an [place] w/ hepatic elastography is indicated, but should postpone for several weeks to avoid false positives. He is immunized to Hep B from previous infection. Received  Hepatitis A vaccine here. Will need second round in 6 months.  6. Subclinical hypthyroidism 2/2 to Hep C infection vs Amiodarone: Patient found to have and elevated TSH at 15. Subsequent testing of free T4 was normal. Total T3 was normal. Did not start on any medications during hospitalization. Consider starting levothyroxine at follow up for TSH >10 vs repeat testing in 3 months.  Discharge Vitals:   BP 154/86 mmHg  Pulse 66  Temp(Src) 99.1 F (37.3 C) (Oral)  Resp 17  Ht $R'5\' 8"'BC$  (1.727 m)  Wt 194 lb 4.8 oz (88.134 kg)  BMI 29.55 kg/m2  SpO2 97%  Discharge Labs:  No results found for this or any previous visit (from the past 24 hour(s)).  Signed: Maryellen Pile, MD 12/14/2014, 5:07 PM

## 2014-12-13 NOTE — Care Management Important Message (Signed)
Important Message  Patient Details  Name: [name] [name] MRN: [id number] Date of Birth: [birth year]   Medicare Important Message Given:  Yes-second notification given    Orson Aloe 12/13/2014, 11:56 AM

## 2014-12-13 NOTE — Progress Notes (Signed)
Vomited X 1, moderate bile looking vomitus. Zofran given.

## 2014-12-13 NOTE — Progress Notes (Signed)
Discharge home. Home discharge instruction given, no question verbalized, 

## 2014-12-14 LAB — THYROID PEROXIDASE ANTIBODY: THYROID PEROXIDASE ANTIBODY: 17 [IU]/mL (ref 0–34)

## 2014-12-14 LAB — T3: T3 TOTAL: 107 ng/dL (ref 71–180)

## 2014-12-18 ENCOUNTER — Telehealth: Payer: Self-pay | Admitting: *Deleted

## 2014-12-18 NOTE — Telephone Encounter (Signed)
Pt called for HFU Transition of Care - no answer, message left.

## 2014-12-19 ENCOUNTER — Telehealth: Payer: Self-pay | Admitting: *Deleted

## 2014-12-19 NOTE — Telephone Encounter (Signed)
Transition Care Management Follow-up Telephone Call   Date discharged   12/14/14   How have you been since you were released from the hospital?  Doing well.   Do you understand why you were in the hospital? Yes   Do you understand the discharge instructions? Yes   Where were you discharged to? Home   Items Reviewed:  Medications reviewed: No, will bring tomorrow  Allergies reviewed:  No  Dietary changes reviewed: No  Referrals reviewed: No   Functional Questionnaire:   Activities of Daily Living (ADLs):  ok He states they are independent in the following: all States they require assistance with the following: none   Any transportation issues/concerns? No   Any patient concerns? No   Confirmed importance and date/time of follow-up visits scheduled Yes, tomorrow  Provider Appointment booked with  Confirmed with patient if condition begins to worsen call PCP or go to the ER.  Patient was given the office number and encouraged to call back with question or concerns.  Yes.

## 2014-12-20 ENCOUNTER — Ambulatory Visit: Payer: Medicare PPO | Admitting: Nurse Practitioner

## 2014-12-20 ENCOUNTER — Encounter: Payer: Self-pay | Admitting: Internal Medicine

## 2014-12-20 ENCOUNTER — Ambulatory Visit (INDEPENDENT_AMBULATORY_CARE_PROVIDER_SITE_OTHER): Payer: Medicare PPO | Admitting: Internal Medicine

## 2014-12-20 VITALS — BP 146/72 | HR 65 | Temp 98.0°F | Ht 68.0 in | Wt 203.9 lb

## 2014-12-20 DIAGNOSIS — E038 Other specified hypothyroidism: Secondary | ICD-10-CM

## 2014-12-20 DIAGNOSIS — K8012 Calculus of gallbladder with acute and chronic cholecystitis without obstruction: Secondary | ICD-10-CM

## 2014-12-20 DIAGNOSIS — B182 Chronic viral hepatitis C: Secondary | ICD-10-CM

## 2014-12-20 DIAGNOSIS — Z09 Encounter for follow-up examination after completed treatment for conditions other than malignant neoplasm: Secondary | ICD-10-CM | POA: Diagnosis not present

## 2014-12-20 DIAGNOSIS — E039 Hypothyroidism, unspecified: Secondary | ICD-10-CM

## 2014-12-20 DIAGNOSIS — Z9049 Acquired absence of other specified parts of digestive tract: Secondary | ICD-10-CM | POA: Diagnosis not present

## 2014-12-20 DIAGNOSIS — I10 Essential (primary) hypertension: Secondary | ICD-10-CM

## 2014-12-20 LAB — GLUCOSE, CAPILLARY: Glucose-Capillary: 157 mg/dL — ABNORMAL HIGH (ref 65–99)

## 2014-12-20 NOTE — Progress Notes (Signed)
Patient ID: [name] [name], male   DOB: 1945/12/30, 69 y.o.   MRN: [id number]   Subjective:   Patient ID: [name] [name] male   DOB: 26-Mar-1945 69 y.o.   MRN: [id number]  HPI: [name] is a 69 y.o. with PMH listed below, presented today for hospital follow up, was admitted- 9/18 to 12/13/2014, was managed for cholitiasis with acute cholecystitis. He has Lap cholecystectomy on admission- 9/20, was treated with 3 days of antibiortics, and has been doing well. His Eliquis was held for his Atria fib, surgery recommended starting it 7 days after his surgery. He is doing very well and has no complaints today. Please see assessment and plan for status on his medical condition.   Past Medical History  Diagnosis Date  . Hyperlipidemia LDL goal < 100 04/02/2006  . Essential hypertension 12/09/2006  . Benign prostatic hypertrophy with nocturia 07/07/2006  . Chronic venous insufficiency 04/12/2010  . Obesity (BMI 30.0-34.9) 01/15/2012  . Constipation 05/25/2009    Intermittent   . Microcytic normochromic anemia 05/27/2006  . Internal and external hemorrhoids without complication 11/16/4156  . Coronary artery disease 04/02/2006    s/p RCA DES 2006, low risk myoview in 2011  . 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 Type 2 diabetes mellitus with stage 3 chronic kidney disease   . Closed displaced fracture of left femoral neck 06/14/2014    s/p left hip hemiarthroplasty June 14, 2014   . Atrial flutter 07/26/2014    May 2016, CHA2DS2VASc = 4 -> Eliquis, spontaneous conversion to NSR   . Degenerative joint disease involving multiple joints 02/02/2007  . GERD (gastroesophageal  reflux disease)    Current Outpatient Prescriptions  Medication Sig Dispense Refill  . ACCU-CHEK AVIVA PLUS test strip 1 strip by Does not apply route 2 (two) times daily.    [name] ACCU-CHEK SOFTCLIX LANCETS lancets 1 each by Other route 2 (two) times daily.    [name] amiodarone (PACERONE) 200 MG tablet Take 1 tablet (200 mg total) by mouth daily. 90 tablet 3  . amLODipine (NORVASC) 10 MG tablet Take 10 mg by mouth daily.    . Blood Glucose Calibration (ACCU-CHEK AVIVA) SOLN 1 drop by Does not apply route as needed.    . Blood Glucose Monitoring Suppl (ACCU-CHEK AVIVA PLUS) W/DEVICE KIT 1 cartridge by Does not apply route as needed.    . calcium citrate-vitamin D (CITRACAL+D) 315-200 MG-UNIT per tablet Take 1 tablet by mouth daily.     . carvedilol (COREG) 12.5 MG tablet Take 1 tablet (12.5 mg total) by mouth 2 (two) times daily with a meal. 180 tablet 3  . docusate sodium (COLACE) 100 MG capsule Take 100 mg by mouth 2 (two) times daily as needed (constipation).     . famotidine (PEPCID) 20 MG tablet Take 1 tablet (20 mg total) by mouth 2 (two) times daily. (Patient taking differently: Take 20 mg by mouth 2 (two) times daily as needed for heartburn. ) 60 tablet 0  . fish oil-omega-3 fatty acids 1000 MG capsule Take 1 g by mouth daily.    . fluticasone (FLONASE) 50 MCG/ACT nasal spray Place 2  sprays into both nostrils daily as needed for allergies. 16 g 11  . furosemide (LASIX) 40 MG tablet Take 40 mg by mouth daily as needed for fluid. Take 1 tablet by mouth daily as needed for a weight gain of more than 5 lbs in a week.    . hydroxypropyl methylcellulose (ISOPTO TEARS) 2.5 % ophthalmic solution Place 2 drops into both eyes 4 (four) times daily as needed for dry eyes.    . insulin aspart protamine - aspart (NOVOLOG MIX 70/30 FLEXPEN) (70-30) 100 UNIT/ML FlexPen Inject 0.1 mLs (10 Units total) into the skin 2 (two) times daily with a meal. (Patient taking differently: Inject 5-15 Units into the skin 2 (two)  times daily with a meal. 120-130 = 0 units, ~250 = 5 units, 260-300 = 10 units, >300 = 15 units) 18 pen 3  . Insulin Pen Needle (NOVOFINE) 30G X 8 MM MISC Inject SQ as directed twice daily, use new pen needle for each injection 2 each 11  . Multiple Vitamins-Minerals (MULTIVITAMIN WITH MINERALS) tablet Take 1 tablet by mouth daily.    [name] oxyCODONE (OXY IR/ROXICODONE) 5 MG immediate release tablet Take 1-2 tablets (5-10 mg total) by mouth every 6 (six) hours as needed for moderate pain. 40 tablet 0  . pravastatin (PRAVACHOL) 40 MG tablet Take 1 tablet (40 mg total) by mouth every evening. 90 tablet 3  . terazosin (HYTRIN) 10 MG capsule Take 1 capsule (10 mg total) by mouth at bedtime. 90 capsule 3   No current facility-administered medications for this visit.   Family History  Problem Relation Age of Onset  . Hypertension Mother   . Heart attack Father   . Breast cancer Sister   . Arthritis Sister   . Heart attack Brother   . Diabetes Brother   . Stroke Brother   . Pneumonia Daughter   . Diabetes Brother   . Alcoholism Brother   . Diabetes Brother   . Arthritis Sister     Bilateral knee replacement  . HIV Daughter   . Hypertension Daughter   . Drug abuse Daughter   . Schizophrenia Daughter   . Hypertension Daughter   . Drug abuse Daughter   . Bipolar disorder Daughter    Social History   Social History  . Marital Status: Married    Spouse Name: N/A  . Number of Children: N/A  . Years of Education: N/A   Social History Main Topics  . Smoking status: Former Smoker -- 1.00 packs/day for 10 years    Types: Cigarettes    Quit date: 06/16/1968  . Smokeless tobacco: Never Used  . Alcohol Use: Yes     Comment: "quit alcohol in the 1960's"  . Drug Use: No  . Sexual Activity: Yes    Birth Control/ Protection: None   Other Topics Concern  . None   Social History Narrative   Review of Systems: CONSTITUTIONAL- No Fever, weightloss, night sweat or change in appetite. SKIN-  No Rash, colour changes or itching. HEAD- No Headache or dizziness. Mouth/throat- No Sorethroat, dentures, or bleeding gums. RESPIRATORY- No Cough or SOB. CARDIAC- No Palpitations, or chest pain. GI- No vomiting, diarrhoea, abd pain. URINARY- No Frequency, urgency, straining or dysuria. NEUROLOGIC- No syncope, seizures PYSCH- Denies depression or anxiety.  Objective:  Physical Exam: Filed Vitals:   12/20/14 1010  BP: 146/72  Pulse: 65  Temp: 98 F (36.7 C)  TempSrc: Oral  Height: $Remove'5\' 8"'BDUYfkd$  (1.727 m)  Weight: 203 lb 14.4  oz (92.488 kg)  SpO2: 99%   GENERAL- alert, co-operative, appears as stated age, not in any distress. HEENT- Atraumatic, normocephalic, PERRL, oral mucosa appears moist, neck supple. CARDIAC- RRR, no murmurs, rubs or gallops. RESP- Moving equal volumes of air, and clear to auscultation bilaterally, no wheezes or crackles. ABDOMEN- Soft, nontender, some scarring from lap chole- umbilical, epigastric and Righht upper quadrant, healing well, not tender, some sutures not completely resorbed present, no palpable masses or organomegaly, bowel sounds present. BACK- Normal curvature of the spine, No tenderness along the vertebrae, no CVA tenderness. NEURO- Alert and oriented, no obvious focal deficit. EXTREMITIES- has a right lower extremity prosthesis, no pedal edema- left. SKIN- Warm, dry, No rash or lesion. PSYCH- Normal mood and affect, appropriate thought content and speech.  Assessment & Plan:   The patient's case and plan of care was discussed with attending physician, Dr. Lynnae January.  Please see problem based charting for assessment and plan.

## 2014-12-20 NOTE — Assessment & Plan Note (Signed)
TSH was checked on admission- elevated at 15.115- 12/12/2014, and was normal prior to this- 4 months prior at 3.2. Pt today denies any symptoms of hypothyroidism, except for constipation.   TSH was checked on admission in the setting of acute illness, most likely sick thyroid syndrome.  Plan-  Since levels are not greater than 20, will not treat for now. He can have his levels rechecked 6 weeks after last  Was TSH checked.

## 2014-12-20 NOTE — Assessment & Plan Note (Addendum)
Viral load >4 million. Patient to be referred to Infectious diease, but recommendations by radiology on discharge summary 12/13/2014, to get elastography several weeks, after to prevent false positives. Therefore Elastography ws not ordered today. Follow up next visit with elastography and referral to ID. He needs a follow up hep A vaccine in 6 months, a has gotten his hep B vaccine.

## 2014-12-20 NOTE — Assessment & Plan Note (Addendum)
For hopsital follow up today. Was admitted 9/18 - 9/21. Had Lap chole during admission. Doing ok today. Apixaban was held during admission, to be restarted 7 days after  Lap chole.  Plan- Pt instructed to restart Apixaban. - Follow up with Surg 01/02/2015. - Bmet on discharge, K elevated at 5.4, will recheck Bmet today.  Addendum- 12/21/2014- K- 4.3, and Cr at baseline.

## 2014-12-20 NOTE — Patient Instructions (Signed)
You can start taking your Blood thinner Eliquis today.   Please be sure to follow up with your surgeon on the 11th of next month- October.  Fr constipation, you can try Over the counter fiber suppliments, increase vegetables intake also, or you can take Colace every other day or miralax.  We will see you in 4- 6 weeks time, we will need to get some imaging on you.

## 2014-12-21 LAB — BMP8+ANION GAP
Anion Gap: 16 mmol/L (ref 10.0–18.0)
BUN / CREAT RATIO: 15 (ref 10–22)
BUN: 40 mg/dL — ABNORMAL HIGH (ref 8–27)
CHLORIDE: 110 mmol/L — AB (ref 97–108)
CO2: 18 mmol/L (ref 18–29)
Calcium: 7.9 mg/dL — ABNORMAL LOW (ref 8.6–10.2)
Creatinine, Ser: 2.74 mg/dL — ABNORMAL HIGH (ref 0.76–1.27)
GFR calc Af Amer: 26 mL/min/{1.73_m2} — ABNORMAL LOW (ref >59)
GFR calc non Af Amer: 22 mL/min/{1.73_m2} — ABNORMAL LOW (ref >59)
GLUCOSE: 150 mg/dL — AB (ref 65–99)
Potassium: 4.3 mmol/L (ref 3.5–5.2)
SODIUM: 144 mmol/L (ref 134–144)

## 2014-12-21 NOTE — Progress Notes (Signed)
Internal Medicine Clinic Attending  Case discussed with Dr. Emokpae soon after the resident saw the patient.  We reviewed the resident's history and exam and pertinent patient test results.  I agree with the assessment, diagnosis, and plan of care documented in the resident's note. 

## 2015-01-08 NOTE — Progress Notes (Signed)
HPI: FU coronary disease; status post PCI of his right coronary artery with drug- eluting stent in February 2006 and atrial flutter.An abdominal ultrasound in August 2006 showed no aneurysm. Carotid Dopplers in August of 2006 showed probable 0-39% stenosis. His most recent Myoview in April 2015 showed EF 61 and normal perfusion. [place] and had hip fx 3/16 requiring surgical repair. Then admitted with anemia requiring transfusion. Readmitted 5/16 and found to have atrial flutter. Echo 5/16 showed EF 50 with basal inferior akinesis, mild LAE, mild RAE/RVE. Placed on amiodarone and converted. At last office visit in July 2016 we referred to electrophysiology for consideration of atrial flutter ablation to avoid long-term amiodarone and anticoagulation. Ablation was scheduled but patient developed cholecystitis and had cholecystectomy in September. Since last seen, he denies dyspnea, chest pain, palpitations or syncope.  Current Outpatient Prescriptions  Medication Sig Dispense Refill  . ACCU-CHEK AVIVA PLUS test strip 1 strip by Does not apply route 2 (two) times daily.    [name] ACCU-CHEK SOFTCLIX LANCETS lancets 1 each by Other route 2 (two) times daily.    [name] amiodarone (PACERONE) 200 MG tablet Take 1 tablet (200 mg total) by mouth daily. 90 tablet 3  . amLODipine (NORVASC) 10 MG tablet Take 10 mg by mouth daily.    . Blood Glucose Calibration (ACCU-CHEK AVIVA) SOLN 1 drop by Does not apply route as needed.    . Blood Glucose Monitoring Suppl (ACCU-CHEK AVIVA PLUS) W/DEVICE KIT 1 cartridge by Does not apply route as needed.    . calcium citrate-vitamin D (CITRACAL+D) 315-200 MG-UNIT per tablet Take 1 tablet by mouth daily.     . carvedilol (COREG) 12.5 MG tablet Take 1 tablet (12.5 mg total) by mouth 2 (two) times daily with a meal. 180 tablet 3  . docusate sodium (COLACE) 100 MG capsule Take 100 mg by mouth 2 (two) times daily as needed (constipation).     . famotidine (PEPCID) 20 MG tablet Take 1  tablet (20 mg total) by mouth 2 (two) times daily. (Patient taking differently: Take 20 mg by mouth 2 (two) times daily as needed for heartburn. ) 60 tablet 0  . fish oil-omega-3 fatty acids 1000 MG capsule Take 1 g by mouth daily.    . fluticasone (FLONASE) 50 MCG/ACT nasal spray Place 2 sprays into both nostrils daily as needed for allergies. 16 g 11  . furosemide (LASIX) 40 MG tablet Take 40 mg by mouth daily as needed for fluid. Take 1 tablet by mouth daily as needed for a weight gain of more than 5 lbs in a week.    . hydroxypropyl methylcellulose (ISOPTO TEARS) 2.5 % ophthalmic solution Place 2 drops into both eyes 4 (four) times daily as needed for dry eyes.    . insulin aspart protamine - aspart (NOVOLOG MIX 70/30 FLEXPEN) (70-30) 100 UNIT/ML FlexPen Inject 0.1 mLs (10 Units total) into the skin 2 (two) times daily with a meal. (Patient taking differently: Inject 5-15 Units into the skin 2 (two) times daily with a meal. 120-130 = 0 units, ~250 = 5 units, 260-300 = 10 units, >300 = 15 units) 18 pen 3  . Insulin Pen Needle (NOVOFINE) 30G X 8 MM MISC Inject SQ as directed twice daily, use new pen needle for each injection 2 each 11  . Multiple Vitamins-Minerals (MULTIVITAMIN WITH MINERALS) tablet Take 1 tablet by mouth daily.    [name] oxyCODONE (OXY IR/ROXICODONE) 5 MG immediate release tablet Take 1-2 tablets (5-10  mg total) by mouth every 6 (six) hours as needed for moderate pain. 40 tablet 0  . pravastatin (PRAVACHOL) 40 MG tablet Take 1 tablet (40 mg total) by mouth every evening. 90 tablet 3  . terazosin (HYTRIN) 10 MG capsule Take 1 capsule (10 mg total) by mouth at bedtime. 90 capsule 3   No current facility-administered medications for this visit.     Past Medical History  Diagnosis Date  . Hyperlipidemia LDL goal < 100 04/02/2006  . Essential hypertension 12/09/2006  . Benign prostatic hypertrophy with nocturia 07/07/2006  . Chronic venous insufficiency 04/12/2010  . Obesity (BMI  30.0-34.9) 01/15/2012  . Constipation 05/25/2009    Intermittent   . Microcytic normochromic anemia 05/27/2006  . Internal and external hemorrhoids without complication 2/63/3354  . Coronary artery disease 04/02/2006    s/p RCA DES 2006, low risk myoview in 2011  . Type 2 diabetes mellitus with neurological manifestations ([place]) 04/02/2006    Neuropathy of the left foot   . 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 Type 2 diabetes mellitus with peripheral artery disease ([place]) 05/27/2006    Absent pulses in the left foot   . Type 2 diabetes mellitus with stage 3 chronic kidney disease ([place])   . Closed displaced fracture of left femoral neck ([place]) 06/14/2014    s/p left hip hemiarthroplasty June 14, 2014   . Atrial flutter ([place]) 07/26/2014    May 2016, CHA2DS2VASc = 4 -> Eliquis, spontaneous conversion to NSR   . Degenerative joint disease involving multiple joints 02/02/2007  . GERD (gastroesophageal reflux disease)     Past Surgical History  Procedure Laterality Date  . Pilonidal cyst excision  1990's  . Refractive surgery Bilateral   . Fracture surgery Left     "hip/stump"  . Total hip arthroplasty Left 06/14/2014    Procedure: HEMI HIP ARTHROPLASTY ANTERIOR APPROACH;  Surgeon: Leandrew Koyanagi, MD;  Location: [facility];  Service: Orthopedics;  Laterality: Left;  . Joint replacement      Left Hip  . Leg amputation above knee Right ~ 2008  . Coronary angioplasty with stent placement    . Cholecystectomy N/A 12/12/2014    Procedure: LAPAROSCOPIC CHOLECYSTECTOMY WITH INTRAOPERATIVE CHOLANGIOGRAM;  Surgeon: Greer Pickerel, MD;  Location: [facility];  Service: General;  Laterality: N/A;    Social History   Social History  . Marital Status: Married    Spouse Name: N/A  . Number of Children: N/A  . Years of Education: N/A   Occupational History  . Not on file.   Social History  Main Topics  . Smoking status: Former Smoker -- 1.00 packs/day for 10 years    Types: Cigarettes    Quit date: 06/16/1968  . Smokeless tobacco: Never Used  . Alcohol Use: 0.0 oz/week    0 Standard drinks or equivalent per week     Comment: "quit alcohol in the 1960's"  . Drug Use: No  . Sexual Activity: Yes    Birth Control/ Protection: None   Other Topics Concern  . Not on file   Social History Narrative    ROS: no fevers or chills, productive cough, hemoptysis, dysphasia, odynophagia, melena, hematochezia, dysuria, hematuria, rash, seizure activity, orthopnea, PND, pedal edema, claudication. Remaining systems are negative.  Physical Exam: Well-developed well-nourished in no acute distress.  Skin is warm and dry.  HEENT is normal.  Neck is supple.  Chest is clear to auscultation with normal expansion.  Cardiovascular exam is regular rate and rhythm.  Abdominal exam nontender or distended. No masses palpated. Extremities Status post right lower extremity amputation. Left lower extremity with trace edema. neuro grossly intact

## 2015-01-09 ENCOUNTER — Encounter: Payer: Self-pay | Admitting: Cardiology

## 2015-01-09 ENCOUNTER — Ambulatory Visit (INDEPENDENT_AMBULATORY_CARE_PROVIDER_SITE_OTHER): Payer: Medicare PPO | Admitting: Cardiology

## 2015-01-09 VITALS — BP 157/72 | HR 60 | Ht 68.0 in

## 2015-01-09 DIAGNOSIS — I2510 Atherosclerotic heart disease of native coronary artery without angina pectoris: Secondary | ICD-10-CM

## 2015-01-09 DIAGNOSIS — E785 Hyperlipidemia, unspecified: Secondary | ICD-10-CM

## 2015-01-09 DIAGNOSIS — I10 Essential (primary) hypertension: Secondary | ICD-10-CM | POA: Diagnosis not present

## 2015-01-09 DIAGNOSIS — I2583 Coronary atherosclerosis due to lipid rich plaque: Secondary | ICD-10-CM

## 2015-01-09 DIAGNOSIS — I4892 Unspecified atrial flutter: Secondary | ICD-10-CM | POA: Diagnosis not present

## 2015-01-09 NOTE — Patient Instructions (Signed)
Your physician wants you to follow-up in: 6 MONTHSA WITH DR Jens SomRENSHAW You will receive a reminder letter in the mail two months in advance. If you don't receive a letter, please call our office to schedule the follow-up appointment.   DR Sharlot GowdaGREGG TAYLOR'S NURSE KELLY TO CALL ABOUT [phone]

## 2015-01-09 NOTE — Assessment & Plan Note (Signed)
Continue statin. 

## 2015-01-09 NOTE — Assessment & Plan Note (Signed)
Continue statin. Not on aspirin given need for anticoagulation. 

## 2015-01-09 NOTE — Assessment & Plan Note (Signed)
Blood pressure mildly elevated. I have asked him to follow this at home and if it remains increased we will increase medications.

## 2015-01-09 NOTE — Assessment & Plan Note (Addendum)
Plan to continue amiodarone and apixaban. Patient had previously been scheduled for atrial flutter ablation. This was canceled because of his cholecystitis and need for cholecystectomy. He is now back on anticoagulation. I would like to avoid this long-term as well as avoiding amiodarone long-term. We will arrange for him to see Dr. Ladona Ridgelaylor to have his atrial flutter ablated. Note atrial flutter is clearly documented under cardiovascular strips on May 3.

## 2015-01-12 ENCOUNTER — Other Ambulatory Visit: Payer: Self-pay | Admitting: Internal Medicine

## 2015-01-12 DIAGNOSIS — J302 Other seasonal allergic rhinitis: Secondary | ICD-10-CM

## 2015-01-15 ENCOUNTER — Telehealth: Payer: Self-pay | Admitting: *Deleted

## 2015-01-15 DIAGNOSIS — I4892 Unspecified atrial flutter: Secondary | ICD-10-CM

## 2015-01-15 DIAGNOSIS — Z01812 Encounter for preprocedural laboratory examination: Secondary | ICD-10-CM

## 2015-01-15 NOTE — Telephone Encounter (Signed)
Follow up ° ° ° ° ° °Returning a call to Kelly °

## 2015-01-15 NOTE — Telephone Encounter (Signed)
I have left the patient a message to call me back in regards to scheduling his ablation.  I have it scheduled for 02/07/15

## 2015-01-15 NOTE — Telephone Encounter (Signed)
Spoke with patient's wife and gave her the date  Will have labs done on 01/31/15 at 10:00am and pick up instruction sheet

## 2015-01-16 ENCOUNTER — Encounter: Payer: Self-pay | Admitting: *Deleted

## 2015-01-19 ENCOUNTER — Ambulatory Visit: Payer: Medicare PPO | Admitting: Internal Medicine

## 2015-01-19 ENCOUNTER — Encounter: Payer: Self-pay | Admitting: Internal Medicine

## 2015-01-19 ENCOUNTER — Ambulatory Visit (INDEPENDENT_AMBULATORY_CARE_PROVIDER_SITE_OTHER): Payer: Medicare PPO | Admitting: Internal Medicine

## 2015-01-19 VITALS — BP 121/65 | HR 61 | Temp 97.9°F | Wt 196.2 lb

## 2015-01-19 DIAGNOSIS — Z794 Long term (current) use of insulin: Principal | ICD-10-CM

## 2015-01-19 DIAGNOSIS — I10 Essential (primary) hypertension: Secondary | ICD-10-CM

## 2015-01-19 DIAGNOSIS — F302 Manic episode, severe with psychotic symptoms: Secondary | ICD-10-CM | POA: Diagnosis not present

## 2015-01-19 DIAGNOSIS — N184 Chronic kidney disease, stage 4 (severe): Secondary | ICD-10-CM | POA: Diagnosis not present

## 2015-01-19 DIAGNOSIS — Z23 Encounter for immunization: Secondary | ICD-10-CM | POA: Diagnosis not present

## 2015-01-19 DIAGNOSIS — E1122 Type 2 diabetes mellitus with diabetic chronic kidney disease: Secondary | ICD-10-CM | POA: Diagnosis not present

## 2015-01-19 DIAGNOSIS — I483 Typical atrial flutter: Secondary | ICD-10-CM

## 2015-01-19 DIAGNOSIS — R351 Nocturia: Secondary | ICD-10-CM

## 2015-01-19 DIAGNOSIS — I700 Atherosclerosis of aorta: Secondary | ICD-10-CM

## 2015-01-19 DIAGNOSIS — Z7901 Long term (current) use of anticoagulants: Secondary | ICD-10-CM

## 2015-01-19 DIAGNOSIS — N401 Enlarged prostate with lower urinary tract symptoms: Secondary | ICD-10-CM

## 2015-01-19 DIAGNOSIS — B182 Chronic viral hepatitis C: Secondary | ICD-10-CM

## 2015-01-19 DIAGNOSIS — I129 Hypertensive chronic kidney disease with stage 1 through stage 4 chronic kidney disease, or unspecified chronic kidney disease: Secondary | ICD-10-CM

## 2015-01-19 DIAGNOSIS — I4892 Unspecified atrial flutter: Secondary | ICD-10-CM

## 2015-01-19 DIAGNOSIS — J302 Other seasonal allergic rhinitis: Secondary | ICD-10-CM

## 2015-01-19 LAB — GLUCOSE, CAPILLARY: GLUCOSE-CAPILLARY: 139 mg/dL — AB (ref 65–99)

## 2015-01-19 LAB — POCT GLYCOSYLATED HEMOGLOBIN (HGB A1C): Hemoglobin A1C: 5.8

## 2015-01-19 MED ORDER — AMIODARONE HCL 200 MG PO TABS: 200.0000 mg | ORAL_TABLET | Freq: Every day | ORAL | Status: DC

## 2015-01-19 MED ORDER — CARVEDILOL 12.5 MG PO TABS: 12.5000 mg | ORAL_TABLET | Freq: Two times a day (BID) | ORAL | Status: DC

## 2015-01-19 MED ORDER — FLUTICASONE PROPIONATE 50 MCG/ACT NA SUSP: 1.0000 | Freq: Every day | NASAL | Status: DC | PRN

## 2015-01-19 NOTE — Assessment & Plan Note (Signed)
His diabetes has been under exceptional control on essentially diet alone. He states he very infrequently requires any insulin whatsoever. His hemoglobin A1c today is 5.8. Although this is wonderful news for his diabetes I suspect it may also represent a worsening in his renal failure. At this point he is not volume overloaded and the most recent creatinine one month ago confirmed stage IV chronic kidney disease associated with his diabetes. He has never tolerated ace inhibition despite several therapeutic trials. At this point we will continue to aggressively treat his blood pressure which is at target at 121/65. I will discuss the topic of referral to nephrology for possible preparation for eventual hemodialysis at the follow-up visit. I will likely check a basic metabolic panel at that time as well. In the meantime, I asked him to continue to hold his insulin unless his sugars were above 250. We will reassess his diabetic control by review of his logs as well as a repeat hemoglobin A1c at the follow-up visit.

## 2015-01-19 NOTE — Assessment & Plan Note (Signed)
His seasonal allergic rhinitis is fairly well controlled on the fluticasone 1 spray daily as needed. He has asked for a refill on this was provided. We will reassess his symptoms at the follow-up visit.

## 2015-01-19 NOTE — Assessment & Plan Note (Signed)
He continues to be asymptomatic from an aortic atherosclerotic standpoint. We are continuing with aggressive risk factor modification including tight control of both his blood pressure and diabetes as accomplished today.

## 2015-01-19 NOTE — Patient Instructions (Signed)
It was good to see you again.  You are doing a nice job with your health.  1) Keep taking the medications as you are.  2) We gave you a hepatitis A shot today.  We will repeat it in 6 months.  I will see you back in 3 months, sooner if necessary.

## 2015-01-19 NOTE — Assessment & Plan Note (Signed)
He is scheduled to undergo ablation for his atrial flutter on November 16. We will reassess the success of this intervention at the follow-up visit. In the meantime he is to continue the Eliquis, amiodarone, and carvedilol.

## 2015-01-19 NOTE — Assessment & Plan Note (Signed)
His blood pressure today was 121/65. This is at target. This is while taking amlodipine 10 mg by mouth daily, carvedilol 12.5 mg by mouth twice daily, and terazosin 10 mg by mouth at bedtime. We will continue with this 3 drug regimen at the current doses and reassess the blood pressure at the follow-up visit.

## 2015-01-19 NOTE — Assessment & Plan Note (Signed)
We had a long conversation today about the risks of chronic hepatitis C. Fortunately, it appears he only has mild nodular fibrosis on gross examination during his cholecystectomy. His right upper quadrant ultrasound was relatively unremarkable. He is not interested in pursuing therapy for his hepatitis C at this time. We will readdress his hepatitis see and any interest he may have with therapy at the follow-up visit.

## 2015-01-19 NOTE — Assessment & Plan Note (Signed)
He was recently seen by his new urologist. He agreed that the terazosin at 10 mg nightly was necessary for his BPH. A PSA was reportedly obtained as was a bladder scan. I do not have the results available for my review. Unfortunately, [name] did not mention the gross hematuria that he had at one time as I asked him to do. He has not had any further gross hematuria. I asked that he let myself know immediately were he to ever have another episode of gross hematuria. We will reassess his bladder outlet obstruction symptoms and nocturia at the follow-up visit.

## 2015-01-19 NOTE — Progress Notes (Signed)
   Subjective:    Patient ID: [name] [name], male    DOB: 04/07/1945, 69 y.o.   MRN: [id number]  HPI  [name] [name] is here for follow-up of his diabetes, atrial flutter, BPH, and Hepatitis C. Please see the A&P for the status of the pt's chronic medical problems.  Review of Systems  Constitutional: Negative for activity change, appetite change and unexpected weight change.  HENT: Negative for postnasal drip, rhinorrhea, sinus pressure and sneezing.   Respiratory: Positive for cough. Negative for chest tightness, shortness of breath and wheezing.   Cardiovascular: Negative for chest pain and palpitations.  Gastrointestinal: Positive for constipation. Negative for nausea, vomiting, abdominal pain, diarrhea and abdominal distention.  Genitourinary: Positive for frequency. Negative for hematuria.  Allergic/Immunologic: Positive for environmental allergies.  Psychiatric/Behavioral: Negative for dysphoric mood. The patient is not nervous/anxious.       Objective:   Physical Exam  Constitutional: He is oriented to person, place, and time. He appears well-developed and well-nourished. No distress.  HENT:  Head: Normocephalic and atraumatic.  Eyes: Conjunctivae are normal. Right eye exhibits no discharge. Left eye exhibits no discharge. No scleral icterus.  Neurological: He is alert and oriented to person, place, and time.  Skin: He is not diaphoretic.  Psychiatric: He has a normal mood and affect. His behavior is normal. Judgment and thought content normal.  Nursing note and vitals reviewed.     Assessment & Plan:   Please see problem oriented charting.

## 2015-01-31 ENCOUNTER — Other Ambulatory Visit: Payer: Medicare PPO

## 2015-02-02 ENCOUNTER — Other Ambulatory Visit (INDEPENDENT_AMBULATORY_CARE_PROVIDER_SITE_OTHER): Payer: Medicare PPO | Admitting: *Deleted

## 2015-02-02 DIAGNOSIS — Z01812 Encounter for preprocedural laboratory examination: Secondary | ICD-10-CM

## 2015-02-02 DIAGNOSIS — I4892 Unspecified atrial flutter: Secondary | ICD-10-CM

## 2015-02-02 LAB — BASIC METABOLIC PANEL
BUN: 62 mg/dL — ABNORMAL HIGH (ref 7–25)
CALCIUM: 8 mg/dL — AB (ref 8.6–10.3)
CO2: 18 mmol/L — AB (ref 20–31)
CREATININE: 3.32 mg/dL — AB (ref 0.70–1.18)
Chloride: 111 mmol/L — ABNORMAL HIGH (ref 98–110)
GLUCOSE: 156 mg/dL — AB (ref 65–99)
Potassium: 4.7 mmol/L (ref 3.5–5.3)
Sodium: 139 mmol/L (ref 135–146)

## 2015-02-03 LAB — CBC WITH DIFFERENTIAL/PLATELET
BASOS PCT: 0 % (ref 0–1)
Basophils Absolute: 0 10*3/uL (ref 0.0–0.1)
EOS ABS: 0.1 10*3/uL (ref 0.0–0.7)
EOS PCT: 2 % (ref 0–5)
HCT: 26.4 % — ABNORMAL LOW (ref 39.0–52.0)
Hemoglobin: 8.3 g/dL — ABNORMAL LOW (ref 13.0–17.0)
Lymphocytes Relative: 32 % (ref 12–46)
Lymphs Abs: 1.6 10*3/uL (ref 0.7–4.0)
MCH: 23.3 pg — AB (ref 26.0–34.0)
MCHC: 31.4 g/dL (ref 30.0–36.0)
MCV: 74.2 fL — ABNORMAL LOW (ref 78.0–100.0)
MONO ABS: 0.5 10*3/uL (ref 0.1–1.0)
MONOS PCT: 9 % (ref 3–12)
Neutro Abs: 2.9 10*3/uL (ref 1.7–7.7)
Neutrophils Relative %: 57 % (ref 43–77)
PLATELETS: 115 10*3/uL — AB (ref 150–400)
RBC: 3.56 MIL/uL — ABNORMAL LOW (ref 4.22–5.81)
RDW: 16.7 % — AB (ref 11.5–15.5)
WBC: 5.1 10*3/uL (ref 4.0–10.5)

## 2015-02-07 ENCOUNTER — Encounter (HOSPITAL_COMMUNITY): Payer: Self-pay | Admitting: Internal Medicine

## 2015-02-07 ENCOUNTER — Ambulatory Visit (HOSPITAL_COMMUNITY)
Admission: RE | Admit: 2015-02-07 | Discharge: 2015-02-07 | Disposition: A | Discharge status: Home / Self Care | Payer: Medicare PPO | Source: Ambulatory Visit | Attending: Internal Medicine | Admitting: Internal Medicine

## 2015-02-07 ENCOUNTER — Encounter (HOSPITAL_COMMUNITY)
Admission: RE | Disposition: A | Discharge status: Home / Self Care | Payer: Self-pay | Source: Ambulatory Visit | Attending: Internal Medicine

## 2015-02-07 DIAGNOSIS — I129 Hypertensive chronic kidney disease with stage 1 through stage 4 chronic kidney disease, or unspecified chronic kidney disease: Secondary | ICD-10-CM | POA: Insufficient documentation

## 2015-02-07 DIAGNOSIS — Z881 Allergy status to other antibiotic agents status: Secondary | ICD-10-CM | POA: Insufficient documentation

## 2015-02-07 DIAGNOSIS — N185 Chronic kidney disease, stage 5: Secondary | ICD-10-CM

## 2015-02-07 DIAGNOSIS — I4892 Unspecified atrial flutter: Secondary | ICD-10-CM | POA: Diagnosis not present

## 2015-02-07 DIAGNOSIS — E785 Hyperlipidemia, unspecified: Secondary | ICD-10-CM | POA: Diagnosis not present

## 2015-02-07 DIAGNOSIS — D631 Anemia in chronic kidney disease: Secondary | ICD-10-CM | POA: Diagnosis present

## 2015-02-07 DIAGNOSIS — E1122 Type 2 diabetes mellitus with diabetic chronic kidney disease: Secondary | ICD-10-CM | POA: Diagnosis not present

## 2015-02-07 DIAGNOSIS — I4891 Unspecified atrial fibrillation: Secondary | ICD-10-CM | POA: Insufficient documentation

## 2015-02-07 DIAGNOSIS — I10 Essential (primary) hypertension: Secondary | ICD-10-CM | POA: Diagnosis present

## 2015-02-07 DIAGNOSIS — Z794 Long term (current) use of insulin: Secondary | ICD-10-CM | POA: Diagnosis not present

## 2015-02-07 DIAGNOSIS — Z880 Allergy status to penicillin: Secondary | ICD-10-CM | POA: Insufficient documentation

## 2015-02-07 DIAGNOSIS — Z79899 Other long term (current) drug therapy: Secondary | ICD-10-CM | POA: Insufficient documentation

## 2015-02-07 DIAGNOSIS — N183 Chronic kidney disease, stage 3 (moderate): Secondary | ICD-10-CM | POA: Insufficient documentation

## 2015-02-07 DIAGNOSIS — I2510 Atherosclerotic heart disease of native coronary artery without angina pectoris: Secondary | ICD-10-CM | POA: Diagnosis not present

## 2015-02-07 DIAGNOSIS — E1151 Type 2 diabetes mellitus with diabetic peripheral angiopathy without gangrene: Secondary | ICD-10-CM | POA: Diagnosis not present

## 2015-02-07 DIAGNOSIS — I25118 Atherosclerotic heart disease of native coronary artery with other forms of angina pectoris: Secondary | ICD-10-CM | POA: Diagnosis present

## 2015-02-07 DIAGNOSIS — Z87891 Personal history of nicotine dependence: Secondary | ICD-10-CM | POA: Insufficient documentation

## 2015-02-07 DIAGNOSIS — I4510 Unspecified right bundle-branch block: Secondary | ICD-10-CM | POA: Diagnosis present

## 2015-02-07 DIAGNOSIS — Z955 Presence of coronary angioplasty implant and graft: Secondary | ICD-10-CM | POA: Insufficient documentation

## 2015-02-07 DIAGNOSIS — E663 Overweight: Secondary | ICD-10-CM | POA: Diagnosis present

## 2015-02-07 DIAGNOSIS — N186 End stage renal disease: Secondary | ICD-10-CM

## 2015-02-07 HISTORY — PX: ELECTROPHYSIOLOGIC STUDY: SHX172A

## 2015-02-07 LAB — GLUCOSE, CAPILLARY: Glucose-Capillary: 122 mg/dL — ABNORMAL HIGH (ref 65–99)

## 2015-02-07 SURGERY — A-FLUTTER/A-TACH/SVT ABLATION
Anesthesia: LOCAL

## 2015-02-07 MED ORDER — ONDANSETRON HCL 4 MG/2ML IJ SOLN: 4.0000 mg | Freq: Four times a day (QID) | INTRAMUSCULAR | Status: DC | PRN

## 2015-02-07 MED ORDER — SODIUM CHLORIDE 0.9 % IV SOLN
INTRAVENOUS | Status: DC
  Administered 2015-02-07: 09:00:00 via INTRAVENOUS

## 2015-02-07 MED ORDER — HEPARIN (PORCINE) IN NACL 2-0.9 UNIT/ML-% IJ SOLN
INTRAMUSCULAR | Status: DC | PRN
  Administered 2015-02-07: 12:00:00

## 2015-02-07 MED ORDER — BUPIVACAINE HCL (PF) 0.25 % IJ SOLN
INTRAMUSCULAR | Status: AC
  Filled 2015-02-07: qty 60

## 2015-02-07 MED ORDER — FENTANYL CITRATE (PF) 100 MCG/2ML IJ SOLN
INTRAMUSCULAR | Status: DC | PRN
  Administered 2015-02-07 (×3): 12.5 ug via INTRAVENOUS

## 2015-02-07 MED ORDER — FENTANYL CITRATE (PF) 100 MCG/2ML IJ SOLN
INTRAMUSCULAR | Status: AC
  Filled 2015-02-07: qty 2

## 2015-02-07 MED ORDER — BUPIVACAINE HCL (PF) 0.25 % IJ SOLN
INTRAMUSCULAR | Status: DC | PRN
  Administered 2015-02-07: 31 mL

## 2015-02-07 MED ORDER — SODIUM CHLORIDE 0.9 % IJ SOLN: 3.0000 mL | INTRAMUSCULAR | Status: DC | PRN

## 2015-02-07 MED ORDER — ACETAMINOPHEN 325 MG PO TABS: 650.0000 mg | ORAL_TABLET | ORAL | Status: DC | PRN

## 2015-02-07 MED ORDER — SODIUM CHLORIDE 0.9 % IV SOLN: 250.0000 mL | INTRAVENOUS | Status: DC | PRN

## 2015-02-07 MED ORDER — SODIUM CHLORIDE 0.9 % IJ SOLN
3.0000 mL | Freq: Two times a day (BID) | INTRAMUSCULAR | Status: DC
  Administered 2015-02-07: 3 mL via INTRAVENOUS

## 2015-02-07 MED ORDER — MIDAZOLAM HCL 5 MG/5ML IJ SOLN
INTRAMUSCULAR | Status: DC | PRN
  Administered 2015-02-07 (×3): 1 mg via INTRAVENOUS

## 2015-02-07 MED ORDER — MIDAZOLAM HCL 5 MG/5ML IJ SOLN
INTRAMUSCULAR | Status: AC
  Filled 2015-02-07: qty 5

## 2015-02-07 SURGICAL SUPPLY — 10 items
BAG SNAP BAND KOVER 36X36 (MISCELLANEOUS) ×1 IMPLANT
CATH JOSEPHSON QUAD-ALLRED 6FR (CATHETERS) ×1 IMPLANT
CATH POLARIS X 2.5/5/2.5 DECAP (CATHETERS) ×1 IMPLANT
PACK EP LATEX FREE (CUSTOM PROCEDURE TRAY) ×2
PACK EP LF (CUSTOM PROCEDURE TRAY) IMPLANT
PAD DEFIB LIFELINK (PAD) ×1 IMPLANT
SHEATH PINNACLE 6F 10CM (SHEATH) ×2 IMPLANT
SHEATH PINNACLE 7F 10CM (SHEATH) ×1 IMPLANT
SHEATH PINNACLE 8F 10CM (SHEATH) ×1 IMPLANT
SHIELD RADPAD SCOOP 12X17 (MISCELLANEOUS) ×1 IMPLANT

## 2015-02-07 NOTE — Discharge Instructions (Signed)

## 2015-02-07 NOTE — H&P (Signed)
HPI [name] [name] is referred today by Dr. Jens Som for evaluation of atrial flutter. He is a pleasant 69 yo man with peripheral vascular disease, s/p AKA, HTN, DM, and atrial flutter. The patient was placed on systemic anti-coagulation and amiodarone and reverted back to NSR. He has not had syncope. He feels poorly in atrial flutter. He has tolerated both his anticoagulation and amiodarone. It was felt by Dr. Marsa Aris that her would be better off not taking amio or anti-coagulation if possible and he is referred for cathter ablation of atrial flutter. He denies frank syncope.  Allergies  Allergen Reactions  . Amoxicillin Swelling and Other (See Comments)    Puffy eyes and abdominal pain with Amoxicillin PO  . Pravastatin Other (See Comments)    Weakness and fatigue  . Tamsulosin Other (See Comments)    REACTION: dry throat, sweating, blurred vision  . Doxycycline Rash and Other (See Comments)    Questionable drug rxn rash     Current Outpatient Prescriptions  Medication Sig Dispense Refill  . amiodarone (PACERONE) 200 MG tablet Take 1 tablet (200 mg total) by mouth daily. 90 tablet 3  . amLODipine (NORVASC) 10 MG tablet Take 10 mg by mouth daily.    [name] apixaban (ELIQUIS) 5 MG TABS tablet Take 1 tablet (5 mg total) by mouth 2 (two) times daily. 180 tablet 3  . calcium citrate-vitamin D (CITRACAL+D) 315-200 MG-UNIT per tablet Take 1 tablet by mouth daily.     . carvedilol (COREG) 12.5 MG tablet Take 1 tablet (12.5 mg total) by mouth 2 (two) times daily with a meal. 180 tablet 3  . docusate sodium (COLACE) 100 MG capsule Take 100 mg by mouth 2 (two) times daily as needed (constipation).     . famotidine (PEPCID) 20 MG tablet Take 1 tablet (20 mg total) by mouth 2 (two) times daily. 60 tablet 0  . fish oil-omega-3 fatty acids 1000 MG capsule Take 1 g by mouth daily.    . fluticasone (FLONASE) 50 MCG/ACT nasal spray Place 2  sprays into both nostrils daily as needed for allergies. 16 g 11  . furosemide (LASIX) 40 MG tablet Take 1 tablet by mouth daily as needed for a weight gain of more than 5 lbs in a week.    . hydroxypropyl methylcellulose (ISOPTO TEARS) 2.5 % ophthalmic solution Place 2 drops into both eyes 4 (four) times daily as needed for dry eyes.    . insulin aspart protamine - aspart (NOVOLOG MIX 70/30 FLEXPEN) (70-30) 100 UNIT/ML FlexPen Inject 0.3 mLs (30 Units total) into the skin as directed. Inject 30 units in the morning and 15 units in the evening 18 pen 3  . Insulin Pen Needle (NOVOFINE) 30G X 8 MM MISC Inject SQ as directed twice daily, use new pen needle for each injection 2 each 11  . lisinopril (PRINIVIL,ZESTRIL) 5 MG tablet Take 0.5 tablets (2.5 mg total) by mouth daily. 45 tablet 3  . Multiple Vitamins-Minerals (MULTIVITAMIN WITH MINERALS) tablet Take 1 tablet by mouth daily.    . pravastatin (PRAVACHOL) 40 MG tablet Take 1 tablet (40 mg total) by mouth every evening. 90 tablet 3  . sennosides-docusate sodium (SENOKOT-S) 8.6-50 MG tablet Take 2 tablets by mouth daily as needed for constipation.     [name] terazosin (HYTRIN) 10 MG capsule Take 1 capsule (10 mg total) by mouth at bedtime. 90 capsule 3   No current facility-administered medications for this visit.     Past Medical History  Diagnosis  Date  . Hyperlipidemia LDL goal < 100 04/02/2006  . Essential hypertension 12/09/2006  . Benign prostatic hypertrophy with nocturia 07/07/2006  . Chronic venous insufficiency 04/12/2010  . Obesity (BMI 30.0-34.9) 01/15/2012  . Constipation 05/25/2009    Intermittent   . Microcytic normochromic anemia 05/27/2006  . Seasonal allergic rhinitis 05/25/2009  . Internal and external hemorrhoids without complication 08/20/2012  . Coronary artery disease 04/02/2006    s/p RCA DES 2006, low risk myoview in 2011  . Type 2  diabetes mellitus with neurological manifestations 04/02/2006    Neuropathy of the left foot   . 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 Type 2 diabetes mellitus with stage 3 chronic kidney disease   . Closed displaced fracture of left femoral neck 06/14/2014    s/p left hip hemiarthroplasty June 14, 2014   . Atrial flutter 07/26/2014    May 2016, CHA2DS2VASc = 4 -> Eliquis, spontaneous conversion to NSR   . Degenerative joint disease involving multiple joints 02/02/2007  . Gastroesophageal reflux disease     ROS:  All systems reviewed and negative except as noted in the HPI.   Past Surgical History  Procedure Laterality Date  . Pilonidal cyst excision  1990's  . Refractive surgery Bilateral   . Fracture surgery Left     "hip/stump"  . Total hip arthroplasty Left 06/14/2014    Procedure: HEMI HIP ARTHROPLASTY ANTERIOR APPROACH; Surgeon: Tarry KosNaiping M Xu, MD; Location: [facility]; Service: Orthopedics; Laterality: Left;  . Joint replacement      Left Hip  . Leg amputation above knee Right ~ 2008  . Coronary angioplasty with stent placement       Family History  Problem Relation Age of Onset  . Hypertension Mother   . Heart attack Father   . Breast cancer Sister   . Arthritis Sister   . Heart attack Brother   . Diabetes Brother   . Stroke Brother   . Pneumonia Daughter   . Diabetes Brother   . Alcoholism Brother   . Diabetes Brother   . Arthritis Sister     Bilateral knee replacement  . HIV Daughter   . Hypertension  Daughter   . Drug abuse Daughter   . Schizophrenia Daughter   . Hypertension Daughter   . Drug abuse Daughter   . Bipolar disorder Daughter      History   Social History  . Marital Status: Married    Spouse Name: N/A  . Number of Children: N/A  . Years of Education: N/A   Occupational History  . Not on file.   Social History Main Topics  . Smoking status: Former Smoker -- 1.00 packs/day for 10 years    Types: Cigarettes    Quit date: 06/16/1968  . Smokeless tobacco: Never Used  . Alcohol Use: Yes     Comment: "quit alcohol in the 1960's"  . Drug Use: No  . Sexual Activity: Yes    Birth Control/ Protection: None   Other Topics Concern  . Not on file   Social History Narrative     BP 124/78 mmHg  Pulse 60  Ht 5\' 8"  (1.727 m)  Wt 193 lb (87.544 kg)  BMI 29.35 kg/m2  Physical Exam:  Well appearing 69 yo man, NAD HEENT: Unremarkable Neck: 7 cm JVD, no thyromegally Back: No CVA tenderness Lungs: Clear with no wheezes HEART: Regular rate rhythm, no murmurs, no rubs, no clicks Abd: soft, positive bowel sounds, no organomegally, no rebound, no guarding Ext: 2 plus pulses, no edema, no cyanosis, no clubbing, right AKA. Skin: No rashes no nodules Neuro: CN II through XII intact, motor grossly intact  EKG - nsr with RBBB, left axis, LVH  DEVICE  Normal device function. See PaceArt for details.   Assess/Plan:            Atrial flutter - Marinus Maw, MD at 10/26/2014 2:17 PM     Status: Written Related Problem: Atrial flutter   Expand All Collapse All   I have discussed the treatment options with the patient and his wife. The risk/benefit/goals/expectations of catheter ablation of atrial flutter have been reviewed with the patient and his wife and he wishes to proceed.            Essential hypertension - Marinus Maw, MD at 10/26/2014 2:18 PM      Status: Written Related Problem: Essential hypertension   Expand All Collapse All   His blood pressure is well controlled. No change in meds. He is encouraged to reduce his sodium intake       EP Attending  Patient seen and examined. Since his last visit, he had gall bladder problems and a cholecystectomy. He has been stable and returns today for flutter ablation. He has been taking his anti-coagulation. I have reviewed the indications, risks/benefits/goals/expectations of the procedure with the patient and he wishes to proceed.  Leonia Reeves.D.

## 2015-02-07 NOTE — Progress Notes (Signed)
Site area: RT GROIN Site Prior to Removal:  Level O Pressure Applied For:18 MINUTES  Manual:   YES Patient Status During Pull:  AWAKE Post Pull Site:  Level O Post Pull Instructions Given:  YES Post Pull Pulses Present:  Dressing Applied:  YES Bedrest begins @ 13:30:00 Comments:

## 2015-02-07 NOTE — Discharge Summary (Signed)
Discharge Summary   Patient ID: [name] [name],  MRN: [id number], DOB/AGE: 71-Oct-1947 71 y.o.  Admit date: 02/07/2015 Discharge date: 02/07/2015  Primary Care Provider: Oval Linsey D Primary Cardiologist: B. Stanford Breed, MD / G. Lovena Le, MD (EP)  Discharge Diagnoses Principal Problem:   Paroxysmal atrial flutter (HCC)  **S/P EP study.  No inducible/ablatable Aflutter.  Active Problems:   Essential hypertension   Coronary artery disease   Overweight (BMI 25.0-29.9)   Type 2 diabetes mellitus with stage 4 chronic kidney disease (HCC)   Anemia of chronic renal failure, stage 4 (severe) (HCC)   Right bundle branch block (RBBB)   Hyperlipidemia   Allergies Allergies  Allergen Reactions  . Amoxicillin Swelling and Other (See Comments)    Puffy eyes and abdominal pain with Amoxicillin PO  . Lisinopril Other (See Comments)    Acute kidney injury  . Pravastatin Other (See Comments)    Weakness and fatigue  . Tamsulosin Other (See Comments)    REACTION: dry throat, sweating, blurred vision  . Doxycycline Rash and Other (See Comments)    Questionable drug rxn rash    Procedures  Electrophysiologic Study 11.16.2016  Conclusion: Electrophysiologic study demonstrating no inducible sustained atrial flutter. There was also nonsustained atrial fibrillation. There were no sustained atrial flutter originating from the right atrium. No radiofrequency energy was delivered. Arrhythmias observed:  A. Atrial fibrillation, initiation was with rapid atrial pacing, the duration was nonsustained.  B. Left atrial flutter. Initiation was with rapid atrial pacing, the duration was nonsustained.  History of Present Illness  69 y/o male with a h/o PAD s/p AKA, HTN, DM, and paroxysmal atrial flutter.  He has been chronically anticoagulated and also managed with amiodarone therapy.  He was recently evaluated by Dr. Lovena Le for consideration for catheter ablation.  Hospital Course  Patient  presented to the [facility] EP lab on 11/16 and underwent EP study.  Though he was noted to have both non-sustained AFib and left sided atrial flutter, he did not have any inducible/ablatable right sided atrial flutter.  As a result, ablation was not performed.  Post-procedure, he has remained stable and has been ambulating without difficulty.  He will be discharged home this evening in good condition.  Discharge Vitals Blood pressure 157/71, pulse 53, temperature 97.7 F (36.5 C), temperature source Oral, resp. rate 16, height $RemoveBe'5\' 9"'kDoyrMLlw$  (1.753 m), weight 181 lb 11.2 oz (82.419 kg), SpO2 99 %.  Filed Weights   02/07/15 0800 02/07/15 1402  Weight: 192 lb (87.091 kg) 181 lb 11.2 oz (82.419 kg)    Labs  None  Disposition  Pt is being discharged home today in good condition.  Follow-up Plans & Appointments      Follow-up Information    Follow up with Kirk Ruths, MD.   Specialty:  Cardiology   Why:  We will arrange for follow-up in 3-4 wks and contact you.   Contact information:   [address] [place] [address] [phone]       Discharge Medications    Medication List    TAKE these medications        ACCU-CHEK AVIVA PLUS test strip  Generic drug:  glucose blood  1 strip by Does not apply route 2 (two) times daily.     ACCU-CHEK AVIVA PLUS W/DEVICE Kit  1 cartridge by Does not apply route as needed.     ACCU-CHEK AVIVA Soln  1 drop by Does not apply route as needed.  ACCU-CHEK SOFTCLIX LANCETS lancets  1 each by Other route 2 (two) times daily.     amiodarone 200 MG tablet  Commonly known as:  PACERONE  Take 1 tablet (200 mg total) by mouth daily.     amLODipine 10 MG tablet  Commonly known as:  NORVASC  Take 10 mg by mouth daily.     calcium citrate-vitamin D 315-200 MG-UNIT tablet  Commonly known as:  CITRACAL+D  Take 1 tablet by mouth daily.     carvedilol 12.5 MG tablet  Commonly known as:  COREG  Take 1 tablet (12.5 mg total) by  mouth 2 (two) times daily with a meal.     docusate sodium 100 MG capsule  Commonly known as:  COLACE  Take 100 mg by mouth 2 (two) times daily as needed (constipation).     ELIQUIS 5 MG Tabs tablet  Generic drug:  apixaban  Take 5 mg by mouth 2 (two) times daily.     famotidine 20 MG tablet  Commonly known as:  PEPCID  Take 1 tablet (20 mg total) by mouth 2 (two) times daily.     fish oil-omega-3 fatty acids 1000 MG capsule  Take 1 g by mouth daily.     fluticasone 50 MCG/ACT nasal spray  Commonly known as:  FLONASE  Place 1 spray into both nostrils daily as needed for allergies.     furosemide 40 MG tablet  Commonly known as:  LASIX  Take 40 mg by mouth daily as needed for fluid. Take 1 tablet by mouth daily as needed for a weight gain of more than 5 lbs in a week.     hydroxypropyl methylcellulose / hypromellose 2.5 % ophthalmic solution  Commonly known as:  ISOPTO TEARS / GONIOVISC  Place 2 drops into both eyes 4 (four) times daily as needed for dry eyes.     multivitamin with minerals tablet  Take 1 tablet by mouth daily.     pravastatin 40 MG tablet  Commonly known as:  PRAVACHOL  Take 1 tablet (40 mg total) by mouth every evening.     terazosin 10 MG capsule  Commonly known as:  HYTRIN  Take 1 capsule (10 mg total) by mouth at bedtime.        Outstanding Labs/Studies  None  Duration of Discharge Encounter   Greater than 30 minutes including physician time.  Signed, Murray Hodgkins NP 02/07/2015, 6:52 PM    Mikle Bosworth.D.

## 2015-02-07 NOTE — Progress Notes (Signed)
Telemetry notified of patient being removed from heart monitor due to patient being discharged.  RN reviewed all discharge instructions with patient and patient's wife.  Patient and patient's wife did not have any questions at time of discharge.  Patient ambulated with RN without complications.  Right groin cath site and heart rhythm stable at time of discharge.  RN wheeled patient to main entrance via wheelchair and assisted patient into passengers seat of private car.

## 2015-02-12 ENCOUNTER — Emergency Department (HOSPITAL_COMMUNITY)
Admission: EM | Admit: 2015-02-12 | Discharge: 2015-02-12 | Discharge status: Against Medical Advice | Payer: Medicare PPO | Attending: Emergency Medicine | Admitting: Emergency Medicine

## 2015-02-12 ENCOUNTER — Encounter (HOSPITAL_COMMUNITY): Payer: Self-pay | Admitting: *Deleted

## 2015-02-12 ENCOUNTER — Telehealth: Payer: Self-pay | Admitting: *Deleted

## 2015-02-12 ENCOUNTER — Emergency Department (HOSPITAL_COMMUNITY): Payer: Medicare PPO

## 2015-02-12 DIAGNOSIS — E669 Obesity, unspecified: Secondary | ICD-10-CM | POA: Diagnosis not present

## 2015-02-12 DIAGNOSIS — I2510 Atherosclerotic heart disease of native coronary artery without angina pectoris: Secondary | ICD-10-CM | POA: Diagnosis not present

## 2015-02-12 DIAGNOSIS — Z87891 Personal history of nicotine dependence: Secondary | ICD-10-CM | POA: Insufficient documentation

## 2015-02-12 DIAGNOSIS — I10 Essential (primary) hypertension: Secondary | ICD-10-CM | POA: Diagnosis not present

## 2015-02-12 DIAGNOSIS — E119 Type 2 diabetes mellitus without complications: Secondary | ICD-10-CM | POA: Insufficient documentation

## 2015-02-12 DIAGNOSIS — M1990 Unspecified osteoarthritis, unspecified site: Secondary | ICD-10-CM | POA: Diagnosis not present

## 2015-02-12 DIAGNOSIS — Z880 Allergy status to penicillin: Secondary | ICD-10-CM | POA: Diagnosis not present

## 2015-02-12 DIAGNOSIS — I509 Heart failure, unspecified: Secondary | ICD-10-CM | POA: Diagnosis not present

## 2015-02-12 DIAGNOSIS — K219 Gastro-esophageal reflux disease without esophagitis: Secondary | ICD-10-CM | POA: Diagnosis not present

## 2015-02-12 DIAGNOSIS — R0602 Shortness of breath: Secondary | ICD-10-CM | POA: Diagnosis present

## 2015-02-12 DIAGNOSIS — E785 Hyperlipidemia, unspecified: Secondary | ICD-10-CM | POA: Diagnosis not present

## 2015-02-12 DIAGNOSIS — Z79899 Other long term (current) drug therapy: Secondary | ICD-10-CM | POA: Insufficient documentation

## 2015-02-12 LAB — CBC
HEMATOCRIT: 25.7 % — AB (ref 39.0–52.0)
Hemoglobin: 8.4 g/dL — ABNORMAL LOW (ref 13.0–17.0)
MCH: 24 pg — AB (ref 26.0–34.0)
MCHC: 32.7 g/dL (ref 30.0–36.0)
MCV: 73.4 fL — AB (ref 78.0–100.0)
Platelets: 123 10*3/uL — ABNORMAL LOW (ref 150–400)
RBC: 3.5 MIL/uL — AB (ref 4.22–5.81)
RDW: 15.4 % (ref 11.5–15.5)
WBC: 5.2 10*3/uL (ref 4.0–10.5)

## 2015-02-12 LAB — BASIC METABOLIC PANEL
Anion gap: 7 (ref 5–15)
BUN: 62 mg/dL — AB (ref 6–20)
CHLORIDE: 113 mmol/L — AB (ref 101–111)
CO2: 17 mmol/L — AB (ref 22–32)
Calcium: 8.1 mg/dL — ABNORMAL LOW (ref 8.9–10.3)
Creatinine, Ser: 3.74 mg/dL — ABNORMAL HIGH (ref 0.61–1.24)
GFR calc non Af Amer: 15 mL/min — ABNORMAL LOW (ref >60)
GFR, EST AFRICAN AMERICAN: 17 mL/min — AB (ref >60)
Glucose, Bld: 175 mg/dL — ABNORMAL HIGH (ref 65–99)
POTASSIUM: 4.6 mmol/L (ref 3.5–5.1)
SODIUM: 137 mmol/L (ref 135–145)

## 2015-02-12 LAB — BRAIN NATRIURETIC PEPTIDE: B NATRIURETIC PEPTIDE 5: 3694.4 pg/mL — AB (ref 0.0–100.0)

## 2015-02-12 LAB — I-STAT TROPONIN, ED: Troponin i, poc: 0.05 ng/mL (ref 0.00–0.08)

## 2015-02-12 LAB — TROPONIN I: TROPONIN I: 0.05 ng/mL — AB (ref <0.031)

## 2015-02-12 MED ORDER — FUROSEMIDE 10 MG/ML IJ SOLN
60.0000 mg | Freq: Once | INTRAMUSCULAR | Status: AC
  Administered 2015-02-12: 60 mg via INTRAVENOUS
  Filled 2015-02-12: qty 6

## 2015-02-12 MED ORDER — FUROSEMIDE 10 MG/ML IJ SOLN
120.0000 mg | Freq: Once | INTRAVENOUS | Status: AC
  Administered 2015-02-12: 120 mg via INTRAVENOUS
  Filled 2015-02-12: qty 12

## 2015-02-12 MED ORDER — FUROSEMIDE 40 MG PO TABS: 40.0000 mg | ORAL_TABLET | Freq: Every day | ORAL | Status: DC

## 2015-02-12 MED ORDER — ASPIRIN 325 MG PO TABS: 325.0000 mg | ORAL_TABLET | Freq: Once | ORAL | Status: DC

## 2015-02-12 NOTE — ED Notes (Signed)
Went in to get a set of discharge vital signs but patient refused stating "ive already had some done and im dressed and ready to go. No. Im not doing that"

## 2015-02-12 NOTE — ED Notes (Signed)
Pt reports having cardiac ablation done on 11/16 and having fatigue and sob since. Denies any cp. ekg done at triage.

## 2015-02-12 NOTE — ED Provider Notes (Signed)
CSN: [id number]     Arrival date & time 02/12/15  1435 History   First MD Initiated Contact with Patient 02/12/15 1523     Chief Complaint  Patient presents with  . Shortness of Breath     (Consider location/radiation/quality/duration/timing/severity/associated sxs/prior Treatment) Patient is a 69 y.o. male presenting with shortness of breath.  Shortness of Breath Severity:  Moderate Onset quality:  Gradual Duration:  1 week Timing:  Constant Progression:  Worsening Chronicity:  New Context: not activity, not animal exposure and not pollens   Relieved by:  None tried Worsened by:  Nothing tried Ineffective treatments:  None tried Associated symptoms: no abdominal pain, no chest pain, no fever and no headaches     Past Medical History  Diagnosis Date  . Hyperlipidemia 04/02/2006  . Essential hypertension 12/09/2006  . Benign prostatic hypertrophy with nocturia 07/07/2006  . Chronic venous insufficiency 04/12/2010  . Obesity (BMI 30.0-34.9) 01/15/2012  . Constipation 05/25/2009    Intermittent   . Microcytic normochromic anemia 05/27/2006  . Internal and external hemorrhoids without complication 3/43/7357  . Coronary artery disease 04/02/2006    s/p RCA DES 2006, low risk myoview in 2011  . Type 2 diabetes mellitus with neurological manifestations ([place]) 04/02/2006    Neuropathy of the left foot   . 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 Type 2 diabetes mellitus with peripheral artery disease ([place]) 05/27/2006    Absent pulses in the left foot   . Closed displaced fracture of left femoral neck ([place]) 06/14/2014    s/p left hip hemiarthroplasty June 14, 2014   . Atrial flutter ([place]) 07/26/2014    a. May 2016, CHA2DS2VASc = 4 -> Eliquis, spontaneous conversion to NSR;  b. On amio;  c. 01/2015 EPS: Unable to induce right sided Aflutter. Non-sustained Afib and Left  sided Aflutter noted.  [name] GERD (gastroesophageal reflux disease)   . Type 2 diabetes mellitus with stage 4 chronic kidney disease ([place]) 08/06/2012  . History of blood transfusion 05/2014    "related to hip OR"  . Degenerative joint disease involving multiple joints 02/02/2007  . Arthritis     "fingers" (02/07/2015)   Past Surgical History  Procedure Laterality Date  . Pilonidal cyst excision  1990's  . Refractive surgery Bilateral   . Fracture surgery    . Total hip arthroplasty Left 06/14/2014    Procedure: HEMI HIP ARTHROPLASTY ANTERIOR APPROACH;  Surgeon: Leandrew Koyanagi, MD;  Location: [facility];  Service: Orthopedics;  Laterality: Left;  . Joint replacement    . Leg amputation above knee Right ~ 2008  . Cholecystectomy N/A 12/12/2014    Procedure: LAPAROSCOPIC CHOLECYSTECTOMY WITH INTRAOPERATIVE CHOLANGIOGRAM;  Surgeon: Greer Pickerel, MD;  Location: [place];  Service: General;  Laterality: N/A;  . Electrophysiologic study N/A 02/07/2015    Procedure: A-Flutter Ablation;  Surgeon: Evans Lance, MD;  Location: [place] [facility];  Service: Cardiovascular;  Laterality: N/A;  . Tonsillectomy      "I think so; when I was a little boy"  . Coronary angioplasty with stent placement    . Prostate biopsy  ~ 2013   Family History  Problem Relation Age of Onset  . Hypertension Mother   . Heart attack Father   . Breast cancer Sister   . Arthritis Sister   . Heart attack Brother   . Diabetes Brother   .  Stroke Brother   . Pneumonia Daughter   . Diabetes Brother   . Alcoholism Brother   . Diabetes Brother   . Arthritis Sister     Bilateral knee replacement  . HIV Daughter   . Hypertension Daughter   . Drug abuse Daughter   . Schizophrenia Daughter   . Hypertension Daughter   . Drug abuse Daughter   . Bipolar disorder Daughter    Social History  Substance Use Topics  . Smoking status: Former Smoker -- 1.00 packs/day for 10 years    Types: Cigarettes    Quit date: 06/16/1968  . Smokeless  tobacco: Never Used  . Alcohol Use: 0.0 oz/week    0 Standard drinks or equivalent per week     Comment: "quit alcohol in the 1960's"    Review of Systems  Constitutional: Negative for fever and chills.  Eyes: Negative for pain.  Respiratory: Positive for shortness of breath. Negative for chest tightness.   Cardiovascular: Negative for chest pain.  Gastrointestinal: Negative for abdominal pain.  Neurological: Negative for headaches.  All other systems reviewed and are negative.     Allergies  Amoxicillin; Lisinopril; Pravastatin; Tamsulosin; and Doxycycline  Home Medications   Prior to Admission medications   Medication Sig Start Date End Date Taking? Authorizing Provider  ACCU-CHEK AVIVA PLUS test strip 1 strip by Does not apply route 2 (two) times daily. 11/16/14   Historical Provider, MD  ACCU-CHEK SOFTCLIX LANCETS lancets 1 each by Other route 2 (two) times daily. 11/16/14   Historical Provider, MD  amiodarone (PACERONE) 200 MG tablet Take 1 tablet (200 mg total) by mouth daily. 01/19/15   Oval Linsey, MD  amLODipine (NORVASC) 10 MG tablet Take 10 mg by mouth daily. 07/18/14   Historical Provider, MD  Blood Glucose Calibration (ACCU-CHEK AVIVA) SOLN 1 drop by Does not apply route as needed. 11/16/14   Historical Provider, MD  Blood Glucose Monitoring Suppl (ACCU-CHEK AVIVA PLUS) W/DEVICE KIT 1 cartridge by Does not apply route as needed. 11/16/14   Historical Provider, MD  calcium citrate-vitamin D (CITRACAL+D) 315-200 MG-UNIT per tablet Take 1 tablet by mouth daily.     Historical Provider, MD  carvedilol (COREG) 12.5 MG tablet Take 1 tablet (12.5 mg total) by mouth 2 (two) times daily with a meal. 01/19/15   Oval Linsey, MD  docusate sodium (COLACE) 100 MG capsule Take 100 mg by mouth 2 (two) times daily as needed (constipation).     Historical Provider, MD  ELIQUIS 5 MG TABS tablet Take 5 mg by mouth 2 (two) times daily. 01/15/15   Historical Provider, MD  famotidine  (PEPCID) 20 MG tablet Take 1 tablet (20 mg total) by mouth 2 (two) times daily. Patient taking differently: Take 20 mg by mouth 2 (two) times daily as needed for heartburn.  07/28/14   Lucious Groves, DO  fish oil-omega-3 fatty acids 1000 MG capsule Take 1 g by mouth daily. 01/15/12   Oval Linsey, MD  fluticasone (FLONASE) 50 MCG/ACT nasal spray Place 1 spray into both nostrils daily as needed for allergies. 01/19/15   Oval Linsey, MD  furosemide (LASIX) 40 MG tablet Take 40 mg by mouth daily as needed for fluid. Take 1 tablet by mouth daily as needed for a weight gain of more than 5 lbs in a week.    Historical Provider, MD  hydroxypropyl methylcellulose (ISOPTO TEARS) 2.5 % ophthalmic solution Place 2 drops into both eyes 4 (four) times daily as needed for dry  eyes.    Historical Provider, MD  Multiple Vitamins-Minerals (MULTIVITAMIN WITH MINERALS) tablet Take 1 tablet by mouth daily.    Historical Provider, MD  pravastatin (PRAVACHOL) 40 MG tablet Take 1 tablet (40 mg total) by mouth every evening. 10/13/14   Lelon Perla, MD  terazosin (HYTRIN) 10 MG capsule Take 1 capsule (10 mg total) by mouth at bedtime. 10/20/14   Oval Linsey, MD   BP 153/80 mmHg  Pulse 53  Temp(Src) 98.1 F (36.7 C)  Resp 18  SpO2 93% Physical Exam  Constitutional: He is oriented to person, place, and time. He appears well-developed and well-nourished.  HENT:  Head: Normocephalic and atraumatic.  Eyes: EOM are normal. Pupils are equal, round, and reactive to light.  Neck: Normal range of motion. Neck supple.  Cardiovascular: Normal rate.  Exam reveals no gallop and no friction rub.   No murmur heard. Pulmonary/Chest: Effort normal. No respiratory distress. He has wheezes. He has rales.  Abdominal: Soft. Bowel sounds are normal. He exhibits no distension. There is no tenderness. There is no rebound.  Musculoskeletal: Normal range of motion. He exhibits edema (LLE (prosthesis on right)).  Neurological: He  is alert and oriented to person, place, and time.  Nursing note and vitals reviewed.   ED Course  Procedures (including critical care time) Labs Review Labs Reviewed  BASIC METABOLIC PANEL  [place], ED    Imaging Review No results found. I have personally reviewed and evaluated these images and lab results as part of my medical decision-making.   EKG Interpretation None      MDM   Final diagnoses:  Congestive heart failure, unspecified congestive heart failure chronicity, unspecified congestive heart failure type [facility])   69 year old male history of CHF on a 6 presented to the emergency department with likely CHF exacerbation. Had been instructed in the past to take Lasix only as needed for leg swelling however does not take it regularly. Her last week or so a progressively worsening orthopnea. Had been seen by his cardiologist however last 2 days of progressively worsened she came here for evaluation. Exam as above. No evidence of pneumonia, COPD or other causes for his symptoms. I discussed admission with the patient he was very resistant to that. I discussed case with cardiology and cardiology also thought adMission was appropriate as he didn't have a great response to diuretics. However since the patient really wanted to go home a second dose of Lasix was given in the emergency department shortly after he had total of over 1 L of output. He had subjective improvement in symptoms. I discussed with the patient that I still thought admission was appropriate he wanted to go home. Before I could discharge him [place] patient left.      Merrily Pew, MD 02/15/15 [phone]

## 2015-02-12 NOTE — Telephone Encounter (Signed)
Spoke with pt wife, the pt had ablation last week and today he feels his breath is short. He is in a power chair and his usual activities are causing SOB that he has not had before. He denies edema or orthopnea. He does not feel his heart is racing. Will forward for dr Jens Somcrenshaw review

## 2015-02-12 NOTE — ED Notes (Signed)
Pt states he is breathing better and he wants to go home.  However, he will stay for ivp lasix.

## 2015-02-12 NOTE — Telephone Encounter (Signed)
Pt wife called back, she is taking him to the ER

## 2015-02-12 NOTE — Telephone Encounter (Signed)
[name]  

## 2015-02-23 ENCOUNTER — Ambulatory Visit: Payer: Medicare PPO | Admitting: Internal Medicine

## 2015-03-02 ENCOUNTER — Other Ambulatory Visit: Payer: Self-pay | Admitting: *Deleted

## 2015-03-02 DIAGNOSIS — I10 Essential (primary) hypertension: Secondary | ICD-10-CM

## 2015-03-05 MED ORDER — AMLODIPINE BESYLATE 10 MG PO TABS: 10.0000 mg | ORAL_TABLET | Freq: Every day | ORAL | Status: DC

## 2015-03-29 ENCOUNTER — Ambulatory Visit (INDEPENDENT_AMBULATORY_CARE_PROVIDER_SITE_OTHER): Payer: Medicare Other | Admitting: Internal Medicine

## 2015-03-29 ENCOUNTER — Encounter (HOSPITAL_COMMUNITY): Payer: Self-pay | Admitting: *Deleted

## 2015-03-29 ENCOUNTER — Encounter: Payer: Self-pay | Admitting: Internal Medicine

## 2015-03-29 ENCOUNTER — Inpatient Hospital Stay (HOSPITAL_COMMUNITY)
Admission: AD | Admit: 2015-03-29 | Discharge: 2015-04-02 | DRG: 291 | Disposition: A | Discharge status: Home Health | Payer: Medicare Other | Source: Ambulatory Visit | Attending: Internal Medicine | Admitting: Internal Medicine

## 2015-03-29 ENCOUNTER — Observation Stay (HOSPITAL_COMMUNITY): Payer: Medicare Other

## 2015-03-29 VITALS — BP 139/74 | HR 52 | Temp 97.6°F | Resp 20 | Ht 68.0 in | Wt 194.7 lb

## 2015-03-29 DIAGNOSIS — N189 Chronic kidney disease, unspecified: Secondary | ICD-10-CM

## 2015-03-29 DIAGNOSIS — E1122 Type 2 diabetes mellitus with diabetic chronic kidney disease: Secondary | ICD-10-CM | POA: Diagnosis present

## 2015-03-29 DIAGNOSIS — B1920 Unspecified viral hepatitis C without hepatic coma: Secondary | ICD-10-CM | POA: Diagnosis present

## 2015-03-29 DIAGNOSIS — R3129 Other microscopic hematuria: Secondary | ICD-10-CM | POA: Diagnosis present

## 2015-03-29 DIAGNOSIS — K219 Gastro-esophageal reflux disease without esophagitis: Secondary | ICD-10-CM | POA: Diagnosis present

## 2015-03-29 DIAGNOSIS — Z96642 Presence of left artificial hip joint: Secondary | ICD-10-CM | POA: Diagnosis present

## 2015-03-29 DIAGNOSIS — Z9114 Patient's other noncompliance with medication regimen: Secondary | ICD-10-CM

## 2015-03-29 DIAGNOSIS — I5022 Chronic systolic (congestive) heart failure: Secondary | ICD-10-CM

## 2015-03-29 DIAGNOSIS — I255 Ischemic cardiomyopathy: Secondary | ICD-10-CM | POA: Diagnosis present

## 2015-03-29 DIAGNOSIS — I272 Other secondary pulmonary hypertension: Secondary | ICD-10-CM | POA: Diagnosis present

## 2015-03-29 DIAGNOSIS — N401 Enlarged prostate with lower urinary tract symptoms: Secondary | ICD-10-CM | POA: Diagnosis present

## 2015-03-29 DIAGNOSIS — I2510 Atherosclerotic heart disease of native coronary artery without angina pectoris: Secondary | ICD-10-CM | POA: Diagnosis not present

## 2015-03-29 DIAGNOSIS — E1149 Type 2 diabetes mellitus with other diabetic neurological complication: Secondary | ICD-10-CM | POA: Diagnosis present

## 2015-03-29 DIAGNOSIS — Z89511 Acquired absence of right leg below knee: Secondary | ICD-10-CM

## 2015-03-29 DIAGNOSIS — E039 Hypothyroidism, unspecified: Secondary | ICD-10-CM | POA: Diagnosis present

## 2015-03-29 DIAGNOSIS — I872 Venous insufficiency (chronic) (peripheral): Secondary | ICD-10-CM | POA: Diagnosis present

## 2015-03-29 DIAGNOSIS — N400 Enlarged prostate without lower urinary tract symptoms: Secondary | ICD-10-CM

## 2015-03-29 DIAGNOSIS — N184 Chronic kidney disease, stage 4 (severe): Secondary | ICD-10-CM | POA: Diagnosis not present

## 2015-03-29 DIAGNOSIS — E785 Hyperlipidemia, unspecified: Secondary | ICD-10-CM | POA: Diagnosis present

## 2015-03-29 DIAGNOSIS — I132 Hypertensive heart and chronic kidney disease with heart failure and with stage 5 chronic kidney disease, or end stage renal disease: Secondary | ICD-10-CM | POA: Diagnosis not present

## 2015-03-29 DIAGNOSIS — E8770 Fluid overload, unspecified: Secondary | ICD-10-CM | POA: Diagnosis not present

## 2015-03-29 DIAGNOSIS — D891 Cryoglobulinemia: Secondary | ICD-10-CM | POA: Diagnosis present

## 2015-03-29 DIAGNOSIS — N185 Chronic kidney disease, stage 5: Secondary | ICD-10-CM

## 2015-03-29 DIAGNOSIS — I4892 Unspecified atrial flutter: Secondary | ICD-10-CM

## 2015-03-29 DIAGNOSIS — I5023 Acute on chronic systolic (congestive) heart failure: Secondary | ICD-10-CM

## 2015-03-29 DIAGNOSIS — N179 Acute kidney failure, unspecified: Secondary | ICD-10-CM | POA: Diagnosis present

## 2015-03-29 DIAGNOSIS — I509 Heart failure, unspecified: Secondary | ICD-10-CM

## 2015-03-29 DIAGNOSIS — Z87891 Personal history of nicotine dependence: Secondary | ICD-10-CM

## 2015-03-29 DIAGNOSIS — N186 End stage renal disease: Secondary | ICD-10-CM

## 2015-03-29 DIAGNOSIS — E1139 Type 2 diabetes mellitus with other diabetic ophthalmic complication: Secondary | ICD-10-CM | POA: Diagnosis present

## 2015-03-29 DIAGNOSIS — Z955 Presence of coronary angioplasty implant and graft: Secondary | ICD-10-CM

## 2015-03-29 DIAGNOSIS — Z7901 Long term (current) use of anticoagulants: Secondary | ICD-10-CM

## 2015-03-29 DIAGNOSIS — I739 Peripheral vascular disease, unspecified: Secondary | ICD-10-CM | POA: Diagnosis present

## 2015-03-29 DIAGNOSIS — I10 Essential (primary) hypertension: Secondary | ICD-10-CM | POA: Diagnosis present

## 2015-03-29 DIAGNOSIS — R0602 Shortness of breath: Secondary | ICD-10-CM

## 2015-03-29 DIAGNOSIS — D631 Anemia in chronic kidney disease: Secondary | ICD-10-CM | POA: Diagnosis present

## 2015-03-29 LAB — BASIC METABOLIC PANEL
ANION GAP: 8 (ref 5–15)
BUN: 112 mg/dL — ABNORMAL HIGH (ref 6–20)
CALCIUM: 8.5 mg/dL — AB (ref 8.9–10.3)
CO2: 19 mmol/L — AB (ref 22–32)
Chloride: 115 mmol/L — ABNORMAL HIGH (ref 101–111)
Creatinine, Ser: 4.88 mg/dL — ABNORMAL HIGH (ref 0.61–1.24)
GFR calc non Af Amer: 11 mL/min — ABNORMAL LOW (ref >60)
GFR, EST AFRICAN AMERICAN: 13 mL/min — AB (ref >60)
Glucose, Bld: 106 mg/dL — ABNORMAL HIGH (ref 65–99)
POTASSIUM: 4.6 mmol/L (ref 3.5–5.1)
Sodium: 142 mmol/L (ref 135–145)

## 2015-03-29 LAB — BRAIN NATRIURETIC PEPTIDE: B Natriuretic Peptide: >4500 pg/mL — ABNORMAL HIGH (ref 0.0–100.0)

## 2015-03-29 MED ORDER — FUROSEMIDE 10 MG/ML IJ SOLN
80.0000 mg | Freq: Once | INTRAMUSCULAR | Status: AC
  Administered 2015-03-29: 80 mg via INTRAVENOUS
  Filled 2015-03-29: qty 8

## 2015-03-29 MED ORDER — SODIUM CHLORIDE 0.9 % IJ SOLN: 3.0000 mL | Freq: Two times a day (BID) | INTRAMUSCULAR | Status: DC

## 2015-03-29 MED ORDER — SODIUM CHLORIDE 0.9 % IV SOLN: 250.0000 mL | INTRAVENOUS | Status: DC | PRN

## 2015-03-29 MED ORDER — FAMOTIDINE 20 MG PO TABS
20.0000 mg | ORAL_TABLET | Freq: Two times a day (BID) | ORAL | Status: DC
  Administered 2015-03-29 – 2015-03-30 (×2): 20 mg via ORAL
  Filled 2015-03-29 (×2): qty 1

## 2015-03-29 MED ORDER — ADULT MULTIVITAMIN W/MINERALS CH
1.0000 | ORAL_TABLET | Freq: Every day | ORAL | Status: DC
  Administered 2015-03-30 – 2015-04-02 (×4): 1 via ORAL
  Filled 2015-03-29 (×4): qty 1

## 2015-03-29 MED ORDER — FLUTICASONE PROPIONATE 50 MCG/ACT NA SUSP
1.0000 | Freq: Every day | NASAL | Status: DC | PRN
  Filled 2015-03-29: qty 16

## 2015-03-29 MED ORDER — APIXABAN 5 MG PO TABS
5.0000 mg | ORAL_TABLET | Freq: Two times a day (BID) | ORAL | Status: DC
  Administered 2015-03-29 – 2015-04-02 (×8): 5 mg via ORAL
  Filled 2015-03-29 (×8): qty 1

## 2015-03-29 MED ORDER — CALCIUM CITRATE-VITAMIN D 315-200 MG-UNIT PO TABS: 1.0000 | ORAL_TABLET | Freq: Every day | ORAL | Status: DC

## 2015-03-29 MED ORDER — CARVEDILOL 12.5 MG PO TABS
12.5000 mg | ORAL_TABLET | Freq: Two times a day (BID) | ORAL | Status: DC
  Administered 2015-03-29 – 2015-04-02 (×8): 12.5 mg via ORAL
  Filled 2015-03-29 (×8): qty 1

## 2015-03-29 MED ORDER — OMEGA-3-ACID ETHYL ESTERS 1 G PO CAPS
1.0000 g | ORAL_CAPSULE | Freq: Every day | ORAL | Status: DC
  Administered 2015-03-30 – 2015-04-02 (×4): 1 g via ORAL
  Filled 2015-03-29 (×4): qty 1

## 2015-03-29 MED ORDER — AMIODARONE HCL 200 MG PO TABS
200.0000 mg | ORAL_TABLET | Freq: Every day | ORAL | Status: DC
  Administered 2015-03-30 – 2015-04-02 (×4): 200 mg via ORAL
  Filled 2015-03-29 (×4): qty 1

## 2015-03-29 MED ORDER — SODIUM CHLORIDE 0.9 % IJ SOLN: 3.0000 mL | INTRAMUSCULAR | Status: DC | PRN

## 2015-03-29 MED ORDER — PRAVASTATIN SODIUM 40 MG PO TABS
40.0000 mg | ORAL_TABLET | Freq: Every evening | ORAL | Status: DC
Start: 2015-03-29 — End: 2015-03-31
  Administered 2015-03-29 – 2015-03-30 (×2): 40 mg via ORAL
  Filled 2015-03-29 (×2): qty 1

## 2015-03-29 MED ORDER — OMEGA-3 FATTY ACIDS 1000 MG PO CAPS: 1.0000 g | ORAL_CAPSULE | Freq: Every day | ORAL | Status: DC

## 2015-03-29 MED ORDER — DOCUSATE SODIUM 100 MG PO CAPS: 100.0000 mg | ORAL_CAPSULE | Freq: Every evening | ORAL | Status: DC | PRN

## 2015-03-29 MED ORDER — FUROSEMIDE 10 MG/ML IJ SOLN
60.0000 mg | Freq: Two times a day (BID) | INTRAMUSCULAR | Status: DC
  Administered 2015-03-30 – 2015-04-01 (×5): 60 mg via INTRAVENOUS
  Filled 2015-03-29 (×5): qty 6

## 2015-03-29 MED ORDER — TERAZOSIN HCL 5 MG PO CAPS
10.0000 mg | ORAL_CAPSULE | Freq: Every day | ORAL | Status: DC
  Administered 2015-03-29 – 2015-03-31 (×3): 10 mg via ORAL
  Filled 2015-03-29 (×4): qty 2

## 2015-03-29 MED ORDER — HEPARIN SODIUM (PORCINE) 5000 UNIT/ML IJ SOLN: 5000.0000 [IU] | Freq: Three times a day (TID) | INTRAMUSCULAR | Status: DC

## 2015-03-29 MED ORDER — CALCIUM CARBONATE-VITAMIN D 500-200 MG-UNIT PO TABS
1.0000 | ORAL_TABLET | Freq: Every day | ORAL | Status: DC
  Administered 2015-03-30 – 2015-04-02 (×4): 1 via ORAL
  Filled 2015-03-29 (×3): qty 1

## 2015-03-29 NOTE — H&P (Signed)
Date: 03/29/2015               Patient Name:  [name] [name] MRN: [id number]  DOB: 1945-08-27 Age / Sex: 70 y.o., male   PCP: Oval Linsey, MD         Medical Service: Internal Medicine Teaching Service         Attending Physician: Dr. Aldine Contes, MD    First Contact: Dr. Liberty Handy Pager: [phone]  Second Contact: Dr. Charlyne Mom Pager: [phone]       After Hours (After 5p/  First Contact Pager: [phone]  weekends / holidays): Second Contact Pager: [phone]   Chief Complaint: Dyspnea  History of Present Illness:   [name] [name] is a 70 year old man with a PMH of Stage 4 CKD, CHF (EF 50% May 2016) Atrial flutter, HTN, T2DM, CAD (RCA DES in 2006), HCV (untreated, normal RUQ [place]) who presents with shortness of breath. He was joined by his wife who helped relay the history.He reports at least two weeks of worsening orthopnea, paroxysmal nocturnal dyspnea, dyspnea on exertion, and lower extremity edema. The patient has as needed furosemide prescribed for weight gain over 5 pounds, but he has not taken it in the past two days. He says he has not needed any more pillows than usual, but he's noticed that he's been waking up in the middle of the night more often due to dyspnea. Associated symptoms have been a reduced appetite, generalized weakness, fatigue, and some confusion. He denied any chest pain, dizziness, light-headedness, blackouts, dysgeusia, abdominal pain, nausea, vomiting, diarrhea, vision changes, skin changes, or dysuria. He is a former smoker and no longer drinks. He lives at home with his wife.   He was evaluated in the [facility]. He was SpO2 99% on room air. In the setting of volume overload and his known CKD, a BMET was obtained which revealed a Cr of 4.88 (3.7 in November) and a BUN of 112. A BNP was obtained which was >4500 (3694 on 11/21, 497 on 5/3). In light of his worsening renal function, he was admitted to the IMTS.   Meds: Current  Facility-Administered Medications  Medication Dose Route Frequency Provider Last Rate Last Dose  . 0.9 %  sodium chloride infusion  250 mL Intravenous PRN Alexa Sherral Hammers, MD      . Derrill Memo ON 03/30/2015] amiodarone (PACERONE) tablet 200 mg  200 mg Oral Daily Norman Herrlich, MD      . apixaban Arne Cleveland) tablet 5 mg  5 mg Oral BID Norman Herrlich, MD      . Derrill Memo ON 03/30/2015] calcium-vitamin D (OSCAL WITH D) 500-200 MG-UNIT per tablet 1 tablet  1 tablet Oral Q breakfast Nischal Narendra, MD      . carvedilol (COREG) tablet 12.5 mg  12.5 mg Oral BID WC Norman Herrlich, MD      . docusate sodium (COLACE) capsule 100 mg  100 mg Oral QHS PRN Norman Herrlich, MD      . famotidine (PEPCID) tablet 20 mg  20 mg Oral BID Norman Herrlich, MD      . fluticasone [facility]) 50 MCG/ACT nasal spray 1 spray  1 spray Each Nare Daily PRN Norman Herrlich, MD      . Derrill Memo ON 03/30/2015] furosemide (LASIX) injection 60 mg  60 mg Intravenous BID Norman Herrlich, MD      . furosemide (LASIX) injection 80 mg  80 mg Intravenous Once Beverlee Nims  Charlott Rakes, MD      . Derrill Memo ON 03/30/2015] multivitamin with minerals tablet 1 tablet  1 tablet Oral Daily Norman Herrlich, MD      . Derrill Memo ON 03/30/2015] omega-3 acid ethyl esters (LOVAZA) capsule 1 g  1 g Oral Daily Nischal Narendra, MD      . pravastatin (PRAVACHOL) tablet 40 mg  40 mg Oral QPM Norman Herrlich, MD      . terazosin (HYTRIN) capsule 10 mg  10 mg Oral QHS Norman Herrlich, MD        Allergies: Allergies as of 03/29/2015 - Review Complete 03/29/2015  Allergen Reaction Noted  . Amoxicillin Swelling and Other (See Comments) 04/21/2012  . Lisinopril Other (See Comments) 11/22/2014  . Pravastatin Other (See Comments) 05/26/2014  . Tamsulosin Other (See Comments)   . Doxycycline Rash and Other (See Comments) 12/02/2011   Past Medical History  Diagnosis Date  . Hyperlipidemia 04/02/2006  . Essential hypertension 12/09/2006  . Benign prostatic hypertrophy with nocturia 07/07/2006  .  Chronic venous insufficiency 04/12/2010  . Obesity (BMI 30.0-34.9) 01/15/2012  . Constipation 05/25/2009    Intermittent   . Microcytic normochromic anemia 05/27/2006  . Internal and external hemorrhoids without complication 11/09/2991  . Coronary artery disease 04/02/2006    s/p RCA DES 2006, low risk myoview in 2011  . Type 2 diabetes mellitus with neurological manifestations ([place]) 04/02/2006    Neuropathy of the left foot   . 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 Type 2 diabetes mellitus with peripheral artery disease ([place]) 05/27/2006    Absent pulses in the left foot   . Closed displaced fracture of left femoral neck ([place]) 06/14/2014    s/p left hip hemiarthroplasty June 14, 2014   . Atrial flutter ([place]) 07/26/2014    a. May 2016, CHA2DS2VASc = 4 -> Eliquis, spontaneous conversion to NSR;  b. On amio;  c. 01/2015 EPS: Unable to induce right sided Aflutter. Non-sustained Afib and Left sided Aflutter noted.  [name] GERD (gastroesophageal reflux disease)   . Type 2 diabetes mellitus with stage 4 chronic kidney disease ([place]) 08/06/2012  . History of blood transfusion 05/2014    "related to hip OR"  . Degenerative joint disease involving multiple joints 02/02/2007  . Arthritis     "fingers" (02/07/2015)   Past Surgical History  Procedure Laterality Date  . Pilonidal cyst excision  1990's  . Refractive surgery Bilateral   . Fracture surgery    . Total hip arthroplasty Left 06/14/2014    Procedure: HEMI HIP ARTHROPLASTY ANTERIOR APPROACH;  Surgeon: Leandrew Koyanagi, MD;  Location: [place];  Service: Orthopedics;  Laterality: Left;  . Joint replacement    . Leg amputation above knee Right ~ 2008  . Cholecystectomy N/A 12/12/2014    Procedure: LAPAROSCOPIC CHOLECYSTECTOMY WITH INTRAOPERATIVE CHOLANGIOGRAM;  Surgeon: Greer Pickerel, MD;  Location: [place];  Service: General;  Laterality: N/A;   . Electrophysiologic study N/A 02/07/2015    Procedure: A-Flutter Ablation;  Surgeon: Evans Lance, MD;  Location: [facility];  Service: Cardiovascular;  Laterality: N/A;  . Tonsillectomy      "I think so; when I was a little boy"  . Coronary angioplasty with stent placement    . Prostate biopsy  ~ 2013   Family History  Problem Relation Age of Onset  . Hypertension Mother   .  Heart attack Father   . Breast cancer Sister   . Arthritis Sister   . Heart attack Brother   . Diabetes Brother   . Stroke Brother   . Pneumonia Daughter   . Diabetes Brother   . Alcoholism Brother   . Diabetes Brother   . Arthritis Sister     Bilateral knee replacement  . HIV Daughter   . Hypertension Daughter   . Drug abuse Daughter   . Schizophrenia Daughter   . Hypertension Daughter   . Drug abuse Daughter   . Bipolar disorder Daughter    Social History   Social History  . Marital Status: Married    Spouse Name: N/A  . Number of Children: N/A  . Years of Education: N/A   Occupational History  . Not on file.   Social History Main Topics  . Smoking status: Former Smoker -- 1.00 packs/day for 10 years    Types: Cigarettes    Quit date: 06/16/1968  . Smokeless tobacco: Never Used  . Alcohol Use: 0.0 oz/week    0 Standard drinks or equivalent per week     Comment: "quit alcohol in the 1960's"  . Drug Use: No  . Sexual Activity: Not Currently    Birth Control/ Protection: None   Other Topics Concern  . Not on file   Social History Narrative    Review of Systems: Negative except per HPI  Physical Exam: Blood pressure 114/63, pulse 60, temperature 97.6 F (36.4 C), temperature source Oral, resp. rate 18, height '5\' 8"'$  (1.727 m), weight 193 lb 5.5 oz (87.7 kg), SpO2 98 %. General: Sitting up in a chair, no acute distress.  HEENT: No scleral icterus, Moist mucous membranes, no tonsillar exudate or erythema Cardiovascular: RRR. No friction rub heard. No S3 or S4 Pulmonary:  Circles heard left lung base. No rhonchi or wheezes. Abdominal: Soft. NT/ND Extremities: 2+ pitting edema to knees on left. BKA on right. Neurological: Alert. Oriented to person, place, time, and president. No tremors or asterixis Skin: No jaundice. Warm and dry  Psychiatric: Normal affect and behavior.  Lab results: Basic Metabolic Panel:  Recent Labs  03/29/15 1042  NA 142  K 4.6  CL 115*  CO2 19*  GLUCOSE 106*  BUN 112*  CREATININE 4.88*  CALCIUM 8.5*     Assessment & Plan by Problem:  Volume Overload secondary to Acute versus Worsening CKD Stage 4: Based on creatinine, eGFR decrease from 29 (11/2014) to 20 (11/21) to 15 today. This may represent a gradual worsening in renal function rather than an acute injury, leading to volume overload. This reflects his gradually worsening symptoms. His CHF is also a factor, but his EF was relatively preserved as of 07/2014. We will diurese and consult nephrology. He may require a fistula placement before discharge in light of his worsening renal function. He does not appeared confused on my exam.  - 2-View CXR - Furosemide 80 mg IV once, then 60 mg IV BID - Nephrology consulted, appreciate recommendations - RFP in AM - Daily Weights - Strict I/O's - Admit to telemetry  CHF (EF 50%): BNP elevated, but could be due to worsening renal function. Could be contributor to volume overload - Furosemide per above  Atrial Flutter: Unsuccessful ablation 01/2015. Did not follow-up with Dr. Stanford Breed afterward - EKG - Amiodarone 200 mg daily - Eliquis 5 mg daily - Carvedilol 12.5 mg BID - Consider cardiology consult versus outpatient follow-up  CAD: RCA DES in 2006 -  Pravastatin 40 mg daily - Carvedilol 12.5 mg BID  BPH: Terazosin 10 mg daily  DVT Prophylaxis: Eliquis Code Status: Full  Dispo: Disposition is deferred at this time, awaiting improvement of current medical problems. Anticipated discharge in approximately 1-2 day(s).   The  patient does have a current PCP Oval Linsey, MD) and does need an [facility] hospital follow-up appointment after discharge.  The patient does have transportation limitations that hinder transportation to clinic appointments.  Signed: Liberty Handy, MD 03/29/2015, 5:05 PM

## 2015-03-29 NOTE — Progress Notes (Signed)
Subjective:    Patient ID: [name] [name], male    DOB: 09-Oct-1945, 70 y.o.   MRN: [id number]  HPI [name] [name] is a 70 y.o. male with PMHx of HTN, HLD, CAD, T2DM, GERD, Hepatitis C, hypothyroidism, Stage 4 CKD who presents to the clinic for ankle swelling. Please see A&P for the status of the patient's chronic medical problems.   Past Medical History  Diagnosis Date  . Hyperlipidemia 04/02/2006  . Essential hypertension 12/09/2006  . Benign prostatic hypertrophy with nocturia 07/07/2006  . Chronic venous insufficiency 04/12/2010  . Obesity (BMI 30.0-34.9) 01/15/2012  . Constipation 05/25/2009    Intermittent   . Microcytic normochromic anemia 05/27/2006  . Internal and external hemorrhoids without complication 08/20/2012  . Coronary artery disease 04/02/2006    s/p RCA DES 2006, low risk myoview in 2011  . 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 Type 2 diabetes mellitus with peripheral artery disease ([facility]) 05/27/2006    Absent pulses in the left foot   . Closed displaced fracture of left femoral neck ([facility]) 06/14/2014    s/p left hip hemiarthroplasty June 14, 2014   . Atrial flutter ([facility]) 07/26/2014    a. May 2016, CHA2DS2VASc = 4 -> Eliquis, spontaneous conversion to NSR;  b. On amio;  c. 01/2015 EPS: Unable to induce right sided Aflutter. Non-sustained Afib and Left sided Aflutter noted.  [name] GERD (gastroesophageal reflux disease)   . Type 2 diabetes mellitus with stage 4 chronic kidney disease ([facility]) 08/06/2012  . History of blood transfusion 05/2014    "related to hip OR"  . Degenerative joint disease involving multiple joints 02/02/2007  . Arthritis     "fingers" (02/07/2015)    Outpatient Encounter Prescriptions as of 03/29/2015  Medication Sig  .  amiodarone (PACERONE) 200 MG tablet Take 1 tablet (200 mg total) by mouth daily.  [name] amLODipine (NORVASC) 10 MG tablet Take 1 tablet (10 mg total) by mouth daily.  . calcium citrate-vitamin D (CITRACAL+D) 315-200 MG-UNIT per tablet Take 1 tablet by mouth daily.   . carvedilol (COREG) 12.5 MG tablet Take 1 tablet (12.5 mg total) by mouth 2 (two) times daily with a meal.  . docusate sodium (COLACE) 100 MG capsule Take 100 mg by mouth at bedtime as needed for moderate constipation (constipation).   [name] ELIQUIS 5 MG TABS tablet Take 5 mg by mouth 2 (two) times daily.  . famotidine (PEPCID) 20 MG tablet Take 1 tablet (20 mg total) by mouth 2 (two) times daily. (Patient taking differently: Take 20 mg by mouth 2 (two) times daily as needed for heartburn. )  . fish oil-omega-3 fatty acids 1000 MG capsule Take 1 g by mouth daily.  . fluticasone (FLONASE) 50 MCG/ACT nasal spray Place 1 spray into both nostrils daily as needed for allergies.  . furosemide (LASIX) 40 MG tablet Take 1 tablet (40 mg total) by mouth daily. Take 1 tablet by mouth daily as needed for a weight gain of more than 5 lbs in a week.  . hydroxypropyl methylcellulose (ISOPTO TEARS) 2.5 % ophthalmic solution Place 2 drops into both eyes 4 (four) times daily as needed for dry eyes.  . Multiple Vitamins-Minerals (MULTIVITAMIN WITH MINERALS) tablet Take 1 tablet by mouth daily.  [name]  pravastatin (PRAVACHOL) 40 MG tablet Take 1 tablet (40 mg total) by mouth every evening.  . terazosin (HYTRIN) 10 MG capsule Take 1 capsule (10 mg total) by mouth at bedtime.   No facility-administered encounter medications on file as of 03/29/2015.    Family History  Problem Relation Age of Onset  . Hypertension Mother   . Heart attack Father   . Breast cancer Sister   . Arthritis Sister   . Heart attack Brother   . Diabetes Brother   . Stroke Brother   . Pneumonia Daughter   . Diabetes Brother   . Alcoholism Brother   . Diabetes Brother   . Arthritis Sister      Bilateral knee replacement  . HIV Daughter   . Hypertension Daughter   . Drug abuse Daughter   . Schizophrenia Daughter   . Hypertension Daughter   . Drug abuse Daughter   . Bipolar disorder Daughter     Social History   Social History  . Marital Status: Married    Spouse Name: N/A  . Number of Children: N/A  . Years of Education: N/A   Occupational History  . Not on file.   Social History Main Topics  . Smoking status: Former Smoker -- 1.00 packs/day for 10 years    Types: Cigarettes    Quit date: 06/16/1968  . Smokeless tobacco: Never Used  . Alcohol Use: 0.0 oz/week    0 Standard drinks or equivalent per week     Comment: "quit alcohol in the 1960's"  . Drug Use: No  . Sexual Activity: Not Currently    Birth Control/ Protection: None   Other Topics Concern  . Not on file   Social History Narrative   Review of Systems General: Admits to decreased appetite, fatigue, weight gain. Denies fever, chills, and diaphoresis.  Respiratory: Admits to SOB, DOE, orthopnea. Denies cough, chest tightness, and wheezing.   Cardiovascular: Denies chest pain and palpitations.  Gastrointestinal: Denies nausea, vomiting, abdominal pain. Genitourinary: Denies dark urine, urgency, frequency MSK: Admits to lower extremity edema in left leg. Prosthetic on right. Neurological: Admits to weakness. Denies dizziness, headaches, lightheadedness Psychiatric/Behavioral: Admits to confusion.     Objective:   Physical Exam Filed Vitals:   03/29/15 0954  BP: 135/60  Pulse: 57  Temp: 97.6 F (36.4 C)  TempSrc: Oral  Resp: 20  Height: 5\' 8"  (1.727 m)  Weight: 194 lb 11.2 oz (88.315 kg)  SpO2: 95%   General: Vital signs reviewed.  Patient is chronically ill appearing, in no acute distress and cooperative with exam.  Neck: + JVD 3 cm above clavicle Cardiovascular: Bradycardic, regular rhythm Pulmonary/Chest: Inspiratory crackles up to mid lung field on left, mild inspiratory crackles  on right, no rhonchi or wheezes Abdominal: Soft, non-tender, non-distended, BS +.  Extremities: 1-2+ pitting lower extremity edema on left. Prosthetic in place on right, 2+ pedal pulses on left.  Skin: Warm, dry and intact.  Psychiatric: Cognition and memory are abnormal.      Assessment & Plan:   Please see problem based assessment and plan.

## 2015-03-29 NOTE — Progress Notes (Signed)
New Admission Note:   Arrival Method: Wheelchair  Mental Orientation:Alert and Oriented x4 Telemetry: Initiated Assessment: Completed Skin: intact IV: Left forearm Pain: no complaints Tubes: None  Safety Measures: Safety Fall Prevention Plan has been given, discussed and signed Admission: In process.  [address] Orientation: Patient has been orientated to the room, unit and staff.  Family:  Orders have been reviewed and implemented. Will continue to monitor the patient. Call light has been placed within reach and bed alarm has been activated.   Riesa PopeJasmine Sion Reinders BSN, RN Phone number: [phone]

## 2015-03-29 NOTE — Assessment & Plan Note (Signed)
Patient underwent successful ablation for his atrial flutter in November 2016. He is regular rhythm on examination today. Patient is normally compliant with his eliquis, amiodarone and carvedilol, but did not take it in the last 2 days. Patient was supposed to follow up with Dr. Jens Somrenshaw after his ablation, but has not yet set up this appointment.  Plan: -Continue amiodarone 200 mg daily -Continue Eliquis 5 mg BID -Continue Carvedilol 12.5 mg BID

## 2015-03-29 NOTE — Assessment & Plan Note (Signed)
Patient presented with a 2 week history of worsening orthopnea, shortness of breath, weight gain and left lower extremity edema. Patient's most recent echo in May 2016 showed an EF of 50%. Patient is on carvedilol 12.5 mg BID and lasix 40 mg as needed. He rarely takes lasix may once a week or so based on his left leg edema (he is s/p BKA of right lower extremity). He does not have a scale at home; therefore, he does NOT take an additional lasix if he gains greater than 5 pounds. Overall, patient has been compliant with his medications, but states he has not taken anything in the last 2 days as he felt fatigued and attributed it to his medications. Patient admits to fatigue, weakness and some confusion. Physical exam is concerning for volume overload and CHF exacerbation given his weight gain, pitting edema, +JVD, and crackles on examination. However, given his worsening kidney function, confusion, fatigue and poor appetite, there was concern for uremia and renal failure. STAT BMET was obtained which revealed BUN 112, Creatinine 4.88 (up from 3.7 in November). Etiology of symptoms is likely due to Acute on Chronic Kidney Disease and CHF exacerbation in the setting of volume overload and recent non-compliance with medications.   Plan: -Admit to Telemetry -Diurese -Consult Nephrology (see CKD) -Consider consult to Cardiology, or at least have patient follow up with Cardiology on d/c given recent ablation and no follow up -BNP pending -Will obtain CXR, CBC

## 2015-03-29 NOTE — Assessment & Plan Note (Addendum)
Patient has CKD with baseline creatinine around 1.5-2 from 2012 to 2015. It appears creatinine has been trending up over the last one year. Most recent creatinine 3.74 in November 2016 and GFR 17. Patient states he has not been seen by Nephrology. His current symptoms are likely d/t volume overload from CHF; however, his fatigue is also concerning for uremia. We will check a stat BMET today in clinic to check his current renal function. Patient will likely need to be referred to Nephrology in the near future. Patient has a follow up appointment in January 2017 with his PCP.   Plan: -STAT BMET  Addendum: BMET revealed acute kidney injury on chronic kidney disease  with creatinine of 4.88 and BUN of 112. Patient is likely becoming uremic from his renal failure. Part of his failure may be due to medications and from volume overload. During admission, I would try to diurese the patient first to see how his creatinine responds. Also, I would consult nephrology given his worsening renal failure over the last several years.

## 2015-03-30 ENCOUNTER — Observation Stay (HOSPITAL_COMMUNITY): Payer: Medicare Other

## 2015-03-30 ENCOUNTER — Ambulatory Visit (HOSPITAL_COMMUNITY): Admission: RE | Admit: 2015-03-30 | Payer: Medicare Other | Source: Ambulatory Visit

## 2015-03-30 DIAGNOSIS — D649 Anemia, unspecified: Secondary | ICD-10-CM

## 2015-03-30 DIAGNOSIS — I10 Essential (primary) hypertension: Secondary | ICD-10-CM

## 2015-03-30 DIAGNOSIS — E1122 Type 2 diabetes mellitus with diabetic chronic kidney disease: Secondary | ICD-10-CM | POA: Diagnosis not present

## 2015-03-30 DIAGNOSIS — I509 Heart failure, unspecified: Secondary | ICD-10-CM | POA: Diagnosis not present

## 2015-03-30 DIAGNOSIS — N179 Acute kidney failure, unspecified: Secondary | ICD-10-CM | POA: Diagnosis not present

## 2015-03-30 DIAGNOSIS — N189 Chronic kidney disease, unspecified: Secondary | ICD-10-CM

## 2015-03-30 DIAGNOSIS — N184 Chronic kidney disease, stage 4 (severe): Secondary | ICD-10-CM

## 2015-03-30 DIAGNOSIS — E8770 Fluid overload, unspecified: Secondary | ICD-10-CM | POA: Diagnosis not present

## 2015-03-30 DIAGNOSIS — Z794 Long term (current) use of insulin: Secondary | ICD-10-CM

## 2015-03-30 DIAGNOSIS — R0602 Shortness of breath: Secondary | ICD-10-CM | POA: Diagnosis not present

## 2015-03-30 LAB — CBC
HEMATOCRIT: 26.7 % — AB (ref 39.0–52.0)
HEMOGLOBIN: 8.5 g/dL — AB (ref 13.0–17.0)
MCH: 22.4 pg — AB (ref 26.0–34.0)
MCHC: 31.8 g/dL (ref 30.0–36.0)
MCV: 70.3 fL — ABNORMAL LOW (ref 78.0–100.0)
Platelets: 115 10*3/uL — ABNORMAL LOW (ref 150–400)
RBC: 3.8 MIL/uL — AB (ref 4.22–5.81)
RDW: 16.2 % — ABNORMAL HIGH (ref 11.5–15.5)
WBC: 5.1 10*3/uL (ref 4.0–10.5)

## 2015-03-30 LAB — RENAL FUNCTION PANEL
ANION GAP: 9 (ref 5–15)
Albumin: 2.8 g/dL — ABNORMAL LOW (ref 3.5–5.0)
BUN: 109 mg/dL — ABNORMAL HIGH (ref 6–20)
CALCIUM: 8.7 mg/dL — AB (ref 8.9–10.3)
CHLORIDE: 116 mmol/L — AB (ref 101–111)
CO2: 19 mmol/L — AB (ref 22–32)
CREATININE: 4.84 mg/dL — AB (ref 0.61–1.24)
GFR, EST AFRICAN AMERICAN: 13 mL/min — AB (ref >60)
GFR, EST NON AFRICAN AMERICAN: 11 mL/min — AB (ref >60)
Glucose, Bld: 93 mg/dL (ref 65–99)
Phosphorus: 5.3 mg/dL — ABNORMAL HIGH (ref 2.5–4.6)
Potassium: 4.5 mmol/L (ref 3.5–5.1)
SODIUM: 144 mmol/L (ref 135–145)

## 2015-03-30 LAB — TSH: TSH: 18.534 u[IU]/mL — ABNORMAL HIGH (ref 0.350–4.500)

## 2015-03-30 LAB — TROPONIN I
Troponin I: <0.03 ng/mL (ref <0.031)
Troponin I: 0.03 ng/mL (ref <0.031)

## 2015-03-30 LAB — T4, FREE: Free T4: 0.7 ng/dL (ref 0.61–1.12)

## 2015-03-30 MED ORDER — ONDANSETRON HCL 4 MG/2ML IJ SOLN
4.0000 mg | Freq: Three times a day (TID) | INTRAMUSCULAR | Status: DC | PRN
Start: 2015-03-30 — End: 2015-04-02
  Administered 2015-03-30 – 2015-04-02 (×2): 4 mg via INTRAVENOUS
  Filled 2015-03-30 (×2): qty 2

## 2015-03-30 MED ORDER — ONDANSETRON HCL 4 MG PO TABS: 4.0000 mg | ORAL_TABLET | Freq: Three times a day (TID) | ORAL | Status: DC | PRN

## 2015-03-30 MED ORDER — ONDANSETRON HCL 4 MG/2ML IJ SOLN: 4.0000 mg | Freq: Once | INTRAMUSCULAR | Status: DC

## 2015-03-30 MED ORDER — FAMOTIDINE 20 MG PO TABS
20.0000 mg | ORAL_TABLET | Freq: Every day | ORAL | Status: DC
  Administered 2015-03-31 – 2015-04-02 (×2): 20 mg via ORAL
  Filled 2015-03-30 (×2): qty 1

## 2015-03-30 NOTE — Progress Notes (Signed)
Internal Medicine Clinic Attending  Case discussed with Dr. Richardson at the time of the visit.  We reviewed the resident's history and exam and pertinent patient test results.  I agree with the assessment, diagnosis, and plan of care documented in the resident's note. 

## 2015-03-30 NOTE — Consult Note (Signed)
[facility] Consultation Note Requesting Physician:  Dr. Ala Dach, IM Teaching Service Reason for Consult:  CKD HPI: The patient is a 70 y.o. year-old male with a background of  DM2 (no longer on insulin), HTN, HLD, PAD with R AKA, CAD with prior stenting, systolic heart failure (LVEF 50% 07/2014), atrial fib/flutter, chronic hepatitis C (never treated).  He was admitted to the hospital with progressive dyspnea, edema, weight gain, volume overload->acutye on chronic heart failure. Creatinine on admission was noted to be 4.88.    He has never been seen by Nephrology.  On chart review, he has had elevated creatinines dating back at least 5 years, and has had dipstick proteinuria since at least 2007.  I also not the presence of microscopic hematuria since 05/2014.  Prior workup from [facility] records includes a negative SPEP/UPEP in 2008, ultrasound in 2014 that showed 10.5, 11.2 cm kidneys.  He is Hep C + with a viral load of 4,720,000 that has never been treated (11/2014).  Appears his CKD has been progressive from Stage 3 to Stage 4/5 over the course of 2016 and abruptly worse in the context of his current admission.  He has anemia with Hb of 8.5 and a mild elevation of PTH (107 11/2014). We are asked to see and evaluate.  Mr. Fife has noted edema in his left leg (has R prosthesis) for some time, has had some worsening abd distension. He is not presently SOB. Has not noted tea, coca cola colored or blood urine, no sudsy urine. Does no use NSAIDS. Says he was not aware that he had advanced kidney disease.  His stepfather was a dialysis patient. Pt states without reservation that he would not/will not ever do dialysis  Creatinine trending for reference is as follows: 03/30/2015 05:55 AM 4.84* 0.61 - 1.24 mg/dL Final  16/12/9602 54:09 AM 4.88* 0.61 - 1.24 mg/dL Final  81/19/1478 29:56 PM 3.74* 0.61 - 1.24 mg/dL Final  21/30/8657 8.46    12/20/2014 10:52 AM 2.74* 0.76 - 1.27 mg/dL Final  96/29/5284  13:24 AM 2.83* 0.61 - 1.24 mg/dL Final  40/12/2723 36:64 AM 2.80* 0.61 - 1.24 mg/dL Final  40/34/7425 95:63 AM 2.90* 0.61 - 1.24 mg/dL Final  87/56/4332 95:18 AM 3.31* 0.61 - 1.24 mg/dL Final  84/16/6063 01:60 AM 3.30* 0.40 - 1.50 mg/dL Final  10/93/2355 73:22 AM 2.59* 0.76 - 1.27 mg/dL Final  02/54/2706 23:76 PM 2.46* 0.76 - 1.27 mg/dL Final  28/31/5176 16:07 AM 2.05* 0.61 - 1.24 mg/dL Final  37/12/6267 48:54 AM 1.77* 0.61 - 1.24 mg/dL Final  62/70/3500 93:81 AM 1.97* 0.61 - 1.24 mg/dL Final  82/99/3716 96:78 PM 2.40* 0.61 - 1.24 mg/dL Final  93/81/0175 10:25 PM 2.60* 0.61 - 1.24 mg/dL Final  85/27/7824 23:53 AM 2.23* 0.50 - 1.35 mg/dL Final  61/44/3154 00:86 PM 2.27* 0.50 - 1.35 mg/dL Final  76/19/5093 26:71 AM 2.37* 0.50 - 1.35 mg/dL Final  24/58/0998 33:82 AM 2.19* 0.50 - 1.35 mg/dL Final  50/53/9767 34:19 AM 2.16* 0.50 - 1.35 mg/dL Final  37/90/2409 73:53 PM 2.37* 0.50 - 1.35 mg/dL Final  29/92/4268 34:19 AM 1.62* 0.50 - 1.35 mg/dL Final  62/22/9798 92:11 PM 1.77* 0.50 - 1.35 mg/dL Final  94/17/4081 44:81 AM 1.64* 0.50 - 1.35 mg/dL Final  85/63/1497 02:63 AM 1.62* 0.50 - 1.35 mg/dL Final  78/58/8502 77:41 AM 1.82* 0.50 - 1.35 mg/dL Final  28/78/6767 20:94 PM 1.99* 0.50 - 1.35 mg/dL Final  70/96/2836 62:94 PM 1.42 0.40-1.50 mg/dL Final  76/54/6503 54:65  PM 1.31 0.40-1.50 mg/dL Final     Past Medical History  Diagnosis Date  . Hyperlipidemia 04/02/2006  . Essential hypertension 12/09/2006  . Benign prostatic hypertrophy with nocturia 07/07/2006  . Chronic venous insufficiency 04/12/2010  . Obesity (BMI 30.0-34.9) 01/15/2012  . Constipation 05/25/2009    Intermittent   . Microcytic normochromic anemia 05/27/2006  . Internal and external hemorrhoids without complication 08/20/2012  . Coronary artery disease 04/02/2006    s/p RCA DES 2006, low risk myoview in 2011  . 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 Type 2  diabetes mellitus with ophthalmic manifestations (HCC) 04/02/2006    s/p laser surgery for severe diabetic  bilateral non-proliferative retinopathy (2013)    . Type 2 diabetes mellitus with peripheral artery disease (HCC) 05/27/2006    Absent pulses in the left foot   . Closed displaced fracture of left femoral neck (HCC) 06/14/2014    s/p left hip hemiarthroplasty June 14, 2014   . Atrial flutter (HCC) 07/26/2014    a. May 2016, CHA2DS2VASc = 4 -> Eliquis, spontaneous conversion to NSR;  b. On amio;  c. 01/2015 EPS: Unable to induce right sided Aflutter. Non-sustained Afib and Left sided Aflutter noted.  [name] GERD (gastroesophageal reflux disease)   . Type 2 diabetes mellitus with stage 4 chronic kidney disease (HCC) 08/06/2012  . History of blood transfusion 05/2014    "related to hip OR"  . Degenerative joint disease involving multiple joints 02/02/2007  . Arthritis     "fingers" (02/07/2015)     Past Surgical History  Procedure Laterality Date  . Pilonidal cyst excision  1990's  . Refractive surgery Bilateral   . Fracture surgery    . Total hip arthroplasty Left 06/14/2014    Procedure: HEMI HIP ARTHROPLASTY ANTERIOR APPROACH;  Surgeon: Tarry Kos, MD;  Location: [facility] OR;  Service: Orthopedics;  Laterality: Left;  . Joint replacement    . Leg amputation above knee Right ~ 2008  . Cholecystectomy N/A 12/12/2014    Procedure: LAPAROSCOPIC CHOLECYSTECTOMY WITH INTRAOPERATIVE CHOLANGIOGRAM;  Surgeon: Gaynelle Adu, MD;  Location: [facility] OR;  Service: General;  Laterality: N/A;  . Electrophysiologic study N/A 02/07/2015    Procedure: A-Flutter Ablation;  Surgeon: Marinus Maw, MD;  Location: [facility] INVASIVE CV LAB;  Service: Cardiovascular;  Laterality: N/A;  . Tonsillectomy      "I think so; when I was a little boy"  . Coronary angioplasty with stent placement    . Prostate biopsy  ~ 2013     Family History  Problem Relation Age of Onset  . Hypertension Mother   . Heart attack Father   . Breast  cancer Sister   . Arthritis Sister   . Heart attack Brother   . Diabetes Brother   . Stroke Brother   . Pneumonia Daughter   . Diabetes Brother   . Alcoholism Brother   . Diabetes Brother   . Arthritis Sister     Bilateral knee replacement  . HIV Daughter   . Hypertension Daughter   . Drug abuse Daughter   . Schizophrenia Daughter   . Hypertension Daughter   . Drug abuse Daughter   . Bipolar disorder Daughter    Social History:  reports that he quit smoking about 46 years ago. His smoking use included Cigarettes. He has a 10 pack-year smoking history. He has never used smokeless tobacco. He reports that he does not  drink alcohol or use illicit drugs.  Allergies:  Allergies  Allergen Reactions  . Amoxicillin Swelling and Other (See Comments)    Puffy eyes and abdominal pain with Amoxicillin PO  . Lisinopril Other (See Comments)    Acute kidney injury  . Pravastatin Other (See Comments)    Weakness and fatigue  . Tamsulosin Other (See Comments)    REACTION: dry throat, sweating, blurred vision  . Doxycycline Rash and Other (See Comments)    Questionable drug rxn rash    Home medications: Prior to Admission medications   Medication Sig Start Date End Date Taking? Authorizing Provider  amiodarone (PACERONE) 200 MG tablet Take 1 tablet (200 mg total) by mouth daily. 01/19/15  Yes Doneen PoissonLawrence Klima, MD  amLODipine (NORVASC) 10 MG tablet Take 1 tablet (10 mg total) by mouth daily. 03/05/15  Yes Doneen PoissonLawrence Klima, MD  calcium citrate-vitamin D (CITRACAL+D) 315-200 MG-UNIT per tablet Take 1 tablet by mouth daily.    Yes Historical Provider, MD  carvedilol (COREG) 12.5 MG tablet Take 1 tablet (12.5 mg total) by mouth 2 (two) times daily with a meal. 01/19/15  Yes Doneen PoissonLawrence Klima, MD  docusate sodium (COLACE) 100 MG capsule Take 100 mg by mouth at bedtime as needed for moderate constipation (constipation).    Yes Historical Provider, MD  ELIQUIS 5 MG TABS tablet Take 5 mg by mouth 2 (two)  times daily. 01/15/15  Yes Historical Provider, MD  famotidine (PEPCID) 20 MG tablet Take 1 tablet (20 mg total) by mouth 2 (two) times daily. Patient taking differently: Take 20 mg by mouth 2 (two) times daily as needed for heartburn.  07/28/14  Yes Gust RungErik C Hoffman, DO  fish oil-omega-3 fatty acids 1000 MG capsule Take 1 g by mouth daily. 01/15/12  Yes Doneen PoissonLawrence Klima, MD  fluticasone (FLONASE) 50 MCG/ACT nasal spray Place 1 spray into both nostrils daily as needed for allergies. 01/19/15  Yes Doneen PoissonLawrence Klima, MD  furosemide (LASIX) 40 MG tablet Take 1 tablet (40 mg total) by mouth daily. Take 1 tablet by mouth daily as needed for a weight gain of more than 5 lbs in a week. 02/12/15  Yes Marily MemosJason Mesner, MD  hydroxypropyl methylcellulose (ISOPTO TEARS) 2.5 % ophthalmic solution Place 2 drops into both eyes 4 (four) times daily as needed for dry eyes.   Yes Historical Provider, MD  Multiple Vitamins-Minerals (MULTIVITAMIN WITH MINERALS) tablet Take 1 tablet by mouth daily.   Yes Historical Provider, MD  terazosin (HYTRIN) 10 MG capsule Take 1 capsule (10 mg total) by mouth at bedtime. 10/20/14  Yes Doneen PoissonLawrence Klima, MD    Inpatient medications: . amiodarone  200 mg Oral Daily  . apixaban  5 mg Oral BID  . calcium-vitamin D  1 tablet Oral Q breakfast  . carvedilol  12.5 mg Oral BID WC  . famotidine  20 mg Oral BID  . furosemide  60 mg Intravenous BID  . multivitamin with minerals  1 tablet Oral Daily  . omega-3 acid ethyl esters  1 g Oral Daily  . pravastatin  40 mg Oral QPM  . terazosin  10 mg Oral QHS    Review of Systems + SOB PTA (denies at this time) PND/orthopnea + worsening of chronic LLE edema + abd distension No nausea or vomiting No loss of appetite - says "I just don't eat every time I turn around any more" No tea or coca cola colored urine, no hematuria, no sudsy uring    Physical Exam:  BP 142/65  mmHg  Pulse 56  Temp(Src) 98.4 F (36.9 C) (Oral)  Resp 18  Ht 5\' 8"  (1.727 m)   Wt 80 kg (176 lb 5.9 oz)  BMI 26.82 kg/m2  SpO2 97%  Gen: Very noce soft spoken older AAM  NAD sitting in the chair wearing his street clothes Skin: no rash, cyanosis Neck: JVP 5-6 cm Chest: Crackles both bases Heart: Regular, S1S2 no S3 Abdomen: Protuberant, no focal tenderness. + BS, no bruits Ext: Wearing RLE prosthesis. L+ LLE edema pitting Neuro: alert, Ox3, no focal deficit No asterixus   Labs: Basic Metabolic Panel:  Recent Labs Lab 03/29/15 1042 03/30/15 0555  NA 142 144  K 4.6 4.5  CL 115* 116*  CO2 19* 19*  GLUCOSE 106* 93  BUN 112* 109*  CREATININE 4.88* 4.84*  CALCIUM 8.5* 8.7*  PHOS  --  5.3*     Recent Labs Lab 03/30/15 0555  ALBUMIN 2.8*   Recent Labs Lab 03/30/15 0555  WBC 5.1  HGB 8.5*  HCT 26.7*  MCV 70.3*  PLT 115*     Recent Labs Lab 03/30/15 0757  TROPONINI <0.03    Xrays/Other Studies: X-ray Chest Pa And Lateral  03/29/2015  CLINICAL DATA:  CHF exacerbation. EXAM: CHEST  2 VIEW COMPARISON:  02/12/2015 FINDINGS: Lordotic technique is demonstrated. Lungs are hypoinflated and demonstrate small bilateral pleural effusions right greater than left without significant change. Likely associated bibasilar atelectasis. Stable moderate cardiomegaly. Improved perihilar prominence likely mild residual vascular congestion. Remainder of the exam is unchanged. IMPRESSION: Stable cardiomegaly with improved mild vascular congestion. Small bilateral pleural effusions right greater left without significant change. Electronically Signed   By: Elberta Fortis M.D.   On: 03/29/2015 17:14    Background 70 y.o. year-old AA male with a background of  DM2 (no longer on insulin), HTN, HLD, PAD with R AKA, CAD with prior stenting, systolic heart failure (LVEF 50% 07/2014), atrial fib/flutter, chronic hepatitis C (never treated).  He was admitted to the hospital with progressive dyspnea, edema, weight gain, volume overload->acutye on chronic heart failure.  Creatinine on admission was noted to be 4.88.   On chart review, he has had elevated creatinines dating back at least 5 years, and has had dipstick proteinuria since at least 2007.  I also not the presence of microscopic hematuria since 11/2014.  Prior workup from [facility] records includes a negative SPEP/UPEP in 2008, ultrasound in 2014 that showed 10.5, 11.2 cm kidneys.  He is Hep C + with a viral load of 4,720,000 that has never been treated (11/2014).  Appears his CKD has been progressive from Stage 3 to Stage 4/5 over the course of 2016 and abruptly worse in the context of his current admission.  He has anemia with Hb of 8.5 and a mild elevation of PTH (107 11/2014). We are asked to see and evaluate.  Impression/Plan  1. Subacute/acute AKI - more rapidly progressive over the past 6 months, longstanding proteinuria (>7 years) which could be compatible with diabetic nephropathy, but with the appearance of microscopic hematuria on recent UA which raises question of an underlying glomerulonephritis causing the more rapid subacute loss of renal function (which may have been acutely compounded by his heart  failure). 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  Will need to investigate for reversible/treatable causes but he is at a rather advanced stage already. Have ordered urine and blood studies, might consider  a renal biopsy. He indicates that he will never undergo dialysis. 2. Fluid overload - Agree with diuresis.  3. Anemia -  Check Fe studies. Replete if low. Might benefit from Aranesp. 4. CAD - per cards 5. DM - previously on insulin. Came off insulin most likely d/t longer insulin t 1/2 with worsening renal failure.  Thanks for the consult - will follow with you.  Camille Bal,  MD [facility] [phone] pager 03/30/2015, 1:38 PM

## 2015-03-30 NOTE — Discharge Instructions (Addendum)
[name], it was as pleasure taking care of you in the hospital. You will continue to take Lasix 60 mg daily (3 tablets of 20 mg). You have three appointments to go to this month. One is in the [facility] with Dr. Ladona Ridgelaylor for general follow-up. Another is with the heart doctor to address your heart function. Someone from [facility] will also be contacting you to set up an appointment. All of this is listed under "Follow Up" in your discharge paperwork. If you develop weight gain >5 lbs, worsening shortness of breath, or chest pain, please seek immediate medical attention.   Information on my medicine - ELIQUIS (apixaban)  This medication education was reviewed with me or my healthcare representative as part of my discharge preparation.  The pharmacist that spoke with me during my hospital stay was:  Dennie Fettersgan, Theresa Donovan, [facility]  Why was Eliquis prescribed for you? Eliquis was prescribed for you to reduce the risk of a blood clot forming that can cause a stroke if you have a medical condition called atrial fibrillation (a type of irregular heartbeat).  What do You need to know about Eliquis ? Take your Eliquis TWICE DAILY - one tablet in the morning and one tablet in the evening with or without food. If you have difficulty swallowing the tablet whole please discuss with your pharmacist how to take the medication safely.  Take Eliquis exactly as prescribed by your doctor and DO NOT stop taking Eliquis without talking to the doctor who prescribed the medication.  Stopping may increase your risk of developing a stroke.  Refill your prescription before you run out.  After discharge, you should have regular check-up appointments with your healthcare provider that is prescribing your Eliquis.  In the future your dose may need to be changed if your kidney function or weight changes by a significant amount or as you get older.  What do you do if you miss a dose? If you miss a dose,  take it as soon as you remember on the same day and resume taking twice daily.  Do not take more than one dose of ELIQUIS at the same time to make up a missed dose.  Important Safety Information A possible side effect of Eliquis is bleeding. You should call your healthcare provider right away if you experience any of the following: ? Bleeding from an injury or your nose that does not stop. ? Unusual colored urine (red or dark brown) or unusual colored stools (red or black). ? Unusual bruising for unknown reasons. ? A serious fall or if you hit your head (even if there is no bleeding).  Some medicines may interact with Eliquis and might increase your risk of bleeding or clotting while on Eliquis. To help avoid this, consult your healthcare provider or pharmacist prior to using any new prescription or non-prescription medications, including herbals, vitamins, non-steroidal anti-inflammatory drugs (NSAIDs) and supplements.  This website has more information on Eliquis (apixaban): http://www.eliquis.com/eliquis/home

## 2015-03-30 NOTE — Evaluation (Signed)
Physical Therapy Evaluation Patient Details Name: [name] [name] MRN: [id number] DOB: 04/13/1945 Today's Date: 03/30/2015   History of Present Illness  70 y/o male with PMH of CKD stage 4, chronic systolic CHF (EF 78%50%), aflutter, HTN, DM, CAD s/p RCA DES, HCV who p/w worsening SOB over the past 2 weeks. Patient complains of orthopnea, PND, DOE and worsening L LE edema.  Clinical Impression  Pt admitted with the above complications. Pt currently with functional limitations due to the deficits listed below (see PT Problem List). Required additional assistance with mobility from his typical Mod I baseline. Fatigues easily with 2/4 dyspnea on exertion, SpO2 93% on room air. HR up to 60, 58 bpm at rest. States wife can assist as needed at home and uses power wheelchair for mobility in and out of home. Able to drive and active in community. NO chest pain with activity. Pt will benefit from skilled PT to increase their independence and safety with mobility to allow discharge to the venue listed below.       Follow Up Recommendations Home health PT;Supervision for mobility/OOB    Equipment Recommendations  None recommended by PT    Recommendations for Other Services       Precautions / Restrictions Precautions Precautions: Fall Required Braces or Orthoses: Other Brace/Splint Other Brace/Splint: RLE AKA prosthesis Restrictions Weight Bearing Restrictions: No      Mobility  Bed Mobility Overal bed mobility: Modified Independent             General bed mobility comments: Pt does require extra time. Effortful but able to perform without physical assist.  Transfers Overall transfer level: Needs assistance Equipment used: Rolling walker (2 wheeled) Transfers: Sit to/from Stand Sit to Stand: Min assist;From elevated surface         General transfer comment: Min assist for boost to stand x3 from bed. Assist with balance and cues for hand placement and safety while donning/dressing  with prosthesis and clothes.   Ambulation/Gait Ambulation/Gait assistance: Min assist Ambulation Distance (Feet): 15 Feet Assistive device: Rolling walker (2 wheeled) Gait Pattern/deviations: Step-to pattern;Decreased stride length;Shuffle;Trunk flexed Gait velocity: slow Gait velocity interpretation: <1.8 ft/sec, indicative of risk for recurrent falls General Gait Details: Frequent cues for upright posture and walker placement for proximity. Pt required min assist for walker control during approach to recliner. Due to fatigue he had difficulty with foot placement and unlocking prosthesis to flex knee. Heavy reliance on RW for support. Dyspneic 2/4 , SpO2 93% on room air, HR to 60 from 58 at rest.   Stairs            Wheelchair Mobility    Modified Rankin (Stroke Patients Only)       Balance Overall balance assessment: Needs assistance Sitting-balance support: No upper extremity supported;Feet supported Sitting balance-Leahy Scale: Good     Standing balance support: Bilateral upper extremity supported Standing balance-Leahy Scale: Poor                               Pertinent Vitals/Pain Pain Assessment: No/denies pain    Home Living Family/patient expects to be discharged to:: Private residence Living Arrangements: Spouse/significant other Available Help at Discharge: Family;Available 24 hours/day Type of Home: House Home Access: Ramped entrance     Home Layout: Two level;Able to live on main level with bedroom/bathroom Home Equipment: Dan HumphreysWalker - 2 wheels;Shower seat;Wheelchair - power;Bedside commode;Wheelchair - manual Additional Comments: independent with donning and doffing  prosthesis    Prior Function Level of Independence: Independent with assistive device(s)         Comments: states he can ambulate up to 25 feet typically. Does not need assist with bath/dress.     Hand Dominance   Dominant Hand: Right    Extremity/Trunk Assessment    Upper Extremity Assessment: Defer to OT evaluation           Lower Extremity Assessment: Generalized weakness (Hx of Rt AKA)         Communication   Communication: No difficulties  Cognition Arousal/Alertness: Awake/alert Behavior During Therapy: WFL for tasks assessed/performed Overall Cognitive Status: Within Functional Limits for tasks assessed                      General Comments      Exercises        Assessment/Plan    PT Assessment Patient needs continued PT services  PT Diagnosis Difficulty walking;Abnormality of gait;Generalized weakness   PT Problem List Decreased strength;Decreased activity tolerance;Decreased balance;Decreased mobility;Decreased knowledge of use of DME  PT Treatment Interventions DME instruction;Gait training;Functional mobility training;Therapeutic exercise;Therapeutic activities;Balance training;Patient/family education   PT Goals (Current goals can be found in the Care Plan section) Acute Rehab PT Goals Patient Stated Goal: Go home PT Goal Formulation: With patient Time For Goal Achievement: 04/13/15 Potential to Achieve Goals: Good    Frequency Min 3X/week   Barriers to discharge        Co-evaluation               End of Session Equipment Utilized During Treatment: Gait belt Activity Tolerance: Patient limited by fatigue Patient left: in chair;with call bell/phone within reach;with chair alarm set Nurse Communication: Mobility status    Functional Assessment Tool Used: clinical observation Functional Limitation: Mobility: Walking and moving around Mobility: Walking and Moving Around Current Status [phone]): At least 20 percent but less than 40 percent impaired, limited or restricted Mobility: Walking and Moving Around Goal Status [phone]): At least 1 percent but less than 20 percent impaired, limited or restricted    Time: 1225-1300 PT Time Calculation (min) (ACUTE ONLY): 35 min   Charges:   PT  Evaluation $PT Eval Moderate Complexity: 1 Procedure PT Treatments $Therapeutic Activity: 8-22 mins   PT G Codes:   PT G-Codes **NOT FOR INPATIENT CLASS** Functional Assessment Tool Used: clinical observation Functional Limitation: Mobility: Walking and moving around Mobility: Walking and Moving Around Current Status ([id number]): At least 20 percent but less than 40 percent impaired, limited or restricted Mobility: Walking and Moving Around Goal Status [phone]): At least 1 percent but less than 20 percent impaired, limited or restricted    Berton Mount 03/30/2015, 1:29 PM  Sunday [place], [place] [phone]

## 2015-03-30 NOTE — Progress Notes (Signed)
Patient seen and examined. Case d/w residents in detail. I agree with findings and plan as documented in Dr. Marily LenteFord's note.  Patient was admitted with fluid overload likely secondary to worsening renal failure. Lung exam improved today but still with mild bibasilar crackles. LLE edema is slowly improving. He diuresed 1.38 L over the last 24 hours. Will c/w lasix IV 60 mg bid. It is also possible that patient has acute on chronic HF given elevated BNP.   Seen by nephrology- patient does not wish to ever go on HD. 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 Will f/u anti DS DNA, ANA, C3/C4, cryo, GMB Ab, ANCA.  Will monitor BMP.   Patient also noted to have new T wave inversions in anterolateral leads. Cardio f/u appreciated - will f/u TTE and troponins. He is not a candidate for LHC at this time given worsening renal failure.

## 2015-03-30 NOTE — Consult Note (Signed)
Patient ID: [name] [name] MRN: [id number], DOB/AGE: 72-Jun-1947   Admit date: 03/29/2015   Primary Physician: Rocco Serene, MD Primary Cardiologist: Dr. Jens Som  Pt. Profile:  70 y/o male with h/o CAD s/p prior RCA PCI and h/o atrial flutter, mild LV dysfunction with last known EF of 50%, HTN, DM, Stage III CKD, HLD and HCV admitted for acute CHF and acute on chronic kidney disease. Found to have EKG changes with new TWIs in leads V1-V6.   Problem List  Past Medical History  Diagnosis Date  . Hyperlipidemia 04/02/2006  . Essential hypertension 12/09/2006  . Benign prostatic hypertrophy with nocturia 07/07/2006  . Chronic venous insufficiency 04/12/2010  . Obesity (BMI 30.0-34.9) 01/15/2012  . Constipation 05/25/2009    Intermittent   . Microcytic normochromic anemia 05/27/2006  . Internal and external hemorrhoids without complication 08/20/2012  . Coronary artery disease 04/02/2006    s/p RCA DES 2006, low risk myoview in 2011  . 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 Type 2 diabetes mellitus with peripheral artery disease (HCC) 05/27/2006    Absent pulses in the left foot   . Closed displaced fracture of left femoral neck (HCC) 06/14/2014    s/p left hip hemiarthroplasty June 14, 2014   . Atrial flutter (HCC) 07/26/2014    a. May 2016, CHA2DS2VASc = 4 -> Eliquis, spontaneous conversion to NSR;  b. On amio;  c. 01/2015 EPS: Unable to induce right sided Aflutter. Non-sustained Afib and Left sided Aflutter noted.  [name] GERD (gastroesophageal reflux disease)   . Type 2 diabetes mellitus with stage 4 chronic kidney disease (HCC) 08/06/2012  . History of blood transfusion 05/2014    "related to hip OR"  . Degenerative joint disease involving multiple joints  02/02/2007  . Arthritis     "fingers" (02/07/2015)    Past Surgical History  Procedure Laterality Date  . Pilonidal cyst excision  1990's  . Refractive surgery Bilateral   . Fracture surgery    . Total hip arthroplasty Left 06/14/2014    Procedure: HEMI HIP ARTHROPLASTY ANTERIOR APPROACH;  Surgeon: Tarry Kos, MD;  Location: [facility] OR;  Service: Orthopedics;  Laterality: Left;  . Joint replacement    . Leg amputation above knee Right ~ 2008  . Cholecystectomy N/A 12/12/2014    Procedure: LAPAROSCOPIC CHOLECYSTECTOMY WITH INTRAOPERATIVE CHOLANGIOGRAM;  Surgeon: Gaynelle Adu, MD;  Location: [facility] OR;  Service: General;  Laterality: N/A;  . Electrophysiologic study N/A 02/07/2015    Procedure: A-Flutter Ablation;  Surgeon: Marinus Maw, MD;  Location: [facility] INVASIVE CV LAB;  Service: Cardiovascular;  Laterality: N/A;  . Tonsillectomy      "I think so; when I was a little boy"  . Coronary angioplasty with stent placement    . Prostate biopsy  ~ 2013     Allergies  Allergies  Allergen Reactions  . Amoxicillin Swelling and Other (See Comments)    Puffy eyes and abdominal pain with Amoxicillin PO  . Lisinopril Other (See Comments)    Acute kidney injury  . Pravastatin Other (See Comments)    Weakness and fatigue  . Tamsulosin Other (See Comments)    REACTION: dry throat, sweating, blurred vision  . Doxycycline Rash and Other (See Comments)  Questionable drug rxn rash    HPI  70 y/o male, followed by Dr. Jens Somrenshaw, with a h/o CAD s/p RCA PCI + DES in 2006. His most recent Myoview in April 2015 showed EF 61% and normal perfusion. He is s/p right AKA 8 years ago due to complications from poorly controlled DM and wears a prosthesis.  In March 2016, he fell and had a hip fx requiring surgical repair. Then admitted with anemia requiring transfusion. Readmitted 5/16 and found to have atrial flutter. Echo 5/16 showed EF of 50% with basal inferior akinesis, mild LAE, mild RAE/RVE. He was placed on  amiodarone and converted. He was referred to electrophysiology for consideration of atrial flutter ablation to avoid long-term amiodarone and anticoagulation. Ablation was scheduled but patient developed cholecystitis and had cholecystectomy in September. His EP study was rescheduled for 02/07/15 with Dr. Ladona Ridgelaylor. Though he was noted to have both non-sustained AFib and left sided atrial flutter, he did not have any inducible/ablatable right sided atrial flutter on EP study, thus no ablation was performed. His other PMH is significant for stage 4 CKD, HTN, HLD and HCV.  [facility] with a complaint of worsening dyspnea over the past 2 weeks. Also with orthopnea, DOE and LEE. Also with fatigue and generalized weakness. No complaints of chest pain, palpitations, dizziness, syncope/ near syncope. He was taking lasix PRN at home and had to double up but had no improvement in symptoms.  He was examined by IM in the ED and felt to be in acute CHF with volume overload and was admitted for treatment. BNP was >4500. BMP showed acute on chronic kidney disease with SCr up to 4.88 (baseline~3). TSH was high at 18.5 (Free T3/T4 pending). CBC with chronic stable anemia with hgb at 8.6 which is his baseline. CXR showed cardiomegaly with small bilateral pleural effusions, R>L. Per admission notes, his HR was well controlled at time of admit. He was placed on IV lasix and continued on amiodarone, carvedilol and Eliquis for his atrial flutter. EKG was obtained today demonstrating new TWIs in V1-V6, new compared to previous. Chronic RBBB present. HR 52 bpm. Cardiology consulted for new EKG changes. Cardiac enzymes are being cycled x 3. Initial troponin is negative.   He notes some improvement in edema since admission. He continues to sleep with the head of the bed elevated. He is comfortable in this position and currently w/o dyspnea. He continues to deny chest pain.     Home Medications  Prior  to Admission medications   Medication Sig Start Date End Date Taking? Authorizing Provider  amiodarone (PACERONE) 200 MG tablet Take 1 tablet (200 mg total) by mouth daily. 01/19/15  Yes Doneen PoissonLawrence Klima, MD  amLODipine (NORVASC) 10 MG tablet Take 1 tablet (10 mg total) by mouth daily. 03/05/15  Yes Doneen PoissonLawrence Klima, MD  calcium citrate-vitamin D (CITRACAL+D) 315-200 MG-UNIT per tablet Take 1 tablet by mouth daily.    Yes Historical Provider, MD  carvedilol (COREG) 12.5 MG tablet Take 1 tablet (12.5 mg total) by mouth 2 (two) times daily with a meal. 01/19/15  Yes Doneen PoissonLawrence Klima, MD  docusate sodium (COLACE) 100 MG capsule Take 100 mg by mouth at bedtime as needed for moderate constipation (constipation).    Yes Historical Provider, MD  ELIQUIS 5 MG TABS tablet Take 5 mg by mouth 2 (two) times daily. 01/15/15  Yes Historical Provider, MD  famotidine (PEPCID) 20 MG tablet Take 1 tablet (20 mg total)  by mouth 2 (two) times daily. Patient taking differently: Take 20 mg by mouth 2 (two) times daily as needed for heartburn.  07/28/14  Yes Gust Rung, DO  fish oil-omega-3 fatty acids 1000 MG capsule Take 1 g by mouth daily. 01/15/12  Yes Doneen Poisson, MD  fluticasone (FLONASE) 50 MCG/ACT nasal spray Place 1 spray into both nostrils daily as needed for allergies. 01/19/15  Yes Doneen Poisson, MD  furosemide (LASIX) 40 MG tablet Take 1 tablet (40 mg total) by mouth daily. Take 1 tablet by mouth daily as needed for a weight gain of more than 5 lbs in a week. 02/12/15  Yes Marily Memos, MD  hydroxypropyl methylcellulose (ISOPTO TEARS) 2.5 % ophthalmic solution Place 2 drops into both eyes 4 (four) times daily as needed for dry eyes.   Yes Historical Provider, MD  Multiple Vitamins-Minerals (MULTIVITAMIN WITH MINERALS) tablet Take 1 tablet by mouth daily.   Yes Historical Provider, MD  terazosin (HYTRIN) 10 MG capsule Take 1 capsule (10 mg total) by mouth at bedtime. 10/20/14  Yes Doneen Poisson, MD    Family  History  Family History  Problem Relation Age of Onset  . Hypertension Mother   . Heart attack Father   . Breast cancer Sister   . Arthritis Sister   . Heart attack Brother   . Diabetes Brother   . Stroke Brother   . Pneumonia Daughter   . Diabetes Brother   . Alcoholism Brother   . Diabetes Brother   . Arthritis Sister     Bilateral knee replacement  . HIV Daughter   . Hypertension Daughter   . Drug abuse Daughter   . Schizophrenia Daughter   . Hypertension Daughter   . Drug abuse Daughter   . Bipolar disorder Daughter     Social History  Social History   Social History  . Marital Status: Married    Spouse Name: N/A  . Number of Children: N/A  . Years of Education: N/A   Occupational History  . Not on file.   Social History Main Topics  . Smoking status: Former Smoker -- 1.00 packs/day for 10 years    Types: Cigarettes    Quit date: 06/16/1968  . Smokeless tobacco: Never Used  . Alcohol Use: No     Comment: "quit alcohol in the 1960's"  . Drug Use: No  . Sexual Activity: Not Currently    Birth Control/ Protection: None   Other Topics Concern  . Not on file   Social History Narrative     Review of Systems General:  No chills, fever, night sweats or weight changes.  Cardiovascular:  No chest pain, dyspnea on exertion, edema, orthopnea, palpitations, paroxysmal nocturnal dyspnea. Dermatological: No rash, lesions/masses Respiratory: No cough, dyspnea Urologic: No hematuria, dysuria Abdominal:   No nausea, vomiting, diarrhea, bright red blood per rectum, melena, or hematemesis Neurologic:  No visual changes, wkns, changes in mental status. All other systems reviewed and are otherwise negative except as noted above.  Physical Exam  Blood pressure 142/65, pulse 56, temperature 98.4 F (36.9 C), temperature source Oral, resp. rate 18, height 5\' 8"  (1.727 m), weight 176 lb 5.9 oz (80 kg), SpO2 97 %.  General: Pleasant, NAD Psych: Normal affect. Neuro:  Alert and oriented X 3. Moves all extremities spontaneously. HEENT: Normal  Neck: Supple without bruits or JVD. Lungs:  Resp regular and unlabored, CTA. Heart: RRR no s3, s4, or murmurs. Abdomen: Soft, non-tender, non-distended, BS + x 4.  Extremities: No clubbing, cyanosis or edema. DP/PT/Radials 2+ and equal bilaterally.  Labs  Troponin (Point of Care Test) No results for input(s): TROPIPOC in the last 72 hours.  Recent Labs  03/30/15 0757  TROPONINI <0.03   Lab Results  Component Value Date   WBC 5.1 03/30/2015   HGB 8.5* 03/30/2015   HCT 26.7* 03/30/2015   MCV 70.3* 03/30/2015   PLT 115* 03/30/2015    Recent Labs Lab 03/30/15 0555  NA 144  K 4.5  CL 116*  CO2 19*  BUN 109*  CREATININE 4.84*  CALCIUM 8.7*  GLUCOSE 93   Lab Results  Component Value Date   CHOL 132 12/11/2014   HDL 23* 12/11/2014   LDLCALC 72 12/11/2014   TRIG 187* 12/11/2014   Lab Results  Component Value Date   DDIMER 0.65* 07/25/2014     Radiology/Studies  X-ray Chest Pa And Lateral  03/29/2015  CLINICAL DATA:  CHF exacerbation. EXAM: CHEST  2 VIEW COMPARISON:  02/12/2015 FINDINGS: Lordotic technique is demonstrated. Lungs are hypoinflated and demonstrate small bilateral pleural effusions right greater than left without significant change. Likely associated bibasilar atelectasis. Stable moderate cardiomegaly. Improved perihilar prominence likely mild residual vascular congestion. Remainder of the exam is unchanged. IMPRESSION: Stable cardiomegaly with improved mild vascular congestion. Small bilateral pleural effusions right greater left without significant change. Electronically Signed   By: Elberta Fortis M.D.   On: 03/29/2015 17:14    ECG  Sinus bradycardia, HR 52 bpm. Chronic RBBB. Left axis deviation. New TWIs in leads V1-V6.     ASSESSMENT AND PLAN  Active Problems:   Essential hypertension   Type 2 diabetes mellitus with stage 4 chronic kidney disease (HCC)   CKD (chronic  kidney disease) stage 4, GFR 15-29 ml/min (HCC)   Acute on chronic kidney failure (HCC)   CHF exacerbation (HCC)    1. Acute on Chronic CHF: Patient continues to be volume overloaded with 1-2+ left sided LEE. He has had a good diuretic response thus far and he notes subjective improvement. I/Os show good UOP,  With -1.5L out yesterday. Recommend continuation of lasix, however given his rise in SCr, now in the 4 range, he will need nephrology consultation. He will need a repeat echo to assess for change in systolic function. Continue Coreg. No ACE-ARB. If echo shows drop in EF, would recommend initiation of nitrate + hydralazine, in place of an ACE/ARB for afterload reduction, if BP allows. Given his EKG changes and known h/o CAD, coronary ischemia may be a potential cause of his HF. However, given his renal function, he is not a candidate for LHC at this time. With a TSH level of 18, hypothyroidism may also be a potential etiology. Free T3/T4 pending. Continue strict I/Os and low sodium diet.   2. Abnormal EKG: EKG shows new TWIs in leads V1-V6. He denies CP but presents with acute CHF exacerbation. He has known coronary disease, s/p PCI + DES to the RCA in 2006. Recommend reassessment of LV systolic function with echo. We will also check for wall motion abnormalities that may suggest ischemia. Until nephrology consult to see if he is a candidate for HD, he is not a candidate for LHC at this time. We will continue to treat medically as long as he is CP free. Continue BB and statin for CAD. He is not on ASA given h/o anemia and need for Eliquis for PAF.   3. H/o Atrial Flutter: NSR/ sinus brady on telemetry with rate  in the 50s. He is not a candidate for flutter ablation as recent EP study showed no inducible/ablatable right sided atrial flutter. Continue rhythm control strategy with amiodarone. Continue Eliquis for a/c. His Hgb is at his baseline and stable.    4. Acute on Chronic Kidney Disease: per  primary and nephrology.   5. HTN: controlled on Coreg. If he becomes hypertensive, can consider adding nitrate + hydralazine. No room to increase Coreg given resting bradycardia.   6. Abnormal Thyroid Function Test: TSH abnormal at 18. Free T3/T4 Pending. Primary team to follow and manage.   7. DM: per primary.   8. HLD: continue statin. Last lipid panel 12/11/14 showed LDL to be controlled at 72 mg/dL.       Signed, Robbie Lis, PA-C 03/30/2015, 9:57 AM   I have examined the patient and reviewed assessment and plan and discussed with patient.  Agree with above as stated.  Initial sx was orthopnea which has improved.   Check echo.  WOuld not pursue cath as he is at high risk of contrast induced nephropathy and he is not agreeable to dialysis at this time. Would only consider cath if sx change and there was evidence of ACS.   Abnormal ECG noted.  D/w Dr. Eliott Nine.  Lance Muss S.

## 2015-03-30 NOTE — Progress Notes (Addendum)
Subjective:  Patient had no acute complaints this morning. He said that he was breathing much better and that he made a lot of urine. He said the swelling was down in his abdomen. He denies any chest pain this morning.   [name] was seen by nephrology today, and he reports that he absolutely will not undergo dialysis.   Objective: Vital signs in last 24 hours: Filed Vitals:   03/29/15 2201 03/30/15 0530 03/30/15 0832 03/30/15 0839  BP: 112/67 105/59  142/65  Pulse: 55 57 53 56  Temp: 97.6 F (36.4 C) 98.3 F (36.8 C)  98.4 F (36.9 C)  TempSrc: Oral Oral  Oral  Resp: 18 18  18   Height:      Weight:  176 lb 5.9 oz (80 kg)    SpO2: 97% 99%  97%   Weight change:   Intake/Output Summary (Last 24 hours) at 03/30/15 1328 Last data filed at 03/30/15 1200  Gross per 24 hour  Intake    180 ml  Output   2275 ml  Net  -2095 ml   Physical Exam: General: Lying in bed, NAD HEENT: No scleral icterus, no tonsillar erythema or exudate, Moist Mucous Membranes. +JVD Cardiovascular: RRR. No friction rub. No S3 Pulmonary: Fine crackles in the lung bases. Abdominal: Soft. NT/ND. Normal bowel sounds Extremities: left lower extremity pitting edema. Right leg BKA Neurological: AAOx3 Psychiatric: Normal behavior and affect  Lab Results: Basic Metabolic Panel:  Recent Labs Lab 03/29/15 1042 03/30/15 0555  NA 142 144  K 4.6 4.5  CL 115* 116*  CO2 19* 19*  GLUCOSE 106* 93  BUN 112* 109*  CREATININE 4.88* 4.84*  CALCIUM 8.5* 8.7*  PHOS  --  5.3*   Liver Function Tests:  Recent Labs Lab 03/30/15 0555  ALBUMIN 2.8*   CBC:  Recent Labs Lab 03/30/15 0555  WBC 5.1  HGB 8.5*  HCT 26.7*  MCV 70.3*  PLT 115*   Cardiac Enzymes:  Recent Labs Lab 03/30/15 0757  TROPONINI <0.03   Thyroid Function Tests:  Recent Labs Lab 03/30/15 0555  TSH 18.534*    Micro Results: No results found for this or any previous visit (from the past 240  hour(s)). Studies/Results: X-ray Chest Pa And Lateral  03/29/2015  CLINICAL DATA:  CHF exacerbation. EXAM: CHEST  2 VIEW COMPARISON:  02/12/2015 FINDINGS: Lordotic technique is demonstrated. Lungs are hypoinflated and demonstrate small bilateral pleural effusions right greater than left without significant change. Likely associated bibasilar atelectasis. Stable moderate cardiomegaly. Improved perihilar prominence likely mild residual vascular congestion. Remainder of the exam is unchanged. IMPRESSION: Stable cardiomegaly with improved mild vascular congestion. Small bilateral pleural effusions right greater left without significant change. Electronically Signed   By: Elberta Fortisaniel  Boyle M.D.   On: 03/29/2015 17:14   Medications: I have reviewed the patient's current medications. Scheduled Meds: . amiodarone  200 mg Oral Daily  . apixaban  5 mg Oral BID  . calcium-vitamin D  1 tablet Oral Q breakfast  . carvedilol  12.5 mg Oral BID WC  . famotidine  20 mg Oral BID  . furosemide  60 mg Intravenous BID  . multivitamin with minerals  1 tablet Oral Daily  . omega-3 acid ethyl esters  1 g Oral Daily  . pravastatin  40 mg Oral QPM  . terazosin  10 mg Oral QHS   Continuous Infusions:  PRN Meds:.sodium chloride, docusate sodium, fluticasone, ondansetron Assessment/Plan:  Volume Overload secondary to Acute versus Worsening CKD Stage  4: In light of his worsening renal function over the past 6 months, nephrology was consulted for further evaluation. Regrettably, Mr. Outten has said without reservation that he would not undergo hemodialysis, saying that his father had gone through it. Nephrology recommends investigating reversible causes, included MPGN in the setting of his known untreated Hep C infection - possibly considering a renal biopsy. He has had -1.5L net out on diuresis. Creatinine high but stable.  - Anti-DS-DNA, ANA, C3/C4, Cryo, GMB Ab, ANCA, Protein/Cr pending - Furosemide 60 mg IV BID -  Nephrology has evaluated, greatly appreciate recommendations - RFP in AM - Daily Weights - Strict I/O's  Acute on Chronic HF (EF 50%): BNP elevated, but could be due to worsening renal function. Could be contributor to volume overload. Ischemia or perhaps hypothyroidism (TSH 18) could be leading to worsening ejecting fraction as well. Cardiology is following and recommends a repeat TTE, and if there is a reduced EF from prior, they recommend nitrate + hydralazine, in place of an ACE/ARB for afterload reduction, if BP allows. - Free T4 pending. - TTE pending - Furosemide per above  CAD (RCA DES in 2006): T-wave inversions noted in V1-V6 on EKG. Currently cycling troponins. He has been seen by Cardiology, and he is not a candidate for LHC at this time (contrast-induced nephropathy). They would only consider a cath if he had chest pain symptoms consistent with ACS. Medical management at time.  - Pravastatin 40 mg daily - Carvedilol 12.5 mg BID  Anemia: Hgb consistently in the 8's. Checking iron studies. Nephrology considering Aranesp.  Atrial Flutter: Unsuccessful ablation 01/2015. He is not a candidate for flutter ablation as recent EP study showed no inducible/ablatable right sided atrial flutter. - Amiodarone 200 mg daily - Eliquis 5 mg daily - Carvedilol 12.5 mg BID  CAD: RCA DES in 2006 - Pravastatin 40 mg daily - Carvedilol 12.5 mg BID  BPH: Terazosin 10 mg daily  DVT Prophylaxis: Eliquis  Dispo: Disposition is deferred at this time, awaiting improvement of current medical problems.  Anticipated discharge in approximately 4-5 day(s).   The patient does have a current PCP Doneen Poisson, MD) and does need an [facility] hospital follow-up appointment after discharge.  The patient does not have transportation limitations that hinder transportation to clinic appointments.  .Services Needed at time of discharge: Y = Yes, Blank = No PT:   OT:   RN:   Equipment:   Other:     LOS: 1  day   Ruben Im, MD 03/30/2015, 1:28 PM

## 2015-03-31 ENCOUNTER — Observation Stay (HOSPITAL_BASED_OUTPATIENT_CLINIC_OR_DEPARTMENT_OTHER): Payer: Medicare Other

## 2015-03-31 DIAGNOSIS — E1149 Type 2 diabetes mellitus with other diabetic neurological complication: Secondary | ICD-10-CM | POA: Diagnosis present

## 2015-03-31 DIAGNOSIS — N184 Chronic kidney disease, stage 4 (severe): Secondary | ICD-10-CM | POA: Diagnosis not present

## 2015-03-31 DIAGNOSIS — I10 Essential (primary) hypertension: Secondary | ICD-10-CM | POA: Diagnosis not present

## 2015-03-31 DIAGNOSIS — Z955 Presence of coronary angioplasty implant and graft: Secondary | ICD-10-CM | POA: Diagnosis not present

## 2015-03-31 DIAGNOSIS — E039 Hypothyroidism, unspecified: Secondary | ICD-10-CM | POA: Diagnosis present

## 2015-03-31 DIAGNOSIS — I5023 Acute on chronic systolic (congestive) heart failure: Secondary | ICD-10-CM | POA: Diagnosis present

## 2015-03-31 DIAGNOSIS — N401 Enlarged prostate with lower urinary tract symptoms: Secondary | ICD-10-CM

## 2015-03-31 DIAGNOSIS — I132 Hypertensive heart and chronic kidney disease with heart failure and with stage 5 chronic kidney disease, or end stage renal disease: Secondary | ICD-10-CM | POA: Diagnosis present

## 2015-03-31 DIAGNOSIS — I272 Other secondary pulmonary hypertension: Secondary | ICD-10-CM | POA: Diagnosis present

## 2015-03-31 DIAGNOSIS — I2510 Atherosclerotic heart disease of native coronary artery without angina pectoris: Secondary | ICD-10-CM

## 2015-03-31 DIAGNOSIS — E8770 Fluid overload, unspecified: Secondary | ICD-10-CM | POA: Diagnosis not present

## 2015-03-31 DIAGNOSIS — E785 Hyperlipidemia, unspecified: Secondary | ICD-10-CM | POA: Diagnosis present

## 2015-03-31 DIAGNOSIS — I872 Venous insufficiency (chronic) (peripheral): Secondary | ICD-10-CM | POA: Diagnosis present

## 2015-03-31 DIAGNOSIS — D631 Anemia in chronic kidney disease: Secondary | ICD-10-CM | POA: Diagnosis present

## 2015-03-31 DIAGNOSIS — Z87891 Personal history of nicotine dependence: Secondary | ICD-10-CM | POA: Diagnosis not present

## 2015-03-31 DIAGNOSIS — I739 Peripheral vascular disease, unspecified: Secondary | ICD-10-CM | POA: Diagnosis present

## 2015-03-31 DIAGNOSIS — R351 Nocturia: Secondary | ICD-10-CM | POA: Diagnosis not present

## 2015-03-31 DIAGNOSIS — I5033 Acute on chronic diastolic (congestive) heart failure: Secondary | ICD-10-CM

## 2015-03-31 DIAGNOSIS — Z96642 Presence of left artificial hip joint: Secondary | ICD-10-CM | POA: Diagnosis present

## 2015-03-31 DIAGNOSIS — I255 Ischemic cardiomyopathy: Secondary | ICD-10-CM | POA: Diagnosis present

## 2015-03-31 DIAGNOSIS — N179 Acute kidney failure, unspecified: Secondary | ICD-10-CM | POA: Diagnosis present

## 2015-03-31 DIAGNOSIS — D891 Cryoglobulinemia: Secondary | ICD-10-CM | POA: Diagnosis present

## 2015-03-31 DIAGNOSIS — E1139 Type 2 diabetes mellitus with other diabetic ophthalmic complication: Secondary | ICD-10-CM | POA: Diagnosis present

## 2015-03-31 DIAGNOSIS — I4892 Unspecified atrial flutter: Secondary | ICD-10-CM | POA: Diagnosis present

## 2015-03-31 DIAGNOSIS — R0602 Shortness of breath: Secondary | ICD-10-CM | POA: Diagnosis present

## 2015-03-31 DIAGNOSIS — Z89511 Acquired absence of right leg below knee: Secondary | ICD-10-CM | POA: Diagnosis not present

## 2015-03-31 DIAGNOSIS — I509 Heart failure, unspecified: Secondary | ICD-10-CM | POA: Diagnosis not present

## 2015-03-31 DIAGNOSIS — E1122 Type 2 diabetes mellitus with diabetic chronic kidney disease: Secondary | ICD-10-CM | POA: Diagnosis present

## 2015-03-31 DIAGNOSIS — K219 Gastro-esophageal reflux disease without esophagitis: Secondary | ICD-10-CM | POA: Diagnosis present

## 2015-03-31 DIAGNOSIS — N185 Chronic kidney disease, stage 5: Secondary | ICD-10-CM | POA: Diagnosis present

## 2015-03-31 DIAGNOSIS — B1920 Unspecified viral hepatitis C without hepatic coma: Secondary | ICD-10-CM | POA: Diagnosis present

## 2015-03-31 DIAGNOSIS — R3129 Other microscopic hematuria: Secondary | ICD-10-CM | POA: Diagnosis present

## 2015-03-31 LAB — IRON AND TIBC
Iron: 26 ug/dL — ABNORMAL LOW (ref 45–182)
SATURATION RATIOS: 8 % — AB (ref 17.9–39.5)
TIBC: 340 ug/dL (ref 250–450)
UIBC: 314 ug/dL

## 2015-03-31 LAB — URINALYSIS, ROUTINE W REFLEX MICROSCOPIC
Bilirubin Urine: NEGATIVE
Glucose, UA: NEGATIVE mg/dL
Hgb urine dipstick: NEGATIVE
Ketones, ur: NEGATIVE mg/dL
LEUKOCYTES UA: NEGATIVE
Nitrite: NEGATIVE
Protein, ur: NEGATIVE mg/dL
SPECIFIC GRAVITY, URINE: 1.01 (ref 1.005–1.030)
pH: 5 (ref 5.0–8.0)

## 2015-03-31 LAB — CBC
HCT: 27.4 % — ABNORMAL LOW (ref 39.0–52.0)
Hemoglobin: 8.7 g/dL — ABNORMAL LOW (ref 13.0–17.0)
MCH: 22.3 pg — ABNORMAL LOW (ref 26.0–34.0)
MCHC: 31.8 g/dL (ref 30.0–36.0)
MCV: 70.3 fL — ABNORMAL LOW (ref 78.0–100.0)
PLATELETS: 148 10*3/uL — AB (ref 150–400)
RBC: 3.9 MIL/uL — ABNORMAL LOW (ref 4.22–5.81)
RDW: 16.2 % — AB (ref 11.5–15.5)
WBC: 4.9 10*3/uL (ref 4.0–10.5)

## 2015-03-31 LAB — RENAL FUNCTION PANEL
ALBUMIN: 2.8 g/dL — AB (ref 3.5–5.0)
Anion gap: 10 (ref 5–15)
BUN: 110 mg/dL — AB (ref 6–20)
CALCIUM: 8.6 mg/dL — AB (ref 8.9–10.3)
CHLORIDE: 112 mmol/L — AB (ref 101–111)
CO2: 21 mmol/L — ABNORMAL LOW (ref 22–32)
CREATININE: 5.05 mg/dL — AB (ref 0.61–1.24)
GFR calc Af Amer: 12 mL/min — ABNORMAL LOW (ref >60)
GFR, EST NON AFRICAN AMERICAN: 10 mL/min — AB (ref >60)
Glucose, Bld: 107 mg/dL — ABNORMAL HIGH (ref 65–99)
PHOSPHORUS: 5.3 mg/dL — AB (ref 2.5–4.6)
Potassium: 4.7 mmol/L (ref 3.5–5.1)
SODIUM: 143 mmol/L (ref 135–145)

## 2015-03-31 LAB — PROTEIN / CREATININE RATIO, URINE
Creatinine, Urine: 35.8 mg/dL
Protein Creatinine Ratio: 0.39 mg/mg{Cre} — ABNORMAL HIGH (ref 0.00–0.15)
Total Protein, Urine: 14 mg/dL

## 2015-03-31 LAB — FERRITIN: FERRITIN: 89 ng/mL (ref 24–336)

## 2015-03-31 MED ORDER — DARBEPOETIN ALFA 100 MCG/0.5ML IJ SOSY
100.0000 ug | PREFILLED_SYRINGE | INTRAMUSCULAR | Status: DC
  Administered 2015-04-01: 100 ug via SUBCUTANEOUS
  Filled 2015-03-31 (×3): qty 0.5

## 2015-03-31 MED ORDER — PROMETHAZINE HCL 25 MG PO TABS: 12.5000 mg | ORAL_TABLET | Freq: Four times a day (QID) | ORAL | Status: DC | PRN

## 2015-03-31 MED ORDER — SODIUM CHLORIDE 0.9 % IV SOLN
510.0000 mg | Freq: Once | INTRAVENOUS | Status: AC
  Administered 2015-03-31: 510 mg via INTRAVENOUS
  Filled 2015-03-31: qty 17

## 2015-03-31 MED ORDER — PROMETHAZINE HCL 25 MG/ML IJ SOLN
12.5000 mg | Freq: Four times a day (QID) | INTRAMUSCULAR | Status: DC | PRN
Start: 2015-03-31 — End: 2015-04-02

## 2015-03-31 NOTE — Evaluation (Signed)
Occupational Therapy Evaluation and Discharge Patient Details Name: [name] [name] MRN: [id number] DOB: June 20, 1945 Today's Date: 03/31/2015    History of Present Illness 70 y/o male with PMH of CKD stage 4, chronic systolic CHF (EF 40%), aflutter, HTN, DM, CAD s/p RCA DES, HCV who p/w worsening SOB over the past 2 weeks. Patient complains of orthopnea, PND, DOE and worsening L LE edema.   Clinical Impression   Pt was assisted to don his sock, but otherwise performing ADL and ADL transfers with his prosthesis at a modified independent level.  Pt has very specific techniques he uses at home for LB dressing and toileting.  He declined demonstration, reporting that his performance would not be typical in this environment.  Educated pt on importance of OOB activity and wearing his prosthesis daily. Issued and instructed in use of sock aide.  Pt and wife with no further concerns related to ADL.      Follow Up Recommendations  No OT follow up    Equipment Recommendations  None recommended by OT    Recommendations for Other Services       Precautions / Restrictions Precautions Precautions: Fall Required Braces or Orthoses: Other Brace/Splint Other Brace/Splint: R LE prosthesis Restrictions Weight Bearing Restrictions: No      Mobility Bed Mobility Overal bed mobility: Modified Independent             General bed mobility comments: use of rail  Transfers                      Balance     Sitting balance-Leahy Scale: Good                                      ADL Overall ADL's : Needs assistance/impaired Eating/Feeding: Independent;Sitting   Grooming: Wash/dry hands;Wash/dry face;Sitting;Set up   Upper Body Bathing: Set up;Sitting   Lower Body Bathing: Minimal assistance;Sit to/from stand   Upper Body Dressing : Set up;Sitting   Lower Body Dressing: Minimal assistance;Sit to/from stand Lower Body Dressing Details (indicate cue type and  reason): issued pt sock aide and practiced use               General ADL Comments: Pt has very specific technique for donning his R LE prosthesis.  Declined demonstration in the absence of his home environment,  Pt with fear of falling on what he views as a slick floor in the hospital.     Vision     Perception     Praxis      Pertinent Vitals/Pain Pain Assessment: No/denies pain     Hand Dominance Right   Extremity/Trunk Assessment Upper Extremity Assessment Upper Extremity Assessment: Overall WFL for tasks assessed   Lower Extremity Assessment Lower Extremity Assessment: Defer to PT evaluation       Communication Communication Communication: No difficulties   Cognition Arousal/Alertness: Awake/alert Behavior During Therapy: WFL for tasks assessed/performed Overall Cognitive Status: Within Functional Limits for tasks assessed                     General Comments       Exercises       Shoulder Instructions      Home Living Family/patient expects to be discharged to:: Private residence Living Arrangements: Spouse/significant other Available Help at Discharge: Family;Available 24 hours/day Type of Home: House Home Access: Ramped entrance  Home Layout: Two level;Able to live on main level with bedroom/bathroom     Bathroom Shower/Tub: Tub/shower unit Shower/tub characteristics: Engineer, building servicesCurtain Bathroom Toilet: Standard Bathroom Accessibility: Yes How Accessible: Accessible via wheelchair Home Equipment: Walker - 2 wheels;Wheelchair - power;Bedside commode;Wheelchair - manual;Tub bench   Additional Comments: independent with donning and doffing prosthesis      Prior Functioning/Environment Level of Independence: Needs assistance        Comments: states he can ambulate up to 25 feet typically. Assisted for sock.    OT Diagnosis: Generalized weakness   OT Problem List:     OT Treatment/Interventions:      OT Goals(Current goals can be  found in the care plan section) Acute Rehab OT Goals Patient Stated Goal: Go home  OT Frequency:     Barriers to D/C:            Co-evaluation              End of Session    Activity Tolerance: Patient tolerated treatment well Patient left: in bed;with call bell/phone within reach;with family/visitor present   Time: [id number] OT Time Calculation (min): 34 min Charges:  OT General Charges $OT Visit: 1 Procedure OT Evaluation $OT Eval Moderate Complexity: 1 Procedure OT Treatments $Self Care/Home Management : 8-22 mins G-Codes: OT G-codes **NOT FOR INPATIENT CLASS** Functional Assessment Tool Used: clinical judgement Functional Limitation: Self care Self Care Current Status ([id number]): At least 1 percent but less than 20 percent impaired, limited or restricted Self Care Goal Status ([id number]): At least 1 percent but less than 20 percent impaired, limited or restricted Self Care Discharge Status [phone]([id number]): At least 1 percent but less than 20 percent impaired, limited or restricted  Evern BioMayberry, Jonel Sick Lynn 03/31/2015, 1:56 PM  [phone]

## 2015-03-31 NOTE — Progress Notes (Signed)
SUBJECTIVE:  No complaints  OBJECTIVE:   Vitals:   Filed Vitals:   03/30/15 1547 03/30/15 2019 03/30/15 2112 03/31/15 0510  BP: 130/64 134/77 138/63 102/58  Pulse: 56 87 55 51  Temp: 98.1 F (36.7 C) 98.4 F (36.9 C) 97.4 F (36.3 C) 98.5 F (36.9 C)  TempSrc: Oral Oral Oral Oral  Resp: 18 19 20 19   Height:  5\' 8"  (1.727 m)    Weight:  177 lb 7.5 oz (80.5 kg) 176 lb 12.9 oz (80.2 kg)   SpO2: 97% 100% 95% 90%   I&O's:   Intake/Output Summary (Last 24 hours) at 03/31/15 0917 Last data filed at 03/31/15 0616  Gross per 24 hour  Intake    480 ml  Output   1325 ml  Net   -845 ml   TELEMETRY: Reviewed telemetry pt in NSR:  PHYSICAL EXAM General: Well developed, well nourished, in no acute distress Head: Eyes PERRLA, No xanthomas.   Normal cephalic and atramatic  Lungs:   Clear bilaterally to auscultation and percussion. Heart:   HRRR S1 S2 Pulses are 2+ & equal. Abdomen: Bowel sounds are positive, abdomen soft and non-tender without masses Extremities:   No clubbing, cyanosis DP +1.  2+ pedal edema Neuro: Alert and oriented X 3. Psych:  Good affect, responds appropriately   LABS: Basic Metabolic Panel:  Recent Labs  16/10/96 0555 03/31/15 0538  NA 144 143  K 4.5 4.7  CL 116* 112*  CO2 19* 21*  GLUCOSE 93 107*  BUN 109* 110*  CREATININE 4.84* 5.05*  CALCIUM 8.7* 8.6*  PHOS 5.3* 5.3*   Liver Function Tests:  Recent Labs  03/30/15 0555 03/31/15 0538  ALBUMIN 2.8* 2.8*   No results for input(s): LIPASE, AMYLASE in the last 72 hours. CBC:  Recent Labs  03/30/15 0555 03/31/15 0538  WBC 5.1 4.9  HGB 8.5* 8.7*  HCT 26.7* 27.4*  MCV 70.3* 70.3*  PLT 115* 148*   Cardiac Enzymes:  Recent Labs  03/30/15 0757 03/30/15 1510 03/30/15 1910  TROPONINI <0.03 <0.03 0.03   BNP: Invalid input(s): POCBNP D-Dimer: No results for input(s): DDIMER in the last 72 hours. Hemoglobin A1C: No results for input(s): HGBA1C in the last 72 hours. Fasting Lipid  Panel: No results for input(s): CHOL, HDL, LDLCALC, TRIG, CHOLHDL, LDLDIRECT in the last 72 hours. Thyroid Function Tests:  Recent Labs  03/30/15 0555  TSH 18.534*   Anemia Panel: No results for input(s): VITAMINB12, FOLATE, FERRITIN, TIBC, IRON, RETICCTPCT in the last 72 hours. Coag Panel:   Lab Results  Component Value Date   INR 1.39 12/12/2014   INR 1.67* 12/11/2014   INR 1.21 07/25/2014    RADIOLOGY: X-ray Chest Pa And Lateral  03/29/2015  CLINICAL DATA:  CHF exacerbation. EXAM: CHEST  2 VIEW COMPARISON:  02/12/2015 FINDINGS: Lordotic technique is demonstrated. Lungs are hypoinflated and demonstrate small bilateral pleural effusions right greater than left without significant change. Likely associated bibasilar atelectasis. Stable moderate cardiomegaly. Improved perihilar prominence likely mild residual vascular congestion. Remainder of the exam is unchanged. IMPRESSION: Stable cardiomegaly with improved mild vascular congestion. Small bilateral pleural effusions right greater left without significant change. Electronically Signed   By: Elberta Fortis M.D.   On: 03/29/2015 17:14   US Renal  03/30/2015  CLINICAL DATA:  Chronic kidney disease. EXAM: RENAL / URINARY TRACT ULTRASOUND COMPLETE COMPARISON:  CT, 12/10/2014 FINDINGS: Right Kidney: Length: 10.0 cm. Increased parenchymal echogenicity. Several cysts. There is a 12 mm upper pole  cyst with adjacent 14 mm cyst. There is a 2.2 cm lateral cyst. No solid masses or stones. No hydronephrosis. Left Kidney: Length: 11.2 cm. Normal overall renal parenchymal echogenicity. 3.4 cm cyst arises from the upper pole. Hypoechoic anechoic the mm lesion lies in the midpole, not well-defined but likely an additional cyst. No other masses. No stones. No hydronephrosis. Bladder: Prostate gland indents the bladder base. Prostate measures 4.7 x 5.9 x 6.0 cm. No bladder mass. No convincing wall thickening. There are bilateral pleural effusions. Ascites small  amount of free fluid is seen adjacent to the bladder in the pelvis. IMPRESSION: 1. No hydronephrosis. 2. Increased parenchymal echogenicity on the right suggests medical renal disease. 3. Bilateral renal cysts. One presumed 8 mm cyst in the left kidney at the midpole is not fully characterized on this exam. Recommend followup ultrasound 4-6 months. 4. Bladder wall thickening noted on the prior CT is not evident on ultrasound. 5. Enlarged prostate gland. 6. Pleural effusions with a small amount of ascites collecting in the posterior pelvis. Electronically Signed   By: Amie Portlandavid  Ormond M.D.   On: 03/30/2015 19:36    ASSESSMENT AND PLAN  Active Problems:  Essential hypertension  Type 2 diabetes mellitus with stage 4 chronic kidney disease (HCC)  CKD (chronic kidney disease) stage 4, GFR 15-29 ml/min (HCC)  Acute on chronic kidney failure (HCC)  CHF exacerbation (HCC)    1. Acute on Chronic CHF: Patient continues to be volume overloaded with 1-2+ left sided LEE. He has had a good diuretic response thus far and he notes subjective improvement. I/Os show good UOP, With -1.3L out yesterday and net neg 2.2L. Recommend continuation of and appreciate nephrology consultation. He will need a repeat echo to assess for change in systolic function. Continue Coreg. No ACE-ARB. If echo shows drop in EF, would recommend initiation of nitrate + hydralazine, in place of an ACE/ARB for afterload reduction, if BP allows. Given his EKG changes and known h/o CAD, coronary ischemia may be a potential cause of his HF. However, given his renal function, he is not a candidate for LHC at this time. With a TSH level of 18, hypothyroidism may also be a potential etiology. Free T3 pending and free T4 pending. Continue strict I/Os and low sodium diet.   2. Abnormal EKG: EKG shows new TWIs in leads V1-V6. He denies CP but presents with acute CHF exacerbation. He has known coronary disease, s/p PCI + DES to the RCA in 2006.  Recommend reassessment of LV systolic function with echo. We will also check for wall motion abnormalities that may suggest ischemia. He is not a candidate for LHC at this time. We will continue to treat medically as long as he is CP free. Continue BB and statin for CAD. He is not on ASA given h/o anemia and need for Eliquis for PAF.   3. H/o Atrial Flutter: NSR/ sinus brady on telemetry with rate in the 50s. He is not a candidate for flutter ablation as recent EP study showed no inducible/ablatable right sided atrial flutter. Continue rhythm control strategy with amiodarone. Continue Eliquis for a/c. His Hgb is at his baseline and stable.   4. Acute on Chronic Kidney Disease: per primary and nephrology.   5. HTN: controlled on Coreg. If he becomes hypertensive, can consider adding nitrate + hydralazine. No room to increase Coreg given resting bradycardia.   6. Abnormal Thyroid Function Test: TSH abnormal at 18. Free T3 Pending. Primary team to follow and  manage.   7. DM: per primary.   8. HLD: continue statin. Last lipid panel 12/11/14 showed LDL to be controlled at 72 mg/dL.     Quintella Reichert, MD  03/31/2015  9:17 AM

## 2015-03-31 NOTE — Progress Notes (Signed)
Subjective:  Pt had nausea overnight and vomited his coke. He was given phenegan overnight but still felt nauseous this morning. He does not take pravastatin at home even though it is on his med list due to nausea which was given to him yesterday.   Again states he does not want HD. He use to take his father to HD and feels that he does not want to leave his remaining years on HD.  Objective: Vital signs in last 24 hours: Filed Vitals:   03/30/15 1547 03/30/15 2019 03/30/15 2112 03/31/15 0510  BP: 130/64 134/77 138/63 102/58  Pulse: 56 87 55 51  Temp: 98.1 F (36.7 C) 98.4 F (36.9 C) 97.4 F (36.3 C) 98.5 F (36.9 C)  TempSrc: Oral Oral Oral Oral  Resp: 18 19 20 19   Height:  5\' 8"  (1.727 m)    Weight:  177 lb 7.5 oz (80.5 kg) 176 lb 12.9 oz (80.2 kg)   SpO2: 97% 100% 95% 90%   Weight change: -15 lb 14 oz (-7.2 kg)  Intake/Output Summary (Last 24 hours) at 03/31/15 0854 Last data filed at 03/31/15 0616  Gross per 24 hour  Intake    480 ml  Output   1325 ml  Net   -845 ml   Physical Exam: General: sitting up in bed eating breakfast HEENT: No scleral icterus, no tonsillar erythema or exudate, Moist Mucous Membranes. +JVD Cardiovascular: RRR. No friction rub. No S3 Pulmonary: bibasilar crackles and LML crackles Abdominal: Soft. NT/ND. Normal bowel sounds Extremities: 1+ left lower extremity pitting edema. Right leg BKA Neurological: AAOx3 Psychiatric: Normal behavior and affect  Lab Results: Basic Metabolic Panel:  Recent Labs Lab 03/30/15 0555 03/31/15 0538  NA 144 143  K 4.5 4.7  CL 116* 112*  CO2 19* 21*  GLUCOSE 93 107*  BUN 109* 110*  CREATININE 4.84* 5.05*  CALCIUM 8.7* 8.6*  PHOS 5.3* 5.3*   Liver Function Tests:  Recent Labs Lab 03/30/15 0555 03/31/15 0538  ALBUMIN 2.8* 2.8*   CBC:  Recent Labs Lab 03/30/15 0555 03/31/15 0538  WBC 5.1 4.9  HGB 8.5* 8.7*  HCT 26.7* 27.4*  MCV 70.3* 70.3*  PLT 115* 148*   Cardiac Enzymes:  Recent  Labs Lab 03/30/15 0757 03/30/15 1510 03/30/15 1910  TROPONINI <0.03 <0.03 0.03   Thyroid Function Tests:  Recent Labs Lab 03/30/15 0555 03/30/15 1510  TSH 18.534*  --   FREET4  --  0.70    Studies/Results: X-ray Chest Pa And Lateral  03/29/2015  CLINICAL DATA:  CHF exacerbation. EXAM: CHEST  2 VIEW COMPARISON:  02/12/2015 FINDINGS: Lordotic technique is demonstrated. Lungs are hypoinflated and demonstrate small bilateral pleural effusions right greater than left without significant change. Likely associated bibasilar atelectasis. Stable moderate cardiomegaly. Improved perihilar prominence likely mild residual vascular congestion. Remainder of the exam is unchanged. IMPRESSION: Stable cardiomegaly with improved mild vascular congestion. Small bilateral pleural effusions right greater left without significant change. Electronically Signed   By: Elberta Fortis M.D.   On: 03/29/2015 17:14   US Renal  03/30/2015  CLINICAL DATA:  Chronic kidney disease. EXAM: RENAL / URINARY TRACT ULTRASOUND COMPLETE COMPARISON:  CT, 12/10/2014 FINDINGS: Right Kidney: Length: 10.0 cm. Increased parenchymal echogenicity. Several cysts. There is a 12 mm upper pole cyst with adjacent 14 mm cyst. There is a 2.2 cm lateral cyst. No solid masses or stones. No hydronephrosis. Left Kidney: Length: 11.2 cm. Normal overall renal parenchymal echogenicity. 3.4 cm cyst arises from the upper  pole. Hypoechoic anechoic the mm lesion lies in the midpole, not well-defined but likely an additional cyst. No other masses. No stones. No hydronephrosis. Bladder: Prostate gland indents the bladder base. Prostate measures 4.7 x 5.9 x 6.0 cm. No bladder mass. No convincing wall thickening. There are bilateral pleural effusions. Ascites small amount of free fluid is seen adjacent to the bladder in the pelvis. IMPRESSION: 1. No hydronephrosis. 2. Increased parenchymal echogenicity on the right suggests medical renal disease. 3. Bilateral renal  cysts. One presumed 8 mm cyst in the left kidney at the midpole is not fully characterized on this exam. Recommend followup ultrasound 4-6 months. 4. Bladder wall thickening noted on the prior CT is not evident on ultrasound. 5. Enlarged prostate gland. 6. Pleural effusions with a small amount of ascites collecting in the posterior pelvis. Electronically Signed   By: Amie Portlandavid  Ormond M.D.   On: 03/30/2015 19:36   Medications: I have reviewed the patient's current medications. Scheduled Meds: . amiodarone  200 mg Oral Daily  . apixaban  5 mg Oral BID  . calcium-vitamin D  1 tablet Oral Q breakfast  . carvedilol  12.5 mg Oral BID WC  . famotidine  20 mg Oral Daily  . furosemide  60 mg Intravenous BID  . multivitamin with minerals  1 tablet Oral Daily  . omega-3 acid ethyl esters  1 g Oral Daily  . terazosin  10 mg Oral QHS   Continuous Infusions:  PRN Meds:.sodium chloride, docusate sodium, fluticasone, ondansetron (ZOFRAN) IV **OR** ondansetron, promethazine **OR** promethazine Assessment/Plan:  Volume Overload 2/2 progression of CKD stage 4 vs CHF-- Pt is down 1 lb from yesterday w/ -1.3L UOP. He still has crackles on exam w/ pedal edema. Creatinine bumped slightly, will continue diuresis w/ lasix IV 60mg  and repeat RFP in the morning.  - Nephrology following, further work up for reversible/treatable causes of progressive CKD w/ Anti-DS-DNA, ANA, C3/C4, Cryo, GMB Ab, ANCA which are pending - Cardiology following, TTE ordered, if EF is reduced from prior can start nitrate + hydralazine  - TSH elevated and FT4 WNL, thus likely has subclinical hypothyroidism which could be due to amiodarone he is on. Will repeat TSH in case it was transiently elevated. If TSH >10 tomorrow will start synthroid tx.   - Furosemide 60 mg IV BID - Daily Weights, Strict I/O's  CAD (RCA DES in 2006): T-wave inversions noted in V1-V6 on EKG. Currently cycling troponins. He has been seen by Cardiology, and he is not a  candidate for LHC at this time (contrast-induced nephropathy). They would only consider a cath if he had chest pain symptoms consistent with ACS. Medical management at time.  - d/c'd pravastatin due to nausea, if still having nausea overnight can restart  - Carvedilol 12.5 mg BID  Anemia: Hgb consistently in the 8's.Anemia panel pending. Nephrology considering Aranesp.  Atrial Flutter: Unsuccessful ablation 01/2015. He is not a candidate for flutter ablation as recent EP study showed no inducible/ablatable right sided atrial flutter. - Amiodarone 200 mg daily - Eliquis 5 mg daily - Carvedilol 12.5 mg BID  BPH: Terazosin 10 mg daily  DVT Prophylaxis: Eliquis  Dispo: Disposition is deferred at this time, awaiting improvement of current medical problems.    The patient does have a current PCP Doneen Poisson(Lawrence Klima, MD) and [facility].  The patient does not have transportation limitations that hinder transportation to clinic appointments.  .Services Needed at time of discharge: Y =  Yes, Blank = No PT:   OT:   RN:   Equipment:   Other:     LOS: 2 days   Denton Brick, MD 03/31/2015, 8:54 AM

## 2015-03-31 NOTE — Progress Notes (Signed)
[facility] Rounding Note  Subjective:  Issues with nausea requiring phenergan Not dyspneic but still quite edematous  Objective Vital signs in last 24 hours: Filed Vitals:   03/30/15 2019 03/30/15 2112 03/31/15 0510 03/31/15 1044  BP: 134/77 138/63 102/58 114/67  Pulse: 87 55 51 56  Temp: 98.4 F (36.9 C) 97.4 F (36.3 C) 98.5 F (36.9 C) 98.4 F (36.9 C)  TempSrc: Oral Oral Oral Oral  Resp: 19 20 19 20   Height: 5\' 8"  (1.727 m)     Weight: 80.5 kg (177 lb 7.5 oz) 80.2 kg (176 lb 12.9 oz)    SpO2: 100% 95% 90% 98%   Weight change: -7.2 kg (-15 lb 14 oz)  Intake/Output Summary (Last 24 hours) at 03/31/15 1314 Last data filed at 03/31/15 0900  Gross per 24 hour  Intake    600 ml  Output   1250 ml  Net   -650 ml   Physical Exam: BP 114/67 mmHg  Pulse 56  Temp(Src) 98.4 F (36.9 C) (Oral)  Resp 20  Ht 5\' 8"  (1.727 m)  Wt 80.2 kg (176 lb 12.9 oz)  BMI 26.89 kg/m2  SpO2 98%  Gen: Very nice soft spoken  AAM  NAD  Skin: no rash, cyanosis Neck: JVP 5-6 cm Chest: Crackles both bases Heart: Regular, S1S2 no S3 Abdomen: Protuberant, no focal tenderness. + BS, no bruits Ext: Wearing RLE prosthesis. L+ LLE edema pitting Neuro: alert, Ox3, no focal deficit No asterixus   Labs: Basic Metabolic Panel:  Recent Labs Lab 03/29/15 1042 03/30/15 0555 03/31/15 0538  NA 142 144 143  K 4.6 4.5 4.7  CL 115* 116* 112*  CO2 19* 19* 21*  GLUCOSE 106* 93 107*  BUN 112* 109* 110*  CREATININE 4.88* 4.84* 5.05*  CALCIUM 8.5* 8.7* 8.6*  PHOS  --  5.3* 5.3*   Liver Function Tests:  Recent Labs Lab 03/30/15 0555 03/31/15 0538  ALBUMIN 2.8* 2.8*   CBC:  Recent Labs Lab 03/30/15 0555 03/31/15 0538  WBC 5.1 4.9  HGB 8.5* 8.7*  HCT 26.7* 27.4*  MCV 70.3* 70.3*  PLT 115* 148*     Recent Labs Lab 03/30/15 0757 03/30/15 1510 03/30/15 1910  TROPONINI <0.03 <0.03 0.03   Results for [name] [name], [name] [name] (MRN [id number]) as of 03/31/2015 13:17  Ref. Range 03/31/2015 05:38   Iron Latest Ref Range: 45-182 ug/dL 26 (L)  UIBC Latest Units: ug/dL 409  TIBC Latest Ref Range: 250-450 ug/dL 811  Saturation Ratios Latest Ref Range: 17.9-39.5 % 8 (L)  Ferritin Latest Ref Range: 24-336 ng/mL 89    Studies/Results: X-ray Chest Pa And Lateral  03/29/2015  CLINICAL DATA:  CHF exacerbation. EXAM: CHEST  2 VIEW COMPARISON:  02/12/2015 FINDINGS: Lordotic technique is demonstrated. Lungs are hypoinflated and demonstrate small bilateral pleural effusions right greater than left without significant change. Likely associated bibasilar atelectasis. Stable moderate cardiomegaly. Improved perihilar prominence likely mild residual vascular congestion. Remainder of the exam is unchanged. IMPRESSION: Stable cardiomegaly with improved mild vascular congestion. Small bilateral pleural effusions right greater left without significant change. Electronically Signed   By: Elberta Fortis M.D.   On: 03/29/2015 17:14   US Renal  03/30/2015  CLINICAL DATA:  Chronic kidney disease. EXAM: RENAL / URINARY TRACT ULTRASOUND COMPLETE COMPARISON:  CT, 12/10/2014 FINDINGS: Right Kidney: Length: 10.0 cm. Increased parenchymal echogenicity. Several cysts. There is a 12 mm upper pole cyst with adjacent 14 mm cyst. There is a 2.2 cm lateral cyst. No solid masses or  stones. No hydronephrosis. Left Kidney: Length: 11.2 cm. Normal overall renal parenchymal echogenicity. 3.4 cm cyst arises from the upper pole. Hypoechoic anechoic the mm lesion lies in the midpole, not well-defined but likely an additional cyst. No other masses. No stones. No hydronephrosis. Bladder: Prostate gland indents the bladder base. Prostate measures 4.7 x 5.9 x 6.0 cm. No bladder mass. No convincing wall thickening. There are bilateral pleural effusions. Ascites small amount of free fluid is seen adjacent to the bladder in the pelvis. IMPRESSION: 1. No hydronephrosis. 2. Increased parenchymal echogenicity on the right suggests medical renal disease. 3.  Bilateral renal cysts. One presumed 8 mm cyst in the left kidney at the midpole is not fully characterized on this exam. Recommend followup ultrasound 4-6 months. 4. Bladder wall thickening noted on the prior CT is not evident on ultrasound. 5. Enlarged prostate gland. 6. Pleural effusions with a small amount of ascites collecting in the posterior pelvis. Electronically Signed   By: Amie Portlandavid  Ormond M.D.   On: 03/30/2015 19:36   Medications:   . amiodarone  200 mg Oral Daily  . apixaban  5 mg Oral BID  . calcium-vitamin D  1 tablet Oral Q breakfast  . carvedilol  12.5 mg Oral BID WC  . famotidine  20 mg Oral Daily  . furosemide  60 mg Intravenous BID  . multivitamin with minerals  1 tablet Oral Daily  . omega-3 acid ethyl esters  1 g Oral Daily  . terazosin  10 mg Oral QHS   Background 70 y.o. year-old AA male with a background of DM2 (no longer on insulin), HTN, HLD, PAD with R AKA, CAD with prior stenting, systolic heart failure (LVEF 50% 07/2014), atrial fib/flutter, chronic hepatitis C (never treated). He was admitted to the hospital with progressive dyspnea, edema, weight gain, volume overload->acutye on chronic heart failure. Creatinine on admission was noted to be 4.88. On chart review, he has had elevated creatinines dating back at least 5 years, and has had dipstick proteinuria since at least 2007. I also not the presence of microscopic hematuria since 11/2014. [facility] includes a negative SPEP/UPEP in 2008, ultrasound in 2014 that showed 10.5, 11.2 cm kidneys. He is Hep C + with a viral load of 4,720,000 that has never been treated (11/2014). Appears his CKD has been progressive from Stage 3 to Stage 4/5 over the course of 2016 and abruptly worse in the context of his current admission. He has anemia with Hb of 8.5 and a mild elevation of PTH (107 11/2014). We are asked to see and evaluate.  Impression/Plan  1. Subacute/acute AKI - more rapidly progressive over  the past 6 months, longstanding proteinuria (>7 years) which could be compatible with diabetic nephropathy, but with the appearance of microscopic hematuria on recent 11/2014 UA raised question of an underlying glomerulonephritis causing the more rapid subacute loss of renal function (which may have been acutely compounded by his heart failure). (Curiously his UA yesterday did NOT show hematuria or proteinuria - ? 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 Will need to investigate for reversible/treatable causes but he is unfortunately at a very advanced stage already. Multiple labs pending.  He maintains that he will never undergo dialysis. I am suspicious that his nausea is uremic... 2. Fluid overload - Agree with diuresis.  3. Anemia - TSat quite low, Fe low. Replete  with Feraheme, then start Aranesp (ordered). 4. CAD - per cards 5. DM - previously on insulin. Came off insulin most likely d/t longer insulin t 1/2 with worsening renal failure.  Camille Bal, MD [facility] [phone] pager 03/31/2015, 1:14 PM

## 2015-03-31 NOTE — Progress Notes (Signed)
  Echocardiogram 2D Echocardiogram has been performed.  Arvil ChacoFoster, Ryen Heitmeyer 03/31/2015, 10:30 AM

## 2015-04-01 ENCOUNTER — Inpatient Hospital Stay (HOSPITAL_COMMUNITY): Payer: Medicare Other

## 2015-04-01 DIAGNOSIS — I872 Venous insufficiency (chronic) (peripheral): Secondary | ICD-10-CM

## 2015-04-01 DIAGNOSIS — I509 Heart failure, unspecified: Secondary | ICD-10-CM

## 2015-04-01 DIAGNOSIS — I5023 Acute on chronic systolic (congestive) heart failure: Secondary | ICD-10-CM

## 2015-04-01 DIAGNOSIS — I2510 Atherosclerotic heart disease of native coronary artery without angina pectoris: Secondary | ICD-10-CM

## 2015-04-01 DIAGNOSIS — N183 Chronic kidney disease, stage 3 (moderate): Secondary | ICD-10-CM

## 2015-04-01 DIAGNOSIS — R351 Nocturia: Secondary | ICD-10-CM

## 2015-04-01 DIAGNOSIS — I10 Essential (primary) hypertension: Secondary | ICD-10-CM

## 2015-04-01 DIAGNOSIS — N401 Enlarged prostate with lower urinary tract symptoms: Secondary | ICD-10-CM

## 2015-04-01 DIAGNOSIS — Z0000 Encounter for general adult medical examination without abnormal findings: Secondary | ICD-10-CM

## 2015-04-01 LAB — C4 COMPLEMENT: Complement C4, Body Fluid: 36 mg/dL (ref 14–44)

## 2015-04-01 LAB — RENAL FUNCTION PANEL
Albumin: 2.8 g/dL — ABNORMAL LOW (ref 3.5–5.0)
Anion gap: 8 (ref 5–15)
BUN: 105 mg/dL — ABNORMAL HIGH (ref 6–20)
CHLORIDE: 108 mmol/L (ref 101–111)
CO2: 22 mmol/L (ref 22–32)
CREATININE: 4.9 mg/dL — AB (ref 0.61–1.24)
Calcium: 8.3 mg/dL — ABNORMAL LOW (ref 8.9–10.3)
GFR calc non Af Amer: 11 mL/min — ABNORMAL LOW (ref >60)
GFR, EST AFRICAN AMERICAN: 12 mL/min — AB (ref >60)
GLUCOSE: 105 mg/dL — AB (ref 65–99)
Phosphorus: 4.4 mg/dL (ref 2.5–4.6)
Potassium: 4.3 mmol/L (ref 3.5–5.1)
Sodium: 138 mmol/L (ref 135–145)

## 2015-04-01 LAB — TSH: TSH: 16.528 u[IU]/mL — AB (ref 0.350–4.500)

## 2015-04-01 LAB — C3 COMPLEMENT: C3 Complement: 81 mg/dL — ABNORMAL LOW (ref 82–167)

## 2015-04-01 LAB — PARATHYROID HORMONE, INTACT (NO CA): PTH: 58 pg/mL (ref 15–65)

## 2015-04-01 MED ORDER — LEVOTHYROXINE SODIUM 50 MCG PO TABS
50.0000 ug | ORAL_TABLET | Freq: Every day | ORAL | Status: DC
  Administered 2015-04-02: 50 ug via ORAL
  Filled 2015-04-01: qty 1

## 2015-04-01 MED ORDER — FUROSEMIDE 10 MG/ML IJ SOLN
80.0000 mg | Freq: Two times a day (BID) | INTRAMUSCULAR | Status: DC
  Administered 2015-04-01 – 2015-04-02 (×2): 80 mg via INTRAVENOUS
  Filled 2015-04-01 (×2): qty 8

## 2015-04-01 MED ORDER — HYDRALAZINE HCL 10 MG PO TABS
10.0000 mg | ORAL_TABLET | Freq: Three times a day (TID) | ORAL | Status: DC
  Administered 2015-04-01 – 2015-04-02 (×4): 10 mg via ORAL
  Filled 2015-04-01 (×4): qty 1

## 2015-04-01 MED ORDER — TERAZOSIN HCL 5 MG PO CAPS
5.0000 mg | ORAL_CAPSULE | Freq: Every day | ORAL | Status: DC
  Administered 2015-04-01: 5 mg via ORAL
  Filled 2015-04-01 (×2): qty 1

## 2015-04-01 MED ORDER — PERFLUTREN LIPID MICROSPHERE
1.0000 mL | INTRAVENOUS | Status: AC | PRN
  Filled 2015-04-01: qty 10

## 2015-04-01 MED ORDER — ISOSORBIDE MONONITRATE ER 30 MG PO TB24
30.0000 mg | ORAL_TABLET | Freq: Every day | ORAL | Status: DC
  Administered 2015-04-01 – 2015-04-02 (×2): 30 mg via ORAL
  Filled 2015-04-01 (×2): qty 1

## 2015-04-01 NOTE — Progress Notes (Signed)
[facility] Rounding Note  Subjective:  Issues with nausea requiring phenergan Not dyspneic but still quite edematous  Objective Vital signs in last 24 hours: Filed Vitals:   03/31/15 1728 03/31/15 2057 04/01/15 0530 04/01/15 0841  BP: 110/63 123/55 128/65 127/53  Pulse: 81 50 50 55  Temp: 98.3 F (36.8 C) 98.6 F (37 C) 98.4 F (36.9 C) 97.9 F (36.6 C)  TempSrc: Oral Oral Oral Oral  Resp: 20 22 20 18   Height:      Weight:  80 kg (176 lb 5.9 oz)    SpO2: 98% 97% 96% 95%   Weight change: -0.5 kg (-1 lb 1.6 oz)  Intake/Output Summary (Last 24 hours) at 04/01/15 1119 Last data filed at 04/01/15 1045  Gross per 24 hour  Intake    480 ml  Output   1100 ml  Net   -620 ml   Physical Exam: BP 127/53 mmHg  Pulse 55  Temp(Src) 97.9 F (36.6 C) (Oral)  Resp 18  Ht 5\' 8"  (1.727 m)  Wt 80 kg (176 lb 5.9 oz)  BMI 26.82 kg/m2  SpO2 95%  Gen: Very nice soft spoken  AAM  NAD  Skin: no rash, cyanosis Neck: JVP 5-6 cm Chest: Crackles both bases Heart: Regular, S1S2 no S3 Abdomen: Protuberant, no focal tenderness. + BS, no bruits Ext: Wearing RLE prosthesis. L+ LLE edema pitting Neuro: alert, Ox3, no focal deficit No asterixus   Labs: Basic Metabolic Panel:  Recent Labs Lab 03/29/15 1042 03/30/15 0555 03/31/15 0538 04/01/15 0459  NA 142 144 143 138  K 4.6 4.5 4.7 4.3  CL 115* 116* 112* 108  CO2 19* 19* 21* 22  GLUCOSE 106* 93 107* 105*  BUN 112* 109* 110* 105*  CREATININE 4.88* 4.84* 5.05* 4.90*  CALCIUM 8.5* 8.7* 8.6* 8.3*  PHOS  --  5.3* 5.3* 4.4   Liver Function Tests:  Recent Labs Lab 03/30/15 0555 03/31/15 0538 04/01/15 0459  ALBUMIN 2.8* 2.8* 2.8*   CBC:  Recent Labs Lab 03/30/15 0555 03/31/15 0538  WBC 5.1 4.9  HGB 8.5* 8.7*  HCT 26.7* 27.4*  MCV 70.3* 70.3*  PLT 115* 148*     Recent Labs Lab 03/30/15 0757 03/30/15 1510 03/30/15 1910  TROPONINI <0.03 <0.03 0.03   Results for EDREES, VALENT (MRN [id number]) as of 03/31/2015 13:17  Ref. Range 03/31/2015 05:38  Iron Latest Ref Range: 45-182 ug/dL 26 (L)  UIBC Latest Units: ug/dL 119  TIBC Latest Ref Range: 250-450 ug/dL 147  Saturation Ratios Latest Ref Range: 17.9-39.5 % 8 (L)  Ferritin Latest Ref Range: 24-336 ng/mL 89   03/31/15 ECHO LV EF: 20% - 25% (down from 50% 08/2014). Multiple WMA's. Mod pulm HTN. Cannot exclude atrial thrombus.   Studies/Results: US Renal  03/30/2015  CLINICAL DATA:  Chronic kidney disease. EXAM: RENAL / URINARY TRACT ULTRASOUND COMPLETE COMPARISON:  CT, 12/10/2014 FINDINGS: Right Kidney: Length: 10.0 cm. Increased parenchymal echogenicity. Several cysts. There is a 12 mm upper pole cyst with adjacent 14 mm cyst. There is a 2.2 cm lateral cyst. No solid masses or stones. No hydronephrosis. Left Kidney: Length: 11.2 cm. Normal overall renal parenchymal echogenicity. 3.4 cm cyst arises from the upper pole. Hypoechoic anechoic the mm lesion lies in the midpole, not well-defined but likely an additional cyst. No other masses. No stones. No hydronephrosis. Bladder: Prostate gland indents the bladder base. Prostate measures 4.7 x 5.9 x 6.0 cm. No bladder mass. No convincing wall thickening. There are bilateral pleural effusions.  Ascites small amount of free fluid is seen adjacent to the bladder in the pelvis. IMPRESSION: 1. No hydronephrosis. 2. Increased parenchymal echogenicity on the right suggests medical renal disease. 3. Bilateral renal cysts. One presumed 8 mm cyst in the left kidney at the midpole is not fully characterized on this exam. Recommend followup ultrasound 4-6 months. 4. Bladder wall thickening noted on the prior CT is not evident on ultrasound. 5. Enlarged prostate gland. 6. Pleural effusions with a small amount of ascites collecting in the posterior pelvis. Electronically Signed   By: Amie Portlandavid  Ormond M.D.   On: 03/30/2015 19:36   Medications:   . amiodarone  200 mg Oral Daily  . apixaban  5 mg Oral BID  . calcium-vitamin D  1 tablet Oral Q  breakfast  . carvedilol  12.5 mg Oral BID WC  . darbepoetin (ARANESP) injection - NON-DIALYSIS  100 mcg Subcutaneous Q Sun-1800  . famotidine  20 mg Oral Daily  . furosemide  60 mg Intravenous BID  . hydrALAZINE  10 mg Oral 3 times per day  . isosorbide mononitrate  30 mg Oral Daily  . [START ON 04/02/2015] levothyroxine  50 mcg Oral QAC breakfast  . multivitamin with minerals  1 tablet Oral Daily  . omega-3 acid ethyl esters  1 g Oral Daily  . terazosin  5 mg Oral QHS   Background 70 y.o. year-old AA male with a background of DM2 (no longer on insulin), HTN, HLD, PAD with R AKA, CAD with prior stenting, systolic heart failure (LVEF 50% 07/2014), atrial fib/flutter, chronic hepatitis C (never treated). He was admitted to the hospital with progressive dyspnea, edema, weight gain, volume overload->acutye on chronic heart failure. Creatinine on admission was noted to be 4.88. On chart review, he has had elevated creatinines dating back at least 5 years, and has had dipstick proteinuria since at least 2007. I also note the presence of microscopic hematuria since 11/2014. [facility] negative SPEP/UPEP in 2008, ultrasound in 2014 that showed 10.5, 11.2 cm kidneys. He is Hep C + with a viral load of 4,720,000 that has never been treated (11/2014). Appears his CKD has been progressive from Stage 3 to Stage 4/5 over the course of 2016 and abruptly worse in the context of his current admission with CHF/volume overoload. He has anemia with Hb of 8.5 and a mild elevation of PTH (107 11/2014). We are asked to see and evaluate.  Impression/Plan  1. Subacute/acute AKI - more rapidly progressive over the past 6 months, longstanding proteinuria (>7 years) which could be compatible with diabetic nephropathy. With the appearance of microscopic hematuria on recent 11/2014 UA raised question of an underlying glomerulonephritis causing the more rapid subacute loss of renal function,   which may have been acutely compounded by his heart failure - ECHO findings support contribution cardiorenal component with new reduction in EF to 20-25% and pulm HTN). (Curiously his UA 1/6 did NOT show hematuria or proteinuria - ? 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 Looking for reversible/treatable causes but he is unfortunately at a very advanced stage already. Multiple labs still pending.  He reiterates (with his wife present)  that he will never undergo dialysis.  2. Acute on chronic systolic HF with newly recognized EF reduction/multiple WMA's. Diuresis. I think need to be a little more aggressive with this. Increase to 80  Q12H 3. CAD - h/o PCI/DES to RCA 2006. Multiple new WMA's on ECHO with reduced EF. Not a cath candidate at this time.  4. H/o aflutter - sinus brady now 5. Anemia - TSat quite low, Fe low. Replete with Feraheme (510 dosed 1/7), Aranesp (100 dosed 1/8) 6. DM - previously on insulin. Came off insulin most likely d/t longer insulin t 1/2 with worsening renal failure.  Camille Bal, MD [facility] [phone] pager 04/01/2015, 11:19 AM

## 2015-04-01 NOTE — Progress Notes (Signed)
Subjective: Pt states nausea has improved, informed of ECHO results from yesterday.   Objective: Vital signs in last 24 hours: Filed Vitals:   03/31/15 1728 03/31/15 2057 04/01/15 0530 04/01/15 0841  BP: 110/63 123/55 128/65 127/53  Pulse: 81 50 50 55  Temp: 98.3 F (36.8 C) 98.6 F (37 C) 98.4 F (36.9 C) 97.9 F (36.6 C)  TempSrc: Oral Oral Oral Oral  Resp: 20 22 20 18   Height:      Weight:  176 lb 5.9 oz (80 kg)    SpO2: 98% 97% 96% 95%   Weight change: -1 lb 1.6 oz (-0.5 kg)  Intake/Output Summary (Last 24 hours) at 04/01/15 1022 Last data filed at 04/01/15 0818  Gross per 24 hour  Intake    240 ml  Output   1100 ml  Net   -860 ml   Physical Exam: General: NAD, laying in bed w/ wife at bedside HEENT: No scleral icterus, no tonsillar erythema or exudate, Moist Mucous Membranes.  Cardiovascular: RRR. No friction rub. No S3 Pulmonary: bibasilar crackles, no wheezing. Abdominal: Soft. NT/ND. Normal bowel sounds Extremities: trace left lower extremity pitting edema. Right leg BKA Neurological: AAOx3 Psychiatric: Normal behavior and affect  Lab Results: Basic Metabolic Panel:  Recent Labs Lab 03/31/15 0538 04/01/15 0459  NA 143 138  K 4.7 4.3  CL 112* 108  CO2 21* 22  GLUCOSE 107* 105*  BUN 110* 105*  CREATININE 5.05* 4.90*  CALCIUM 8.6* 8.3*  PHOS 5.3* 4.4   Liver Function Tests:  Recent Labs Lab 03/31/15 0538 04/01/15 0459  ALBUMIN 2.8* 2.8*   CBC:  Recent Labs Lab 03/30/15 0555 03/31/15 0538  WBC 5.1 4.9  HGB 8.5* 8.7*  HCT 26.7* 27.4*  MCV 70.3* 70.3*  PLT 115* 148*   Cardiac Enzymes:  Recent Labs Lab 03/30/15 0757 03/30/15 1510 03/30/15 1910  TROPONINI <0.03 <0.03 0.03   Thyroid Function Tests:  Recent Labs Lab 03/30/15 1510 04/01/15 0459  TSH  --  16.528*  FREET4 0.70  --     Studies/Results: [place] Renal  03/30/2015  CLINICAL DATA:  Chronic kidney disease. EXAM: RENAL / URINARY TRACT ULTRASOUND COMPLETE COMPARISON:   CT, 12/10/2014 FINDINGS: Right Kidney: Length: 10.0 cm. Increased parenchymal echogenicity. Several cysts. There is a 12 mm upper pole cyst with adjacent 14 mm cyst. There is a 2.2 cm lateral cyst. No solid masses or stones. No hydronephrosis. Left Kidney: Length: 11.2 cm. Normal overall renal parenchymal echogenicity. 3.4 cm cyst arises from the upper pole. Hypoechoic anechoic the mm lesion lies in the midpole, not well-defined but likely an additional cyst. No other masses. No stones. No hydronephrosis. Bladder: Prostate gland indents the bladder base. Prostate measures 4.7 x 5.9 x 6.0 cm. No bladder mass. No convincing wall thickening. There are bilateral pleural effusions. Ascites small amount of free fluid is seen adjacent to the bladder in the pelvis. IMPRESSION: 1. No hydronephrosis. 2. Increased parenchymal echogenicity on the right suggests medical renal disease. 3. Bilateral renal cysts. One presumed 8 mm cyst in the left kidney at the midpole is not fully characterized on this exam. Recommend followup ultrasound 4-6 months. 4. Bladder wall thickening noted on the prior CT is not evident on ultrasound. 5. Enlarged prostate gland. 6. Pleural effusions with a small amount of ascites collecting in the posterior pelvis. Electronically Signed   By: Amie Portlandavid  Ormond M.D.   On: 03/30/2015 19:36   Medications: I have reviewed the patient's current medications. Scheduled Meds: .  amiodarone  200 mg Oral Daily  . apixaban  5 mg Oral BID  . calcium-vitamin D  1 tablet Oral Q breakfast  . carvedilol  12.5 mg Oral BID WC  . darbepoetin (ARANESP) injection - NON-DIALYSIS  100 mcg Subcutaneous Q Sun-1800  . famotidine  20 mg Oral Daily  . furosemide  60 mg Intravenous BID  . hydrALAZINE  10 mg Oral 3 times per day  . isosorbide mononitrate  30 mg Oral Daily  . multivitamin with minerals  1 tablet Oral Daily  . omega-3 acid ethyl esters  1 g Oral Daily  . terazosin  5 mg Oral QHS   Continuous Infusions:  PRN  Meds:.sodium chloride, docusate sodium, fluticasone, ondansetron (ZOFRAN) IV **OR** ondansetron, promethazine **OR** promethazine Assessment/Plan:  Volume Overload 2/2 cardiorenal syndrome-- ECHO yesterday revealed EF of 20-25% which is decreased from EF of 50% in May 2016. Having good diuresis w/ IV lasix 60mg  BID, decreased crackles and improved pedal edema on exam today from yesterday.  - Nephrology following, further work up for reversible/treatable causes of progressive CKD w/ Anti-DS-DNA, ANA, C3/C4, Cryo, GMB Ab, ANCA which are pending - Cardiology following, started imdur 30mg  QD and hydralazine 10mg  TID for afterload reduction.  - TSH elevated and FT4 WNL, thus likely has subclinical hypothyroidism which could be due to amiodarone he is on. TSH elevated again this morning at 16.528, will start on synthroid today.  - Furosemide 60 mg IV BID - Daily Weights, Strict I/O's - PT consulted  CAD (RCA DES in 2006): T-wave inversions noted in V1-V6 on EKG. Currently cycling troponins. He has been seen by Cardiology, and he is not a candidate for LHC at this time (contrast-induced nephropathy). They would only consider a cath if he had chest pain symptoms consistent with ACS. Medical management at this time.  - d/c'd pravastatin due to nausea, nausea improved from day prior, LDL 11/2014 72 - Carvedilol 12.5 mg BID and omega 3   Anemia: Hgb consistently in the 8's.Anemia panel pending. Nephrology started Aranesp. Anemia panel w/ low iron, normal ferritin, nl TIBC. Likely anemia of chronic disease for CKD and CHF.   Atrial Flutter: Unsuccessful ablation 01/2015. He is not a candidate for flutter ablation as recent EP study showed no inducible/ablatable right sided atrial flutter. - Amiodarone 200 mg daily - Eliquis 5 mg daily - Carvedilol 12.5 mg BID  BPH: Terazosin 10 mg decreased to 5mg  as imdur and hydralazine were added today. Will continue to monitor BP.   DVT Prophylaxis:  Eliquis  Dispo: Disposition is deferred at this time, awaiting improvement of current medical problems.    The patient does have a current PCP Doneen Poisson, MD) and does need an [facility] hospital follow-up appointment after discharge.  The patient does not have transportation limitations that hinder transportation to clinic appointments.  .Services Needed at time of discharge: Y = Yes, Blank = No PT:   OT:   RN:   Equipment:   Other:     LOS: 3 days   Denton Brick, MD 04/01/2015, 10:22 AM

## 2015-04-01 NOTE — Progress Notes (Addendum)
SUBJECTIVE:  No complaints  OBJECTIVE:   Vitals:   Filed Vitals:   03/31/15 1044 03/31/15 1728 03/31/15 2057 04/01/15 0530  BP: 114/67 110/63 123/55 128/65  Pulse: 56 81 50 50  Temp: 98.4 F (36.9 C) 98.3 F (36.8 C) 98.6 F (37 C) 98.4 F (36.9 C)  TempSrc: Oral Oral Oral Oral  Resp: 20 20 22 20   Height:      Weight:   176 lb 5.9 oz (80 kg)   SpO2: 98% 98% 97% 96%   I&O's:   Intake/Output Summary (Last 24 hours) at 04/01/15 16100821 Last data filed at 04/01/15 0818  Gross per 24 hour  Intake    480 ml  Output   1500 ml  Net  -1020 ml   TELEMETRY: Reviewed telemetry pt in NSR:     PHYSICAL EXAM General: Well developed, well nourished, in no acute distress Head: Eyes PERRLA, No xanthomas.   Normal cephalic and atramatic  Lungs:   Clear bilaterally to auscultation and percussion. Heart:   HRRR S1 S2 Pulses are 2+ & equal. Abdomen: Bowel sounds are positive, abdomen soft and non-tender without masses  Extremities:   No clubbing, cyanosis .  DP +1.  != edema Neuro: Alert and oriented X 3. Psych:  Good affect, responds appropriately   LABS: Basic Metabolic Panel:  Recent Labs  96/06/5399/07/17 0538 04/01/15 0459  NA 143 138  K 4.7 4.3  CL 112* 108  CO2 21* 22  GLUCOSE 107* 105*  BUN 110* 105*  CREATININE 5.05* 4.90*  CALCIUM 8.6* 8.3*  PHOS 5.3* 4.4   Liver Function Tests:  Recent Labs  03/31/15 0538 04/01/15 0459  ALBUMIN 2.8* 2.8*   No results for input(s): LIPASE, AMYLASE in the last 72 hours. CBC:  Recent Labs  03/30/15 0555 03/31/15 0538  WBC 5.1 4.9  HGB 8.5* 8.7*  HCT 26.7* 27.4*  MCV 70.3* 70.3*  PLT 115* 148*   Cardiac Enzymes:  Recent Labs  03/30/15 0757 03/30/15 1510 03/30/15 1910  TROPONINI <0.03 <0.03 0.03   BNP: Invalid input(s): POCBNP D-Dimer: No results for input(s): DDIMER in the last 72 hours. Hemoglobin A1C: No results for input(s): HGBA1C in the last 72 hours. Fasting Lipid Panel: No results for input(s): CHOL,  HDL, LDLCALC, TRIG, CHOLHDL, LDLDIRECT in the last 72 hours. Thyroid Function Tests:  Recent Labs  04/01/15 0459  TSH 16.528*   Anemia Panel:  Recent Labs  03/31/15 0538  FERRITIN 89  TIBC 340  IRON 26*   Coag Panel:   Lab Results  Component Value Date   INR 1.39 12/12/2014   INR 1.67* 12/11/2014   INR 1.21 07/25/2014    RADIOLOGY: X-ray Chest Pa And Lateral  03/29/2015  CLINICAL DATA:  CHF exacerbation. EXAM: CHEST  2 VIEW COMPARISON:  02/12/2015 FINDINGS: Lordotic technique is demonstrated. Lungs are hypoinflated and demonstrate small bilateral pleural effusions right greater than left without significant change. Likely associated bibasilar atelectasis. Stable moderate cardiomegaly. Improved perihilar prominence likely mild residual vascular congestion. Remainder of the exam is unchanged. IMPRESSION: Stable cardiomegaly with improved mild vascular congestion. Small bilateral pleural effusions right greater left without significant change. Electronically Signed   By: Elberta Fortisaniel  Boyle M.D.   On: 03/29/2015 17:14   [place] [facility]  03/30/2015  CLINICAL DATA:  Chronic kidney disease. EXAM: RENAL / URINARY TRACT ULTRASOUND COMPLETE COMPARISON:  CT, 12/10/2014 FINDINGS: Right Kidney: Length: 10.0 cm. Increased parenchymal echogenicity. Several cysts. There is a 12 mm upper pole cyst with  adjacent 14 mm cyst. There is a 2.2 cm lateral cyst. No solid masses or stones. No hydronephrosis. Left Kidney: Length: 11.2 cm. Normal overall renal parenchymal echogenicity. 3.4 cm cyst arises from the upper pole. Hypoechoic anechoic the mm lesion lies in the midpole, not well-defined but likely an additional cyst. No other masses. No stones. No hydronephrosis. Bladder: Prostate gland indents the bladder base. Prostate measures 4.7 x 5.9 x 6.0 cm. No bladder mass. No convincing wall thickening. There are bilateral pleural effusions. Ascites small amount of free fluid is seen adjacent to the bladder in the pelvis.  IMPRESSION: 1. No hydronephrosis. 2. Increased parenchymal echogenicity on the right suggests medical renal disease. 3. Bilateral renal cysts. One presumed 8 mm cyst in the left kidney at the midpole is not fully characterized on this exam. Recommend followup ultrasound 4-6 months. 4. Bladder wall thickening noted on the prior CT is not evident on ultrasound. 5. Enlarged prostate gland. 6. Pleural effusions with a small amount of ascites collecting in the posterior pelvis. Electronically Signed   By: Amie Portland M.D.   On: 03/30/2015 19:36    ASSESSMENT AND PLAN  Active Problems:  Essential hypertension  Type 2 diabetes mellitus with stage 4 chronic kidney disease (HCC)  CKD (chronic kidney disease) stage 4, GFR 15-29 ml/min (HCC)  Acute on chronic kidney failure (HCC)  CHF exacerbation (HCC)    1. Acute on Chronic CHF: Patient continues to be volume overloaded with 1+ left sided LEE. He has had a good diuretic response thus far and he notes subjective improvement. I/Os show good UOP, With -1.5L out yesterday and net neg 3.4L. Appreciate nephrology consultation. 2D echo showed severe LV dysfunction with EF 20-25% with multiple regional wall motion abnormalities. Continue Coreg. No ACE-ARB due to CKD. Will start nitrate + hydralazine, in place of an ACE/ARB for afterload reduction, if BP allows. Given his EKG changes and known h/o CAD as well as significantly reduced LVF on echo, coronary ischemia may be a potential cause of his HF. However, given his renal function, he is not a candidate for LHC at this time. With a TSH level of 18, hypothyroidism may also be a potential etiology. Free T3 pending and free T4 is noraml. Continue strict I/Os and low sodium diet. Renal function is slowly improving with diuresis.  2. Abnormal EKG: EKG shows new TWIs in leads V1-V6. He denies CP but presents with acute CHF exacerbation with echo now showing markedly reduced LVF with multiple regional wall motion  abnormalities. He has known coronary disease, s/p PCI + DES to the RCA in 2006. He is not a candidate for LHC at this time. We will continue to treat medically as long as he is CP free. Continue BB and statin for CAD. He is not on ASA given h/o anemia and need for Eliquis for PAF.   3. H/o Atrial Flutter: NSR/ sinus brady on telemetry with rate in the 50s. He is not a candidate for flutter ablation as recent EP study showed no inducible/ablatable right sided atrial flutter. Continue rhythm control strategy with amiodarone. Continue Eliquis for a/c. His Hgb is at his baseline and stable.   4. Acute on Chronic Kidney Disease: per primary and nephrology.   5. HTN: controlled on Coreg.  No room to increase Coreg given resting bradycardia. BP on the lower side of normal.  Will decrease hytrin to allow higher BP to initiate nitrate/hydralazine for afterload reduction.  6. Abnormal Thyroid Function Test: TSH abnormal  at 98. Free T3 Pending. Primary team to follow and manage.   7. DM: per primary.   8. HLD: continue statin. Last lipid panel 12/11/14 showed LDL to be controlled at 72 mg/dL.      Quintella Reichert, MD  04/01/2015  8:21 AM

## 2015-04-01 NOTE — Progress Notes (Signed)
Physical Therapy Treatment Patient Details Name: [name] [name] MRN: [id number] DOB: 11/16/1945 Today's Date: 04/01/2015    History of Present Illness 70 y/o male with PMH of CKD stage 4, chronic systolic CHF (EF 40%50%), aflutter, HTN, DM, CAD s/p RCA DES, HCV who p/w worsening SOB over the past 2 weeks. Patient complains of orthopnea, PND, DOE and worsening L LE edema.    PT Comments    Pt feels confident he can manage at home. Only agreeable to squat pivot transfer training today, declines to attempt sit<>stand transfer and gait again. Eager to return home. Wife present and observed therapy session. Encouraged pt to work with PT. Likely to refuse HHPT but I feel he would benefit due to weakness and great effort with mobility.  Follow Up Recommendations  Home health PT;Supervision for mobility/OOB (pt likely to refuse)     Equipment Recommendations  None recommended by PT    Recommendations for Other Services       Precautions / Restrictions Precautions Precautions: Fall Required Braces or Orthoses: Other Brace/Splint Other Brace/Splint: RLE AKA prosthesis Restrictions Weight Bearing Restrictions: No    Mobility  Bed Mobility Overal bed mobility: Modified Independent             General bed mobility comments: Pt does require extra time. Effortful but able to perform without physical assist.  Transfers Overall transfer level: Needs assistance Equipment used: None Transfers: Squat Pivot Transfers     Squat pivot transfers: Min guard     General transfer comment: Pt agreeable to practice transfer. Will not allow PT to assist but does require very close guard for safety. Pt performed minimal squat, closer to lateral scoot to and from bed/chair. Cues for chair placement for safety. Demonstrates fair UE strength. Encouraged increase push through LLE to assist with transfer.  Ambulation/Gait             General Gait Details: declined to practice   Stairs            Wheelchair Mobility    Modified Rankin (Stroke Patients Only)       Balance                                    Cognition Arousal/Alertness: Awake/alert Behavior During Therapy: WFL for tasks assessed/performed Overall Cognitive Status: Within Functional Limits for tasks assessed                      Exercises General Exercises - Lower Extremity Long Arc Quad: Strengthening;Left;15 reps;Seated    General Comments General comments (skin integrity, edema, etc.): Pt refusing to practice sit<>stand and gait training today.  Feels he can manage at home. Wife encourages pt to work more with PT.      Pertinent Vitals/Pain Pain Assessment: No/denies pain    Home Living                      Prior Function            PT Goals (current goals can now be found in the care plan section) Acute Rehab PT Goals Patient Stated Goal: Go home PT Goal Formulation: With patient Time For Goal Achievement: 04/13/15 Potential to Achieve Goals: Good Progress towards PT goals: Progressing toward goals    Frequency  Min 3X/week    PT Plan Current plan remains appropriate    Co-evaluation  End of Session   Activity Tolerance: Other (comment) (Self limiting today) Patient left: with call bell/phone within reach;in bed;with family/visitor present     Time: 9147-8295 PT Time Calculation (min) (ACUTE ONLY): 12 min  Charges:  $Therapeutic Activity: 8-22 mins                    G Codes:      Berton Mount 04-06-15, 5:05 PM Sunday [place], [place] [phone]

## 2015-04-02 DIAGNOSIS — N185 Chronic kidney disease, stage 5: Secondary | ICD-10-CM

## 2015-04-02 DIAGNOSIS — I132 Hypertensive heart and chronic kidney disease with heart failure and with stage 5 chronic kidney disease, or end stage renal disease: Principal | ICD-10-CM

## 2015-04-02 LAB — RENAL FUNCTION PANEL
ALBUMIN: 2.9 g/dL — AB (ref 3.5–5.0)
ANION GAP: 10 (ref 5–15)
BUN: 101 mg/dL — ABNORMAL HIGH (ref 6–20)
CO2: 22 mmol/L (ref 22–32)
Calcium: 8.5 mg/dL — ABNORMAL LOW (ref 8.9–10.3)
Chloride: 107 mmol/L (ref 101–111)
Creatinine, Ser: 5 mg/dL — ABNORMAL HIGH (ref 0.61–1.24)
GFR calc Af Amer: 12 mL/min — ABNORMAL LOW (ref >60)
GFR, EST NON AFRICAN AMERICAN: 11 mL/min — AB (ref >60)
Glucose, Bld: 99 mg/dL (ref 65–99)
POTASSIUM: 4.5 mmol/L (ref 3.5–5.1)
Phosphorus: 4.3 mg/dL (ref 2.5–4.6)
Sodium: 139 mmol/L (ref 135–145)

## 2015-04-02 LAB — ANTINUCLEAR ANTIBODIES, IFA: ANTINUCLEAR ANTIBODIES, IFA: NEGATIVE

## 2015-04-02 LAB — MPO/PR-3 (ANCA) ANTIBODIES
ANCA Proteinase 3: <3.5 U/mL (ref 0.0–3.5)
Myeloperoxidase Abs: <9 U/mL (ref 0.0–9.0)

## 2015-04-02 LAB — GLOMERULAR BASEMENT MEMBRANE ANTIBODIES: GBM AB: 6 U (ref 0–20)

## 2015-04-02 MED ORDER — ONDANSETRON HCL 4 MG PO TABS
4.0000 mg | ORAL_TABLET | Freq: Three times a day (TID) | ORAL | Status: DC | PRN
Start: 2015-04-02 — End: 2015-06-01

## 2015-04-02 MED ORDER — LEVOTHYROXINE SODIUM 25 MCG PO TABS: 25.0000 ug | ORAL_TABLET | Freq: Every day | ORAL | Status: DC

## 2015-04-02 MED ORDER — ISOSORBIDE MONONITRATE ER 30 MG PO TB24: 30.0000 mg | ORAL_TABLET | Freq: Every day | ORAL | Status: DC

## 2015-04-02 MED ORDER — HYDRALAZINE HCL 10 MG PO TABS: 10.0000 mg | ORAL_TABLET | Freq: Three times a day (TID) | ORAL | Status: DC

## 2015-04-02 MED ORDER — FUROSEMIDE 20 MG PO TABS: 60.0000 mg | ORAL_TABLET | Freq: Every day | ORAL | Status: DC

## 2015-04-02 MED ORDER — DARBEPOETIN ALFA 100 MCG/0.5ML IJ SOSY: 100.0000 ug | PREFILLED_SYRINGE | INTRAMUSCULAR | Status: DC

## 2015-04-02 MED ORDER — TERAZOSIN HCL 5 MG PO CAPS: 5.0000 mg | ORAL_CAPSULE | Freq: Every day | ORAL | Status: DC

## 2015-04-02 NOTE — Progress Notes (Signed)
[name] [name] to be D/C'd Home per MD order.  Discussed prescriptions and follow up appointments with the patient. Prescriptions given to patient, medication list explained in detail. Pt verbalized understanding.    Medication List    TAKE these medications        amiodarone 200 MG tablet  Commonly known as:  PACERONE  Take 1 tablet (200 mg total) by mouth daily.     amLODipine 10 MG tablet  Commonly known as:  NORVASC  Take 1 tablet (10 mg total) by mouth daily.     calcium citrate-vitamin D 315-200 MG-UNIT tablet  Commonly known as:  CITRACAL+D  Take 1 tablet by mouth daily.     carvedilol 12.5 MG tablet  Commonly known as:  COREG  Take 1 tablet (12.5 mg total) by mouth 2 (two) times daily with a meal.     Darbepoetin Alfa 100 MCG/0.5ML Sosy injection  Commonly known as:  ARANESP  Inject 0.5 mLs (100 mcg total) into the skin every Sunday at 6pm.     docusate sodium 100 MG capsule  Commonly known as:  COLACE  Take 100 mg by mouth at bedtime as needed for moderate constipation (constipation).     ELIQUIS 5 MG Tabs tablet  Generic drug:  apixaban  Take 5 mg by mouth 2 (two) times daily.     famotidine 20 MG tablet  Commonly known as:  PEPCID  Take 1 tablet (20 mg total) by mouth 2 (two) times daily.     fish oil-omega-3 fatty acids 1000 MG capsule  Take 1 g by mouth daily.     fluticasone 50 MCG/ACT nasal spray  Commonly known as:  FLONASE  Place 1 spray into both nostrils daily as needed for allergies.     furosemide 20 MG tablet  Commonly known as:  LASIX  Take 3 tablets (60 mg total) by mouth daily.     hydrALAZINE 10 MG tablet  Commonly known as:  APRESOLINE  Take 1 tablet (10 mg total) by mouth every 8 (eight) hours.     hydroxypropyl methylcellulose / hypromellose 2.5 % ophthalmic solution  Commonly known as:  ISOPTO TEARS / GONIOVISC  Place 2 drops into both eyes 4 (four) times daily as needed for dry eyes.     isosorbide mononitrate 30 MG 24 hr  tablet  Commonly known as:  IMDUR  Take 1 tablet (30 mg total) by mouth daily.     levothyroxine 25 MCG tablet  Commonly known as:  SYNTHROID, LEVOTHROID  Take 1 tablet (25 mcg total) by mouth daily before breakfast.  Start taking on:  04/03/2015     multivitamin with minerals tablet  Take 1 tablet by mouth daily.     ondansetron 4 MG tablet  Commonly known as:  ZOFRAN  Take 1 tablet (4 mg total) by mouth every 8 (eight) hours as needed for nausea or vomiting.     terazosin 5 MG capsule  Commonly known as:  HYTRIN  Take 1 capsule (5 mg total) by mouth at bedtime.        Filed Vitals:   04/02/15 0514 04/02/15 1013  BP: 114/60 108/63  Pulse: 52 58  Temp: 98.1 F (36.7 C) 98 F (36.7 C)  Resp: 16 18    Skin clean, dry and intact without evidence of skin break down, no evidence of skin tears noted. IV catheter discontinued intact. Site without signs and symptoms of complications. Dressing and pressure applied. Pt denies pain at  this time. No complaints noted.  An After Visit Summary was printed and given to the patient. Patient escorted via WC, and D/C home via private auto.  [name] [name] A 04/02/2015 5:08 PM

## 2015-04-02 NOTE — Progress Notes (Signed)
Subjective: Patient is breathing well and has no acute complaints. Says he feels comfortable for discharge today.  Objective: Vital signs in last 24 hours: Filed Vitals:   04/01/15 1654 04/01/15 2038 04/02/15 0514 04/02/15 1013  BP: 100/63 106/62 114/60 108/63  Pulse: 56 55 52 58  Temp: 98.6 F (37 C) 98.7 F (37.1 C) 98.1 F (36.7 C) 98 F (36.7 C)  TempSrc: Oral Oral Oral Oral  Resp: 17 18 16 18   Height:      Weight:  171 lb 12.8 oz (77.928 kg)    SpO2: 98% 96% 100% 98%   Weight change: -4 lb 9.1 oz (-2.072 kg)  Intake/Output Summary (Last 24 hours) at 04/02/15 1426 Last data filed at 04/02/15 1300  Gross per 24 hour  Intake    840 ml  Output   2500 ml  Net  -1660 ml   Physical Exam: General: NAD, laying in bed w/ wife at bedside HEENT: No scleral icterus, no tonsillar erythema or exudate, Moist Mucous Membranes.  Cardiovascular: RRR. No friction rub. No S3 Pulmonary: bibasilar crackles, no wheezing. Abdominal: Soft. NT/ND. Normal bowel sounds Extremities: trace left lower extremity pitting edema. Right leg BKA Neurological: AAOx3 Psychiatric: Normal behavior and affect  Lab Results: Basic Metabolic Panel:  Recent Labs Lab 04/01/15 0459 04/02/15 0720  NA 138 139  K 4.3 4.5  CL 108 107  CO2 22 22  GLUCOSE 105* 99  BUN 105* 101*  CREATININE 4.90* 5.00*  CALCIUM 8.3* 8.5*  PHOS 4.4 4.3   Liver Function Tests:  Recent Labs Lab 04/01/15 0459 04/02/15 0720  ALBUMIN 2.8* 2.9*   CBC:  Recent Labs Lab 03/30/15 0555 03/31/15 0538  WBC 5.1 4.9  HGB 8.5* 8.7*  HCT 26.7* 27.4*  MCV 70.3* 70.3*  PLT 115* 148*   Cardiac Enzymes:  Recent Labs Lab 03/30/15 0757 03/30/15 1510 03/30/15 1910  TROPONINI <0.03 <0.03 0.03   Thyroid Function Tests:  Recent Labs Lab 03/30/15 1510 04/01/15 0459  TSH  --  16.528*  FREET4 0.70  --     Studies/Results: No results found. Medications: I have reviewed the patient's current medications. Scheduled  Meds: . amiodarone  200 mg Oral Daily  . apixaban  5 mg Oral BID  . calcium-vitamin D  1 tablet Oral Q breakfast  . carvedilol  12.5 mg Oral BID WC  . darbepoetin (ARANESP) injection - NON-DIALYSIS  100 mcg Subcutaneous Q Sun-1800  . famotidine  20 mg Oral Daily  . furosemide  80 mg Intravenous BID  . hydrALAZINE  10 mg Oral 3 times per day  . isosorbide mononitrate  30 mg Oral Daily  . [START ON 04/03/2015] levothyroxine  25 mcg Oral QAC breakfast  . multivitamin with minerals  1 tablet Oral Daily  . omega-3 acid ethyl esters  1 g Oral Daily  . terazosin  5 mg Oral QHS   Continuous Infusions:  PRN Meds:.sodium chloride, docusate sodium, fluticasone, ondansetron (ZOFRAN) IV **OR** ondansetron, promethazine **OR** promethazine Assessment/Plan:  Volume Overload 2/2 cardiorenal syndrome-- ECHO yesterday revealed EF of 20-25% which is decreased from EF of 50% in May 2016. Having good diuresis w/ IV lasix 60mg  BID, net -961mL out in last 24. Etiology of change in heart function could have been ischemic or related to his subclinical hypothyroidism. PTH, ANA, Anti-GBM, C4, C3 normal. Cryoglobulin, anti-DS-DNA, and ANCA pending.  - Cardiology following, started imdur 30mg  QD and hydralazine 10mg  TID for afterload reduction.  - TSH elevated and FT4  WNL, thus likely has subclinical hypothyroidism which could be due to amiodarone he is on. TSH elevated again this morning at 16.528,  - Furosemide 60 mg po daily - Daily Weights, Strict I/O's  Hypothyroidism: TSH in 16's with normal FT4. - Synthroid 25 mcg daily  CAD (RCA DES in 2006): T-wave inversions noted in V1-V6 on EKG. Troponins negative. He has been seen by Cardiology, and he is not a candidate for LHC at this time (contrast-induced nephropathy). They would only consider a cath if he had chest pain symptoms consistent with ACS. Medical management at this time.  - Carvedilol 12.5 mg BID   Anemia: Hgb consistently in the 8's.Anemia panel  pending. Nephrology started Aranesp. Anemia panel w/ low iron, normal ferritin, nl TIBC. Likely anemia of chronic disease for CKD and CHF.   Atrial Flutter: Unsuccessful ablation 01/2015. He is not a candidate for flutter ablation as recent EP study showed no inducible/ablatable right sided atrial flutter. - Amiodarone 200 mg daily - Eliquis 5 mg daily - Carvedilol 12.5 mg BID  BPH: Terazosin 10 mg decreased to 5mg  as imdur and hydralazine were added yesterday. BP normal after 24 hrs  DVT Prophylaxis: Eliquis  Dispo: Discharge home today  The patient does have a current PCP Oval Linsey, MD) and does need an [facility] hospital follow-up appointment after discharge.  The patient does not have transportation limitations that hinder transportation to clinic appointments.  .Services Needed at time of discharge: Y = Yes, Blank = No PT:   OT:   RN:   Equipment:   Other:     LOS: 4 days   Liberty Handy, MD 04/02/2015, 2:26 PM

## 2015-04-02 NOTE — Discharge Summary (Signed)
Name: [name] [name] MRN: [id number] DOB: 01/02/1946 70 y.o. PCP: Oval Linsey, MD  Date of Admission: 03/29/2015  4:03 PM Date of Discharge: 04/02/2015 Attending Physician: Aldine Contes, MD  Discharge Diagnosis: 1. Stage V CKD 2. CHF excacerbation  3. CAD 4. Volume Overload secondary to Cardiorenal syndrome   Discharge Medications:   Medication List    TAKE these medications        amiodarone 200 MG tablet  Commonly known as:  PACERONE  Take 1 tablet (200 mg total) by mouth daily.     amLODipine 10 MG tablet  Commonly known as:  NORVASC  Take 1 tablet (10 mg total) by mouth daily.     calcium citrate-vitamin D 315-200 MG-UNIT tablet  Commonly known as:  CITRACAL+D  Take 1 tablet by mouth daily.     carvedilol 12.5 MG tablet  Commonly known as:  COREG  Take 1 tablet (12.5 mg total) by mouth 2 (two) times daily with a meal.     Darbepoetin Alfa 100 MCG/0.5ML Sosy injection  Commonly known as:  ARANESP  Inject 0.5 mLs (100 mcg total) into the skin every Sunday at 6pm.     docusate sodium 100 MG capsule  Commonly known as:  COLACE  Take 100 mg by mouth at bedtime as needed for moderate constipation (constipation).     ELIQUIS 5 MG Tabs tablet  Generic drug:  apixaban  Take 5 mg by mouth 2 (two) times daily.     famotidine 20 MG tablet  Commonly known as:  PEPCID  Take 1 tablet (20 mg total) by mouth 2 (two) times daily.     fish oil-omega-3 fatty acids 1000 MG capsule  Take 1 g by mouth daily.     fluticasone 50 MCG/ACT nasal spray  Commonly known as:  FLONASE  Place 1 spray into both nostrils daily as needed for allergies.     furosemide 20 MG tablet  Commonly known as:  LASIX  Take 3 tablets (60 mg total) by mouth daily.     hydrALAZINE 10 MG tablet  Commonly known as:  APRESOLINE  Take 1 tablet (10 mg total) by mouth every 8 (eight) hours.     hydroxypropyl methylcellulose / hypromellose 2.5 % ophthalmic solution  Commonly known as:  ISOPTO  TEARS / GONIOVISC  Place 2 drops into both eyes 4 (four) times daily as needed for dry eyes.     isosorbide mononitrate 30 MG 24 hr tablet  Commonly known as:  IMDUR  Take 1 tablet (30 mg total) by mouth daily.     levothyroxine 25 MCG tablet  Commonly known as:  SYNTHROID, LEVOTHROID  Take 1 tablet (25 mcg total) by mouth daily before breakfast.  Start taking on:  04/03/2015     multivitamin with minerals tablet  Take 1 tablet by mouth daily.     ondansetron 4 MG tablet  Commonly known as:  ZOFRAN  Take 1 tablet (4 mg total) by mouth every 8 (eight) hours as needed for nausea or vomiting.     terazosin 5 MG capsule  Commonly known as:  HYTRIN  Take 1 capsule (5 mg total) by mouth at bedtime.        Disposition and follow-up:   TyroneTyrone [name] was discharged from [facility] in stable condition.  At the hospital follow up visit please address:  1.  Patient has worsening renal functioning and is now CKD Stage V. He will need ongoing discussions  and education about dialysis.  2.  Assess diuresis needs as an outpatient. He is currently on 60 mg lasix daily.  2.  Labs / imaging needed at time of follow-up: TSH, T4 in 4-6 weeks. BMET, CBC  3.  Pending labs/ test needing follow-up: Cryoglobulin, anti-DS-DNA, and ANCA  Follow-up Appointments: Follow-up Information    Follow up with [facility] On 04/16/2015.   Specialty:  Cardiology   Why:  @2 :00pm for post hospital with Darrol Jumphonda G Barrett, PA-C   Contact information:   [address] [place] [id number] [phone]      Follow up with [facility] In 1 month.   Why:  You should be seen within the month to help come up with a plan for your kidney function. Someone from [facility] will be calling you at home.    Contact information:   [address] [place] [address] [phone]       Follow up with Laban EmperorNick A Taylor, MD On 04/10/2015.   Specialty:   Internal Medicine   Why:  at 1:15   Contact information:   [address] [place] [id number] [phone]       Discharge Instructions: Discharge Instructions    Diet - low sodium heart healthy    Complete by:  As directed      Increase activity slowly    Complete by:  As directed           [name] [name], it was as pleasure taking care of you in the hospital. You will continue to take Lasix 60 mg daily (3 tablets of 20 mg). You have three appointments to go to this month. One is in the [facility] with Dr. Ladona Ridgelaylor for general follow-up. Another is with the heart doctor to address your heart function. Someone from [facility] will also be contacting you to set up an appointment. All of this is listed under "Follow Up" in your discharge paperwork. If you develop weight gain >5 lbs, worsening shortness of breath, or chest pain, please seek immediate medical attention.   Consultations: Treatment Team:  Camille Balynthia Dunham, MD  Procedures Performed:  X-ray Chest Pa And Lateral  03/29/2015  CLINICAL DATA:  CHF exacerbation. EXAM: CHEST  2 VIEW COMPARISON:  02/12/2015 FINDINGS: Lordotic technique is demonstrated. Lungs are hypoinflated and demonstrate small bilateral pleural effusions right greater than left without significant change. Likely associated bibasilar atelectasis. Stable moderate cardiomegaly. Improved perihilar prominence likely mild residual vascular congestion. Remainder of the exam is unchanged. IMPRESSION: Stable cardiomegaly with improved mild vascular congestion. Small bilateral pleural effusions right greater left without significant change. Electronically Signed   By: Elberta Fortisaniel  Boyle M.D.   On: 03/29/2015 17:14   [place] [facility]  03/30/2015  CLINICAL DATA:  Chronic kidney disease. EXAM: RENAL / URINARY TRACT ULTRASOUND COMPLETE COMPARISON:  CT, 12/10/2014 FINDINGS: Right Kidney: Length: 10.0 cm. Increased parenchymal echogenicity. Several cysts. There is a 12 mm upper  pole cyst with adjacent 14 mm cyst. There is a 2.2 cm lateral cyst. No solid masses or stones. No hydronephrosis. Left Kidney: Length: 11.2 cm. Normal overall renal parenchymal echogenicity. 3.4 cm cyst arises from the upper pole. Hypoechoic anechoic the mm lesion lies in the midpole, not well-defined but likely an additional cyst. No other masses. No stones. No hydronephrosis. Bladder: Prostate gland indents the bladder base. Prostate measures 4.7 x 5.9 x 6.0 cm. No bladder mass. No convincing wall thickening. There are bilateral pleural effusions.  Ascites small amount of free fluid is seen adjacent to the bladder in the pelvis. IMPRESSION: 1. No hydronephrosis. 2. Increased parenchymal echogenicity on the right suggests medical renal disease. 3. Bilateral renal cysts. One presumed 8 mm cyst in the left kidney at the midpole is not fully characterized on this exam. Recommend followup ultrasound 4-6 months. 4. Bladder wall thickening noted on the prior CT is not evident on ultrasound. 5. Enlarged prostate gland. 6. Pleural effusions with a small amount of ascites collecting in the posterior pelvis. Electronically Signed   By: Lajean Manes M.D.   On: 03/30/2015 19:36    2D Echo: - Left ventricle: Systolic function was severely reduced. The estimated ejection fraction was in the range of 25% to 30%. Diffuse hypokinesis. There is akinesis of the inferolateral and apical myocardium.  Admission HPI: Mr. Antenucci is a 70 year old man with a PMH of Stage 4 CKD, CHF (EF 50% May 2016) Atrial flutter, HTN, T2DM, CAD (RCA DES in 2006), HCV (untreated, normal RUQ [place]) who presents with shortness of breath. He was joined by his wife who helped relay the history.He reports at least two weeks of worsening orthopnea, paroxysmal nocturnal dyspnea, dyspnea on exertion, and lower extremity edema. The patient has as needed furosemide prescribed for weight gain over 5 pounds, but he has not taken it in the past two days. He  says he has not needed any more pillows than usual, but he's noticed that he's been waking up in the middle of the night more often due to dyspnea. Associated symptoms have been a reduced appetite, generalized weakness, fatigue, and some confusion. He denied any chest pain, dizziness, light-headedness, blackouts, dysgeusia, abdominal pain, nausea, vomiting, diarrhea, vision changes, skin changes, or dysuria. He is a former smoker and no longer drinks. He lives at home with his wife.   He was evaluated in the [facility]. He was SpO2 99% on room air. In the setting of volume overload and his known CKD, a BMET was obtained which revealed a Cr of 4.88 (3.7 in November) and a BUN of 112. A BNP was obtained which was >4500 (3694 on 11/21, 497 on 5/3). In light of his worsening renal function, he was admitted to the IMTS.   Hospital Course by problem list:   Volume Overload 2/2 cardiorenal syndrome: Patient's creatinine remained elevated but stable during the hospitalization the 5's. He diuresed a net of 5.1L during the admission on IV lasix 60 mg BID, and he was transitioned to 60 mg po daily by discharge. His weight on admission was 193 lb which decreased to 171 by discharge. He was evaluated by nephrology, and the patient initially said that he would not undergo dialysis under any circumstances, but reported on day of discharge that he would consider it if it were "life or death." Laboratory studies to investigate reversible causes of worsening renal function were obtained, including PTH, ANA, Anti-GBM, C4, C3, Cryoglobulin, anti-DS-DNA, and ANCA, all of which were either unremarkable or pending. He was also seen by cardiology, who ordered an echo and noted an EF 25-30%, down from an EF 50% from 07/2014. The patient had new TWI in lateral leads, but negative troponins. LHC was deferred in the setting of his chronically worsening renal function. He was started on imdur 30 mg qday and  hydralazine 10 mg TID. He was noted to have a TSH in the 18's with a normal FT4, and he was started on synthroid 25 mcg  daily, given hypothyroidism may have played a role in the reduced EF.   CAD (RCA DES in 2006): T-wave inversions noted in V1-V6 on EKG on day after admission. Troponins were negative. As noted, he  was been seen by Cardiology, and he is not a candidate for LHC at this time (contrast-induced nephropathy). Ischemia could have indeed played a role in his reduced EF He was kept on Carvedilol 12.5 mg BID in addition to his new medications above (imdur, hydralazine).  Anemia: The patient's Hgb was consistently in the 8's. It was deemed it was likely related to his CKAD. He was started on Aranesp.   Atrial Flutter: No atrial flutter noted during admission. He was continued on amiodarone 200 mg daily, Eliquis 5 mg daily, and Carvedilol 12.5 mg BID  BPH: Terazosin 10 mg decreased to 5mg  given that imdur and hydralazine were added by cardiology.   Discharge Vitals:   BP 108/63 mmHg  Pulse 58  Temp(Src) 98 F (36.7 C) (Oral)  Resp 18  Ht 5\' 8"  (1.727 m)  Wt 171 lb 12.8 oz (77.928 kg)  BMI 26.13 kg/m2  SpO2 98%  Discharge Labs:  Results for orders placed or performed during the hospital encounter of 03/29/15 (from the past 24 hour(s))  Renal function panel     Status: Abnormal   Collection Time: 04/02/15  7:20 AM  Result Value Ref Range   Sodium 139 135 - 145 mmol/L   Potassium 4.5 3.5 - 5.1 mmol/L   Chloride 107 101 - 111 mmol/L   CO2 22 22 - 32 mmol/L   Glucose, Bld 99 65 - 99 mg/dL   BUN 101 (H) 6 - 20 mg/dL   Creatinine, Ser 5.00 (H) 0.61 - 1.24 mg/dL   Calcium 8.5 (L) 8.9 - 10.3 mg/dL   Phosphorus 4.3 2.5 - 4.6 mg/dL   Albumin 2.9 (L) 3.5 - 5.0 g/dL   GFR calc non Af Amer 11 (L) >60 mL/min   GFR calc Af Amer 12 (L) >60 mL/min   Anion gap 10 5 - 15    Signed: Liberty Handy, MD 04/02/2015, 5:12 PM

## 2015-04-02 NOTE — Progress Notes (Signed)
[name] Progress Note    Assessment/ Plan:   Background 70 y.o. year-old [place] male with a background of DM2 (no longer on insulin), HTN, HLD, PAD with R AKA, CAD with prior stenting, systolic heart failure (LVEF 50% 07/2014), atrial fib/flutter, chronic hepatitis C (never treated). He was admitted to the hospital with progressive dyspnea, edema, weight gain, volume overload->acutye on chronic heart failure. Creatinine on admission was noted to be 4.88. On chart review, he has had elevated creatinines dating back at least 5 years, and has had dipstick proteinuria since at least 2007. I also note the presence of microscopic hematuria since 11/2014. Prior workup from [facility] records includes a negative SPEP/UPEP in 2008, ultrasound in 2014 that showed 10.5, 11.2 cm kidneys. He is Hep C + with a viral load of 4,720,000 that has never been treated (11/2014). Appears his CKD has been progressive from Stage 3 to Stage 4/5 over the course of 2016 and abruptly worse in the context of his current admission with CHF/volume overoload. He has anemia with Hb of 8.5 and a mild elevation of PTH (107 11/2014).   Impression/Plan  1. Subacute/acute AKI - more rapidly progressive over the past 6 months, longstanding proteinuria (>7 years) which could be compatible with diabetic nephropathy. With the appearance of microscopic hematuria on recent 11/2014 UA raised question of an underlying glomerulonephritis causing the more rapid subacute loss of renal function, which may have been acutely compounded by his heart failure - ECHO findings support contribution cardiorenal component with new reduction in EF to 20-25% and pulm HTN). (Curiously his UA 1/6 did NOT show hematuria or proteinuria - ? 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 Looking for reversible/treatable causes but he is unfortunately at a very advanced stage  already.  - Multiple labs still pending (neg anti-GBM, nl C3/4), benign urine with mild proteinuria PCR of 0.39.  - He initially refused to undergo dialysis but now is willing if it became a matter of life and death. - Fortunately renal function is fairly stable --> certainly will need f/u as outpatient to initiate more conversation about dialysis. Could use a fistula now but may be too traumatic in the acute setting.  2. Acute on chronic systolic HF with newly recognized EF reduction/multiple WMA's. Diuresis --> currently increased to 80 Q12H 3. CAD - h/o PCI/DES to RCA 2006. Multiple new WMA's on ECHO with reduced EF. Not a cath candidate at this time.  4. H/o aflutter - sinus brady now 5. Anemia - TSat quite low, Fe low. Replete with Feraheme (510 dosed 1/7), Aranesp (100 dosed 1/8) 6. DM - previously on insulin. Came off insulin most likely d/t longer insulin t 1/2 with worsening renal failure. 7. HCV with high viral load.  Subjective:   Nausea improved and he feels better; actually wondering when he can go home. Not dyspneic but still edematous --> per pt this is much less as well.   Objective:   BP 108/63 mmHg  Pulse 58  Temp(Src) 98 F (36.7 C) (Oral)  Resp 18  Ht 5\' 8"  (1.727 m)  Wt 77.928 kg (171 lb 12.8 oz)  BMI 26.13 kg/m2  SpO2 98%  Intake/Output Summary (Last 24 hours) at 04/02/15 1045 Last data filed at 04/02/15 0826  Gross per 24 hour  Intake    840 ml  Output   2100 ml  Net  -1260  ml   Weight change: -2.072 kg (-4 lb 9.1 oz)  Physical Exam: Gen: Very nice soft spoken AAM  NAD  Skin: no rash, cyanosis Neck: JVP 5-6 cm Chest: Crackles at bases Heart: Regular, S1S2 no S3 Abdomen: Protuberant, no focal tenderness. + BS, no bruits Ext: Wearing RLE prosthesis. L+ LLE edema pitting Neuro: alert, Ox3, no focal deficit No asterixis  Imaging: No results found.  Labs: BMET  Recent Labs Lab 03/29/15 1042 03/30/15 0555 03/31/15 0538 04/01/15 0459  04/02/15 0720  NA 142 144 143 138 139  K 4.6 4.5 4.7 4.3 4.5  CL 115* 116* 112* 108 107  CO2 19* 19* 21* 22 22  GLUCOSE 106* 93 107* 105* 99  BUN 112* 109* 110* 105* 101*  CREATININE 4.88* 4.84* 5.05* 4.90* 5.00*  CALCIUM 8.5* 8.7* 8.6* 8.3* 8.5*  PHOS  --  5.3* 5.3* 4.4 4.3   CBC  Recent Labs Lab 03/30/15 0555 03/31/15 0538  WBC 5.1 4.9  HGB 8.5* 8.7*  HCT 26.7* 27.4*  MCV 70.3* 70.3*  PLT 115* 148*    Medications:    . amiodarone  200 mg Oral Daily  . apixaban  5 mg Oral BID  . calcium-vitamin D  1 tablet Oral Q breakfast  . carvedilol  12.5 mg Oral BID WC  . darbepoetin (ARANESP) injection - NON-DIALYSIS  100 mcg Subcutaneous Q Sun-1800  . famotidine  20 mg Oral Daily  . furosemide  80 mg Intravenous BID  . hydrALAZINE  10 mg Oral 3 times per day  . isosorbide mononitrate  30 mg Oral Daily  . [START ON 04/03/2015] levothyroxine  25 mcg Oral QAC breakfast  . multivitamin with minerals  1 tablet Oral Daily  . omega-3 acid ethyl esters  1 g Oral Daily  . terazosin  5 mg Oral QHS      Otelia Santee, MD 04/02/2015, 10:45 AM

## 2015-04-02 NOTE — Progress Notes (Signed)
Patient seen and examined. Case d/w residents in detail. I agree with findings and plan as documented in Dr. Marily LenteFord's note.  Patient was admitted with fluid overload and found to have newly depressed EF on ECHO and worsening CKD possibly cardiorenal syndrome. Patient is much improved. Will transition lasix to PO. Cardio recommendations appreciated. C/w hydralazine and imdur. He is not a candidate for LHC given worsening CKD. Will c/w medical management at this time.  Patient is stable for d/c home today. He had initially refused HD but is amenable for thsi if life/death situation per patient discussion with nephro. At this point he does not require HD. Will need close follow up with nephro as outpatient.   Started on synthroid for elevated TSH. Will need repeat TSH and free T4 in 4-6 weeks.

## 2015-04-02 NOTE — Progress Notes (Signed)
Patient Name: [name] [name] Date of Encounter: 04/02/2015   SUBJECTIVE  Feeling well. No chest pain, sob or palpitations.   CURRENT MEDS . amiodarone  200 mg Oral Daily  . apixaban  5 mg Oral BID  . calcium-vitamin D  1 tablet Oral Q breakfast  . carvedilol  12.5 mg Oral BID WC  . darbepoetin (ARANESP) injection - NON-DIALYSIS  100 mcg Subcutaneous Q Sun-1800  . famotidine  20 mg Oral Daily  . furosemide  80 mg Intravenous BID  . hydrALAZINE  10 mg Oral 3 times per day  . isosorbide mononitrate  30 mg Oral Daily  . [START ON 04/03/2015] levothyroxine  25 mcg Oral QAC breakfast  . multivitamin with minerals  1 tablet Oral Daily  . omega-3 acid ethyl esters  1 g Oral Daily  . terazosin  5 mg Oral QHS    OBJECTIVE  Filed Vitals:   04/01/15 1654 04/01/15 2038 04/02/15 0514 04/02/15 1013  BP: 100/63 106/62 114/60 108/63  Pulse: 56 55 52 58  Temp: 98.6 F (37 C) 98.7 F (37.1 C) 98.1 F (36.7 C) 98 F (36.7 C)  TempSrc: Oral Oral Oral Oral  Resp: 17 18 16 18   Height:      Weight:  171 lb 12.8 oz (77.928 kg)    SpO2: 98% 96% 100% 98%    Intake/Output Summary (Last 24 hours) at 04/02/15 1044 Last data filed at 04/02/15 0826  Gross per 24 hour  Intake   1080 ml  Output   2100 ml  Net  -1020 ml   Filed Weights   03/30/15 2112 03/31/15 2057 04/01/15 2038  Weight: 176 lb 12.9 oz (80.2 kg) 176 lb 5.9 oz (80 kg) 171 lb 12.8 oz (77.928 kg)    PHYSICAL EXAM  General: Pleasant, NAD. Neuro: Alert and oriented X 3. Moves all extremities spontaneously. Psych: Normal affect. HEENT:  Normal  Neck: Supple without bruits or JVD. Lungs:  Resp regular and unlabored, faint bibasilar rales.  Heart: RRR no s3, s4, or murmurs. Abdomen: Soft, non-tender, non-distended, BS + x 4.  Extremities: No clubbing, cyanosis or edema. DP 1+ on left. Right BKA.   Accessory Clinical Findings  CBC  Recent Labs  03/31/15 0538  WBC 4.9  HGB 8.7*  HCT 27.4*  MCV 70.3*  PLT 148*    Basic Metabolic Panel  Recent Labs  04/01/15 0459 04/02/15 0720  NA 138 139  K 4.3 4.5  CL 108 107  CO2 22 22  GLUCOSE 105* 99  BUN 105* 101*  CREATININE 4.90* 5.00*  CALCIUM 8.3* 8.5*  PHOS 4.4 4.3   Liver Function Tests  Recent Labs  04/01/15 0459 04/02/15 0720  ALBUMIN 2.8* 2.9*   No results for input(s): LIPASE, AMYLASE in the last 72 hours. Cardiac Enzymes  Recent Labs  03/30/15 1510 03/30/15 1910  TROPONINI <0.03 0.03   Thyroid Function Tests  Recent Labs  04/01/15 0459  TSH 16.528*    TELE  Sinus rhythm with PVCs  Radiology/Studies  X-ray Chest Pa And Lateral  03/29/2015  CLINICAL DATA:  CHF exacerbation. EXAM: CHEST  2 VIEW COMPARISON:  02/12/2015 FINDINGS: Lordotic technique is demonstrated. Lungs are hypoinflated and demonstrate small bilateral pleural effusions right greater than left without significant change. Likely associated bibasilar atelectasis. Stable moderate cardiomegaly. Improved perihilar prominence likely mild residual vascular congestion. Remainder of the exam is unchanged. IMPRESSION: Stable cardiomegaly with improved mild vascular congestion. Small bilateral pleural effusions right greater left without  significant change. Electronically Signed   By: Elberta Fortisaniel  Boyle M.D.   On: 03/29/2015 17:14   [place] Renal  03/30/2015  CLINICAL DATA:  Chronic kidney disease. EXAM: RENAL / URINARY TRACT ULTRASOUND COMPLETE COMPARISON:  CT, 12/10/2014 FINDINGS: Right Kidney: Length: 10.0 cm. Increased parenchymal echogenicity. Several cysts. There is a 12 mm upper pole cyst with adjacent 14 mm cyst. There is a 2.2 cm lateral cyst. No solid masses or stones. No hydronephrosis. Left Kidney: Length: 11.2 cm. Normal overall renal parenchymal echogenicity. 3.4 cm cyst arises from the upper pole. Hypoechoic anechoic the mm lesion lies in the midpole, not well-defined but likely an additional cyst. No other masses. No stones. No hydronephrosis. Bladder: Prostate gland  indents the bladder base. Prostate measures 4.7 x 5.9 x 6.0 cm. No bladder mass. No convincing wall thickening. There are bilateral pleural effusions. Ascites small amount of free fluid is seen adjacent to the bladder in the pelvis. IMPRESSION: 1. No hydronephrosis. 2. Increased parenchymal echogenicity on the right suggests medical renal disease. 3. Bilateral renal cysts. One presumed 8 mm cyst in the left kidney at the midpole is not fully characterized on this exam. Recommend followup ultrasound 4-6 months. 4. Bladder wall thickening noted on the prior CT is not evident on ultrasound. 5. Enlarged prostate gland. 6. Pleural effusions with a small amount of ascites collecting in the posterior pelvis. Electronically Signed   By: Amie Portlandavid  Ormond M.D.   On: 03/30/2015 19:36    ASSESSMENT AND PLAN   1. Acute on Chronic systolic CHF:  -  2D echo showed severe LV dysfunction with EF 20-25% with multiple regional wall motion abnormalities. - Diuresed 0.97L/4.5L. Weight down 5lb. - Continue Coreg. No ACE-ARB due to CKD. Continue nitrate + hydralazine for afterload reduction, if BP allows.  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 With a TSH level of 18, hypothyroidism may also be a potential etiology. Free T3 pending and free T4 is noraml. Continue strict I/Os and low sodium diet.  - Continue medical therapy. Will need nephology clearance if plan for cath in future.   2. Abnormal EKG:  - No anginal pain. He is not on ASA given h/o anemia and need for Eliquis for PAF.  - As above  3. H/o Atrial Flutter:  - Maintaining sinus rhythm with rate in the 50s. He is not a candidate for flutter ablation as recent EP study showed no inducible/ablatable right sided atrial flutter. Continue rhythm control strategy with amiodarone. Continue Eliquis for a/c. His Hgb is at his baseline and stable.   4. Acute on Chronic Kidney Disease: per primary and nephrology.   5. HTN:  Stable. Continue current regimen.   6. Abnormal Thyroid Function Test: Per primary  7. DM: per primary.    Dispo: Will sign off.  Will schedule f/u appointment with Dr. Jens Somrenshaw or APP.     Signed, Bhagat,Bhavinkumar PA-C   Agree with note by Vin Bhagat PA-C  Severe LV dysfunction, systolic CHF responding to IV diuretics and severe RI (SCr 5). Apparently does not want HD. Nothing further to add. Rx with BB (Coreg), hydralazine/nitrate and diuretic. Good diuresis so far and clinically improved. Will follow as OP   Runell GessJonathan J. Karlisha Mathena, M.D., FACP, [facility] CambridgeFACC, Kathryne ErikssonFAHA, FSCAI [facility] [address] [place], [place]  [id number]  [phone] 04/02/2015 12:03 PM

## 2015-04-02 NOTE — Care Management Important Message (Signed)
Important Message  Patient Details  Name: [name] [name] MRN: [id number] Date of Birth: 09/16/1945   Medicare Important Message Given:  Yes    [name], [name] 04/02/2015, 2:02 PMImportant Message  Patient Details  Name: [name] [name] MRN: [id number] Date of Birth: 04/16/1945   Medicare Important Message Given:  Yes    [name] 04/02/2015, 2:02 PM

## 2015-04-03 ENCOUNTER — Telehealth: Payer: Self-pay | Admitting: Internal Medicine

## 2015-04-03 LAB — ANTI-DNA ANTIBODY, DOUBLE-STRANDED: ds DNA Ab: 2 IU/mL (ref 0–9)

## 2015-04-03 MED FILL — Perflutren Lipid Microsphere IV Susp 1.1 MG/ML: INTRAVENOUS | Qty: 10 | Status: AC

## 2015-04-03 NOTE — Telephone Encounter (Signed)
Pt wife states darbepoetin Alfa is not covered through the insurance. Please call pt back.

## 2015-04-04 ENCOUNTER — Other Ambulatory Visit: Payer: Self-pay | Admitting: Pharmacist

## 2015-04-05 LAB — CRYOGLOBULIN

## 2015-04-05 NOTE — Patient Outreach (Signed)
[facility] Care Management  [facility]   04/05/2015  [name] GuWilliam R [name] 11/30/1945 [id number]  Subjective: [name] LappingWilliam [name] is a 70yo with a recent hospitalization for acute on chronic heart failure (EF 25-30% on 04/01/15).  I identified patient as eligible for pharmacy transition of care project.  Medication review completed.  I called patient and read consent.  Patient agreed to participate in transition of care project.  Patient reports his wife manages his medications and gave permission for me to speak to his wife [name] [name].  I reviewed all medications with the patient's wife.    Patient denies weighing daily as instructed but reports she is going to get a scale today.  I counseled patient's wife on importance of patient weighing daily.  Patient denies shortness or breath or swelling.    Patient has hospital follow up scheduled for 04/10/15 with his primary care provider and for 04/16/15 with his cardiologist.    Objective:   Current Medications: Current Outpatient Prescriptions  Medication Sig Dispense Refill  . amiodarone (PACERONE) 200 MG tablet Take 1 tablet (200 mg total) by mouth daily. 90 tablet 3  . amLODipine (NORVASC) 10 MG tablet Take 1 tablet (10 mg total) by mouth daily. 90 tablet 3  . calcium citrate-vitamin D (CITRACAL+D) 315-200 MG-UNIT per tablet Take 1 tablet by mouth daily.     . carvedilol (COREG) 12.5 MG tablet Take 1 tablet (12.5 mg total) by mouth 2 (two) times daily with a meal. 180 tablet 3  . docusate sodium (COLACE) 100 MG capsule Take 100 mg by mouth at bedtime as needed for moderate constipation (constipation).     [name]. ELIQUIS 5 MG TABS tablet Take 5 mg by mouth 2 (two) times daily.    . famotidine (PEPCID) 20 MG tablet Take 1 tablet (20 mg total) by mouth 2 (two) times daily. (Patient taking differently: Take 20 mg by mouth 2 (two) times daily as needed for heartburn. ) 60 tablet 0  . fish oil-omega-3 fatty acids 1000 MG capsule Take 1 g  by mouth daily.    . fluticasone (FLONASE) 50 MCG/ACT nasal spray Place 1 spray into both nostrils daily as needed for allergies. 16 g 3  . furosemide (LASIX) 20 MG tablet Take 3 tablets (60 mg total) by mouth daily. 30 tablet 0  . hydrALAZINE (APRESOLINE) 10 MG tablet Take 1 tablet (10 mg total) by mouth every 8 (eight) hours. 90 tablet 1  . hydroxypropyl methylcellulose (ISOPTO TEARS) 2.5 % ophthalmic solution Place 2 drops into both eyes 4 (four) times daily as needed for dry eyes.    . isosorbide mononitrate (IMDUR) 30 MG 24 hr tablet Take 1 tablet (30 mg total) by mouth daily. 30 tablet 1  . levothyroxine (SYNTHROID, LEVOTHROID) 25 MCG tablet Take 1 tablet (25 mcg total) by mouth daily before breakfast. 30 tablet 1  . Multiple Vitamins-Minerals (MULTIVITAMIN WITH MINERALS) tablet Take 1 tablet by mouth daily.    . ondansetron (ZOFRAN) 4 MG tablet Take 1 tablet (4 mg total) by mouth every 8 (eight) hours as needed for nausea or vomiting. 20 tablet 0  . terazosin (HYTRIN) 5 MG capsule Take 1 capsule (5 mg total) by mouth at bedtime. 30 capsule 1  . Darbepoetin Alfa (ARANESP) 100 MCG/0.5ML SOSY injection Inject 0.5 mLs (100 mcg total) into the skin every Sunday at 6pm. (Patient not taking: Reported on 04/04/2015) 4.2 mL 6   No current facility-administered medications for this visit.   Functional Status:  In your present state of health, do you have any difficulty performing the following activities: 03/29/2015 03/29/2015  Hearing? N -  Vision? N -  Difficulty concentrating or making decisions? N -  Walking or climbing stairs? N -  Dressing or bathing? N -  Doing errands, shopping? N N   Fall/Depression Screening: PHQ 2/9 Scores 03/29/2015 12/20/2014 10/20/2014 09/05/2014 08/22/2014 08/02/2014 05/26/2014  PHQ - 2 Score 0 0 0 0 0 1 0  PHQ- 9 Score - - - - - - -    Assessment: 1.  Medication review:   Drugs sorted by system:  Neurologic/Psychologic: none  Cardiovascular: amiodarone, amlodipine,  apixaban, carvedilol, furosemide, hydralazine, isosorbide mononitrate, omega-3 fatty acids, terazosin  Pulmonary/Allergy: fluticasone  Gastrointestinal: docusate, famotidine, ondansetron  Endocrine: levothyroxine  Renal: darbepoetin alfa  Topical: hypromellose solution  Pain: none  Vitamins/Minerals: calcium-vitamin D, multivitamin  Infectious Diseases: none  Miscellaneous: none   Duplications in therapy: none noted Gaps in therapy:  - Statin for CAD (RCA DES in 2006) - ACEI or ARB for LVSD - noted patient's allergy list includes acute kidney injury due to lisinopril so will not recommend use  Medications to avoid in the elderly: terazosin (high risk of orthostatic hypotension) Drug interactions: levothyroxine and calcium (calcium may decrease pharmacologic effects of levothyroxine) - patient reports taking both levothyroxine and calcium in the morning - counseled patient to separate levothyroxine and calcium and to start taking calcium in the evening  Other issues noted:   Patient is prescribed famotidine 20mg  BID.  Based on patient's renal function, renal dose adjustment is indicated.   2.  Medication adherence:  Patient's wife reports patient is adherent with medications except Aranesp.  She reports they have been unable to pick up patient's prescription for Aranesp as it was not covered by patient's insurance.    Plan: 1. Medication review:  - Statin therapy is indicated for patient with history of CAD with RCA DES in 2006.  Please consider initiation of high intensity statin therapy.   - Patient is on terazosin which has a high risk of orthostatic hypotension.  Please use caution with medication on the beers list.   - Patient is prescribed famotidine 20mg  BID.  Based on patient's renal function, renal dose adjustment is indicated.  Please consider reducing dose to famotidine 20mg  daily.   Will send a fax to patient's primary care provider with this information.   2.   Counseled patient to continue to take medications as prescribed.   3.  I called [facility] regarding Aranesp prescription.  Spoke to pharmacist Larina Earthly who reports prescription needs prior authorization.  Spoke to patient's primary care office who report they have received request for prior authorization and are working on it.  Will follow up with patient within one week to check on status of prescription.     Lilla Shook, Pharm.D. Pharmacy Resident [facility] [phone]

## 2015-04-10 ENCOUNTER — Encounter: Payer: Self-pay | Admitting: Internal Medicine

## 2015-04-10 ENCOUNTER — Other Ambulatory Visit: Payer: Self-pay | Admitting: Pharmacist

## 2015-04-10 ENCOUNTER — Ambulatory Visit (INDEPENDENT_AMBULATORY_CARE_PROVIDER_SITE_OTHER): Payer: Medicare Other | Admitting: Internal Medicine

## 2015-04-10 VITALS — BP 126/62 | HR 56 | Temp 98.2°F | Ht 68.0 in | Wt 181.9 lb

## 2015-04-10 DIAGNOSIS — N184 Chronic kidney disease, stage 4 (severe): Secondary | ICD-10-CM | POA: Diagnosis not present

## 2015-04-10 DIAGNOSIS — L603 Nail dystrophy: Secondary | ICD-10-CM | POA: Diagnosis not present

## 2015-04-10 DIAGNOSIS — D631 Anemia in chronic kidney disease: Secondary | ICD-10-CM

## 2015-04-10 DIAGNOSIS — I5022 Chronic systolic (congestive) heart failure: Secondary | ICD-10-CM

## 2015-04-10 DIAGNOSIS — K219 Gastro-esophageal reflux disease without esophagitis: Secondary | ICD-10-CM

## 2015-04-10 DIAGNOSIS — I5020 Unspecified systolic (congestive) heart failure: Secondary | ICD-10-CM | POA: Insufficient documentation

## 2015-04-10 MED ORDER — FAMOTIDINE 20 MG PO TABS: 20.0000 mg | ORAL_TABLET | Freq: Every day | ORAL | Status: DC

## 2015-04-10 MED ORDER — FUROSEMIDE 20 MG PO TABS: 60.0000 mg | ORAL_TABLET | Freq: Every day | ORAL | Status: DC

## 2015-04-10 NOTE — Assessment & Plan Note (Signed)
He also caught his left 1st toenail on the bedspread yesterday, causing a split in the nail.  He denies fever, chills, swelling, erythema, or drainage from the wound.  They have been applying Neosporin to the nail.  His DMII is under good control.  He does not have peripheral neuropathy.  He has previously been seen by [facility].  On exam, patient with longitudinal split of left 1st toenail.  No pus, swelling, or erythema.  A/P: Longitudinal split.  No signs of infection.   - Bacitracin ointment - Referral to Podiatry for eval and possible nail removal.

## 2015-04-10 NOTE — Assessment & Plan Note (Signed)
Patient was discharged from hospital for HFrEF exacerbation, with EF found to be 25-30%.  He was discharged with dry weight of 171 lb on Lasix 60 mg PO daily.  Since discharge, he reports he is doing well.  He denies CP, SOB, orthopnea, PND, lightheadedness, dizziness, or leg swelling.  His weight at home this morning was 170 lb.  In the office, his weight was 182 (fully clothed, with leg prosthesis).   He has not been receiving his Aranesp due to insurance coverage for at home administration.  A/P: HFrEF, stable.  Patient doing well on Lasix 60 mg daily.  Weight stable.  Patient counseled on importance of low salt diet and daily weights.  Will continue Lasix 60 mg and check BMP - Lasix 60 mg PO daily - BMP - Daily weights.

## 2015-04-10 NOTE — Progress Notes (Signed)
Patient ID: [name] [name], male   DOB: 05/26/1945, 70 y.o.   MRN: [id number]    Subjective:   Patient ID: [name] [name] male   DOB: 04-Jan-1946 70 y.o.   MRN: [id number]  HPI: [name] is a 70 y.o. male with PMH as below, here for hospital f/u and medication reconciliation.  Please see Problem-Based charting for the status of the patient's chronic medical issues.  Patient was discharged from hospital for HFrEF exacerbation, with EF found to be 25-30%.  He was discharged with dry weight of 171 lb on Lasix 60 mg PO daily.  Since discharge, he reports he is doing well.  He denies CP, SOB, orthopnea, PND, lightheadedness, dizziness, or leg swelling.  His weight at home this morning was 170 lb.  In the office, his weight was 182 (fully clothed, with leg prosthesis).   He has not been receiving his Aranesp due to insurance coverage for at home administration.  He also caught his left 1st toenail on the bedspread yesterday, causing a split in the nail.  He denies fever, chills, swelling, erythema, or drainage from the wound.  They have been applying Neosporin to the nail.  His DMII is under good control.  He does not have peripheral neuropathy.  He has previously been seen by [facility].   Past Medical History  Diagnosis Date  . Hyperlipidemia 04/02/2006  . Essential hypertension 12/09/2006  . Benign prostatic hypertrophy with nocturia 07/07/2006  . Chronic venous insufficiency 04/12/2010  . Obesity (BMI 30.0-34.9) 01/15/2012  . Constipation 05/25/2009    Intermittent   . Microcytic normochromic anemia 05/27/2006  . Internal and external hemorrhoids without complication 08/20/2012  . Coronary artery disease 04/02/2006    s/p RCA DES 2006, low risk myoview in 2011  . 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 Type 2 diabetes mellitus with peripheral artery disease (HCC) 05/27/2006    Absent pulses in the left foot   . Closed displaced fracture of left femoral neck (HCC) 06/14/2014    s/p left hip hemiarthroplasty June 14, 2014   . Atrial flutter (HCC) 07/26/2014    a. May 2016, CHA2DS2VASc = 4 -> Eliquis, spontaneous conversion to NSR;  b. On amio;  c. 01/2015 EPS: Unable to induce right sided Aflutter. Non-sustained Afib and Left sided Aflutter noted.  [name] GERD (gastroesophageal reflux disease)   . Type 2 diabetes mellitus with stage 4 chronic kidney disease (HCC) 08/06/2012  . History of blood transfusion 05/2014    "related to hip OR"  . Degenerative joint disease involving multiple joints 02/02/2007  . Arthritis     "fingers" (02/07/2015)   Current Outpatient Prescriptions  Medication Sig Dispense Refill  . amiodarone (PACERONE) 200 MG tablet Take 1 tablet (200 mg total) by mouth daily. 90 tablet 3  . amLODipine (NORVASC) 10 MG tablet Take 1 tablet (10 mg total) by mouth daily. 90 tablet 3  . calcium citrate-vitamin D (CITRACAL+D) 315-200 MG-UNIT per tablet Take 1 tablet by mouth daily.     . carvedilol (COREG) 12.5 MG tablet Take 1 tablet (12.5 mg total) by mouth 2 (two) times daily with a meal. 180 tablet 3  . docusate sodium (COLACE) 100 MG capsule Take 100 mg by mouth at  bedtime as needed for moderate constipation (constipation).     [name] ELIQUIS 5 MG TABS tablet Take 5 mg by mouth 2 (two) times daily.    . famotidine (PEPCID) 20 MG tablet Take 1 tablet (20 mg total) by mouth daily. 30 tablet 3  . fish oil-omega-3 fatty acids 1000 MG capsule Take 1 g by mouth daily.    . fluticasone (FLONASE) 50 MCG/ACT nasal spray Place 1 spray into both nostrils daily as needed for allergies. 16 g 3  . furosemide (LASIX) 20 MG tablet Take 3 tablets (60 mg total) by mouth daily. 90 tablet 3  . hydrALAZINE (APRESOLINE) 10 MG tablet Take 1 tablet (10 mg  total) by mouth every 8 (eight) hours. 90 tablet 1  . hydroxypropyl methylcellulose (ISOPTO TEARS) 2.5 % ophthalmic solution Place 2 drops into both eyes 4 (four) times daily as needed for dry eyes.    . isosorbide mononitrate (IMDUR) 30 MG 24 hr tablet Take 1 tablet (30 mg total) by mouth daily. 30 tablet 1  . levothyroxine (SYNTHROID, LEVOTHROID) 25 MCG tablet Take 1 tablet (25 mcg total) by mouth daily before breakfast. 30 tablet 1  . Multiple Vitamins-Minerals (MULTIVITAMIN WITH MINERALS) tablet Take 1 tablet by mouth daily.    . ondansetron (ZOFRAN) 4 MG tablet Take 1 tablet (4 mg total) by mouth every 8 (eight) hours as needed for nausea or vomiting. 20 tablet 0  . terazosin (HYTRIN) 5 MG capsule Take 1 capsule (5 mg total) by mouth at bedtime. 30 capsule 1   No current facility-administered medications for this visit.   Family History  Problem Relation Age of Onset  . Hypertension Mother   . Heart attack Father   . Breast cancer Sister   . Arthritis Sister   . Heart attack Brother   . Diabetes Brother   . Stroke Brother   . Pneumonia Daughter   . Diabetes Brother   . Alcoholism Brother   . Diabetes Brother   . Arthritis Sister     Bilateral knee replacement  . HIV Daughter   . Hypertension Daughter   . Drug abuse Daughter   . Schizophrenia Daughter   . Hypertension Daughter   . Drug abuse Daughter   . Bipolar disorder Daughter    Social History   Social History  . Marital Status: Married    Spouse Name: N/A  . Number of Children: N/A  . Years of Education: N/A   Social History Main Topics  . Smoking status: Former Smoker -- 1.00 packs/day for 10 years    Types: Cigarettes    Quit date: 06/16/1968  . Smokeless tobacco: Never Used  . Alcohol Use: No     Comment: "quit alcohol in the 1960's"  . Drug Use: No  . Sexual Activity: Not Currently    Birth Control/ Protection: None   Other Topics Concern  . None   Social History Narrative   Review of  Systems: Pertinent items are noted in HPI. Balance of 10 point ROS negative. Objective:  Physical Exam: Filed Vitals:   04/10/15 1430  BP: 126/62  Pulse: 56  Temp: 98.2 F (36.8 C)  TempSrc: Oral  Height:  (1.727 m)  Weight: 181 lb 14.4 oz (82.509 kg)  SpO2: 100%   Physical Exam  Constitutional: He is oriented to person, place, and time.  Elderly male, sitting in wheelchair, NAD  HENT:  Head: Normocephalic and atraumatic.  Eyes: EOM are normal. No scleral icterus.  Neck: No  JVD present.  Cardiovascular: Normal rate, regular rhythm and normal heart sounds.   Left DP and PT pulses not appreciated.  Pulmonary/Chest: Effort normal. No respiratory distress. He has no wheezes.  Minimal crackles in bilateral bases.  Abdominal: Soft. He exhibits no distension. There is no tenderness.  Musculoskeletal: He exhibits no edema.  Neurological: He is alert and oriented to person, place, and time.  Skin: Skin is warm and dry. No rash noted.  Longitudinal split of left 1st toenail.  No pus, swelling, or erythema.     Assessment & Plan:   Patient and case were discussed with Dr. Josem Kaufmann.  Please refer to Problem Based charting for further documentation.

## 2015-04-10 NOTE — Patient Instructions (Signed)
1. Ask for Bacitracin at pharmacy for nail. Continue to cover with dry, loose, gauze. 2. Follow up with Podiatry for evaluation of nail.  3. Continue Lasix (Furosemide) 60 mg (3 tabs) every day. 4. Weight yourself every morning in the same clothes and prosthesis.  If your weight becomes over 175 lbs, call clinic. 5. Continue low salt diet.  Heart Failure Heart failure is a condition in which the heart has trouble pumping blood. This means your heart does not pump blood efficiently for your body to work well. In some cases of heart failure, fluid may back up into your lungs or you may have swelling (edema) in your lower legs. Heart failure is usually a long-term (chronic) condition. It is important for you to take good care of yourself and follow your health care provider's treatment plan. CAUSES  Some health conditions can cause heart failure. Those health conditions include:  High blood pressure (hypertension). Hypertension causes the heart muscle to work harder than normal. When pressure in the blood vessels is high, the heart needs to pump (contract) with more force in order to circulate blood throughout the body. High blood pressure eventually causes the heart to become stiff and weak.  Coronary artery disease (CAD). CAD is the buildup of cholesterol and fat (plaque) in the arteries of the heart. The blockage in the arteries deprives the heart muscle of oxygen and blood. This can cause chest pain and may lead to a heart attack. High blood pressure can also contribute to CAD.  Heart attack (myocardial infarction). A heart attack occurs when one or more arteries in the heart become blocked. The loss of oxygen damages the muscle tissue of the heart. When this happens, part of the heart muscle dies. The injured tissue does not contract as well and weakens the heart's ability to pump blood.  Abnormal heart valves. When the heart valves do not open and close properly, it can cause heart failure. This  makes the heart muscle pump harder to keep the blood flowing.  Heart muscle disease (cardiomyopathy or myocarditis). Heart muscle disease is damage to the heart muscle from a variety of causes. These can include drug or alcohol abuse, infections, or unknown reasons. These can increase the risk of heart failure.  Lung disease. Lung disease makes the heart work harder because the lungs do not work properly. This can cause a strain on the heart, leading it to fail.  Diabetes. Diabetes increases the risk of heart failure. High blood sugar contributes to high fat (lipid) levels in the blood. Diabetes can also cause slow damage to tiny blood vessels that carry important nutrients to the heart muscle. When the heart does not get enough oxygen and food, it can cause the heart to become weak and stiff. This leads to a heart that does not contract efficiently.  Other conditions can contribute to heart failure. These include abnormal heart rhythms, thyroid problems, and low blood counts (anemia). Certain unhealthy behaviors can increase the risk of heart failure, including:  Being overweight.  Smoking or chewing tobacco.  Eating foods high in fat and cholesterol.  Abusing illicit drugs or alcohol.  Lacking physical activity. SYMPTOMS  Heart failure symptoms may vary and can be hard to detect. Symptoms may include:  Shortness of breath with activity, such as climbing stairs.  Persistent cough.  Swelling of the feet, ankles, legs, or abdomen.  Unexplained weight gain.  Difficulty breathing when lying flat (orthopnea).  Waking from sleep because of the need to  sit up and get more air.  Rapid heartbeat.  Fatigue and loss of energy.  Feeling light-headed, dizzy, or close to fainting.  Loss of appetite.  Nausea.  Increased urination during the night (nocturia). DIAGNOSIS  A diagnosis of heart failure is based on your history, symptoms, physical examination, and diagnostic tests.  Diagnostic tests for heart failure may include:  Echocardiography.  Electrocardiography.  Chest X-ray.  Blood tests.  Exercise stress test.  Cardiac angiography.  Radionuclide scans. TREATMENT  Treatment is aimed at managing the symptoms of heart failure. Medicines, behavioral changes, or surgical intervention may be necessary to treat heart failure.  Medicines to help treat heart failure may include:  Angiotensin-converting enzyme (ACE) inhibitors. This type of medicine blocks the effects of a blood protein called angiotensin-converting enzyme. ACE inhibitors relax (dilate) the blood vessels and help lower blood pressure.  Angiotensin receptor blockers (ARBs). This type of medicine blocks the actions of a blood protein called angiotensin. Angiotensin receptor blockers dilate the blood vessels and help lower blood pressure.  Water pills (diuretics). Diuretics cause the kidneys to remove salt and water from the blood. The extra fluid is removed through urination. This loss of extra fluid lowers the volume of blood the heart pumps.  Beta blockers. These prevent the heart from beating too fast and improve heart muscle strength.  Digitalis. This increases the force of the heartbeat.  Healthy behavior changes include:  Obtaining and maintaining a healthy weight.  Stopping smoking or chewing tobacco.  Eating heart-healthy foods.  Limiting or avoiding alcohol.  Stopping illicit drug use.  Physical activity as directed by your health care provider.  Surgical treatment for heart failure may include:  A procedure to open blocked arteries, repair damaged heart valves, or remove damaged heart muscle tissue.  A pacemaker to improve heart muscle function and control certain abnormal heart rhythms.  An internal cardioverter defibrillator to treat certain serious abnormal heart rhythms.  A left ventricular assist device (LVAD) to assist the pumping ability of the heart. HOME CARE  INSTRUCTIONS   Take medicines only as directed by your health care provider. Medicines are important in reducing the workload of your heart, slowing the progression of heart failure, and improving your symptoms.  Do not stop taking your medicine unless directed by your health care provider.  Do not skip any dose of medicine.  Refill your prescriptions before you run out of medicine. Your medicines are needed every day.  Engage in moderate physical activity if directed by your health care provider. Moderate physical activity can benefit some people. The elderly and people with severe heart failure should consult with a health care provider for physical activity recommendations.  Eat heart-healthy foods. Food choices should be free of trans fat and low in saturated fat, cholesterol, and salt (sodium). Healthy choices include fresh or frozen fruits and vegetables, fish, lean meats, legumes, fat-free or low-fat dairy products, and whole grain or high fiber foods. Talk to a dietitian to learn more about heart-healthy foods.  Limit sodium if directed by your health care provider. Sodium restriction may reduce symptoms of heart failure in some people. Talk to a dietitian to learn more about heart-healthy seasonings.  Use healthy cooking methods. Healthy cooking methods include roasting, grilling, broiling, baking, poaching, steaming, or stir-frying. Talk to a dietitian to learn more about healthy cooking methods.  Limit fluids if directed by your health care provider. Fluid restriction may reduce symptoms of heart failure in some people.  Weigh yourself every day.  Daily weights are important in the early recognition of excess fluid. You should weigh yourself every morning after you urinate and before you eat breakfast. Wear the same amount of clothing each time you weigh yourself. Record your daily weight. Provide your health care provider with your weight record.  Monitor and record your blood  pressure if directed by your health care provider.  Check your pulse if directed by your health care provider.  Lose weight if directed by your health care provider. Weight loss may reduce symptoms of heart failure in some people.  Stop smoking or chewing tobacco. Nicotine makes your heart work harder by causing your blood vessels to constrict. Do not use nicotine gum or patches before talking to your health care provider.  Keep all follow-up visits as directed by your health care provider. This is important.  Limit alcohol intake to no more than 1 drink per day for nonpregnant women and 2 drinks per day for men. One drink equals 12 ounces of beer, 5 ounces of wine, or 1 ounces of hard liquor. Drinking more than that is harmful to your heart. Tell your health care provider if you drink alcohol several times a week. Talk with your health care provider about whether alcohol is safe for you. If your heart has already been damaged by alcohol or you have severe heart failure, drinking alcohol should be stopped completely.  Stop illicit drug use.  Stay up-to-date with immunizations. It is especially important to prevent respiratory infections through current pneumococcal and influenza immunizations.  Manage other health conditions such as hypertension, diabetes, thyroid disease, or abnormal heart rhythms as directed by your health care provider.  Learn to manage stress.  Plan rest periods when fatigued.  Learn strategies to manage high temperatures. If the weather is extremely hot:  Avoid vigorous physical activity.  Use air conditioning or fans or seek a cooler location.  Avoid caffeine and alcohol.  Wear loose-fitting, lightweight, and light-colored clothing.  Learn strategies to manage cold temperatures. If the weather is extremely cold:  Avoid vigorous physical activity.  Layer clothes.  Wear mittens or gloves, a hat, and a scarf when going outside.  Avoid alcohol.  Obtain  ongoing education and support as needed.  Participate in or seek rehabilitation as needed to maintain or improve independence and quality of life. SEEK MEDICAL CARE IF:   You have a rapid weight gain.  You have increasing shortness of breath that is unusual for you.  You are unable to participate in your usual physical activities.  You tire easily.  You cough more than normal, especially with physical activity.  You have any or more swelling in areas such as your hands, feet, ankles, or abdomen.  You are unable to sleep because it is hard to breathe.  You feel like your heart is beating fast (palpitations).  You become dizzy or light-headed upon standing up. SEEK IMMEDIATE MEDICAL CARE IF:   You have difficulty breathing.  There is a change in mental status such as decreased alertness or difficulty with concentration.  You have a pain or discomfort in your chest.  You have an episode of fainting (syncope). MAKE SURE YOU:   Understand these instructions.  Will watch your condition.  Will get help right away if you are not doing well or get worse.   This information is not intended to replace advice given to you by your health care provider. Make sure you discuss any questions you have with your health care  provider.   Document Released: 03/10/2005 Document Revised: 07/25/2014 Document Reviewed: 04/09/2012 Elsevier Interactive Patient Education [employer].

## 2015-04-10 NOTE — Assessment & Plan Note (Signed)
Patient has not received Aranesp 2/2 insurance coverage.    A/P: AoCD 2/2 CKD.  - Short stay infusion orders for Aranesp 100 mcg weekly.

## 2015-04-10 NOTE — Patient Outreach (Signed)
[facility]) Care Management  04/10/2015  KOLTON KIENLE Jan 17, 1946 [id number]   Care coordination:  Zeno Hickel is a 69yo with a recent hospitalization for acute on chronic heart failure (EF 25-30% on 04/01/15).  I completed medication review on 04/04/15 and identified that patient was unable to pick up Aranesp prescription as it was not covered by insurance and required a prior authorization.  I made outreach call today to patient's wife, [name], who reports they still have not been able to pick up Aranesp from the pharmacy.  Made outreach call to patient's pharmacy who confirms that insurance claim is still rejecting.  Patient has primary care visit today at 2:15 PM.  Will send inbasket message to primary care office regarding prior authorization for Aranesp.     Lilla Shook, Pharm.D. Pharmacy Resident [facility] [phone]

## 2015-04-10 NOTE — Progress Notes (Signed)
I saw and evaluated the patient.  I personally confirmed the key portions of Dr. Taylor's history and exam and reviewed pertinent patient test results.  The assessment, diagnosis, and plan were formulated together and I agree with the documentation in the resident's note. 

## 2015-04-11 ENCOUNTER — Other Ambulatory Visit: Payer: Self-pay | Admitting: Pharmacist

## 2015-04-11 LAB — BMP8+ANION GAP
Anion Gap: 18 mmol/L (ref 10.0–18.0)
BUN/Creatinine Ratio: 19 (ref 10–22)
BUN: 84 mg/dL — AB (ref 8–27)
CALCIUM: 9.7 mg/dL (ref 8.6–10.2)
CO2: 18 mmol/L (ref 18–29)
CREATININE: 4.46 mg/dL — AB (ref 0.76–1.27)
Chloride: 103 mmol/L (ref 96–106)
GFR, EST AFRICAN AMERICAN: 14 mL/min/{1.73_m2} — AB (ref >59)
GFR, EST NON AFRICAN AMERICAN: 12 mL/min/{1.73_m2} — AB (ref >59)
Glucose: 160 mg/dL — ABNORMAL HIGH (ref 65–99)
Potassium: 4.4 mmol/L (ref 3.5–5.2)
Sodium: 139 mmol/L (ref 134–144)

## 2015-04-11 NOTE — Patient Outreach (Signed)
[facility]) Care Management  04/11/2015  ELIOR ROBINETTE Dec 04, 1945 [id number]   Care coordination:  Jobin Montelongo is a 69yo with a recent hospitalization for acute on chronic heart failure (EF 25-30% on 04/01/15). I completed medication review on 04/04/15 and identified that patient was unable to pick up Aranesp prescription as it was not covered by insurance and required a prior authorization. I received an inbasket message from patient's primary care office regarding Aranesp prescription.  Patient's insurance will not pay for Aranesp to be administered at home.  Therefore, orders have been placed for Short-Stay Unit administration of weekly Aranesp.   I made outreach call today to patient's wife, [name], who confirms that issue has been resolved and patient will start going to clinic weekly for Aranesp injections.  Mrs. Whitmill denies any further pharmacy needs at this time.  Will close pharmacy program.  I provided patient with my phone number and encouraged them to call me should they have any further questions or concerns related to patient's medications.    Lilla Shook, Pharm.D. Pharmacy Resident [facility] [phone]

## 2015-04-13 ENCOUNTER — Encounter (HOSPITAL_COMMUNITY)
Admission: RE | Admit: 2015-04-13 | Discharge: 2015-04-13 | Disposition: A | Discharge status: Home / Self Care | Payer: Medicare Other | Source: Ambulatory Visit | Attending: Internal Medicine | Admitting: Internal Medicine

## 2015-04-13 DIAGNOSIS — N189 Chronic kidney disease, unspecified: Secondary | ICD-10-CM | POA: Diagnosis present

## 2015-04-13 DIAGNOSIS — Z79899 Other long term (current) drug therapy: Secondary | ICD-10-CM | POA: Insufficient documentation

## 2015-04-13 DIAGNOSIS — Z5181 Encounter for therapeutic drug level monitoring: Secondary | ICD-10-CM | POA: Insufficient documentation

## 2015-04-13 DIAGNOSIS — D631 Anemia in chronic kidney disease: Secondary | ICD-10-CM | POA: Diagnosis not present

## 2015-04-13 LAB — POCT HEMOGLOBIN-HEMACUE: HEMOGLOBIN: 9.5 g/dL — AB (ref 13.0–17.0)

## 2015-04-13 MED ORDER — DARBEPOETIN ALFA 100 MCG/0.5ML IJ SOSY
PREFILLED_SYRINGE | INTRAMUSCULAR | Status: AC
  Filled 2015-04-13: qty 0.5

## 2015-04-13 MED ORDER — DARBEPOETIN ALFA 100 MCG/0.5ML IJ SOSY: 100.0000 ug | PREFILLED_SYRINGE | INTRAMUSCULAR | Status: DC

## 2015-04-13 MED ORDER — DARBEPOETIN ALFA 100 MCG/0.5ML IJ SOSY
100.0000 ug | PREFILLED_SYRINGE | Freq: Once | INTRAMUSCULAR | Status: AC
  Administered 2015-04-13: 100 ug via SUBCUTANEOUS

## 2015-04-13 NOTE — Progress Notes (Signed)
Spoke with Lynn Ito, CMA regarding need for hemglobin and parameter orders. Orders received per Dr. Ladona Ridgel. Discussed that orders can be put in via therapy plan if md wants to do that. Will speak with Dr. Ladona Ridgel and send new orders prior to next appointment.

## 2015-04-13 NOTE — Discharge Instructions (Signed)
Darbepoetin Alfa injection What is this medicine? DARBEPOETIN ALFA (dar be POE e tin AL fa) helps your body make more red blood cells. It is used to treat anemia caused by chronic kidney failure and chemotherapy. This medicine may be used for other purposes; ask your health care provider or pharmacist if you have questions. What should I tell my health care provider before I take this medicine? They need to know if you have any of these conditions: -blood clotting disorders or history of blood clots -cancer patient not on chemotherapy -cystic fibrosis -heart disease, such as angina, heart failure, or a history of a heart attack -hemoglobin level of 12 g/dL or greater -high blood pressure -low levels of folate, iron, or vitamin B12 -seizures -an unusual or allergic reaction to darbepoetin, erythropoietin, albumin, hamster proteins, latex, other medicines, foods, dyes, or preservatives -pregnant or trying to get pregnant -breast-feeding How should I use this medicine? This medicine is for injection into a vein or under the skin. It is usually given by a health care professional in a hospital or clinic setting. If you get this medicine at home, you will be taught how to prepare and give this medicine. Do not shake the solution before you withdraw a dose. Use exactly as directed. Take your medicine at regular intervals. Do not take your medicine more often than directed. It is important that you put your used needles and syringes in a special sharps container. Do not put them in a trash can. If you do not have a sharps container, call your pharmacist or healthcare provider to get one. Talk to your pediatrician regarding the use of this medicine in children. While this medicine may be used in children as young as 1 year for selected conditions, precautions do apply. Overdosage: If you think you have taken too much of this medicine contact a poison control center or emergency room at once. NOTE:  This medicine is only for you. Do not share this medicine with others. What if I miss a dose? If you miss a dose, take it as soon as you can. If it is almost time for your next dose, take only that dose. Do not take double or extra doses. What may interact with this medicine? Do not take this medicine with any of the following medications: -epoetin alfa This list may not describe all possible interactions. Give your health care provider a list of all the medicines, herbs, non-prescription drugs, or dietary supplements you use. Also tell them if you smoke, drink alcohol, or use illegal drugs. Some items may interact with your medicine. What should I watch for while using this medicine? Visit your prescriber or health care professional for regular checks on your progress and for the needed blood tests and blood pressure measurements. It is especially important for the doctor to make sure your hemoglobin level is in the desired range, to limit the risk of potential side effects and to give you the best benefit. Keep all appointments for any recommended tests. Check your blood pressure as directed. Ask your doctor what your blood pressure should be and when you should contact him or her. As your body makes more red blood cells, you may need to take iron, folic acid, or vitamin B supplements. Ask your doctor or health care provider which products are right for you. If you have kidney disease continue dietary restrictions, even though this medication can make you feel better. Talk with your doctor or health care professional about the   foods you eat and the vitamins that you take. What side effects may I notice from receiving this medicine? Side effects that you should report to your doctor or health care professional as soon as possible: -allergic reactions like skin rash, itching or hives, swelling of the face, lips, or tongue -breathing problems -changes in vision -chest pain -confusion, trouble speaking  or understanding -feeling faint or lightheaded, falls -high blood pressure -muscle aches or pains -pain, swelling, warmth in the leg -rapid weight gain -severe headaches -sudden numbness or weakness of the face, arm or leg -trouble walking, dizziness, loss of balance or coordination -seizures (convulsions) -swelling of the ankles, feet, hands -unusually weak or tired Side effects that usually do not require medical attention (report to your doctor or health care professional if they continue or are bothersome): -diarrhea -fever, chills (flu-like symptoms) -headaches -nausea, vomiting -redness, stinging, or swelling at site where injected This list may not describe all possible side effects. Call your doctor for medical advice about side effects. You may report side effects to FDA at 1-800-FDA-1088. Where should I keep my medicine? Keep out of the reach of children. Store in a refrigerator between 2 and 8 degrees C (36 and 46 degrees F). Do not freeze. Do not shake. Throw away any unused portion if using a single-dose vial. Throw away any unused medicine after the expiration date. NOTE: This sheet is a summary. It may not cover all possible information. If you have questions about this medicine, talk to your doctor, pharmacist, or health care provider.    2016, Elsevier/Gold Standard. (2008-02-22 10:23:57)  

## 2015-04-16 ENCOUNTER — Encounter: Payer: Self-pay | Admitting: Physician Assistant

## 2015-04-16 ENCOUNTER — Ambulatory Visit (INDEPENDENT_AMBULATORY_CARE_PROVIDER_SITE_OTHER): Payer: Medicare Other | Admitting: Physician Assistant

## 2015-04-16 VITALS — BP 100/60 | HR 56 | Ht 68.0 in | Wt 170.0 lb

## 2015-04-16 DIAGNOSIS — K219 Gastro-esophageal reflux disease without esophagitis: Secondary | ICD-10-CM | POA: Diagnosis not present

## 2015-04-16 DIAGNOSIS — I10 Essential (primary) hypertension: Secondary | ICD-10-CM

## 2015-04-16 DIAGNOSIS — I5023 Acute on chronic systolic (congestive) heart failure: Secondary | ICD-10-CM

## 2015-04-16 MED ORDER — AMLODIPINE BESYLATE 5 MG PO TABS: 5.0000 mg | ORAL_TABLET | Freq: Every day | ORAL | Status: DC

## 2015-04-16 MED ORDER — FAMOTIDINE 20 MG PO TABS: ORAL_TABLET | ORAL | Status: DC

## 2015-04-16 NOTE — Progress Notes (Signed)
Cardiology Office Note   Date:  04/16/2015   ID:  [name] [name], DOB 1946/02/21, MRN [id number]  PCP:  Rocco Serene, MD  Cardiologist:  Dr Jens Som, Dr Tarri Glenn, PA-C   Chief Complaint  Patient presents with  . Follow-up    no chest pain, no swelling, no cramping, no shortness of breath, no dizziness or lightheadedness    History of Present Illness: [name] [name] is a 70 y.o. male with a history of  PCI RCA 2006, EF 50%, HTN, DM, CKD III, HL, HCV, atrial flutter s/p EP study w/out ablation on amio & Eliquis, R-BKA.   D/c 01/09 after admit for CHF exacerbation, CKD now stage V, EF 20-25% (?ICM), elevated TSH. Cards followed, no ischemic eval due to poor renal function.  [name] [name] presents for post-hospital f/u.  Since d/c, [name] [name] has done very well. He is increasing his activity without problems. No DOE, no edema, no chest pain. He is weighing daily, with his artificial leg in place. He has had no dizziness or presyncope. No palpitations. He is compliant with his medications.  He has struggled with nausea ever since prior to gallbladder surgery. Since d/c, he has had problems with nausea, helped by Zofran. He gets it every day, sometimes more than once. He does not associate it with specific foods, but it may be worse first thing in the morning and after meals. He was taking the Pepcid BID but that was changed to qd. His nausea may be a little worse now.    Past Medical History  Diagnosis Date  . Hyperlipidemia 04/02/2006  . Essential hypertension 12/09/2006  . Benign prostatic hypertrophy with nocturia 07/07/2006  . Chronic venous insufficiency 04/12/2010  . Obesity (BMI 30.0-34.9) 01/15/2012  . Constipation 05/25/2009    Intermittent   . Microcytic normochromic anemia 05/27/2006  . Internal and external hemorrhoids without complication 08/20/2012  . Coronary artery disease 04/02/2006    s/p RCA DES 2006, low risk myoview in 2011  . 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 Type 2 diabetes mellitus with peripheral artery disease (HCC) 05/27/2006    Absent pulses in the left foot   . Closed displaced fracture of left femoral neck (HCC) 06/14/2014    s/p left hip hemiarthroplasty June 14, 2014   . Atrial flutter (HCC) 07/26/2014    a. May 2016, CHA2DS2VASc = 4 -> Eliquis, spontaneous conversion to NSR;  b. On amio;  c. 01/2015 EPS: Unable to induce right sided Aflutter. Non-sustained Afib and Left sided Aflutter noted.  [name] GERD (gastroesophageal reflux disease)   . Type 2 diabetes mellitus with stage 4 chronic kidney disease (HCC) 08/06/2012  . History of blood transfusion 05/2014    "related to hip OR"  . Degenerative joint disease involving multiple joints 02/02/2007  . Arthritis     "fingers" (02/07/2015)    Past Surgical History  Procedure Laterality Date  . Pilonidal cyst excision  1990's  . Refractive surgery Bilateral   . Fracture surgery    . Total hip arthroplasty Left 06/14/2014    Procedure: HEMI HIP ARTHROPLASTY ANTERIOR APPROACH;  Surgeon: Tarry Kos, MD;  Location: [facility] OR;  Service: Orthopedics;  Laterality: Left;  . Joint replacement    .  Leg amputation above knee Right ~ 2008  . Cholecystectomy N/A 12/12/2014    Procedure: LAPAROSCOPIC CHOLECYSTECTOMY WITH INTRAOPERATIVE CHOLANGIOGRAM;  Surgeon: Gaynelle Adu, MD;  Location: [facility] OR;  Service: General;  Laterality: N/A;  . Electrophysiologic study N/A 02/07/2015    Procedure: A-Flutter Ablation;  Surgeon: Marinus Maw, MD;  Location: [facility] INVASIVE CV LAB;  Service: Cardiovascular;  Laterality: N/A;  . Tonsillectomy    . Coronary angioplasty with stent placement    . Prostate biopsy  ~ 2013    Current Outpatient Prescriptions    Medication Sig Dispense Refill  . amiodarone (PACERONE) 200 MG tablet Take 1 tablet (200 mg total) by mouth daily. 90 tablet 3  . amLODipine (NORVASC) 10 MG tablet Take 1 tablet (10 mg total) by mouth daily. 90 tablet 3  . calcium citrate-vitamin D (CITRACAL+D) 315-200 MG-UNIT per tablet Take 1 tablet by mouth daily.     . carvedilol (COREG) 12.5 MG tablet Take 1 tablet (12.5 mg total) by mouth 2 (two) times daily with a meal. 180 tablet 3  . Darbepoetin Alfa (ARANESP) 100 MCG/0.5ML SOSY injection Inject 0.5 mLs (100 mcg total) into the skin every 7 (seven) days. 4.2 mL 8  . docusate sodium (COLACE) 100 MG capsule Take 100 mg by mouth at bedtime as needed for moderate constipation (constipation).     [name] ELIQUIS 5 MG TABS tablet Take 5 mg by mouth 2 (two) times daily.    . famotidine (PEPCID) 20 MG tablet Take 1 tablet (20 mg total) by mouth daily. 30 tablet 3  . fish oil-omega-3 fatty acids 1000 MG capsule Take 1 g by mouth daily.    . fluticasone (FLONASE) 50 MCG/ACT nasal spray Place 1 spray into both nostrils daily as needed for allergies. 16 g 3  . furosemide (LASIX) 20 MG tablet Take 3 tablets (60 mg total) by mouth daily. 90 tablet 3  . hydrALAZINE (APRESOLINE) 10 MG tablet Take 1 tablet (10 mg total) by mouth every 8 (eight) hours. 90 tablet 1  . hydroxypropyl methylcellulose (ISOPTO TEARS) 2.5 % ophthalmic solution Place 2 drops into both eyes 4 (four) times daily as needed for dry eyes.    . isosorbide mononitrate (IMDUR) 30 MG 24 hr tablet Take 1 tablet (30 mg total) by mouth daily. 30 tablet 1  . levothyroxine (SYNTHROID, LEVOTHROID) 25 MCG tablet Take 1 tablet (25 mcg total) by mouth daily before breakfast. 30 tablet 1  . Multiple Vitamins-Minerals (MULTIVITAMIN WITH MINERALS) tablet Take 1 tablet by mouth daily.    . ondansetron (ZOFRAN) 4 MG tablet Take 1 tablet (4 mg total) by mouth every 8 (eight) hours as needed for nausea or vomiting. 20 tablet 0  . terazosin (HYTRIN) 5 MG  capsule Take 1 capsule (5 mg total) by mouth at bedtime. 30 capsule 1   No current facility-administered medications for this visit.    Allergies:   Amoxicillin; Lisinopril; Pravastatin; Tamsulosin; and Doxycycline    Social History:  The patient  reports that he quit smoking about 46 years ago. His smoking use included Cigarettes. He has a 10 pack-year smoking history. He has never used smokeless tobacco. He reports that he does not drink alcohol or use illicit drugs.   Family History:  The patient's family history includes Alcoholism in his brother; Arthritis in his sister and sister; Bipolar disorder in his daughter; Breast cancer in his sister; Diabetes in his brother, brother, and brother; Drug abuse in his daughter and daughter; HIV in his  daughter; Heart attack in his brother and father; Hypertension in his daughter, daughter, and mother; Pneumonia in his daughter; Schizophrenia in his daughter; Stroke in his brother.    ROS:  Please see the history of present illness. All other systems are reviewed and negative.    PHYSICAL EXAM: VS:  BP 100/60 mmHg  Pulse 56  Ht  (1.727 m)  Wt 170 lb (77.111 kg)  BMI 25.85 kg/m2 , BMI Body mass index is 25.85 kg/(m^2). GEN: Well nourished, well developed, male in no acute distress HEENT: normal for age  Neck: no JVD, minimal HJ reflux, no carotid bruit, no masses Cardiac: RRR; no murmur, no rubs, or gallops Respiratory:  clear to auscultation bilaterally, normal work of breathing GI: soft, nontender, nondistended, + BS MS: no deformity or atrophy; no edema; distal pulses are 2+ in all 3 extremities  Skin: warm and dry, no rash Neuro:  Strength and sensation are intact Psych: euthymic mood, full affect   EKG:  EKG is not ordered today.  Recent Labs: 12/12/2014: ALT 16* 12/13/2014: Magnesium 1.7 03/29/2015: B Natriuretic Peptide >4500.0* 03/31/2015: Platelets 148* 04/01/2015: TSH 16.528* 04/10/2015: BUN 84*; Creatinine, Ser 4.46*;  Potassium 4.4; Sodium 139 04/13/2015: Hemoglobin 9.5*    Lipid Panel    Component Value Date/Time   CHOL 132 12/11/2014 0430   TRIG 187* 12/11/2014 0430   HDL 23* 12/11/2014 0430   CHOLHDL 5.7 12/11/2014 0430   VLDL 37 12/11/2014 0430   LDLCALC 72 12/11/2014 0430   LDLDIRECT 42.1 09/19/2008 0000     Wt Readings from Last 3 Encounters:  04/16/15 170 lb (77.111 kg)  04/10/15 181 lb 14.4 oz (82.509 kg)  04/01/15 171 lb 12.8 oz (77.928 kg)     Other studies Reviewed: Additional studies/ records that were reviewed today include: hospital records and testing.  ASSESSMENT AND PLAN:  1.  Chronic combined systolic and diastolic CHF: weight is stable and volume status is good. He is being followed by IM for this, recent BMET with renal function at baseline. Will let them manage his diuretics and renal function. Will ck an echo in 3 months at his f/u with Dr Jens Som. If his EF does not improve, continue medical management for presumed ICM until we can cath him. He is on Coreg, hydralazine and Imdur. No ACE/ARB due to poor renal function.   2. HTN: he is compliant with his BP medications. His BP is low, but he is asymptomatic. Will decrease the amlodipine, follow. With diastolic dysfunction, may need to d/c the amlodipine in order to increase the hydralazine.   3. Nause, hx GERD: Pt thinks his sx were a little better on bid Pepcid. Go back on this dose. Explained how not having a gallbladder might make him not tolerate certain foods, especially fatty ones. Pt admits he eats poorly at times.  Current medicines are reviewed at length with the patient today.  The patient does not have concerns regarding medicines.  The following changes have been made:  Decrease amlodipine, increase Pepcid  Labs/ tests ordered today include:   Orders Placed This Encounter  Procedures  . ECHOCARDIOGRAM COMPLETE     Disposition:   FU with Dr Jens Som  Signed, Leanna Battles  04/16/2015 5:58 PM     [facility] [address] [place], [place], [place]  [address] Phone: [phone]; Fax: [phone]

## 2015-04-16 NOTE — Patient Instructions (Signed)
Decrease Norvasc ( Amlodipine ) to 5 mg daily  Increase Pepcid to 20 mg before breakfast and before supper   Follow up with Dr.Crenshaw in 3 months Needs Echo before appointment with Dr.Crenshaw

## 2015-04-20 ENCOUNTER — Encounter: Payer: Self-pay | Admitting: Internal Medicine

## 2015-04-20 ENCOUNTER — Ambulatory Visit (INDEPENDENT_AMBULATORY_CARE_PROVIDER_SITE_OTHER): Payer: Medicare Other | Admitting: Internal Medicine

## 2015-04-20 ENCOUNTER — Encounter (HOSPITAL_COMMUNITY)
Admission: RE | Admit: 2015-04-20 | Discharge: 2015-04-20 | Disposition: A | Discharge status: Home / Self Care | Payer: Medicare Other | Source: Ambulatory Visit | Attending: Internal Medicine | Admitting: Internal Medicine

## 2015-04-20 VITALS — BP 126/67 | HR 54 | Temp 97.7°F | Wt 179.4 lb

## 2015-04-20 DIAGNOSIS — I4892 Unspecified atrial flutter: Secondary | ICD-10-CM

## 2015-04-20 DIAGNOSIS — I25118 Atherosclerotic heart disease of native coronary artery with other forms of angina pectoris: Secondary | ICD-10-CM

## 2015-04-20 DIAGNOSIS — E1122 Type 2 diabetes mellitus with diabetic chronic kidney disease: Secondary | ICD-10-CM | POA: Diagnosis not present

## 2015-04-20 DIAGNOSIS — N184 Chronic kidney disease, stage 4 (severe): Secondary | ICD-10-CM

## 2015-04-20 DIAGNOSIS — I130 Hypertensive heart and chronic kidney disease with heart failure and stage 1 through stage 4 chronic kidney disease, or unspecified chronic kidney disease: Secondary | ICD-10-CM | POA: Diagnosis not present

## 2015-04-20 DIAGNOSIS — I5022 Chronic systolic (congestive) heart failure: Secondary | ICD-10-CM

## 2015-04-20 DIAGNOSIS — E039 Hypothyroidism, unspecified: Secondary | ICD-10-CM

## 2015-04-20 DIAGNOSIS — I10 Essential (primary) hypertension: Secondary | ICD-10-CM

## 2015-04-20 DIAGNOSIS — I700 Atherosclerosis of aorta: Secondary | ICD-10-CM

## 2015-04-20 DIAGNOSIS — Z79899 Other long term (current) drug therapy: Secondary | ICD-10-CM

## 2015-04-20 DIAGNOSIS — N189 Chronic kidney disease, unspecified: Secondary | ICD-10-CM | POA: Diagnosis not present

## 2015-04-20 DIAGNOSIS — K219 Gastro-esophageal reflux disease without esophagitis: Secondary | ICD-10-CM

## 2015-04-20 DIAGNOSIS — D631 Anemia in chronic kidney disease: Secondary | ICD-10-CM

## 2015-04-20 DIAGNOSIS — I25119 Atherosclerotic heart disease of native coronary artery with unspecified angina pectoris: Secondary | ICD-10-CM

## 2015-04-20 DIAGNOSIS — N179 Acute kidney failure, unspecified: Secondary | ICD-10-CM

## 2015-04-20 LAB — POCT HEMOGLOBIN-HEMACUE: HEMOGLOBIN: 10.2 g/dL — AB (ref 13.0–17.0)

## 2015-04-20 LAB — POCT GLYCOSYLATED HEMOGLOBIN (HGB A1C): Hemoglobin A1C: 5.6

## 2015-04-20 LAB — GLUCOSE, CAPILLARY: GLUCOSE-CAPILLARY: 134 mg/dL — AB (ref 65–99)

## 2015-04-20 MED ORDER — DARBEPOETIN ALFA 100 MCG/0.5ML IJ SOSY
PREFILLED_SYRINGE | INTRAMUSCULAR | Status: AC
  Filled 2015-04-20: qty 0.5

## 2015-04-20 MED ORDER — DARBEPOETIN ALFA 100 MCG/0.5ML IJ SOSY
100.0000 ug | PREFILLED_SYRINGE | INTRAMUSCULAR | Status: DC
  Administered 2015-04-20: 100 ug via SUBCUTANEOUS

## 2015-04-20 NOTE — Assessment & Plan Note (Signed)
Assessment  His hemoglobin is now 10 after receiving Aranesp.  Plan  I have written for Aranesp 100 g every 21 days as long as his hemoglobin remains less than 11. This will be provided in the day stay area. If he requires long-term Aranesp we will also initiate therapy with oral iron. This is to assure he has the necessary building blocks for the hemoglobin.

## 2015-04-20 NOTE — Progress Notes (Signed)
   Subjective:    Patient ID: [name] [name], male    DOB: 11/14/1945, 70 y.o.   MRN: [id number]  HPI  [name] is here for follow-up of his chronic systolic heart failure, diabetes, and hypertension. Please see the A&P for the status of the pt's chronic medical problems.  Review of Systems  Constitutional: Positive for appetite change. Negative for activity change and unexpected weight change.       Improvement in appetite since discharge.  Respiratory: Negative for chest tightness and shortness of breath.        Hiccups at night which improved when he went back to BID famotidine.  Cardiovascular: Negative for chest pain, palpitations and leg swelling.  Gastrointestinal: Negative for nausea, vomiting, abdominal pain, diarrhea, constipation and abdominal distention.  Neurological: Negative for dizziness, syncope and light-headedness.      Objective:   Physical Exam  Constitutional: He is oriented to person, place, and time. He appears well-developed and well-nourished. No distress.  HENT:  Head: Normocephalic and atraumatic.  Eyes: Conjunctivae are normal. Right eye exhibits no discharge. Left eye exhibits no discharge. No scleral icterus.  Cardiovascular: Normal rate and regular rhythm.  Exam reveals no gallop and no friction rub.   Murmur heard. Late crescendo systolic murmur.  Pulmonary/Chest: Effort normal and breath sounds normal. No respiratory distress. He has no wheezes. He has no rales.  Abdominal: Soft. Bowel sounds are normal. He exhibits no distension. There is no tenderness. There is no rebound and no guarding.  Musculoskeletal: Normal range of motion. He exhibits no edema or tenderness.  Left lower extremity.  Right lower extremity is a prosthesis.  Neurological: He is alert and oriented to person, place, and time. He exhibits normal muscle tone.  Skin: Skin is warm and dry. No rash noted. He is not diaphoretic. No erythema.  Psychiatric: He has a normal mood  and affect. His behavior is normal. Judgment and thought content normal.  Nursing note and vitals reviewed.     Assessment & Plan:   Please see problem oriented charting.

## 2015-04-20 NOTE — Assessment & Plan Note (Signed)
Assessment  He has not had any chest pain on his current therapy which includes imdur.  Plan  We will continue the Imdur at 30 mg by mouth daily and reassess for evidence of angina at the follow-up visit.

## 2015-04-20 NOTE — Assessment & Plan Note (Signed)
Assessment  He had worsening symptoms of his reflux as manifested by nocturnal hiccups when he was converted from famotidine twice daily to once daily to adjust his dose for his renal function. Subsequently, his famotidine dose was increased back to twice daily with improvement in his hiccups and symptoms of reflux.  Plan  We will continue the famotidine at 20 mg by mouth twice daily realizing this may be slightly high in the setting of his renal insufficiency. We will monitor closely for evidence of toxicity and if his renal function worsens rather than slightly improves as is expected we may have to reassess our therapy for his reflux disease.

## 2015-04-20 NOTE — Assessment & Plan Note (Signed)
Assessment  His blood pressure remains well controlled today at 126/65. This is on amlodipine 5 mg by mouth daily, carvedilol 12.5 mg by mouth twice daily, hydralazine 10 mg by mouth 3 times daily, Imdur 30 mg by mouth daily, and Terazosin 5 mg by mouth at bedtime.  Plan  We will continue the amlodipine, carvedilol, hydralazine, Imdur, and terazosin at the current doses and reassess his blood pressure control at the follow-up visit.

## 2015-04-20 NOTE — Assessment & Plan Note (Signed)
Assessment  During admission he was found to have a TSH of 18. This was felt to possibly be contributing to his heart failure and he was started on Synthroid 25 g by mouth daily. The differential could also include decompensated cardiomyopathy with a sick euthyroid syndrome. Obviously, we 1 a be very careful with Synthroid replacement in this man with underlying coronary artery disease and atrial flutter.  Plan  We will continue the Synthroid at 25 g per mouth daily at this time but will check a TSH at the follow-up visit. If it is low he likely had sick euthyroid syndrome during the hospitalization and the Synthroid will be discontinued. If the TSH is normal we will continue the Synthroid at 25 g by mouth daily. If the TSH is elevated we will appropriately adjust the Synthroid.

## 2015-04-20 NOTE — Assessment & Plan Note (Signed)
Assessment  His aortic atherosclerosis remains asymptomatic without symptoms of claudication or evidence of critical ischemia.  Plan  We will continue aggressive control of his diabetes and hypertension. We will reassess for evidence of symptomatic aortic atherosclerosis at the follow-up visit.

## 2015-04-20 NOTE — Assessment & Plan Note (Signed)
Assessment  His chronic systolic heart failure is currently symptomatically well controlled on carvedilol 12.5 mg by mouth twice daily, furosemide 60 mg by mouth daily, hydralazine 10 mg by mouth 3 times daily, and isosorbide mononitrate 30 mg by mouth daily. He continues to watch both his fluid intake as well as his salt intake. At this point he is able to lie flat with one pillow and denies any orthopnea or paroxysmal nocturnal dyspnea is a marked improvement from prior to admission.  Plan  We will continue the carvedilol at 12.5 mg by mouth twice daily, hydralazine 10 mg by mouth 3 times daily, isosorbide mononitrate 30 mg by mouth daily, and furosemide 60 mg by mouth every morning. He is not a candidate for an ACE inhibitor or ARB as he's had acute renal failure develop each time these agents were introduced. We will reassess for evidence of decompensated chronic systolic cardiomyopathy at the follow-up visit.

## 2015-04-20 NOTE — Patient Instructions (Signed)
It was great to see you as always.  You are doing much better with your fluid!  1) Keep taking your medications as you are.  2) Keep avoiding salt and watch how much water you drink.  3) You are doing good with your diet.  I will see you at my next opening.  If you need to be seen sooner give [place] a call and we will get you in.

## 2015-04-30 ENCOUNTER — Ambulatory Visit (INDEPENDENT_AMBULATORY_CARE_PROVIDER_SITE_OTHER): Payer: Medicare Other | Admitting: Sports Medicine

## 2015-04-30 ENCOUNTER — Encounter: Payer: Self-pay | Admitting: Sports Medicine

## 2015-04-30 VITALS — BP 131/65 | HR 53 | Resp 14

## 2015-04-30 DIAGNOSIS — S90222A Contusion of left lesser toe(s) with damage to nail, initial encounter: Secondary | ICD-10-CM

## 2015-04-30 DIAGNOSIS — M79676 Pain in unspecified toe(s): Secondary | ICD-10-CM | POA: Diagnosis not present

## 2015-04-30 DIAGNOSIS — E1142 Type 2 diabetes mellitus with diabetic polyneuropathy: Secondary | ICD-10-CM

## 2015-04-30 DIAGNOSIS — L600 Ingrowing nail: Secondary | ICD-10-CM

## 2015-04-30 NOTE — Progress Notes (Signed)
Patient ID: [name] [name], male   DOB: June 28, 1945, 70 y.o.   MRN: [id number]   Subjective: [name] [name] is a 70 y.o.  Diabetic male patient presents to office today complaining of a painful dark and bloody left big toenail with splitting that has been present > 1 month; patient states that he caught it on bed comforter causing injury to nail.  Patient denies fever/chills/nausea/vomitting/any other related constitutional symptoms at this time.  Patient Active Problem List   Diagnosis Date Noted  . Heart failure with reduced ejection fraction (HCC) 04/10/2015  . Longitudinal split of nail 04/10/2015  . Hypothyroidism 12/12/2014  . Chronic hepatitis C without hepatic coma (HCC) 12/11/2014  . Aortic atherosclerosis (HCC) 11/30/2014  . Hematuria, gross 11/22/2014  . Right bundle branch block (RBBB) 07/27/2014  . Paroxysmal atrial flutter (HCC) 07/26/2014  . Gastroesophageal reflux disease without esophagitis 07/01/2013  . Internal and external hemorrhoids without complication 08/20/2012  . Type 2 diabetes mellitus with stage 4 chronic kidney disease (HCC) 08/06/2012  . Overweight (BMI 25.0-29.9) 01/15/2012  . Healthcare maintenance 12/12/2010  . Chronic venous insufficiency 04/12/2010  . Seasonal allergic rhinitis 05/25/2009  . Degenerative joint disease involving multiple joints 02/02/2007  . Essential hypertension 12/09/2006  . 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 Benign prostatic hypertrophy with nocturia 07/07/2006  . Anemia of chronic renal failure, stage 4 (severe) (HCC) 05/27/2006  . Type 2 diabetes mellitus with peripheral artery disease (HCC) 05/27/2006  . 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 Type 2 diabetes mellitus, controlled, with ophthalmic manifestations, without macular edema, with severe nonproliferative retinopathy 04/02/2006  . Coronary artery disease of native  artery with stable angina pectoris (HCC) 04/02/2006   Current Outpatient Prescriptions on File Prior to Visit  Medication Sig Dispense Refill  . amiodarone (PACERONE) 200 MG tablet Take 1 tablet (200 mg total) by mouth daily. 90 tablet 3  . amLODipine (NORVASC) 5 MG tablet Take 1 tablet (5 mg total) by mouth daily. 30 tablet 6  . calcium citrate-vitamin D (CITRACAL+D) 315-200 MG-UNIT per tablet Take 1 tablet by mouth daily.     . carvedilol (COREG) 12.5 MG tablet Take 1 tablet (12.5 mg total) by mouth 2 (two) times daily with a meal. 180 tablet 3  . Darbepoetin Alfa (ARANESP) 100 MCG/0.5ML SOSY injection Inject 0.5 mLs (100 mcg total) into the skin every 7 (seven) days. 4.2 mL 8  . docusate sodium (COLACE) 100 MG capsule Take 100 mg by mouth at bedtime as needed for moderate constipation (constipation). Reported on 04/20/2015    . ELIQUIS 5 MG TABS tablet Take 5 mg by mouth 2 (two) times daily.    . famotidine (PEPCID) 20 MG tablet Take 20 mg before breakfast and 20 mg before supper 60 tablet 6  . fish oil-omega-3 fatty acids 1000 MG capsule Take 1 g by mouth daily.    . fluticasone (FLONASE) 50 MCG/ACT nasal spray Place 1 spray into both nostrils daily as needed for allergies. 16 g 3  . furosemide (LASIX) 20 MG tablet Take 3 tablets (60 mg total) by mouth daily. 90 tablet 3  . hydrALAZINE (APRESOLINE) 10 MG tablet Take 1 tablet (10 mg total) by mouth every 8 (eight) hours. 90 tablet 1  . hydroxypropyl methylcellulose (ISOPTO TEARS) 2.5 % ophthalmic solution Place 2 drops into both eyes 4 (four) times daily as needed for dry eyes. Reported on 04/20/2015    .  isosorbide mononitrate (IMDUR) 30 MG 24 hr tablet Take 1 tablet (30 mg total) by mouth daily. 30 tablet 1  . levothyroxine (SYNTHROID, LEVOTHROID) 25 MCG tablet Take 1 tablet (25 mcg total) by mouth daily before breakfast. 30 tablet 1  . Multiple Vitamins-Minerals (MULTIVITAMIN WITH MINERALS) tablet Take 1 tablet by mouth daily.    .  ondansetron (ZOFRAN) 4 MG tablet Take 1 tablet (4 mg total) by mouth every 8 (eight) hours as needed for nausea or vomiting. 20 tablet 0  . terazosin (HYTRIN) 5 MG capsule Take 1 capsule (5 mg total) by mouth at bedtime. 30 capsule 1   No current facility-administered medications on file prior to visit.   Allergies  Allergen Reactions  . Amoxicillin Swelling and Other (See Comments)    Puffy eyes and abdominal pain with Amoxicillin PO  . Lisinopril Other (See Comments)    Acute kidney injury  . Pravastatin Other (See Comments)    Weakness and fatigue  . Tamsulosin Other (See Comments)    REACTION: dry throat, sweating, blurred vision  . Doxycycline Rash and Other (See Comments)    Questionable drug rxn rash   Objective:  Vitals: Reviewed  General: Well developed, nourished, in no acute distress, alert and oriented x3, wheelchair assisted gait   Dermatology: Skin is warm, dry and supple bilateral. Left hallux nail 100% dry subungal hematoma with spliting of nail, all other nails free of blood, mildly elongated and thickned. (-) Erythema. (-) Edema. (-) serosanguous drainage present. There are no open sores, lesions or other signs of infection present.  Vascular: Dorsalis Pedis artery and Posterior Tibial artery pedal pulses are 1/4 bilateral with immedate capillary fill time. Scant hair growth present. No lower extremity edema.   Neruologic: Grossly intact via light touch bilateral. Protective and Vibratory sensation absent bilateral.   Musculoskeletal: Minimal tenderness to palpation of the Left hallux nail. Muscular strength within acceptable in all groups bilateral.   Assesement and Plan: Problem List Items Addressed This Visit    None    Visit Diagnoses    Subungual hematoma of toenail of left foot, initial encounter    -  Primary    Diabetic polyneuropathy associated with type 2 diabetes mellitus (HCC)        Pain of toe, unspecified laterality          -Discussed  treatment alternatives and plan of care; Explained need for temporary nail avulsion in setting of complete hematoma and post procedure course to patient. - After a verbal consent, injected 3 ml of a 50:50 mixture of 2% plain lidocaine and 0.5% plain marcaine in a normal hallux block fashion. Next, a betadine prep was performed. Anesthesia was tested and found to be appropriate.  The Left hallux nail was incised from the hyponychium to the epinychium removing the left hallux nail completely with #3 handle, nail bed was inspected and areas flushed with alcohol andd dressed with antibiotic cream and a dry sterile dressing. -Patient was instructed to leave the dressing intact for today and begin soaking  in a weak solution of Epsom and water tomorrow with assistance of wife. Patient was instructed to  soak for 15 minutes each day and apply neosporin and a gauze or bandaid dressing each day. -Patient was instructed to monitor the toe for signs of infection and return to office if toe becomes red, hot or swollen. -Patient is to return in 1 week for follow up care/nail check and for any other needed diabetic foot care  or sooner if problems arise.  Asencion Islam, DPM

## 2015-04-30 NOTE — Patient Instructions (Signed)
SOAK WITH 1/4 CUP OF EPSOM SALT AND WARM DAILY AND COVER WITH BANDAID

## 2015-05-11 ENCOUNTER — Telehealth: Payer: Self-pay | Admitting: *Deleted

## 2015-05-11 ENCOUNTER — Encounter (HOSPITAL_COMMUNITY)
Admission: RE | Admit: 2015-05-11 | Discharge: 2015-05-11 | Disposition: A | Discharge status: Home / Self Care | Payer: Medicare Other | Source: Ambulatory Visit | Attending: Internal Medicine | Admitting: Internal Medicine

## 2015-05-11 DIAGNOSIS — D631 Anemia in chronic kidney disease: Secondary | ICD-10-CM | POA: Insufficient documentation

## 2015-05-11 DIAGNOSIS — N184 Chronic kidney disease, stage 4 (severe): Secondary | ICD-10-CM | POA: Insufficient documentation

## 2015-05-11 LAB — POCT HEMOGLOBIN-HEMACUE: Hemoglobin: 12.4 g/dL — ABNORMAL LOW (ref 13.0–17.0)

## 2015-05-11 MED ORDER — DARBEPOETIN ALFA 100 MCG/0.5ML IJ SOSY: 100.0000 ug | PREFILLED_SYRINGE | INTRAMUSCULAR | Status: DC

## 2015-05-11 NOTE — Telephone Encounter (Signed)
Pt's wife here with paperwork stating pt's insurance will no longer cover his Eliquis  tab.  Contacted pt's insurance at [phone] to initiate PA.  Pt has diagnosis of atrial "flutter"-request sent for review.  Decision may take up to 72 hours, pt's wife aware and patient has enough medications on hand.Kingsley Spittle Cassady2/17/201712:11 PM    PA Ref# [id number].

## 2015-05-11 NOTE — Progress Notes (Signed)
Hemocue 12.4, aranesp held per orders.

## 2015-05-14 ENCOUNTER — Ambulatory Visit (INDEPENDENT_AMBULATORY_CARE_PROVIDER_SITE_OTHER): Payer: Medicare Other | Admitting: Sports Medicine

## 2015-05-14 ENCOUNTER — Encounter: Payer: Self-pay | Admitting: Sports Medicine

## 2015-05-14 VITALS — BP 118/64 | HR 74 | Resp 16

## 2015-05-14 DIAGNOSIS — E1142 Type 2 diabetes mellitus with diabetic polyneuropathy: Secondary | ICD-10-CM

## 2015-05-14 DIAGNOSIS — S90222D Contusion of left lesser toe(s) with damage to nail, subsequent encounter: Secondary | ICD-10-CM

## 2015-05-14 DIAGNOSIS — M79676 Pain in unspecified toe(s): Secondary | ICD-10-CM

## 2015-05-14 NOTE — Progress Notes (Signed)
Patient ID: [name] [name], male   DOB: 09-04-1945, 70 y.o.   MRN: [id number] Subjective: [name] [name] is a 70 y.o.  Diabetic male patient returns to office today for follow up evaluation after having Left Hallux total temporary nail avulsion performed on 04-30-15 secondary to hematoma. Patient has been soaking using epsom salt and applying topical antibiotic covered with bandaid daily. Patient deniesfever/chills/nausea/vomitting/any other related constitutional symptoms at this time.  Patient Active Problem List   Diagnosis Date Noted  . Heart failure with reduced ejection fraction (HCC) 04/10/2015  . Longitudinal split of nail 04/10/2015  . Hypothyroidism 12/12/2014  . Chronic hepatitis C without hepatic coma (HCC) 12/11/2014  . Aortic atherosclerosis (HCC) 11/30/2014  . Hematuria, gross 11/22/2014  . Right bundle branch block (RBBB) 07/27/2014  . Paroxysmal atrial flutter (HCC) 07/26/2014  . Gastroesophageal reflux disease without esophagitis 07/01/2013  . Internal and external hemorrhoids without complication 08/20/2012  . Type 2 diabetes mellitus with stage 4 chronic kidney disease (HCC) 08/06/2012  . Overweight (BMI 25.0-29.9) 01/15/2012  . Healthcare maintenance 12/12/2010  . Chronic venous insufficiency 04/12/2010  . Seasonal allergic rhinitis 05/25/2009  . Degenerative joint disease involving multiple joints 02/02/2007  . Essential hypertension 12/09/2006  . 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 Benign prostatic hypertrophy with nocturia 07/07/2006  . Anemia of chronic renal failure, stage 4 (severe) (HCC) 05/27/2006  . Type 2 diabetes mellitus with peripheral artery disease (HCC) 05/27/2006  . 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 Type 2 diabetes mellitus, controlled, with ophthalmic manifestations, without macular edema, with severe nonproliferative retinopathy 04/02/2006   . 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 amiodarone (PACERONE) 200 MG tablet Take 1 tablet (200 mg total) by mouth daily. 90 tablet 3  . amLODipine (NORVASC) 5 MG tablet Take 1 tablet (5 mg total) by mouth daily. 30 tablet 6  . calcium citrate-vitamin D (CITRACAL+D) 315-200 MG-UNIT per tablet Take 1 tablet by mouth daily.     . carvedilol (COREG) 12.5 MG tablet Take 1 tablet (12.5 mg total) by mouth 2 (two) times daily with a meal. 180 tablet 3  . Darbepoetin Alfa (ARANESP) 100 MCG/0.5ML SOSY injection Inject 0.5 mLs (100 mcg total) into the skin every 7 (seven) days. 4.2 mL 8  . docusate sodium (COLACE) 100 MG capsule Take 100 mg by mouth at bedtime as needed for moderate constipation (constipation). Reported on 04/20/2015    . ELIQUIS 5 MG TABS tablet Take 5 mg by mouth 2 (two) times daily.    . famotidine (PEPCID) 20 MG tablet Take 20 mg before breakfast and 20 mg before supper 60 tablet 6  . fish oil-omega-3 fatty acids 1000 MG capsule Take 1 g by mouth daily.    . fluticasone (FLONASE) 50 MCG/ACT nasal spray Place 1 spray into both nostrils daily as needed for allergies. 16 g 3  . furosemide (LASIX) 20 MG tablet Take 3 tablets (60 mg total) by mouth daily. 90 tablet 3  . hydrALAZINE (APRESOLINE) 10 MG tablet Take 1 tablet (10 mg total) by mouth every 8 (eight) hours. 90 tablet 1  . hydroxypropyl methylcellulose (ISOPTO TEARS) 2.5 % ophthalmic solution Place 2 drops into both eyes 4 (four) times daily as needed for dry eyes. Reported on 04/20/2015    . isosorbide mononitrate (IMDUR)  30 MG 24 hr tablet Take 1 tablet (30 mg total) by mouth daily. 30 tablet 1  . levothyroxine (SYNTHROID, LEVOTHROID) 25 MCG tablet Take 1 tablet (25 mcg total) by mouth daily before breakfast. 30 tablet 1  . Multiple Vitamins-Minerals (MULTIVITAMIN WITH MINERALS) tablet Take 1  tablet by mouth daily.    . ondansetron (ZOFRAN) 4 MG tablet Take 1 tablet (4 mg total) by mouth every 8 (eight) hours as needed for nausea or vomiting. 20 tablet 0  . terazosin (HYTRIN) 5 MG capsule Take 1 capsule (5 mg total) by mouth at bedtime. 30 capsule 1   No current facility-administered medications on file prior to visit.   Allergies  Allergen Reactions  . Amoxicillin Swelling and Other (See Comments)    Puffy eyes and abdominal pain with Amoxicillin PO  . Lisinopril Other (See Comments)    Acute kidney injury  . Pravastatin Other (See Comments)    Weakness and fatigue  . Tamsulosin Other (See Comments)    REACTION: dry throat, sweating, blurred vision  . Doxycycline Rash and Other (See Comments)    Questionable drug rxn rash   Objective:  General: Well developed, nourished, in no acute distress, alert and oriented x3   Dermatology: Skin is warm, dry and supple bilateral. Left hallux nail bed appears to be clean, dry, with mild granular tissue and surrounding eschar/scab. (-) Erythema. (-) Edema. (-) serosanguous drainage present. The remaining nails appear unremarkable free from hematoma and ingrowing at this time. There are no other lesions or other signs of infection present.  Neurovascular status:Unchanged. No lower extremity swelling; No pain with calf compression bilateral.  Musculoskeletal: Decreased tenderness to palpation of the Left hallux nailbed. Muscular strength within normal limits bilateral.   Assesement and Plan: Problem List Items Addressed This Visit    None    Visit Diagnoses    Subungual hematoma of toenail of left foot, subsequent encounter    -  Primary    S/P total nail avulsion     Diabetic polyneuropathy associated with type 2 diabetes mellitus (HCC)        Pain of toe, unspecified laterality           -Examined patient  -Cleansed left hallux nail bed and gently scrubbed with peroxide and applied antibiotic cream covered with bandaid.   -Discussed plan of care with patient. -Patient to cont soaking in a weak solution of Epsom salt and warm water for about 1 more week. Patient was instructed to soak for 15-20 minutes each day until the toe appears normal and there is no drainage, redness, tenderness, or swelling at the procedure site, and apply neosporin and a gauze or bandaid dressing each day as needed. May leave open to air at night. -Educated patient on long term care after nail surgery. -Patient was instrcuted to monitor the toe for reoccurrence and signs of infection; Patient advised to return to office if toe becomes red, hot or swollen. -Patient is to return in 2 months for routine diabetic foot care or sooner if problems arise.  Asencion Islam, DPM

## 2015-05-23 NOTE — Telephone Encounter (Signed)
Medication was approved through 03/23/2016.

## 2015-05-29 ENCOUNTER — Other Ambulatory Visit: Payer: Self-pay | Admitting: Internal Medicine

## 2015-05-29 DIAGNOSIS — R351 Nocturia: Secondary | ICD-10-CM

## 2015-05-29 DIAGNOSIS — N401 Enlarged prostate with lower urinary tract symptoms: Secondary | ICD-10-CM

## 2015-05-29 DIAGNOSIS — I5022 Chronic systolic (congestive) heart failure: Secondary | ICD-10-CM

## 2015-06-01 ENCOUNTER — Other Ambulatory Visit: Payer: Self-pay | Admitting: *Deleted

## 2015-06-01 ENCOUNTER — Ambulatory Visit (INDEPENDENT_AMBULATORY_CARE_PROVIDER_SITE_OTHER): Payer: Medicare Other | Admitting: Internal Medicine

## 2015-06-01 ENCOUNTER — Telehealth: Payer: Self-pay | Admitting: *Deleted

## 2015-06-01 ENCOUNTER — Encounter (HOSPITAL_COMMUNITY)
Admission: RE | Admit: 2015-06-01 | Discharge: 2015-06-01 | Disposition: A | Discharge status: Home / Self Care | Payer: Medicare Other | Source: Ambulatory Visit | Attending: Internal Medicine | Admitting: Internal Medicine

## 2015-06-01 ENCOUNTER — Encounter: Payer: Self-pay | Admitting: Internal Medicine

## 2015-06-01 VITALS — BP 119/77 | HR 55 | Temp 97.3°F | Ht 68.0 in | Wt 169.8 lb

## 2015-06-01 DIAGNOSIS — R112 Nausea with vomiting, unspecified: Secondary | ICD-10-CM

## 2015-06-01 DIAGNOSIS — D631 Anemia in chronic kidney disease: Secondary | ICD-10-CM | POA: Diagnosis present

## 2015-06-01 DIAGNOSIS — E039 Hypothyroidism, unspecified: Secondary | ICD-10-CM

## 2015-06-01 DIAGNOSIS — N184 Chronic kidney disease, stage 4 (severe): Secondary | ICD-10-CM | POA: Diagnosis present

## 2015-06-01 LAB — BASIC METABOLIC PANEL
Anion gap: 13 (ref 5–15)
BUN: 122 mg/dL — AB (ref 6–20)
CHLORIDE: 101 mmol/L (ref 101–111)
CO2: 21 mmol/L — ABNORMAL LOW (ref 22–32)
CREATININE: 5.28 mg/dL — AB (ref 0.61–1.24)
Calcium: 8.6 mg/dL — ABNORMAL LOW (ref 8.9–10.3)
GFR calc Af Amer: 11 mL/min — ABNORMAL LOW (ref >60)
GFR calc non Af Amer: 10 mL/min — ABNORMAL LOW (ref >60)
GLUCOSE: 248 mg/dL — AB (ref 65–99)
Potassium: 4.4 mmol/L (ref 3.5–5.1)
SODIUM: 135 mmol/L (ref 135–145)

## 2015-06-01 LAB — POCT HEMOGLOBIN-HEMACUE: HEMOGLOBIN: 12.1 g/dL — AB (ref 13.0–17.0)

## 2015-06-01 MED ORDER — ONDANSETRON HCL 4 MG PO TABS: 4.0000 mg | ORAL_TABLET | Freq: Three times a day (TID) | ORAL | Status: DC | PRN

## 2015-06-01 MED ORDER — LEVOTHYROXINE SODIUM 25 MCG PO TABS: 25.0000 ug | ORAL_TABLET | Freq: Every day | ORAL | Status: DC

## 2015-06-01 MED ORDER — DARBEPOETIN ALFA 100 MCG/0.5ML IJ SOSY: 100.0000 ug | PREFILLED_SYRINGE | INTRAMUSCULAR | Status: DC

## 2015-06-01 NOTE — Progress Notes (Signed)
Patient ID: [name] [name], male   DOB: April 26, 1945, 69 y.o.   MRN: [id number] [facility] Subjective:   Patient ID: [name] [name] male   DOB: [birth year] 70 y.o.   MRN: [id number]  HPI: [name] [name] is a 70 y.o. male with ischemic heart failure with reduced ejection fraction of 25-30%, coronary artery disease status-post drug-elusting stent placement in 2006, atrial flutter on apixaban, chronic kidney disease state IV, hypertension, and hyperlipidemia presenting to clinic with nausea and vomiting.  Nausea and vomiting: For the last 4 months, since getting a cholecystectomy, he has had progressive nausea and vomiting. It started out being mostly post-prandial but has been worsening in between meals over the last week. Notably, he's been drinking olive oil to help his stomach and eats numerous fatty foods such as cole slaw from [facility]. Last night he was unable to sleep because of the nausea. He denies any lower leg swelling, confusion, diarrhea, fevers, chest pain, leg cramps, or other complaints. When asked about the idea of starting dialysis, he said he would not go through it because he saw his dad suffer on dialysis on his final days. He simply wants to die at home peacefully when his wife when he feels the time is coming.  He is not smoking and I have reviewed his medications with him.  Please see the assessment and plan for the status of the patient's chronic medical problems.  Review of Systems  Constitutional: Positive for weight loss and malaise/fatigue. Negative for fever and chills.  Respiratory: Negative for cough and shortness of breath.   Cardiovascular: Negative for chest pain and leg swelling.  Gastrointestinal: Positive for nausea and vomiting. Negative for abdominal pain, diarrhea, constipation, blood in stool and melena.  Skin: Negative for rash.  Neurological: Negative for loss of consciousness.   Objective:  Physical Exam: Filed Vitals:   06/01/15 1046  BP: 119/77  Pulse: 55  Temp: 97.3 F (36.3 C)  TempSrc: Oral  Height:  (1.727 m)  Weight: 169 lb 12.8 oz (77.021 kg)  SpO2: 100%   General: resting in chair comfortably, appropriately conversational HEENT: no scleral icterus, extra-ocular muscles intact, oropharynx without lesions Cardiac: irregular rate and rhythm, no rubs, murmurs or gallops Pulm: breathing well, clear to auscultation bilaterally Abd: bowel sounds normal, soft, nondistended, non-tender Ext: warm and well perfused, without pedal edema Skin: half and half nails, palmar erythema  Assessment & Plan:  Case discussed with Dr. Oswaldo Done  Nausea and vomiting I think his progressive nausea and vomiting is from progressive uremia as well as post-cholecystectomy syndrome. Today I checked a stat BMP that indeed showed worsened uremia with a BUN of 120 and his creatinine is now up to 5.3; potassium and bicarbonate levels fortunately looked okay at 4.4 and 20, respectively. I don't think he needs urgent dialysis because he looks so well, is not volume overloaded, his vital signs are normal, he's not hyperkalemic, and not acidotic.  Today, I sent him home and asked him to come back in 2 weeks so we can check up on him. I prescribed ondansetron for his nausea and gave him a list of fatty foods to avoid to help treat him symptomatically. When we discussed the idea of dialysis, he rejected the idea because he had seen his father suffer on the machine during his final days, and he wants to "pass peacefully when the Lord calls me up." He agreed to come back in 2 weeks so we can  check up on him and re-check labs if necessary. He has an appointment with the nephrologist in 3 weeks as well.   Medications Ordered Meds ordered this encounter  Medications  . ondansetron (ZOFRAN) 4 MG tablet    Sig: Take 1 tablet (4 mg total) by mouth every 8 (eight) hours as needed for nausea or vomiting.    Dispense:  30 tablet    Refill:   1   Other Orders Orders Placed This Encounter  Procedures  . BMP w Anion Gap (STAT/Sunquest-performed on-site)   Follow Up: 2 weeks

## 2015-06-01 NOTE — Assessment & Plan Note (Addendum)
I think his progressive nausea and vomiting is from progressive uremia as well as post-cholecystectomy syndrome. Today I checked a stat BMP that indeed showed worsened uremia with a BUN of 120 and his creatinine is now up to 5.3; potassium and bicarbonate levels fortunately looked okay at 4.4 and 20, respectively. I don't think he needs urgent dialysis because he looks so well, is not volume overloaded, his vital signs are normal, he's not hyperkalemic nor acidotic.  Today, I sent him home and asked him to come back in 2 weeks so we can check up on him. I prescribed ondansetron for his nausea and gave him a list of fatty foods to avoid to help treat him symptomatically. When we discussed the idea of dialysis, he rejected the idea because he had seen his father suffer on the machine during his final days, and he wants to "pass peacefully when the Lord calls me up." He agreed to come back in 2 weeks so we can check up on him and re-check labs if necessary. He has an appointment with the nephrologist in 3 weeks as well.

## 2015-06-01 NOTE — Telephone Encounter (Signed)
Walk in, pt states since his gallbladder surgery he has nausea and vomiting  Fr flores into no show slot

## 2015-06-05 NOTE — Progress Notes (Signed)
Internal Medicine Clinic Attending  I saw and evaluated the patient.  I personally confirmed the key portions of the history and exam documented by Dr. Flores and I reviewed pertinent patient test results.  The assessment, diagnosis, and plan were formulated together and I agree with the documentation in the resident's note.  

## 2015-06-08 ENCOUNTER — Inpatient Hospital Stay (HOSPITAL_COMMUNITY)
Admission: AD | Admit: 2015-06-08 | Discharge: 2015-06-19 | DRG: 291 | Disposition: A | Discharge status: Home Health | Payer: Medicare Other | Source: Ambulatory Visit | Attending: Student in an Organized Health Care Education/Training Program | Admitting: Student in an Organized Health Care Education/Training Program

## 2015-06-08 ENCOUNTER — Ambulatory Visit (INDEPENDENT_AMBULATORY_CARE_PROVIDER_SITE_OTHER): Payer: Medicare Other | Admitting: Internal Medicine

## 2015-06-08 ENCOUNTER — Encounter: Payer: Self-pay | Admitting: Internal Medicine

## 2015-06-08 VITALS — BP 110/69 | HR 56 | Temp 97.5°F | Wt 165.6 lb

## 2015-06-08 DIAGNOSIS — E785 Hyperlipidemia, unspecified: Secondary | ICD-10-CM | POA: Diagnosis present

## 2015-06-08 DIAGNOSIS — N184 Chronic kidney disease, stage 4 (severe): Secondary | ICD-10-CM

## 2015-06-08 DIAGNOSIS — I5022 Chronic systolic (congestive) heart failure: Secondary | ICD-10-CM | POA: Diagnosis present

## 2015-06-08 DIAGNOSIS — R112 Nausea with vomiting, unspecified: Secondary | ICD-10-CM | POA: Diagnosis not present

## 2015-06-08 DIAGNOSIS — Z818 Family history of other mental and behavioral disorders: Secondary | ICD-10-CM | POA: Diagnosis not present

## 2015-06-08 DIAGNOSIS — N179 Acute kidney failure, unspecified: Secondary | ICD-10-CM | POA: Diagnosis not present

## 2015-06-08 DIAGNOSIS — I132 Hypertensive heart and chronic kidney disease with heart failure and with stage 5 chronic kidney disease, or end stage renal disease: Principal | ICD-10-CM | POA: Diagnosis present

## 2015-06-08 DIAGNOSIS — Z955 Presence of coronary angioplasty implant and graft: Secondary | ICD-10-CM

## 2015-06-08 DIAGNOSIS — B1920 Unspecified viral hepatitis C without hepatic coma: Secondary | ICD-10-CM | POA: Diagnosis present

## 2015-06-08 DIAGNOSIS — N185 Chronic kidney disease, stage 5: Secondary | ICD-10-CM | POA: Diagnosis not present

## 2015-06-08 DIAGNOSIS — Z8249 Family history of ischemic heart disease and other diseases of the circulatory system: Secondary | ICD-10-CM | POA: Diagnosis not present

## 2015-06-08 DIAGNOSIS — I4892 Unspecified atrial flutter: Secondary | ICD-10-CM | POA: Diagnosis present

## 2015-06-08 DIAGNOSIS — N19 Unspecified kidney failure: Secondary | ICD-10-CM | POA: Diagnosis present

## 2015-06-08 DIAGNOSIS — Z823 Family history of stroke: Secondary | ICD-10-CM

## 2015-06-08 DIAGNOSIS — Z87891 Personal history of nicotine dependence: Secondary | ICD-10-CM | POA: Diagnosis not present

## 2015-06-08 DIAGNOSIS — E039 Hypothyroidism, unspecified: Secondary | ICD-10-CM | POA: Diagnosis present

## 2015-06-08 DIAGNOSIS — R5381 Other malaise: Secondary | ICD-10-CM | POA: Diagnosis not present

## 2015-06-08 DIAGNOSIS — E1122 Type 2 diabetes mellitus with diabetic chronic kidney disease: Secondary | ICD-10-CM | POA: Diagnosis present

## 2015-06-08 DIAGNOSIS — Z89611 Acquired absence of right leg above knee: Secondary | ICD-10-CM | POA: Diagnosis not present

## 2015-06-08 DIAGNOSIS — Z7901 Long term (current) use of anticoagulants: Secondary | ICD-10-CM | POA: Diagnosis not present

## 2015-06-08 DIAGNOSIS — D696 Thrombocytopenia, unspecified: Secondary | ICD-10-CM | POA: Diagnosis present

## 2015-06-08 DIAGNOSIS — Z992 Dependence on renal dialysis: Secondary | ICD-10-CM | POA: Diagnosis not present

## 2015-06-08 DIAGNOSIS — E43 Unspecified severe protein-calorie malnutrition: Secondary | ICD-10-CM | POA: Insufficient documentation

## 2015-06-08 DIAGNOSIS — R5383 Other fatigue: Secondary | ICD-10-CM | POA: Diagnosis not present

## 2015-06-08 DIAGNOSIS — N186 End stage renal disease: Secondary | ICD-10-CM | POA: Insufficient documentation

## 2015-06-08 DIAGNOSIS — E113493 Type 2 diabetes mellitus with severe nonproliferative diabetic retinopathy without macular edema, bilateral: Secondary | ICD-10-CM | POA: Diagnosis present

## 2015-06-08 DIAGNOSIS — Z833 Family history of diabetes mellitus: Secondary | ICD-10-CM

## 2015-06-08 DIAGNOSIS — I10 Essential (primary) hypertension: Secondary | ICD-10-CM

## 2015-06-08 DIAGNOSIS — E8889 Other specified metabolic disorders: Secondary | ICD-10-CM | POA: Diagnosis present

## 2015-06-08 DIAGNOSIS — I482 Chronic atrial fibrillation: Secondary | ICD-10-CM | POA: Diagnosis present

## 2015-06-08 DIAGNOSIS — K5900 Constipation, unspecified: Secondary | ICD-10-CM | POA: Diagnosis present

## 2015-06-08 DIAGNOSIS — Z95828 Presence of other vascular implants and grafts: Secondary | ICD-10-CM

## 2015-06-08 DIAGNOSIS — D631 Anemia in chronic kidney disease: Secondary | ICD-10-CM | POA: Diagnosis present

## 2015-06-08 DIAGNOSIS — Z881 Allergy status to other antibiotic agents status: Secondary | ICD-10-CM

## 2015-06-08 DIAGNOSIS — E1142 Type 2 diabetes mellitus with diabetic polyneuropathy: Secondary | ICD-10-CM | POA: Diagnosis present

## 2015-06-08 DIAGNOSIS — Z6822 Body mass index (BMI) 22.0-22.9, adult: Secondary | ICD-10-CM

## 2015-06-08 DIAGNOSIS — E1151 Type 2 diabetes mellitus with diabetic peripheral angiopathy without gangrene: Secondary | ICD-10-CM | POA: Diagnosis present

## 2015-06-08 DIAGNOSIS — K219 Gastro-esophageal reflux disease without esophagitis: Secondary | ICD-10-CM | POA: Diagnosis present

## 2015-06-08 DIAGNOSIS — Z803 Family history of malignant neoplasm of breast: Secondary | ICD-10-CM

## 2015-06-08 DIAGNOSIS — I2510 Atherosclerotic heart disease of native coronary artery without angina pectoris: Secondary | ICD-10-CM | POA: Diagnosis not present

## 2015-06-08 DIAGNOSIS — Z96642 Presence of left artificial hip joint: Secondary | ICD-10-CM | POA: Diagnosis present

## 2015-06-08 DIAGNOSIS — R634 Abnormal weight loss: Secondary | ICD-10-CM | POA: Diagnosis present

## 2015-06-08 DIAGNOSIS — Z8261 Family history of arthritis: Secondary | ICD-10-CM

## 2015-06-08 DIAGNOSIS — Z888 Allergy status to other drugs, medicaments and biological substances status: Secondary | ICD-10-CM | POA: Diagnosis not present

## 2015-06-08 DIAGNOSIS — R531 Weakness: Secondary | ICD-10-CM

## 2015-06-08 DIAGNOSIS — Z79899 Other long term (current) drug therapy: Secondary | ICD-10-CM

## 2015-06-08 DIAGNOSIS — I25118 Atherosclerotic heart disease of native coronary artery with other forms of angina pectoris: Secondary | ICD-10-CM | POA: Diagnosis present

## 2015-06-08 DIAGNOSIS — Z811 Family history of alcohol abuse and dependence: Secondary | ICD-10-CM | POA: Diagnosis not present

## 2015-06-08 DIAGNOSIS — Z830 Family history of human immunodeficiency virus [HIV] disease: Secondary | ICD-10-CM

## 2015-06-08 DIAGNOSIS — Z419 Encounter for procedure for purposes other than remedying health state, unspecified: Secondary | ICD-10-CM

## 2015-06-08 DIAGNOSIS — I4891 Unspecified atrial fibrillation: Secondary | ICD-10-CM | POA: Diagnosis not present

## 2015-06-08 DIAGNOSIS — B182 Chronic viral hepatitis C: Secondary | ICD-10-CM | POA: Diagnosis not present

## 2015-06-08 LAB — GLUCOSE, CAPILLARY
GLUCOSE-CAPILLARY: 255 mg/dL — AB (ref 65–99)
GLUCOSE-CAPILLARY: 164 mg/dL — AB (ref 65–99)

## 2015-06-08 LAB — BASIC METABOLIC PANEL
Anion gap: 11 (ref 5–15)
BUN: 114 mg/dL — ABNORMAL HIGH (ref 6–20)
CHLORIDE: 101 mmol/L (ref 101–111)
CO2: 21 mmol/L — AB (ref 22–32)
CREATININE: 5.83 mg/dL — AB (ref 0.61–1.24)
Calcium: 8.7 mg/dL — ABNORMAL LOW (ref 8.9–10.3)
GFR calc non Af Amer: 9 mL/min — ABNORMAL LOW (ref >60)
GFR, EST AFRICAN AMERICAN: 10 mL/min — AB (ref >60)
GLUCOSE: 248 mg/dL — AB (ref 65–99)
Potassium: 4.3 mmol/L (ref 3.5–5.1)
Sodium: 133 mmol/L — ABNORMAL LOW (ref 135–145)

## 2015-06-08 LAB — CBC WITH DIFFERENTIAL/PLATELET
BASOS PCT: 0 %
Basophils Absolute: 0 10*3/uL (ref 0.0–0.1)
EOS PCT: 2 %
Eosinophils Absolute: 0.1 10*3/uL (ref 0.0–0.7)
HCT: 37.6 % — ABNORMAL LOW (ref 39.0–52.0)
HEMOGLOBIN: 12.3 g/dL — AB (ref 13.0–17.0)
LYMPHS PCT: 24 %
Lymphs Abs: 1.6 10*3/uL (ref 0.7–4.0)
MCH: 22.6 pg — ABNORMAL LOW (ref 26.0–34.0)
MCHC: 32.7 g/dL (ref 30.0–36.0)
MCV: 69.1 fL — ABNORMAL LOW (ref 78.0–100.0)
Monocytes Absolute: 0.5 10*3/uL (ref 0.1–1.0)
Monocytes Relative: 7 %
NEUTROS ABS: 4.4 10*3/uL (ref 1.7–7.7)
NEUTROS PCT: 67 %
PLATELETS: 79 10*3/uL — AB (ref 150–400)
RBC: 5.44 MIL/uL (ref 4.22–5.81)
RDW: 16.3 % — ABNORMAL HIGH (ref 11.5–15.5)
WBC: 6.6 10*3/uL (ref 4.0–10.5)

## 2015-06-08 LAB — INFLUENZA PANEL BY PCR (TYPE A & B)
H1N1FLUPCR: NOT DETECTED
Influenza A By PCR: NEGATIVE
Influenza B By PCR: NEGATIVE

## 2015-06-08 LAB — HEPATIC FUNCTION PANEL
ALT: 36 U/L (ref 17–63)
AST: 41 U/L (ref 15–41)
Albumin: 2.8 g/dL — ABNORMAL LOW (ref 3.5–5.0)
Alkaline Phosphatase: 57 U/L (ref 38–126)
BILIRUBIN INDIRECT: 0.4 mg/dL (ref 0.3–0.9)
Bilirubin, Direct: 0.4 mg/dL (ref 0.1–0.5)
TOTAL PROTEIN: 7.2 g/dL (ref 6.5–8.1)
Total Bilirubin: 0.8 mg/dL (ref 0.3–1.2)

## 2015-06-08 MED ORDER — CARVEDILOL 12.5 MG PO TABS
12.5000 mg | ORAL_TABLET | Freq: Two times a day (BID) | ORAL | Status: DC
  Administered 2015-06-08 – 2015-06-11 (×5): 12.5 mg via ORAL
  Filled 2015-06-08 (×5): qty 1

## 2015-06-08 MED ORDER — ADULT MULTIVITAMIN W/MINERALS CH
1.0000 | ORAL_TABLET | Freq: Every day | ORAL | Status: DC
  Administered 2015-06-09 – 2015-06-14 (×5): 1 via ORAL
  Filled 2015-06-08 (×5): qty 1

## 2015-06-08 MED ORDER — HEPARIN SODIUM (PORCINE) 5000 UNIT/ML IJ SOLN: 5000.0000 [IU] | Freq: Three times a day (TID) | INTRAMUSCULAR | Status: DC

## 2015-06-08 MED ORDER — FLUTICASONE PROPIONATE 50 MCG/ACT NA SUSP
1.0000 | Freq: Every day | NASAL | Status: DC | PRN
  Filled 2015-06-08: qty 16

## 2015-06-08 MED ORDER — INSULIN ASPART 100 UNIT/ML ~~LOC~~ SOLN
0.0000 [IU] | Freq: Three times a day (TID) | SUBCUTANEOUS | Status: DC
  Administered 2015-06-08: 5 [IU] via SUBCUTANEOUS
  Administered 2015-06-09: 3 [IU] via SUBCUTANEOUS
  Administered 2015-06-10: 1 [IU] via SUBCUTANEOUS
  Administered 2015-06-10 – 2015-06-11 (×2): 2 [IU] via SUBCUTANEOUS
  Administered 2015-06-11: 3 [IU] via SUBCUTANEOUS
  Administered 2015-06-12 (×2): 2 [IU] via SUBCUTANEOUS
  Administered 2015-06-13 – 2015-06-14 (×3): 3 [IU] via SUBCUTANEOUS
  Administered 2015-06-15: 2 [IU] via SUBCUTANEOUS
  Administered 2015-06-15: 3 [IU] via SUBCUTANEOUS
  Administered 2015-06-16 – 2015-06-18 (×4): 1 [IU] via SUBCUTANEOUS
  Administered 2015-06-18 (×2): 2 [IU] via SUBCUTANEOUS

## 2015-06-08 MED ORDER — SODIUM CHLORIDE 0.9 % IV BOLUS (SEPSIS)
500.0000 mL | Freq: Once | INTRAVENOUS | Status: AC
Start: 2015-06-08 — End: 2015-06-08
  Administered 2015-06-08: 500 mL via INTRAVENOUS

## 2015-06-08 MED ORDER — APIXABAN 5 MG PO TABS: 5.0000 mg | ORAL_TABLET | Freq: Two times a day (BID) | ORAL | Status: DC

## 2015-06-08 MED ORDER — FAMOTIDINE 20 MG PO TABS
20.0000 mg | ORAL_TABLET | Freq: Every day | ORAL | Status: DC
  Administered 2015-06-08 – 2015-06-19 (×11): 20 mg via ORAL
  Filled 2015-06-08 (×11): qty 1

## 2015-06-08 MED ORDER — ONDANSETRON HCL 4 MG/2ML IJ SOLN
4.0000 mg | Freq: Four times a day (QID) | INTRAMUSCULAR | Status: DC | PRN
  Administered 2015-06-09: 4 mg via INTRAMUSCULAR
  Filled 2015-06-08: qty 2

## 2015-06-08 MED ORDER — AMIODARONE HCL 200 MG PO TABS
200.0000 mg | ORAL_TABLET | Freq: Every day | ORAL | Status: DC
  Administered 2015-06-09 – 2015-06-19 (×10): 200 mg via ORAL
  Filled 2015-06-08 (×10): qty 1

## 2015-06-08 MED ORDER — LEVOTHYROXINE SODIUM 25 MCG PO TABS
25.0000 ug | ORAL_TABLET | Freq: Every day | ORAL | Status: DC
  Administered 2015-06-09 – 2015-06-10 (×2): 25 ug via ORAL
  Filled 2015-06-08 (×2): qty 1

## 2015-06-08 NOTE — Progress Notes (Signed)
Subjective:    Patient ID: [name] [name], male    DOB: 05/16/1945, 70 y.o.   MRN: [id number]  HPI  [name] [name] is here for follow-up of his acute on chronic renal failure complicated by acute uremia.  [name] [name] presents today with persistent nausea and vomiting associated with weakness and generalized malaise. This has been progressively worsening since his cholecystectomy and currently we believe it secondary to acute uremia from acute on chronic renal failure. He was seen one week ago with these complaints and was noted to have a BUN of 122 with a creatinine of 5.28. Dr. Earnest ConroyFlores discussed with him the possibility of hemodialysis and he refused at that time stating that he saw his father suffer and would not consider hemodialysis.  This morning he notes continued nausea and vomiting with any oral intake. He, his wife, and I spoke for 60 minutes about why he was nauseated and had a decreased appetite and what his potential outcomes and options were. I gave him the following 3 scenarios  1) If he chose to defer hemodialysis I told him he would remain chronically nauseated and with a poor appetite for the rest of his remaining life. I also mentioned that as his uremia worsened he would progressively become more confused, somnolent, and then likely unarousable and peaceably pass away. When I discussed this option I delved into his depression and specifically asked him if he felt it was his time to die. He said that although he has many worries he does not feel he is ready to die and he wants to be here for both his wife and his daughters.  2) If he chose hemodialysis to begin in the very near future it would help manage his acute uremia, and thus improve and likely resolve rather quickly his nausea and decreased appetite. When I discussed this option I tried to explore in more detail about his fears he had about hemodialysis regarding what he saw his father go through. As he thought about it  there was nothing specific about the dialysis other than how time-consuming it was in how it had prolonged his father's suffering.  3) If he chose dialysis I also gave him the option of stopping it whenever he decided. If he found the burdens of dialysis outweigh the benefits in how he felt or did not return him to a level of function that he was looking for that he could stop it at any time understanding that he would eventually develop a decreased appetite, worsening nausea, and then confusion and somnolence before peacefully passing away.  As we discussed the scenarios amongst the 3 of us he decided to give dialysis a try to see if that would help him feel better with regards to his nausea, appetite and energy. He stated he no longer wanted to feel as bad as he does now and was hopeful that hemodialysis could change that.  With that decision in hand I told him that I felt that he would qualify for an inpatient admission and initiation of dialysis given the symptomatic uremia manifested by his persistent nausea and vomiting. He was in agreement that admission today would be appropriate to start this therapy. Throughout these discussions his wife was very supportive and tried to alleviate any concerns he had about copayments and transportation to hemodialysis.  When we called for a regular non-telemetry bed we were told there were none available in the hospital currently. There were approximately 22 people ahead of  him and they anticipated the earliest a bed may become available was at 5 PM. When given this information he and his wife decided to go home and wait for a phone call when a bed became available.  They would return for a direct admission to the internal medicine teaching service.  Given the importance of the above discussion with regards to his acute uremia and acute renal failure we did not address any of his other chronic medical conditions during this appointment. We did obtain a basic  metabolic panel which was notable for a potassium of 4.3, BUN of 114 and a creatinine of 5.83. The potassium and BUN were stable but the creatinine was up from 5.28 one week ago. Fortunately, he did not have an anion gap which was only 11 with a bicarbonate of 21. A CBC was also obtained and demonstrated a normal white count with a hemoglobin of 12.3 and platelets of 79.  Review of Systems  Constitutional: Positive for activity change, appetite change, fatigue and unexpected weight change.  Respiratory: Negative for chest tightness and shortness of breath.   Cardiovascular: Negative for chest pain, palpitations and leg swelling.  Gastrointestinal: Positive for nausea and vomiting. Negative for abdominal pain.  Neurological: Positive for weakness.  Psychiatric/Behavioral: Positive for confusion, sleep disturbance, dysphoric mood and decreased concentration.      Objective:   Physical Exam  Constitutional: He is oriented to person, place, and time. He appears well-developed. No distress.  HENT:  Head: Normocephalic and atraumatic.  Eyes: Conjunctivae are normal. Right eye exhibits no discharge. Left eye exhibits no discharge. No scleral icterus.  Cardiovascular: Normal rate, regular rhythm and normal heart sounds.  Exam reveals no gallop and no friction rub.   No murmur heard. Pulmonary/Chest: Effort normal and breath sounds normal. No respiratory distress. He has no wheezes. He has no rales.  Abdominal: Soft. Bowel sounds are normal. He exhibits no distension. There is no tenderness. There is no rebound and no guarding.  Musculoskeletal: Normal range of motion. He exhibits no edema or tenderness.  s/p R BKA  Neurological: He is alert and oriented to person, place, and time. He exhibits normal muscle tone.  Skin: Skin is warm and dry. No rash noted. He is not diaphoretic. No erythema.  Psychiatric: He has a normal mood and affect. His behavior is normal. Judgment and thought content normal.    Nursing note and vitals reviewed.     Assessment & Plan:   Please see problem oriented charting.

## 2015-06-08 NOTE — Assessment & Plan Note (Signed)
Assessment  His chronic stage IV kidney disease secondary to diabetes and hypertension has progressed to the point of symptomatic uremia requiring hemodialysis for control.  Plan  He will be admitted to the internal medicine teaching service for acute dialysis and initiation of chronic dialysis in the community now that he is willing to undergo this therapy.

## 2015-06-08 NOTE — H&P (Signed)
Date: 06/08/2015               Patient Name:  [name] [name] MRN: [id number]  DOB: November 28, 1945 Age / Sex: 70 y.o., male   PCP: Doneen Poisson, MD         Medical Service: Internal Medicine Teaching Service         Attending Physician: Dr. Levert Feinstein, MD    First Contact: Dr. Orest Dikes Pager: [phone]  Second Contact: Dr. Gara Kroner Pager: [phone]       After Hours (After 5p/  First Contact Pager: [phone]  weekends / holidays): Second Contact Pager: [phone]   Chief Complaint: Nausea and vomiting, weakness and malaise  History of Present Illness: [name] [name] is a 70yo with stage 4 CKD, CHF (EF 50% May 2016) Atrial flutter, HTN, T2DM previously on insulin s/p R BKA, CAD (RCA DES in 2006), HCV (untreated, normal RUQ [place]) who presents with nausea and vomiting as well as weakness and generalized malaise, particularly worse over the past 2 weeks but gradually worsening over the past 6 months, since his cholecystectomy in September 2016. He says the N/V occurs primarily after eating, but is not associated with stomach pain, fullness, or other symptoms. He denies fevers, chest pain, palpitations, cough, shortness of breath, abdominal pain/distension, urinary symptoms or other issues at this time.  He has been seen twice in our clinic in the past week, first on 3/10 and again today for worsening uremia that is likely causing his symptoms, as he has initially been resistant to starting dialysis. However, he was agreeable to start today and was admitted from our clinic.  Meds: Current Facility-Administered Medications  Medication Dose Route Frequency Provider Last Rate Last Dose  . [START ON 06/09/2015] amiodarone (PACERONE) tablet 200 mg  200 mg Oral Daily Denton Brick, MD      . apixaban Everlene Balls) tablet 5 mg  5 mg Oral BID Denton Brick, MD      . carvedilol (COREG) tablet 12.5 mg  12.5 mg Oral BID WC Denton Brick, MD      . famotidine (PEPCID) tablet 20 mg  20 mg Oral Daily  Denton Brick, MD      . fluticasone [facility]) 50 MCG/ACT nasal spray 1 spray  1 spray Each Nare Daily PRN Denton Brick, MD      . Melene Muller ON 06/09/2015] levothyroxine (SYNTHROID, LEVOTHROID) tablet 25 mcg  25 mcg Oral QAC breakfast Denton Brick, MD      . Melene Muller ON 06/09/2015] multivitamin with minerals tablet 1 tablet  1 tablet Oral Daily Denton Brick, MD      . ondansetron [facility]) injection 4 mg  4 mg Intramuscular Q6H PRN Denton Brick, MD        Allergies: Allergies as of 06/08/2015 - Review Complete 06/08/2015  Allergen Reaction Noted  . Amoxicillin Swelling and Other (See Comments) 04/21/2012  . Lisinopril Other (See Comments) 11/22/2014  . Pravastatin Other (See Comments) 05/26/2014  . Tamsulosin Other (See Comments)   . Doxycycline Rash and Other (See Comments) 12/02/2011   Past Medical History  Diagnosis Date  . Hyperlipidemia 04/02/2006  . Essential hypertension 12/09/2006  . Benign prostatic hypertrophy with nocturia 07/07/2006  . Chronic venous insufficiency 04/12/2010  . Obesity (BMI 30.0-34.9) 01/15/2012  . Constipation 05/25/2009    Intermittent   . Microcytic normochromic anemia 05/27/2006  . Internal and external hemorrhoids without complication 08/20/2012  . Coronary artery disease 04/02/2006  s/p RCA DES 2006, low risk myoview in 2011  . 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 Type 2 diabetes mellitus with peripheral artery disease (HCC) 05/27/2006    Absent pulses in the left foot   . Closed displaced fracture of left femoral neck (HCC) 06/14/2014    s/p left hip hemiarthroplasty June 14, 2014   . Atrial flutter (HCC) 07/26/2014    a. May 2016, CHA2DS2VASc = 4 -> Eliquis, spontaneous conversion to NSR;  b. On amio;  c. 01/2015 EPS:  Unable to induce right sided Aflutter. Non-sustained Afib and Left sided Aflutter noted.  [name]. GERD (gastroesophageal reflux disease)   . Type 2 diabetes mellitus with stage 4 chronic kidney disease (HCC) 08/06/2012  . History of blood transfusion 05/2014    "related to hip OR"  . Degenerative joint disease involving multiple joints 02/02/2007  . Arthritis     "fingers" (02/07/2015)  . Nausea and vomiting 06/01/2015   Past Surgical History  Procedure Laterality Date  . Pilonidal cyst excision  1990's  . Refractive surgery Bilateral   . Fracture surgery    . Total hip arthroplasty Left 06/14/2014    Procedure: HEMI HIP ARTHROPLASTY ANTERIOR APPROACH;  Surgeon: Tarry KosNaiping M Xu, MD;  Location: [facility] OR;  Service: Orthopedics;  Laterality: Left;  . Joint replacement    . Leg amputation above knee Right ~ 2008  . Cholecystectomy N/A 12/12/2014    Procedure: LAPAROSCOPIC CHOLECYSTECTOMY WITH INTRAOPERATIVE CHOLANGIOGRAM;  Surgeon: Gaynelle AduEric Wilson, MD;  [facility];  Service: General;  Laterality: N/A;  . Electrophysiologic study N/A 02/07/2015    Procedure: A-Flutter Ablation;  Surgeon: Marinus MawGregg W Taylor, MD;  [facility];  Service: Cardiovascular;  Laterality: N/A;  . Tonsillectomy    . Coronary angioplasty with stent placement    . Prostate biopsy  ~ 2013   Family History  Problem Relation Age of Onset  . Hypertension Mother   . Heart attack Father   . Breast cancer Sister   . Arthritis Sister   . Heart attack Brother   . Diabetes Brother   . Stroke Brother   . Pneumonia Daughter   . Diabetes Brother   . Alcoholism Brother   . Diabetes Brother   . Arthritis Sister     Bilateral knee replacement  . HIV Daughter   . Hypertension Daughter   . Drug abuse Daughter   . Schizophrenia Daughter   . Hypertension Daughter   . Drug abuse Daughter   . Bipolar disorder Daughter    Social History   Social History  . Marital Status: Married    Spouse Name: N/A  . Number of  Children: N/A  . Years of Education: N/A   Occupational History  . Not on file.   Social History Main Topics  . Smoking status: Former Smoker -- 1.00 packs/day for 10 years    Types: Cigarettes    Quit date: 06/16/1968  . Smokeless tobacco: Never Used  . Alcohol Use: No     Comment: "quit alcohol in the 1960's"  . Drug Use: No  . Sexual Activity: Not Currently    Birth Control/ Protection: None   Other Topics Concern  . Not on file   Social History Narrative  Review of Systems: Pertinent items noted in HPI and remainder of comprehensive ROS otherwise negative.  Physical Exam: Blood pressure 135/70, pulse 55, temperature 97.8 F (36.6 C), temperature source Oral, resp. rate 22, SpO2 100 %.   Gen: Well-appearing, alert and oriented to person, place, and time. Uremic fetor noted. HEENT: Oropharynx clear without erythema or exudate.  Neck: No cervical LAD, no thyromegaly or nodules, no JVD noted. CV: Normal rate, regular rhythm, no murmurs, rubs, or gallops Pulmonary: Normal effort, CTA bilaterally, no crackles or wheezes Abdominal: Soft, non-tender, non-distended, without rebound, guarding, or masses Extremities: Distal pulses 2+ in upper and lower extremities, no tenderness, erythema or edema. S/p R BKA Neuro: CN II-XII grossly intact, no focal weakness or sensory deficits noted Skin: No atypical appearing moles. No rashes. Poor skin turgor.  Lab results: Basic Metabolic Panel:  Recent Labs  16/10/96 1141  NA 133*  K 4.3  CL 101  CO2 21*  GLUCOSE 248*  BUN 114*  CREATININE 5.83*  CALCIUM 8.7*   CBC:  Recent Labs  06/08/15 1141  WBC 6.6  NEUTROABS 4.4  HGB 12.3*  HCT 37.6*  MCV 69.1*  PLT 79*   CBG:  Recent Labs  06/08/15 1655  GLUCAP 255*   Assessment & Plan by Problem: 1. Symptomatic uremia - N/V, worsening malaise/weakness all most likely a/w uremia. Acute worsening of chronic diabetic and hypertensive nephropathy. BUN 114 here, Cr 5.8,  also with a microcytic anemia most likely attributable to anemia of chronic kidney disease, with a rising Hgb (12 here vs 8-10 baseline) which may reflect hemoconcentration from dehydration, as well as thrombocytopenia. -Nephrology on board; plans to start HD tomorrow -NPO at midnight for cath placement. Will get fistula in ~1 week -Zofran IV PRN for nausea -Continue home pepcid -Trend renal function panel, CBC -Telemetry  2. Atrial Flutter: Unsuccessful ablation 01/2015. Did not follow-up with Dr. Jens Som afterward - Continue home amiodarone and coreg -Continue home eliquis  3. CAD: RCA DES in 2006 - Continue coreg and statin  4. Hypothyroidism -Continue synthroid  DVT Prophylaxis: Eliquis Code Status: Full  Dispo: Disposition is deferred at this time, awaiting improvement of current medical problems. Anticipated discharge in approximately 2-3 day(s).   The patient does have a current PCP Doneen Poisson, MD) and does need an [facility] hospital follow-up appointment after discharge.  The patient does not have transportation limitations that hinder transportation to clinic appointments.  Signed: Darrick Huntsman, MD 06/08/2015, 6:17 PM

## 2015-06-08 NOTE — Progress Notes (Signed)
Patient has arrived to the floor. Awaiting orders.

## 2015-06-08 NOTE — Patient Instructions (Signed)
It was wonderful to see you again today as always.  I am glad we had the time to discuss the issue of hemodialysis with you.  I know it is something you are very scared about.  That is normal and understandable.  I think your desire to feel better will be well served by hemodialysis.  If you find the burdens outweigh the benefits or you do not return to the level of activity you desire you can always stop it.  Please return to the hospital this evening when you are called when a bed becomes available.  You will be admitted to my partners on the internal medicine teaching service.  They will likely consult Nephrology tomorrow morning so you can start hemodialysis for your nausea and vomiting.  They will also begin setting up chronic hemodialysis for you in the community.  I hope to see you once you are discharged from the hospital.

## 2015-06-08 NOTE — Consult Note (Signed)
He has stage V CKD with refusal to prepare for dialysis.  He has been seen by Dr. Kathrene BongoGoldsborough in the past.  He had a cholecystectomy several months ago and had some improvement in GI s/s but reports worsening postprandial vomiting.  He was seen by Dr. Josem KaufmannKlima and convinced to initiate dialysis and thus admitted for treatment.  He is a known diabetic. There has been significant weight loss.  Past Medical History  Diagnosis Date  . Hyperlipidemia 04/02/2006  . Essential hypertension 12/09/2006  . Benign prostatic hypertrophy with nocturia 07/07/2006  . Chronic venous insufficiency 04/12/2010  . Obesity (BMI 30.0-34.9) 01/15/2012  . Constipation 05/25/2009    Intermittent   . Microcytic normochromic anemia 05/27/2006  . Internal and external hemorrhoids without complication 08/20/2012  . Coronary artery disease 04/02/2006    s/p RCA DES 2006, low risk myoview in 2011  . 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 Type 2 diabetes mellitus with peripheral artery disease (HCC) 05/27/2006    Absent pulses in the left foot   . Closed displaced fracture of left femoral neck (HCC) 06/14/2014    s/p left hip hemiarthroplasty June 14, 2014   . Atrial flutter (HCC) 07/26/2014    a. May 2016, CHA2DS2VASc = 4 -> Eliquis, spontaneous conversion to NSR;  b. On amio;  c. 01/2015 EPS: Unable to induce right sided Aflutter. Non-sustained Afib and Left sided Aflutter noted.  [name]. GERD (gastroesophageal reflux disease)   . Type 2 diabetes mellitus with stage 4 chronic kidney disease (HCC) 08/06/2012  . History of blood transfusion 05/2014    "related to hip OR"  . Degenerative joint disease involving multiple joints 02/02/2007  . Arthritis     "fingers" (02/07/2015)  . Nausea and vomiting 06/01/2015    Past Surgical History  Procedure Laterality Date  . Pilonidal cyst excision  1990's  . Refractive surgery Bilateral   . Fracture surgery    . Total hip arthroplasty Left 06/14/2014    Procedure: HEMI HIP ARTHROPLASTY ANTERIOR APPROACH;  Surgeon: Tarry KosNaiping M Xu, MD;  Location: [facility] OR;  Service: Orthopedics;  Laterality: Left;  . Joint replacement    . Leg amputation above knee Right ~ 2008  . Cholecystectomy N/A 12/12/2014    Procedure: LAPAROSCOPIC CHOLECYSTECTOMY WITH INTRAOPERATIVE CHOLANGIOGRAM;  Surgeon: Gaynelle AduEric Wilson, MD;  [facility];  Service: General;  Laterality: N/A;  . Electrophysiologic study N/A 02/07/2015    Procedure: A-Flutter Ablation;  Surgeon: Marinus MawGregg W Taylor, MD;  [facility];  Service: Cardiovascular;  Laterality: N/A;  . Tonsillectomy    . Coronary angioplasty with stent placement    . Prostate biopsy  ~ 2013   Social History:  reports that he quit smoking about 47 years ago. His smoking use included Cigarettes. He has a 10 pack-year smoking history. He has never used smokeless tobacco. He reports that he does not drink alcohol or use illicit drugs. Allergies:  Allergies  Allergen Reactions  . Amoxicillin Swelling and Other (See Comments)    Puffy eyes and abdominal pain with Amoxicillin PO  . Lisinopril Other (See Comments)    Acute kidney injury  . Pravastatin Other (See Comments)    Weakness and fatigue  . Tamsulosin Other (See Comments)    REACTION:  dry throat, sweating, blurred vision  . Doxycycline Rash and Other (See Comments)    Questionable drug rxn rash   Family History  Problem Relation Age of Onset  . Hypertension Mother   . Heart attack Father   . Breast cancer Sister   . Arthritis Sister   . Heart attack Brother   . Diabetes Brother   . Stroke Brother   . Pneumonia Daughter   . Diabetes Brother   . Alcoholism Brother   . Diabetes Brother   . Arthritis Sister     Bilateral knee replacement  . HIV Daughter   .  Hypertension Daughter   . Drug abuse Daughter   . Schizophrenia Daughter   . Hypertension Daughter   . Drug abuse Daughter   . Bipolar disorder Daughter     Medications:  Prior to Admission:  Prescriptions prior to admission  Medication Sig Dispense Refill Last Dose  . amiodarone (PACERONE) 200 MG tablet Take 1 tablet (200 mg total) by mouth daily. 90 tablet 3 Taking  . amLODipine (NORVASC) 5 MG tablet Take 1 tablet (5 mg total) by mouth daily. 30 tablet 6 Taking  . calcium citrate-vitamin D (CITRACAL+D) 315-200 MG-UNIT per tablet Take 1 tablet by mouth daily.    Taking  . carvedilol (COREG) 12.5 MG tablet Take 1 tablet (12.5 mg total) by mouth 2 (two) times daily with a meal. 180 tablet 3 Taking  . Darbepoetin Alfa (ARANESP) 100 MCG/0.5ML SOSY injection Inject 0.5 mLs (100 mcg total) into the skin every 7 (seven) days. 4.2 mL 8 Taking  . docusate sodium (COLACE) 100 MG capsule Take 100 mg by mouth at bedtime as needed for moderate constipation (constipation). Reported on 04/20/2015   Taking  . ELIQUIS 5 MG TABS tablet Take 5 mg by mouth 2 (two) times daily.   Taking  . famotidine (PEPCID) 20 MG tablet Take 20 mg before breakfast and 20 mg before supper 60 tablet 6 Taking  . fish oil-omega-3 fatty acids 1000 MG capsule Take 1 g by mouth daily.   Taking  . fluticasone (FLONASE) 50 MCG/ACT nasal spray Place 1 spray into both nostrils daily as needed for allergies. 16 g 3 Taking  . furosemide (LASIX) 20 MG tablet Take 3 tablets (60 mg total) by mouth daily. 90 tablet 3 Taking  . hydrALAZINE (APRESOLINE) 10 MG tablet Take 1 tablet (10 mg total) by mouth every 8 (eight) hours. 90 tablet 1 Taking  . hydroxypropyl methylcellulose (ISOPTO TEARS) 2.5 % ophthalmic solution Place 2 drops into both eyes 4 (four) times daily as needed for dry eyes. Reported on 04/20/2015   Taking  . isosorbide mononitrate (IMDUR) 30 MG 24 hr tablet Take 1 tablet (30 mg total) by mouth daily. 90 tablet 3   . levothyroxine  (SYNTHROID, LEVOTHROID) 25 MCG tablet Take 1 tablet (25 mcg total) by mouth daily before breakfast. 90 tablet 3   . Multiple Vitamins-Minerals (MULTIVITAMIN WITH MINERALS) tablet Take 1 tablet by mouth daily.   Taking  . ondansetron (ZOFRAN) 4 MG tablet Take 1 tablet (4 mg total) by mouth every 8 (eight) hours as needed for nausea or vomiting. 30 tablet 1   . terazosin (HYTRIN) 5 MG capsule Take 1 capsule (5 mg total) by mouth at bedtime. 90 capsule 3    Scheduled: . [START ON 06/09/2015] amiodarone  200 mg Oral Daily  . apixaban  5 mg Oral BID  . carvedilol  12.5 mg Oral BID WC  . famotidine  20 mg Oral Daily  . [START ON 06/09/2015] levothyroxine  25 mcg Oral QAC breakfast  . [START ON 06/09/2015] multivitamin with minerals  1 tablet Oral Daily   ROS: as per HPI  Blood pressure 135/70, pulse 55, temperature 97.8 F (36.6 C), temperature source Oral, resp. rate 22, SpO2 100 %.  Uremic fetor General appearance: alert and cooperative  Vomiting in front of me, thin makle Head: Normocephalic, without obvious abnormality, atraumatic GI: soft Extremities right BKA Skin: reduced turgor Neurologic:not focal , no asterixis  Results for orders placed or performed during the hospital encounter of 06/08/15 (from the past 48 hour(s))  Glucose, capillary     Status: Abnormal   Collection Time: 06/08/15  4:55 PM  Result Value Ref Range   Glucose-Capillary 255 (H) 65 - 99 mg/dL   No results found.  Assessment: 1 ESRD with uremic syndrome 2 Nausea and vomiting likely uremia, but cannot r/o gastroparesis 3 Weight loss due to 1 & 2 4 CM EF 25-30% 5 Hep C pos viral load 6 P A flutter 7 Diabetes Mellitus w complications Plan: 1 Place [facility] in AM 2 Dialysis Saturday 3 Arrange for AV access next week  Matej Sappenfield C 06/08/2015, 6:22 PM

## 2015-06-08 NOTE — Assessment & Plan Note (Addendum)
Assessment  [name] [name] has had nausea and vomiting with generalized malaise and weakness which I believe is associated with his acute uremia.  Plan  At this point, he requires hemodialysis in order to clear his acute uremia and improve his symptoms. He will be a direct admission to the internal medicine teaching service for inpatient nephrology consultation and initiation of hemodialysis to control his acute symptomatic uremia. During the hospital admission he will require placement for chronic hemodialysis which he is now interested in initiating. I appreciate both the internal medicine teaching service and nephrology's help in setting up chronic hemodialysis for [name] [name] given the progression of his renal failure to symptomatic uremia.

## 2015-06-09 ENCOUNTER — Inpatient Hospital Stay (HOSPITAL_COMMUNITY): Payer: Medicare Other

## 2015-06-09 ENCOUNTER — Encounter (HOSPITAL_COMMUNITY): Payer: Self-pay | Admitting: *Deleted

## 2015-06-09 DIAGNOSIS — Z7901 Long term (current) use of anticoagulants: Secondary | ICD-10-CM

## 2015-06-09 DIAGNOSIS — Z992 Dependence on renal dialysis: Secondary | ICD-10-CM

## 2015-06-09 DIAGNOSIS — I4892 Unspecified atrial flutter: Secondary | ICD-10-CM

## 2015-06-09 DIAGNOSIS — N186 End stage renal disease: Secondary | ICD-10-CM

## 2015-06-09 DIAGNOSIS — Z79899 Other long term (current) drug therapy: Secondary | ICD-10-CM

## 2015-06-09 DIAGNOSIS — E039 Hypothyroidism, unspecified: Secondary | ICD-10-CM

## 2015-06-09 DIAGNOSIS — D696 Thrombocytopenia, unspecified: Secondary | ICD-10-CM | POA: Insufficient documentation

## 2015-06-09 DIAGNOSIS — I2510 Atherosclerotic heart disease of native coronary artery without angina pectoris: Secondary | ICD-10-CM

## 2015-06-09 LAB — RENAL FUNCTION PANEL
ANION GAP: 11 (ref 5–15)
Albumin: 2.7 g/dL — ABNORMAL LOW (ref 3.5–5.0)
BUN: 109 mg/dL — ABNORMAL HIGH (ref 6–20)
CO2: 21 mmol/L — AB (ref 22–32)
Calcium: 8.6 mg/dL — ABNORMAL LOW (ref 8.9–10.3)
Chloride: 103 mmol/L (ref 101–111)
Creatinine, Ser: 5.59 mg/dL — ABNORMAL HIGH (ref 0.61–1.24)
GFR calc Af Amer: 11 mL/min — ABNORMAL LOW (ref >60)
GFR calc non Af Amer: 9 mL/min — ABNORMAL LOW (ref >60)
GLUCOSE: 92 mg/dL (ref 65–99)
POTASSIUM: 4.7 mmol/L (ref 3.5–5.1)
Phosphorus: 4.2 mg/dL (ref 2.5–4.6)
SODIUM: 135 mmol/L (ref 135–145)

## 2015-06-09 LAB — CBC
HEMATOCRIT: 35.8 % — AB (ref 39.0–52.0)
HEMOGLOBIN: 11.9 g/dL — AB (ref 13.0–17.0)
MCH: 23 pg — AB (ref 26.0–34.0)
MCHC: 33.2 g/dL (ref 30.0–36.0)
MCV: 69.1 fL — AB (ref 78.0–100.0)
Platelets: 93 10*3/uL — ABNORMAL LOW (ref 150–400)
RBC: 5.18 MIL/uL (ref 4.22–5.81)
RDW: 16.5 % — ABNORMAL HIGH (ref 11.5–15.5)
WBC: 7.3 10*3/uL (ref 4.0–10.5)

## 2015-06-09 LAB — GLUCOSE, CAPILLARY
GLUCOSE-CAPILLARY: 114 mg/dL — AB (ref 65–99)
GLUCOSE-CAPILLARY: 203 mg/dL — AB (ref 65–99)
Glucose-Capillary: 112 mg/dL — ABNORMAL HIGH (ref 65–99)

## 2015-06-09 LAB — TSH: TSH: 10.224 u[IU]/mL — ABNORMAL HIGH (ref 0.350–4.500)

## 2015-06-09 MED ORDER — LIDOCAINE HCL 1 % IJ SOLN
INTRAMUSCULAR | Status: DC | PRN
  Administered 2015-06-09: 20 mL

## 2015-06-09 MED ORDER — SODIUM CHLORIDE 0.9 % IV SOLN: 100.0000 mL | INTRAVENOUS | Status: DC | PRN

## 2015-06-09 MED ORDER — LIDOCAINE HCL (PF) 1 % IJ SOLN: 5.0000 mL | INTRAMUSCULAR | Status: DC | PRN

## 2015-06-09 MED ORDER — LIDOCAINE HCL 1 % IJ SOLN
INTRAMUSCULAR | Status: AC
  Filled 2015-06-09: qty 20

## 2015-06-09 MED ORDER — HEPARIN SODIUM (PORCINE) 1000 UNIT/ML DIALYSIS: 20.0000 [IU]/kg | INTRAMUSCULAR | Status: DC | PRN

## 2015-06-09 MED ORDER — HEPARIN 1000 UNIT/ML FOR PERITONEAL DIALYSIS
INTRAMUSCULAR | Status: DC | PRN
  Administered 2015-06-09 (×2): 1.4 mL via INTRAPERITONEAL

## 2015-06-09 MED ORDER — ALTEPLASE 2 MG IJ SOLR: 2.0000 mg | Freq: Once | INTRAMUSCULAR | Status: DC | PRN

## 2015-06-09 MED ORDER — ONDANSETRON HCL 4 MG/2ML IJ SOLN
4.0000 mg | Freq: Four times a day (QID) | INTRAMUSCULAR | Status: DC | PRN
  Administered 2015-06-09 – 2015-06-12 (×4): 4 mg via INTRAVENOUS
  Filled 2015-06-09 (×4): qty 2

## 2015-06-09 MED ORDER — LIDOCAINE-PRILOCAINE 2.5-2.5 % EX CREA: 1.0000 "application " | TOPICAL_CREAM | CUTANEOUS | Status: DC | PRN

## 2015-06-09 MED ORDER — IPRATROPIUM-ALBUTEROL 0.5-2.5 (3) MG/3ML IN SOLN: 3.0000 mL | Freq: Once | RESPIRATORY_TRACT | Status: DC

## 2015-06-09 MED ORDER — HEPARIN SODIUM (PORCINE) 1000 UNIT/ML DIALYSIS: 1000.0000 [IU] | INTRAMUSCULAR | Status: DC | PRN

## 2015-06-09 MED ORDER — HEPARIN SODIUM (PORCINE) 1000 UNIT/ML IJ SOLN
INTRAMUSCULAR | Status: AC
  Filled 2015-06-09: qty 1

## 2015-06-09 MED ORDER — PENTAFLUOROPROP-TETRAFLUOROETH EX AERO: 1.0000 "application " | INHALATION_SPRAY | CUTANEOUS | Status: DC | PRN

## 2015-06-09 NOTE — Progress Notes (Signed)
Patient ID: [name] [name], male   DOB: 05/03/1945, 70 y.o.   MRN: [id number]    Referring Physician(s): Dr. Casimiro NeedleAlvin Powell   Supervising Physician: Richarda OverlieHenn, Adam  Chief Complaint: Acute renal failure, needs HD  Subjective: Patient is here in the hospital and has Stage V CKD and is now in need of HD.  He is on eliquis.  We have been asked to see him for a temp HD cath.  Allergies: Amoxicillin; Lisinopril; Pravastatin; Tamsulosin; and Doxycycline  Medications: Prior to Admission medications   Medication Sig Start Date End Date Taking? Authorizing Provider  amiodarone (PACERONE) 200 MG tablet Take 1 tablet (200 mg total) by mouth daily. 01/19/15   Doneen PoissonLawrence Klima, MD  amLODipine (NORVASC) 5 MG tablet Take 1 tablet (5 mg total) by mouth daily. 04/16/15   Rhonda G Barrett, PA-C  calcium citrate-vitamin D (CITRACAL+D) 315-200 MG-UNIT per tablet Take 1 tablet by mouth daily.     Historical Provider, MD  carvedilol (COREG) 12.5 MG tablet Take 1 tablet (12.5 mg total) by mouth 2 (two) times daily with a meal. 01/19/15   Doneen PoissonLawrence Klima, MD  Darbepoetin Alfa (ARANESP) 100 MCG/0.5ML SOSY injection Inject 0.5 mLs (100 mcg total) into the skin every 7 (seven) days. 04/13/15   Jana HalfNicholas A Taylor, MD  docusate sodium (COLACE) 100 MG capsule Take 100 mg by mouth at bedtime as needed for moderate constipation (constipation). Reported on 04/20/2015    Historical Provider, MD  ELIQUIS 5 MG TABS tablet Take 5 mg by mouth 2 (two) times daily. 01/15/15   Historical Provider, MD  famotidine (PEPCID) 20 MG tablet Take 20 mg before breakfast and 20 mg before supper 04/16/15   Rhonda G Barrett, PA-C  fish oil-omega-3 fatty acids 1000 MG capsule Take 1 g by mouth daily. 01/15/12   Doneen PoissonLawrence Klima, MD  fluticasone (FLONASE) 50 MCG/ACT nasal spray Place 1 spray into both nostrils daily as needed for allergies. 01/19/15   Doneen PoissonLawrence Klima, MD  furosemide (LASIX) 20 MG tablet Take 3 tablets (60 mg total) by mouth daily. 04/10/15    Jana HalfNicholas A Taylor, MD  hydrALAZINE (APRESOLINE) 10 MG tablet Take 1 tablet (10 mg total) by mouth every 8 (eight) hours. 04/02/15   Ruben ImJeremy Ford, MD  hydroxypropyl methylcellulose (ISOPTO TEARS) 2.5 % ophthalmic solution Place 2 drops into both eyes 4 (four) times daily as needed for dry eyes. Reported on 04/20/2015    Historical Provider, MD  isosorbide mononitrate (IMDUR) 30 MG 24 hr tablet Take 1 tablet (30 mg total) by mouth daily. 05/29/15   Doneen PoissonLawrence Klima, MD  levothyroxine (SYNTHROID, LEVOTHROID) 25 MCG tablet Take 1 tablet (25 mcg total) by mouth daily before breakfast. 06/01/15   Doneen PoissonLawrence Klima, MD  Multiple Vitamins-Minerals (MULTIVITAMIN WITH MINERALS) tablet Take 1 tablet by mouth daily.    Historical Provider, MD  ondansetron (ZOFRAN) 4 MG tablet Take 1 tablet (4 mg total) by mouth every 8 (eight) hours as needed for nausea or vomiting. 06/01/15   Selina CooleyKyle Flores, MD  terazosin (HYTRIN) 5 MG capsule Take 1 capsule (5 mg total) by mouth at bedtime. 05/29/15   Doneen PoissonLawrence Klima, MD    Vital Signs: BP 134/80 mmHg  Pulse 63  Temp(Src) 98.7 F (37.1 C) (Oral)  Resp 17  SpO2 99%  Physical Exam: Heart: regular Lungs: CTAB  Imaging: No results found.  Labs:  CBC:  Recent Labs  03/30/15 0555 03/31/15 0538  05/11/15 0957 06/01/15 0946 06/08/15 1141 06/09/15 0628  WBC 5.1 4.9  --   --   --  6.6 7.3  HGB 8.5* 8.7*  < > 12.4* 12.1* 12.3* 11.9*  HCT 26.7* 27.4*  --   --   --  37.6* 35.8*  PLT 115* 148*  --   --   --  79* 93*  < > = values in this interval not displayed.  COAGS:  Recent Labs  06/13/14 2210 07/25/14 1245 12/11/14 0430 12/12/14 0506  INR 1.16 1.21 1.67* 1.39  APTT  --   --  42* 39*    BMP:  Recent Labs  04/10/15 1538 06/01/15 1129 06/08/15 1141 06/09/15 0628  NA 139 135 133* 135  K 4.4 4.4 4.3 4.7  CL 103 101 101 103  CO2 18 21* 21* 21*  GLUCOSE 160* 248* 248* 92  BUN 84* 122* 114* 109*  CALCIUM 9.7 8.6* 8.7* 8.6*  CREATININE 4.46* 5.28* 5.83* 5.59*    GFRNONAA 12* 10* 9* 9*  GFRAA 14* 11* 10* 11*    LIVER FUNCTION TESTS:  Recent Labs  12/10/14 1052 12/11/14 0430 12/12/14 0506  04/01/15 0459 04/02/15 0720 06/08/15 1919 06/09/15 0628  BILITOT 0.5 0.5 0.3  --   --   --  0.8  --   AST 59* 28 27  --   --   --  41  --   ALT 24 20 16*  --   --   --  36  --   ALKPHOS 66 63 65  --   --   --  57  --   PROT 6.9 6.1* 6.1*  --   --   --  7.2  --   ALBUMIN 2.6* 2.4*  2.4* 2.4*  < > 2.8* 2.9* 2.8* 2.7*  < > = values in this interval not displayed.  Assessment and Plan: 1. Stage V CKD, needs HD  -will plan to place a temp HD cath today due to the patient being on eliquis.  I have discussed this with Dr. Lowella Dandy as well as Dr. Lowell Guitar.  Due to his eliquis, we will not be able to place a tunneled catheter.  We will proceed with a temp.  Dr. Lowell Guitar is going to d/w vascular about converting this to a tunneled in the OR this upcoming week when doing his AV fistula access.  Electronically Signed: Letha Cape 06/09/2015, 11:00 AM   I spent a total of 15 Minutes at the the patient's bedside AND on the patient's hospital floor or unit, greater than 50% of which was counseling/coordinating care for stage v CKD, needs temp cath.

## 2015-06-09 NOTE — Progress Notes (Signed)
Assessment: 1 ESRD with uremic syndrome 2 Nausea and vomiting likely uremia, but cannot r/o gastroparesis 3 Weight loss due to 1 & 2 4 CM EF 25-30% 5 Hep C pos viral load 6 P A flutter 7 Diabetes Mellitus w complications Plan: 1 Place TDC Today 2 Dialysis Today, keep even or positive 3 Arrange for AV access next week 4 Vein Mapping  Subjective: Interval History: Less vomiting overnight  Objective: Vital signs in last 24 hours: Temp:  [97.5 F (36.4 C)-98.7 F (37.1 C)] 98.7 F (37.1 C) (03/18 0826) Pulse Rate:  [55-63] 63 (03/18 0826) Resp:  [17-22] 17 (03/18 0826) BP: (110-137)/(60-80) 134/80 mmHg (03/18 0826) SpO2:  [99 %-100 %] 99 % (03/18 0826) Weight:  [75.116 kg (165 lb 9.6 oz)] 75.116 kg (165 lb 9.6 oz) (03/17 1123) Weight change:   Intake/Output from previous day: 03/17 0701 - 03/18 0700 In: 120 [P.O.:120] Out: 400 [Urine:400] Intake/Output this shift: Total I/O In: -  Out: 425 [Urine:425]  General appearance: alert and cooperative Resp: clear to auscultation bilaterally GI: soft, non-tender; bowel sounds normal; no masses,  no organomegaly Extremities: right BKAReduced skin turgor   Lab Results:  Recent Labs  06/08/15 1141 06/09/15 0628  WBC 6.6 7.3  HGB 12.3* 11.9*  HCT 37.6* 35.8*  PLT 79* 93*   BMET:  Recent Labs  06/08/15 1141 06/09/15 0628  NA 133* 135  K 4.3 4.7  CL 101 103  CO2 21* 21*  GLUCOSE 248* 92  BUN 114* 109*  CREATININE 5.83* 5.59*  CALCIUM 8.7* 8.6*   No results for input(s): PTH in the last 72 hours. Iron Studies: No results for input(s): IRON, TIBC, TRANSFERRIN, FERRITIN in the last 72 hours. Studies/Results: No results found.  Scheduled: . amiodarone  200 mg Oral Daily  . apixaban  5 mg Oral BID  . carvedilol  12.5 mg Oral BID WC  . famotidine  20 mg Oral Daily  . insulin aspart  0-9 Units Subcutaneous TID WC  . levothyroxine  25 mcg Oral QAC breakfast  . multivitamin with minerals  1 tablet Oral Daily    LOS: 1 day   Usher Hedberg C 06/09/2015,9:33 AM

## 2015-06-09 NOTE — Progress Notes (Signed)
Subjective: No complaints, nausea has improved w/ IV zofran.  Objective: Vital signs in last 24 hours: Filed Vitals:   06/08/15 1706 06/08/15 2100 06/09/15 0600 06/09/15 0826  BP: 135/70 137/73 136/60 134/80  Pulse: 55 59 61 63  Temp: 97.8 F (36.6 C) 98.3 F (36.8 C) 98.6 F (37 C) 98.7 F (37.1 C)  TempSrc: Oral Oral Oral Oral  Resp: 22 18 18 17   SpO2: 100% 100% 100% 99%   Weight change:   Intake/Output Summary (Last 24 hours) at 06/09/15 1020 Last data filed at 06/09/15 1016  Gross per 24 hour  Intake    120 ml  Output    825 ml  Net   -705 ml   General: NAD, laying in bed comfortably Lungs: diffuse wheezing b/l Cardiac: RRR, no murmurs GI: soft, active bowel sounds  Neuro: CN II-XII grossly intact  Lab Results: Basic Metabolic Panel:  Recent Labs Lab 06/08/15 1141 06/09/15 0628  NA 133* 135  K 4.3 4.7  CL 101 103  CO2 21* 21*  GLUCOSE 248* 92  BUN 114* 109*  CREATININE 5.83* 5.59*  CALCIUM 8.7* 8.6*  PHOS  --  4.2   Liver Function Tests:  Recent Labs Lab 06/08/15 1919 06/09/15 0628  AST 41  --   ALT 36  --   ALKPHOS 57  --   BILITOT 0.8  --   PROT 7.2  --   ALBUMIN 2.8* 2.7*   CBC:  Recent Labs Lab 06/08/15 1141 06/09/15 0628  WBC 6.6 7.3  NEUTROABS 4.4  --   HGB 12.3* 11.9*  HCT 37.6* 35.8*  MCV 69.1* 69.1*  PLT 79* 93*   CBG:  Recent Labs Lab 06/08/15 1655 06/08/15 2139 06/09/15 0825  GLUCAP 255* 164* 112*   Medications: I have reviewed the patient's current medications. Scheduled Meds: . amiodarone  200 mg Oral Daily  . apixaban  5 mg Oral BID  . carvedilol  12.5 mg Oral BID WC  . famotidine  20 mg Oral Daily  . insulin aspart  0-9 Units Subcutaneous TID WC  . levothyroxine  25 mcg Oral QAC breakfast  . 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 Symptomatic uremia - flu panel negative. BUN 109 this am.  -Nephrology on board; HD cath and HD today. Arranging for AV access next week.  -Zofran IV PRN for nausea -Continue home pepcid  2. Atrial Flutter:  - Continue home amiodarone and coreg -Continue home eliquis  3. CAD: RCA DES in 2006 - Continue coreg and statin  4. Hypothyroidism- TSH elevated at 03/2715 clinic visit. Started on synthroid 25mcg daily. Concern for sick euthyroid syndrome versus hypothyroidism.  - ordered TSH  - Continue synthroid   DVT Prophylaxis: Eliquis Code Status: Full  Dispo: Disposition is deferred at this time, awaiting improvement of current medical problems. Anticipated discharge in approximately 2-3 day(s).   The patient does have a current PCP Doneen Poisson(Lawrence Klima, MD) and [facility].  The patient does not have transportation limitations that hinder transportation to clinic appointments.  .Services Needed at time of discharge: Y = Yes, Blank = No PT:   OT:   RN:   Equipment:   Other:  LOS: 1 day   Denton Brick, MD 06/09/2015, 10:20 AM

## 2015-06-09 NOTE — Procedures (Signed)
Placement of right jugular Trialysis catheter.  Tip in right atrium and ready to use.  No immediate complication.  Minimal blood loss.

## 2015-06-10 ENCOUNTER — Inpatient Hospital Stay (HOSPITAL_COMMUNITY): Payer: Medicare Other

## 2015-06-10 DIAGNOSIS — N186 End stage renal disease: Secondary | ICD-10-CM

## 2015-06-10 LAB — RENAL FUNCTION PANEL
ALBUMIN: 2.6 g/dL — AB (ref 3.5–5.0)
Anion gap: 7 (ref 5–15)
BUN: 61 mg/dL — AB (ref 6–20)
CHLORIDE: 104 mmol/L (ref 101–111)
CO2: 25 mmol/L (ref 22–32)
CREATININE: 4.13 mg/dL — AB (ref 0.61–1.24)
Calcium: 8.3 mg/dL — ABNORMAL LOW (ref 8.9–10.3)
GFR calc Af Amer: 15 mL/min — ABNORMAL LOW (ref >60)
GFR, EST NON AFRICAN AMERICAN: 13 mL/min — AB (ref >60)
GLUCOSE: 111 mg/dL — AB (ref 65–99)
POTASSIUM: 4.1 mmol/L (ref 3.5–5.1)
Phosphorus: 3.8 mg/dL (ref 2.5–4.6)
Sodium: 136 mmol/L (ref 135–145)

## 2015-06-10 LAB — CBC
HEMATOCRIT: 36.1 % — AB (ref 39.0–52.0)
Hemoglobin: 12.1 g/dL — ABNORMAL LOW (ref 13.0–17.0)
MCH: 23.1 pg — ABNORMAL LOW (ref 26.0–34.0)
MCHC: 33.5 g/dL (ref 30.0–36.0)
MCV: 69 fL — AB (ref 78.0–100.0)
PLATELETS: 60 10*3/uL — AB (ref 150–400)
RBC: 5.23 MIL/uL (ref 4.22–5.81)
RDW: 16.4 % — AB (ref 11.5–15.5)
WBC: 6.4 10*3/uL (ref 4.0–10.5)

## 2015-06-10 LAB — HEPATITIS B SURFACE ANTIBODY,QUALITATIVE: HEP B S AB: REACTIVE

## 2015-06-10 LAB — GLUCOSE, CAPILLARY
GLUCOSE-CAPILLARY: 124 mg/dL — AB (ref 65–99)
Glucose-Capillary: 103 mg/dL — ABNORMAL HIGH (ref 65–99)
Glucose-Capillary: 150 mg/dL — ABNORMAL HIGH (ref 65–99)
Glucose-Capillary: 151 mg/dL — ABNORMAL HIGH (ref 65–99)

## 2015-06-10 LAB — HEPATITIS B CORE ANTIBODY, TOTAL: HEP B C TOTAL AB: POSITIVE — AB

## 2015-06-10 LAB — HEPATITIS B SURFACE ANTIGEN: Hepatitis B Surface Ag: NEGATIVE

## 2015-06-10 MED ORDER — HEPARIN SODIUM (PORCINE) 5000 UNIT/ML IJ SOLN
5000.0000 [IU] | Freq: Three times a day (TID) | INTRAMUSCULAR | Status: DC
  Administered 2015-06-10 – 2015-06-12 (×6): 5000 [IU] via SUBCUTANEOUS
  Filled 2015-06-10 (×4): qty 1

## 2015-06-10 MED ORDER — ENOXAPARIN SODIUM 30 MG/0.3ML ~~LOC~~ SOLN
30.0000 mg | SUBCUTANEOUS | Status: DC
  Filled 2015-06-10: qty 0.3

## 2015-06-10 MED ORDER — LEVOTHYROXINE SODIUM 50 MCG PO TABS
50.0000 ug | ORAL_TABLET | Freq: Every day | ORAL | Status: DC
  Administered 2015-06-11 – 2015-06-19 (×8): 50 ug via ORAL
  Filled 2015-06-10 (×9): qty 1

## 2015-06-10 NOTE — Progress Notes (Signed)
   Subjective: Patient had mild nausea this morning without vomiting improved on 1 dose of Zofran. Otherwise feeling pretty well with no new complaints.  Objective: Vital signs in last 24 hours: Filed Vitals:   06/09/15 2030 06/09/15 2055 06/10/15 0630 06/10/15 0858  BP: 151/90 132/85 143/76 130/79  Pulse: 70 67 61 59  Temp: 98.1 F (36.7 C) 98 F (36.7 C) 99.5 F (37.5 C) 99.6 F (37.6 C)  TempSrc: Oral   Oral  Resp: 16 18 18 17   Weight: 65.7 kg (144 lb 13.5 oz)     SpO2:  100% 99% 100%   Weight change:   Intake/Output Summary (Last 24 hours) at 06/10/15 0858 Last data filed at 06/10/15 0841  Gross per 24 hour  Intake    120 ml  Output   1075 ml  Net   -955 ml   GENERAL- alert, co-operative, NAD HEENT- RIJ HD catheter CARDIAC- RRR, no murmurs, rubs or gallops RESP- Faint basilar crackles EXTREMITIES- RLE BKA, no pedal edema. SKIN- Warm, dry, No rash or lesion. PSYCH- Normal mood and affect, appropriate thought content and speech.   Medications: I have reviewed the patient's current medications. Scheduled Meds: . amiodarone  200 mg Oral Daily  . carvedilol  12.5 mg Oral BID WC  . famotidine  20 mg Oral Daily  . insulin aspart  0-9 Units Subcutaneous TID WC  . ipratropium-albuterol  3 mL Nebulization Once  . [START ON 06/11/2015] levothyroxine  50 mcg Oral QAC breakfast  . multivitamin with minerals  1 tablet Oral Daily   Continuous Infusions:  PRN Meds:.sodium chloride, sodium chloride, alteplase, fluticasone, heparin, heparin, heparin, lidocaine (PF), lidocaine, lidocaine-prilocaine, ondansetron, pentafluoroprop-tetrafluoroeth Assessment/Plan: Symptomatic uremia: flu panel negative. BUN 109->61 after HD yesterday. Still having some nausea this morning but improving greatly. Currently in process of evaluation and scheduling of AV access and tunneled catheter placement for next week. -Nephrology on board, next HD likely tomorrow -Needs AV access, tunneled cath,  outpatient HD placement -Zofran IV PRN for nausea -Continue home pepcid  Hypothyroidism: TSH elevated at 04/20/15 clinic visit. Started on synthroid 25mcg daily. TSH 10.224 yesterday, still significantly elevated while on this dose. -Titrate synthroid dose to 50 mcg  Atrial flutter: On eliquis PTA, holding for upcoming procedures.  Diet: Renal/Carb mod DVT Prophylaxis: Shafter enoxaparin FULL CODE  Dispo: Disposition is deferred at this time, awaiting improvement of current medical problems. Anticipated discharge in approximately 2-4 day(s).   The patient does have a current PCP Doneen Poisson(Lawrence Klima, MD) and [facility] after discharge.   LOS: 2 days   Fuller Planhristopher W Oprah Camarena, MD 06/10/2015, 8:58 AM

## 2015-06-10 NOTE — Progress Notes (Signed)
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  Temp cath placed yesterday and #1 dialysis Objective: Vital signs in last 24 hours: Temp:  [97.9 F (36.6 C)-99.6 F (37.6 C)] 99.6 F (37.6 C) (03/19 0858) Pulse Rate:  [59-72] 59 (03/19 0858) Resp:  [13-22] 17 (03/19 0858) BP: (100-151)/(66-90) 130/79 mmHg (03/19 0858) SpO2:  [99 %-100 %] 100 % (03/19 0858) Weight:  [65.6 kg (144 lb 10 oz)-65.7 kg (144 lb 13.5 oz)] 65.7 kg (144 lb 13.5 oz) (03/18 2030) Weight change:   Intake/Output from previous day: 03/18 0701 - 03/19 0700 In: 120 [P.O.:120] Out: 1125 [Urine:1125] Intake/Output this shift: Total I/O In: 240 [P.O.:240] Out: 375 [Urine:375]  General appearance: alert and cooperative Resp: clear to auscultation bilaterally Chest wall: no tenderness GI: soft, non-tender; bowel sounds normal; no masses,  no organomegaly Extremities: extremities normal, atraumatic, no cyanosis or edema  Lab Results:  Recent Labs  06/09/15 0628 06/10/15 0451  WBC 7.3 6.4  HGB 11.9* 12.1*  HCT 35.8* 36.1*  PLT 93* 60*   BMET:  Recent Labs  06/09/15 0628 06/10/15 0451  NA 135 136  K 4.7 4.1  CL 103 104  CO2 21* 25  GLUCOSE 92 111*  BUN 109* 61*  CREATININE 5.59* 4.13*  CALCIUM 8.6* 8.3*   No results for input(s): PTH in the last 72 hours. Iron Studies: No results for input(s): IRON, TIBC, TRANSFERRIN, FERRITIN in the last 72 hours. Studies/Results: Ir Fluoro Guide Cv Line Right  06/09/2015  INDICATION: 70 year old with chronic kidney disease and needs hemodialysis. Plan for a non tunneled catheter because the patient is on anticoagulation medicine.  EXAM: FLUOROSCOPIC AND ULTRASOUND GUIDED PLACEMENT OF NON TUNNELED DIALYSIS CATHETER Physician: Rachelle Hora. Henn, MD MEDICATIONS: None ANESTHESIA/SEDATION: None FLUOROSCOPY TIME:  Fluoroscopy Time: 6 seconds, less than 1 mGy COMPLICATIONS: None immediate. PROCEDURE: Informed consent was obtained for placement of a non tunneled dialysis catheter. The patient was placed supine on the interventional table. Ultrasound confirmed a patent right internal jugular vein. Ultrasound images were obtained for documentation. The right side of the neck was prepped and draped in a sterile fashion. The right side of the neck was anesthetized with 1% lidocaine. Maximal barrier sterile technique was utilized including caps, mask, sterile gowns, sterile gloves, sterile drape, hand hygiene and skin antiseptic. A small incision was made with #11 blade scalpel. A 19 gauge needle directed into the right internal jugular vein with ultrasound guidance. A J wire was advanced into the IVC. The tract was dilated to accommodate a 20 cm Trialysis catheter. The tip was placed in the right atrium. Lumens aspirated and flushed well. Appropriate amount of heparin was placed in the dialysis lumens. Catheter was sutured to the skin. Bandages placed over the catheter site. Fluoroscopic and ultrasound images were taken and saved for documentation. FINDINGS: Catheter tip in the right atrium. IMPRESSION: Successful placement of a non tunneled dialysis catheter using ultrasound and fluoroscopic guidance. Electronically Signed   By: Richarda Overlie M.D.   On: 06/09/2015 18:00   Ir US Guide Vasc Access Right  06/09/2015  INDICATION: 70 year old with chronic kidney disease and needs hemodialysis.  Plan for a non tunneled catheter because the patient is on anticoagulation medicine. EXAM: FLUOROSCOPIC AND ULTRASOUND GUIDED PLACEMENT OF NON TUNNELED DIALYSIS CATHETER Physician: Rachelle HoraAdam R. Henn, MD MEDICATIONS: None ANESTHESIA/SEDATION: None FLUOROSCOPY TIME:  Fluoroscopy  Time: 6 seconds, less than 1 mGy COMPLICATIONS: None immediate. PROCEDURE: Informed consent was obtained for placement of a non tunneled dialysis catheter. The patient was placed supine on the interventional table. Ultrasound confirmed a patent right internal jugular vein. Ultrasound images were obtained for documentation. The right side of the neck was prepped and draped in a sterile fashion. The right side of the neck was anesthetized with 1% lidocaine. Maximal barrier sterile technique was utilized including caps, mask, sterile gowns, sterile gloves, sterile drape, hand hygiene and skin antiseptic. A small incision was made with #11 blade scalpel. A 19 gauge needle directed into the right internal jugular vein with ultrasound guidance. A J wire was advanced into the IVC. The tract was dilated to accommodate a 20 cm Trialysis catheter. The tip was placed in the right atrium. Lumens aspirated and flushed well. Appropriate amount of heparin was placed in the dialysis lumens. Catheter was sutured to the skin. Bandages placed over the catheter site. Fluoroscopic and ultrasound images were taken and saved for documentation. FINDINGS: Catheter tip in the right atrium. IMPRESSION: Successful placement of a non tunneled dialysis catheter using ultrasound and fluoroscopic guidance. Electronically Signed   By: Richarda OverlieAdam  Henn M.D.   On: 06/09/2015 18:00    Scheduled: . amiodarone  200 mg Oral Daily  . carvedilol  12.5 mg Oral BID WC  . enoxaparin (LOVENOX) injection  30 mg Subcutaneous Q24H  . famotidine  20 mg Oral Daily  . insulin aspart  0-9 Units Subcutaneous TID WC  . ipratropium-albuterol  3 mL Nebulization Once  . [START ON 06/11/2015] levothyroxine  50 mcg Oral QAC breakfast  . multivitamin with minerals  1 tablet Oral Daily     LOS: 2 days   Aaryav Hopfensperger C 06/10/2015,11:55 AM

## 2015-06-10 NOTE — Progress Notes (Signed)
Right  Upper Extremity Vein Map    Cephalic  Segment Diameter Depth Comment  1. Axilla 3.588mm 2mm   2. Mid upper arm 4.647mm 2.757mm   3. Above AC 4.731mm 2mm   4. In [facility] 4.557mm 2.431mm   5. Below AC 4.481mm 4.552mm Multiple branches  6. Mid forearm 4.1001mm 3.13mm   7. Wrist 3.423mm 1.668mm    mm mm    mm mm    mm mm     Left Upper Extremity Vein Map    Cephalic  Segment Diameter Depth Comment  1. Axilla 2.371mm 3.343mm   2. Mid upper arm 2.677mm 3.637mm   3. Above AC 3.755mm 5.336mm   4. In AC 3.753mm mm   5. Below AC 2.344mm 5.566mm   6. Mid forearm 2.676mm 3.599mm   7. Wrist 2.422mm 2.161mm    mm mm    mm mm    mm mm    Basilic  Segment Diameter Depth Comment  1. Axilla mm mm   2. Origin 4mm mm   3. Above AC 6.72mm 9.764mm   4. In [facility] 3.645mm 4.591mm   5. Below AC 2.544mm 2.67mm   6. Mid forearm 2.357mm 2.646mm   7. Wrist 2.241mm 1.496mm Branch   mm 3.304mm    mm mm    mm mm

## 2015-06-11 DIAGNOSIS — D696 Thrombocytopenia, unspecified: Secondary | ICD-10-CM

## 2015-06-11 DIAGNOSIS — N185 Chronic kidney disease, stage 5: Secondary | ICD-10-CM

## 2015-06-11 DIAGNOSIS — E43 Unspecified severe protein-calorie malnutrition: Secondary | ICD-10-CM | POA: Insufficient documentation

## 2015-06-11 DIAGNOSIS — B182 Chronic viral hepatitis C: Secondary | ICD-10-CM

## 2015-06-11 LAB — RENAL FUNCTION PANEL
ALBUMIN: 2.6 g/dL — AB (ref 3.5–5.0)
Anion gap: 11 (ref 5–15)
BUN: 72 mg/dL — ABNORMAL HIGH (ref 6–20)
CO2: 24 mmol/L (ref 22–32)
Calcium: 8.5 mg/dL — ABNORMAL LOW (ref 8.9–10.3)
Chloride: 101 mmol/L (ref 101–111)
Creatinine, Ser: 4.34 mg/dL — ABNORMAL HIGH (ref 0.61–1.24)
GFR calc Af Amer: 14 mL/min — ABNORMAL LOW (ref >60)
GFR calc non Af Amer: 12 mL/min — ABNORMAL LOW (ref >60)
GLUCOSE: 111 mg/dL — AB (ref 65–99)
Phosphorus: 3.8 mg/dL (ref 2.5–4.6)
Potassium: 4.3 mmol/L (ref 3.5–5.1)
SODIUM: 136 mmol/L (ref 135–145)

## 2015-06-11 LAB — GLUCOSE, CAPILLARY
GLUCOSE-CAPILLARY: 170 mg/dL — AB (ref 65–99)
GLUCOSE-CAPILLARY: 120 mg/dL — AB (ref 65–99)
Glucose-Capillary: 242 mg/dL — ABNORMAL HIGH (ref 65–99)

## 2015-06-11 MED ORDER — LIDOCAINE HCL (PF) 1 % IJ SOLN: 5.0000 mL | INTRAMUSCULAR | Status: DC | PRN

## 2015-06-11 MED ORDER — ALTEPLASE 2 MG IJ SOLR: 2.0000 mg | Freq: Once | INTRAMUSCULAR | Status: DC | PRN

## 2015-06-11 MED ORDER — DOCUSATE SODIUM 100 MG PO CAPS
100.0000 mg | ORAL_CAPSULE | Freq: Every day | ORAL | Status: DC | PRN
  Administered 2015-06-11: 100 mg via ORAL
  Filled 2015-06-11: qty 1

## 2015-06-11 MED ORDER — HEPARIN SODIUM (PORCINE) 1000 UNIT/ML DIALYSIS: 20.0000 [IU]/kg | INTRAMUSCULAR | Status: DC | PRN

## 2015-06-11 MED ORDER — SODIUM CHLORIDE 0.9 % IV SOLN: 100.0000 mL | INTRAVENOUS | Status: DC | PRN

## 2015-06-11 MED ORDER — HEPARIN SODIUM (PORCINE) 1000 UNIT/ML DIALYSIS: 1000.0000 [IU] | INTRAMUSCULAR | Status: DC | PRN

## 2015-06-11 MED ORDER — NEPRO/CARBSTEADY PO LIQD
237.0000 mL | Freq: Two times a day (BID) | ORAL | Status: DC
  Administered 2015-06-15 (×2): 237 mL via ORAL

## 2015-06-11 MED ORDER — PENTAFLUOROPROP-TETRAFLUOROETH EX AERO: 1.0000 "application " | INHALATION_SPRAY | CUTANEOUS | Status: DC | PRN

## 2015-06-11 MED ORDER — POLYETHYLENE GLYCOL 3350 17 G PO PACK
17.0000 g | PACK | Freq: Once | ORAL | Status: AC
  Administered 2015-06-11: 17 g via ORAL
  Filled 2015-06-11: qty 1

## 2015-06-11 MED ORDER — LIDOCAINE-PRILOCAINE 2.5-2.5 % EX CREA: 1.0000 "application " | TOPICAL_CREAM | CUTANEOUS | Status: DC | PRN

## 2015-06-11 NOTE — Consult Note (Signed)
Hospital Consult    Reason for Consult:  In need of TDC and permanent HD access Referring Physician:  [place]hertz  MRN #:  [id number]  History of Present Illness: This is a 70 y.o. 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  He is on insulin for his DM.  He does have a hx of CAD with RCA stenting in 2006 and has chronic systolic heart failure with estimated EF of 25-30% on echo earlier this year.  He has Hepatitis C.  He is on synthroid for hypothyroidism.    He had been on Eliquis for A flutter, but has been off this since admission.  He did have an EP study w/o ablation.  He is also on amiodarone.    He did have a temporary right IJ catheter placed in IR.  [facility] has been consulted for TDC and permanent HD access.  The pt is right handed.    He does have a thrombocytopenia with platelet count of 60k yesterday (06/10/15) and this is trending downward over the past few days.  He is on SQ heparin.   Past Medical History  Diagnosis Date  . Hyperlipidemia 04/02/2006  . Essential hypertension 12/09/2006  . Benign prostatic hypertrophy with nocturia 07/07/2006  . Chronic venous insufficiency 04/12/2010  . Obesity (BMI 30.0-34.9) 01/15/2012  . Constipation 05/25/2009    Intermittent   . Microcytic normochromic anemia 05/27/2006  . Internal and external hemorrhoids without complication 08/20/2012  . Coronary artery disease 04/02/2006    s/p RCA DES 2006, low risk myoview in 2011  . 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 Type 2 diabetes mellitus with peripheral artery disease (HCC) 05/27/2006    Absent pulses in the left foot   . Closed displaced  fracture of left femoral neck (HCC) 06/14/2014    s/p left hip hemiarthroplasty June 14, 2014   . Atrial flutter (HCC) 07/26/2014    a. May 2016, CHA2DS2VASc = 4 -> Eliquis, spontaneous conversion to NSR;  b. On amio;  c. 01/2015 EPS: Unable to induce right sided Aflutter. Non-sustained Afib and Left sided Aflutter noted.  . GERD (gastroesophageal reflux disease)   . Type 2 diabetes mellitus with stage 4 chronic kidney disease (HCC) 08/06/2012  . History of blood transfusion 05/2014    "related to hip OR"  . Degenerative joint disease involving multiple joints 02/02/2007  . Arthritis     "fingers" (02/07/2015)  . Nausea and vomiting 06/01/2015    Past Surgical History  Procedure Laterality Date  . Pilonidal cyst excision  1990's  . Refractive surgery Bilateral   . Fracture surgery    . Total hip arthroplasty Left 06/14/2014    Procedure: HEMI HIP ARTHROPLASTY ANTERIOR APPROACH;  Surgeon: Naiping M Xu, MD;  Location: [facility] OR;  Service: Orthopedics;  Laterality: Left;  . Joint replacement    . Leg amputation above knee Right ~ 2008  . Cholecystectomy N/A 12/12/2014    Procedure: LAPAROSCOPIC CHOLECYSTECTOMY WITH INTRAOPERATIVE CHOLANGIOGRAM;  Surgeon: Eric Wilson, MD;  Location: [facility] OR;  Service: General;  Laterality: N/A;  . Electrophysiologic study N/A 02/07/2015      Procedure: A-Flutter Ablation;  Surgeon: Gregg W Taylor, MD;  Location: [facility] INVASIVE [facility];  Service: Cardiovascular;  Laterality: N/A;  . Tonsillectomy    . Coronary angioplasty with stent placement    . Prostate biopsy  ~ 2013    Allergies  Allergen Reactions  . Amoxicillin Swelling and Other (See Comments)    Puffy eyes and abdominal pain with Amoxicillin PO  . Lisinopril Other (See Comments)    Acute kidney injury  . Pravastatin Other (See Comments)    Weakness and fatigue  . Tamsulosin Other (See Comments)    REACTION: dry throat, sweating, blurred vision  . Doxycycline Rash and Other (See Comments)    Questionable  drug rxn rash    Prior to Admission medications   Medication Sig Start Date End Date Taking? Authorizing Provider  amiodarone (PACERONE) 200 MG tablet Take 1 tablet (200 mg total) by mouth daily. 01/19/15  Yes Lawrence Klima, MD  amLODipine (NORVASC) 5 MG tablet Take 1 tablet (5 mg total) by mouth daily. 04/16/15  Yes Rhonda G Barrett, PA-C  calcium citrate-vitamin D (CITRACAL+D) 315-200 MG-UNIT per tablet Take 1 tablet by mouth daily.    Yes Historical Provider, MD  carvedilol (COREG) 12.5 MG tablet Take 1 tablet (12.5 mg total) by mouth 2 (two) times daily with a meal. 01/19/15  Yes Lawrence Klima, MD  Darbepoetin Alfa (ARANESP) 100 MCG/0.5ML SOSY injection Inject 0.5 mLs (100 mcg total) into the skin every 7 (seven) days. 04/13/15  Yes Nicholas A Taylor, MD  docusate sodium (COLACE) 100 MG capsule Take 100 mg by mouth at bedtime as needed for moderate constipation (constipation). Reported on 04/20/2015   Yes Historical Provider, MD  ELIQUIS 5 MG TABS tablet Take 5 mg by mouth 2 (two) times daily. 01/15/15  Yes Historical Provider, MD  famotidine (PEPCID) 20 MG tablet Take 20 mg before breakfast and 20 mg before supper 04/16/15  Yes Rhonda G Barrett, PA-C  fish oil-omega-3 fatty acids 1000 MG capsule Take 1 g by mouth daily. 01/15/12  Yes Lawrence Klima, MD  fluticasone (FLONASE) 50 MCG/ACT nasal spray Place 1 spray into both nostrils daily as needed for allergies. 01/19/15  Yes Lawrence Klima, MD  furosemide (LASIX) 20 MG tablet Take 3 tablets (60 mg total) by mouth daily. 04/10/15  Yes Nicholas A Taylor, MD  hydrALAZINE (APRESOLINE) 10 MG tablet Take 1 tablet (10 mg total) by mouth every 8 (eight) hours. 04/02/15  Yes Jeremy Ford, MD  hydroxypropyl methylcellulose (ISOPTO TEARS) 2.5 % ophthalmic solution Place 2 drops into both eyes 4 (four) times daily as needed for dry eyes. Reported on 04/20/2015   Yes Historical Provider, MD  isosorbide mononitrate (IMDUR) 30 MG 24 hr tablet Take 1 tablet (30 mg  total) by mouth daily. 05/29/15  Yes Lawrence Klima, MD  levothyroxine (SYNTHROID, LEVOTHROID) 25 MCG tablet Take 1 tablet (25 mcg total) by mouth daily before breakfast. 06/01/15  Yes Lawrence Klima, MD  Multiple Vitamins-Minerals (MULTIVITAMIN WITH MINERALS) tablet Take 1 tablet by mouth daily.   Yes Historical Provider, MD  ondansetron (ZOFRAN) 4 MG tablet Take 1 tablet (4 mg total) by mouth every 8 (eight) hours as needed for nausea or vomiting. 06/01/15  Yes Kyle Flores, MD  terazosin (HYTRIN) 5 MG capsule Take 1 capsule (5 mg total) by mouth at bedtime. 05/29/15  Yes Lawrence Klima, MD    Social History   Social History  . Marital Status: Married    Spouse Name: N/A  . Number   of Children: N/A  . Years of Education: N/A   Occupational History  . Not on file.   Social History Main Topics  . Smoking status: Former Smoker -- 1.00 packs/day for 10 years    Types: Cigarettes    Quit date: 06/16/1968  . Smokeless tobacco: Never Used  . Alcohol Use: No     Comment: "quit alcohol in the 1960's"  . Drug Use: No  . Sexual Activity: Not Currently    Birth Control/ Protection: None   Other Topics Concern  . Not on file   Social History Narrative     Family History  Problem Relation Age of Onset  . Hypertension Mother   . Heart attack Father   . Breast cancer Sister   . Arthritis Sister   . Heart attack Brother   . Diabetes Brother   . Stroke Brother   . Pneumonia Daughter   . Diabetes Brother   . Alcoholism Brother   . Diabetes Brother   . Arthritis Sister     Bilateral knee replacement  . HIV Daughter   . Hypertension Daughter   . Drug abuse Daughter   . Schizophrenia Daughter   . Hypertension Daughter   . Drug abuse Daughter   . Bipolar disorder Daughter     ROS: [x] Positive   [ ] Negative   [ ] All sytems reviewed and are negative  Cardiovascular: [] chest pain/pressure [x] hx RCA stent placement 2006 [x] CM with decreased EF [x] A flutter [] SOB lying  flat [] DOE [] pain in legs while walking [] pain in legs at rest [] pain in legs at night [] non-healing ulcers [] hx of DVT [] swelling in legs  Pulmonary: [] productive cough [] asthma/wheezing [] home O2  Neurologic: [] weakness in [] arms [] legs [] numbness in [] arms [] legs [] hx of CVA [] mini stroke []difficulty speaking or slurred speech [] temporary loss of vision in one eye [] dizziness  Hematologic: [] hx of cancer [] bleeding problems [] problems with blood clotting easily [x] anemia [x] thrombocytopenia [x] hepatitis C  Endocrine:   [x] diabetes [x] thyroid disease  GI [] vomiting blood3 [] blood in stool [x] N/V [x] GERD  GU: [x] CKD/renal failure [x] HD--[] M/W/F or [] T/T/S [] burning with urination [] blood in urine  Psychiatric: [] anxiety [] depression  Musculoskeletal: [x] arthritis [x] hx left hip hemiarthroplasty [x] hx right AKA (after failed BKA) [x] DDD [] joint pain  Integumentary: [] rashes [] ulcers  Constitutional: [] fever [] chills   Physical Examination  Filed Vitals:   06/11/15 0930 06/11/15 1000  BP: 115/69 114/57  Pulse: 60 60  Temp: 98 F (36.7 C)   Resp: 20 20   Body mass index is 22.13 kg/(m^2).  General:  WDWN in NAD Gait: Not observed HENT: WNL, normocephalic Pulmonary: normal non-labored breathing, without Rales, rhonchi,  wheezing Cardiac: regular, without  Murmurs, rubs or gallops Abdomen:  soft, NT/ND, no masses Skin: without rashes Vascular Exam/Pulses:  Right Left  Radial absent absent  Ulnar absent absent  Femoral 2+ (normal) 2+ (normal)  DP AKA Unable to palpate   PT AKA Unable to palpate    Extremities: without ischemic changes, without Gangrene , without cellulitis; without open wounds; well healed right AKA Musculoskeletal: no muscle wasting or atrophy  Neurologic: MOTOR FUNCTION:  moving all extremities equally. Speech is fluent/normal Psychiatric:  Normal  affect     CBC    Component Value Date/Time   WBC 6.4 06/10/2015 0451   WBC 6.0 10/26/2014 1356   RBC 5.23 06/10/2015 0451   RBC 3.86* 10/26/2014 1356   RBC 3.83* 06/22/2014 0600   HGB 12.1* 06/10/2015 0451   HCT 36.1* 06/10/2015 0451   HCT 28.5* 10/26/2014 1356   PLT 60* 06/10/2015 0451   PLT 141* 10/26/2014 1356   MCV 69.0* 06/10/2015 0451   MCV 74* 10/26/2014 1356   MCH 23.1* 06/10/2015 0451   MCH 23.6* 10/26/2014 1356   MCHC 33.5 06/10/2015 0451   MCHC 31.9 10/26/2014 1356   RDW 16.4* 06/10/2015 0451   RDW 18.9* 10/26/2014 1356   LYMPHSABS 1.6 06/08/2015 1141   MONOABS 0.5 06/08/2015 1141   EOSABS 0.1 06/08/2015 1141   BASOSABS 0.0 06/08/2015 1141    BMET    Component Value Date/Time   NA 136 06/11/2015 0733   NA 139 04/10/2015 1538   K 4.3 06/11/2015 0733   CL 101 06/11/2015 0733   CO2 24 06/11/2015 0733   GLUCOSE 111* 06/11/2015 0733   GLUCOSE 160* 04/10/2015 1538   BUN 72* 06/11/2015 0733   BUN 84* 04/10/2015 1538   CREATININE 4.34* 06/11/2015 0733   CREATININE 3.32* 02/02/2015 0937   CALCIUM 8.5* 06/11/2015 0733   GFRNONAA 12* 06/11/2015 0733   GFRNONAA 40* 08/22/2014 1439   GFRAA 14* 06/11/2015 0733   GFRAA 46* 08/22/2014 1439    COAGS: Lab Results  Component Value Date   INR 1.39 12/12/2014   INR 1.67* 12/11/2014   INR 1.21 07/25/2014     Non-Invasive Vascular Imaging:   Bilateral upper extremity vein mapping 06/10/15: Cephalic-Right  Segment Diameter Depth Comment  1. Axilla 3.8mm 2mm   2. Mid upper arm 4.7mm 2.7mm   3. Above AC 4.1mm 2mm   4. In AC 4.7mm 2.1mm   5. Below AC 4.1mm 4.2mm Multiple branches  6. Mid forearm 4.1mm 3.3mm   7. Wrist 3.3mm 1.8mm        Cephalic-Left  Segment Diameter Depth Comment  1. Axilla 2.1mm 3.3mm   2. Mid upper arm 2.7mm 3.7mm   3. Above AC 3.5mm 5.6mm   4. In AC 3.3mm mm   5. Below AC 2.4mm 5.6mm   6. Mid forearm 2.6mm 3.9mm   7.  Wrist 2.2mm 2.1mm    Basilic-Left  Segment Diameter Depth Comment  1. Axilla mm mm   2. Origin 4mm mm   3. Above AC 6.2mm 9.4mm   4. In AC 3.5mm 4.1mm   5. Below AC 2.4mm 2.7mm   6. Mid forearm 2.7mm 2.6mm   7. Wrist 2.1mm 1.6mm Branch         Statin:  No. Beta Blocker:  Yes.   Aspirin:  No. ACEI:  No. ARB:  No. Other antiplatelets/anticoagulants:  Yes.   Eliquis (last dose 06/08/15).  Now on SQ heparin   ASSESSMENT/PLAN: This is a 70 y.o. male with ESRD in need of  Permanent HD access and TDC placement.   -pt is right handed, however, the veins in the right arm are superior to the vein in the left arm.  His radial pulses are not palpable and therefore, would start with a right brachiocephalic AVF.   He will be at risk for steal given his absent radial pulse.   -he does have a worsening thrombocytopenia with a platelet count of 60k yesterday.  He is on amiodarone, which could be affecting his platelets.  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  Will defer to primary team. -will restrict his right arm and please move IV to left arm.   -will schedule for Wednesday.    Samantha Rhyne, PA-C [facility] [phone]  I have examined the patient, reviewed and agree with above. Discussed option for hemodialysis the patient and his wife present. Recommend right arm AV fistula. Marginal size of cephalic vein on the left. He does have normal brachial pulse but absent radial pulse bilaterally. Severe peripheral vascular disease with prior right above-knee amputation.   Will plan R AVF and tunnelled cath on Wed if medically stableEarly, Shanae Luo, MD 06/11/2015 1:32 PM  

## 2015-06-11 NOTE — Progress Notes (Signed)
   Subjective: Seen on dialysis feeling fairly well this morning. His nausea is resolved, and is drinking some fluids although he still has not been eating.  Objective: Vital signs in last 24 hours: Filed Vitals:   06/11/15 0705 06/11/15 0730 06/11/15 0800 06/11/15 0830  BP: 151/85 101/66 110/72 122/73  Pulse: 62 61 58 60  Temp:      TempSrc:      Resp: 20 18 19 20   Weight:      SpO2:       Weight change: 0.9 kg (1 lb 15.7 oz)  Intake/Output Summary (Last 24 hours) at 06/11/15 0857 Last data filed at 06/11/15 0600  Gross per 24 hour  Intake    360 ml  Output    275 ml  Net     85 ml   GENERAL- alert, co-operative, NAD HEENT- RIJ HD catheter CARDIAC- RRR, no murmurs, rubs or gallops RESP- CTAB, no wheezes or crackles EXTREMITIES- RLE BKA, no pedal edema. SKIN- Warm, dry PSYCH- Normal mood and affect  Medications: I have reviewed the patient's current medications. Scheduled Meds: . amiodarone  200 mg Oral Daily  . carvedilol  12.5 mg Oral BID WC  . famotidine  20 mg Oral Daily  . heparin  5,000 Units Subcutaneous 3 times per day  . insulin aspart  0-9 Units Subcutaneous TID WC  . ipratropium-albuterol  3 mL Nebulization Once  . levothyroxine  50 mcg Oral QAC breakfast  . multivitamin with minerals  1 tablet Oral Daily   Continuous Infusions:  PRN Meds:.sodium chloride, sodium chloride, alteplase, fluticasone, heparin, heparin, heparin, lidocaine (PF), lidocaine, lidocaine-prilocaine, ondansetron, pentafluoroprop-tetrafluoroeth Assessment/Plan: Symptomatic uremia: Getting HD today, symptoms and laboratory values responding appropriately. His nausea is resolved already but does still have poor appetite. -Hd per nephrology -Needs AV access, tunneled cath, outpatient HD placement -Zofran IV PRN for nausea -Continue home pepcid  Atrial flutter: On eliquis PTA, holding for upcoming procedures.  Diet: Renal/Carb mod DVT Prophylaxis: [place] heparin FULL CODE  Dispo:  Disposition is deferred at this time, awaiting improvement of current medical problems. Anticipated discharge in approximately 2-3 day(s).   The patient does have a current PCP Doneen Poisson(Lawrence Klima, MD) and [facility] after discharge.  LOS: 3 days   Fuller Planhristopher W Jachai Okazaki, MD 06/11/2015, 8:57 AM

## 2015-06-11 NOTE — Progress Notes (Addendum)
Initial Nutrition Assessment  DOCUMENTATION CODES:   Severe malnutrition in context of acute illness/injury  INTERVENTION:  Provide Nepro Shake po BID, each supplement provides 425 kcal and 19 grams protein.  Encourage adequate PO intake.  NUTRITION DIAGNOSIS:   Malnutrition related to  (illness) as evidenced by moderate depletions of muscle mass, moderate depletion of body fat.  GOAL:   Patient will meet greater than or equal to 90% of their needs  MONITOR:   PO intake, Supplement acceptance, Weight trends, Labs, I & O's  REASON FOR ASSESSMENT:   Malnutrition Screening Tool    ASSESSMENT:   70yo with stage 4 CKD, CHF (EF 50% May 2016) Atrial flutter, HTN, T2DM previously on insulin s/p R BKA, CAD (RCA DES in 2006), HCV (untreated, normal RUQ US) who presents with nausea and vomiting as well as weakness and generalized malaise, particularly worse over the past 2 weeks but gradually worsening over the past 6 months. Advanced CKD now progressed to ESRD and new start HD Placement of right jugular Trialysis catheter 3/18.  Meal completion has been 85%. Pt reports appetite has improved. He reports poor po the 1 week PTA due to n/v and unable to keep anything down. Usual body weight reported to be ~160 lbs. Pt with a 9.3% weight loss, which likely is associated with fluid status with the start of HD. Pt is agreeable to Nepro Shake to aid in caloric and protein needs. RD to order.   Nutrition-Focused physical exam completed. Findings are moderate fat depletion, moderate muscle depletion, and no edema.   Labs and medications reviewed.   Diet Order:  Diet renal/carb modified with fluid restriction Diet-HS Snack?: Nothing; Room service appropriate?: Yes; Fluid consistency:: Thin  Skin:  Reviewed, no issues  Last BM:  3/17  Height:   Ht Readings from Last 1 Encounters:  06/01/15 5\' 8"  (1.727 m)    Weight:   Wt Readings from Last 1 Encounters:  06/11/15 145 lb 8.1 oz (66 kg)     Ideal Body Weight:  64.4 kg (adjusted for R AKA)  BMI:  Body mass index is 22.13 kg/(m^2).  Estimated Nutritional Needs:   Kcal:  1900-2050  Protein:  85-100 grams  Fluid:  1.2 L/day  EDUCATION NEEDS:   No education needs identified at this time  Roslyn SmilingStephanie Chastity Noland, MS, RD, LDN Pager # [phone] After hours/ weekend pager # [phone]

## 2015-06-11 NOTE — Progress Notes (Signed)
Assessment: 1 ESRD with uremic syndrome- improving 3 Weight loss due to 1  4 CM EF 25-30% 5 Hep C pos viral load 6 A flutter 7 Diabetes Mellitus w complications  Plan: 1 Dialysis today and tomorrow 2 Have called [facility] to see for perm access and TDC  Subjective: Interval History: no further   Objective: Vital signs in last 24 hours: Temp:  [98.4 F (36.9 C)-99.6 F (37.6 C)] 98.4 F (36.9 C) (03/20 0700) Pulse Rate:  [58-64] 60 (03/20 0930) Resp:  [16-20] 20 (03/20 0930) BP: (101-158)/(66-86) 115/69 mmHg (03/20 0930) SpO2:  [100 %] 100 % (03/20 0700) Weight:  [66.5 kg (146 lb 9.7 oz)-66.9 kg (147 lb 7.8 oz)] 66.9 kg (147 lb 7.8 oz) (03/20 0700) Weight change: 0.9 kg (1 lb 15.7 oz)  Intake/Output from previous day: 03/19 0701 - 03/20 0700 In: 360 [P.O.:360] Out: 650 [Urine:650] Intake/Output this shift:    General appearance: alert and cooperative Resp: clear to auscultation bilaterally Chest wall: no tenderness GI: soft, non-tender; bowel sounds normal; no masses,  no organomegaly Extremities: extremities normal, atraumatic, no cyanosis or edema  Lab Results:  Recent Labs  06/09/15 0628 06/10/15 0451  WBC 7.3 6.4  HGB 11.9* 12.1*  HCT 35.8* 36.1*  PLT 93* 60*   BMET:   Recent Labs  06/10/15 0451 06/11/15 0733  NA 136 136  K 4.1 4.3  CL 104 101  CO2 25 24  GLUCOSE 111* 111*  BUN 61* 72*  CREATININE 4.13* 4.34*  CALCIUM 8.3* 8.5*   No results for input(s): PTH in the last 72 hours. Iron Studies: No results for input(s): IRON, TIBC, TRANSFERRIN, FERRITIN in the last 72 hours. Studies/Results: Ir Fluoro Guide Cv Line Right  06/09/2015  INDICATION: 70 year old with chronic kidney disease and needs hemodialysis. Plan for a non tunneled catheter because the patient is on anticoagulation medicine. EXAM: FLUOROSCOPIC AND ULTRASOUND GUIDED PLACEMENT OF NON TUNNELED DIALYSIS CATHETER Physician: Rachelle Hora. Henn, MD MEDICATIONS: None ANESTHESIA/SEDATION: None  FLUOROSCOPY TIME:  Fluoroscopy Time: 6 seconds, less than 1 mGy COMPLICATIONS: None immediate. PROCEDURE: Informed consent was obtained for placement of a non tunneled dialysis catheter. The patient was placed supine on the interventional table. Ultrasound confirmed a patent right internal jugular vein. Ultrasound images were obtained for documentation. The right side of the neck was prepped and draped in a sterile fashion. The right side of the neck was anesthetized with 1% lidocaine. Maximal barrier sterile technique was utilized including caps, mask, sterile gowns, sterile gloves, sterile drape, hand hygiene and skin antiseptic. A small incision was made with #11 blade scalpel. A 19 gauge needle directed into the right internal jugular vein with ultrasound guidance. A J wire was advanced into the IVC. The tract was dilated to accommodate a 20 cm Trialysis catheter. The tip was placed in the right atrium. Lumens aspirated and flushed well. Appropriate amount of heparin was placed in the dialysis lumens. Catheter was sutured to the skin. Bandages placed over the catheter site. Fluoroscopic and ultrasound images were taken and saved for documentation. FINDINGS: Catheter tip in the right atrium. IMPRESSION: Successful placement of a non tunneled dialysis catheter using ultrasound and fluoroscopic guidance. Electronically Signed   By: Richarda Overlie M.D.   On: 06/09/2015 18:00   Ir US Guide Vasc Access Right  06/09/2015  INDICATION: 70 year old with chronic kidney disease and needs hemodialysis. Plan for a non tunneled catheter because the patient is on anticoagulation medicine. EXAM: FLUOROSCOPIC AND ULTRASOUND GUIDED PLACEMENT OF NON  TUNNELED DIALYSIS CATHETER Physician: Rachelle HoraAdam R. Henn, MD MEDICATIONS: None ANESTHESIA/SEDATION: None FLUOROSCOPY TIME:  Fluoroscopy Time: 6 seconds, less than 1 mGy COMPLICATIONS: None immediate. PROCEDURE: Informed consent was obtained for placement of a non tunneled dialysis catheter.  The patient was placed supine on the interventional table. Ultrasound confirmed a patent right internal jugular vein. Ultrasound images were obtained for documentation. The right side of the neck was prepped and draped in a sterile fashion. The right side of the neck was anesthetized with 1% lidocaine. Maximal barrier sterile technique was utilized including caps, mask, sterile gowns, sterile gloves, sterile drape, hand hygiene and skin antiseptic. A small incision was made with #11 blade scalpel. A 19 gauge needle directed into the right internal jugular vein with ultrasound guidance. A J wire was advanced into the IVC. The tract was dilated to accommodate a 20 cm Trialysis catheter. The tip was placed in the right atrium. Lumens aspirated and flushed well. Appropriate amount of heparin was placed in the dialysis lumens. Catheter was sutured to the skin. Bandages placed over the catheter site. Fluoroscopic and ultrasound images were taken and saved for documentation. FINDINGS: Catheter tip in the right atrium. IMPRESSION: Successful placement of a non tunneled dialysis catheter using ultrasound and fluoroscopic guidance. Electronically Signed   By: Richarda OverlieAdam  Henn M.D.   On: 06/09/2015 18:00    Scheduled: . amiodarone  200 mg Oral Daily  . carvedilol  12.5 mg Oral BID WC  . famotidine  20 mg Oral Daily  . heparin  5,000 Units Subcutaneous 3 times per day  . insulin aspart  0-9 Units Subcutaneous TID WC  . ipratropium-albuterol  3 mL Nebulization Once  . levothyroxine  50 mcg Oral QAC breakfast  . multivitamin with minerals  1 tablet Oral Daily     LOS: 3 days   Seletha Zimmermann D 06/11/2015,9:59 AM

## 2015-06-11 NOTE — Progress Notes (Signed)
Internal Medicine Attending:   I saw and examined the patient. I reviewed the resident's note and I agree with the resident's findings and plan as documented in the resident's note.  Saw the patient today during his second hemodialysis session. He reports doing well, says is not as bad as he thought it would be. He has a right temporary IJ non-tunneled HD line. He otherwise has no acute complaints.  Advanced CKD now progressed to ESRD and new start HD: Symptomatically much improved with dialysis. Tolerating HD sessions well. Seen by vascular surgery who is planning for right brachiocephalic AVF placement on Wednesday. Plan for tunneled HD catheter at the same time to use while the AVF matures. We will work with [facility] to get the patient an outpatient HD chair. He has hepatitis C and hepatitis B exposure. Do we need to send a quantiferon Gold?  Thrombocytopenia: Patient has had intermittent thrombocytopenia since at least 2008. He has hepatitis C but no signs of cirrhosis, normal CT appearance of liver and spleen a few months ago. Current platelets are 60,000 which is essentially unchanged from admission level of 79,000. HIT score is low probability, so I would continue prophylactic heparin as needed and wound not send a HIT panel. I agree thrombocytopenia could be related to amiodarone, however that is a fairly rare side effect. If the level of thrombocytopenia interferes with the planned surgery, we can provide platelet support and further workup with hematology.

## 2015-06-12 DIAGNOSIS — K5900 Constipation, unspecified: Secondary | ICD-10-CM

## 2015-06-12 LAB — PROTIME-INR
INR: 1.21 (ref 0.00–1.49)
PROTHROMBIN TIME: 15.5 s — AB (ref 11.6–15.2)

## 2015-06-12 LAB — CBC
HCT: 37.4 % — ABNORMAL LOW (ref 39.0–52.0)
Hemoglobin: 12.4 g/dL — ABNORMAL LOW (ref 13.0–17.0)
MCH: 23.2 pg — AB (ref 26.0–34.0)
MCHC: 33.2 g/dL (ref 30.0–36.0)
MCV: 70 fL — AB (ref 78.0–100.0)
PLATELETS: 43 10*3/uL — AB (ref 150–400)
RBC: 5.34 MIL/uL (ref 4.22–5.81)
RDW: 16.4 % — AB (ref 11.5–15.5)
WBC: 8.1 10*3/uL (ref 4.0–10.5)

## 2015-06-12 LAB — RENAL FUNCTION PANEL
ANION GAP: 12 (ref 5–15)
Albumin: 2.6 g/dL — ABNORMAL LOW (ref 3.5–5.0)
BUN: 53 mg/dL — ABNORMAL HIGH (ref 6–20)
CALCIUM: 8.6 mg/dL — AB (ref 8.9–10.3)
CHLORIDE: 98 mmol/L — AB (ref 101–111)
CO2: 24 mmol/L (ref 22–32)
CREATININE: 4.25 mg/dL — AB (ref 0.61–1.24)
GFR, EST AFRICAN AMERICAN: 15 mL/min — AB (ref >60)
GFR, EST NON AFRICAN AMERICAN: 13 mL/min — AB (ref >60)
Glucose, Bld: 156 mg/dL — ABNORMAL HIGH (ref 65–99)
Phosphorus: 3.4 mg/dL (ref 2.5–4.6)
Potassium: 4.2 mmol/L (ref 3.5–5.1)
SODIUM: 134 mmol/L — AB (ref 135–145)

## 2015-06-12 LAB — APTT: APTT: 40 s — AB (ref 24–37)

## 2015-06-12 LAB — GLUCOSE, CAPILLARY
GLUCOSE-CAPILLARY: 134 mg/dL — AB (ref 65–99)
Glucose-Capillary: 180 mg/dL — ABNORMAL HIGH (ref 65–99)
Glucose-Capillary: 186 mg/dL — ABNORMAL HIGH (ref 65–99)
Glucose-Capillary: 128 mg/dL — ABNORMAL HIGH (ref 65–99)

## 2015-06-12 LAB — IRON AND TIBC
IRON: 52 ug/dL (ref 45–182)
Saturation Ratios: 17 % — ABNORMAL LOW (ref 17.9–39.5)
TIBC: 302 ug/dL (ref 250–450)
UIBC: 250 ug/dL

## 2015-06-12 MED ORDER — PENTAFLUOROPROP-TETRAFLUOROETH EX AERO: 1.0000 "application " | INHALATION_SPRAY | CUTANEOUS | Status: DC | PRN

## 2015-06-12 MED ORDER — ALTEPLASE 2 MG IJ SOLR: 2.0000 mg | Freq: Once | INTRAMUSCULAR | Status: DC | PRN

## 2015-06-12 MED ORDER — SODIUM CHLORIDE 0.9 % IV SOLN: 125.0000 mg | INTRAVENOUS | Status: DC

## 2015-06-12 MED ORDER — LIDOCAINE-PRILOCAINE 2.5-2.5 % EX CREA: 1.0000 "application " | TOPICAL_CREAM | CUTANEOUS | Status: DC | PRN

## 2015-06-12 MED ORDER — SODIUM CHLORIDE 0.9 % IV SOLN
510.0000 mg | Freq: Once | INTRAVENOUS | Status: DC
  Filled 2015-06-12: qty 17

## 2015-06-12 MED ORDER — HEPARIN SODIUM (PORCINE) 1000 UNIT/ML DIALYSIS: 2000.0000 [IU] | INTRAMUSCULAR | Status: DC | PRN

## 2015-06-12 MED ORDER — SODIUM CHLORIDE 0.9 % IV SOLN: 100.0000 mL | INTRAVENOUS | Status: DC | PRN

## 2015-06-12 MED ORDER — LIDOCAINE HCL (PF) 1 % IJ SOLN: 5.0000 mL | INTRAMUSCULAR | Status: DC | PRN

## 2015-06-12 MED ORDER — HEPARIN SODIUM (PORCINE) 1000 UNIT/ML DIALYSIS: 1000.0000 [IU] | INTRAMUSCULAR | Status: DC | PRN

## 2015-06-12 MED ORDER — HEPARIN SODIUM (PORCINE) 5000 UNIT/ML IJ SOLN
5000.0000 [IU] | Freq: Three times a day (TID) | INTRAMUSCULAR | Status: DC
  Administered 2015-06-12 – 2015-06-13 (×4): 5000 [IU] via SUBCUTANEOUS
  Filled 2015-06-12 (×2): qty 1

## 2015-06-12 NOTE — Progress Notes (Signed)
Patient arrived to unit by bed.  Reviewed treatment plan and this RN agrees with plan.  Report received from bedside RN, Jasmine.  Consent verified.  Patient A & O X 4 .   Lung sounds clear to ausculation in all fields. No edema. Cardiac:  Regular R&R.  Removed caps and cleansed RIJ catheter with chlorhedxidine.  Aspirated ports of heparin and flushed them with saline per protocol.  Connected and secured lines, initiated treatment at 0856.  UF Goal of and net fluid removal 0L.  Will continue to monitor.

## 2015-06-12 NOTE — Care Management Important Message (Signed)
Important Message  Patient Details  Name: [name] [name] MRN: [id number] Date of Birth: 04/09/1945   Medicare Important Message Given:  Yes    Oralia RudMegan P Kamyra Schroeck 06/12/2015, 12:05 PM

## 2015-06-12 NOTE — Progress Notes (Signed)
Dialysis treatment completed.  500 mL ultrafiltrated.  0 mL net fluid removal.  Patient status unchanged. Lung sounds clear to ausculation in all fields. No edema. Cardiac: bradycardic.  Cleansed RIJ catheter with chlorhexidine.  Disconnected lines and flushed ports with saline per protocol.  Ports locked with heparin and capped per protocol.    Report given to bedside, RN Jasmine.

## 2015-06-12 NOTE — Progress Notes (Signed)
Internal Medicine Attending:   I saw and examined the patient. I reviewed the resident's note and I agree with the resident's findings and plan as documented in the resident's note.  70 year old man with new end-stage renal disease admitted for initiation of hemodialysis. He was seen today in the HD unit and is doing well on his third session. Symptoms of uremia are resolving nicely, he is advancing his oral intake. He continues to have worsening of chronic thrombocytopenia. HIT score today remains low probability so I would still utilize heparin products and not send a HIT panel. We will look at his peripheral smear to rule out pseudothrombocytopenia. It seems most likely that he has a chronic thrombocytopenia that worsens when he has acute illnesses, like this hospitalization. We may need to delay the AV fistula placement if vascular surgery requires platelets to be over 50,000.

## 2015-06-12 NOTE — Progress Notes (Signed)
Subjective: Seen on dialysis feeling fairly well this morning. He had some nausea this morning that improved with one dose of zofran. He ate meals yesterday without any major problems.  Objective: Vital signs in last 24 hours: Filed Vitals:   06/12/15 1140 06/12/15 1156 06/12/15 1159 06/12/15 1253  BP: 96/69 108/65 142/78 117/80  Pulse: 62 65 73 64  Temp:  98 F (36.7 C)  98.2 F (36.8 C)  TempSrc:    Oral  Resp:  15  14  Height:      Weight:      SpO2:    99%   Weight change: -0.5 kg (-1 lb 1.6 oz)  Intake/Output Summary (Last 24 hours) at 06/12/15 1507 Last data filed at 06/12/15 1402  Gross per 24 hour  Intake    600 ml  Output      0 ml  Net    600 ml   GENERAL- alert, co-operative, NAD HEENT- RIJ HD catheter CARDIAC- RRR, no murmurs, rubs or gallops RESP- CTAB, no wheezes or crackles EXTREMITIES- RLE BKA, no pedal edema. SKIN- Warm, dry PSYCH- Normal mood and affect  Medications: I have reviewed the patient's current medications. Scheduled Meds: . amiodarone  200 mg Oral Daily  . famotidine  20 mg Oral Daily  . feeding supplement (NEPRO CARB STEADY)  237 mL Oral BID BM  . [START ON 06/14/2015] ferric gluconate (FERRLECIT/NULECIT) IV  125 mg Intravenous Q T,Th,Sa-HD  . ferumoxytol  510 mg Intravenous Once  . heparin subcutaneous  5,000 Units Subcutaneous 3 times per day  . insulin aspart  0-9 Units Subcutaneous TID WC  . ipratropium-albuterol  3 mL Nebulization Once  . levothyroxine  50 mcg Oral QAC breakfast  . multivitamin with minerals  1 tablet Oral Daily   Continuous Infusions:  PRN Meds:.docusate sodium, fluticasone, heparin, ondansetron Assessment/Plan: Symptomatic uremia: Getting HD today, symptoms and laboratory values responding appropriately. His nausea is improved and now he is tolerating more of a diet. Initially planned for AVF placement tomorrow plus tunneled cath. His worsening thrombocytopenia is still not entirely clear although he remains  asymptomatic with no signs of bleeding or thromboses. If it continues to worsen and makes him inappropriate for the surgery at this time he may need to consider tunneled cath placement for now and surgical evaluation at a later point. 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 Since admission 79->93->60->43. He is in a low risk category for HIT at this time due to his platelet count but timing and lack of symptoms are not supportive (no previous heparin in the past month or more). He does have chronic hepatitis C that is untreated but on abdominal imaging or clinical exam never showed evidence of cirrhosis. Acute illness could exacerbate thrombocytopenia due to this chronic viral infection. He had significant thrombocytopenia a year ago when evaluated inpatient for gastritis. However he does not have evidence of active infection mostly just uremia with ESRD. Qualitative platelet dysfunction would be more consistent. On review he has been taking amiodarone for the past year without acute drop in platelets. Hemolytic anemia and TTP are unlikely with isolated thrombocytopenia. ITP possible. -Obtain peripheral blood smear -Continue monitoring blood count  Constipation: Somewhat chronic, he attributes this to some of his medications at home. Mild abdominal discomfort yesterday.  Had a bowel movement this morning after taking colace and miralax x1 that was large and hard otherwise normal.  Atrial flutter: On eliquis PTA, holding for upcoming procedures currently on prophylactic dose heparin.  Diet: Renal/Carb mod DVT Prophylaxis: [place] heparin FULL CODE  Dispo: Disposition is deferred at this time, awaiting improvement of current medical problems. Anticipated discharge in approximately 2-3  day(s).   The patient does have a current PCP Doneen Poisson(Lawrence Klima, MD) and [facility] after discharge.   LOS: 4 days   Fuller Planhristopher W Johnaton Sonneborn, MD 06/12/2015, 3:07 PM

## 2015-06-12 NOTE — Progress Notes (Signed)
Assessment: 1 ESRD with uremic syndrome- improving.  For access surg possibly Wed. 3rd HD today.  Feeling better 3 Weight loss due to 1  4 CM EF 25-30% 5 Hep C pos viral load 6 A flutter - eliquis on hold, on amio/ BB. Bradycardic on HD today and BP's soft 7 Diabetes Mellitus w complications  Plan: as above. DC'd coreg for low HR.   Subjective: Interval History: no further   Objective: Vital signs in last 24 hours: Temp:  [98 F (36.7 C)-98.8 F (37.1 C)] 98.6 F (37 C) (03/21 0848) Pulse Rate:  [58-65] 58 (03/21 0912) Resp:  [14-20] 14 (03/21 0848) BP: (112-134)/(57-82) 118/69 mmHg (03/21 0912) SpO2:  [99 %-100 %] 100 % (03/21 0513) Weight:  [66 kg (145 lb 8.1 oz)-67.4 kg (148 lb 9.4 oz)] 67.4 kg (148 lb 9.4 oz) (03/21 0848) Weight change: -0.5 kg (-1 lb 1.6 oz)  Intake/Output from previous day: 03/20 0701 - 03/21 0700 In: 480 [P.O.:480] Out: 500  Intake/Output this shift:    General appearance: alert and cooperative Resp: clear to auscultation bilaterally Chest wall: no tenderness GI: soft, non-tender; bowel sounds normal; no masses,  no organomegaly Extremities: extremities normal, atraumatic, no cyanosis or edema  Lab Results:  Recent Labs  06/10/15 0451 06/12/15 0438  WBC 6.4 8.1  HGB 12.1* 12.4*  HCT 36.1* 37.4*  PLT 60* 43*   BMET:   Recent Labs  06/10/15 0451 06/11/15 0733  NA 136 136  K 4.1 4.3  CL 104 101  CO2 25 24  GLUCOSE 111* 111*  BUN 61* 72*  CREATININE 4.13* 4.34*  CALCIUM 8.3* 8.5*   No results for input(s): PTH in the last 72 hours. Iron Studies:   Recent Labs  06/12/15 0438  IRON 52  TIBC 302   Studies/Results: No results found.  Scheduled: . amiodarone  200 mg Oral Daily  . carvedilol  12.5 mg Oral BID WC  . famotidine  20 mg Oral Daily  . feeding supplement (NEPRO CARB STEADY)  237 mL Oral BID BM  . heparin  5,000 Units Subcutaneous 3 times per day  . insulin aspart  0-9 Units Subcutaneous TID WC  .  ipratropium-albuterol  3 mL Nebulization Once  . levothyroxine  50 mcg Oral QAC breakfast  . multivitamin with minerals  1 tablet Oral Daily     LOS: 4 days   [name] [name] D 06/12/2015,9:25 AM

## 2015-06-13 ENCOUNTER — Inpatient Hospital Stay (HOSPITAL_COMMUNITY): Payer: Medicare Other

## 2015-06-13 ENCOUNTER — Inpatient Hospital Stay (HOSPITAL_COMMUNITY)
Admission: AD | Disposition: A | Discharge status: Home Health | Payer: Medicare Other | Source: Ambulatory Visit | Attending: Student in an Organized Health Care Education/Training Program

## 2015-06-13 ENCOUNTER — Inpatient Hospital Stay (HOSPITAL_COMMUNITY): Payer: Medicare Other | Admitting: Anesthesiology

## 2015-06-13 HISTORY — PX: INSERTION OF DIALYSIS CATHETER: SHX1324

## 2015-06-13 HISTORY — PX: REMOVAL OF A DIALYSIS CATHETER: SHX6053

## 2015-06-13 LAB — CBC
HEMATOCRIT: 36.6 % — AB (ref 39.0–52.0)
HEMOGLOBIN: 12.1 g/dL — AB (ref 13.0–17.0)
MCH: 22.5 pg — ABNORMAL LOW (ref 26.0–34.0)
MCHC: 33.1 g/dL (ref 30.0–36.0)
MCV: 68 fL — AB (ref 78.0–100.0)
Platelets: 43 10*3/uL — ABNORMAL LOW (ref 150–400)
RBC: 5.38 MIL/uL (ref 4.22–5.81)
RDW: 15.9 % — ABNORMAL HIGH (ref 11.5–15.5)
WBC: 6.9 10*3/uL (ref 4.0–10.5)

## 2015-06-13 LAB — RENAL FUNCTION PANEL
ANION GAP: 13 (ref 5–15)
Albumin: 2.5 g/dL — ABNORMAL LOW (ref 3.5–5.0)
BUN: 36 mg/dL — ABNORMAL HIGH (ref 6–20)
CHLORIDE: 98 mmol/L — AB (ref 101–111)
CO2: 23 mmol/L (ref 22–32)
Calcium: 8.4 mg/dL — ABNORMAL LOW (ref 8.9–10.3)
Creatinine, Ser: 3.43 mg/dL — ABNORMAL HIGH (ref 0.61–1.24)
GFR, EST AFRICAN AMERICAN: 19 mL/min — AB (ref >60)
GFR, EST NON AFRICAN AMERICAN: 16 mL/min — AB (ref >60)
Glucose, Bld: 103 mg/dL — ABNORMAL HIGH (ref 65–99)
POTASSIUM: 5 mmol/L (ref 3.5–5.1)
Phosphorus: 2.7 mg/dL (ref 2.5–4.6)
Sodium: 134 mmol/L — ABNORMAL LOW (ref 135–145)

## 2015-06-13 LAB — GLUCOSE, CAPILLARY
GLUCOSE-CAPILLARY: 100 mg/dL — AB (ref 65–99)
GLUCOSE-CAPILLARY: 93 mg/dL (ref 65–99)
GLUCOSE-CAPILLARY: 129 mg/dL — AB (ref 65–99)
GLUCOSE-CAPILLARY: 120 mg/dL — AB (ref 65–99)
Glucose-Capillary: 242 mg/dL — ABNORMAL HIGH (ref 65–99)
Glucose-Capillary: 197 mg/dL — ABNORMAL HIGH (ref 65–99)

## 2015-06-13 LAB — SURGICAL PCR SCREEN
MRSA, PCR: NEGATIVE
Staphylococcus aureus: NEGATIVE

## 2015-06-13 LAB — SAVE SMEAR

## 2015-06-13 LAB — HIV ANTIBODY (ROUTINE TESTING W REFLEX): HIV Screen 4th Generation wRfx: NONREACTIVE

## 2015-06-13 LAB — PROTIME-INR
INR: 1.13 (ref 0.00–1.49)
Prothrombin Time: 14.7 seconds (ref 11.6–15.2)

## 2015-06-13 SURGERY — INSERTION OF DIALYSIS CATHETER
Anesthesia: Monitor Anesthesia Care | Site: Chest | Laterality: Right

## 2015-06-13 MED ORDER — FENTANYL CITRATE (PF) 100 MCG/2ML IJ SOLN: 25.0000 ug | INTRAMUSCULAR | Status: DC | PRN

## 2015-06-13 MED ORDER — VANCOMYCIN HCL IN DEXTROSE 1-5 GM/200ML-% IV SOLN
1000.0000 mg | INTRAVENOUS | Status: AC
  Administered 2015-06-13: 1000 mg via INTRAVENOUS

## 2015-06-13 MED ORDER — LIDOCAINE HCL (CARDIAC) 20 MG/ML IV SOLN
INTRAVENOUS | Status: DC | PRN
  Administered 2015-06-13: 50 mg via INTRATRACHEAL

## 2015-06-13 MED ORDER — SODIUM CHLORIDE 0.9 % IV SOLN
INTRAVENOUS | Status: DC | PRN
  Administered 2015-06-13: 10:00:00 via INTRAVENOUS

## 2015-06-13 MED ORDER — LIDOCAINE-EPINEPHRINE 0.5 %-1:200000 IJ SOLN
INTRAMUSCULAR | Status: AC
  Filled 2015-06-13: qty 1

## 2015-06-13 MED ORDER — FENTANYL CITRATE (PF) 250 MCG/5ML IJ SOLN
INTRAMUSCULAR | Status: DC | PRN
  Administered 2015-06-13: 50 ug via INTRAVENOUS

## 2015-06-13 MED ORDER — MIDAZOLAM HCL 2 MG/2ML IJ SOLN
INTRAMUSCULAR | Status: AC
  Filled 2015-06-13: qty 2

## 2015-06-13 MED ORDER — SODIUM CHLORIDE 0.9 % IV SOLN
INTRAVENOUS | Status: DC | PRN
  Administered 2015-06-13: 500 mL

## 2015-06-13 MED ORDER — HEPARIN SODIUM (PORCINE) 1000 UNIT/ML IJ SOLN
INTRAMUSCULAR | Status: DC | PRN
  Administered 2015-06-13: 4.6 mL

## 2015-06-13 MED ORDER — ONDANSETRON HCL 4 MG/2ML IJ SOLN
4.0000 mg | Freq: Once | INTRAMUSCULAR | Status: AC | PRN
  Administered 2015-06-13: 4 mg via INTRAVENOUS

## 2015-06-13 MED ORDER — PROPOFOL 10 MG/ML IV BOLUS
INTRAVENOUS | Status: DC | PRN
  Administered 2015-06-13: 10 mg via INTRAVENOUS

## 2015-06-13 MED ORDER — FENTANYL CITRATE (PF) 250 MCG/5ML IJ SOLN
INTRAMUSCULAR | Status: AC
  Filled 2015-06-13: qty 5

## 2015-06-13 MED ORDER — ONDANSETRON HCL 4 MG/2ML IJ SOLN
INTRAMUSCULAR | Status: AC
  Filled 2015-06-13: qty 2

## 2015-06-13 MED ORDER — MIDAZOLAM HCL 2 MG/2ML IJ SOLN
INTRAMUSCULAR | Status: DC | PRN
  Administered 2015-06-13: 1 mg via INTRAVENOUS

## 2015-06-13 MED ORDER — VANCOMYCIN HCL IN DEXTROSE 1-5 GM/200ML-% IV SOLN
INTRAVENOUS | Status: AC
  Filled 2015-06-13: qty 200

## 2015-06-13 MED ORDER — 0.9 % SODIUM CHLORIDE (POUR BTL) OPTIME
TOPICAL | Status: DC | PRN
  Administered 2015-06-13: 1000 mL

## 2015-06-13 MED ORDER — SODIUM CHLORIDE 0.9 % IV SOLN
250.0000 mg | INTRAVENOUS | Status: DC
  Administered 2015-06-14 – 2015-06-19 (×3): 250 mg via INTRAVENOUS
  Filled 2015-06-13 (×6): qty 20

## 2015-06-13 MED ORDER — LIDOCAINE-EPINEPHRINE 0.5 %-1:200000 IJ SOLN
INTRAMUSCULAR | Status: DC | PRN
  Administered 2015-06-13: 9 mL

## 2015-06-13 MED ORDER — HEPARIN SODIUM (PORCINE) 1000 UNIT/ML IJ SOLN
INTRAMUSCULAR | Status: AC
  Filled 2015-06-13: qty 1

## 2015-06-13 SURGICAL SUPPLY — 62 items
APL SKNCLS STERI-STRIP NONHPOA (GAUZE/BANDAGES/DRESSINGS) ×2
ARMBAND PINK RESTRICT EXTREMIT (MISCELLANEOUS) ×1 IMPLANT
BAG DECANTER FOR FLEXI CONT (MISCELLANEOUS) ×4 IMPLANT
BENZOIN TINCTURE PRP APPL 2/3 (GAUZE/BANDAGES/DRESSINGS) ×4 IMPLANT
BIOPATCH RED 1 DISK 7.0 (GAUZE/BANDAGES/DRESSINGS) ×3 IMPLANT
BIOPATCH RED 1IN DISK 7.0MM (GAUZE/BANDAGES/DRESSINGS) ×1
CANISTER SUCTION 2500CC (MISCELLANEOUS) ×4 IMPLANT
CANNULA VESSEL 3MM 2 BLNT TIP (CANNULA) IMPLANT
CATH CANNON HEMO 15FR 23CM (HEMODIALYSIS SUPPLIES) ×3 IMPLANT
CATH PALINDROME RT-P 15FX19CM (CATHETERS) IMPLANT
CATH PALINDROME RT-P 15FX23CM (CATHETERS) IMPLANT
CATH PALINDROME RT-P 15FX28CM (CATHETERS) IMPLANT
CATH PALINDROME RT-P 15FX55CM (CATHETERS) IMPLANT
CLIP LIGATING EXTRA MED SLVR (CLIP) ×1 IMPLANT
CLIP LIGATING EXTRA SM BLUE (MISCELLANEOUS) ×1 IMPLANT
CLOSURE WOUND 1/2 X4 (GAUZE/BANDAGES/DRESSINGS) ×1
COVER PROBE W GEL 5X96 (DRAPES) ×4 IMPLANT
DECANTER SPIKE VIAL GLASS SM (MISCELLANEOUS) ×4 IMPLANT
DRAPE C-ARM 42X72 X-RAY (DRAPES) ×4 IMPLANT
DRAPE CHEST BREAST 15X10 FENES (DRAPES) ×4 IMPLANT
ELECT REM PT RETURN 9FT ADLT (ELECTROSURGICAL) ×4
ELECTRODE REM PT RTRN 9FT ADLT (ELECTROSURGICAL) ×2 IMPLANT
GAUZE SPONGE 2X2 8PLY STRL LF (GAUZE/BANDAGES/DRESSINGS) ×2 IMPLANT
GAUZE SPONGE 4X4 12PLY STRL (GAUZE/BANDAGES/DRESSINGS) ×4 IMPLANT
GAUZE SPONGE 4X4 16PLY XRAY LF (GAUZE/BANDAGES/DRESSINGS) ×4 IMPLANT
GEL ULTRASOUND 20GR AQUASONIC (MISCELLANEOUS) IMPLANT
GLOVE BIO SURGEON STRL SZ 6.5 (GLOVE) ×2 IMPLANT
GLOVE BIO SURGEONS STRL SZ 6.5 (GLOVE) ×1
GLOVE BIOGEL PI IND STRL 6.5 (GLOVE) ×2 IMPLANT
GLOVE BIOGEL PI INDICATOR 6.5 (GLOVE) ×4
GLOVE SS BIOGEL STRL SZ 7.5 (GLOVE) ×2 IMPLANT
GLOVE SUPERSENSE BIOGEL SZ 7.5 (GLOVE) ×2
GOWN STRL REUS W/ TWL LRG LVL3 (GOWN DISPOSABLE) ×5 IMPLANT
GOWN STRL REUS W/TWL LRG LVL3 (GOWN DISPOSABLE) ×8
KIT BASIN OR (CUSTOM PROCEDURE TRAY) ×4 IMPLANT
KIT ROOM TURNOVER OR (KITS) ×4 IMPLANT
NDL 18GX1X1/2 (RX/OR ONLY) (NEEDLE) ×1 IMPLANT
NDL HYPO 25GX1X1/2 BEV (NEEDLE) ×1 IMPLANT
NEEDLE 18GX1X1/2 (RX/OR ONLY) (NEEDLE) ×4 IMPLANT
NEEDLE 22X1 1/2 (OR ONLY) (NEEDLE) ×4 IMPLANT
NEEDLE HYPO 25GX1X1/2 BEV (NEEDLE) ×4 IMPLANT
NS IRRIG 1000ML POUR BTL (IV SOLUTION) ×4 IMPLANT
PACK CV ACCESS (CUSTOM PROCEDURE TRAY) ×1 IMPLANT
PACK GENERAL/GYN (CUSTOM PROCEDURE TRAY) ×3 IMPLANT
PACK SURGICAL SETUP 50X90 (CUSTOM PROCEDURE TRAY) ×4 IMPLANT
PAD ARMBOARD 7.5X6 YLW CONV (MISCELLANEOUS) ×8 IMPLANT
SOAP 2 % CHG 4 OZ (WOUND CARE) ×4 IMPLANT
SPONGE GAUZE 2X2 STER 10/PKG (GAUZE/BANDAGES/DRESSINGS) ×2
STRIP CLOSURE SKIN 1/2X4 (GAUZE/BANDAGES/DRESSINGS) ×3 IMPLANT
SUT ETHILON 3 0 PS 1 (SUTURE) ×4 IMPLANT
SUT PROLENE 6 0 CC (SUTURE) ×1 IMPLANT
SUT VIC AB 3-0 SH 27 (SUTURE)
SUT VIC AB 3-0 SH 27X BRD (SUTURE) ×1 IMPLANT
SUT VICRYL 4-0 PS2 18IN ABS (SUTURE) ×4 IMPLANT
SYR 20CC LL (SYRINGE) ×4 IMPLANT
SYR 5ML LL (SYRINGE) ×8 IMPLANT
SYR CONTROL 10ML LL (SYRINGE) ×4 IMPLANT
SYRINGE 10CC LL (SYRINGE) ×4 IMPLANT
TAPE CLOTH SURG 4X10 WHT LF (GAUZE/BANDAGES/DRESSINGS) ×3 IMPLANT
TOWEL OR 17X26 10 PK STRL BLUE (TOWEL DISPOSABLE) ×3 IMPLANT
UNDERPAD 30X30 INCONTINENT (UNDERPADS AND DIAPERS) ×4 IMPLANT
WATER STERILE IRR 1000ML POUR (IV SOLUTION) ×1 IMPLANT

## 2015-06-13 NOTE — Progress Notes (Signed)
Physical Therapy Cancellation   Patient off unit for procedure; insertion of dialysis catheter. Will follow for formal PT evaluation when available, likely tomorrow.    06/13/15 1200  PT Visit Information  Last PT Received On 06/13/15  Reason Eval/Treat Not Completed Patient at procedure or test/unavailable     Charlsie MerlesLogan Secor Lanette Ell, PT [phone]

## 2015-06-13 NOTE — Op Note (Signed)
    OPERATIVE REPORT  DATE OF SURGERY: 06/13/2015  PATIENT: [name] [name], 70 y.o. male MRN: [id number]  DOB: 11/30/1945  PRE-OPERATIVE DIAGNOSIS: ESRD  POST-OPERATIVE DIAGNOSIS:  Same  PROCEDURE: Right IJ hemodialysis tunneled catheter  SURGEON:  Gretta Beganodd Takaya Hyslop, M.D.  PHYSICIAN ASSISTANT: Nurse  ANESTHESIA:  Local with sedation  EBL: Minimal ml  Total I/O In: 450 [I.V.:450] Out: 210 [Urine:200; Blood:10]  BLOOD ADMINISTERED: None  DRAINS: None  SPECIMEN: None  COUNTS CORRECT:  YES  PLAN OF CARE: PACU   PATIENT DISPOSITION:  PACU - hemodynamically stable  PROCEDURE DETAILS: Patient is new onset hemodialysis. He has a right IJ temporary catheter placed several days ago. He is scheduled today for a tunneled catheter and also right arm AV fistula. His platelet count is noted to be 40,000 and this dropped over the last day. Decision was made to exchange for a tunneled catheter and delay elective fistula creation until this platelet count is been stabilized.  Patient was placed in the supine position. The area of the right neck and existing catheter were prepped and draped in usual sterile fashion. The catheter was divided and a guidewire was passed through the existing catheter and the catheter was removed in its entirety. A dilator and peel-away sheath was passed over the guidewire and the dilator and guidewire were removed. A 23 cm split catheter was positioned with the tips in the level of the distal right atrium. The peel-away sheath was removed. The catheter was brought through subcutaneous tunnel and the 2 lm ports were attached. Both lumens flushed and aspirated easily and were locked with 1000 unit per cc heparin. C-arm was used to confirm that the tips were in the distal right atrium and that there was no kinking of the catheter. The catheter was secured to the skin with a 3-0 nylon stitch and the entry site was closed with a 4-0 subcuticular Vicryl stitch. The patient was  transferred to the recovery room this test in stable condition where chest x-ray is pending   Gretta Beganodd Idell Hissong, M.D. 06/13/2015 12:24 PM

## 2015-06-13 NOTE — Progress Notes (Signed)
Internal Medicine Attending:   I saw and examined the patient. I reviewed the resident's note and I agree with the resident's findings and plan as documented in the resident's note.  70 year old man with ESRD admitted for new start hemodialysis. He has had 3 sessions since admission and is symptomatically much improved. BUN has decreased down to 32 today. Current dialysis access is through a temporary non-tunneled right IJ HD catheter. He is planned for vascular surgery today to place a AV fistula as well as a tunneled HD catheter to use while the fistula matures. We are working on the process to place him in an outpatient HD center near his home.

## 2015-06-13 NOTE — Anesthesia Postprocedure Evaluation (Signed)
Anesthesia Post Note  Patient: Derrek GuWilliam R Beman  Procedure(s) Performed: Procedure(s) (LRB): INSERTION OF DIALYSIS CATHETER RIGHT INTERNAL JUGULAR (Right) REMOVAL OF A DIALYSIS CATHETER (Right)  Patient location during evaluation: PACU Anesthesia Type: MAC Level of consciousness: awake and alert Pain management: pain level controlled Vital Signs Assessment: post-procedure vital signs reviewed and stable Respiratory status: spontaneous breathing, nonlabored ventilation, respiratory function stable and patient connected to nasal cannula oxygen Cardiovascular status: stable and blood pressure returned to baseline Anesthetic complications: no    Last Vitals:  Filed Vitals:   06/13/15 1200 06/13/15 1215  BP: 115/75 135/90  Pulse:    Temp:  36.6 C  Resp:  16    Last Pain:  Filed Vitals:   06/13/15 1218  PainSc: 0-No pain                 Cecile HearingStephen Edward Turk

## 2015-06-13 NOTE — Anesthesia Preprocedure Evaluation (Addendum)
Anesthesia Evaluation  Patient identified by MRN, date of birth, ID band Patient awake    Reviewed: Allergy & Precautions, NPO status , Patient's Chart, lab work & pertinent test results  History of Anesthesia Complications Negative for: history of anesthetic complications  Airway Mallampati: II  TM Distance: >3 FB Neck ROM: Full    Dental  (+) Missing, Poor Dentition, Dental Advisory Given, Chipped,    Pulmonary former smoker,    Pulmonary exam normal breath sounds clear to auscultation       Cardiovascular hypertension, Pt. on medications + CAD, + Cardiac Stents (RCA DES 2006) and + Peripheral Vascular Disease  Normal cardiovascular exam+ dysrhythmias Atrial Fibrillation  Rhythm:Regular Rate:Normal  EF 60%, Normal stress 2015   Neuro/Psych negative neurological ROS  negative psych ROS   GI/Hepatic Neg liver ROS, GERD  Medicated,(+) Hepatitis -, C  Endo/Other  diabetes (left foot neuropathy), Type 2, Insulin DependentHypothyroidism   Renal/GU CRF, ESRF and DialysisRenal disease     Musculoskeletal  (+) Arthritis , Osteoarthritis,    Abdominal   Peds  Hematology  (+) Blood dyscrasia, anemia , Thrombocytopenia; Plt 43k today    Anesthesia Other Findings Day of surgery medications reviewed with the patient.  Reproductive/Obstetrics                         Anesthesia Physical Anesthesia Plan  ASA: III  Anesthesia Plan: MAC   Post-op Pain Management:    Induction: Intravenous  Airway Management Planned: Nasal Cannula  Additional Equipment:   Intra-op Plan:   Post-operative Plan:   Informed Consent: I have reviewed the patients History and Physical, chart, labs and discussed the procedure including the risks, benefits and alternatives for the proposed anesthesia with the patient or authorized representative who has indicated his/her understanding and acceptance.   Dental advisory  given  Plan Discussed with: CRNA and Anesthesiologist  Anesthesia Plan Comments: (Discussed risks/benefits/alternatives to MAC sedation including need for ventilatory support, hypotension, need for conversion to general anesthesia.  All patient questions answered.  Patient wished to proceed.  Will discuss Platelet count with surgeon.)        Anesthesia Quick Evaluation

## 2015-06-13 NOTE — Progress Notes (Signed)
Assessment: 1 ESRD with uremic syndrome- improving.  For access surg possibly Wed. 3rd HD today.  Feeling better. Got TDC today, no AVF due to low plts.  CLIP pending. Hopefully home before the end of the week if CLIP comes back soon.  2 Low platelets - will stop heparin products, order HIT screen 3 DM 2 - w complications 4 CM EF 25-30% 5 Hep C pos viral load 6 A flutter - eliquis on hold, on amio. Coreg dc'd for low BP / HR.    Plan: as above. HD tomorrow.  HIT screen. DC all heparin products.    Subjective: Interval History: no further   Objective: Vital signs in last 24 hours: Temp:  [97.5 F (36.4 C)-98.8 F (37.1 C)] 97.9 F (36.6 C) (03/22 1215) Pulse Rate:  [62] 62 (03/21 1627) Resp:  [16-18] 16 (03/22 1215) BP: (115-146)/(69-90) 135/90 mmHg (03/22 1215) SpO2:  [100 %] 100 % (03/22 0918) Weight:  [66.1 kg (145 lb 11.6 oz)] 66.1 kg (145 lb 11.6 oz) (03/22 45400633) Weight change: 1.4 kg (3 lb 1.4 oz)  Intake/Output from previous day: 03/21 0701 - 03/22 0700 In: 480 [P.O.:480] Out: 150 [Urine:150] Intake/Output this shift: Total I/O In: 450 [I.V.:450] Out: 410 [Urine:400; Blood:10]  General appearance: alert and cooperative Resp: clear to auscultation bilaterally Chest wall: no tenderness GI: soft, non-tender; bowel sounds normal; no masses,  no organomegaly Extremities: extremities normal, atraumatic, no cyanosis or edema  Lab Results:  Recent Labs  06/12/15 0438 06/13/15 0700  WBC 8.1 6.9  HGB 12.4* 12.1*  HCT 37.4* 36.6*  PLT 43* 43*   BMET:   Recent Labs  06/12/15 0859 06/13/15 0700  NA 134* 134*  K 4.2 5.0  CL 98* 98*  CO2 24 23  GLUCOSE 156* 103*  BUN 53* 36*  CREATININE 4.25* 3.43*  CALCIUM 8.6* 8.4*   No results for input(s): PTH in the last 72 hours. Iron Studies:   Recent Labs  06/12/15 0438  IRON 52  TIBC 302   Studies/Results: Dg Chest Port 1 View  06/13/2015  CLINICAL DATA:  Status post dialysis catheter placement EXAM:  PORTABLE CHEST 1 VIEW COMPARISON:  03/29/2015 FINDINGS: Cardiac shadow is mildly enlarged. A dialysis catheter is now noted in satisfactory position. No pneumothorax is noted. The lungs are clear. IMPRESSION: No complicating factors following dialysis catheter placement. Electronically Signed   By: Alcide CleverMark  Lukens M.D.   On: 06/13/2015 12:51   Dg Fluoro Guide Cv Line-no Report  06/13/2015  CLINICAL DATA:  FLOURO GUIDE CV LINE Fluoroscopy was utilized by the requesting physician.  No radiographic interpretation.    Scheduled: . amiodarone  200 mg Oral Daily  . famotidine  20 mg Oral Daily  . feeding supplement (NEPRO CARB STEADY)  237 mL Oral BID BM  . [START ON 06/14/2015] ferric gluconate (FERRLECIT/NULECIT) IV  250 mg Intravenous Q T,Th,Sa-HD  . heparin subcutaneous  5,000 Units Subcutaneous 3 times per day  . insulin aspart  0-9 Units Subcutaneous TID WC  . ipratropium-albuterol  3 mL Nebulization Once  . levothyroxine  50 mcg Oral QAC breakfast  . multivitamin with minerals  1 tablet Oral Daily  . ondansetron      . vancomycin         LOS: 5 days   [name] [name] D 06/13/2015,4:08 PM

## 2015-06-13 NOTE — Transfer of Care (Signed)
Immediate Anesthesia Transfer of Care Note  Patient: [name] [name]  Procedure(s) Performed: Procedure(s): INSERTION OF DIALYSIS CATHETER RIGHT INTERNAL JUGULAR (Right) REMOVAL OF A DIALYSIS CATHETER (Right)  Patient Location: PACU  Anesthesia Type:MAC  Level of Consciousness: awake, alert , oriented and sedated  Airway & Oxygen Therapy: Patient Spontanous Breathing and Patient connected to nasal cannula oxygen  Post-op Assessment: Report given to RN, Post -op Vital signs reviewed and stable and Patient moving all extremities  Post vital signs: Reviewed and stable  Last Vitals:  Filed Vitals:   06/13/15 0633 06/13/15 0918  BP: 121/80 128/81  Pulse:    Temp: 37.1 C 37.1 C  Resp: 18 18    Complications: No apparent anesthesia complications

## 2015-06-13 NOTE — Progress Notes (Signed)
   Subjective: Tolerated dialysis well yesterday, appetite is improved but NPO after midnight anticipating surgery this morning. He did not feel nausea this morning.  Objective: Vital signs in last 24 hours: Filed Vitals:   06/12/15 1253 06/12/15 1627 06/13/15 0633 06/13/15 0918  BP: 117/80 119/69 121/80 128/81  Pulse: 64 62    Temp: 98.2 F (36.8 C) 98.8 F (37.1 C) 98.8 F (37.1 C) 98.7 F (37.1 C)  TempSrc: Oral Oral Oral Oral  Resp: 14 18 18 18   Height:      Weight:   66.1 kg (145 lb 11.6 oz)   SpO2: 99% 100% 100% 100%   Weight change: 1.4 kg (3 lb 1.4 oz)  Intake/Output Summary (Last 24 hours) at 06/13/15 0955 Last data filed at 06/13/15 0853  Gross per 24 hour  Intake    480 ml  Output    350 ml  Net    130 ml   GENERAL- alert, co-operative, NAD HEENT- RIJ HD catheter CARDIAC- RRR, no murmurs, rubs or gallops RESP- CTAB, no wheezes or crackles EXTREMITIES- RLE BKA, no pedal edema. SKIN- Warm, dry PSYCH- Normal mood and affect  Medications: I have reviewed the patient's current medications. Scheduled Meds: . [MAR Hold] amiodarone  200 mg Oral Daily  . [MAR Hold] famotidine  20 mg Oral Daily  . [MAR Hold] feeding supplement (NEPRO CARB STEADY)  237 mL Oral BID BM  . [MAR Hold] ferric gluconate (FERRLECIT/NULECIT) IV  125 mg Intravenous Q T,Th,Sa-HD  . ferumoxytol  510 mg Intravenous Once  . [MAR Hold] heparin subcutaneous  5,000 Units Subcutaneous 3 times per day  . [MAR Hold] insulin aspart  0-9 Units Subcutaneous TID WC  . [MAR Hold] ipratropium-albuterol  3 mL Nebulization Once  . [MAR Hold] levothyroxine  50 mcg Oral QAC breakfast  . [MAR Hold] multivitamin with minerals  1 tablet Oral Daily  . vancomycin      . vancomycin  1,000 mg Intravenous On Call to OR   Continuous Infusions:  PRN Meds:.[MAR Hold] docusate sodium, [MAR Hold] fluticasone, [MAR Hold] heparin, [MAR Hold] ondansetron Assessment/Plan: Symptomatic uremia: Symptoms resolving, now  tolerating good oral intake and not requiring much medication for nausea. Tolerating dialysis well for 3 sessions. New access to be placed today assuming no complications. 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 Since admission 79->93->60->43->43. The most probably cause of his isolated thrombocytopenia is stress from his illness and new onset of hemodialysis in the setting of very poor nutrition and chronic hepatitis C. Exact etiology is still somewhat unclear and we will review peripheral blood smear today. -Peripheral blood smear -Continue monitoring blood count  Atrial flutter: On eliquis PTA, holding for upcoming procedures currently on prophylactic dose heparin. Now that he is progressed to ESRD coumadin would be a more appropriate anticoagulation option.  Diet: Renal/Carb mod DVT Prophylaxis: [place] heparin FULL CODE  Dispo: Disposition is deferred at this time, awaiting improvement of current medical problems. Anticipated discharge in approximately 1-3 day(s).   The patient does have a current PCP Doneen Poisson(Lawrence Klima, MD) and [facility] after discharge.   LOS: 5 days   Fuller Planhristopher W Jaylean Buenaventura, MD 06/13/2015, 9:55 AM

## 2015-06-13 NOTE — Interval H&P Note (Signed)
History and Physical Interval Note:  06/13/2015 10:32 AM  [name] [name]  has presented today for surgery, with the diagnosis of End Stage Renal Disease N18.6  The various methods of treatment have been discussed with the patient and family. After consideration of risks, benefits and other options for treatment, the patient has consented to  Procedure(s): INSERTION OF DIALYSIS CATHETER (N/A) as a surgical intervention .  The patient's history has been reviewed, patient examined, no change in status, stable for surgery.  I have reviewed the patient's chart and labs.  Questions were answered to the patient's satisfaction.     Gretta BeganEarly, Lilu Mcglown

## 2015-06-13 NOTE — H&P (View-Only) (Signed)
Hospital Consult    Reason for Consult:  In need of [facility] and permanent HD access Referring Physician:  Arlean Hopping  MRN #:  [id number]  History of Present Illness: This is a 70 y.o. 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  He is on insulin for his DM.  He does have a hx of CAD with RCA stenting in 2006 and has chronic systolic heart failure with estimated EF of 25-30% on echo earlier this year.  He has Hepatitis C.  He is on synthroid for hypothyroidism.    He had been on Eliquis for A flutter, but has been off this since admission.  He did have an EP study w/o ablation.  He is also on amiodarone.    He did have a temporary right IJ catheter placed in IR.  [facility] has been consulted for [facility] and permanent HD access.  The pt is right handed.    He does have a thrombocytopenia with platelet count of 60k yesterday (06/10/15) and this is trending downward over the past few days.  He is on SQ heparin.   Past Medical History  Diagnosis Date  . Hyperlipidemia 04/02/2006  . Essential hypertension 12/09/2006  . Benign prostatic hypertrophy with nocturia 07/07/2006  . Chronic venous insufficiency 04/12/2010  . Obesity (BMI 30.0-34.9) 01/15/2012  . Constipation 05/25/2009    Intermittent   . Microcytic normochromic anemia 05/27/2006  . Internal and external hemorrhoids without complication 08/20/2012  . Coronary artery disease 04/02/2006    s/p RCA DES 2006, low risk myoview in 2011  . 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 Type 2 diabetes mellitus with peripheral artery disease (HCC) 05/27/2006    Absent pulses in the left foot   . Closed displaced  fracture of left femoral neck (HCC) 06/14/2014    s/p left hip hemiarthroplasty June 14, 2014   . Atrial flutter (HCC) 07/26/2014    a. May 2016, CHA2DS2VASc = 4 -> Eliquis, spontaneous conversion to NSR;  b. On amio;  c. 01/2015 EPS: Unable to induce right sided Aflutter. Non-sustained Afib and Left sided Aflutter noted.  [name] GERD (gastroesophageal reflux disease)   . Type 2 diabetes mellitus with stage 4 chronic kidney disease (HCC) 08/06/2012  . History of blood transfusion 05/2014    "related to hip OR"  . Degenerative joint disease involving multiple joints 02/02/2007  . Arthritis     "fingers" (02/07/2015)  . Nausea and vomiting 06/01/2015    Past Surgical History  Procedure Laterality Date  . Pilonidal cyst excision  1990's  . Refractive surgery Bilateral   . Fracture surgery    . Total hip arthroplasty Left 06/14/2014    Procedure: HEMI HIP ARTHROPLASTY ANTERIOR APPROACH;  Surgeon: Tarry Kos, MD;  Location: [facility] OR;  Service: Orthopedics;  Laterality: Left;  . Joint replacement    . Leg amputation above knee Right ~ 2008  . Cholecystectomy N/A 12/12/2014    Procedure: LAPAROSCOPIC CHOLECYSTECTOMY WITH INTRAOPERATIVE CHOLANGIOGRAM;  Surgeon: Gaynelle Adu, MD;  Location: [facility] OR;  Service: General;  Laterality: N/A;  . Electrophysiologic study N/A 02/07/2015  Procedure: A-Flutter Ablation;  Surgeon: Marinus Maw, MD;  Location: [facility] INVASIVE CV LAB;  Service: Cardiovascular;  Laterality: N/A;  . Tonsillectomy    . Coronary angioplasty with stent placement    . Prostate biopsy  ~ 2013    Allergies  Allergen Reactions  . Amoxicillin Swelling and Other (See Comments)    Puffy eyes and abdominal pain with Amoxicillin PO  . Lisinopril Other (See Comments)    Acute kidney injury  . Pravastatin Other (See Comments)    Weakness and fatigue  . Tamsulosin Other (See Comments)    REACTION: dry throat, sweating, blurred vision  . Doxycycline Rash and Other (See Comments)    Questionable  drug rxn rash    Prior to Admission medications   Medication Sig Start Date End Date Taking? Authorizing Provider  amiodarone (PACERONE) 200 MG tablet Take 1 tablet (200 mg total) by mouth daily. 01/19/15  Yes Doneen Poisson, MD  amLODipine (NORVASC) 5 MG tablet Take 1 tablet (5 mg total) by mouth daily. 04/16/15  Yes Rhonda G Barrett, PA-C  calcium citrate-vitamin D (CITRACAL+D) 315-200 MG-UNIT per tablet Take 1 tablet by mouth daily.    Yes Historical Provider, MD  carvedilol (COREG) 12.5 MG tablet Take 1 tablet (12.5 mg total) by mouth 2 (two) times daily with a meal. 01/19/15  Yes Doneen Poisson, MD  Darbepoetin Alfa (ARANESP) 100 MCG/0.5ML SOSY injection Inject 0.5 mLs (100 mcg total) into the skin every 7 (seven) days. 04/13/15  Yes Jana Half, MD  docusate sodium (COLACE) 100 MG capsule Take 100 mg by mouth at bedtime as needed for moderate constipation (constipation). Reported on 04/20/2015   Yes Historical Provider, MD  ELIQUIS 5 MG TABS tablet Take 5 mg by mouth 2 (two) times daily. 01/15/15  Yes Historical Provider, MD  famotidine (PEPCID) 20 MG tablet Take 20 mg before breakfast and 20 mg before supper 04/16/15  Yes Rhonda G Barrett, PA-C  fish oil-omega-3 fatty acids 1000 MG capsule Take 1 g by mouth daily. 01/15/12  Yes Doneen Poisson, MD  fluticasone (FLONASE) 50 MCG/ACT nasal spray Place 1 spray into both nostrils daily as needed for allergies. 01/19/15  Yes Doneen Poisson, MD  furosemide (LASIX) 20 MG tablet Take 3 tablets (60 mg total) by mouth daily. 04/10/15  Yes Jana Half, MD  hydrALAZINE (APRESOLINE) 10 MG tablet Take 1 tablet (10 mg total) by mouth every 8 (eight) hours. 04/02/15  Yes Ruben Im, MD  hydroxypropyl methylcellulose (ISOPTO TEARS) 2.5 % ophthalmic solution Place 2 drops into both eyes 4 (four) times daily as needed for dry eyes. Reported on 04/20/2015   Yes Historical Provider, MD  isosorbide mononitrate (IMDUR) 30 MG 24 hr tablet Take 1 tablet (30 mg  total) by mouth daily. 05/29/15  Yes Doneen Poisson, MD  levothyroxine (SYNTHROID, LEVOTHROID) 25 MCG tablet Take 1 tablet (25 mcg total) by mouth daily before breakfast. 06/01/15  Yes Doneen Poisson, MD  Multiple Vitamins-Minerals (MULTIVITAMIN WITH MINERALS) tablet Take 1 tablet by mouth daily.   Yes Historical Provider, MD  ondansetron (ZOFRAN) 4 MG tablet Take 1 tablet (4 mg total) by mouth every 8 (eight) hours as needed for nausea or vomiting. 06/01/15  Yes Selina Cooley, MD  terazosin (HYTRIN) 5 MG capsule Take 1 capsule (5 mg total) by mouth at bedtime. 05/29/15  Yes Doneen Poisson, MD    Social History   Social History  . Marital Status: Married    Spouse Name: N/A  . Number  of Children: N/A  . Years of Education: N/A   Occupational History  . Not on file.   Social History Main Topics  . Smoking status: Former Smoker -- 1.00 packs/day for 10 years    Types: Cigarettes    Quit date: 06/16/1968  . Smokeless tobacco: Never Used  . Alcohol Use: No     Comment: "quit alcohol in the 1960's"  . Drug Use: No  . Sexual Activity: Not Currently    Birth Control/ Protection: None   Other Topics Concern  . Not on file   Social History Narrative     Family History  Problem Relation Age of Onset  . Hypertension Mother   . Heart attack Father   . Breast cancer Sister   . Arthritis Sister   . Heart attack Brother   . Diabetes Brother   . Stroke Brother   . Pneumonia Daughter   . Diabetes Brother   . Alcoholism Brother   . Diabetes Brother   . Arthritis Sister     Bilateral knee replacement  . HIV Daughter   . Hypertension Daughter   . Drug abuse Daughter   . Schizophrenia Daughter   . Hypertension Daughter   . Drug abuse Daughter   . Bipolar disorder Daughter     ROS: [x]  Positive   [ ]  Negative   [ ]  All sytems reviewed and are negative  Cardiovascular: []  chest pain/pressure [x]  hx RCA stent placement 2006 [x]  CM with decreased EF [x]  A flutter []  SOB lying  flat []  DOE []  pain in legs while walking []  pain in legs at rest []  pain in legs at night []  non-healing ulcers []  hx of DVT []  swelling in legs  Pulmonary: []  productive cough []  asthma/wheezing []  home O2  Neurologic: []  weakness in []  arms []  legs []  numbness in []  arms []  legs []  hx of CVA []  mini stroke [] difficulty speaking or slurred speech []  temporary loss of vision in one eye []  dizziness  Hematologic: []  hx of cancer []  bleeding problems []  problems with blood clotting easily [x]  anemia [x]  thrombocytopenia [x]  hepatitis C  Endocrine:   [x]  diabetes [x]  thyroid disease  GI []  vomiting blood3 []  blood in stool [x]  N/V [x]  GERD  GU: [x]  CKD/renal failure [x]  HD--[]  M/W/F or []  T/T/S []  burning with urination []  blood in urine  Psychiatric: []  anxiety []  depression  Musculoskeletal: [x]  arthritis [x]  hx left hip hemiarthroplasty [x]  hx right AKA (after failed BKA) [x]  DDD []  joint pain  Integumentary: []  rashes []  ulcers  Constitutional: []  fever []  chills   Physical Examination  Filed Vitals:   06/11/15 0930 06/11/15 1000  BP: 115/69 114/57  Pulse: 60 60  Temp: 98 F (36.7 C)   Resp: 20 20   Body mass index is 22.13 kg/(m^2).  General:  WDWN in NAD Gait: Not observed HENT: WNL, normocephalic Pulmonary: normal non-labored breathing, without Rales, rhonchi,  wheezing Cardiac: regular, without  Murmurs, rubs or gallops Abdomen:  soft, NT/ND, no masses Skin: without rashes Vascular Exam/Pulses:  Right Left  Radial absent absent  Ulnar absent absent  Femoral 2+ (normal) 2+ (normal)  DP AKA Unable to palpate   PT AKA Unable to palpate    Extremities: without ischemic changes, without Gangrene , without cellulitis; without open wounds; well healed right AKA Musculoskeletal: no muscle wasting or atrophy  Neurologic: MOTOR FUNCTION:  moving all extremities equally. Speech is fluent/normal Psychiatric:  Normal  affect  CBC    Component Value Date/Time   WBC 6.4 06/10/2015 0451   WBC 6.0 10/26/2014 1356   RBC 5.23 06/10/2015 0451   RBC 3.86* 10/26/2014 1356   RBC 3.83* 06/22/2014 0600   HGB 12.1* 06/10/2015 0451   HCT 36.1* 06/10/2015 0451   HCT 28.5* 10/26/2014 1356   PLT 60* 06/10/2015 0451   PLT 141* 10/26/2014 1356   MCV 69.0* 06/10/2015 0451   MCV 74* 10/26/2014 1356   MCH 23.1* 06/10/2015 0451   MCH 23.6* 10/26/2014 1356   MCHC 33.5 06/10/2015 0451   MCHC 31.9 10/26/2014 1356   RDW 16.4* 06/10/2015 0451   RDW 18.9* 10/26/2014 1356   LYMPHSABS 1.6 06/08/2015 1141   MONOABS 0.5 06/08/2015 1141   EOSABS 0.1 06/08/2015 1141   BASOSABS 0.0 06/08/2015 1141    BMET    Component Value Date/Time   NA 136 06/11/2015 0733   NA 139 04/10/2015 1538   K 4.3 06/11/2015 0733   CL 101 06/11/2015 0733   CO2 24 06/11/2015 0733   GLUCOSE 111* 06/11/2015 0733   GLUCOSE 160* 04/10/2015 1538   BUN 72* 06/11/2015 0733   BUN 84* 04/10/2015 1538   CREATININE 4.34* 06/11/2015 0733   CREATININE 3.32* 02/02/2015 0937   CALCIUM 8.5* 06/11/2015 0733   GFRNONAA 12* 06/11/2015 0733   GFRNONAA 40* 08/22/2014 1439   GFRAA 14* 06/11/2015 0733   GFRAA 46* 08/22/2014 1439    COAGS: Lab Results  Component Value Date   INR 1.39 12/12/2014   INR 1.67* 12/11/2014   INR 1.21 07/25/2014     Non-Invasive Vascular Imaging:   Bilateral upper extremity vein mapping 06/10/15: Cephalic-Right  Segment Diameter Depth Comment  1. Axilla 3.20mm 2mm   2. Mid upper arm 4.8mm 2.53mm   3. Above AC 4.12mm 2mm   4. In [facility] 4.52mm 2.58mm   5. Below AC 4.8mm 4.13mm Multiple branches  6. Mid forearm 4.59mm 3.65mm   7. Wrist 3.38mm 1.76mm        Cephalic-Left  Segment Diameter Depth Comment  1. Axilla 2.69mm 3.13mm   2. Mid upper arm 2.70mm 3.8mm   3. Above AC 3.31mm 5.70mm   4. In AC 3.59mm mm   5. Below AC 2.60mm 5.22mm   6. Mid forearm 2.78mm 3.28mm   7.  Wrist 2.78mm 2.52mm    Basilic-Left  Segment Diameter Depth Comment  1. Axilla mm mm   2. Origin 4mm mm   3. Above AC 6.62mm 9.80mm   4. In [facility] 3.49mm 4.21mm   5. Below AC 2.47mm 2.85mm   6. Mid forearm 2.33mm 2.71mm   7. Wrist 2.29mm 1.83mm Branch         Statin:  No. Beta Blocker:  Yes.   Aspirin:  No. ACEI:  No. ARB:  No. Other antiplatelets/anticoagulants:  Yes.   Eliquis (last dose 06/08/15).  Now on SQ heparin   ASSESSMENT/PLAN: This is a 70 y.o. male with ESRD in need of  Permanent HD access and TDC placement.   -pt is right handed, however, the veins in the right arm are superior to the vein in the left arm.  His radial pulses are not palpable and therefore, would start with a right brachiocephalic AVF.   He will be at risk for steal given his absent radial pulse.   -he does have a worsening thrombocytopenia with a platelet count of 60k yesterday.  He is on amiodarone, which could be affecting his platelets.  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  Will defer to primary team. -will restrict his right arm and please move IV to left arm.   -will schedule for Wednesday.    Doreatha Massed, PA-C [facility] [phone]  I have examined the patient, reviewed and agree with above. Discussed option for hemodialysis the patient and his wife present. Recommend right arm AV fistula. Marginal size of cephalic vein on the left. He does have normal brachial pulse but absent radial pulse bilaterally. Severe peripheral vascular disease with prior right above-knee amputation.   Will plan R AVF and tunnelled cath on Wed if medically stableEarly, Tawanna Cooler, MD 06/11/2015 1:32 PM

## 2015-06-14 ENCOUNTER — Encounter (HOSPITAL_COMMUNITY): Payer: Self-pay | Admitting: Vascular Surgery

## 2015-06-14 LAB — HEPATITIS C VRS RNA DETECT BY PCR-QUAL: Hepatitis C Vrs RNA by PCR-Qual: POSITIVE — AB

## 2015-06-14 LAB — GLUCOSE, CAPILLARY
GLUCOSE-CAPILLARY: 148 mg/dL — AB (ref 65–99)
GLUCOSE-CAPILLARY: 249 mg/dL — AB (ref 65–99)
GLUCOSE-CAPILLARY: 119 mg/dL — AB (ref 65–99)
Glucose-Capillary: 235 mg/dL — ABNORMAL HIGH (ref 65–99)

## 2015-06-14 LAB — CBC
HCT: 35.6 % — ABNORMAL LOW (ref 39.0–52.0)
HEMOGLOBIN: 11.7 g/dL — AB (ref 13.0–17.0)
MCH: 22.6 pg — ABNORMAL LOW (ref 26.0–34.0)
MCHC: 32.9 g/dL (ref 30.0–36.0)
MCV: 68.9 fL — AB (ref 78.0–100.0)
PLATELETS: 48 10*3/uL — AB (ref 150–400)
RBC: 5.17 MIL/uL (ref 4.22–5.81)
RDW: 16 % — ABNORMAL HIGH (ref 11.5–15.5)
WBC: 6.2 10*3/uL (ref 4.0–10.5)

## 2015-06-14 LAB — HEPARIN INDUCED PLATELET AB (HIT ANTIBODY): Heparin Induced Plt Ab: 0.542 {OD_unit} — ABNORMAL HIGH (ref 0.000–0.400)

## 2015-06-14 MED ORDER — ONDANSETRON 4 MG PO TBDP: 4.0000 mg | ORAL_TABLET | Freq: Three times a day (TID) | ORAL | Status: DC | PRN

## 2015-06-14 MED ORDER — PATIENT'S GUIDE TO USING COUMADIN BOOK
Freq: Once | Status: DC
  Filled 2015-06-14: qty 1

## 2015-06-14 MED ORDER — PENTAFLUOROPROP-TETRAFLUOROETH EX AERO: 1.0000 "application " | INHALATION_SPRAY | CUTANEOUS | Status: DC | PRN

## 2015-06-14 MED ORDER — HEPARIN SODIUM (PORCINE) 1000 UNIT/ML DIALYSIS: 1000.0000 [IU] | INTRAMUSCULAR | Status: DC | PRN

## 2015-06-14 MED ORDER — ALTEPLASE 2 MG IJ SOLR
2.0000 mg | Freq: Once | INTRAMUSCULAR | Status: DC | PRN
Start: 2015-06-14 — End: 2015-06-14

## 2015-06-14 MED ORDER — LIDOCAINE HCL (PF) 1 % IJ SOLN: 5.0000 mL | INTRAMUSCULAR | Status: DC | PRN

## 2015-06-14 MED ORDER — RENA-VITE PO TABS
1.0000 | ORAL_TABLET | Freq: Every day | ORAL | Status: DC
  Administered 2015-06-15 – 2015-06-18 (×4): 1 via ORAL
  Filled 2015-06-14 (×4): qty 1

## 2015-06-14 MED ORDER — WARFARIN VIDEO: Freq: Once | Status: DC

## 2015-06-14 MED ORDER — WARFARIN - PHARMACIST DOSING INPATIENT: Freq: Every day | Status: DC

## 2015-06-14 MED ORDER — SODIUM CHLORIDE 0.9 % IV SOLN: 100.0000 mL | INTRAVENOUS | Status: DC | PRN

## 2015-06-14 MED ORDER — ANTICOAGULANT SODIUM CITRATE 4% (200MG/5ML) IV SOLN
5.0000 mL | Status: AC
  Administered 2015-06-14: 5 mL via INTRAVENOUS
  Filled 2015-06-14: qty 250

## 2015-06-14 MED ORDER — APIXABAN 5 MG PO TABS: 5.0000 mg | ORAL_TABLET | Freq: Two times a day (BID) | ORAL | Status: DC

## 2015-06-14 MED ORDER — WARFARIN SODIUM 2.5 MG PO TABS: 2.5000 mg | ORAL_TABLET | Freq: Once | ORAL | Status: DC

## 2015-06-14 MED ORDER — LIDOCAINE-PRILOCAINE 2.5-2.5 % EX CREA: 1.0000 "application " | TOPICAL_CREAM | CUTANEOUS | Status: DC | PRN

## 2015-06-14 NOTE — Progress Notes (Signed)
Internal Medicine Attending:   I saw and examined the patient. I reviewed the resident's note and I agree with the resident's findings and plan as documented in the resident's note.  70 year old man with ESRD admitted for new start hemodialysis who is currently doing well with much improved symptoms of uremia. We saw him today in the dialysis center receiving his fourth HD treatments. He has now a tunneled right sided IJ HD line which is working well with no signs of bleeding or hematoma. It sounds like the CLIP process is going well and he may have outpatient HD options soon.

## 2015-06-14 NOTE — Progress Notes (Addendum)
ANTICOAGULATION CONSULT NOTE - Initial Consult  Pharmacy Consult for Coumadin->Eliquis -> hold for today Indication: atrial fibrillation  Allergies  Allergen Reactions  . Amoxicillin Swelling and Other (See Comments)    Puffy eyes and abdominal pain with Amoxicillin PO  . Lisinopril Other (See Comments)    Acute kidney injury  . Pravastatin Other (See Comments)    Weakness and fatigue  . Tamsulosin Other (See Comments)    REACTION: dry throat, sweating, blurred vision  . Doxycycline Rash and Other (See Comments)    Questionable drug rxn rash    Patient Measurements: Height: 5\' 8"  (172.7 cm) Weight: 147 lb 0.8 oz (66.7 kg) IBW/kg (Calculated) : 68.4  Vital Signs: Temp: 98.6 F (37 C) (03/23 1045) Temp Source: Oral (03/23 1045) BP: 140/70 mmHg (03/23 1045) Pulse Rate: 71 (03/23 1045)  Labs:  Recent Labs  06/12/15 0438 06/12/15 0859 06/12/15 1242 06/13/15 0700 06/13/15 0934 06/14/15 0625  HGB 12.4*  --   --  12.1*  --  11.7*  HCT 37.4*  --   --  36.6*  --  35.6*  PLT 43*  --   --  43*  --  48*  APTT  --   --  40*  --   --   --   LABPROT  --   --  15.5*  --  14.7  --   INR  --   --  1.21  --  1.13  --   CREATININE  --  4.25*  --  3.43*  --   --     Estimated Creatinine Clearance: 18.4 mL/min (by C-G formula based on Cr of 3.43).   Medical History: Past Medical History  Diagnosis Date  . Hyperlipidemia 04/02/2006  . Essential hypertension 12/09/2006  . Benign prostatic hypertrophy with nocturia 07/07/2006  . Chronic venous insufficiency 04/12/2010  . Obesity (BMI 30.0-34.9) 01/15/2012  . Constipation 05/25/2009    Intermittent   . Microcytic normochromic anemia 05/27/2006  . Internal and external hemorrhoids without complication 08/20/2012  . Coronary artery disease 04/02/2006    s/p RCA DES 2006, low risk myoview in 2011  . 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 Type 2 diabetes mellitus with  ophthalmic manifestations (HCC) 04/02/2006    s/p laser surgery for severe diabetic  bilateral non-proliferative retinopathy (2013)    . Type 2 diabetes mellitus with peripheral artery disease (HCC) 05/27/2006    Absent pulses in the left foot   . Closed displaced fracture of left femoral neck (HCC) 06/14/2014    s/p left hip hemiarthroplasty June 14, 2014   . Atrial flutter (HCC) 07/26/2014    a. May 2016, CHA2DS2VASc = 4 -> Eliquis, spontaneous conversion to NSR;  b. On amio;  c. 01/2015 EPS: Unable to induce right sided Aflutter. Non-sustained Afib and Left sided Aflutter noted.  [name]. GERD (gastroesophageal reflux disease)   . Type 2 diabetes mellitus with stage 4 chronic kidney disease (HCC) 08/06/2012  . History of blood transfusion 05/2014    "related to hip OR"  . Degenerative joint disease involving multiple joints 02/02/2007  . Arthritis     "fingers" (02/07/2015)  . Nausea and vomiting 06/01/2015   Assessment:   70 yr old male with initial plans to resume anticoagulation for atrial fibrillation.  Had been on Eliquis prior to admission, which had been held with plan for AV fistula on 06/13/15. But, placed tunneled dialysis catheter  instead, due to drop in platelet count. Now planning AVF when platelet count recovers/more stable, probably as outpatient in a few weeks.   Discussed with Dr. Dimple Casey, who discussed with Drs. Ellan Lambert and Tax adviser. Rx was initially asked to convert to Coumadin due to new ESRD, which I discussed with patient. Now to delay at least until tomorrow. Oral anticoagulation will need to be held prior to AV fistula.  .    Platelet count 79K on admit 06/08/15. Heparin 5000 units sq q8hrs begun on 05/1915, and stopped on 3/22 with platelet count down to 43K.   HIT antibody sent 3/22 pm, and just resulted positive this afternoon.  SRA ordered for confirmation, but thought to be low likelihood for true HIT, and SRA cancelled per Internal Medicine.   Platelet count 48K today.    No bleeding reported.  Goal of Therapy:  INR 2-3 Monitor platelets by anticoagulation protocol: Yes   Plan:   Delay oral anticoagulation today per IMTS.  Per Dr. Dimple Casey, may begin Coumadin tomorrow.  Will follow up plans.  Informed patient and wife.  Follow up platelet count, follow up for any bleeding.  Dennie Fetters, [place] Pager: [phone] 06/14/2015,3:44 PM

## 2015-06-14 NOTE — Progress Notes (Signed)
Assessment: 1 ESRD with uremic syndrome- improving. HD today 2 Low platelets - dc'd heparin products, ordered HIT screen. Slightly better 48k 3 DM 2 - w complications 4 CM EF 25-30% 5 Hep C pos viral load 6 A flutter - eliquis on hold, on amio. Coreg dc'd for low BP / HR.    Plan: no heparin products, HIT pending. Hopefully plts will improve and can have perm access placed, if not may have to defer to OP setting for perm access placement. Should know about CLIP today or tomorrow.   Subjective: Interval History: no further   Objective: Vital signs in last 24 hours: Temp:  [98.3 F (36.8 C)-98.7 F (37.1 C)] 98.6 F (37 C) (03/23 1045) Pulse Rate:  [47-71] 71 (03/23 1045) Resp:  [18-20] 19 (03/23 1045) BP: (104-142)/(62-82) 140/70 mmHg (03/23 1045) SpO2:  [100 %] 100 % (03/23 1045) Weight:  [66.3 kg (146 lb 2.6 oz)-66.7 kg (147 lb 0.8 oz)] 66.7 kg (147 lb 0.8 oz) (03/23 1025) Weight change: -1.1 kg (-2 lb 6.8 oz)  Intake/Output from previous day: 03/22 0701 - 03/23 0700 In: 930 [P.O.:480; I.V.:450] Out: 685 [Urine:675; Blood:10] Intake/Output this shift:    General appearance: alert and cooperative Resp: clear to auscultation bilaterally Chest wall: no tenderness GI: soft, non-tender; bowel sounds normal; no masses,  no organomegaly Extremities: extremities normal, atraumatic, no cyanosis or edema  Lab Results:  Recent Labs  06/13/15 0700 06/14/15 0625  WBC 6.9 6.2  HGB 12.1* 11.7*  HCT 36.6* 35.6*  PLT 43* 48*   BMET:   Recent Labs  06/12/15 0859 06/13/15 0700  NA 134* 134*  K 4.2 5.0  CL 98* 98*  CO2 24 23  GLUCOSE 156* 103*  BUN 53* 36*  CREATININE 4.25* 3.43*  CALCIUM 8.6* 8.4*   No results for input(s): PTH in the last 72 hours. Iron Studies:   Recent Labs  06/12/15 0438  IRON 52  TIBC 302   Studies/Results: Dg Chest Port 1 View  06/13/2015  CLINICAL DATA:  Status post dialysis catheter placement EXAM: PORTABLE CHEST 1 VIEW COMPARISON:   03/29/2015 FINDINGS: Cardiac shadow is mildly enlarged. A dialysis catheter is now noted in satisfactory position. No pneumothorax is noted. The lungs are clear. IMPRESSION: No complicating factors following dialysis catheter placement. Electronically Signed   By: Alcide CleverMark  Lukens M.D.   On: 06/13/2015 12:51   Dg Fluoro Guide Cv Line-no Report  06/13/2015  CLINICAL DATA:  FLOURO GUIDE CV LINE Fluoroscopy was utilized by the requesting physician.  No radiographic interpretation.    Scheduled: . amiodarone  200 mg Oral Daily  . famotidine  20 mg Oral Daily  . feeding supplement (NEPRO CARB STEADY)  237 mL Oral BID BM  . ferric gluconate (FERRLECIT/NULECIT) IV  250 mg Intravenous Q T,Th,Sa-HD  . insulin aspart  0-9 Units Subcutaneous TID WC  . ipratropium-albuterol  3 mL Nebulization Once  . levothyroxine  50 mcg Oral QAC breakfast  . multivitamin with minerals  1 tablet Oral Daily     LOS: 6 days   [name] [name] D 06/14/2015,2:04 PM

## 2015-06-14 NOTE — Progress Notes (Addendum)
Subjective: RIJ temporary catheter exchanged for tunneled catheter yesterday in OR. He did not have arteriovenous fistula placed due to his thrombocytopenia. Had one dose of zofran for nausea around noon yesterday but no active vomiting. He and his wife are reviewing schedule and timing for arranging outpatient HD near his home.  Objective: Vital signs in last 24 hours: Filed Vitals:   06/14/15 0730 06/14/15 0800 06/14/15 0830 06/14/15 0930  BP: 107/62 116/73 117/64 127/70  Pulse: 47 66 65 69  Temp:      TempSrc:      Resp:      Height:      Weight:      SpO2:       Weight change: -1.1 kg (-2 lb 6.8 oz)  Intake/Output Summary (Last 24 hours) at 06/14/15 1023 Last data filed at 06/14/15 0600  Gross per 24 hour  Intake    930 ml  Output    485 ml  Net    445 ml   GENERAL- alert, co-operative, NAD HEENT- RIJ tunneled catheter, dressing clean/dry/intact CARDIAC- RRR, no murmurs, rubs or gallops RESP- CTAB, no wheezes or crackles EXTREMITIES- RLE BKA, no pedal edema. SKIN- Warm, dry PSYCH- Normal mood and affect  CBC:  Recent Labs  06/13/15 0700 06/14/15 0625  WBC 6.9 6.2  HGB 12.1* 11.7*  HCT 36.6* 35.6*  MCV 68.0* 68.9*  PLT 43* 48*   Medications: I have reviewed the patient's current medications. Scheduled Meds: . amiodarone  200 mg Oral Daily  . anticoagulant sodium citrate  5 mL Intravenous STAT  . famotidine  20 mg Oral Daily  . feeding supplement (NEPRO CARB STEADY)  237 mL Oral BID BM  . ferric gluconate (FERRLECIT/NULECIT) IV  250 mg Intravenous Q T,Th,Sa-HD  . insulin aspart  0-9 Units Subcutaneous TID WC  . ipratropium-albuterol  3 mL Nebulization Once  . levothyroxine  50 mcg Oral QAC breakfast  . multivitamin with minerals  1 tablet Oral Daily   Continuous Infusions:  PRN Meds:.sodium chloride, sodium chloride, alteplase, docusate sodium, fluticasone, lidocaine (PF), lidocaine-prilocaine, ondansetron,  pentafluoroprop-tetrafluoroeth Assessment/Plan: Symptomatic uremia 2/2 ESRD: 4th HD session today. Tunneled RIJ catheter placed yesterday. AVF delayed for thrombocytopenia. Placement to outpatient hemodialysis still underway, but promising he will be able to get on a stable QOD regimen. Ate 2 full meals yesterday. 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 Peripheral blood smear reviewed, only abnormal for few platelets and some 1+ polychromasia. No findings strongly suggestive of a dysplastic process. Patient is low risk by clinical criteria for HIT. Due to significant thrombocytopenia however, holding heparin products now and HIT panel pending. -Continue monitoring blood count  Atrial flutter: On eliquis PTA. Now that he is progressed to ESRD coumadin would be a more appropriate anticoagulation option. Based on decision to delay further procedures, we will plan to start coumadin anticoagulation.  Diet: Renal/Carb mod DVT Prophylaxis:  FULL CODE  Dispo: Disposition is deferred at this time, awaiting placement for outpatient hemodialysis.  The patient does have a current PCP Doneen Poisson, MD) and does need an [facility] hospital follow-up appointment after discharge.   LOS: 6 days   Fuller Plan, MD 06/14/2015, 10:23 AM  Addendum: After discussion with consulting services the plan will be for AVF surgery later after starting outpatient hemodialysis. He was previously on Eliquis but there is limited data for use in patients with ESRD on HD. We  will discuss anticoagulation options in the morning with [name] but optimal management would be to transition to coumadin for long term anticoagulation.  Fuller Planhristopher W Justice Aguirre, MD PGY-I Internal Medicine Resident Pager# [phone] 06/14/2015, 4:29 PM

## 2015-06-14 NOTE — Evaluation (Signed)
Physical Therapy Evaluation Patient Details Name: [name] [name] MRN: [id number] DOB: 08-28-1945 Today's Date: 06/14/2015   History of Present Illness  70 y.o. male admitted with Symptomatic uremia 2/2 ESRD,  s/p INSERTION OF DIALYSIS CATHETER, 3/22.  Clinical Impression  Patient is seen following the above procedure, presenting with functional limitations due to the deficits listed below (see PT Problem List). Demonstrates generalized weakness but is able to mobilize with assistance. Fatigued quickly, limiting ambulatory distance. OOB to chair today with wife present. They both feel that they will be able to safely manage at home following d/c. Will follow and progress. Patient will benefit from skilled PT to increase their independence and safety with mobility to allow discharge to the venue listed below.       Follow Up Recommendations Home health PT;Supervision/Assistance - 24 hour    Equipment Recommendations  None recommended by PT    Recommendations for Other Services OT consult     Precautions / Restrictions Precautions Precautions: Fall Precaution Comments: RLE prosthesis Restrictions Weight Bearing Restrictions: No      Mobility  Bed Mobility Overal bed mobility: Modified Independent             General bed mobility comments: extra time.  Transfers Overall transfer level: Needs assistance Equipment used: Rolling walker (2 wheeled) Transfers: Sit to/from Stand Sit to Stand: Min assist         General transfer comment: Min assist for boost to stand, slow to rise, VC for anterior weight-shift. Needs assist to fully donne prosthesis and pants while balancing with RW.  Ambulation/Gait Ambulation/Gait assistance: Min assist Ambulation Distance (Feet): 4 Feet Assistive device: Rolling walker (2 wheeled) Gait Pattern/deviations: Step-to pattern;Decreased stance time - right;Decreased stride length;Decreased dorsiflexion - right;Shuffle;Trunk flexed Gait  velocity: slow Gait velocity interpretation: <1.8 ft/sec, indicative of risk for recurrent falls General Gait Details: Assist for balance with short distance gait to recliner from bed. VC for sequencing and upright posture. Declines to ambulate further at this time due to fatigue.  Stairs            Wheelchair Mobility    Modified Rankin (Stroke Patients Only)       Balance Overall balance assessment: Needs assistance Sitting-balance support: No upper extremity supported;Feet supported Sitting balance-Leahy Scale: Fair     Standing balance support: Bilateral upper extremity supported Standing balance-Leahy Scale: Poor                               Pertinent Vitals/Pain Pain Assessment: No/denies pain    Home Living Family/patient expects to be discharged to:: Private residence Living Arrangements: Spouse/significant other Available Help at Discharge: Family;Available 24 hours/day Type of Home: House Home Access: Ramped entrance     Home Layout: Two level;Able to live on main level with bedroom/bathroom Home Equipment: Dan Humphreys - 2 wheels;Wheelchair - power;Bedside commode;Wheelchair - manual;Tub bench      Prior Function Level of Independence: Independent with assistive device(s)         Comments: States he can bath/dress self, ambulates house hold distances with RW. Rides power wheelchair outside.     Hand Dominance   Dominant Hand: Right    Extremity/Trunk Assessment   Upper Extremity Assessment: Defer to OT evaluation           Lower Extremity Assessment: RLE deficits/detail;Generalized weakness RLE Deficits / Details: Hx of Rt AKA       Communication   Communication: No  difficulties  Cognition Arousal/Alertness: Awake/alert Behavior During Therapy: WFL for tasks assessed/performed Overall Cognitive Status: Within Functional Limits for tasks assessed                      General Comments General comments (skin  integrity, edema, etc.): Wife present very supportive. They agree that they would like for pt to return home with assistance from wife as needed.    Exercises        Assessment/Plan    PT Assessment Patient needs continued PT services  PT Diagnosis Difficulty walking;Abnormality of gait;Generalized weakness   PT Problem List Decreased strength;Decreased activity tolerance;Decreased balance;Decreased mobility;Decreased knowledge of use of DME  PT Treatment Interventions DME instruction;Gait training;Functional mobility training;Therapeutic activities;Therapeutic exercise;Balance training;Neuromuscular re-education;Patient/family education   PT Goals (Current goals can be found in the Care Plan section) Acute Rehab PT Goals Patient Stated Goal: To go home PT Goal Formulation: With patient Time For Goal Achievement: 06/28/15 Potential to Achieve Goals: Good    Frequency Min 3X/week   Barriers to discharge        Co-evaluation               End of Session Equipment Utilized During Treatment: Gait belt Activity Tolerance: Patient limited by fatigue Patient left: in chair;with call bell/phone within reach;with chair alarm set;with family/visitor present Nurse Communication: Mobility status         Time: [id number] PT Time Calculation (min) (ACUTE ONLY): 22 min   Charges:   PT Evaluation $PT Eval Moderate Complexity: 1 Procedure     PT G CodesBerton Mount:        Carlus Stay S 06/14/2015, 1:45 PM  Charlsie MerlesLogan Secor Casilda Pickerill, [place]PT [phone]

## 2015-06-15 ENCOUNTER — Telehealth: Payer: Self-pay | Admitting: Internal Medicine

## 2015-06-15 LAB — GLUCOSE, CAPILLARY
GLUCOSE-CAPILLARY: 112 mg/dL — AB (ref 65–99)
GLUCOSE-CAPILLARY: 239 mg/dL — AB (ref 65–99)
GLUCOSE-CAPILLARY: 175 mg/dL — AB (ref 65–99)

## 2015-06-15 LAB — CBC
HCT: 32 % — ABNORMAL LOW (ref 39.0–52.0)
HCT: 33.7 % — ABNORMAL LOW (ref 39.0–52.0)
HEMOGLOBIN: 10.3 g/dL — AB (ref 13.0–17.0)
Hemoglobin: 11.2 g/dL — ABNORMAL LOW (ref 13.0–17.0)
MCH: 22.3 pg — ABNORMAL LOW (ref 26.0–34.0)
MCH: 23.5 pg — ABNORMAL LOW (ref 26.0–34.0)
MCHC: 32.2 g/dL (ref 30.0–36.0)
MCHC: 33.2 g/dL (ref 30.0–36.0)
MCV: 69.4 fL — ABNORMAL LOW (ref 78.0–100.0)
MCV: 70.8 fL — ABNORMAL LOW (ref 78.0–100.0)
PLATELETS: 16 10*3/uL — AB (ref 150–400)
PLATELETS: 21 10*3/uL — AB (ref 150–400)
RBC: 4.61 MIL/uL (ref 4.22–5.81)
RBC: 4.76 MIL/uL (ref 4.22–5.81)
RDW: 16 % — AB (ref 11.5–15.5)
RDW: 16.3 % — AB (ref 11.5–15.5)
WBC: 5.9 10*3/uL (ref 4.0–10.5)
WBC: 6.2 10*3/uL (ref 4.0–10.5)

## 2015-06-15 MED ORDER — WARFARIN - PHYSICIAN DOSING INPATIENT
Freq: Every day | Status: DC
  Administered 2015-06-15: 18:00:00

## 2015-06-15 MED ORDER — NEPRO/CARBSTEADY PO LIQD: 237.0000 mL | ORAL | Status: DC

## 2015-06-15 MED ORDER — WARFARIN SODIUM 2.5 MG PO TABS: 2.5000 mg | ORAL_TABLET | Freq: Once | ORAL | Status: DC

## 2015-06-15 MED ORDER — PATIENT'S GUIDE TO USING COUMADIN BOOK
Freq: Once | Status: AC
  Administered 2015-06-15: 16:00:00
  Filled 2015-06-15: qty 1

## 2015-06-15 MED ORDER — WARFARIN VIDEO: Freq: Once | Status: DC

## 2015-06-15 MED ORDER — WARFARIN SODIUM 2.5 MG PO TABS
2.5000 mg | ORAL_TABLET | Freq: Once | ORAL | Status: AC
  Administered 2015-06-15: 2.5 mg via ORAL
  Filled 2015-06-15: qty 1

## 2015-06-15 NOTE — Care Management Note (Signed)
Case Management Note  Patient Details  Name: [name] [name] MRN: [id number] Date of Birth: 1945/09/05  Subjective/Objective:         CM following for progression and d/c planning.           Action/Plan: 06/15/2015 Met with pt and wife re d/c plans and needs. Pt has walker and power chair at home no further DME needs. Pt and wife both declined HHPT services.    Expected Discharge Date:    06/15/2015              Expected Discharge Plan:  Home/Self Care  In-House Referral:  NA  Discharge planning Services  CM Consult  Post Acute Care Choice:  Home Health Choice offered to:  Patient  DME Arranged:   NA DME Agency:   NA  HH Arranged:   NA HH Agency:  NA  Status of Service:  Completed, signed off  Medicare Important Message Given:  Yes Date Medicare IM Given:    Medicare IM give by:    Date Additional Medicare IM Given:    Additional Medicare Important Message give by:     If discussed at [place] of Stay Meetings, dates discussed:    Additional Comments:  Adron Bene, RN 06/15/2015, 2:43 PM

## 2015-06-15 NOTE — Progress Notes (Signed)
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 Per primary svc.  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 Coreg dc'd for low BP / HR.    Plan: ok for dc today, will go to outpatient HD tomorrow.    Subjective: Interval History: no further   Objective: Vital signs in last 24 hours: Temp:  [98.8 F (37.1 C)-99.3 F (37.4 C)] 99.3 F (37.4 C) (03/24 0803) Pulse Rate:  [69-71] 71 (03/24 0803) Resp:  [20-21] 20 (03/24 0803) BP: (116-141)/(71-89) 141/81 mmHg (03/24 0803) SpO2:  [99 %-100 %] 99 % (03/24 0803) Weight change: 0.4 kg (14.1 oz)  Intake/Output from previous day: 03/23 0701 - 03/24 0700 In: 120 [IV Piggyback:120] Out: 100 [Urine:100] Intake/Output this shift: Total I/O In: 240 [P.O.:240] Out: 100 [Urine:100]  General appearance: alert and cooperative Resp: clear to auscultation bilaterally Chest wall: no tenderness GI: soft, non-tender; bowel sounds normal; no masses,  no organomegaly Extremities: extremities normal, atraumatic, no cyanosis or edema  Lab Results:  Recent Labs  06/14/15 0625 06/15/15 1048  WBC 6.2 5.9  HGB 11.7* 10.3*  HCT 35.6* 32.0*  PLT 48* 16*   BMET:   Recent Labs  06/13/15 0700  NA 134*  K 5.0  CL 98*  CO2 23  GLUCOSE 103*  BUN 36*  CREATININE 3.43*  CALCIUM 8.4*   No results for input(s): PTH in the last 72 hours. Iron Studies:  No results for input(s): IRON, TIBC, TRANSFERRIN, FERRITIN in the last 72 hours. Studies/Results: No results found.  Scheduled: . amiodarone  200 mg Oral Daily  . famotidine  20 mg Oral Daily  . feeding supplement (NEPRO CARB STEADY)  237 mL Oral BID BM  . ferric gluconate (FERRLECIT/NULECIT) IV  250 mg Intravenous Q T,Th,Sa-HD  . insulin aspart  0-9 Units Subcutaneous TID WC  . levothyroxine  50  mcg Oral QAC breakfast  . multivitamin  1 tablet Oral QHS  . [COMPLETED] patient's guide to using coumadin book   Does not apply Once  . warfarin   Does not apply Once  . Warfarin - Physician Dosing Inpatient   Does not apply q1800     LOS: 7 days   Charina Fons D 06/15/2015,3:01 PM

## 2015-06-15 NOTE — Progress Notes (Signed)
Physical Therapy Treatment Patient Details Name: Derrek GuWilliam R Galluzzo MRN: [id number] DOB: 04/17/1945 Today's Date: 06/15/2015    History of Present Illness 70 y.o. male admitted with Symptomatic uremia 2/2 ESRD,  s/p INSERTION OF DIALYSIS CATHETER, 3/22.    PT Comments    Pt feeling better and eager to go home.  Pt able to sit EOB Indep and apply R LE prothesis with just set up.  Spouse assisted by following with recliner as pt amb a greater distance.    Follow Up Recommendations  Home health PT     Equipment Recommendations  None recommended by PT    Recommendations for Other Services       Precautions / Restrictions Precautions Precautions: Fall Precaution Comments: RLE prosthesis/dialysis port Restrictions Weight Bearing Restrictions: No    Mobility  Bed Mobility Overal bed mobility: Modified Independent             General bed mobility comments: extra time.  Transfers Overall transfer level: Needs assistance Equipment used: Rolling walker (2 wheeled) Transfers: Sit to/from Stand Sit to Stand: Min assist;Min guard         General transfer comment: Min assist for boost to stand, slow to rise, VC for anterior weight-shift.   Ambulation/Gait Ambulation/Gait assistance: Min assist Ambulation Distance (Feet): 25 Feet Assistive device: Rolling walker (2 wheeled) Gait Pattern/deviations: Step-to pattern     General Gait Details: pt able to apply his R LE prothesis set up only.  Spouse assist by following with recliner.  Pt tolerated amb a greater distance.     Stairs            Wheelchair Mobility    Modified Rankin (Stroke Patients Only)       Balance                                    Cognition Arousal/Alertness: Awake/alert Behavior During Therapy: WFL for tasks assessed/performed Overall Cognitive Status: Within Functional Limits for tasks assessed                      Exercises      General Comments         Pertinent Vitals/Pain Pain Assessment: No/denies pain    Home Living                      Prior Function            PT Goals (current goals can now be found in the care plan section) Progress towards PT goals: Progressing toward goals    Frequency  Min 3X/week    PT Plan Current plan remains appropriate    Co-evaluation             End of Session Equipment Utilized During Treatment: Gait belt Activity Tolerance: Patient tolerated treatment well Patient left: in chair;with call bell/phone within reach;with chair alarm set;with family/visitor present     Time: 1113-1140 PT Time Calculation (min) (ACUTE ONLY): 27 min  Charges:  $Gait Training: 8-22 mins $Therapeutic Activity: 8-22 mins                    G Codes:      Felecia ShellingLori Azile Minardi  PTA [facility]  Acute  Rehab Pager      [phone]

## 2015-06-15 NOTE — Care Management Important Message (Signed)
Important Message  Patient Details  Name: [name] [name] MRN: [id number] Date of Birth: 11/24/1945   Medicare Important Message Given:  Yes    Oralia RudMegan P Jaloni Sorber 06/15/2015, 12:55 PM

## 2015-06-15 NOTE — Progress Notes (Signed)
   Subjective: Feeling well this morning without any acute complaints. He has a dialysis appointment scheduled outpatient. We discussed the risks and benefits of anticoagulation again today.   Objective: Vital signs in last 24 hours: Filed Vitals:   06/14/15 1045 06/14/15 2118 06/15/15 0500 06/15/15 0803  BP: 140/70 116/71 136/89 141/81  Pulse: 71 69 70 71  Temp: 98.6 F (37 C) 98.8 F (37.1 C) 99.1 F (37.3 C) 99.3 F (37.4 C)  TempSrc: Oral   Oral  Resp: 19 21 21 20   Height:      Weight:      SpO2: 100% 99% 100% 99%   Weight change: 0.4 kg (14.1 oz)  Intake/Output Summary (Last 24 hours) at 06/15/15 1122 Last data filed at 06/15/15 0800  Gross per 24 hour  Intake    120 ml  Output    200 ml  Net    -80 ml   GENERAL- alert, co-operative, NAD HEENT- RIJ tunneled catheter, dressing clean/dry/intact EXTREMITIES- RLE BKA, no pedal edema. SKIN- Warm, dry PSYCH- Normal mood and affect  CBC:  Recent Labs  06/13/15 0700 06/14/15 0625  WBC 6.9 6.2  HGB 12.1* 11.7*  HCT 36.6* 35.6*  MCV 68.0* 68.9*  PLT 43* 48*   Medications: I have reviewed the patient's current medications. Scheduled Meds: . amiodarone  200 mg Oral Daily  . famotidine  20 mg Oral Daily  . feeding supplement (NEPRO CARB STEADY)  237 mL Oral BID BM  . ferric gluconate (FERRLECIT/NULECIT) IV  250 mg Intravenous Q T,Th,Sa-HD  . insulin aspart  0-9 Units Subcutaneous TID WC  . levothyroxine  50 mcg Oral QAC breakfast  . multivitamin  1 tablet Oral QHS   Continuous Infusions:  PRN Meds:.docusate sodium, fluticasone, ondansetron Assessment/Plan: Symptomatic uremia 2/2 ESRD: Symptoms resolved, eating his full diet, ready to go home and follow up with outpatient hemodialysis. His wife seemed more interested in MWF recommended to discuss the schedule at center going forward. 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 Today's platelet count pending.  Low heparin induced platelet antibody assay of 0.54 and the clinical course indicate cause is not HIT. At this time we will plan for observation at HD sessions without other interventions if improvement continues.  Atrial flutter: On eliquis PTA. Now that he is progressed to ESRD coumadin would be a more appropriate anticoagulation option. Based on decision to delay further procedures, we will plan to start coumadin anticoagulation.  Diet: Renal/Carb mod DVT Prophylaxis: Coumadin FULL CODE  Dispo: Anticipated discharge to home later today. He will follow up at HD tomorrow. He will [facility] with coumadin clinic appointment Monday [facility]  The patient does have a current PCP Doneen Poisson(Lawrence Klima, MD).   LOS: 7 days   Fuller Planhristopher W Mihaela Fajardo, MD 06/15/2015, 11:22 AM

## 2015-06-15 NOTE — Discharge Instructions (Addendum)
1. After discharge it is important that you follow up with ALL dialysis appointments as instructed. If you are uncertain about your information please call the dialysis center in advance to ask.  2. Your medications were changed by DISCONTINUING ELIQUIS and starting warfarin. At first take 2.5mg  once daily until you follow up in clinic and follow new recommendations at that time.  3. We also stopped several of your blood pressure medicines since with dialysis you likely do not need them.  4. You will eventually need to have a permanent hemodialysis access placed in your arm once your blood work returns to a more normal level. This is likely to be in a few weeks.  5. If you notice any abnormal bleeding or bruising please call the clinic to discuss this as it may require evaluation with an appointment or at the ED.  Appointments: You have an appointment with the coumadin clinic at the [facility] downstairs at 11:30am Monday 3/24. You have an appointment with Dr. Dimple Casey at the [facility] downstairs at 2:15pm Friday 4/7.Information on my medicine - Coumadin   (Warfarin)  This medication education was reviewed with me or my healthcare representative as part of my discharge preparation.  The pharmacist that spoke with me during my hospital stay was:  Herby Abraham, [facility]  Why was Coumadin prescribed for you? Coumadin was prescribed for you because you have a blood clot or a medical condition that can cause an increased risk of forming blood clots. Blood clots can cause serious health problems by blocking the flow of blood to the heart, lung, or brain. Coumadin can prevent harmful blood clots from forming. As a reminder your indication for Coumadin is:   Stroke Prevention Because Of Atrial Fibrillation  What test will check on my response to Coumadin? While on Coumadin (warfarin) you will need to have an INR test regularly to ensure that your dose is keeping you in the desired range. The INR (international  normalized ratio) number is calculated from the result of the laboratory test called prothrombin time (PT).  If an INR APPOINTMENT HAS NOT ALREADY BEEN MADE FOR YOU please schedule an appointment to have this lab work done by your health care provider within 7 days. Your INR goal is usually a number between:  2 to 3 or your provider may give you a more narrow range like 2-2.5.  Ask your health care provider during an office visit what your goal INR is.  What  do you need to  know  About  COUMADIN? Take Coumadin (warfarin) exactly as prescribed by your healthcare provider about the same time each day.  DO NOT stop taking without talking to the doctor who prescribed the medication.  Stopping without other blood clot prevention medication to take the place of Coumadin may increase your risk of developing a new clot or stroke.  Get refills before you run out.  What do you do if you miss a dose? If you miss a dose, take it as soon as you remember on the same day then continue your regularly scheduled regimen the next day.  Do not take two doses of Coumadin at the same time.  Important Safety Information A possible side effect of Coumadin (Warfarin) is an increased risk of bleeding. You should call your healthcare provider right away if you experience any of the following: ? Bleeding from an injury or your nose that does not stop. ? Unusual colored urine (red or dark brown) or unusual colored stools (  red or black). ? Unusual bruising for unknown reasons. ? A serious fall or if you hit your head (even if there is no bleeding).  Some foods or medicines interact with Coumadin (warfarin) and might alter your response to warfarin. To help avoid this: ? Eat a balanced diet, maintaining a consistent amount of Vitamin K. ? Notify your provider about major diet changes you plan to make. ? Avoid alcohol or limit your intake to 1 drink for women and 2 drinks for men per day. (1 drink is 5 oz. wine, 12 oz.  beer, or 1.5 oz. liquor.)  Make sure that ANY health care provider who prescribes medication for you knows that you are taking Coumadin (warfarin).  Also make sure the healthcare provider who is monitoring your Coumadin knows when you have started a new medication including herbals and non-prescription products.  Coumadin (Warfarin)  Major Drug Interactions  Increased Warfarin Effect Decreased Warfarin Effect  Alcohol (large quantities) Antibiotics (esp. Septra/Bactrim, Flagyl, Cipro) Amiodarone (Cordarone) Aspirin (ASA) Cimetidine (Tagamet) Megestrol (Megace) NSAIDs (ibuprofen, naproxen, etc.) Piroxicam (Feldene) Propafenone (Rythmol SR) Propranolol (Inderal) Isoniazid (INH) Posaconazole (Noxafil) Barbiturates (Phenobarbital) Carbamazepine (Tegretol) Chlordiazepoxide (Librium) Cholestyramine (Questran) Griseofulvin Oral Contraceptives Rifampin Sucralfate (Carafate) Vitamin K   Coumadin (Warfarin) Major Herbal Interactions  Increased Warfarin Effect Decreased Warfarin Effect  Garlic Ginseng Ginkgo biloba Coenzyme Q10 Green tea St. Johns wort    Coumadin (Warfarin) FOOD Interactions  Eat a consistent number of servings per week of foods HIGH in Vitamin K (1 serving =  cup)  Collards (cooked, or boiled & drained) Kale (cooked, or boiled & drained) Mustard greens (cooked, or boiled & drained) Parsley *serving size only =  cup Spinach (cooked, or boiled & drained) Swiss chard (cooked, or boiled & drained) Turnip greens (cooked, or boiled & drained)  Eat a consistent number of servings per week of foods MEDIUM-HIGH in Vitamin K (1 serving = 1 cup)  Asparagus (cooked, or boiled & drained) Broccoli (cooked, boiled & drained, or raw & chopped) Brussel sprouts (cooked, or boiled & drained) *serving size only =  cup Lettuce, raw (green leaf, endive, romaine) Spinach, raw Turnip greens, raw & chopped   These websites have more information on Coumadin (warfarin):   http://www.king-russell.com/www.coumadin.com; https://www.hines.net/www.ahrq.gov/consumer/coumadin.htm;

## 2015-06-15 NOTE — Progress Notes (Signed)
Dr. Otis BraceMarjan Rabbani called and notified that the patient is not getting discharged because his platelet count is too low.

## 2015-06-15 NOTE — Telephone Encounter (Signed)
Needs TOC pt scheduled for 06/29/15 with dr rice

## 2015-06-15 NOTE — Progress Notes (Signed)
Critical lab called for platelets of 16, called TS, Dr. Otis BraceMarjan Rabbani ordered stat CBC and wanted to hold his discharge temporarily till the results come back.

## 2015-06-16 DIAGNOSIS — I5042 Chronic combined systolic (congestive) and diastolic (congestive) heart failure: Secondary | ICD-10-CM

## 2015-06-16 DIAGNOSIS — I132 Hypertensive heart and chronic kidney disease with heart failure and with stage 5 chronic kidney disease, or end stage renal disease: Principal | ICD-10-CM

## 2015-06-16 LAB — RENAL FUNCTION PANEL
Albumin: 2.2 g/dL — ABNORMAL LOW (ref 3.5–5.0)
Anion gap: 9 (ref 5–15)
BUN: 42 mg/dL — AB (ref 6–20)
CHLORIDE: 100 mmol/L — AB (ref 101–111)
CO2: 25 mmol/L (ref 22–32)
CREATININE: 3.95 mg/dL — AB (ref 0.61–1.24)
Calcium: 8.2 mg/dL — ABNORMAL LOW (ref 8.9–10.3)
GFR calc Af Amer: 16 mL/min — ABNORMAL LOW (ref >60)
GFR, EST NON AFRICAN AMERICAN: 14 mL/min — AB (ref >60)
Glucose, Bld: 104 mg/dL — ABNORMAL HIGH (ref 65–99)
Phosphorus: 2.7 mg/dL (ref 2.5–4.6)
Potassium: 3.9 mmol/L (ref 3.5–5.1)
Sodium: 134 mmol/L — ABNORMAL LOW (ref 135–145)

## 2015-06-16 LAB — GLUCOSE, CAPILLARY
Glucose-Capillary: 101 mg/dL — ABNORMAL HIGH (ref 65–99)
Glucose-Capillary: 138 mg/dL — ABNORMAL HIGH (ref 65–99)
Glucose-Capillary: 143 mg/dL — ABNORMAL HIGH (ref 65–99)

## 2015-06-16 LAB — CBC
HCT: 31.7 % — ABNORMAL LOW (ref 39.0–52.0)
HEMOGLOBIN: 10.3 g/dL — AB (ref 13.0–17.0)
MCH: 22.4 pg — ABNORMAL LOW (ref 26.0–34.0)
MCHC: 32.5 g/dL (ref 30.0–36.0)
MCV: 68.9 fL — AB (ref 78.0–100.0)
PLATELETS: 36 10*3/uL — AB (ref 150–400)
RBC: 4.6 MIL/uL (ref 4.22–5.81)
RDW: 15.9 % — ABNORMAL HIGH (ref 11.5–15.5)
WBC: 6.2 10*3/uL (ref 4.0–10.5)

## 2015-06-16 LAB — APTT: APTT: 38 s — AB (ref 24–37)

## 2015-06-16 LAB — TECHNOLOGIST SMEAR REVIEW

## 2015-06-16 LAB — PROTIME-INR
INR: 1.27 (ref 0.00–1.49)
Prothrombin Time: 16.1 seconds — ABNORMAL HIGH (ref 11.6–15.2)

## 2015-06-16 MED ORDER — ACETAMINOPHEN 325 MG PO TABS
ORAL_TABLET | ORAL | Status: AC
  Filled 2015-06-16: qty 2

## 2015-06-16 MED ORDER — PENTAFLUOROPROP-TETRAFLUOROETH EX AERO: 1.0000 "application " | INHALATION_SPRAY | CUTANEOUS | Status: DC | PRN

## 2015-06-16 MED ORDER — ANTICOAGULANT SODIUM CITRATE 4% (200MG/5ML) IV SOLN
5.0000 mL | Freq: Once | Status: AC
  Administered 2015-06-16: 5 mL via INTRAVENOUS
  Filled 2015-06-16: qty 250

## 2015-06-16 MED ORDER — ANTICOAGULANT SODIUM CITRATE 4% (200MG/5ML) IV SOLN
5.0000 mL | Status: DC | PRN
  Filled 2015-06-16: qty 250

## 2015-06-16 MED ORDER — LIDOCAINE HCL (PF) 1 % IJ SOLN: 5.0000 mL | INTRAMUSCULAR | Status: DC | PRN

## 2015-06-16 MED ORDER — SODIUM CHLORIDE 0.9 % IV SOLN: 100.0000 mL | INTRAVENOUS | Status: DC | PRN

## 2015-06-16 MED ORDER — LIDOCAINE-PRILOCAINE 2.5-2.5 % EX CREA: 1.0000 "application " | TOPICAL_CREAM | CUTANEOUS | Status: DC | PRN

## 2015-06-16 MED ORDER — ACETAMINOPHEN 325 MG PO TABS
650.0000 mg | ORAL_TABLET | Freq: Four times a day (QID) | ORAL | Status: DC | PRN
  Administered 2015-06-16: 650 mg via ORAL

## 2015-06-16 NOTE — Progress Notes (Signed)
Internal Medicine Attending:   I saw and examined the patient. I reviewed the resident's note and I agree with the resident's findings and plan as documented in the resident's note.  70 year old man with end-stage renal disease was admitted 3/17 for new start hemodialysis. We had planned for discharge yesterday, but the discharge was canceled because his a.m. CBC unexpectedly showed much worsened thrombocytopenia with platelet level of 16,000. Today the patient continues to feel well, eating and drinking well. Denies any chest pain, shortness of breath, bleeding, rashes, headache, vision changes, or other new symptoms. No fevers or chills. On exam his skin appears normal with no signs of peripheral thromboses.  Thrombocytopenia: He has chronic moderate thrombocytopenia, was admitted with platelet level of 79k. On review of his record I see that during a cholecystectomy in 2016, the surgeons noted his liver to appear small and nodular, and they encountered ascites in his abdomen. So I think he does have some level of liver disease due to hepatitis C that can explain his chronic mild thrombocytopenia. Platelets trended down to 43-48k and were stable for several days. Then quickly declined yesterday to 16k, now up to 38k today without any intervention. No signs of bleeding or thombosis. He was receiving heparin up to 3/22. Even with this new platelet nadir his HIT score still remains low probability. HIT panel was ordered by nephrology, heparin antibody is indeterminate level at 0.5 so a serotonin release assay is pending. He has no signs of hemolytic anemia so I still doubt HIT or TTP. No new medications added to explain this. 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 I still think this could be an acute reaction to the stress of initiating HD in a patient with liver dysfunction and chronic thrombocytopenia. I would like to keep the patient in house today to monitor for new  symptoms and repeat platelet count tomorrow.

## 2015-06-16 NOTE — Progress Notes (Signed)
Subjective: No complaints today. Wants to go home. Denies aches/pains, SOB, leg swelling, bleeding, or bruising. Slept well. Wife at bedside.   Platelets 36 this AM.   Objective: Vital signs in last 24 hours: Filed Vitals:   06/14/15 2118 06/15/15 0500 06/15/15 0803 06/15/15 1829  BP: 116/71 136/89 141/81 152/92  Pulse: 69 70 71 64  Temp: 98.8 F (37.1 C) 99.1 F (37.3 C) 99.3 F (37.4 C) 99.4 F (37.4 C)  TempSrc:   Oral Oral  Resp: 21 21 20 16   Height:      Weight:      SpO2: 99% 100% 99% 99%   Weight change:   Intake/Output Summary (Last 24 hours) at 06/16/15 0646 Last data filed at 06/15/15 1830  Gross per 24 hour  Intake    462 ml  Output    100 ml  Net    362 ml   Physical Exam: General: AA male, alert, cooperative, NAD. HEENT: PERRL, EOMI. Moist mucus membranes.  Neck: Full range of motion without pain, supple, no lymphadenopathy or carotid bruits. RIJ tunneled HD cath. Lungs: Clear to ascultation bilaterally, normal work of respiration, no wheezes, rales, rhonchi Heart: RRR, no murmurs, gallops, or rubs Abdomen: Soft, non-tender, non-distended, BS + Extremities: No cyanosis, clubbing, or edema. RLE BKA.  Neurologic: Alert & oriented X3, cranial nerves II-XII intact, strength grossly intact, sensation intact to light touch  CBC:  Recent Labs  06/15/15 1513 06/16/15 0629  WBC 6.2 6.2  HGB 11.2* 10.3*  HCT 33.7* 31.7*  MCV 70.8* 68.9*  PLT 21* 36*   Medications: I have reviewed the patient's current medications. Scheduled Meds: . amiodarone  200 mg Oral Daily  . famotidine  20 mg Oral Daily  . feeding supplement (NEPRO CARB STEADY)  237 mL Oral BID BM  . ferric gluconate (FERRLECIT/NULECIT) IV  250 mg Intravenous Q T,Th,Sa-HD  . insulin aspart  0-9 Units Subcutaneous TID WC  . levothyroxine  50 mcg Oral QAC breakfast  . multivitamin  1 tablet Oral QHS  . warfarin   Does not apply Once  . Warfarin - Physician Dosing Inpatient   Does not apply  q1800   Continuous Infusions:  PRN Meds:.docusate sodium, fluticasone, ondansetron   Assessment/Plan:  Acute on Chronic Thrombocytopenia: Improved to 36 today, from 21. No signs of bleeding/thrombosis. CBC as follows:   Recent Labs Lab 06/15/15 1048 06/15/15 1513 06/16/15 0629  HGB 10.3* 11.2* 10.3*  HCT 32.0* 33.7* 31.7*  WBC 5.9 6.2 6.2  PLT 16* 21* 36*  Low HIT assay of 0.54. Doubt HIT.  -Follow up serotonin release assay -Follow up CBC in outpatient clinic -[facility]  Uremia 2/2 ESRD: Resolved.  -For HD today -Continue Nulecit, Renavit  Atrial Flutter: Regular rate on exam this AM. On Eliquis prior to admission. This patients CHA2DS2-VASc Score and unadjusted Ischemic Stroke Rate (% per year) is equal to 7.2 % stroke rate/year from a score of 5. Plan to start Coumadin, although thrombocytopenia currently of concern.  -Continue Amiodarone 200 mg daily -Hold Coumadin for now -Follow up in outpatient clinic as above  CAD: Stable. No chest pain. On Imdur, Coreg previously.  -Continue to hold above medications until outpatient follow up  HTN: Mild HTN this AM. Anti-hypertensive medications held on admission.  -Continue to monitor -Consider adding back Norvasc   Chronic Combined CHF: Stable. Coreg, Imdur, Hydralazine held due to low BP, HR. -HD per renal -Consider resuming low dose  BB in outpatient clinic if HR can tolerate -Now ESRD, could use ACEI  DVT Prophylaxis: SCD's   FULL CODE  Dispo: Anticipated discharge today. He will follow up with coumadin clinic appointment Monday and at [facility] for PCP follow up.  The patient does have a current PCP Doneen Poisson, MD).   LOS: 8 days   Courtney Paris, MD 06/16/2015, 6:46 AM

## 2015-06-16 NOTE — Progress Notes (Signed)
[facility] Progress Note  Assessment/Plan: 1 ESRD/ new start to HD - Was due to be DC'd home yesterday however plt down to 16. OP HD center has been notified. HD orders sent. For HD today. Run even. Has OP HD set up on TTS schedule.  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 .542. INR 1.24. Per primary svc. Use citrate to flush HD catheter.  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 BS 101-239.  4 CM EF 25-30% 5 Hep C pos viral load 6 A flutter - eliquis to be changed to coumadin, will f/u in coumadin clinic per primary team notes, on amio. Coreg dc'd for low BP / HR.  Rate controlled.   Rita H. Brown NP-C 06/16/2015, 11:36 AM  [facility] [phone]  Pt seen, examined, agree w assess/plan as above with additions as indicated.  Vinson Moselleob Sharelle Burditt MD [facility] pager [phone]    cell [phone] 06/16/2015, 2:41 PM     Subjective: "I'm doing OK". Wife at bedside, discussed care at OP HD center.    Objective Filed Vitals:   06/15/15 0500 06/15/15 0803 06/15/15 1829 06/16/15 0904  BP: 136/89 141/81 152/92 142/72  Pulse: 70 71 64 66  Temp: 99.1 F (37.3 C) 99.3 F (37.4 C) 99.4 F (37.4 C) 98.7 F (37.1 C)  TempSrc:  Oral Oral Oral  Resp: 21 20 16 18   Height:      Weight:      SpO2: 100% 99% 99% 100%   Physical Exam General: Very pleasant, NAD Heart: S1, S2, NO R/M/G Lungs: Bilateral breath sounds CTA.  Abdomen: soft nontender with active BS Extremities: R BKA, no LE edema Dialysis Access: RIJ perm cath Drsg CDI.    Additional Objective Labs: Basic Metabolic Panel:  Recent Labs Lab 06/12/15 0859 06/13/15 0700 06/16/15 0629  NA 134* 134* 134*  K 4.2 5.0 3.9  CL 98* 98* 100*  CO2 24 23 25   GLUCOSE 156* 103* 104*  BUN 53* 36* 42*  CREATININE 4.25* 3.43* 3.95*  CALCIUM 8.6* 8.4* 8.2*  PHOS 3.4 2.7 2.7   Liver Function Tests:  Recent Labs Lab 06/12/15 0859 06/13/15 0700 06/16/15 0629  ALBUMIN  2.6* 2.5* 2.2*   No results for input(s): LIPASE, AMYLASE in the last 168 hours. CBC:  Recent Labs Lab 06/13/15 0700 06/14/15 0625 06/15/15 1048 06/15/15 1513 06/16/15 0629  WBC 6.9 6.2 5.9 6.2 6.2  HGB 12.1* 11.7* 10.3* 11.2* 10.3*  HCT 36.6* 35.6* 32.0* 33.7* 31.7*  MCV 68.0* 68.9* 69.4* 70.8* 68.9*  PLT 43* 48* 16* 21* 36*   Blood Culture    Component Value Date/Time   SDES URINE, RANDOM 12/10/2014 1324   SPECREQUEST NONE 12/10/2014 1324   CULT 50,000 COLONIES/mL PROTEUS MIRABILIS 12/10/2014 1324   REPTSTATUS 12/13/2014 FINAL 12/10/2014 1324    Cardiac Enzymes: No results for input(s): CKTOTAL, CKMB, CKMBINDEX, TROPONINI in the last 168 hours. CBG:  Recent Labs Lab 06/14/15 2119 06/15/15 0758 06/15/15 1207 06/15/15 1619 06/16/15 0810  GLUCAP 119* 112* 239* 175* 101*   Iron Studies: No results for input(s): IRON, TIBC, TRANSFERRIN, FERRITIN in the last 72 hours. @lablastinr3 @ Studies/Results: No results found. Medications:   . amiodarone  200 mg Oral Daily  . famotidine  20 mg Oral Daily  . feeding supplement (NEPRO CARB STEADY)  237 mL Oral BID BM  . ferric gluconate (FERRLECIT/NULECIT) IV  250 mg Intravenous Q T,Th,Sa-HD  . insulin aspart  0-9 Units Subcutaneous TID  WC  . levothyroxine  50 mcg Oral QAC breakfast  . multivitamin  1 tablet Oral QHS  . warfarin   Does not apply Once  . Warfarin - Physician Dosing Inpatient   Does not apply [phone]

## 2015-06-17 DIAGNOSIS — N19 Unspecified kidney failure: Secondary | ICD-10-CM

## 2015-06-17 DIAGNOSIS — B1920 Unspecified viral hepatitis C without hepatic coma: Secondary | ICD-10-CM

## 2015-06-17 DIAGNOSIS — E119 Type 2 diabetes mellitus without complications: Secondary | ICD-10-CM

## 2015-06-17 DIAGNOSIS — R5383 Other fatigue: Secondary | ICD-10-CM

## 2015-06-17 DIAGNOSIS — D693 Immune thrombocytopenic purpura: Secondary | ICD-10-CM

## 2015-06-17 DIAGNOSIS — I4891 Unspecified atrial fibrillation: Secondary | ICD-10-CM

## 2015-06-17 LAB — BASIC METABOLIC PANEL
ANION GAP: 6 (ref 5–15)
BUN: 24 mg/dL — ABNORMAL HIGH (ref 6–20)
CHLORIDE: 101 mmol/L (ref 101–111)
CO2: 28 mmol/L (ref 22–32)
Calcium: 7.8 mg/dL — ABNORMAL LOW (ref 8.9–10.3)
Creatinine, Ser: 2.55 mg/dL — ABNORMAL HIGH (ref 0.61–1.24)
GFR calc Af Amer: 27 mL/min — ABNORMAL LOW (ref >60)
GFR, EST NON AFRICAN AMERICAN: 24 mL/min — AB (ref >60)
GLUCOSE: 141 mg/dL — AB (ref 65–99)
POTASSIUM: 4 mmol/L (ref 3.5–5.1)
Sodium: 135 mmol/L (ref 135–145)

## 2015-06-17 LAB — CBC
HEMATOCRIT: 28.8 % — AB (ref 39.0–52.0)
HEMOGLOBIN: 9.6 g/dL — AB (ref 13.0–17.0)
MCH: 23.4 pg — AB (ref 26.0–34.0)
MCHC: 33.3 g/dL (ref 30.0–36.0)
MCV: 70.1 fL — AB (ref 78.0–100.0)
Platelets: 26 10*3/uL — CL (ref 150–400)
RBC: 4.11 MIL/uL — ABNORMAL LOW (ref 4.22–5.81)
RDW: 16 % — ABNORMAL HIGH (ref 11.5–15.5)
WBC: 5.1 10*3/uL (ref 4.0–10.5)

## 2015-06-17 LAB — GLUCOSE, CAPILLARY
GLUCOSE-CAPILLARY: 164 mg/dL — AB (ref 65–99)
Glucose-Capillary: 127 mg/dL — ABNORMAL HIGH (ref 65–99)
Glucose-Capillary: 125 mg/dL — ABNORMAL HIGH (ref 65–99)
Glucose-Capillary: 110 mg/dL — ABNORMAL HIGH (ref 65–99)

## 2015-06-17 NOTE — Progress Notes (Signed)
Internal Medicine Attending:   I saw and examined the patient. I reviewed the resident's note and I agree with the resident's findings and plan as documented in the resident's note.  70 year old man with end-stage renal disease admitted for new start hemodialysis. He's done great from a renal replacement standpoint and was ready to discharge to home on Friday, however thrombocytopenia has become more severe with a nadir at 16,000 and now at 26,000 today. He still has no clinical signs of bleeding or thromboses. No signs of systemic infection. No classically offending medications to explain the thrombocytopenia. He consistently has a low HIT score and serotonin release assay is pending. 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 This could be primary, or secondary to hepatitis C virus infection. Plan is to consult hematology today to discuss utility of a therapeutic trial with dexamethasone.

## 2015-06-17 NOTE — Progress Notes (Signed)
   Subjective: No complaints today.   Objective: Vital signs in last 24 hours: Filed Vitals:   06/16/15 2000 06/16/15 2105 06/16/15 2140 06/17/15 0430  BP: 137/74 128/79 140/85 130/68  Pulse: 57 64 76 75  Temp:   99.5 F (37.5 C) 98.7 F (37.1 C)  TempSrc:   Oral Oral  Resp: 18 18 18 16   Height:      Weight:  150 lb 9.2 oz (68.3 kg) 150 lb 12.7 oz (68.4 kg)   SpO2:   98% 99%   Weight change:   Intake/Output Summary (Last 24 hours) at 06/17/15 1037 Last data filed at 06/17/15 0200  Gross per 24 hour  Intake    600 ml  Output    650 ml  Net    -50 ml   Physical Exam: General: AA male, alert, cooperative, NAD. HEENT:Moist mucus membranes, no signs of mucosal bleeding Neck:  RIJ tunneled HD cath w/ dry dressing Lungs: Clear to anterior ascultation bilaterally, normal work of respiration, no wheezes, rales, rhonchi Heart: RRR, no murmurs, gallops, or rubs Abdomen: Soft, non-tender, non-distended, BS + Extremities: No cyanosis, clubbing, or edema. RLE BKA.  Neurologic:  cranial nerves II-XII intact  CBC:  Recent Labs  06/16/15 0629 06/17/15 0431  WBC 6.2 5.1  HGB 10.3* 9.6*  HCT 31.7* 28.8*  MCV 68.9* 70.1*  PLT 36* 26*   Medications: I have reviewed the patient's current medications. Scheduled Meds: . amiodarone  200 mg Oral Daily  . famotidine  20 mg Oral Daily  . feeding supplement (NEPRO CARB STEADY)  237 mL Oral BID BM  . ferric gluconate (FERRLECIT/NULECIT) IV  250 mg Intravenous Q T,Th,Sa-HD  . insulin aspart  0-9 Units Subcutaneous TID WC  . levothyroxine  50 mcg Oral QAC breakfast  . multivitamin  1 tablet Oral QHS  . warfarin   Does not apply Once  . Warfarin - Physician Dosing Inpatient   Does not apply q1800   Continuous Infusions:  PRN Meds:.acetaminophen, docusate sodium, fluticasone, ondansetron   Assessment/Plan:  Acute on Chronic Thrombocytopenia:. No signs of bleeding/thrombosis.  - CBC as follows:   Recent Labs Lab 06/15/15 1513  06/16/15 0629 06/17/15 0431  HGB 11.2* 10.3* 9.6*  HCT 33.7* 31.7* 28.8*  WBC 6.2 6.2 5.1  PLT 21* 36* 26*  - Low HIT assay of 0.54. Doubt HIT.  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 Has RIJ HD cath currently. - nephro following -Continue Nulecit, Renavit  Atrial Flutter:   -continue Amiodarone 200 mg daily - coumadin per pharm -Follow up in outpatient clinic as above  CAD: -Continue to hold above medications until outpatient follow up  HTN: BP 130/68 this am.  - holding home meds.   Chronic Combined CHF: euvolemic  -Consider resuming low dose BB in outpatient clinic if HR can tolerate -Now ESRD, could use ACEI  DVT Prophylaxis: SCD's   FULL CODE  Dispo: dispo deferred until further workup of thrombocytopenia  The patient does have a current PCP Doneen Poisson(Lawrence Klima, MD).   LOS: 9 days   Denton Brickiana M Najib Colmenares, MD 06/17/2015, 10:37 AM

## 2015-06-17 NOTE — Progress Notes (Signed)
Pt came from dialysis  continued to complain of pain 8/10 , saying that the tylenol that was given to him an hour ago was not very helpful.Attending nurse notified  On-call MD who ordered to give pt tylenol 500mg  .However when RN went to give pt the tylenol pt says he is "ok now".Will continue to monitor

## 2015-06-17 NOTE — Consult Note (Addendum)
Referral MD  Reason for Referral: Thrombocytopenia in the setting of renal failure, hepatitis C and diabetes   No chief complaint on file. : No chief complaint noted  HPI: [name] is a 70 year old African-American male. He has long-standing diabetes. He was on insulin at one point. He is now off insulin.  He had a very tough year last year. He fell and broke his left hip. This was back in March. He had a left hip repair.  He has atrial fibrillation/flutter. He had been on ELIQUIS area and now is on Coumadin.  He was admitted because of weakness. He had worsening renal function. He now is on dialysis. He was admitted on March 17. When he was admitted, his platelet count was 79,000. Hemoglobin 12.3 and white cell count 6.6. In January of this year, his platelet count was 148,000. Last year, seem like his plate count is holding pretty steady.  His renal function has deteriorated. He now is on dialysis. About 2 months ago, his creatinine was 4.84.  He had dialysis catheter placed. He is supposed to have a AV fistula placed.  His platelet count has been trending downward. He was getting some heparin he was checked for HITT, which was negative.  Again, he's had no bleeding. He's had no bruising. He looked pretty good this morning. His wife was with him. He's been eating without nausea or vomiting.  He's had no swollen glands. He's had no fever.  There has been no diarrhea. He's had no blood per rectum.  He found out last year that he had hepatitis C. He is not sure how he contracted this.   He currently is on quite a few medications.  He does seem very nice.  Overall, his performance status is ECOG 2.          Past Medical History  Diagnosis Date  . Hyperlipidemia 04/02/2006  . Essential hypertension 12/09/2006  . Benign prostatic hypertrophy with nocturia 07/07/2006  . Chronic venous insufficiency 04/12/2010  . Obesity (BMI 30.0-34.9) 01/15/2012  . Constipation 05/25/2009     Intermittent   . Microcytic normochromic anemia 05/27/2006  . Internal and external hemorrhoids without complication 08/20/2012  . Coronary artery disease 04/02/2006    s/p RCA DES 2006, low risk myoview in 2011  . 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 Type 2 diabetes mellitus with peripheral artery disease (HCC) 05/27/2006    Absent pulses in the left foot   . Closed displaced fracture of left femoral neck (HCC) 06/14/2014    s/p left hip hemiarthroplasty June 14, 2014   . Atrial flutter (HCC) 07/26/2014    a. May 2016, CHA2DS2VASc = 4 -> Eliquis, spontaneous conversion to NSR;  b. On amio;  c. 01/2015 EPS: Unable to induce right sided Aflutter. Non-sustained Afib and Left sided Aflutter noted.  [name]. GERD (gastroesophageal reflux disease)   . Type 2 diabetes mellitus with stage 4 chronic kidney disease (HCC) 08/06/2012  . History of blood transfusion 05/2014    "related to hip OR"  . Degenerative joint disease involving multiple joints 02/02/2007  . Arthritis     "fingers" (02/07/2015)  . Nausea and vomiting 06/01/2015  :  Past Surgical History  Procedure Laterality Date  . Pilonidal cyst excision  1990's  . Refractive surgery Bilateral   . Fracture surgery    . Total hip arthroplasty Left 06/14/2014    Procedure: HEMI HIP ARTHROPLASTY ANTERIOR APPROACH;  Surgeon: Tarry Kos, MD;  Location: [facility] OR;  Service: Orthopedics;  Laterality: Left;  . Joint replacement    . Leg amputation above knee Right ~ 2008  . Cholecystectomy N/A 12/12/2014    Procedure: LAPAROSCOPIC CHOLECYSTECTOMY WITH INTRAOPERATIVE CHOLANGIOGRAM;  Surgeon: Gaynelle Adu, MD;  Location: [facility] OR;  Service: General;  Laterality: N/A;  . Electrophysiologic study N/A 02/07/2015    Procedure:  A-Flutter Ablation;  Surgeon: Marinus Maw, MD;  Location: [facility] INVASIVE CV LAB;  Service: Cardiovascular;  Laterality: N/A;  . Tonsillectomy    . Coronary angioplasty with stent placement    . Prostate biopsy  ~ 2013  . Insertion of dialysis catheter Right 06/13/2015    Procedure: INSERTION OF DIALYSIS CATHETER RIGHT INTERNAL JUGULAR;  Surgeon: Larina Earthly, MD;  Location: [facility] OR;  Service: Vascular;  Laterality: Right;  . Removal of a dialysis catheter Right 06/13/2015    Procedure: REMOVAL OF A DIALYSIS CATHETER;  Surgeon: Larina Earthly, MD;  Location: [facility] OR;  Service: Vascular;  Laterality: Right;  :   Current facility-administered medications:  .  acetaminophen (TYLENOL) tablet 650 mg, 650 mg, Oral, Q6H PRN, Courtney Paris, MD, 650 mg at 06/16/15 2034 .  amiodarone (PACERONE) tablet 200 mg, 200 mg, Oral, Daily, Denton Brick, MD, 200 mg at 06/17/15 1100 .  docusate sodium (COLACE) capsule 100 mg, 100 mg, Oral, Daily PRN, Fuller Plan, MD, 100 mg at 06/11/15 1701 .  famotidine (PEPCID) tablet 20 mg, 20 mg, Oral, Daily, Denton Brick, MD, 20 mg at 06/17/15 1100 .  feeding supplement (NEPRO CARB STEADY) liquid 237 mL, 237 mL, Oral, BID BM, Tyson Alias, MD, 237 mL at 06/15/15 1400 .  ferric gluconate (NULECIT) 250 mg in sodium chloride 0.9 % 100 mL IVPB, 250 mg, Intravenous, Q T,Th,Sa-HD, Delano Metz, MD, 250 mg at 06/16/15 2013 .  fluticasone (FLONASE) 50 MCG/ACT nasal spray 1 spray, 1 spray, Each Nare, Daily PRN, Denton Brick, MD .  insulin aspart (novoLOG) injection 0-9 Units, 0-9 Units, Subcutaneous, TID WC, Denton Brick, MD, 1 Units at 06/17/15 1230 .  levothyroxine (SYNTHROID, LEVOTHROID) tablet 50 mcg, 50 mcg, Oral, QAC breakfast, Fuller Plan, MD, 50 mcg at 06/17/15 0730 .  multivitamin (RENA-VIT) tablet 1 tablet, 1 tablet, Oral, QHS, Scarlett Presto, RPH, 1 tablet at 06/16/15 2300 .  ondansetron (ZOFRAN-ODT) disintegrating tablet 4 mg, 4 mg, Oral, Q8H PRN,  Fuller Plan, MD .  warfarin (COUMADIN) video, , Does not apply, Once, Herby Abraham, RPH .  Warfarin - Physician Dosing Inpatient, , Does not apply, q1800, Herby Abraham, RPH:  . amiodarone  200 mg Oral Daily  . famotidine  20 mg Oral Daily  . feeding supplement (NEPRO CARB STEADY)  237 mL Oral BID BM  . ferric gluconate (FERRLECIT/NULECIT) IV  250 mg Intravenous Q T,Th,Sa-HD  . insulin aspart  0-9 Units Subcutaneous TID WC  . levothyroxine  50 mcg Oral QAC breakfast  . multivitamin  1 tablet Oral QHS  . warfarin   Does not apply Once  . Warfarin - Physician Dosing Inpatient   Does not apply q1800  :  Allergies  Allergen Reactions  . Amoxicillin Swelling and Other (See Comments)    Puffy eyes and abdominal pain with Amoxicillin  PO  . Lisinopril Other (See Comments)    Acute kidney injury  . Pravastatin Other (See Comments)    Weakness and fatigue  . Tamsulosin Other (See Comments)    REACTION: dry throat, sweating, blurred vision  . Doxycycline Rash and Other (See Comments)    Questionable drug rxn rash  :  Family History  Problem Relation Age of Onset  . Hypertension Mother   . Heart attack Father   . Breast cancer Sister   . Arthritis Sister   . Heart attack Brother   . Diabetes Brother   . Stroke Brother   . Pneumonia Daughter   . Diabetes Brother   . Alcoholism Brother   . Diabetes Brother   . Arthritis Sister     Bilateral knee replacement  . HIV Daughter   . Hypertension Daughter   . Drug abuse Daughter   . Schizophrenia Daughter   . Hypertension Daughter   . Drug abuse Daughter   . Bipolar disorder Daughter   :  Social History   Social History  . Marital Status: Married    Spouse Name: N/A  . Number of Children: N/A  . Years of Education: N/A   Occupational History  . Not on file.   Social History Main Topics  . Smoking status: Former Smoker -- 1.00 packs/day for 10 years    Types: Cigarettes    Quit date: 06/16/1968  . Smokeless  tobacco: Never Used  . Alcohol Use: No     Comment: "quit alcohol in the 1960's"  . Drug Use: No  . Sexual Activity: Not Currently    Birth Control/ Protection: None   Other Topics Concern  . Not on file   Social History Narrative  :  Pertinent items are noted in HPI.  Exam: Patient Vitals for the past 24 hrs:  BP Temp Temp src Pulse Resp SpO2 Weight  06/17/15 1000 140/70 mmHg 98.6 F (37 C) Oral 82 18 98 % -  06/17/15 0430 130/68 mmHg 98.7 F (37.1 C) Oral 75 16 99 % -  06/16/15 2140 140/85 mmHg 99.5 F (37.5 C) Oral 76 18 98 % 150 lb 12.7 oz (68.4 kg)  06/16/15 2105 128/79 mmHg - - 64 18 - 150 lb 9.2 oz (68.3 kg)  06/16/15 2000 137/74 mmHg - - (!) 57 18 - -  06/16/15 1930 (!) 116/55 mmHg - - (!) 47 - - -  06/16/15 1900 119/76 mmHg - - 71 - - -  06/16/15 1830 125/72 mmHg - - (!) 55 - - -  06/16/15 1800 107/62 mmHg - - 69 - - -  06/16/15 1735 (!) 142/91 mmHg - - (!) 57 - - -  06/16/15 1731 138/76 mmHg 97.7 F (36.5 C) Oral 61 - 100 % 148 lb 13 oz (67.5 kg)    as above    Recent Labs  06/16/15 0629 06/17/15 0431  WBC 6.2 5.1  HGB 10.3* 9.6*  HCT 31.7* 28.8*  PLT 36* 26*    Recent Labs  06/16/15 0629 06/17/15 0431  NA 134* 135  K 3.9 4.0  CL 100* 101  CO2 25 28  GLUCOSE 104* 141*  BUN 42* 24*  CREATININE 3.95* 2.55*  CALCIUM 8.2* 7.8*    Blood smear review: Mild anisocytosis and poikilocytosis. There are no nucleated red cells. He has no rouleau formation. There is no teardrop cells. I see no inclusion bodies. There are no schistocytes or spherocytes. I see no hypersegmented polys. His good  maturation was white blood cells. No atypical lymphocytes. Platelets are decreased in number. Platelets are well granulated. He has several large platelets. I see no platelet clumps.  Pathology: None     Assessment and Plan:  Mr. Fidalgo is a 70 year old African-American male. He has hepatitis C. He has renal failure. He is on dialysis. He has diabetes.  His  blood smear seems to suggest at this thrombocytopenia might be a destruction or sequestration problem. He has several large platelets that typically is a sign of an active bone marrow making platelets. As such I think his risk of bleeding probably will not be that high.  As the hepatitis C, I don't see any other obvious culprit causing the thrombocytopenia. 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  It's unusual for amiodarone to cause thrombocytopenia.  As much as I hate to say this, but a bone marrow test is necessary. I think we have to do this to make sure that we are not overlooking anything that is not obvious. I would think that his bone marrow will have a increased number of megakaryocytes.   At least from his blood smear, the large platelets are typically very functional so I think his risk of bleeding will be minimal.  We will follow along. Again I think his risk of bleeding will be quite low. Typically, platelets inpatients with immune-based thrombocytopenia are very "sticky".  I really cannot think of any medicine to put him on right now. We do have medicines for immune-based thrombocytopenia. Unfortunately, this has to be been done in our office.  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 I think if this gets treated, then his platelet count will normalize.  I would not put him on anything right yet. In the very hard to put him on steroids given his diabetes.  I will have radiology do the bone marrow test for [place]. They may not be able do it for a day or so.  I cannot think of any other blood tests that we can do right now.  Thanks for letting me see him!! He is very nice!!!  Lawerance Sabal 1:5

## 2015-06-18 ENCOUNTER — Ambulatory Visit: Payer: Medicare Other

## 2015-06-18 LAB — CBC
HCT: 31.3 % — ABNORMAL LOW (ref 39.0–52.0)
HEMATOCRIT: 31.6 % — AB (ref 39.0–52.0)
Hemoglobin: 10.4 g/dL — ABNORMAL LOW (ref 13.0–17.0)
Hemoglobin: 10 g/dL — ABNORMAL LOW (ref 13.0–17.0)
MCH: 23 pg — ABNORMAL LOW (ref 26.0–34.0)
MCH: 22.1 pg — ABNORMAL LOW (ref 26.0–34.0)
MCHC: 32.9 g/dL (ref 30.0–36.0)
MCHC: 31.9 g/dL (ref 30.0–36.0)
MCV: 69.9 fL — AB (ref 78.0–100.0)
MCV: 69.2 fL — ABNORMAL LOW (ref 78.0–100.0)
PLATELETS: 50 10*3/uL — AB (ref 150–400)
Platelets: 54 10*3/uL — ABNORMAL LOW (ref 150–400)
RBC: 4.52 MIL/uL (ref 4.22–5.81)
RBC: 4.52 MIL/uL (ref 4.22–5.81)
RDW: 16.1 % — AB (ref 11.5–15.5)
RDW: 16 % — ABNORMAL HIGH (ref 11.5–15.5)
WBC: 6.9 10*3/uL (ref 4.0–10.5)
WBC: 6.6 10*3/uL (ref 4.0–10.5)

## 2015-06-18 LAB — RENAL FUNCTION PANEL
Albumin: 2.2 g/dL — ABNORMAL LOW (ref 3.5–5.0)
Anion gap: 10 (ref 5–15)
BUN: 40 mg/dL — ABNORMAL HIGH (ref 6–20)
CO2: 22 mmol/L (ref 22–32)
Calcium: 8.1 mg/dL — ABNORMAL LOW (ref 8.9–10.3)
Chloride: 101 mmol/L (ref 101–111)
Creatinine, Ser: 3.08 mg/dL — ABNORMAL HIGH (ref 0.61–1.24)
GFR calc Af Amer: 22 mL/min — ABNORMAL LOW (ref >60)
GFR calc non Af Amer: 19 mL/min — ABNORMAL LOW (ref >60)
Glucose, Bld: 163 mg/dL — ABNORMAL HIGH (ref 65–99)
Phosphorus: 3.1 mg/dL (ref 2.5–4.6)
Potassium: 4 mmol/L (ref 3.5–5.1)
Sodium: 133 mmol/L — ABNORMAL LOW (ref 135–145)

## 2015-06-18 LAB — GLUCOSE, CAPILLARY
GLUCOSE-CAPILLARY: 127 mg/dL — AB (ref 65–99)
Glucose-Capillary: 165 mg/dL — ABNORMAL HIGH (ref 65–99)
Glucose-Capillary: 175 mg/dL — ABNORMAL HIGH (ref 65–99)
Glucose-Capillary: 128 mg/dL — ABNORMAL HIGH (ref 65–99)

## 2015-06-18 LAB — PROTIME-INR
INR: 1.26 (ref 0.00–1.49)
Prothrombin Time: 16 seconds — ABNORMAL HIGH (ref 11.6–15.2)

## 2015-06-18 MED ORDER — NEPRO/CARBSTEADY PO LIQD: 237.0000 mL | Freq: Every day | ORAL | Status: DC

## 2015-06-18 NOTE — Care Management Important Message (Signed)
Important Message  Patient Details  Name: [name] [name] MRN: [id number] Date of Birth: 08/26/1945   Medicare Important Message Given:  Yes    Bernadette HoitShoffner, Dakwon Wenberg Coleman 06/18/2015, 1:07 PM

## 2015-06-18 NOTE — Progress Notes (Signed)
Nutrition Follow-up  DOCUMENTATION CODES:   Severe malnutrition in context of acute illness/injury  INTERVENTION:  Provide Nepro Shake po once daily, each supplement provides 425 kcal and 19 grams protein.  Encourage adequate PO intake.   NUTRITION DIAGNOSIS:   Malnutrition related to  (illness) as evidenced by moderate depletions of muscle mass, moderate depletion of body fat; ongoing  GOAL:   Patient will meet greater than or equal to 90% of their needs; met  MONITOR:   PO intake, Supplement acceptance, Weight trends, Labs, I & O's  REASON FOR ASSESSMENT:   Malnutrition Screening Tool    ASSESSMENT:   70yo with stage 4 CKD, CHF (EF 50% May 2016) Atrial flutter, HTN, T2DM previously on insulin s/p R BKA, CAD (RCA DES in 2006), HCV (untreated, normal RUQ [place]) who presents with nausea and vomiting as well as weakness and generalized malaise, particularly worse over the past 2 weeks but gradually worsening over the past 6 months. 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 Pt currently has Nepro Shake ordered however has bene refusing most of them. RD to modify orders to only provide it once daily. Plans for bone marrow biopsy tomorrow.   Labs and medications reviewed.   Diet Order:  Diet renal/carb modified with fluid restriction Diet-HS Snack?: Nothing; Room service appropriate?: Yes; Fluid consistency:: Thin Diet NPO time specified Except for: Sips with Meds  Skin:   (Incision on chest)  Last BM:  3/26  Height:   Ht Readings from Last 1 Encounters:  06/11/15 _0  (1.727 m)    Weight:   Wt Readings from Last 1 Encounters:  06/17/15 150 lb 5.7 oz (68.2 kg)    Ideal Body Weight:  64.4 kg (adjusted for R AKA)  BMI:  Body mass index is 22.87 kg/(m^2).  Estimated Nutritional Needs:   Kcal:  0141-5973  Protein:  85-100 grams  Fluid:  1.2 L/day  EDUCATION NEEDS:   No education needs identified at this  time  Corrin Parker, MS, RD, LDN Pager # [phone] After hours/ weekend pager # [phone]

## 2015-06-18 NOTE — Consult Note (Signed)
Chief Complaint: Patient was seen in consultation today for bone marrow biopsy at the request of Dr Marin Olp  Referring Physician(s): [facility] Dr Marin Olp  Supervising Physician: Aletta Edouard  History of Present Illness: [name] [name] is a 70 y.o. male   ESRD---new Weakness  Thrombocytopenia HITT negative Hep C IDDM On coumadin for Afib---not on med list INR 1.26 3/27  Request for bone marrow biopsy Scheduled for 3/28 in IR  Past Medical History  Diagnosis Date  . Hyperlipidemia 04/02/2006  . Essential hypertension 12/09/2006  . Benign prostatic hypertrophy with nocturia 07/07/2006  . Chronic venous insufficiency 04/12/2010  . Obesity (BMI 30.0-34.9) 01/15/2012  . Constipation 05/25/2009    Intermittent   . Microcytic normochromic anemia 05/27/2006  . Internal and external hemorrhoids without complication 6/37/8588  . Coronary artery disease 04/02/2006    s/p RCA DES 2006, low risk myoview in 2011  . Type 2 diabetes mellitus with neurological manifestations ([place]) 04/02/2006    Neuropathy of the left foot   . 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 Type 2 diabetes mellitus with peripheral artery disease ([place]) 05/27/2006    Absent pulses in the left foot   . Closed displaced fracture of left femoral neck ([place]) 06/14/2014    s/p left hip hemiarthroplasty June 14, 2014   . Atrial flutter ([place]) 07/26/2014    a. May 2016, CHA2DS2VASc = 4 -> Eliquis, spontaneous conversion to NSR;  b. On amio;  c. 01/2015 EPS: Unable to induce right sided Aflutter. Non-sustained Afib and Left sided Aflutter noted.  [name] GERD (gastroesophageal reflux disease)   . Type 2 diabetes mellitus with stage 4 chronic kidney disease ([place]) 08/06/2012  . History of blood transfusion 05/2014    "related to hip OR"  . Degenerative joint disease involving multiple joints 02/02/2007  .  Arthritis     "fingers" (02/07/2015)  . Nausea and vomiting 06/01/2015    Past Surgical History  Procedure Laterality Date  . Pilonidal cyst excision  1990's  . Refractive surgery Bilateral   . Fracture surgery    . Total hip arthroplasty Left 06/14/2014    Procedure: HEMI HIP ARTHROPLASTY ANTERIOR APPROACH;  Surgeon: Leandrew Koyanagi, MD;  Location: [facility];  Service: Orthopedics;  Laterality: Left;  . Joint replacement    . Leg amputation above knee Right ~ 2008  . Cholecystectomy N/A 12/12/2014    Procedure: LAPAROSCOPIC CHOLECYSTECTOMY WITH INTRAOPERATIVE CHOLANGIOGRAM;  Surgeon: Greer Pickerel, MD;  Location: [facility];  Service: General;  Laterality: N/A;  . Electrophysiologic study N/A 02/07/2015    Procedure: A-Flutter Ablation;  Surgeon: Evans Lance, MD;  Location: [facility];  Service: Cardiovascular;  Laterality: N/A;  . Tonsillectomy    . Coronary angioplasty with stent placement    . Prostate biopsy  ~ 2013  . Insertion of dialysis catheter Right 06/13/2015    Procedure: INSERTION OF DIALYSIS CATHETER RIGHT INTERNAL JUGULAR;  Surgeon: Rosetta Posner, MD;  Location: [facility];  Service: Vascular;  Laterality: Right;  . Removal of a dialysis catheter Right 06/13/2015    Procedure: REMOVAL OF A DIALYSIS CATHETER;  Surgeon: Rosetta Posner, MD;  Location: [facility];  Service: Vascular;  Laterality: Right;    Allergies: Amoxicillin; Lisinopril; Pravastatin; Tamsulosin; and Doxycycline  Medications: Prior to Admission medications   Medication Sig Start Date End Date Taking? Authorizing Provider  amiodarone (PACERONE) 200 MG tablet Take 1 tablet (200 mg total) by mouth daily. 01/19/15  Yes Oval Linsey, MD  amLODipine (NORVASC) 5 MG tablet Take 1 tablet (5 mg total) by mouth daily. 04/16/15  Yes Rhonda G Barrett, PA-C  calcium citrate-vitamin D (CITRACAL+D) 315-200 MG-UNIT per tablet Take 1 tablet by mouth daily.    Yes Historical Provider, MD  carvedilol (COREG) 12.5 MG tablet Take 1 tablet  (12.5 mg total) by mouth 2 (two) times daily with a meal. 01/19/15  Yes Oval Linsey, MD  Darbepoetin Alfa (ARANESP) 100 MCG/0.5ML SOSY injection Inject 0.5 mLs (100 mcg total) into the skin every 7 (seven) days. 04/13/15  Yes Iline Oven, MD  docusate sodium (COLACE) 100 MG capsule Take 100 mg by mouth at bedtime as needed for moderate constipation (constipation). Reported on 04/20/2015   Yes Historical Provider, MD  ELIQUIS 5 MG TABS tablet Take 5 mg by mouth 2 (two) times daily. 01/15/15  Yes Historical Provider, MD  famotidine (PEPCID) 20 MG tablet Take 20 mg before breakfast and 20 mg before supper 04/16/15  Yes Rhonda G Barrett, PA-C  fish oil-omega-3 fatty acids 1000 MG capsule Take 1 g by mouth daily. 01/15/12  Yes Oval Linsey, MD  fluticasone (FLONASE) 50 MCG/ACT nasal spray Place 1 spray into both nostrils daily as needed for allergies. 01/19/15  Yes Oval Linsey, MD  furosemide (LASIX) 20 MG tablet Take 3 tablets (60 mg total) by mouth daily. 04/10/15  Yes Iline Oven, MD  hydrALAZINE (APRESOLINE) 10 MG tablet Take 1 tablet (10 mg total) by mouth every 8 (eight) hours. 04/02/15  Yes Liberty Handy, MD  hydroxypropyl methylcellulose (ISOPTO TEARS) 2.5 % ophthalmic solution Place 2 drops into both eyes 4 (four) times daily as needed for dry eyes. Reported on 04/20/2015   Yes Historical Provider, MD  isosorbide mononitrate (IMDUR) 30 MG 24 hr tablet Take 1 tablet (30 mg total) by mouth daily. 05/29/15  Yes Oval Linsey, MD  levothyroxine (SYNTHROID, LEVOTHROID) 25 MCG tablet Take 1 tablet (25 mcg total) by mouth daily before breakfast. 06/01/15  Yes Oval Linsey, MD  Multiple Vitamins-Minerals (MULTIVITAMIN WITH MINERALS) tablet Take 1 tablet by mouth daily.   Yes Historical Provider, MD  ondansetron (ZOFRAN) 4 MG tablet Take 1 tablet (4 mg total) by mouth every 8 (eight) hours as needed for nausea or vomiting. 06/01/15  Yes Loleta Chance, MD  terazosin (HYTRIN) 5 MG capsule Take 1  capsule (5 mg total) by mouth at bedtime. 05/29/15  Yes Oval Linsey, MD  Nutritional Supplements (FEEDING SUPPLEMENT, NEPRO CARB STEADY,) LIQD Take 237 mLs by mouth daily. 06/15/15   Collier Salina, MD  warfarin (COUMADIN) 2.5 MG tablet Take 1 tablet (2.5 mg total) by mouth once. 06/15/15   Collier Salina, MD     Family History  Problem Relation Age of Onset  . Hypertension Mother   . Heart attack Father   . Breast cancer Sister   . Arthritis Sister   . Heart attack Brother   . Diabetes Brother   . Stroke Brother   . Pneumonia Daughter   . Diabetes Brother   . Alcoholism Brother   . Diabetes Brother   . Arthritis Sister     Bilateral knee replacement  . HIV Daughter   . Hypertension Daughter   . Drug abuse Daughter   . Schizophrenia Daughter   . Hypertension Daughter   . Drug abuse Daughter   . Bipolar disorder Daughter  Social History   Social History  . Marital Status: Married    Spouse Name: N/A  . Number of Children: N/A  . Years of Education: N/A   Social History Main Topics  . Smoking status: Former Smoker -- 1.00 packs/day for 10 years    Types: Cigarettes    Quit date: 06/16/1968  . Smokeless tobacco: Never Used  . Alcohol Use: No     Comment: "quit alcohol in the 1960's"  . Drug Use: No  . Sexual Activity: Not Currently    Birth Control/ Protection: None   Other Topics Concern  . None   Social History Narrative     Review of Systems: A 12 point ROS discussed and pertinent positives are indicated in the HPI above.  All other systems are negative.  Review of Systems  Constitutional: Positive for activity change and appetite change. Negative for fever and diaphoresis.  Respiratory: Negative for shortness of breath.   Neurological: Positive for weakness.  Psychiatric/Behavioral: Negative for behavioral problems and confusion.    Vital Signs: BP 118/83 mmHg  Pulse 77  Temp(Src) 100.2 F (37.9 C) (Oral)  Resp 18  Ht '5\' 8"'$  (1.727 m)   Wt 150 lb 5.7 oz (68.2 kg)  BMI 22.87 kg/m2  SpO2 100%  Physical Exam  Constitutional: He is oriented to person, place, and time.  Cardiovascular: Normal rate and regular rhythm.   Pulmonary/Chest: Effort normal and breath sounds normal.  Abdominal: Soft. Bowel sounds are normal.  Musculoskeletal: Normal range of motion.  Rt BKA  Neurological: He is alert and oriented to person, place, and time.  Skin: Skin is warm and dry.  Psychiatric: He has a normal mood and affect. His behavior is normal. Judgment and thought content normal.  Nursing note and vitals reviewed.   Mallampati Score:  MD Evaluation Airway: WNL Heart: WNL Abdomen: WNL Chest/ Lungs: WNL ASA  Classification: 3 Mallampati/Airway Score: One  Imaging: Ir Fluoro Guide Cv Line Right  06/09/2015  INDICATION: 70 year old with chronic kidney disease and needs hemodialysis. Plan for a non tunneled catheter because the patient is on anticoagulation medicine. EXAM: FLUOROSCOPIC AND ULTRASOUND GUIDED PLACEMENT OF NON TUNNELED DIALYSIS CATHETER Physician: Stephan Minister. Henn, MD MEDICATIONS: None ANESTHESIA/SEDATION: None FLUOROSCOPY TIME:  Fluoroscopy Time: 6 seconds, less than 1 mGy COMPLICATIONS: None immediate. PROCEDURE: Informed consent was obtained for placement of a non tunneled dialysis catheter. The patient was placed supine on the interventional table. Ultrasound confirmed a patent right internal jugular vein. Ultrasound images were obtained for documentation. The right side of the neck was prepped and draped in a sterile fashion. The right side of the neck was anesthetized with 1% lidocaine. Maximal barrier sterile technique was utilized including caps, mask, sterile gowns, sterile gloves, sterile drape, hand hygiene and skin antiseptic. A small incision was made with #11 blade scalpel. A 19 gauge needle directed into the right internal jugular vein with ultrasound guidance. A J wire was advanced into the IVC. The tract was dilated  to accommodate a 20 cm Trialysis catheter. The tip was placed in the right atrium. Lumens aspirated and flushed well. Appropriate amount of heparin was placed in the dialysis lumens. Catheter was sutured to the skin. Bandages placed over the catheter site. Fluoroscopic and ultrasound images were taken and saved for documentation. FINDINGS: Catheter tip in the right atrium. IMPRESSION: Successful placement of a non tunneled dialysis catheter using ultrasound and fluoroscopic guidance. Electronically Signed   By: Markus Daft M.D.   On: 06/09/2015  18:00   Ir US Guide Vasc Access Right  06/09/2015  INDICATION: 70 year old with chronic kidney disease and needs hemodialysis. Plan for a non tunneled catheter because the patient is on anticoagulation medicine. EXAM: FLUOROSCOPIC AND ULTRASOUND GUIDED PLACEMENT OF NON TUNNELED DIALYSIS CATHETER Physician: Stephan Minister. Henn, MD MEDICATIONS: None ANESTHESIA/SEDATION: None FLUOROSCOPY TIME:  Fluoroscopy Time: 6 seconds, less than 1 mGy COMPLICATIONS: None immediate. PROCEDURE: Informed consent was obtained for placement of a non tunneled dialysis catheter. The patient was placed supine on the interventional table. Ultrasound confirmed a patent right internal jugular vein. Ultrasound images were obtained for documentation. The right side of the neck was prepped and draped in a sterile fashion. The right side of the neck was anesthetized with 1% lidocaine. Maximal barrier sterile technique was utilized including caps, mask, sterile gowns, sterile gloves, sterile drape, hand hygiene and skin antiseptic. A small incision was made with #11 blade scalpel. A 19 gauge needle directed into the right internal jugular vein with ultrasound guidance. A J wire was advanced into the IVC. The tract was dilated to accommodate a 20 cm Trialysis catheter. The tip was placed in the right atrium. Lumens aspirated and flushed well. Appropriate amount of heparin was placed in the dialysis lumens.  Catheter was sutured to the skin. Bandages placed over the catheter site. Fluoroscopic and ultrasound images were taken and saved for documentation. FINDINGS: Catheter tip in the right atrium. IMPRESSION: Successful placement of a non tunneled dialysis catheter using ultrasound and fluoroscopic guidance. Electronically Signed   By: Markus Daft M.D.   On: 06/09/2015 18:00   Dg Chest Port 1 View  06/13/2015  CLINICAL DATA:  Status post dialysis catheter placement EXAM: PORTABLE CHEST 1 VIEW COMPARISON:  03/29/2015 FINDINGS: Cardiac shadow is mildly enlarged. A dialysis catheter is now noted in satisfactory position. No pneumothorax is noted. The lungs are clear. IMPRESSION: No complicating factors following dialysis catheter placement. Electronically Signed   By: Inez Catalina M.D.   On: 06/13/2015 12:51   Dg Fluoro Guide Cv Line-no Report  06/13/2015  CLINICAL DATA:  FLOURO GUIDE CV LINE Fluoroscopy was utilized by the requesting physician.  No radiographic interpretation.    Labs:  CBC:  Recent Labs  06/16/15 0629 06/17/15 0431 06/18/15 0648 06/18/15 0800  WBC 6.2 5.1 6.9 6.6  HGB 10.3* 9.6* 10.4* 10.0*  HCT 31.7* 28.8* 31.6* 31.3*  PLT 36* 26* 50* 54*    COAGS:  Recent Labs  12/11/14 0430 12/12/14 0506 06/12/15 1242 06/13/15 0934 06/16/15 0629 06/18/15 0648  INR 1.67* 1.39 1.21 1.13 1.27 1.26  APTT 42* 39* 40*  --  38*  --     BMP:  Recent Labs  06/13/15 0700 06/16/15 0629 06/17/15 0431 06/18/15 0800  NA 134* 134* 135 133*  K 5.0 3.9 4.0 4.0  CL 98* 100* 101 101  CO2 '23 25 28 22  '$ GLUCOSE 103* 104* 141* 163*  BUN 36* 42* 24* 40*  CALCIUM 8.4* 8.2* 7.8* 8.1*  CREATININE 3.43* 3.95* 2.55* 3.08*  GFRNONAA 16* 14* 24* 19*  GFRAA 19* 16* 27* 22*    LIVER FUNCTION TESTS:  Recent Labs  12/10/14 1052 12/11/14 0430 12/12/14 0506  06/08/15 1919  06/12/15 0859 06/13/15 0700 06/16/15 0629 06/18/15 0800  BILITOT 0.5 0.5 0.3  --  0.8  --   --   --   --   --     AST 59* 28 27  --  41  --   --   --   --   --  ALT 24 20 16*  --  36  --   --   --   --   --   ALKPHOS 66 63 65  --  57  --   --   --   --   --   PROT 6.9 6.1* 6.1*  --  7.2  --   --   --   --   --   ALBUMIN 2.6* 2.4*  2.4* 2.4*  < > 2.8*  < > 2.6* 2.5* 2.2* 2.2*  < > = values in this interval not displayed.  TUMOR MARKERS: No results for input(s): AFPTM, CEA, CA199, CHROMGRNA in the last 8760 hours.  Assessment and Plan:  Thrombocytopenia Hep C IDDM; ESRD Scheduled for Bone marrow biopsy 3/28 in Radiology Risks and Benefits discussed with the patient including, but not limited to bleeding, infection, damage to adjacent structures or low yield requiring additional tests. All of the patient's questions were answered, patient is agreeable to proceed. Consent signed and in chart.   Thank you for this interesting consult.  I greatly enjoyed meeting [name] [name] and look forward to participating in their care.  A copy of this report was sent to the requesting provider on this date.  Electronically Signed: Monia Sabal A 06/18/2015, 10:16 AM   I spent a total of 20 Minutes    in face to face in clinical consultation, greater than 50% of which was counseling/coordinating care for bone marrow bx

## 2015-06-18 NOTE — Telephone Encounter (Signed)
Pt still in house today.

## 2015-06-18 NOTE — Progress Notes (Signed)
Internal Medicine Attending:   I saw and examined the patient. I reviewed the resident's note and I agree with the resident's findings and plan as documented in the resident's note.  Patient remains in house for evaluation of labile thrombocytopenia. He had a nadir platelet count of 16,000, today it is up to 50,000 without any interventions. We appreciate Dr. Antonieta Pert consultation and are pursuing bone marrow biopsy tomorrow to look for the underlying cause. Holding coumadin for now, and likely restart tomorrow after the biopsy if there is no significant bleeding. HD inhouse tomorrow, and hopefully we can arrange for discharge and outpatient HD start by Thursday.

## 2015-06-18 NOTE — Progress Notes (Signed)
[facility] Progress Note  Assessment/Plan: 1.ESRD/ new start to HD - Was due to be DC'd home 03/24 however plt down to 16. OP HD center has been notified. HD orders sent. On TTS schedule at [facility]. For HD tomorrow either here of OP center.   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 .542.  Platelets up to 54 03/27, INR 1.26. Use citrate to flush HD catheter.  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 BS appears well controlled. 4 CM EF 25-30% per primary. No edema/SOB 5 Hep C pos viral load 6 A flutter - eliquis to be changed to coumadin, will f/u in coumadin clinic per primary team notes, on amio. Coreg dc'd for low BP / HR. Rate controlled. INR subtherapeutic.  7. HD access: AVF placement has been deferred due to low platelet count. To F/U with Dr. Early/VVS when OP for perm access.   Disposition:  Hopefully home today as platelet count is improving. Will notify OP center if DC'd today. Orders already sent. No heparin on this pt.    Rita H. Brown NP-C 06/18/2015, 9:35 AM  [facility] [phone]  Subjective:  "I don't know if they're going to decide to stay or go". Ready to go home. No C/Os. Wife at bedside.   Objective Filed Vitals:   06/17/15 1000 06/17/15 1711 06/17/15 2019 06/18/15 0640  BP: 140/70 144/77 137/88 118/83  Pulse: 82 73 73 77  Temp: 98.6 F (37 C)  99.1 F (37.3 C) 100.2 F (37.9 C)  TempSrc: Oral  Oral Oral  Resp: Height:      Weight:   68.2 kg (150 lb 5.7 oz)   SpO2: 98%  99% 100%    Physical Exam General: Very pleasant, NAD Heart: S1, S2, NO R/M/G  Lungs: Bilateral breath sounds CTA.  Abdomen: soft nontender with active BS Extremities: R BKA, no LE edema.  Dialysis Access: RIJ perm cath Drsg CDI.   Dialysis Orders: 2.0/2.25 4 hours BFR 400 Autoflow 1.5 EDW 66.5  Additional Objective Labs: Basic Metabolic Panel:  Recent Labs Lab 06/13/15 0700 06/16/15 0629  06/17/15 0431 06/18/15 0800  NA 134* 134* 135 133*  K 5.0 3.9 4.0 4.0  CL 98* 100* 101 101  CO2 GLUCOSE 103* 104* 141* 163*  BUN 36* 42* 24* 40*  CREATININE 3.43* 3.95* 2.55* 3.08*  CALCIUM 8.4* 8.2* 7.8* 8.1*  PHOS 2.7 2.7  --  3.1   Liver Function Tests:  Recent Labs Lab 06/13/15 0700 06/16/15 0629 06/18/15 0800  ALBUMIN 2.5* 2.2* 2.2*   No results for input(s): LIPASE, AMYLASE in the last 168 hours. CBC:  Recent Labs Lab 06/15/15 1513 06/16/15 0629 06/17/15 0431 06/18/15 0648 06/18/15 0800  WBC 6.2 6.2 5.1 6.9 6.6  HGB 11.2* 10.3* 9.6* 10.4* 10.0*  HCT 33.7* 31.7* 28.8* 31.6* 31.3*  MCV 70.8* 68.9* 70.1* 69.9* 69.2*  PLT 21* 36* 26* 50* 54*   Blood Culture    Component Value Date/Time   SDES URINE, RANDOM 12/10/2014 1324   SPECREQUEST NONE 12/10/2014 1324   CULT 50,000 COLONIES/mL PROTEUS MIRABILIS 12/10/2014 1324   REPTSTATUS 12/13/2014 FINAL 12/10/2014 1324    Cardiac Enzymes: No results for input(s): CKTOTAL, CKMB, CKMBINDEX, TROPONINI in the last 168 hours. CBG:  Recent Labs Lab 06/17/15 0748 06/17/15 1126 06/17/15 1619 06/17/15 2018 06/18/15 0812  GLUCAP 127* 125* 110* 164* 165*   Iron Studies: No  results for input(s): IRON, TIBC, TRANSFERRIN, FERRITIN in the last 72 hours. @lablastinr3 @ Studies/Results: No results found. Medications:   . amiodarone  200 mg Oral Daily  . famotidine  20 mg Oral Daily  . feeding supplement (NEPRO CARB STEADY)  237 mL Oral BID BM  . ferric gluconate (FERRLECIT/NULECIT) IV  250 mg Intravenous Q T,Th,Sa-HD  . insulin aspart  0-9 Units Subcutaneous TID WC  . levothyroxine  50 mcg Oral QAC breakfast  . multivitamin  1 tablet Oral QHS     I have seen and examined this patient and agree with plan as outlined by Alonna Bucklerita Brown, NP. Jomarie LongsJoseph A Harman Langhans,MD 06/18/2015 2:40 PM

## 2015-06-18 NOTE — Progress Notes (Signed)
Subjective: No complaints today besides wanting to go home. Improvement in thrombocytopenia today. He spoke with heme/onc service Dr. Marin Olp yesterday and is agreeable to having bone marrow biopsy tomorrow.  Objective: Vital signs in last 24 hours: Filed Vitals:   06/17/15 1711 06/17/15 2019 06/18/15 0640 06/18/15 0900  BP: 144/77 137/88 118/83 120/75  Pulse: 73 73 77 76  Temp:  99.1 F (37.3 C) 100.2 F (37.9 C) 98.9 F (37.2 C)  TempSrc:  Oral Oral Oral  Resp:  _0 Height:      Weight:  68.2 kg (150 lb 5.7 oz)    SpO2:  99% 100% 100%   Weight change: 0.7 kg (1 lb 8.7 oz)  Intake/Output Summary (Last 24 hours) at 06/18/15 1442 Last data filed at 06/18/15 0615  Gross per 24 hour  Intake      0 ml  Output    400 ml  Net   -400 ml   Physical Exam:  GENERAL- alert, co-operative, NAD HEENT- RIJ tunneled HD cath w/ dry intact dressing CARDIAC- RRR, no murmurs, rubs or gallops. RESP- CTAB, no wheezes or crackles. ABDOMEN- Soft, nontender, no guarding or rebound EXTREMITIES- No edema. RLE BKA. SKIN- Warm, dry, No rash or lesion. PSYCH- Normal mood and affect, appropriate thought content and speech.  CBC:  Recent Labs  06/18/15 0648 06/18/15 0800  WBC 6.9 6.6  HGB 10.4* 10.0*  HCT 31.6* 31.3*  MCV 69.9* 69.2*  PLT 50* 54*   Medications: I have reviewed the patient's current medications. Scheduled Meds: . amiodarone  200 mg Oral Daily  . famotidine  20 mg Oral Daily  . [START ON 06/19/2015] feeding supplement (NEPRO CARB STEADY)  237 mL Oral Q1500  . ferric gluconate (FERRLECIT/NULECIT) IV  250 mg Intravenous Q T,Th,Sa-HD  . insulin aspart  0-9 Units Subcutaneous TID WC  . levothyroxine  50 mcg Oral QAC breakfast  . multivitamin  1 tablet Oral QHS   Continuous Infusions:  PRN Meds:.acetaminophen, docusate sodium, fluticasone, ondansetron   Assessment/Plan: Acute on Chronic Thrombocytopenia:. No signs of bleeding/thrombosis. Platelet count improved  today without intervention. SRA still pending but very unlikely given minimal elevation in heparin induced anti platelet antibody assay. Hematology service was consulted for their recommendations when platelet count worsened after initial improvement. 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 We will delay starting any presumptive therapy for ITP until after this study. - CBC as follows:   Recent Labs Lab 06/17/15 0431 06/18/15 0648 06/18/15 0800  HGB 9.6* 10.4* 10.0*  HCT 28.8* 31.6* 31.3*  WBC 5.1 6.9 6.6  PLT 26* 50* 54*  -Heme/onc consulted, recommendations appreciated -Will need rescheduling of Coumadin clinic appointment 06/18/15 -[facility] clinic appointment 06/29/15 -Heme/onc consulted for presumed ITP -repeat CBC tomorrow am  ESRD TTHS HD: newly started this admission, will need outpatient AVF placement once his thrombocytopenia resolves. Has RIJ HD cath currently. -HD per nephrology, recommendations appreciated -Continue Nulecit, Renavit  Atrial Flutter: On Eliquis PTA. Medication discontinued for access placement initially. At discharge will plan transition to coumadin as this is the only recommended anticoagulant studied in ESRD population. Holding for today due to continued low platelets, bone biopsy tomorrow. -Holding coumadin today  Chronic Combined CHF: euvolemic  -Consider resuming low dose BB in outpatient clinic if HR can tolerate -Now ESRD, could use ACEI  Diet: Carb mod/renal VTE ppx: SCDs  FULL CODE  Dispo: dispo deferred until further workup of thrombocytopenia  The patient does have a current PCP Oval Linsey, MD).   LOS: 10 days   Collier Salina, MD 06/18/2015, 2:42 PM

## 2015-06-18 NOTE — Progress Notes (Signed)
Physical Therapy Treatment Patient Details Name: [name] [name] MRN: [id number] DOB: 09/20/1945 Today's Date: 06/18/2015    History of Present Illness 70 y.o. male admitted with Symptomatic uremia 2/2 ESRD,  s/p INSERTION OF DIALYSIS CATHETER, 3/22.    PT Comments    Pt with improved ambulation tolerance but con't to require increased assist for transfers and ADLs. Wife able to provide this assist. R LE prosthesis is too large due to significant weight loss. Talked to Kathlene NovemberMike from [employer] who is to come assess patient and prosthesis tomorrow AM (3/28). Acute Pt to con't to follow as able.   Follow Up Recommendations  Home health PT     Equipment Recommendations  None recommended by PT    Recommendations for Other Services       Precautions / Restrictions Precautions Precautions: Fall Precaution Comments: R LE prosthesis Restrictions Weight Bearing Restrictions: No    Mobility  Bed Mobility Overal bed mobility: Modified Independent             General bed mobility comments: increased time  Transfers Overall transfer level: Needs assistance Equipment used: Rolling walker (2 wheeled) Transfers: Sit to/from Stand Sit to Stand: Mod assist (from bed, minA from raised bed)         General transfer comment: modA for initial power up  Ambulation/Gait Ambulation/Gait assistance: Mod assist Ambulation Distance (Feet): 35 Feet Assistive device: Rolling walker (2 wheeled) Gait Pattern/deviations: Step-to pattern Gait velocity: slow   General Gait Details: min/modA for walker management as pt with tendency to push it way out in front of him, increased time to sequence prosthesis   Stairs            Wheelchair Mobility    Modified Rankin (Stroke Patients Only)       Balance Overall balance assessment: Needs assistance Sitting-balance support: No upper extremity supported Sitting balance-Leahy Scale: Fair Sitting balance - Comments: pt required assist to  don sock, jeans, shoes, and R prosthesis while sitting EOB   Standing balance support: Bilateral upper extremity supported Standing balance-Leahy Scale: Poor Standing balance comment: pt able to stand x 2 min to don R prosthesis                    Cognition Arousal/Alertness: Awake/alert Behavior During Therapy: WFL for tasks assessed/performed Overall Cognitive Status: Within Functional Limits for tasks assessed                      Exercises      General Comments General comments (skin integrity, edema, etc.): wife present and assist with chair follow and donning of prosthesis      Pertinent Vitals/Pain Pain Assessment: No/denies pain    Home Living                      Prior Function            PT Goals (current goals can now be found in the care plan section) Acute Rehab PT Goals Patient Stated Goal: go home Progress towards PT goals: Progressing toward goals    Frequency  Min 3X/week    PT Plan Current plan remains appropriate    Co-evaluation             End of Session Equipment Utilized During Treatment: Gait belt Activity Tolerance: Patient tolerated treatment well Patient left: in chair;with call bell/phone within reach;with chair alarm set;with family/visitor present     Time: [id number] PT Time  Calculation (min) (ACUTE ONLY): 25 min  Charges:  $Gait Training: 8-22 mins $Therapeutic Activity: 8-22 mins                    G Codes:      Marcene Brawn 06/18/2015, 3:38 PM   [name] Shock, PT, DPT Pager #: [phone] Office #: [phone]

## 2015-06-19 ENCOUNTER — Inpatient Hospital Stay (HOSPITAL_COMMUNITY): Payer: Medicare Other

## 2015-06-19 DIAGNOSIS — D649 Anemia, unspecified: Secondary | ICD-10-CM

## 2015-06-19 LAB — CBC
HEMATOCRIT: 29.4 % — AB (ref 39.0–52.0)
HEMOGLOBIN: 9.4 g/dL — AB (ref 13.0–17.0)
MCH: 22.1 pg — ABNORMAL LOW (ref 26.0–34.0)
MCHC: 32 g/dL (ref 30.0–36.0)
MCV: 69.2 fL — ABNORMAL LOW (ref 78.0–100.0)
Platelets: 68 10*3/uL — ABNORMAL LOW (ref 150–400)
RBC: 4.25 MIL/uL (ref 4.22–5.81)
RDW: 16.2 % — ABNORMAL HIGH (ref 11.5–15.5)
WBC: 6.2 10*3/uL (ref 4.0–10.5)

## 2015-06-19 LAB — RENAL FUNCTION PANEL
Albumin: 2 g/dL — ABNORMAL LOW (ref 3.5–5.0)
Anion gap: 11 (ref 5–15)
BUN: 60 mg/dL — AB (ref 6–20)
CHLORIDE: 99 mmol/L — AB (ref 101–111)
CO2: 23 mmol/L (ref 22–32)
CREATININE: 3.93 mg/dL — AB (ref 0.61–1.24)
Calcium: 7.8 mg/dL — ABNORMAL LOW (ref 8.9–10.3)
GFR calc Af Amer: 16 mL/min — ABNORMAL LOW (ref >60)
GFR calc non Af Amer: 14 mL/min — ABNORMAL LOW (ref >60)
Glucose, Bld: 167 mg/dL — ABNORMAL HIGH (ref 65–99)
POTASSIUM: 4.5 mmol/L (ref 3.5–5.1)
Phosphorus: 4.2 mg/dL (ref 2.5–4.6)
Sodium: 133 mmol/L — ABNORMAL LOW (ref 135–145)

## 2015-06-19 LAB — BONE MARROW EXAM

## 2015-06-19 LAB — PROTIME-INR
INR: 1.16 (ref 0.00–1.49)
PROTHROMBIN TIME: 15 s (ref 11.6–15.2)

## 2015-06-19 LAB — GLUCOSE, CAPILLARY
GLUCOSE-CAPILLARY: 93 mg/dL (ref 65–99)
GLUCOSE-CAPILLARY: 112 mg/dL — AB (ref 65–99)

## 2015-06-19 MED ORDER — WARFARIN - PHYSICIAN DOSING INPATIENT: Freq: Every day | Status: DC

## 2015-06-19 MED ORDER — LIDOCAINE HCL 1 % IJ SOLN
INTRAMUSCULAR | Status: AC
  Filled 2015-06-19: qty 20

## 2015-06-19 MED ORDER — MIDAZOLAM HCL 2 MG/2ML IJ SOLN
INTRAMUSCULAR | Status: AC
  Filled 2015-06-19: qty 4

## 2015-06-19 MED ORDER — MIDAZOLAM HCL 2 MG/2ML IJ SOLN
INTRAMUSCULAR | Status: AC | PRN
  Administered 2015-06-19 (×2): 1 mg via INTRAVENOUS

## 2015-06-19 MED ORDER — FENTANYL CITRATE (PF) 100 MCG/2ML IJ SOLN
INTRAMUSCULAR | Status: AC | PRN
  Administered 2015-06-19: 50 ug via INTRAVENOUS

## 2015-06-19 MED ORDER — DARBEPOETIN ALFA 100 MCG/0.5ML IJ SOSY
PREFILLED_SYRINGE | INTRAMUSCULAR | Status: AC
  Filled 2015-06-19: qty 0.5

## 2015-06-19 MED ORDER — FENTANYL CITRATE (PF) 100 MCG/2ML IJ SOLN
INTRAMUSCULAR | Status: AC
  Filled 2015-06-19: qty 4

## 2015-06-19 MED ORDER — DARBEPOETIN ALFA 100 MCG/0.5ML IJ SOSY
100.0000 ug | PREFILLED_SYRINGE | INTRAMUSCULAR | Status: DC
  Administered 2015-06-19: 100 ug via INTRAVENOUS
  Filled 2015-06-19: qty 0.5

## 2015-06-19 MED ORDER — SODIUM CHLORIDE 0.9 % IV SOLN
INTRAVENOUS | Status: AC | PRN
  Administered 2015-06-19: 10 mL/h via INTRAVENOUS

## 2015-06-19 MED ORDER — WARFARIN SODIUM 2.5 MG PO TABS
2.5000 mg | ORAL_TABLET | Freq: Every day | ORAL | Status: DC
  Administered 2015-06-19: 2.5 mg via ORAL
  Filled 2015-06-19: qty 1

## 2015-06-19 NOTE — Progress Notes (Signed)
No bleeding noted. Getting dialysis. Renal function is stable.  I think he is going for his bone marrow biopsy today.  His clinic count is improving. This may be secondary to improvement in any type of infection that he may have had.  The hepatitis C clearly needs to be treated. If his thrombocytopenia is based on hepatitis C, he will never get better unless the hep C is treated.  He is slightly anemic. Again this is really no surprise.  It is important that his uremia improved. This definitely leads to platelet dysfunction. Sometimes in this situation, high-dose estrogen can help with platelet function.  We will have to see what the bone marrow biopsy shows. This definitely will be critical. If he has the bone marrow biopsy done today, the results probably will not be until Thursday or Friday.  Wilkinson 46:1

## 2015-06-19 NOTE — Progress Notes (Signed)
Discharge instructions and medications discussed with patient and family member. Patient showed no barriers to discharge. IV removed; catheter intact. Assessment unchanged. Patient discharged to home with family.

## 2015-06-19 NOTE — Sedation Documentation (Signed)
Patient is resting comfortably. 

## 2015-06-19 NOTE — Sedation Documentation (Signed)
Patient is resting comfortably. Some discomfort, additional medication given per dr. Elgie CongoMcCuloough

## 2015-06-19 NOTE — Procedures (Signed)
Interventional Radiology Procedure Note  Procedure: CT guided aspirate and core biopsy of right iliac bone Complications: None Recommendations: - Bedrest supine x 1 hrs - Hydrocodone PRN  Pain - Follow biopsy results  Signed,  Heath K. McCullough, MD   

## 2015-06-19 NOTE — Progress Notes (Signed)
Subjective:  Back from bm BX  No cos  For hd today on TTS schedule  Objective Vital signs in last 24 hours: Filed Vitals:   06/19/15 1035 06/19/15 1040 06/19/15 1045 06/19/15 1054  BP: 139/73 131/76 115/73 107/67  Pulse: 78 77 84 84  Temp:      TempSrc:      Resp: 24 26 22 20   Height:      Weight:      SpO2: 100% 100% 100% 100%   Weight change: 1.8 kg (3 lb 15.5 oz)  Physical Exam: General: alert/ NAD pleasant  Heart: RRR, no r,m, gall Lungs: CTA  Bilat Abdomen: soft , NT, ND Extremities: R BKA no stump edema / L LE no pedal edema  Dialysis Access: Rij perm cath    OP Dialysis Orders: 2.0/2.25 4 hours BFR 400 Autoflow 1.5 EDW 66.5   Problem/Plan= 1.ESRD/ new start to HD  On TTS schedule = [facility].  Hd today no hep..  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 .542. Platelets up to 68K Admit team consulted HEM. with Dr. Myna HidalgoEnnever seeing /sp BM this am  . Use citrate to flush HD catheter.  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 BS stable  4 CM EF 25-30% per primary. No pedal edema/or SOB 5 Hep C pos viral load 6 A flutter - eliquis to be changed to coumadin, will f/u in coumadin clinic per primary team notes, on amio. Coreg dc'd for low BP / HR. Rate controlled. INR subtherapeutic.  7. HD access: AVF placement has been deferred due to low platelet count. To F/U with Dr. Early/VVS when OP for perm access. 8. Anemia/ Of ESRD and Iron DEF. Sp iron load  hgb 9.4 < 10.0 <10.4 start ESA ARANESP 100mg  today  Fu hgbs as op  9. 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 no binders/ no IpTH  Will fu as op/ no vit d    Lenny Pastelavid Zeyfang, PA-C [facility] [phone] 06/19/2015,11:40 AM  LOS: 11 days   Labs: Basic Metabolic Panel:  Recent Labs Lab 06/13/15 0700 06/16/15 0629 06/17/15 0431 06/18/15 0800  NA 134* 134* 135 133*  K 5.0 3.9 4.0 4.0  CL 98* 100* 101 101  CO2 23 25 28 22   GLUCOSE 103* 104* 141* 163*  BUN 36* 42* 24*  40*  CREATININE 3.43* 3.95* 2.55* 3.08*  CALCIUM 8.4* 8.2* 7.8* 8.1*  PHOS 2.7 2.7  --  3.1   Liver Function Tests:  Recent Labs Lab 06/13/15 0700 06/16/15 0629 06/18/15 0800  ALBUMIN 2.5* 2.2* 2.2*   No results for input(s): LIPASE, AMYLASE in the last 168 hours. No results for input(s): AMMONIA in the last 168 hours. CBC:  Recent Labs Lab 06/16/15 0629 06/17/15 0431 06/18/15 0648 06/18/15 0800 06/19/15 0710  WBC 6.2 5.1 6.9 6.6 6.2  HGB 10.3* 9.6* 10.4* 10.0* 9.4*  HCT 31.7* 28.8* 31.6* 31.3* 29.4*  MCV 68.9* 70.1* 69.9* 69.2* 69.2*  PLT 36* 26* 50* 54* 68*   Cardiac Enzymes: No results for input(s): CKTOTAL, CKMB, CKMBINDEX, TROPONINI in the last 168 hours. CBG:  Recent Labs Lab 06/18/15 1217 06/18/15 1732 06/18/15 2133 06/19/15 0746 06/19/15 1123  GLUCAP 175* 128* 127* 93 112*    Studies/Results: No results found. Medications:   . amiodarone  200 mg Oral Daily  . famotidine  20 mg Oral Daily  . feeding supplement (NEPRO CARB STEADY)  237 mL Oral Q1500  .  fentaNYL      . ferric gluconate (FERRLECIT/NULECIT) IV  250 mg Intravenous Q T,Th,Sa-HD  . insulin aspart  0-9 Units Subcutaneous TID WC  . levothyroxine  50 mcg Oral QAC breakfast  . lidocaine      . midazolam      . multivitamin  1 tablet Oral QHS  . warfarin  2.5 mg Oral q1800  . Warfarin - Physician Dosing Inpatient   Does not apply q1800   I have seen and examined this patient and agree with plan as outlined by Lenny Pastel, PA-C.  For HD later today. Jomarie Longs A Sia Gabrielsen,MD 06/19/2015 3:10 PM

## 2015-06-19 NOTE — Discharge Summary (Signed)
Name: [name] [name] MRN: [id number] DOB: Sep 06, 1945 70 y.o. PCP: Oval Linsey, MD  Date of Admission: 06/08/2015  4:24 PM Date of Discharge: 06/19/2015 Attending Physician: Dr. Lalla Brothers  Discharge Diagnosis: End stage renal disease Thrombocytopenia Chronic Hepatitis C Coronary artery disease Paroxysmal atrial flutter Protein-calorie malnutrition Hypothyroidism Hypertension  Discharge Medications:   Medication List    STOP taking these medications        amLODipine 5 MG tablet  Commonly known as:  NORVASC     ELIQUIS 5 MG Tabs tablet  Generic drug:  apixaban     furosemide 20 MG tablet  Commonly known as:  LASIX     hydrALAZINE 10 MG tablet  Commonly known as:  APRESOLINE     isosorbide mononitrate 30 MG 24 hr tablet  Commonly known as:  IMDUR      TAKE these medications        amiodarone 200 MG tablet  Commonly known as:  PACERONE  Take 1 tablet (200 mg total) by mouth daily.     calcium citrate-vitamin D 315-200 MG-UNIT tablet  Commonly known as:  CITRACAL+D  Take 1 tablet by mouth daily.     carvedilol 12.5 MG tablet  Commonly known as:  COREG  Take 1 tablet (12.5 mg total) by mouth 2 (two) times daily with a meal.     Darbepoetin Alfa 100 MCG/0.5ML Sosy injection  Commonly known as:  ARANESP  Inject 0.5 mLs (100 mcg total) into the skin every 7 (seven) days.     docusate sodium 100 MG capsule  Commonly known as:  COLACE  Take 100 mg by mouth at bedtime as needed for moderate constipation (constipation). Reported on 04/20/2015     famotidine 20 MG tablet  Commonly known as:  PEPCID  Take 20 mg before breakfast and 20 mg before supper     feeding supplement (NEPRO CARB STEADY) Liqd  Take 237 mLs by mouth daily.     fish oil-omega-3 fatty acids 1000 MG capsule  Take 1 g by mouth daily.     fluticasone 50 MCG/ACT nasal spray  Commonly known as:  FLONASE  Place 1 spray into both nostrils daily as needed for allergies.     hydroxypropyl methylcellulose / hypromellose 2.5 % ophthalmic solution  Commonly known as:  ISOPTO TEARS / GONIOVISC  Place 2 drops into both eyes 4 (four) times daily as needed for dry eyes. Reported on 04/20/2015     levothyroxine 25 MCG tablet  Commonly known as:  SYNTHROID, LEVOTHROID  Take 1 tablet (25 mcg total) by mouth daily before breakfast.     multivitamin with minerals tablet  Take 1 tablet by mouth daily.     ondansetron 4 MG tablet  Commonly known as:  ZOFRAN  Take 1 tablet (4 mg total) by mouth every 8 (eight) hours as needed for nausea or vomiting.     terazosin 5 MG capsule  Commonly known as:  HYTRIN  Take 1 capsule (5 mg total) by mouth at bedtime.     warfarin 2.5 MG tablet  Commonly known as:  COUMADIN  Take 1 tablet (2.5 mg total) by mouth once.        Disposition and follow-up:   [name] [name] was discharged from [facility] in Good condition.  At the hospital follow up visit please address:  End stage renal disease newly on hemodialysis: His access is currently through tunneled RIJ HD catheter. On TTS HD schedule. AVF  placement was deferred due to thrombocytopenia but he will need to have this procedure once his platelets improve on outpatient follow up.  Thrombocytpenia: Acute on chronic isolated thrombocytopenia. Platelets decreased to a nadir of 16,000. 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 Bone marrow biopsy was obtained to rule out malignancy or myleopdysplastic disorder and showed appropriate elevated megakaryocytes.  Chronic hepatitis C: Previously untreated due to no problems of cirrhosis. 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 He will ultimately need follow up with hepatitis C clinic for treatment.  Paroxysmal atrial flutter: Previously on Eliquis, now transitioning to coumadin  as this is the studied and recommended anticoagulant of choice in this population. Discharged on 2.'5mg'$  daily to be titrated/monitored at coumadin clinic.  Labs / imaging needed at time of follow-up: CBC  Pending labs/ test needing follow-up: None  Follow-up Appointments: Follow-up Information    Follow up with [facility]. Go on 06/25/2015.   Why:  @ 10:30AM for INR check and dose adjustment   Contact information:   [address] [place] [phone]      Follow up with Hinton Lovely, MD. Go on 06/29/2015.   Specialty:  Internal Medicine   Why:  '@2'$ :15pm for hospital follow up   Contact information:   [place] [id number] [phone]       Discharge Instructions:   Consultations: Treatment Team:  Estanislado Emms, MD Rosetta Posner, MD Volanda Napoleon, MD  Procedures Performed:  Ir Cyndy Freeze Guide Cv Line Right  06/09/2015  INDICATION: 70 year old with chronic kidney disease and needs hemodialysis. Plan for a non tunneled catheter because the patient is on anticoagulation medicine. EXAM: FLUOROSCOPIC AND ULTRASOUND GUIDED PLACEMENT OF NON TUNNELED DIALYSIS CATHETER Physician: Stephan Minister. Henn, MD MEDICATIONS: None ANESTHESIA/SEDATION: None FLUOROSCOPY TIME:  Fluoroscopy Time: 6 seconds, less than 1 mGy COMPLICATIONS: None immediate. PROCEDURE: Informed consent was obtained for placement of a non tunneled dialysis catheter. The patient was placed supine on the interventional table. Ultrasound confirmed a patent right internal jugular vein. Ultrasound images were obtained for documentation. The right side of the neck was prepped and draped in a sterile fashion. The right side of the neck was anesthetized with 1% lidocaine. Maximal barrier sterile technique was utilized including caps, mask, sterile gowns, sterile gloves, sterile drape, hand hygiene and skin antiseptic. A small incision was made with #11 blade scalpel. A 19 gauge  needle directed into the right internal jugular vein with ultrasound guidance. A J wire was advanced into the IVC. The tract was dilated to accommodate a 20 cm Trialysis catheter. The tip was placed in the right atrium. Lumens aspirated and flushed well. Appropriate amount of heparin was placed in the dialysis lumens. Catheter was sutured to the skin. Bandages placed over the catheter site. Fluoroscopic and ultrasound images were taken and saved for documentation. FINDINGS: Catheter tip in the right atrium. IMPRESSION: Successful placement of a non tunneled dialysis catheter using ultrasound and fluoroscopic guidance. Electronically Signed   By: Markus Daft M.D.   On: 06/09/2015 18:00   Ir US Guide Vasc Access Right  06/09/2015  INDICATION: 70 year old with chronic kidney disease and needs hemodialysis. Plan for a non tunneled catheter because the patient is on anticoagulation medicine. EXAM: FLUOROSCOPIC AND ULTRASOUND GUIDED PLACEMENT OF NON TUNNELED DIALYSIS CATHETER  Physician: Stephan Minister. Henn, MD MEDICATIONS: None ANESTHESIA/SEDATION: None FLUOROSCOPY TIME:  Fluoroscopy Time: 6 seconds, less than 1 mGy COMPLICATIONS: None immediate. PROCEDURE: Informed consent was obtained for placement of a non tunneled dialysis catheter. The patient was placed supine on the interventional table. Ultrasound confirmed a patent right internal jugular vein. Ultrasound images were obtained for documentation. The right side of the neck was prepped and draped in a sterile fashion. The right side of the neck was anesthetized with 1% lidocaine. Maximal barrier sterile technique was utilized including caps, mask, sterile gowns, sterile gloves, sterile drape, hand hygiene and skin antiseptic. A small incision was made with #11 blade scalpel. A 19 gauge needle directed into the right internal jugular vein with ultrasound guidance. A J wire was advanced into the IVC. The tract was dilated to accommodate a 20 cm Trialysis catheter. The tip  was placed in the right atrium. Lumens aspirated and flushed well. Appropriate amount of heparin was placed in the dialysis lumens. Catheter was sutured to the skin. Bandages placed over the catheter site. Fluoroscopic and ultrasound images were taken and saved for documentation. FINDINGS: Catheter tip in the right atrium. IMPRESSION: Successful placement of a non tunneled dialysis catheter using ultrasound and fluoroscopic guidance. Electronically Signed   By: Markus Daft M.D.   On: 06/09/2015 18:00   Ct Biopsy  06/19/2015  INDICATION: 70 year old male with thrombocytopenia EXAM: CT GUIDED BONE MARROW ASPIRATION AND CORE BIOPSY Interventional Radiologist:  Criselda Peaches, MD MEDICATIONS: None. ANESTHESIA/SEDATION: Moderate (conscious) sedation was employed during this procedure. A total of 2 milligrams versed and 50 micrograms fentanyl were administered intravenously. The patient's level of consciousness and vital signs were monitored continuously by radiology nursing throughout the procedure under my direct supervision. Total monitored sedation time: 15 minutes FLUOROSCOPY TIME:  None COMPLICATIONS: None immediate. Estimated blood loss: <25 mL PROCEDURE: Informed written consent was obtained from the patient after a thorough discussion of the procedural risks, benefits and alternatives. All questions were addressed. Maximal Sterile Barrier Technique was utilized including caps, mask, sterile gowns, sterile gloves, sterile drape, hand hygiene and skin antiseptic. A timeout was performed prior to the initiation of the procedure. The patient was positioned prone and non-contrast localization CT was performed of the pelvis to demonstrate the iliac marrow spaces. Maximal barrier sterile technique utilized including caps, mask, sterile gowns, sterile gloves, large sterile drape, hand hygiene, and betadine prep. Under sterile conditions and local anesthesia, an 11 gauge coaxial bone biopsy needle was advanced into  the right iliac marrow space. Needle position was confirmed with CT imaging. Initially, bone marrow aspiration was performed. Next, the 11 gauge outer cannula was utilized to obtain a right iliac bone marrow core biopsy. Needle was removed. Hemostasis was obtained with compression. The patient tolerated the procedure well. Samples were prepared with the cytotechnologist. IMPRESSION: Successful CT-guided bone marrow biopsy. Electronically Signed   By: Jacqulynn Cadet M.D.   On: 06/19/2015 17:34   Dg Chest Port 1 View  06/13/2015  CLINICAL DATA:  Status post dialysis catheter placement EXAM: PORTABLE CHEST 1 VIEW COMPARISON:  03/29/2015 FINDINGS: Cardiac shadow is mildly enlarged. A dialysis catheter is now noted in satisfactory position. No pneumothorax is noted. The lungs are clear. IMPRESSION: No complicating factors following dialysis catheter placement. Electronically Signed   By: Inez Catalina M.D.   On: 06/13/2015 12:51   Dg Fluoro Guide Cv Line-no Report  06/13/2015  CLINICAL DATA:  FLOURO GUIDE CV LINE Fluoroscopy was utilized by the requesting  physician.  No radiographic interpretation.    Admission HPI: [name] [name] is a 70yo with stage 4 CKD, CHF (EF 50% May 2016) Atrial flutter, HTN, T2DM previously on insulin s/p R BKA, CAD (RCA DES in 2006), HCV (untreated, normal RUQ [place]) who presents with nausea and vomiting as well as weakness and generalized malaise, particularly worse over the past 2 weeks but gradually worsening over the past 6 months, since his cholecystectomy in September 2016. He says the N/V occurs primarily after eating, but is not associated with stomach pain, fullness, or other symptoms. He denies fevers, chest pain, palpitations, cough, shortness of breath, abdominal pain/distension, urinary symptoms or other issues at this time.  He has been seen twice in our clinic in the past week, first on 3/10 and again today for worsening uremia that is likely causing his symptoms, as he  has initially been resistant to starting dialysis. However, he was agreeable to start today and was admitted from our clinic.  Hospital Course by problem list: End stage renal disease newly on hemodialysis: Admitted for anorexia, malaise, n/v due to uremia from his renal disease. BUN of 114 on admission. Temporary RIJ HD catheter was placed. These symptoms improved greatly with hemodialysis and after 2 sessions he was again tolerating a solid diet. He did not develops any severe symptoms or hemodynamic instability on dialysis. Vein mapping was performed and evaluation by vascular surgery for AVF formation but deferred at this time for his thrombocytopenia. RIJ tunneled cath in place and discharged on a TTS HD schedule.  Thrombocytpenia: Acute on chronic isolated thrombocytopenia. Platelets decreased to a nadir of 16,000. HIT workup was negative on confirmatory testing with SRA. Peripheral blood smears demonstrated no abnormal morphology or evidence of hemolysis. 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 He has had mild thrombocytopenia in the past year and was decreased to 60s during hospitalization in March 2016. Bone marrow biopsy was obtained to rule out malignancy or myleopdysplastic disorder and showed appropriate elevated megakaryocytes.  Paroxysmal atrial flutter: Eliquis held on admission in anticipation of arteriovenous fistula access placement. Placement was deferred however due to thrombocytopenia. Instead his RIJ catheter was exchanged for tunneled IJ. His platelets improved partially and was started on 2.'5mg'$  coumadin daily prior to discharge for follow up with [facility] clinic. Low dose selected due to advanced age, concurrent amiodarone use, and relatively high bleeding risk.  Labs / imaging needed at time of follow-up: CBC  Discharge Vitals:   BP 126/77 mmHg  Pulse 90  Temp(Src) 99.2 F (37.3 C) (Oral)  Resp 24  Ht '5\' 8"'$  (1.727 m)  Wt  68.2 kg (150 lb 5.7 oz)  BMI 22.87 kg/m2  SpO2 100%  Discharge Labs:  No results found for this or any previous visit (from the past 24 hour(s)).  Signed: Collier Salina, MD 06/23/2015, 8:27 PM

## 2015-06-19 NOTE — Sedation Documentation (Signed)
Patient denies pain and is resting comfortably.  

## 2015-06-19 NOTE — Progress Notes (Signed)
Internal Medicine Attending:   I saw and examined the patient. I reviewed the resident's note and I agree with the resident's findings and plan as documented in the resident's note.  70 year old man admitted for new start hemodialysis which was complicated by thrombocytopenia with a platelet nadir of 16,000. Today it is up to 68,000. 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 He underwent a bone marrow biopsy today to rule out myelodysplasia and he will follow-up with Dr. Marin Olp in the hematology clinic. I agree it is important to treat hepatitis C at this point. We will arrange for him to follow-up with hepatitis C clinic as well. Please send a hepatitis C genotype before discharge. Apparently is only one agent approved for the management of hepatitis C in end-stage renal disease patients, but it is only effective if he happens to have genotype 1 or 4. If he has genotype 2 or 3, he will have no current treatment options.  Anticoagulation strategy is a tricky situation here. He is at higher bleeding risk because of potential for future fluctuations in platelet count due to ITP. He has a moderate risk of stroke from chronic A Fib, CHADS-Vasc score of 5 conveying a 6% annual stroke risk. Our options are to 1. Start coumadin and follow him very closely in our coumadin clinic or, 2. Delay anticoagulation for weeks/months until his platelet counts show stability and/or he is treated for hepatitis C. I am going to talk to the patient about these options.

## 2015-06-19 NOTE — Progress Notes (Signed)
   Subjective: Continues to feel well this morning. He is awaiting bone marrow biopsy this morning. Planned for inpatient hemodialysis today then should be able to follow up with his outpatient center by Thursday. He is eager to leave.  Objective: Vital signs in last 24 hours: Filed Vitals:   06/18/15 0900 06/18/15 1734 06/18/15 2029 06/19/15 0419  BP: 120/75 130/80 135/78 124/87  Pulse: 76 77 75 86  Temp: 98.9 F (37.2 C) 99.1 F (37.3 C) 98.7 F (37.1 C) 99.6 F (37.6 C)  TempSrc: Oral Oral Oral Oral  Resp: '18 18 20 18  '$ Height:      Weight:   70 kg (154 lb 5.2 oz)   SpO2: 100% 100% 100% 99%   Weight change: 1.8 kg (3 lb 15.5 oz)  Intake/Output Summary (Last 24 hours) at 06/19/15 1829 Last data filed at 06/19/15 0700  Gross per 24 hour  Intake   1020 ml  Output    650 ml  Net    370 ml   Physical Exam:  GENERAL- alert, co-operative, NAD HEENT- RIJ tunneled HD cath w/ dry intact dressing EXTREMITIES- No edema. RLE BKA. SKIN- Warm, dry, No rash or lesion. PSYCH- Normal mood and affect, appropriate thought content and speech.  CBC:  Recent Labs  06/18/15 0800 06/19/15 0710  WBC 6.6 6.2  HGB 10.0* 9.4*  HCT 31.3* 29.4*  MCV 69.2* 69.2*  PLT 54* PENDING   Medications: I have reviewed the patient's current medications. Scheduled Meds: . amiodarone  200 mg Oral Daily  . famotidine  20 mg Oral Daily  . feeding supplement (NEPRO CARB STEADY)  237 mL Oral Q1500  . ferric gluconate (FERRLECIT/NULECIT) IV  250 mg Intravenous Q T,Th,Sa-HD  . insulin aspart  0-9 Units Subcutaneous TID WC  . levothyroxine  50 mcg Oral QAC breakfast  . lidocaine      . multivitamin  1 tablet Oral QHS   Continuous Infusions:  PRN Meds:.acetaminophen, docusate sodium, fluticasone, ondansetron   Assessment/Plan: Acute on Chronic Thrombocytopenia: No signs of bleeding/thrombosis. Platelet count still pending today. 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 Bone marrow biopsy is scheduled for today to rule out underlying cancer or dysplastic syndrome. - CBC as follows:   Recent Labs Lab 06/18/15 0648 06/18/15 0800 06/19/15 0710  HGB 10.4* 10.0* 9.4*  HCT 31.6* 31.3* 29.4*  WBC 6.9 6.6 6.2  PLT 50* 54* PENDING  -Heme/onc consulted, recommendations appreciated, will follow up outpatient in clinic -Will need rescheduling of Coumadin clinic appointment -[facility] clinic appointment 06/29/15 -repeat CBC tomorrow am  ESRD TTHS HD: Hopefully today last inpatient HD then will F/U at center -HD per nephrology, recommendations appreciated  Atrial Flutter: Holding for today until at least after bone biopsy. -Holding coumadin today  Chronic Combined CHF: euvolemic  -Consider resuming low dose BB in outpatient clinic if HR can tolerate -Now ESRD, could use ACEI  Diet: Carb mod/renal VTE ppx: SCDs  FULL CODE  Dispo: Dispo deferred until further workup of thrombocytopenia.  The patient does have a current PCP Oval Linsey, MD).   LOS: 11 days   Collier Salina, MD 06/19/2015, 9:37 AM

## 2015-06-20 LAB — SEROTONIN RELEASE ASSAY (SRA): SRA, LOW DOSE HEPARIN: 1 % (ref 0–20)

## 2015-06-22 ENCOUNTER — Encounter (HOSPITAL_COMMUNITY)
Admission: RE | Admit: 2015-06-22 | Discharge: 2015-06-22 | Disposition: A | Discharge status: Home / Self Care | Payer: Medicare Other | Source: Ambulatory Visit | Attending: Internal Medicine | Admitting: Internal Medicine

## 2015-06-22 ENCOUNTER — Ambulatory Visit (INDEPENDENT_AMBULATORY_CARE_PROVIDER_SITE_OTHER): Payer: Medicare Other | Admitting: Pharmacist

## 2015-06-22 DIAGNOSIS — N184 Chronic kidney disease, stage 4 (severe): Secondary | ICD-10-CM | POA: Diagnosis not present

## 2015-06-22 DIAGNOSIS — I4892 Unspecified atrial flutter: Secondary | ICD-10-CM | POA: Diagnosis not present

## 2015-06-22 DIAGNOSIS — D631 Anemia in chronic kidney disease: Secondary | ICD-10-CM | POA: Diagnosis present

## 2015-06-22 DIAGNOSIS — Z7901 Long term (current) use of anticoagulants: Secondary | ICD-10-CM | POA: Diagnosis not present

## 2015-06-22 LAB — POCT HEMOGLOBIN-HEMACUE: HEMOGLOBIN: 10.1 g/dL — AB (ref 13.0–17.0)

## 2015-06-22 LAB — POCT INR: INR: 1.2

## 2015-06-22 MED ORDER — DARBEPOETIN ALFA 100 MCG/0.5ML IJ SOSY
PREFILLED_SYRINGE | INTRAMUSCULAR | Status: AC
Start: 2015-06-22 — End: 2015-06-22
  Administered 2015-06-22: 100 ug via SUBCUTANEOUS
  Filled 2015-06-22: qty 0.5

## 2015-06-22 MED ORDER — DARBEPOETIN ALFA 100 MCG/0.5ML IJ SOSY
100.0000 ug | PREFILLED_SYRINGE | INTRAMUSCULAR | Status: DC
  Administered 2015-06-22: 100 ug via SUBCUTANEOUS

## 2015-06-22 NOTE — Telephone Encounter (Signed)
[facility]  [facility]

## 2015-06-22 NOTE — Progress Notes (Signed)
Anticoagulation Management [name] [name] is a 70 y.o. male who reports to the clinic for monitoring of warfarin treatment.    Indication: atrial fibrillation Duration: indefinite  Anticoagulation Clinic Visit History: Reports some bleeding when urinating this morning   Anticoagulation Episode Summary    Current INR goal 2.0-3.0   Next INR check 06/25/2015   INR from last check 1.2! (06/22/2015)   Weekly max dose    Target end date    INR check location    Preferred lab    Send INR reminders to       Comments        ASSESSMENT Recent Results:  New warfarin patient. The most recent result is correlated with 17.5mg  mg per week. Pt received warfarin 2.5mg  x1 at discharge on 3/27. Reports he was not able to pick up his warfarin Rx from the pharmacy until 3/30; of which he has taken yesterday's and today's dose. Presents today with a subtherapeutic INR. Of note, pt reports some blood in his urine this morning which he claims is new.  Lab Results  Component Value Date   INR 1.2 06/22/2015   INR 1.16 06/19/2015   INR 1.26 06/18/2015    Anticoagulation Dosing: INR as of 06/22/2015 and Previous Dosing Information    INR Dt INR Goal Jacki ConesWkly Tot Sun Mon Tue Wed Thu Fri Sat   06/22/2015 1.2 -            Anticoagulation Dose Instructions as of 06/22/2015      Total Sun Mon Tue Wed Thu Fri Sat   New Dose 17.5 mg 2.5 mg 2.5 mg 2.5 mg 2.5 mg 2.5 mg 2.5 mg 2.5 mg     (2.5 mg x 1)  (2.5 mg x 1)  (2.5 mg x 1)  (2.5 mg x 1)  (2.5 mg x 1)  (2.5 mg x 1)  (2.5 mg x 1)                           INR today: Subtherapeutic  PLAN Weekly dose was unchanged due to warfarin being restarted a day prior to today's visit. Side effects, drug/diet interactions, and management of adverse events explained to patient. Pt instructed to call clinic TODAY if he experiences any further evidence of blood in his urine.   Patient Instructions  DoseResponse handout given  Patient advised to contact clinic  or seek medical attention if signs/symptoms of bleeding or thromboembolism occur.  Patient verbalized understanding by repeating back information and was advised to contact me if further medication-related questions arise. Patient was also provided an information handout.  Follow-up 06/25/15  Reuel Derbyobert J Vincent, PharmD  25 minutes spent face-to-face with the patient during the encounter. 80% of time spent on education. 20% of time was spent on assessment and plan.

## 2015-06-22 NOTE — Patient Instructions (Signed)
DoseResponse handout given 

## 2015-06-22 NOTE — Progress Notes (Signed)
Patient was seen in clinic by Rob Vincent, PharmD, PGY1 pharmacy resident. I agree with the assessment and plan of care documented.  

## 2015-06-25 ENCOUNTER — Ambulatory Visit (INDEPENDENT_AMBULATORY_CARE_PROVIDER_SITE_OTHER): Payer: Medicare Other | Admitting: Pharmacist

## 2015-06-25 DIAGNOSIS — I4891 Unspecified atrial fibrillation: Secondary | ICD-10-CM | POA: Diagnosis not present

## 2015-06-25 DIAGNOSIS — Z7901 Long term (current) use of anticoagulants: Secondary | ICD-10-CM | POA: Diagnosis not present

## 2015-06-25 LAB — POCT INR: INR: 1.7

## 2015-06-25 NOTE — Patient Instructions (Signed)
Patient instructed to take medications as defined in the Anti-coagulation Track section of this encounter.  Patient instructed to take today's dose.  Patient verbalized understanding of these instructions.    

## 2015-06-25 NOTE — Telephone Encounter (Signed)
Spoke w/ mr and mrs Blakeman this am, they stated they are doing well, changed the appt for them to 4/10 as it fits their schedule better

## 2015-06-25 NOTE — Progress Notes (Signed)
Anti-Coagulation Progress Note  [name] [name] is a 70 y.o. male who is currently on an anti-coagulation regimen.    RECENT RESULTS: Recent results are below, the most recent result is correlated with a dose of 17.5 mg. per week: Lab Results  Component Value Date   INR 1.70 06/25/2015   INR 1.2 06/22/2015   INR 1.16 06/19/2015    ANTI-COAG DOSE: Anticoagulation Dose Instructions as of 06/25/2015      [name] SmilesSun Mon Tue Wed Thu Fri Sat   New Dose 2.5 mg 2.5 mg 3.75 mg 2.5 mg 3.75 mg 2.5 mg 3.75 mg       ANTICOAG SUMMARY: Anticoagulation Episode Summary    Current INR goal 2.0-3.0   Next INR check 07/02/2015   INR from last check 1.70! (06/25/2015)   Weekly max dose    Target end date    INR check location    Preferred lab    Send INR reminders to       Comments         ANTICOAG TODAY: Anticoagulation Summary as of 06/25/2015    INR goal 2.0-3.0   Selected INR 1.70! (06/25/2015)   Next INR check 07/02/2015   Target end date     Anticoagulation Episode Summary    INR check location    Preferred lab    Send INR reminders to    Comments       PATIENT INSTRUCTIONS: Patient Instructions  Patient instructed to take medications as defined in the Anti-coagulation Track section of this encounter.  Patient instructed to take today's dose.  Patient verbalized understanding of these instructions.       FOLLOW-UP Return in 7 days (on 07/02/2015) for Follow up INR at 10145h.  [name] [name], III Pharm.D., CACP

## 2015-06-26 NOTE — Progress Notes (Signed)
INTERNAL MEDICINE TEACHING ATTENDING ADDENDUM - Jeriann Sayres M.D  Duration- indefinite, Indication- atrial fibrillation, INR- subtherapeutic. Agree with pharmacy recommendations as outlined in their note.   

## 2015-06-28 ENCOUNTER — Encounter: Payer: Self-pay | Admitting: Internal Medicine

## 2015-06-29 ENCOUNTER — Ambulatory Visit: Payer: Medicare Other | Admitting: Internal Medicine

## 2015-06-29 LAB — CHROMOSOME ANALYSIS, BONE MARROW

## 2015-07-02 ENCOUNTER — Ambulatory Visit (INDEPENDENT_AMBULATORY_CARE_PROVIDER_SITE_OTHER): Payer: Medicare Other | Admitting: Internal Medicine

## 2015-07-02 ENCOUNTER — Ambulatory Visit (INDEPENDENT_AMBULATORY_CARE_PROVIDER_SITE_OTHER): Payer: Medicare Other | Admitting: Pharmacist

## 2015-07-02 ENCOUNTER — Encounter: Payer: Self-pay | Admitting: Internal Medicine

## 2015-07-02 VITALS — BP 105/66 | HR 53 | Temp 97.8°F | Wt 163.7 lb

## 2015-07-02 DIAGNOSIS — I4892 Unspecified atrial flutter: Secondary | ICD-10-CM | POA: Diagnosis not present

## 2015-07-02 DIAGNOSIS — E43 Unspecified severe protein-calorie malnutrition: Secondary | ICD-10-CM | POA: Diagnosis not present

## 2015-07-02 DIAGNOSIS — D696 Thrombocytopenia, unspecified: Secondary | ICD-10-CM | POA: Diagnosis not present

## 2015-07-02 DIAGNOSIS — Z7901 Long term (current) use of anticoagulants: Secondary | ICD-10-CM | POA: Diagnosis not present

## 2015-07-02 DIAGNOSIS — N186 End stage renal disease: Secondary | ICD-10-CM | POA: Diagnosis not present

## 2015-07-02 DIAGNOSIS — B182 Chronic viral hepatitis C: Secondary | ICD-10-CM

## 2015-07-02 DIAGNOSIS — Z992 Dependence on renal dialysis: Secondary | ICD-10-CM | POA: Diagnosis not present

## 2015-07-02 LAB — POCT INR: INR: 2.6

## 2015-07-02 NOTE — Patient Instructions (Signed)
Today we checked your blood counts to see where your coumadin dose and platelet counts are at. This may need to be checked regularly over the next few weeks before we get to a more stable level and can decrease visit frequency.  We will send a referral to the hepatitis C clinic to evaluate if and which treatment is needed if your platelet count remains low. They will contact you to arrange an appointment.  Continue trying to keep up a good dietary intake. Nutritional supplements can also help and if you have trouble obtaining these feel free to call the clinic or have a pharmacy do so.  Transitioning to dialysis is tough and you are doing a very good job adjusting so far!

## 2015-07-02 NOTE — Patient Instructions (Signed)
Patient instructed to take medications as defined in the Anti-coagulation Track section of this encounter.  Patient instructed to take today's dose.  Patient verbalized understanding of these instructions.    

## 2015-07-02 NOTE — Progress Notes (Signed)
Subjective:   Patient ID: [name] [name] male   DOB: 1946-02-24 70 y.o.   MRN: [id number]  HPI: [name] is a 70 y.o. man with PMhx detailed below presenting for hospital follow up after starting hemodialysis for end stage renal disease. Since discharge he has been attending Tuesday Thursday Saturday dialysis with transportation by his wife. He had one episode of passing out from too much volume removal otherwise tolerates the sessions well besides having some fatigue afterwards.  See problem based assessment and plan below for additional details.  Past Medical History  Diagnosis Date  . Hyperlipidemia 04/02/2006  . Essential hypertension 12/09/2006  . Benign prostatic hypertrophy with nocturia 07/07/2006  . Chronic venous insufficiency 04/12/2010  . Obesity (BMI 30.0-34.9) 01/15/2012  . Constipation 05/25/2009    Intermittent   . Microcytic normochromic anemia 05/27/2006  . Internal and external hemorrhoids without complication 08/20/2012  . Coronary artery disease 04/02/2006    s/p RCA DES 2006, low risk myoview in 2011  . 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 Type 2 diabetes mellitus with peripheral artery disease (HCC) 05/27/2006    Absent pulses in the left foot   . Closed displaced fracture of left femoral neck (HCC) 06/14/2014    s/p left hip hemiarthroplasty June 14, 2014   . Atrial flutter (HCC) 07/26/2014    a. May 2016, CHA2DS2VASc = 4 -> Eliquis, spontaneous conversion to NSR;  b. On amio;  c. 01/2015 EPS: Unable to induce right sided Aflutter. Non-sustained Afib and Left sided Aflutter noted.  [name] GERD (gastroesophageal reflux disease)   . 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 History of blood  transfusion 05/2014    "related to hip OR"  . Degenerative joint disease involving multiple joints 02/02/2007  . Arthritis     "fingers" (02/07/2015)  . End stage renal disease on dialysis [facility])    Current Outpatient Prescriptions  Medication Sig Dispense Refill  . amiodarone (PACERONE) 200 MG tablet Take 1 tablet (200 mg total) by mouth daily. 90 tablet 3  . calcium citrate-vitamin D (CITRACAL+D) 315-200 MG-UNIT per tablet Take 1 tablet by mouth daily.     . carvedilol (COREG) 12.5 MG tablet Take 1 tablet (12.5 mg total) by mouth 2 (two) times daily with a meal. 180 tablet 3  . Darbepoetin Alfa (ARANESP) 100 MCG/0.5ML SOSY injection Inject 0.5 mLs (100 mcg total) into the skin every 7 (seven) days. 4.2 mL 8  . docusate sodium (COLACE) 100 MG capsule Take 100 mg by mouth at bedtime as needed for moderate constipation (constipation). Reported on 04/20/2015    . famotidine (PEPCID) 20 MG tablet Take 20 mg before breakfast and 20 mg before supper 60 tablet 6  . fish oil-omega-3 fatty acids 1000 MG capsule Take 1 g by mouth daily.    . fluticasone (FLONASE) 50 MCG/ACT nasal spray Place 1 spray into both nostrils daily as needed for allergies. 16 g 3  . hydroxypropyl methylcellulose (ISOPTO TEARS) 2.5 % ophthalmic solution Place 2 drops into both eyes 4 (four) times daily as needed for dry eyes. Reported on 04/20/2015    . levothyroxine (SYNTHROID, LEVOTHROID) 25 MCG tablet Take 1 tablet (25 mcg total) by mouth daily  before breakfast. 90 tablet 3  . Multiple Vitamins-Minerals (MULTIVITAMIN WITH MINERALS) tablet Take 1 tablet by mouth daily.    . Nutritional Supplements (FEEDING SUPPLEMENT, NEPRO CARB STEADY,) LIQD Take 237 mLs by mouth daily. 30 Can 0  . ondansetron (ZOFRAN) 4 MG tablet Take 1 tablet (4 mg total) by mouth every 8 (eight) hours as needed for nausea or vomiting. 30 tablet 1  . terazosin (HYTRIN) 5 MG capsule Take 1 capsule (5 mg total) by mouth at bedtime. 90 capsule 3  . warfarin  (COUMADIN) 2.5 MG tablet Take 1 tablet (2.5 mg total) by mouth once. 30 tablet 0   No current facility-administered medications for this visit.   Family History  Problem Relation Age of Onset  . Hypertension Mother   . Heart attack Father   . Breast cancer Sister   . Arthritis Sister   . Heart attack Brother   . Diabetes Brother   . Stroke Brother   . Pneumonia Daughter   . Diabetes Brother   . Alcoholism Brother   . Diabetes Brother   . Arthritis Sister     Bilateral knee replacement  . HIV Daughter   . Hypertension Daughter   . Drug abuse Daughter   . Schizophrenia Daughter   . Hypertension Daughter   . Drug abuse Daughter   . Bipolar disorder Daughter    Social History   Social History  . Marital Status: Married    Spouse Name: N/A  . Number of Children: N/A  . Years of Education: N/A   Social History Main Topics  . Smoking status: Former Smoker -- 1.00 packs/day for 10 years    Types: Cigarettes    Quit date: 06/16/1968  . Smokeless tobacco: Never Used  . Alcohol Use: No     Comment: "quit alcohol in the 1960's"  . Drug Use: No  . Sexual Activity: Not Currently    Birth Control/ Protection: None   Other Topics Concern  . None   Social History Narrative   Review of Systems: Review of Systems  Constitutional: Positive for malaise/fatigue.  Eyes: Negative for blurred vision.  Respiratory: Negative for shortness of breath.   Cardiovascular: Negative for leg swelling.  Gastrointestinal: Positive for nausea and vomiting. Negative for diarrhea.  Genitourinary: Negative.   Musculoskeletal: Negative for falls.  Skin: Negative for rash.  Neurological: Negative for dizziness and headaches.  Endo/Heme/Allergies: Does not bruise/bleed easily.  Psychiatric/Behavioral: The patient does not have insomnia.     Objective:  Physical Exam: Filed Vitals:   07/02/15 1423  BP: 105/66  Pulse: 53  Temp: 97.8 F (36.6 C)  TempSrc: Oral  Weight: 163 lb 11.2 oz  (74.254 kg)  SpO2: 100%   GENERAL- alert, co-operative, NAD HEENT- Oral mucosa appears moist, no JVD, RIJ tunneled HD catheter in place with clean dressing CARDIAC- RRR, no murmurs, rubs or gallops. RESP- CTAB, no wheezes or crackles. ABDOMEN- Soft, nontender, no guarding or rebound, normoactive bowel sounds present EXTREMITIES- No LLE edema, thin limbs with some interosseus muscle wasting SKIN- Warm, dry, No rash or lesion. PSYCH- Normal mood and affect, appropriate thought content and speech.  Assessment & Plan:

## 2015-07-02 NOTE — Progress Notes (Signed)
Anti-Coagulation Progress Note  [name] [name] is a 70 y.o. male who is currently on an anti-coagulation regimen.    RECENT RESULTS: Recent results are below, the most recent result is correlated with a dose of 21.25 mg. per week: Lab Results  Component Value Date   INR 2.60 07/02/2015   INR 1.70 06/25/2015   INR 1.2 06/22/2015    ANTI-COAG DOSE: Anticoagulation Dose Instructions as of 07/02/2015      Glynis SmilesSun Mon Tue Wed Thu Fri Sat   New Dose 2.5 mg 2.5 mg 3.75 mg 2.5 mg 3.75 mg 2.5 mg 3.75 mg       ANTICOAG SUMMARY: Anticoagulation Episode Summary    Current INR goal 2.0-3.0   Next INR check 07/09/2015   INR from last check 2.60 (07/02/2015)   Weekly max dose    Target end date    INR check location    Preferred lab    Send INR reminders to       Comments         ANTICOAG TODAY: Anticoagulation Summary as of 07/02/2015    INR goal 2.0-3.0   Selected INR 2.60 (07/02/2015)   Next INR check 07/09/2015   Target end date     Anticoagulation Episode Summary    INR check location    Preferred lab    Send INR reminders to    Comments       PATIENT INSTRUCTIONS: Patient Instructions  Patient instructed to take medications as defined in the Anti-coagulation Track section of this encounter.  Patient instructed to take today's dose.  Patient verbalized understanding of these instructions.     FOLLOW-UP Return in about 7 days (around 07/09/2015) for F/u INR @ 1015.  Hulen LusterJames Yui Mulvaney, III Pharm.D., CACP

## 2015-07-03 ENCOUNTER — Telehealth: Payer: Self-pay | Admitting: Cardiology

## 2015-07-03 ENCOUNTER — Encounter (HOSPITAL_COMMUNITY): Payer: Self-pay

## 2015-07-03 LAB — CBC
Hematocrit: 34.8 % — ABNORMAL LOW (ref 37.5–51.0)
Hemoglobin: 10.4 g/dL — ABNORMAL LOW (ref 12.6–17.7)
MCH: 22.9 pg — ABNORMAL LOW (ref 26.6–33.0)
MCHC: 29.9 g/dL — ABNORMAL LOW (ref 31.5–35.7)
MCV: 77 fL — ABNORMAL LOW (ref 79–97)
Platelets: 144 10*3/uL — ABNORMAL LOW (ref 150–379)
RBC: 4.55 x10E6/uL (ref 4.14–5.80)
RDW: 21.9 % — ABNORMAL HIGH (ref 12.3–15.4)
WBC: 5.5 10*3/uL (ref 3.4–10.8)

## 2015-07-03 NOTE — Assessment & Plan Note (Signed)
Large weight loss with associated peripheral muscle wasting during his long period of uremia and poor intake. He is eating more now with nausea/vomiting resolved. He is still not able to eat a large amount on hemodialysis days. He is also not able to walk well currently as his RLE prosthetic is too loose due to his weight loss. He has not fallen as a result of this instability.  -Ordered nutritional supplement Nepro Carb Steady (?or equivalent) -Patient following up with prosthetics fitting for new RLE wraps

## 2015-07-03 NOTE — Assessment & Plan Note (Signed)
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 Now he shows evidence of ITP during acute stress likely related to his underlying hepatitis C as the cause. He may or may not be a good candidate for eradication based on viral genotype and his ESRD.  -Referral to ID for hepatitis C clinic -Actually due to new hepatitis A vaccination any time, consider on next visit

## 2015-07-03 NOTE — Assessment & Plan Note (Addendum)
Now started on TTS hemodialysis regimen. Primary problem that caused him to start HD was nausea, vomiting, and anorexia from his uremia. He is now tolerating treatments well but has significant fatigue afterwards. He is not able to eat a lot in the morning prior to dialysis sessions due to nausea and afterwards due to fatigue. On the other days he is feeling well. Right now his wife is providing transportation bu he plans to use public transportation at least sometimes in the future. He is still using a tunneled RIJ for access pending [facility] follow up, probably can undergo this soon with his improving platelet count.  -Sees [facility] on 4/19 for follow up assessment for eventual access placement -Discontinued most antihypertensives now with volume controlled, now only coreg -Continue HD at center per outpatient nephrology -Consider residual output/if he will continue to require medical management of his BPH now on ESRD

## 2015-07-03 NOTE — Assessment & Plan Note (Signed)
CBC:  Recent Labs  07/02/15 1523  WBC 5.5  HCT 34.8*  MCV 77*  PLT 144*   Thrombocytopenia to a nadir of 16,000 platelets during his recent hospitalization. Workup at that time was unremarkable with no symptoms and no signs of significant hemolysis. Bone marrow biopsy was performed to rule out malignancy or myelodysplastic process and appeared normal on surgical path report. Genetic testing result was performed but I have not received or reviewed it at this time. 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 Resolution of this problem is important to allow permanent AVF formation for his dialysis in the future. Platelets improved to 144 today from 60 prior to discharge.  -No immunosuppressive treatments needed, improving spontaneously and asymptomatic -Referral to ID clinic for possible hepatitis C eradication -Repeat CBC at clinic follow up

## 2015-07-03 NOTE — Telephone Encounter (Signed)
Received records from [facility] for appointment with Dr Jens Somrenshaw on 07/19/15.  [facility] for Dr Ludwig Clarksrenshaw's schedule on 07/19/15. lp

## 2015-07-03 NOTE — Progress Notes (Signed)
I saw and evaluated the patient.  I personally confirmed the key portions of Dr. Rice's history and exam and reviewed pertinent patient test results.  The assessment, diagnosis, and plan were formulated together and I agree with the documentation in the resident's note. 

## 2015-07-03 NOTE — Progress Notes (Signed)
Indication: Paroxysmal atrial fibrillation. Duration: Indefinite. INR: At target. Agree with Dr. Groce's assessment and plan. 

## 2015-07-04 ENCOUNTER — Encounter: Payer: Self-pay | Admitting: Vascular Surgery

## 2015-07-09 ENCOUNTER — Ambulatory Visit (INDEPENDENT_AMBULATORY_CARE_PROVIDER_SITE_OTHER): Payer: Medicare Other | Admitting: Pharmacist

## 2015-07-09 DIAGNOSIS — Z7901 Long term (current) use of anticoagulants: Secondary | ICD-10-CM | POA: Diagnosis not present

## 2015-07-09 DIAGNOSIS — I480 Paroxysmal atrial fibrillation: Secondary | ICD-10-CM | POA: Diagnosis not present

## 2015-07-09 LAB — POCT INR: INR: 2.6

## 2015-07-09 NOTE — Patient Instructions (Signed)
Patient instructed to take medications as defined in the Anti-coagulation Track section of this encounter.  Patient instructed to take today's dose.  Patient verbalized understanding of these instructions.    

## 2015-07-09 NOTE — Progress Notes (Signed)
Indication: Paroxysmal atrial fibrillation. Duration: Indefinite. INR: At target. Agree with Dr. Groce's assessment and plan. 

## 2015-07-09 NOTE — Progress Notes (Signed)
Anti-Coagulation Progress Note  [name] [name] is a 70 y.o. male who is currently on an anti-coagulation regimen.    RECENT RESULTS: Recent results are below, the most recent result is correlated with a dose of 21.25 mg. per week: Lab Results  Component Value Date   INR 2.60 07/09/2015   INR 2.60 07/02/2015   INR 1.70 06/25/2015    ANTI-COAG DOSE: Anticoagulation Dose Instructions as of 07/09/2015      Glynis SmilesSun Mon Tue Wed Thu Fri Sat   New Dose 2.5 mg 2.5 mg 3.75 mg 2.5 mg 3.75 mg 2.5 mg 3.75 mg       ANTICOAG SUMMARY: Anticoagulation Episode Summary    Current INR goal 2.0-3.0   Next INR check 07/30/2015   INR from last check 2.60 (07/09/2015)   Weekly max dose    Target end date    INR check location    Preferred lab    Send INR reminders to       Comments         ANTICOAG TODAY: Anticoagulation Summary as of 07/09/2015    INR goal 2.0-3.0   Selected INR 2.60 (07/09/2015)   Next INR check 07/30/2015   Target end date     Anticoagulation Episode Summary    INR check location    Preferred lab    Send INR reminders to    Comments       PATIENT INSTRUCTIONS: Patient Instructions  Patient instructed to take medications as defined in the Anti-coagulation Track section of this encounter.  Patient instructed to take today's dose.  Patient verbalized understanding of these instructions.       FOLLOW-UP Return in 3 weeks (on 07/30/2015) for Follow up INR at 1015h.  Hulen LusterJames Groce, III Pharm.D., CACP

## 2015-07-09 NOTE — Progress Notes (Signed)
Patient instructed to take medications as defined in the Anti-coagulation Track section of this encounter.  Patient instructed to take today's dose.  Patient verbalized understanding of these instructions.    

## 2015-07-11 ENCOUNTER — Ambulatory Visit (INDEPENDENT_AMBULATORY_CARE_PROVIDER_SITE_OTHER): Payer: Medicare Other | Admitting: Vascular Surgery

## 2015-07-11 ENCOUNTER — Encounter: Payer: Self-pay | Admitting: Vascular Surgery

## 2015-07-11 ENCOUNTER — Telehealth: Payer: Self-pay | Admitting: Pharmacist

## 2015-07-11 VITALS — BP 136/78 | HR 53 | Ht 68.0 in | Wt 163.0 lb

## 2015-07-11 DIAGNOSIS — Z992 Dependence on renal dialysis: Secondary | ICD-10-CM

## 2015-07-11 DIAGNOSIS — N186 End stage renal disease: Secondary | ICD-10-CM

## 2015-07-11 MED ORDER — WARFARIN SODIUM 2.5 MG PO TABS: ORAL_TABLET | ORAL | Status: DC

## 2015-07-11 NOTE — Progress Notes (Signed)
Established Dialysis Access  History of Present Illness  [name] [name] is a 70 y.o. (1945-07-21) male who presents for re-evaluation for permanent access.  The patient is right hand dominant.  During his recent admission, he had thrombocytopenia so his access placement was delayed.  The patient's complication from previous access procedures include: RIJV TDC.  The patient has never had a previous PPM placed.  Past Medical History  Diagnosis Date  . Hyperlipidemia 04/02/2006  . Essential hypertension 12/09/2006  . Benign prostatic hypertrophy with nocturia 07/07/2006  . Chronic venous insufficiency 04/12/2010  . Obesity (BMI 30.0-34.9) 01/15/2012  . Constipation 05/25/2009    Intermittent   . Microcytic normochromic anemia 05/27/2006  . Internal and external hemorrhoids without complication 08/20/2012  . Coronary artery disease 04/02/2006    s/p RCA DES 2006, low risk myoview in 2011  . 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 Type 2 diabetes mellitus with peripheral artery disease (HCC) 05/27/2006    Absent pulses in the left foot   . Closed displaced fracture of left femoral neck (HCC) 06/14/2014    s/p left hip hemiarthroplasty June 14, 2014   . Atrial flutter (HCC) 07/26/2014    a. May 2016, CHA2DS2VASc = 4 -> Eliquis, spontaneous conversion to NSR;  b. On amio;  c. 01/2015 EPS: Unable to induce right sided Aflutter. Non-sustained Afib and Left sided Aflutter noted.  [name] GERD (gastroesophageal reflux disease)   . 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 History of blood transfusion 05/2014    "related to hip OR"  . Degenerative joint disease involving multiple joints 02/02/2007  . Arthritis     "fingers" (02/07/2015)    . End stage renal disease on dialysis [facility])     Past Surgical History  Procedure Laterality Date  . Pilonidal cyst excision  1990's  . Refractive surgery Bilateral   . Fracture surgery    . Total hip arthroplasty Left 06/14/2014    Procedure: HEMI HIP ARTHROPLASTY ANTERIOR APPROACH;  Surgeon: Tarry Kos, MD;  Location: [facility] OR;  Service: Orthopedics;  Laterality: Left;  . Joint replacement    . Leg amputation above knee Right ~ 2008  . Cholecystectomy N/A 12/12/2014    Procedure: LAPAROSCOPIC CHOLECYSTECTOMY WITH INTRAOPERATIVE CHOLANGIOGRAM;  Surgeon: Gaynelle Adu, MD;  Location: [facility] OR;  Service: General;  Laterality: N/A;  . Electrophysiologic study N/A 02/07/2015    Procedure: A-Flutter Ablation;  Surgeon: Marinus Maw, MD;  Location: [facility] INVASIVE CV LAB;  Service: Cardiovascular;  Laterality: N/A;  . Tonsillectomy    . Coronary angioplasty with stent placement    . Prostate biopsy  ~ 2013  . Insertion of dialysis catheter Right 06/13/2015    Procedure: INSERTION OF DIALYSIS CATHETER RIGHT INTERNAL JUGULAR;  Surgeon: Larina Earthly, MD;  Location: [facility] OR;  Service: Vascular;  Laterality: Right;  . Removal of a dialysis catheter Right 06/13/2015    Procedure: REMOVAL OF A DIALYSIS CATHETER;  Surgeon: Larina Earthly, MD;  Location: [facility] OR;  Service: Vascular;  Laterality: Right;    Social History   Social History  . Marital Status: Married    Spouse Name: N/A  . Number of Children: N/A  . Years of  Education: N/A   Occupational History  . Not on file.   Social History Main Topics  . Smoking status: Former Smoker -- 1.00 packs/day for 10 years    Types: Cigarettes    Quit date: 06/16/1968  . Smokeless tobacco: Never Used  . Alcohol Use: No     Comment: "quit alcohol in the 1960's"  . Drug Use: No  . Sexual Activity: Not Currently    Birth Control/ Protection: None   Other Topics Concern  . Not on file   Social History Narrative    Family History  Problem Relation Age of  Onset  . Hypertension Mother   . Heart attack Father   . Breast cancer Sister   . Arthritis Sister   . Heart attack Brother   . Diabetes Brother   . Stroke Brother   . Pneumonia Daughter   . Diabetes Brother   . Alcoholism Brother   . Diabetes Brother   . Arthritis Sister     Bilateral knee replacement  . HIV Daughter   . Hypertension Daughter   . Drug abuse Daughter   . Schizophrenia Daughter   . Hypertension Daughter   . Drug abuse Daughter   . Bipolar disorder Daughter      Current Outpatient Prescriptions  Medication Sig Dispense Refill  . amiodarone (PACERONE) 200 MG tablet Take 1 tablet (200 mg total) by mouth daily. 90 tablet 3  . calcium citrate-vitamin D (CITRACAL+D) 315-200 MG-UNIT per tablet Take 1 tablet by mouth daily.     . carvedilol (COREG) 12.5 MG tablet Take 1 tablet (12.5 mg total) by mouth 2 (two) times daily with a meal. 180 tablet 3  . Darbepoetin Alfa (ARANESP) 100 MCG/0.5ML SOSY injection Inject 0.5 mLs (100 mcg total) into the skin every 7 (seven) days. 4.2 mL 8  . docusate sodium (COLACE) 100 MG capsule Take 100 mg by mouth at bedtime as needed for moderate constipation (constipation). Reported on 04/20/2015    . famotidine (PEPCID) 20 MG tablet Take 20 mg before breakfast and 20 mg before supper 60 tablet 6  . fish oil-omega-3 fatty acids 1000 MG capsule Take 1 g by mouth daily.    . fluticasone (FLONASE) 50 MCG/ACT nasal spray Place 1 spray into both nostrils daily as needed for allergies. 16 g 3  . hydroxypropyl methylcellulose (ISOPTO TEARS) 2.5 % ophthalmic solution Place 2 drops into both eyes 4 (four) times daily as needed for dry eyes. Reported on 04/20/2015    . levothyroxine (SYNTHROID, LEVOTHROID) 25 MCG tablet Take 1 tablet (25 mcg total) by mouth daily before breakfast. 90 tablet 3  . Multiple Vitamins-Minerals (MULTIVITAMIN WITH MINERALS) tablet Take 1 tablet by mouth daily.    . Nutritional Supplements (FEEDING SUPPLEMENT, NEPRO CARB  STEADY,) LIQD Take 237 mLs by mouth daily. 30 Can 0  . ondansetron (ZOFRAN) 4 MG tablet Take 1 tablet (4 mg total) by mouth every 8 (eight) hours as needed for nausea or vomiting. 30 tablet 1  . terazosin (HYTRIN) 5 MG capsule Take 1 capsule (5 mg total) by mouth at bedtime. 90 capsule 3  . warfarin (COUMADIN) 2.5 MG tablet Take 1&1/2 tablets on Tuesdays/Thursdays/Saturdays; all other days, take 1 tablet. 40 tablet 2   No current facility-administered medications for this visit.     Allergies  Allergen Reactions  . Amoxicillin Swelling and Other (See Comments)    Puffy eyes and abdominal pain with Amoxicillin PO  . Lisinopril Other (See Comments)  Acute kidney injury  . Pravastatin Other (See Comments)    Weakness and fatigue  . Tamsulosin Other (See Comments)    REACTION: dry throat, sweating, blurred vision  . Doxycycline Rash and Other (See Comments)    Questionable drug rxn rash     REVIEW OF SYSTEMS:  (Positives checked otherwise negative)  CARDIOVASCULAR:    chest pain,   chest pressure,   palpitations,   shortness of breath when laying flat,   shortness of breath with exertion,    pain in feet when walking,   pain in feet when laying flat,  history of blood clot in veins (DVT),   history of phlebitis,   swelling in legs,   varicose veins  PULMONARY:    productive cough,   asthma,   wheezing  NEUROLOGIC:    weakness in arms or legs,   numbness in arms or legs,   difficulty speaking or slurred speech,   temporary loss of vision in one eye,   dizziness  HEMATOLOGIC:    bleeding problems,   problems with blood clotting too easily  MUSCULOSKEL:    joint pain,  joint swelling  GASTROINTEST:    vomiting blood,   blood in stool     GENITOURINARY:    burning with urination,   blood in urine  ESRD-t/r/s  PSYCHIATRIC:    history of major depression  INTEGUMENTARY:     rashes,   ulcers  CONSTITUTIONAL:    fever,   chills    Physical Examination  Filed Vitals:   07/11/15 1333  BP: 136/78  Pulse: 53  Height:  (1.727 m)  Weight: 163 lb (73.936 kg)  SpO2: 98%   Body mass index is 24.79 kg/(m^2).  General: A&O x 3, WD, wheelchair bound  Pulmonary: Sym exp, good air movt, CTAB, no rales, rhonchi, & wheezing  Cardiac: RRR, Nl S1, S2, no Murmurs, rubs or gallops  Vascular: Vessel Right Left  Radial Palpable Palpable  Ulnar Not Palpable Not Palpable  Brachial Palpable Palpable   Gastrointestinal: soft, NTND, no G/R, bo HSM, no masses, no CVAT B  Musculoskeletal: M/S 5/5 throughout , Extremities without  ischemic changes  Neurologic: Pain and light touch intact in extremities , Motor exam as listed above   Medical Decision Making  MACARIO SHEAR is a 70 y.o. male who presents with ESRD requiring hemodialysis.    Based on on prior vein mapping and examination, this patient's initial access was: R RC vs BC AVF.  I had an extensive discussion with this patient in regards to the nature of access surgery, including risk, benefits, and alternatives.    The patient is aware that the risks of access surgery include but are not limited to: bleeding, infection, steal syndrome, nerve damage, ischemic monomelic neuropathy, failure of access to mature, and possible need for additional access procedures in the future.  The patient has agreed to proceed with the above procedure which will be scheduled for 07/25/15.   Leonides Sake, MD [facility] Office: [phone] Pager: [phone]  07/11/2015, 4:33 PM

## 2015-07-11 NOTE — Telephone Encounter (Signed)
Reviewed Thanks DRG 

## 2015-07-13 ENCOUNTER — Encounter: Payer: Self-pay | Admitting: Hematology & Oncology

## 2015-07-15 NOTE — Progress Notes (Signed)
HPI: FU coronary disease; status post PCI of his right coronary artery with drug- eluting stent in February 2006 and atrial flutter.An abdominal ultrasound in August 2006 showed no aneurysm. Carotid Dopplers in August of 2006 showed probable 0-39% stenosis. His most recent Myoview in April 2015 showed EF 61 and normal perfusion. Larey Seat and had hip fx 3/16 requiring surgical repair. Then admitted with anemia requiring transfusion. Readmitted 5/16 and found to have atrial flutter. Echo 5/16 showed EF 50 with basal inferior akinesis, mild LAE, mild RAE/RVE. Placed on amiodarone and converted. Subsequently had EP study that showed atrial fibrillation and left atrial flutter; no right atrial flutter and therefore ablation not performed. Admitted in January 2017 with heart failure. Echocardiogram showed ejection fraction 20-25%. There was grade 2 diastolic dysfunction, moderate mitral regurgitation, moderate left atrial enlargement and mild right ventricular enlargement. No thrombus noted using Definity. Patient did not undergo cardiac catheterization because of renal insufficiency. He is now on dialysis. Also recently treated for thrombocytopenia felt secondary to hepatitis C. Since last seen,   Current Outpatient Prescriptions  Medication Sig Dispense Refill  . amiodarone (PACERONE) 200 MG tablet Take 1 tablet (200 mg total) by mouth daily. 90 tablet 3  . calcium citrate-vitamin D (CITRACAL+D) 315-200 MG-UNIT per tablet Take 1 tablet by mouth daily.     . carvedilol (COREG) 12.5 MG tablet Take 1 tablet (12.5 mg total) by mouth 2 (two) times daily with a meal. 180 tablet 3  . Darbepoetin Alfa (ARANESP) 100 MCG/0.5ML SOSY injection Inject 0.5 mLs (100 mcg total) into the skin every 7 (seven) days. 4.2 mL 8  . docusate sodium (COLACE) 100 MG capsule Take 100 mg by mouth at bedtime as needed for moderate constipation (constipation). Reported on 04/20/2015    . famotidine (PEPCID) 20 MG tablet Take 20 mg  before breakfast and 20 mg before supper 60 tablet 6  . fish oil-omega-3 fatty acids 1000 MG capsule Take 1 g by mouth daily.    . fluticasone (FLONASE) 50 MCG/ACT nasal spray Place 1 spray into both nostrils daily as needed for allergies. 16 g 3  . hydroxypropyl methylcellulose (ISOPTO TEARS) 2.5 % ophthalmic solution Place 2 drops into both eyes 4 (four) times daily as needed for dry eyes. Reported on 04/20/2015    . levothyroxine (SYNTHROID, LEVOTHROID) 25 MCG tablet Take 1 tablet (25 mcg total) by mouth daily before breakfast. 90 tablet 3  . Multiple Vitamins-Minerals (MULTIVITAMIN WITH MINERALS) tablet Take 1 tablet by mouth daily.    . Nutritional Supplements (FEEDING SUPPLEMENT, NEPRO CARB STEADY,) LIQD Take 237 mLs by mouth daily. 30 Can 0  . ondansetron (ZOFRAN) 4 MG tablet Take 1 tablet (4 mg total) by mouth every 8 (eight) hours as needed for nausea or vomiting. 30 tablet 1  . terazosin (HYTRIN) 5 MG capsule Take 1 capsule (5 mg total) by mouth at bedtime. 90 capsule 3  . warfarin (COUMADIN) 2.5 MG tablet Take 1&1/2 tablets on Tuesdays/Thursdays/Saturdays; all other days, take 1 tablet. 40 tablet 2   No current facility-administered medications for this visit.     Past Medical History  Diagnosis Date  . Hyperlipidemia 04/02/2006  . Essential hypertension 12/09/2006  . Benign prostatic hypertrophy with nocturia 07/07/2006  . Chronic venous insufficiency 04/12/2010  . Obesity (BMI 30.0-34.9) 01/15/2012  . Constipation 05/25/2009    Intermittent   . Microcytic normochromic anemia 05/27/2006  . Internal and external hemorrhoids without complication 08/20/2012  . Coronary artery disease 04/02/2006  s/p RCA DES 2006, low risk myoview in 2011  . 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 Type 2 diabetes mellitus with peripheral artery disease (HCC) 05/27/2006    Absent pulses in the left foot   . Closed displaced fracture of left femoral neck (HCC) 06/14/2014    s/p left hip hemiarthroplasty June 14, 2014   . Atrial flutter (HCC) 07/26/2014    a. May 2016, CHA2DS2VASc = 4 -> Eliquis, spontaneous conversion to NSR;  b. On amio;  c. 01/2015 EPS: Unable to induce right sided Aflutter. Non-sustained Afib and Left sided Aflutter noted.  [name]. GERD (gastroesophageal reflux disease)   . 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 History of blood transfusion 05/2014    "related to hip OR"  . Degenerative joint disease involving multiple joints 02/02/2007  . Arthritis     "fingers" (02/07/2015)  . End stage renal disease on dialysis [facility](HCC)     Past Surgical History  Procedure Laterality Date  . Pilonidal cyst excision  1990's  . Refractive surgery Bilateral   . Fracture surgery    . Total hip arthroplasty Left 06/14/2014    Procedure: HEMI HIP ARTHROPLASTY ANTERIOR APPROACH;  Surgeon: Tarry KosNaiping M Xu, MD;  Location: [facility] OR;  Service: Orthopedics;  Laterality: Left;  . Joint replacement    . Leg amputation above knee Right ~ 2008  . Cholecystectomy N/A 12/12/2014    Procedure: LAPAROSCOPIC CHOLECYSTECTOMY WITH INTRAOPERATIVE CHOLANGIOGRAM;  Surgeon: Gaynelle AduEric Wilson, MD;  [facility];  Service: General;  Laterality: N/A;  . Electrophysiologic study N/A 02/07/2015    Procedure: A-Flutter Ablation;  Surgeon: Marinus MawGregg W Taylor, MD;  [facility];  Service: Cardiovascular;  Laterality: N/A;  . Tonsillectomy    . Coronary angioplasty with stent placement    . Prostate biopsy  ~ 2013  . Insertion of dialysis catheter Right 06/13/2015    Procedure: INSERTION OF DIALYSIS CATHETER RIGHT INTERNAL JUGULAR;  Surgeon: Larina Earthlyodd F Early, MD;  [facility];  Service: Vascular;  Laterality: Right;  . Removal of a  dialysis catheter Right 06/13/2015    Procedure: REMOVAL OF A DIALYSIS CATHETER;  Surgeon: Larina Earthlyodd F Early, MD;  [facility];  Service: Vascular;  Laterality: Right;    Social History   Social History  . Marital Status: Married    Spouse Name: N/A  . Number of Children: N/A  . Years of Education: N/A   Occupational History  . Not on file.   Social History Main Topics  . Smoking status: Former Smoker -- 1.00 packs/day for 10 years    Types: Cigarettes    Quit date: 06/16/1968  . Smokeless tobacco: Never Used  . Alcohol Use: No     Comment: "quit alcohol in the 1960's"  . Drug Use: No  . Sexual Activity: Not Currently    Birth Control/ Protection: None   Other Topics Concern  . Not on file   Social History Narrative    Family History  Problem Relation Age of Onset  . Hypertension Mother   . Heart attack Father   . Breast cancer Sister   . Arthritis Sister   . Heart attack Brother   . Diabetes Brother   .  Stroke Brother   . Pneumonia Daughter   . Diabetes Brother   . Alcoholism Brother   . Diabetes Brother   . Arthritis Sister     Bilateral knee replacement  . HIV Daughter   . Hypertension Daughter   . Drug abuse Daughter   . Schizophrenia Daughter   . Hypertension Daughter   . Drug abuse Daughter   . Bipolar disorder Daughter     ROS: no fevers or chills, productive cough, hemoptysis, dysphasia, odynophagia, melena, hematochezia, dysuria, hematuria, rash, seizure activity, orthopnea, PND, pedal edema, claudication. Remaining systems are negative.  Physical Exam: Well-developed well-nourished in no acute distress.  Skin is warm and dry.  HEENT is normal.  Neck is supple.  Chest is clear to auscultation with normal expansion.  Cardiovascular exam is regular rate and rhythm.  Abdominal exam nontender or distended. No masses palpated. Extremities show no edema. neuro grossly intact  ECG     This encounter was created in error - please disregard.

## 2015-07-16 ENCOUNTER — Telehealth: Payer: Self-pay | Admitting: Cardiology

## 2015-07-16 ENCOUNTER — Other Ambulatory Visit: Payer: Self-pay

## 2015-07-16 ENCOUNTER — Other Ambulatory Visit (HOSPITAL_COMMUNITY): Payer: Medicare Other

## 2015-07-16 DIAGNOSIS — I2510 Atherosclerotic heart disease of native coronary artery without angina pectoris: Secondary | ICD-10-CM

## 2015-07-16 NOTE — Telephone Encounter (Signed)
Will forward for dr crenshaw review  

## 2015-07-16 NOTE — Telephone Encounter (Signed)
Request for surgical clearance:  1. What type of surgery is being performed? Left arm fistula   2. When is this surgery scheduled? 07-25-15-3. Is there any medications that need to be held prior to surgery and how long?Coumadin-5 days prior.is this ok?   3. Name of physician performing surgery? Dr Leonides SakeBrian Chen   4. What is your office phone and fax number? [phone] and Fax is [phone] GNF:[id number]:Stephanie or put it in Epic  5.

## 2015-07-16 NOTE — Telephone Encounter (Signed)
Please schedule lexiscan nuclear study for preop eval as pt with newly decreased LV function recently. Coumadin can be held prior to procedure for 5 days; needs fuov  Olga MillersBrian Crenshaw

## 2015-07-17 ENCOUNTER — Encounter: Payer: Medicare Other | Admitting: Sports Medicine

## 2015-07-18 NOTE — Telephone Encounter (Signed)
Per dr Jens Somcrenshaw, the pt will also need the echo that is scheduled for June, nuclear stress test and ov prior to clearance. Judeth CornfieldStephanie with dr Imogene Burnchen aware. Left message for pt to call

## 2015-07-19 ENCOUNTER — Encounter: Payer: Medicare Other | Admitting: Cardiology

## 2015-07-23 ENCOUNTER — Telehealth (HOSPITAL_COMMUNITY): Payer: Self-pay | Admitting: *Deleted

## 2015-07-23 NOTE — Telephone Encounter (Signed)
Patient given detailed instructions per Myocardial Perfusion Study Information Sheet for the test on 07/25/15. Patient notified to arrive 15 minutes early and that it is imperative to arrive on time for appointment to keep from having the test rescheduled.  If you need to cancel or reschedule your appointment, please call the office within 24 hours of your appointment. Failure to do so may result in a cancellation of your appointment, and a $50 no show fee. Patient verbalized understanding.Andrian Sabala J Voncile Schwarz, RN  

## 2015-07-24 NOTE — Telephone Encounter (Signed)
Spoke with pt wife, echo and office visit rescheduled to a sooner date per dr Jens Somcrenshaw.

## 2015-07-25 ENCOUNTER — Ambulatory Visit (HOSPITAL_COMMUNITY): Payer: Medicare Other | Attending: Cardiology

## 2015-07-25 DIAGNOSIS — I2510 Atherosclerotic heart disease of native coronary artery without angina pectoris: Secondary | ICD-10-CM | POA: Diagnosis not present

## 2015-07-25 DIAGNOSIS — R110 Nausea: Secondary | ICD-10-CM

## 2015-07-25 DIAGNOSIS — R9439 Abnormal result of other cardiovascular function study: Secondary | ICD-10-CM | POA: Diagnosis not present

## 2015-07-25 DIAGNOSIS — I119 Hypertensive heart disease without heart failure: Secondary | ICD-10-CM | POA: Diagnosis not present

## 2015-07-25 LAB — MYOCARDIAL PERFUSION IMAGING
CHL CUP NUCLEAR SRS: 11
CHL CUP NUCLEAR SSS: 11
CSEPPHR: 69 {beats}/min
LHR: 0.35
LV dias vol: 179 mL (ref 62–150)
LVSYSVOL: 129 mL
NUC STRESS TID: 1.01
Rest HR: 57 {beats}/min
SDS: 0

## 2015-07-25 MED ORDER — REGADENOSON 0.4 MG/5ML IV SOLN
0.4000 mg | Freq: Once | INTRAVENOUS | Status: AC
  Administered 2015-07-25: 0.4 mg via INTRAVENOUS

## 2015-07-25 MED ORDER — TECHNETIUM TC 99M SESTAMIBI GENERIC - CARDIOLITE
10.9000 | Freq: Once | INTRAVENOUS | Status: AC | PRN
  Administered 2015-07-25: 10.9 via INTRAVENOUS

## 2015-07-25 MED ORDER — TECHNETIUM TC 99M SESTAMIBI GENERIC - CARDIOLITE
30.7000 | Freq: Once | INTRAVENOUS | Status: AC | PRN
  Administered 2015-07-25: 30.7 via INTRAVENOUS

## 2015-07-25 MED ORDER — AMINOPHYLLINE 25 MG/ML IV SOLN
75.0000 mg | Freq: Once | INTRAVENOUS | Status: AC
  Administered 2015-07-25: 75 mg via INTRAVENOUS

## 2015-07-30 ENCOUNTER — Ambulatory Visit (INDEPENDENT_AMBULATORY_CARE_PROVIDER_SITE_OTHER): Payer: Medicare Other | Admitting: Pharmacist

## 2015-07-30 DIAGNOSIS — I4891 Unspecified atrial fibrillation: Secondary | ICD-10-CM

## 2015-07-30 DIAGNOSIS — Z7901 Long term (current) use of anticoagulants: Secondary | ICD-10-CM

## 2015-07-30 LAB — POCT INR: INR: 2.9

## 2015-07-30 NOTE — Patient Instructions (Signed)
Patient instructed to take medications as defined in the Anti-coagulation Track section of this encounter.  Patient instructed to take today's dose.  Patient verbalized understanding of these instructions.    

## 2015-07-30 NOTE — Progress Notes (Signed)
Anti-Coagulation Progress Note  [name] [name] is a [age] y.o. male who is currently on an anti-coagulation regimen.    RECENT RESULTS: Recent results are below, the most recent result is correlated with a dose of 21.25 mg. per week: Lab Results  Component Value Date   INR 2.90 07/30/2015   INR 2.60 07/09/2015   INR 2.60 07/02/2015    ANTI-COAG DOSE: Anticoagulation Dose Instructions as of 07/30/2015      Glynis SmilesSun Mon Tue Wed Thu Fri Sat   New Dose 3.75 mg 2.5 mg 2.5 mg 2.5 mg 3.75 mg 2.5 mg 2.5 mg       ANTICOAG SUMMARY: Anticoagulation Episode Summary    Current INR goal 2.0-3.0   Next INR check 08/13/2015   INR from last check 2.90 (07/30/2015)   Weekly max dose    Target end date    INR check location    Preferred lab    Send INR reminders to       Comments         ANTICOAG TODAY: Anticoagulation Summary as of 07/30/2015    INR goal 2.0-3.0   Selected INR 2.90 (07/30/2015)   Next INR check 08/13/2015   Target end date     Anticoagulation Episode Summary    INR check location    Preferred lab    Send INR reminders to    Comments       PATIENT INSTRUCTIONS: Patient Instructions  Patient instructed to take medications as defined in the Anti-coagulation Track section of this encounter.  Patient instructed to take today's dose.  Patient verbalized understanding of these instructions.       FOLLOW-UP Return in 2 weeks (on 08/13/2015) for Follow up INR at 1030h.Hulen Luster.  [name], III Pharm.D., CACP

## 2015-07-30 NOTE — Progress Notes (Signed)
INTERNAL MEDICINE TEACHING ATTENDING ADDENDUM - Antrone Walla M.D  Duration- indefinite, Indication- aflutter, INR- therapeutic. Agree with pharmacy recommendations as outlined in their note.      

## 2015-08-01 ENCOUNTER — Encounter (HOSPITAL_COMMUNITY): Payer: Medicare Other

## 2015-08-03 ENCOUNTER — Ambulatory Visit (INDEPENDENT_AMBULATORY_CARE_PROVIDER_SITE_OTHER): Payer: Medicare Other | Admitting: Internal Medicine

## 2015-08-03 ENCOUNTER — Telehealth: Payer: Self-pay | Admitting: *Deleted

## 2015-08-03 ENCOUNTER — Encounter: Payer: Self-pay | Admitting: Internal Medicine

## 2015-08-03 VITALS — BP 126/72 | HR 57 | Temp 98.2°F | Wt 163.5 lb

## 2015-08-03 DIAGNOSIS — I10 Essential (primary) hypertension: Secondary | ICD-10-CM

## 2015-08-03 DIAGNOSIS — Z23 Encounter for immunization: Secondary | ICD-10-CM | POA: Diagnosis not present

## 2015-08-03 DIAGNOSIS — Z992 Dependence on renal dialysis: Secondary | ICD-10-CM

## 2015-08-03 DIAGNOSIS — I25118 Atherosclerotic heart disease of native coronary artery with other forms of angina pectoris: Secondary | ICD-10-CM

## 2015-08-03 DIAGNOSIS — N186 End stage renal disease: Secondary | ICD-10-CM

## 2015-08-03 DIAGNOSIS — I132 Hypertensive heart and chronic kidney disease with heart failure and with stage 5 chronic kidney disease, or end stage renal disease: Secondary | ICD-10-CM | POA: Diagnosis not present

## 2015-08-03 DIAGNOSIS — E1122 Type 2 diabetes mellitus with diabetic chronic kidney disease: Secondary | ICD-10-CM | POA: Diagnosis not present

## 2015-08-03 DIAGNOSIS — I5022 Chronic systolic (congestive) heart failure: Secondary | ICD-10-CM

## 2015-08-03 DIAGNOSIS — I700 Atherosclerosis of aorta: Secondary | ICD-10-CM

## 2015-08-03 DIAGNOSIS — J302 Other seasonal allergic rhinitis: Secondary | ICD-10-CM

## 2015-08-03 DIAGNOSIS — N401 Enlarged prostate with lower urinary tract symptoms: Secondary | ICD-10-CM

## 2015-08-03 DIAGNOSIS — I4892 Unspecified atrial flutter: Secondary | ICD-10-CM

## 2015-08-03 DIAGNOSIS — R351 Nocturia: Secondary | ICD-10-CM

## 2015-08-03 DIAGNOSIS — Z7901 Long term (current) use of anticoagulants: Secondary | ICD-10-CM

## 2015-08-03 DIAGNOSIS — B182 Chronic viral hepatitis C: Secondary | ICD-10-CM

## 2015-08-03 NOTE — Assessment & Plan Note (Signed)
Assessment  He has no signs or symptoms of claudication at this time.  Plan  We will continue to monitor for signs or symptoms suggesting complications of his aortic atherosclerosis. We continue with risk factor modification, specifically treatment of his hypertension. We are reassessing the control of his diabetes with CBGs now that his hemoglobin A1c's are inaccurate.

## 2015-08-03 NOTE — Progress Notes (Signed)
   Subjective:    Patient ID: [name] [name], male    DOB: 08/20/1945, 70 y.o.   MRN: [id number]  HPI  [name] [name] is here for follow-up of his diabetes, end stage renal disease requiring chronic hemodialysis, and hypertension. Please see the A&P for the status of the pt's chronic medical problems.  Review of Systems  Constitutional: Positive for fatigue. Negative for activity change, appetite change and unexpected weight change.       He feels fatigued for about 30 minutes after each dialysis run.  Respiratory: Negative for chest tightness and shortness of breath.   Cardiovascular: Negative for chest pain, palpitations and leg swelling.  Gastrointestinal: Positive for nausea and vomiting. Negative for abdominal pain, diarrhea, constipation and abdominal distention.       Nausea and vomiting very rare now, much improved since prior to dialysis.  Genitourinary: Positive for decreased urine volume.       Still makes urine, but the amount is decreasing since stating dialysis.  Musculoskeletal: Negative for myalgias, back pain, joint swelling and arthralgias.  Skin: Positive for color change. Negative for rash and wound.       Notes darkening of the hands since starting dialysis.  Neurological: Positive for light-headedness and headaches. Negative for syncope.       Headaches and lightheadedness only occur if they have taken too much fluid off in dialysis.      Objective:   Physical Exam  Constitutional: He is oriented to person, place, and time. He appears well-developed and well-nourished. No distress.  HENT:  Head: Normocephalic and atraumatic.  Eyes: Conjunctivae are normal. Right eye exhibits no discharge. Left eye exhibits no discharge. No scleral icterus.  Cardiovascular: Normal rate, regular rhythm and normal heart sounds.  Exam reveals no gallop and no friction rub.   No murmur heard. Pulmonary/Chest: Effort normal and breath sounds normal. No respiratory distress. He has  no wheezes. He has no rales.  Neurological: He is alert and oriented to person, place, and time. He exhibits normal muscle tone.  Skin: Skin is warm and dry. No rash noted. He is not diaphoretic. No erythema.  Darkening of dorsum and palmar creases.  Psychiatric: He has a normal mood and affect. His behavior is normal. Judgment and thought content normal.  Nursing note and vitals reviewed.     Assessment & Plan:   Please see problem oriented charting.

## 2015-08-03 NOTE — Assessment & Plan Note (Signed)
Assessment  He has no signs or symptoms suggestive of decompensation of his chronic systolic heart failure. This is on carvedilol 12.5 mg by mouth twice daily and 3 times a week hemodialysis.  Plan  We will continue the carvedilol at 12.5 mg by mouth twice daily and 3 times a week dialysis. We will reassess for signs and symptoms consistent with decompensation of his chronic systolic heart failure at the follow-up visit.

## 2015-08-03 NOTE — Patient Instructions (Signed)
It was great to see you again.  1) Stop the terazosin.  2) We gave you a hepatitis A shot.  3) Keep taking your medications like you are.  4) Start checking your sugars again.  This will tell us about your diabetes on dialysis.  5) Schedule an eye exam.  6) Check into dental coverage from your insurance.  7) I will write sleeve prescription for your prosthesis.  I will see you back in 3 months, sooner if necessary.

## 2015-08-03 NOTE — Assessment & Plan Note (Signed)
Assessment  He recently underwent a Myoview as he prepares for his dialysis fistula placement. This was found to be high risk study. Nonetheless, he continues to remain without angina on the carvedilol.  Plan  We will continue the carvedilol at 12.5 mg by mouth twice daily and reassess for evidence of angina at the follow-up visit.

## 2015-08-03 NOTE — Assessment & Plan Note (Signed)
Assessment  He has not been checking his sugars at home and therefore we are unsure how well controlled his diabetes is. He denies any polyuria or polydipsia and states he does not have the same feelings he used to get when his sugars were high. Given he is now on hemodialysis hemoglobin A1c's are misleading and he will require blood sugar measurements to assess the control of his diabetes.  Plan  He has the meter, lancets, and strips at home and will restart blood sugar checks. He will bring this log into the clinic at the next visit for our review and this will give us a much better understanding of his current diabetic control. Further adjustments in his regimen, if needed, will be made at that time. For now, he will continue with diet control.

## 2015-08-03 NOTE — Assessment & Plan Note (Signed)
He received the final hepatitis A shot today. We will discuss the issue of Zostavax at the follow-up visit. He is otherwise up-to-date on his health care maintenance.

## 2015-08-03 NOTE — Assessment & Plan Note (Signed)
Assessment  His uremic symptoms have resolved with the initiation of hemodialysis. They are falling into a routine in terms of how much fluid to withdraw and he is tolerating the dialysis runs better. That being said, he continues to have fatigue for about 30 minutes after each dialysis run.  Plan  He will continue with the hemodialysis 3 times a week. He is currently contemplating renal transplantation and is discussing this with his wife. No decisions on proceeding forward have been made as of yet. We will reassess where they are on the issue of renal transplantation at the follow-up visit.

## 2015-08-03 NOTE — Assessment & Plan Note (Signed)
Assessment  He has had no signs or symptoms consistent with recurrence of his paroxysmal atrial flutter. He was switched back to warfarin from Eliquis given his current dialysis requirements.  Plan  We will continue with the warfarin with doses monitored and adjusted in the anticoagulation clinic. He also remains on carvedilol 12.5 mg by mouth twice daily should he flipped back into atrial flutter.

## 2015-08-03 NOTE — Assessment & Plan Note (Signed)
Assessment  He is now interested in considering therapy for his hepatitis C. He is in the process of trying to set up an [facility]  Plan  I asked his wife to recall the [facility] to set up an appointment for evaluation for his hepatitis C and consideration of initiation of therapy. We will reassess the status of his hepatitis C evaluation and therapy at the follow-up visit.

## 2015-08-03 NOTE — Assessment & Plan Note (Signed)
Assessment  His symptoms have been quiesced and lately and has not required his Flonase.  Plan  He will continue to keep Flonase 1 spray each nostril daily as needed at home for when his allergies do start to act up once again.

## 2015-08-03 NOTE — Assessment & Plan Note (Signed)
Assessment  His blood pressure is well controlled today at 126/72. This is on carvedilol 12.5 mg twice daily and Terazosin 5 mg by mouth at night.  Plan  As he no longer requires off of blockade for bladder outlet obstruction symptoms given his decrease in urine output we will see if we can control the blood pressure with carvedilol 12.5 mg by mouth twice daily alone. We will reassess the blood pressure off the Terazosin at the follow-up visit.

## 2015-08-03 NOTE — Assessment & Plan Note (Signed)
Assessment  He is making much less urine since starting hemodialysis and nocturia has not been a problem. He also notes occasional hypotension with dialysis and feeling fatigued. Some of this may be related to blood pressure drops during the sessions. It may be that he no longer requires the alpha blockade for his benign prosthetic hypertrophy since he is making much less urine.  Plan  We will stop the Terazosin and reassess his symptoms of bladder outlet obstruction at the follow-up visit.

## 2015-08-03 NOTE — Telephone Encounter (Signed)
Call made to [employer] to inquire about pt's request for supplies for his prosthesis.  Pt has had lost weight and his prosthesis no longer fit properly.  At pt's request I contacted [employer] to speak with Kathlene NovemberMike, but he was out of the office today.  Spoke with April and explained pt's situation-she will need to see patient but it sounded like pt needed either an order for "prosthesis and liner supplies" or "socket-replacement".   She asked that I call back on Monday and speak with Kathlene NovemberMike (the owner) when he returns on Monday.Criss AlvineGoldston, Darlene Cassady5/12/201711:22 AM

## 2015-08-06 ENCOUNTER — Encounter: Payer: Medicare Other | Admitting: Sports Medicine

## 2015-08-06 NOTE — Progress Notes (Signed)
HPI: FU coronary disease; status post PCI of his right coronary artery with drug- eluting stent in February 2006 and atrial flutter.An abdominal ultrasound in August 2006 showed no aneurysm. Carotid Dopplers in August of 2006 showed probable 0-39% stenosis. Larey Seat and had hip fx 3/16 requiring surgical repair. Then admitted with anemia requiring transfusion. Readmitted 5/16 and found to have atrial flutter. Echo 5/16 showed EF 50 with basal inferior akinesis, mild LAE, mild RAE/RVE. Placed on amiodarone and converted. Patient had attempted atrial flutter ablation in November 2016 but was unsuccessful. Patient admitted with congestive heart failure in January. Echocardiogram showed ejection fraction 20-25%, moderate MR, moderate LAE, mild RVE. Catheterization not performed because of poor renal function. Nuclear study May 2017 showed ejection fraction 28%. There was a large inferior apical and inferior lateral wall infarct with no ischemia. Since last seen, he denies dyspnea, chest pain, palpitations or syncope. He is now on dialysis and his blood pressure is running low.  Current Outpatient Prescriptions  Medication Sig Dispense Refill  . amiodarone (PACERONE) 200 MG tablet Take 1 tablet (200 mg total) by mouth daily. 90 tablet 3  . calcium citrate-vitamin D (CITRACAL+D) 315-200 MG-UNIT per tablet Take 1 tablet by mouth daily.     . carvedilol (COREG) 12.5 MG tablet Take 1 tablet (12.5 mg total) by mouth 2 (two) times daily with a meal. 180 tablet 3  . docusate sodium (COLACE) 100 MG capsule Take 100 mg by mouth at bedtime as needed for moderate constipation (constipation). Reported on 08/03/2015    . famotidine (PEPCID) 20 MG tablet Take 20 mg before breakfast and 20 mg before supper 60 tablet 6  . fish oil-omega-3 fatty acids 1000 MG capsule Take 1 g by mouth daily.    . fluticasone (FLONASE) 50 MCG/ACT nasal spray Place 1 spray into both nostrils daily as needed for allergies. 16 g 3  .  hydroxypropyl methylcellulose (ISOPTO TEARS) 2.5 % ophthalmic solution Place 2 drops into both eyes 4 (four) times daily as needed for dry eyes. Reported on 04/20/2015    . levothyroxine (SYNTHROID, LEVOTHROID) 25 MCG tablet Take 1 tablet (25 mcg total) by mouth daily before breakfast. 90 tablet 3  . Multiple Vitamins-Minerals (MULTIVITAMIN WITH MINERALS) tablet Take 1 tablet by mouth daily.    . ondansetron (ZOFRAN) 4 MG tablet Take 1 tablet (4 mg total) by mouth every 8 (eight) hours as needed for nausea or vomiting. 30 tablet 1  . warfarin (COUMADIN) 2.5 MG tablet Take 1&1/2 tablets on Tuesdays/Thursdays/Saturdays; all other days, take 1 tablet. 40 tablet 2   No current facility-administered medications for this visit.     Past Medical History  Diagnosis Date  . Hyperlipidemia 04/02/2006  . Essential hypertension 12/09/2006  . Benign prostatic hypertrophy with nocturia 07/07/2006  . Chronic venous insufficiency 04/12/2010  . Obesity (BMI 30.0-34.9) 01/15/2012  . Constipation 05/25/2009    Intermittent   . Microcytic normochromic anemia 05/27/2006  . Internal and external hemorrhoids without complication 08/20/2012  . Coronary artery disease 04/02/2006    s/p RCA DES 2006, low risk myoview in 2011  . 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 Type 2 diabetes mellitus with peripheral artery disease (HCC) 05/27/2006    Absent pulses in the  left foot   . Closed displaced fracture of left femoral neck (HCC) 06/14/2014    s/p left hip hemiarthroplasty June 14, 2014   . Atrial flutter (HCC) 07/26/2014    a. May 2016, CHA2DS2VASc = 4 -> Eliquis, spontaneous conversion to NSR;  b. On amio;  c. 01/2015 EPS: Unable to induce right sided Aflutter. Non-sustained Afib and Left sided  Aflutter noted.  [name]. GERD (gastroesophageal reflux disease)   . 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 History of blood transfusion 05/2014    "related to hip OR"  . Degenerative joint disease involving multiple joints 02/02/2007  . Arthritis     "fingers" (02/07/2015)  . End stage renal disease on dialysis [facility](HCC)     Past Surgical History  Procedure Laterality Date  . Pilonidal cyst excision  1990's  . Refractive surgery Bilateral   . Fracture surgery    . Total hip arthroplasty Left 06/14/2014    Procedure: HEMI HIP ARTHROPLASTY ANTERIOR APPROACH;  Surgeon: Tarry KosNaiping M Xu, MD;  Location: [facility] OR;  Service: Orthopedics;  Laterality: Left;  . Joint replacement    . Leg amputation above knee Right ~ 2008  . Cholecystectomy N/A 12/12/2014    Procedure: LAPAROSCOPIC CHOLECYSTECTOMY WITH INTRAOPERATIVE CHOLANGIOGRAM;  Surgeon: Gaynelle AduEric Wilson, MD;  [facility];  Service: General;  Laterality: N/A;  . Electrophysiologic study N/A 02/07/2015    Procedure: A-Flutter Ablation;  Surgeon: Marinus MawGregg W Taylor, MD;  [facility];  Service: Cardiovascular;  Laterality: N/A;  . Tonsillectomy    . Coronary angioplasty with stent placement    . Prostate biopsy  ~ 2013  . Insertion of dialysis catheter Right 06/13/2015    Procedure: INSERTION OF DIALYSIS CATHETER RIGHT INTERNAL JUGULAR;  Surgeon: Larina Earthlyodd F Early, MD;  [facility];  Service: Vascular;  Laterality: Right;  . Removal of a dialysis catheter Right 06/13/2015    Procedure: REMOVAL OF A DIALYSIS CATHETER;  Surgeon: Larina Earthlyodd F Early, MD;  [facility];  Service: Vascular;  Laterality: Right;    Social History   Social History  . Marital Status: Married    Spouse Name: N/A  . Number of Children: N/A  . Years of Education: N/A   Occupational History  . Not on file.   Social History Main Topics  . Smoking status: Former Smoker -- 1.00 packs/day for 10 years    Types: Cigarettes    Quit date:  06/16/1968  . Smokeless tobacco: Never Used  . Alcohol Use: No     Comment: "quit alcohol in the 1960's"  . Drug Use: No  . Sexual Activity: Not Currently    Birth Control/ Protection: None   Other Topics Concern  . Not on file   Social History Narrative    Family History  Problem Relation Age of Onset  . Hypertension Mother   . Heart attack Father   . Breast cancer Sister   . Arthritis Sister   . Heart attack Brother   . Diabetes Brother   . Stroke Brother   . Pneumonia Daughter   . Diabetes Brother   . Alcoholism Brother   . Diabetes Brother   . Arthritis Sister     Bilateral knee replacement  . HIV Daughter   . Hypertension Daughter   . Drug abuse Daughter   . Schizophrenia Daughter   . Hypertension Daughter   . Drug abuse Daughter   . Bipolar disorder Daughter  ROS: no fevers or chills, productive cough, hemoptysis, dysphasia, odynophagia, melena, hematochezia, dysuria, hematuria, rash, seizure activity, orthopnea, PND, pedal edema, claudication. Remaining systems are negative.  Physical Exam: Well-developed well-nourished in no acute distress.  Skin is warm and dry.  HEENT is normal.  Neck is supple.  Chest is clear to auscultation with normal expansion.  Cardiovascular exam is regular rate and rhythm.  Abdominal exam nontender or distended. No masses palpated. Extremities s/p amputation neuro grossly intact

## 2015-08-08 ENCOUNTER — Ambulatory Visit (HOSPITAL_COMMUNITY): Payer: Medicare Other | Attending: Cardiology

## 2015-08-08 ENCOUNTER — Other Ambulatory Visit: Payer: Self-pay

## 2015-08-08 DIAGNOSIS — I5023 Acute on chronic systolic (congestive) heart failure: Secondary | ICD-10-CM | POA: Insufficient documentation

## 2015-08-08 DIAGNOSIS — I132 Hypertensive heart and chronic kidney disease with heart failure and with stage 5 chronic kidney disease, or end stage renal disease: Secondary | ICD-10-CM | POA: Diagnosis not present

## 2015-08-08 DIAGNOSIS — E1122 Type 2 diabetes mellitus with diabetic chronic kidney disease: Secondary | ICD-10-CM | POA: Insufficient documentation

## 2015-08-08 DIAGNOSIS — I2510 Atherosclerotic heart disease of native coronary artery without angina pectoris: Secondary | ICD-10-CM | POA: Insufficient documentation

## 2015-08-08 DIAGNOSIS — Z89611 Acquired absence of right leg above knee: Secondary | ICD-10-CM | POA: Insufficient documentation

## 2015-08-08 DIAGNOSIS — N186 End stage renal disease: Secondary | ICD-10-CM | POA: Insufficient documentation

## 2015-08-08 DIAGNOSIS — B1920 Unspecified viral hepatitis C without hepatic coma: Secondary | ICD-10-CM | POA: Insufficient documentation

## 2015-08-08 DIAGNOSIS — Z992 Dependence on renal dialysis: Secondary | ICD-10-CM | POA: Diagnosis not present

## 2015-08-08 DIAGNOSIS — Z8249 Family history of ischemic heart disease and other diseases of the circulatory system: Secondary | ICD-10-CM | POA: Diagnosis not present

## 2015-08-08 DIAGNOSIS — Z87891 Personal history of nicotine dependence: Secondary | ICD-10-CM | POA: Diagnosis not present

## 2015-08-08 DIAGNOSIS — I739 Peripheral vascular disease, unspecified: Secondary | ICD-10-CM | POA: Diagnosis not present

## 2015-08-08 DIAGNOSIS — E785 Hyperlipidemia, unspecified: Secondary | ICD-10-CM | POA: Diagnosis not present

## 2015-08-10 ENCOUNTER — Other Ambulatory Visit: Payer: Self-pay | Admitting: *Deleted

## 2015-08-10 ENCOUNTER — Encounter: Payer: Self-pay | Admitting: Cardiology

## 2015-08-10 ENCOUNTER — Ambulatory Visit (INDEPENDENT_AMBULATORY_CARE_PROVIDER_SITE_OTHER): Payer: Medicare Other | Admitting: Cardiology

## 2015-08-10 VITALS — BP 122/72 | HR 60 | Ht 68.0 in | Wt 164.0 lb

## 2015-08-10 DIAGNOSIS — Z01818 Encounter for other preprocedural examination: Secondary | ICD-10-CM

## 2015-08-10 DIAGNOSIS — R9439 Abnormal result of other cardiovascular function study: Secondary | ICD-10-CM

## 2015-08-10 DIAGNOSIS — I10 Essential (primary) hypertension: Secondary | ICD-10-CM

## 2015-08-10 DIAGNOSIS — D689 Coagulation defect, unspecified: Secondary | ICD-10-CM | POA: Diagnosis not present

## 2015-08-10 DIAGNOSIS — I4892 Unspecified atrial flutter: Secondary | ICD-10-CM

## 2015-08-10 DIAGNOSIS — E785 Hyperlipidemia, unspecified: Secondary | ICD-10-CM | POA: Insufficient documentation

## 2015-08-10 MED ORDER — CARVEDILOL 12.5 MG PO TABS: 6.2500 mg | ORAL_TABLET | Freq: Two times a day (BID) | ORAL | Status: DC

## 2015-08-10 MED ORDER — ASPIRIN EC 81 MG PO TBEC: 81.0000 mg | DELAYED_RELEASE_TABLET | Freq: Every day | ORAL | Status: DC

## 2015-08-10 MED ORDER — CARVEDILOL 6.25 MG PO TABS: 6.2500 mg | ORAL_TABLET | Freq: Two times a day (BID) | ORAL | Status: DC

## 2015-08-10 NOTE — Assessment & Plan Note (Signed)
LV function is nearly reduced.Plan cardiac catheterization as outlined. Decrease carvedilol as he is having blood pressure issues on dialysis days. I will not add an ACE inhibitor or hydralazine/nitrates because of this.

## 2015-08-10 NOTE — Assessment & Plan Note (Signed)
Given documented coronary disease will resume Pravachol 40 mg daily. I will not add high-dose statin given history of hepatitis C and amiodarone use.

## 2015-08-10 NOTE — Assessment & Plan Note (Signed)
Patient's blood pressure is running low on dialysis. Decrease carvedilol to 6.25 mg twice a day and hold a.m. Dose on dialysis days. No ACE inhibitor or hydralazine/nitrates given problems with hypotension on dialysis. If blood pressure continues to run low we may need to discontinue carvedilol altogether.

## 2015-08-10 NOTE — Assessment & Plan Note (Signed)
Plan to continue carvedilol. Continue amiodarone. Check TSH and liver functions. Continue Coumadin long-term.

## 2015-08-10 NOTE — Patient Instructions (Signed)
SCHEDULE FOR 5/ 22/17 - Monday.Your physician has requested that you have a cardiac catheterization. Cardiac catheterization is used to diagnose and/or treat various heart conditions. Doctors may recommend this procedure for a number of different reasons. The most common reason is to evaluate chest pain. Chest pain can be a symptom of coronary artery disease (CAD), and cardiac catheterization can show whether plaque is narrowing or blocking your heart's arteries. This procedure is also used to evaluate the valves, as well as measure the blood flow and oxygen levels in different parts of your heart. For further information please visit [email or web address] Please follow instruction sheet, as given.  LABS TODAY FOR CATH-- CMP ,TSH,CBC, PT ,PTT,  DECREASE CARVEDILOL TO 6.25 MG  ( 1/2 TABLET OF 12.5 MG TABLET ) TWICE A DAY.   ON DAYS TO DIAYLSIS  -DO NOT TAKE CARVEDILOL THE MORNING  OF DIAYLSIS  START ASPIRIN 81 MG ONE TABLET DAILY - TAKE THE MORNING OF CATH AS WELL.  DO NOT WARFARIN UNTIL AFTER CARDIAC CATH - INSTRUCTION WILL BE GIVEN  Your physician recommends that you schedule a follow-up appointment in 8-12 WEEKS WITH DR CRENSHAW.  If you need a refill on your cardiac medications before your next appointment, please call your pharmacy.

## 2015-08-10 NOTE — Assessment & Plan Note (Signed)
Patient has newly reduced LV function and evidence of new infarct on nuclear study. Resume Pravachol 40 mg daily. He needs to proceed with cardiac catheterization given reduced LV function. The risks and benefits including stroke, myocardial infarction and death were discussed and he agrees to proceed. Hold Coumadin prior to procedure and resume after. Add aspirin 81 mg daily.

## 2015-08-11 LAB — PROTIME-INR
INR: 2.73 — AB (ref <1.50)
Prothrombin Time: 29.3 seconds — ABNORMAL HIGH (ref 11.6–15.2)

## 2015-08-11 LAB — CBC
HEMATOCRIT: 40.3 % (ref 38.5–50.0)
Hemoglobin: 12.4 g/dL — ABNORMAL LOW (ref 13.2–17.1)
MCH: 24.1 pg — ABNORMAL LOW (ref 27.0–33.0)
MCHC: 30.8 g/dL — AB (ref 32.0–36.0)
MCV: 78.4 fL — ABNORMAL LOW (ref 80.0–100.0)
Platelets: 54 10*3/uL — ABNORMAL LOW (ref 140–400)
RBC: 5.14 MIL/uL (ref 4.20–5.80)
RDW: 19.2 % — AB (ref 11.0–15.0)
WBC: 5.4 10*3/uL (ref 3.8–10.8)

## 2015-08-11 LAB — COMPREHENSIVE METABOLIC PANEL
ALK PHOS: 68 U/L (ref 40–115)
ALT: 20 U/L (ref 9–46)
AST: 36 U/L — AB (ref 10–35)
Albumin: 2.8 g/dL — ABNORMAL LOW (ref 3.6–5.1)
BILIRUBIN TOTAL: 0.7 mg/dL (ref 0.2–1.2)
BUN: 30 mg/dL — AB (ref 7–25)
CO2: 21 mmol/L (ref 20–31)
CREATININE: 3.36 mg/dL — AB (ref 0.70–1.18)
Calcium: 7.7 mg/dL — ABNORMAL LOW (ref 8.6–10.3)
Chloride: 97 mmol/L — ABNORMAL LOW (ref 98–110)
Glucose, Bld: 216 mg/dL — ABNORMAL HIGH (ref 65–99)
Potassium: 3.6 mmol/L (ref 3.5–5.3)
Sodium: 132 mmol/L — ABNORMAL LOW (ref 135–146)
Total Protein: 6.7 g/dL (ref 6.1–8.1)

## 2015-08-11 LAB — APTT: APTT: 50 s — AB (ref 24–37)

## 2015-08-11 LAB — TSH: TSH: 4.88 m[IU]/L — ABNORMAL HIGH (ref 0.40–4.50)

## 2015-08-13 ENCOUNTER — Telehealth: Payer: Self-pay | Admitting: *Deleted

## 2015-08-13 ENCOUNTER — Ambulatory Visit (HOSPITAL_COMMUNITY)
Admission: RE | Admit: 2015-08-13 | Discharge: 2015-08-13 | Disposition: A | Discharge status: Home / Self Care | Payer: Medicare Other | Source: Ambulatory Visit | Attending: Cardiology | Admitting: Cardiology

## 2015-08-13 ENCOUNTER — Other Ambulatory Visit: Payer: Self-pay | Admitting: Hematology & Oncology

## 2015-08-13 ENCOUNTER — Telehealth: Payer: Self-pay | Admitting: Cardiology

## 2015-08-13 ENCOUNTER — Encounter (HOSPITAL_COMMUNITY)
Admission: RE | Disposition: A | Discharge status: Home / Self Care | Payer: Self-pay | Source: Ambulatory Visit | Attending: Cardiology

## 2015-08-13 ENCOUNTER — Telehealth: Payer: Self-pay

## 2015-08-13 ENCOUNTER — Ambulatory Visit: Payer: Medicare Other

## 2015-08-13 ENCOUNTER — Other Ambulatory Visit: Payer: Self-pay | Admitting: *Deleted

## 2015-08-13 DIAGNOSIS — E785 Hyperlipidemia, unspecified: Secondary | ICD-10-CM | POA: Insufficient documentation

## 2015-08-13 DIAGNOSIS — E1122 Type 2 diabetes mellitus with diabetic chronic kidney disease: Secondary | ICD-10-CM | POA: Insufficient documentation

## 2015-08-13 DIAGNOSIS — Z79899 Other long term (current) drug therapy: Secondary | ICD-10-CM | POA: Diagnosis not present

## 2015-08-13 DIAGNOSIS — E113293 Type 2 diabetes mellitus with mild nonproliferative diabetic retinopathy without macular edema, bilateral: Secondary | ICD-10-CM | POA: Insufficient documentation

## 2015-08-13 DIAGNOSIS — I4892 Unspecified atrial flutter: Secondary | ICD-10-CM | POA: Diagnosis not present

## 2015-08-13 DIAGNOSIS — Z955 Presence of coronary angioplasty implant and graft: Secondary | ICD-10-CM | POA: Insufficient documentation

## 2015-08-13 DIAGNOSIS — Z992 Dependence on renal dialysis: Secondary | ICD-10-CM | POA: Insufficient documentation

## 2015-08-13 DIAGNOSIS — N186 End stage renal disease: Secondary | ICD-10-CM | POA: Diagnosis not present

## 2015-08-13 DIAGNOSIS — K219 Gastro-esophageal reflux disease without esophagitis: Secondary | ICD-10-CM | POA: Insufficient documentation

## 2015-08-13 DIAGNOSIS — E1151 Type 2 diabetes mellitus with diabetic peripheral angiopathy without gangrene: Secondary | ICD-10-CM | POA: Insufficient documentation

## 2015-08-13 DIAGNOSIS — Z7901 Long term (current) use of anticoagulants: Secondary | ICD-10-CM | POA: Diagnosis not present

## 2015-08-13 DIAGNOSIS — Z89611 Acquired absence of right leg above knee: Secondary | ICD-10-CM | POA: Diagnosis not present

## 2015-08-13 DIAGNOSIS — I2510 Atherosclerotic heart disease of native coronary artery without angina pectoris: Secondary | ICD-10-CM | POA: Insufficient documentation

## 2015-08-13 DIAGNOSIS — Z01818 Encounter for other preprocedural examination: Secondary | ICD-10-CM

## 2015-08-13 DIAGNOSIS — E1142 Type 2 diabetes mellitus with diabetic polyneuropathy: Secondary | ICD-10-CM | POA: Insufficient documentation

## 2015-08-13 DIAGNOSIS — Z5309 Procedure and treatment not carried out because of other contraindication: Secondary | ICD-10-CM | POA: Diagnosis not present

## 2015-08-13 DIAGNOSIS — I120 Hypertensive chronic kidney disease with stage 5 chronic kidney disease or end stage renal disease: Secondary | ICD-10-CM | POA: Insufficient documentation

## 2015-08-13 DIAGNOSIS — R9439 Abnormal result of other cardiovascular function study: Secondary | ICD-10-CM | POA: Insufficient documentation

## 2015-08-13 DIAGNOSIS — Z96642 Presence of left artificial hip joint: Secondary | ICD-10-CM | POA: Insufficient documentation

## 2015-08-13 DIAGNOSIS — Z87891 Personal history of nicotine dependence: Secondary | ICD-10-CM | POA: Diagnosis not present

## 2015-08-13 LAB — PROTIME-INR
INR: 2.09 — ABNORMAL HIGH (ref 0.00–1.49)
PROTHROMBIN TIME: 23.4 s — AB (ref 11.6–15.2)

## 2015-08-13 LAB — CBC
HCT: 37.8 % — ABNORMAL LOW (ref 39.0–52.0)
HEMOGLOBIN: 12.1 g/dL — AB (ref 13.0–17.0)
MCH: 24.2 pg — AB (ref 26.0–34.0)
MCHC: 32 g/dL (ref 30.0–36.0)
MCV: 75.6 fL — ABNORMAL LOW (ref 78.0–100.0)
Platelets: 66 10*3/uL — ABNORMAL LOW (ref 150–400)
RBC: 5 MIL/uL (ref 4.22–5.81)
RDW: 17.5 % — ABNORMAL HIGH (ref 11.5–15.5)
WBC: 4.9 10*3/uL (ref 4.0–10.5)

## 2015-08-13 LAB — GLUCOSE, CAPILLARY: GLUCOSE-CAPILLARY: 244 mg/dL — AB (ref 65–99)

## 2015-08-13 SURGERY — LEFT HEART CATH AND CORONARY ANGIOGRAPHY
Anesthesia: LOCAL

## 2015-08-13 MED ORDER — ASPIRIN 81 MG PO CHEW: 81.0000 mg | CHEWABLE_TABLET | ORAL | Status: DC

## 2015-08-13 MED ORDER — SODIUM CHLORIDE 0.9 % IV SOLN
INTRAVENOUS | Status: DC
  Administered 2015-08-13: 08:00:00 via INTRAVENOUS

## 2015-08-13 MED ORDER — SODIUM CHLORIDE 0.9% FLUSH: 3.0000 mL | Freq: Two times a day (BID) | INTRAVENOUS | Status: DC

## 2015-08-13 NOTE — H&P (View-Only) (Signed)
HPI: FU coronary disease; status post PCI of his right coronary artery with drug- eluting stent in February 2006 and atrial flutter.An abdominal ultrasound in August 2006 showed no aneurysm. Carotid Dopplers in August of 2006 showed probable 0-39% stenosis. Larey Seat and had hip fx 3/16 requiring surgical repair. Then admitted with anemia requiring transfusion. Readmitted 5/16 and found to have atrial flutter. Echo 5/16 showed EF 50 with basal inferior akinesis, mild LAE, mild RAE/RVE. Placed on amiodarone and converted. Patient had attempted atrial flutter ablation in November 2016 but was unsuccessful. Patient admitted with congestive heart failure in January. Echocardiogram showed ejection fraction 20-25%, moderate MR, moderate LAE, mild RVE. Catheterization not performed because of poor renal function. Nuclear study May 2017 showed ejection fraction 28%. There was a large inferior apical and inferior lateral wall infarct with no ischemia. Since last seen, he denies dyspnea, chest pain, palpitations or syncope. He is now on dialysis and his blood pressure is running low.  Current Outpatient Prescriptions  Medication Sig Dispense Refill  . amiodarone (PACERONE) 200 MG tablet Take 1 tablet (200 mg total) by mouth daily. 90 tablet 3  . calcium citrate-vitamin D (CITRACAL+D) 315-200 MG-UNIT per tablet Take 1 tablet by mouth daily.     . carvedilol (COREG) 12.5 MG tablet Take 1 tablet (12.5 mg total) by mouth 2 (two) times daily with a meal. 180 tablet 3  . docusate sodium (COLACE) 100 MG capsule Take 100 mg by mouth at bedtime as needed for moderate constipation (constipation). Reported on 08/03/2015    . famotidine (PEPCID) 20 MG tablet Take 20 mg before breakfast and 20 mg before supper 60 tablet 6  . fish oil-omega-3 fatty acids 1000 MG capsule Take 1 g by mouth daily.    . fluticasone (FLONASE) 50 MCG/ACT nasal spray Place 1 spray into both nostrils daily as needed for allergies. 16 g 3  .  hydroxypropyl methylcellulose (ISOPTO TEARS) 2.5 % ophthalmic solution Place 2 drops into both eyes 4 (four) times daily as needed for dry eyes. Reported on 04/20/2015    . levothyroxine (SYNTHROID, LEVOTHROID) 25 MCG tablet Take 1 tablet (25 mcg total) by mouth daily before breakfast. 90 tablet 3  . Multiple Vitamins-Minerals (MULTIVITAMIN WITH MINERALS) tablet Take 1 tablet by mouth daily.    . ondansetron (ZOFRAN) 4 MG tablet Take 1 tablet (4 mg total) by mouth every 8 (eight) hours as needed for nausea or vomiting. 30 tablet 1  . warfarin (COUMADIN) 2.5 MG tablet Take 1&1/2 tablets on Tuesdays/Thursdays/Saturdays; all other days, take 1 tablet. 40 tablet 2   No current facility-administered medications for this visit.     Past Medical History  Diagnosis Date  . Hyperlipidemia 04/02/2006  . Essential hypertension 12/09/2006  . Benign prostatic hypertrophy with nocturia 07/07/2006  . Chronic venous insufficiency 04/12/2010  . Obesity (BMI 30.0-34.9) 01/15/2012  . Constipation 05/25/2009    Intermittent   . Microcytic normochromic anemia 05/27/2006  . Internal and external hemorrhoids without complication 08/20/2012  . Coronary artery disease 04/02/2006    s/p RCA DES 2006, low risk myoview in 2011  . 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 Type 2 diabetes mellitus with peripheral artery disease (HCC) 05/27/2006    Absent pulses in the  left foot   . Closed displaced fracture of left femoral neck (HCC) 06/14/2014    s/p left hip hemiarthroplasty June 14, 2014   . Atrial flutter (HCC) 07/26/2014    a. May 2016, CHA2DS2VASc = 4 -> Eliquis, spontaneous conversion to NSR;  b. On amio;  c. 01/2015 EPS: Unable to induce right sided Aflutter. Non-sustained Afib and Left sided  Aflutter noted.  [name]. GERD (gastroesophageal reflux disease)   . 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 History of blood transfusion 05/2014    "related to hip OR"  . Degenerative joint disease involving multiple joints 02/02/2007  . Arthritis     "fingers" (02/07/2015)  . End stage renal disease on dialysis [facility](HCC)     Past Surgical History  Procedure Laterality Date  . Pilonidal cyst excision  1990's  . Refractive surgery Bilateral   . Fracture surgery    . Total hip arthroplasty Left 06/14/2014    Procedure: HEMI HIP ARTHROPLASTY ANTERIOR APPROACH;  Surgeon: Tarry KosNaiping M Xu, MD;  Location: [facility] OR;  Service: Orthopedics;  Laterality: Left;  . Joint replacement    . Leg amputation above knee Right ~ 2008  . Cholecystectomy N/A 12/12/2014    Procedure: LAPAROSCOPIC CHOLECYSTECTOMY WITH INTRAOPERATIVE CHOLANGIOGRAM;  Surgeon: Gaynelle AduEric Wilson, MD;  [facility];  Service: General;  Laterality: N/A;  . Electrophysiologic study N/A 02/07/2015    Procedure: A-Flutter Ablation;  Surgeon: Marinus MawGregg W Taylor, MD;  [facility];  Service: Cardiovascular;  Laterality: N/A;  . Tonsillectomy    . Coronary angioplasty with stent placement    . Prostate biopsy  ~ 2013  . Insertion of dialysis catheter Right 06/13/2015    Procedure: INSERTION OF DIALYSIS CATHETER RIGHT INTERNAL JUGULAR;  Surgeon: Larina Earthlyodd F Early, MD;  [facility];  Service: Vascular;  Laterality: Right;  . Removal of a dialysis catheter Right 06/13/2015    Procedure: REMOVAL OF A DIALYSIS CATHETER;  Surgeon: Larina Earthlyodd F Early, MD;  [facility];  Service: Vascular;  Laterality: Right;    Social History   Social History  . Marital Status: Married    Spouse Name: N/A  . Number of Children: N/A  . Years of Education: N/A   Occupational History  . Not on file.   Social History Main Topics  . Smoking status: Former Smoker -- 1.00 packs/day for 10 years    Types: Cigarettes    Quit date:  06/16/1968  . Smokeless tobacco: Never Used  . Alcohol Use: No     Comment: "quit alcohol in the 1960's"  . Drug Use: No  . Sexual Activity: Not Currently    Birth Control/ Protection: None   Other Topics Concern  . Not on file   Social History Narrative    Family History  Problem Relation Age of Onset  . Hypertension Mother   . Heart attack Father   . Breast cancer Sister   . Arthritis Sister   . Heart attack Brother   . Diabetes Brother   . Stroke Brother   . Pneumonia Daughter   . Diabetes Brother   . Alcoholism Brother   . Diabetes Brother   . Arthritis Sister     Bilateral knee replacement  . HIV Daughter   . Hypertension Daughter   . Drug abuse Daughter   . Schizophrenia Daughter   . Hypertension Daughter   . Drug abuse Daughter   . Bipolar disorder Daughter  ROS: no fevers or chills, productive cough, hemoptysis, dysphasia, odynophagia, melena, hematochezia, dysuria, hematuria, rash, seizure activity, orthopnea, PND, pedal edema, claudication. Remaining systems are negative.  Physical Exam: Well-developed well-nourished in no acute distress.  Skin is warm and dry.  HEENT is normal.  Neck is supple.  Chest is clear to auscultation with normal expansion.  Cardiovascular exam is regular rate and rhythm.  Abdominal exam nontender or distended. No masses palpated. Extremities s/p amputation neuro grossly intact

## 2015-08-13 NOTE — Telephone Encounter (Signed)
-----   Message from Fransisco HertzBrian L Chen, MD sent at 08/13/2015 12:57 PM EDT ----- thx  ----- Message -----    From: Glori BickersStephanie N Basya Casavant, RN    Sent: 08/13/2015  12:42 PM      To: Fransisco HertzBrian L Chen, MD  [name] [name] has not been cleared from cardiology. He was scheduled for heart cath today but got cancelled due to INR 2.09 and PLT 66. His next OV with cardio. is 6/14.  He is scheduled for (L) arm R-C vs. B-C AVF this Wednesday, 5/24. I will cancel him until further notice. He is dialyzing through (R) IJ cath.   Thanks, Judeth CornfieldStephanie

## 2015-08-13 NOTE — Interval H&P Note (Signed)
History and Physical Interval Note:  08/13/2015 7:40 AM  [name] [name]  has presented today for surgery, with the diagnosis of Abnormal Nuclear Stress Test.  HIGH RISK - Inferior Infarct. - EF ~20-25%.  Now on HD.  The various methods of treatment have been discussed with the patient and family. After consideration of risks, benefits and other options for treatment, the patient has consented to  Procedure(s): Left Heart Cath and Coronary Angiography (N/A) as a surgical intervention .  The patient's history has been reviewed, patient examined, no change in status, stable for surgery.  I have reviewed the patient's chart and labs.  Questions were answered to the patient's satisfaction.    Cath Lab Visit (complete for each Cath Lab visit)  Clinical Evaluation Leading to the Procedure:   ACS: No.  Non-ACS:    Anginal Classification: No Symptoms  Anti-ischemic medical therapy: Minimal Therapy (1 class of medications)  Non-Invasive Test Results: High-risk stress test findings: cardiac mortality >3%/year  Prior CABG: No previous CABG  CAD Assessment (Coronary Angiography With or Without Left Heart Catheterization and/or Left Ventriculography)  Patient Information:   Suspected CAD (No Prior PCI, No Prior CABG, and No Prior Angiogram Showing > = 50% Angiographic Stenosis)   Prior Noninvasive Testing: Stress Test With Imaging (SPECT MPI, Stress Echocardiography, Stress PET, Stress CMR)   High-risk findings (e.g., 10% ischemic myocardium on stress SPECT MPI or stress PET, stress-induced wall motion abnormality in 2 or more segments on stress echo or stress CMR)   Pretest Symptom Status: Asymptomatic  AUC Score:   A (7)   Indication:   17 CAD Assessment (Coronary Angiography With or Without Left Heart Catheterization and/or Left Ventriculography)  Patient Information:    Suspected CAD (No Prior PCI, No Prior CABG, and No Prior Angiogram Showing >= 50% Angiographic Stenosis)   Prior  Noninvasive Testing: Stress Test With Imaging (SPECT MPI, Stress Echocardiography, Stress PET, Stress CMR)   Equivocal/uninterpretable findings (e.g., perfusion defect vs. attenuation artifact, uninterpretable stress imaging)   Pretest Symptom Status: Asymptomatic  AUC Score:   U (4)   Indication:   22   Bryan Lemmaavid Harding

## 2015-08-13 NOTE — Telephone Encounter (Signed)
NOTHING ELSE IS NEEDED AT PRESENT TIME.

## 2015-08-13 NOTE — Progress Notes (Signed)
Notified Dr Herbie BaltimoreHarding of INR 2.09, cath canceled.  Also notified Dr Jens Somrenshaw who had the cath set up that cath canceled secondary to INR 2.09, also repeated CBC per his request platelets today are 66.  Dr Jens Somrenshaw wants patient to stop ASA, continue Coumadin, follow up with Dr Colin BentonEnnevar for thrombocytopenia and Cardiology PA on 6/14 Wednesday at 10:30 with Ward Givenshris Berge at [facility].. Also spoke to Erin SonsJame Groce regarding INR check, patient was scheduled in internal medicine office today at 1030.  Notified him INR was already check he wants patient to come back to have INR checked on Monday 6/5 at 9:30 for Coumadin check.  Will call to notify the patient

## 2015-08-13 NOTE — Discharge Instructions (Signed)
Cancelled after pt was ready.

## 2015-08-13 NOTE — Telephone Encounter (Signed)
LEFT MESSAGE TO CALL BACK   INFORMING PATIENT TO RESTART PRAVASTATIN 40 MG DAILY--INFORMATION FROM OFFICE VISIT 08/10/15

## 2015-08-13 NOTE — Telephone Encounter (Signed)
Follow-up message    [name] [name] is returning the phone about the pt surgery she was not sure what the question was. She stated the surgery has been cancelled.

## 2015-08-15 ENCOUNTER — Ambulatory Visit (HOSPITAL_COMMUNITY): Admission: RE | Admit: 2015-08-15 | Payer: Medicare Other | Source: Ambulatory Visit | Admitting: Vascular Surgery

## 2015-08-15 ENCOUNTER — Encounter (HOSPITAL_COMMUNITY): Admission: RE | Payer: Self-pay | Source: Ambulatory Visit

## 2015-08-15 DIAGNOSIS — Z006 Encounter for examination for normal comparison and control in clinical research program: Secondary | ICD-10-CM

## 2015-08-15 SURGERY — ARTERIOVENOUS (AV) FISTULA CREATION
Anesthesia: Monitor Anesthesia Care | Laterality: Left

## 2015-08-15 NOTE — Progress Notes (Signed)
Late entry 10-13-15 Subject met inclusion and exclusion criteria. The informed consent form, study requirements and expectations were reviewed with the subject and questions and concerns were addressed prior to the signing of the consent form. The subject verbalized understanding of the trial requirements. The subject agreed to participate in the LEADERS FREE II trial and signed the informed consent. The informed consent was obtained prior to performance of any protocol-specific procedures for the subject. A copy of the signed informed consent was given to the subject and a copy was placed in the subject's medical record. The subject will be enrolled if angiographic criteria are meet.

## 2015-08-27 ENCOUNTER — Other Ambulatory Visit: Payer: Medicare Other

## 2015-08-27 ENCOUNTER — Ambulatory Visit (INDEPENDENT_AMBULATORY_CARE_PROVIDER_SITE_OTHER): Payer: Medicare Other | Admitting: Pharmacist

## 2015-08-27 DIAGNOSIS — K7400 Hepatic fibrosis, unspecified: Secondary | ICD-10-CM

## 2015-08-27 DIAGNOSIS — Z7901 Long term (current) use of anticoagulants: Secondary | ICD-10-CM

## 2015-08-27 DIAGNOSIS — I480 Paroxysmal atrial fibrillation: Secondary | ICD-10-CM

## 2015-08-27 DIAGNOSIS — B1710 Acute hepatitis C without hepatic coma: Secondary | ICD-10-CM

## 2015-08-27 LAB — HEPATITIS B CORE ANTIBODY, TOTAL: HEP B C TOTAL AB: REACTIVE — AB

## 2015-08-27 LAB — HEPATITIS A ANTIBODY, TOTAL: Hep A Total Ab: NONREACTIVE

## 2015-08-27 LAB — POCT INR: INR: 2.9

## 2015-08-27 NOTE — Patient Instructions (Signed)
Patient instructed to take medications as defined in the Anti-coagulation Track section of this encounter.  Patient instructed to take today's dose.  Patient verbalized understanding of these instructions.    

## 2015-08-27 NOTE — Progress Notes (Signed)
Anti-Coagulation Progress Note  [name] [name] is a 70 y.o. male who is currently on an anti-coagulation regimen.    RECENT RESULTS: Recent results are below, the most recent result is correlated with a dose of 20 mg. per week: Lab Results  Component Value Date   INR 2.90 08/27/2015   INR 2.09* 08/13/2015   INR 2.73* 08/10/2015    ANTI-COAG DOSE: Anticoagulation Dose Instructions as of 08/27/2015      Glynis SmilesSun Mon Tue Wed Thu Fri Sat   New Dose 3.75 mg 2.5 mg 2.5 mg 2.5 mg 2.5 mg 2.5 mg 2.5 mg       ANTICOAG SUMMARY: Anticoagulation Episode Summary    Current INR goal 2.0-3.0   Next INR check 09/17/2015   INR from last check 2.90 (08/27/2015)   Weekly max dose    Target end date    INR check location    Preferred lab    Send INR reminders to       Comments         ANTICOAG TODAY: Anticoagulation Summary as of 08/27/2015    INR goal 2.0-3.0   Selected INR 2.90 (08/27/2015)   Next INR check 09/17/2015   Target end date     Anticoagulation Episode Summary    INR check location    Preferred lab    Send INR reminders to    Comments       PATIENT INSTRUCTIONS: Patient Instructions  Patient instructed to take medications as defined in the Anti-coagulation Track section of this encounter.  Patient instructed to take today's dose.  Patient verbalized understanding of these instructions.       FOLLOW-UP Return in 3 weeks (on 09/17/2015) for Follow up INR at 0945h.  Hulen LusterJames Audria Takeshita, III Pharm.D., CACP

## 2015-08-27 NOTE — Progress Notes (Signed)
INTERNAL MEDICINE TEACHING ATTENDING ADDENDUM - Earl LagosNischal Ariele Vidrio M.D  Duration- indefinite, Indication- therapeutic, INR- pAfib. Agree with pharmacy recommendations as outlined in their note.

## 2015-08-30 LAB — HCV RNA,LIPA RFLX NS5A DRUG RESIST

## 2015-08-30 LAB — HCV RNA, QUANT REAL-TIME PCR W/REFLEX
HCV RNA, PCR, QN (LOG): 6.38 {Log_IU}/mL — AB
HCV RNA, PCR, QN: 2420000 IU/mL — ABNORMAL HIGH

## 2015-08-31 LAB — LIVER FIBROSIS, FIBROTEST-ACTITEST
ALPHA-2-MACROGLOBULIN: 288 mg/dL — AB (ref 106–279)
ALT: 24 U/L (ref 9–46)
APOLIPOPROTEIN A1: 105 mg/dL (ref 94–176)
BILIRUBIN: 0.5 mg/dL (ref 0.2–1.2)
FIBROSIS SCORE: 0.82
GGT: 128 U/L — ABNORMAL HIGH (ref 3–70)
Haptoglobin: 74 mg/dL (ref 43–212)
NECROINFLAMMAT ACT SCORE: 0.21
REFERENCE ID: 1543406

## 2015-09-01 LAB — HCV RNA NS5A DRUG RESISTANCE

## 2015-09-03 ENCOUNTER — Other Ambulatory Visit (HOSPITAL_COMMUNITY): Payer: Medicare Other

## 2015-09-05 ENCOUNTER — Encounter: Payer: Self-pay | Admitting: Cardiology

## 2015-09-05 ENCOUNTER — Encounter: Payer: Self-pay | Admitting: Nurse Practitioner

## 2015-09-05 ENCOUNTER — Ambulatory Visit (INDEPENDENT_AMBULATORY_CARE_PROVIDER_SITE_OTHER): Payer: Medicare Other | Admitting: Nurse Practitioner

## 2015-09-05 VITALS — BP 135/79 | HR 68 | Ht 68.0 in | Wt 165.0 lb

## 2015-09-05 DIAGNOSIS — I429 Cardiomyopathy, unspecified: Secondary | ICD-10-CM | POA: Diagnosis not present

## 2015-09-05 DIAGNOSIS — R9439 Abnormal result of other cardiovascular function study: Secondary | ICD-10-CM | POA: Diagnosis not present

## 2015-09-05 DIAGNOSIS — I119 Hypertensive heart disease without heart failure: Secondary | ICD-10-CM

## 2015-09-05 DIAGNOSIS — I4892 Unspecified atrial flutter: Secondary | ICD-10-CM

## 2015-09-05 DIAGNOSIS — I2510 Atherosclerotic heart disease of native coronary artery without angina pectoris: Secondary | ICD-10-CM | POA: Diagnosis not present

## 2015-09-05 DIAGNOSIS — E785 Hyperlipidemia, unspecified: Secondary | ICD-10-CM

## 2015-09-05 MED ORDER — PRAVASTATIN SODIUM 40 MG PO TABS: 40.0000 mg | ORAL_TABLET | Freq: Every evening | ORAL | Status: DC

## 2015-09-05 NOTE — Patient Instructions (Signed)
Your physician has requested that you have a Left Heart cardiac catheterization. Cardiac catheterization is used to diagnose and/or treat various heart conditions. Doctors may recommend this procedure for a number of different reasons. The most common reason is to evaluate chest pain. Chest pain can be a symptom of coronary artery disease (CAD), and cardiac catheterization can show whether plaque is narrowing or blocking your heart's arteries. This procedure is also used to evaluate the valves, as well as measure the blood flow and oxygen levels in different parts of your heart. For further information please visit [email or web address]. Please follow instruction sheet, as given.  Your physician recommends that you return for lab work   Hold Warfarin(Coumadin) 5 days prior to Heart Cath

## 2015-09-05 NOTE — Progress Notes (Signed)
Cardiology Clinic Note   Patient Name: [name] [name] Date of Encounter: 09/05/2015  Primary Care Provider:  Rocco Serene, MD Primary Cardiologist:  B. Jens Som, MD   Patient Profile    70 y/o ? with a h/o CAD, HTN, HL, PAF, and ESRD, who presents for f/u to arrange diagnostic catheterization.  Past Medical History    Past Medical History  Diagnosis Date  . Hyperlipidemia 04/02/2006  . Hypertensive heart disease 12/09/2006  . Benign prostatic hypertrophy with nocturia 07/07/2006  . Chronic venous insufficiency 04/12/2010  . Obesity (BMI 30.0-34.9) 01/15/2012  . Constipation 05/25/2009    Intermittent   . Microcytic normochromic anemia 05/27/2006  . Internal and external hemorrhoids without complication 08/20/2012  . Coronary artery disease 04/02/2006    a. s/p RCA DES 2006;  b. low risk myoview in 2011; c. 5/17 MV: EF 28%, large inf apical and inflat infarct w/o ischemia.  . 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 Type 2 diabetes mellitus with peripheral artery disease (HCC) 05/27/2006    Absent pulses in the left foot   . Closed displaced fracture of left femoral neck (HCC) 06/14/2014    s/p left hip hemiarthroplasty June 14, 2014   . Atrial flutter (HCC) 07/26/2014    a. May 2016, CHA2DS2VASc = 4 -> Eliquis, spontaneous conversion to NSR;  b. On amio;  c. 01/2015 EPS: Unable to induce right sided Aflutter. Non-sustained Afib and Left sided Aflutter noted.  [name] GERD (gastroesophageal reflux disease)   . 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 History of blood transfusion 05/2014    "related to hip OR"  . Degenerative joint disease involving multiple joints 02/02/2007  . Arthritis     "fingers" (02/07/2015)    . End stage renal disease on dialysis [facility])     a. TTS Dialysis - currently through right chest diatek, pending AVF.  [name] Cardiomyopathy (HCC)     a. 07/2014 Echo: EF 50%; b. 03/2015 Ech: EF 20-25%; c. 07/2015 Echo: EF 30-35%, Gr1 DD, sev LVH.   Past Surgical History  Procedure Laterality Date  . Pilonidal cyst excision  1990's  . Refractive surgery Bilateral   . Fracture surgery    . Total hip arthroplasty Left 06/14/2014    Procedure: HEMI HIP ARTHROPLASTY ANTERIOR APPROACH;  Surgeon: Tarry Kos, MD;  Location: [facility] OR;  Service: Orthopedics;  Laterality: Left;  . Joint replacement    . Leg amputation above knee Right ~ 2008  . Cholecystectomy N/A 12/12/2014    Procedure: LAPAROSCOPIC CHOLECYSTECTOMY WITH INTRAOPERATIVE CHOLANGIOGRAM;  Surgeon: Gaynelle Adu, MD;  Location: [facility] OR;  Service: General;  Laterality: N/A;  . Electrophysiologic study N/A 02/07/2015    Procedure: A-Flutter Ablation;  Surgeon: Marinus Maw, MD;  Location: [facility] INVASIVE CV LAB;  Service: Cardiovascular;  Laterality: N/A;  . Tonsillectomy    . Coronary angioplasty with stent placement    . Prostate biopsy  ~ 2013  . Insertion of dialysis catheter Right 06/13/2015    Procedure: INSERTION OF DIALYSIS CATHETER RIGHT INTERNAL JUGULAR;  Surgeon: Larina Earthly, MD;  Location: [facility] OR;  Service: Vascular;  Laterality: Right;  . Removal of a dialysis catheter Right 06/13/2015    Procedure:  REMOVAL OF A DIALYSIS CATHETER;  Surgeon: Larina Earthly, MD;  Location: [facility] OR;  Service: Vascular;  Laterality: Right;    Allergies  Allergies  Allergen Reactions  . Amoxicillin Swelling and Other (See Comments)    Puffy eyes and abdominal pain with Amoxicillin PO  . Lisinopril Other (See Comments)    Acute kidney injury  . Pravastatin Other (See Comments)    Weakness and fatigue  . Tamsulosin Other (See Comments)    REACTION: dry throat, sweating, blurred vision  . Doxycycline Rash and Other (See Comments)    Questionable drug rxn rash     History of Present Illness    70 year old male with prior history of CAD status post drug-eluting stent placement to the right coronary artery in 2006. He also has a history of hypertension, hyperlipidemia, end-stage renal disease, now on dialysis, and atrial flutter with unsuccessful ablation in 11 2016. He was admitted with heart failure in January 2017 and an echo showed an EF of 20-25% with moderate mitral regurgitation. Catheterization was not performed due to poor renal function and at that point, patient was not on dialysis. A nuclear study was performed in early May showing LV dysfunction and a large inferior apical and inferior lateral wall infarct without ischemia. He was last seen by Dr. Jens Som on May 19 with plan for diagnostic catheterization. This was scheduled for May 22, however when the patient arrived, his INR was elevated at 2.09 and the case was canceled. He presents today for rescheduling. He has been doing reasonably well and tolerating dialysis on Tuesdays, Thursdays, and Saturdays. He has not been having any chest pain or dyspnea however is not very active in the setting of a right lower extremity prosthesis but doesn't fit well and thus he is not really able to walk well. He denies PND, orthopnea, dizziness, syncope, edema, or early satiety.  Home Medications    Prior to Admission medications   Medication Sig Start Date End Date Taking? Authorizing Provider  amiodarone (PACERONE) 200 MG tablet Take 1 tablet (200 mg total) by mouth daily. 01/19/15  Yes Doneen Poisson, MD  calcium citrate-vitamin D (CITRACAL+D) 315-200 MG-UNIT per tablet Take 1 tablet by mouth daily.    Yes Historical Provider, MD  carvedilol (COREG) 6.25 MG tablet Take 1 tablet (6.25 mg total) by mouth 2 (two) times daily. 08/10/15  Yes Lewayne Bunting, MD  docusate sodium (COLACE) 100 MG capsule Take 100 mg by mouth at bedtime as needed for moderate constipation (constipation). Reported on 08/03/2015   Yes  Historical Provider, MD  famotidine (PEPCID) 20 MG tablet Take 20 mg before breakfast and 20 mg before supper 04/16/15  Yes Rhonda G Barrett, PA-C  fish oil-omega-3 fatty acids 1000 MG capsule Take 1 g by mouth daily. 01/15/12  Yes Doneen Poisson, MD  fluticasone (FLONASE) 50 MCG/ACT nasal spray Place 1 spray into both nostrils daily as needed for allergies. 01/19/15  Yes Doneen Poisson, MD  hydroxypropyl methylcellulose (ISOPTO TEARS) 2.5 % ophthalmic solution Place 2 drops into both eyes 4 (four) times daily as needed for dry eyes. Reported on 04/20/2015   Yes Historical Provider, MD  levothyroxine (SYNTHROID, LEVOTHROID) 25 MCG tablet Take 1 tablet (25 mcg total) by mouth daily before breakfast. 06/01/15  Yes Doneen Poisson, MD  Multiple Vitamins-Minerals (MULTIVITAMIN WITH MINERALS) tablet Take 1 tablet by mouth daily.   Yes Historical Provider, MD  ondansetron (ZOFRAN) 4 MG tablet Take 1 tablet (4 mg total) by mouth every 8 (  eight) hours as needed for nausea or vomiting. 06/01/15  Yes Selina CooleyKyle Flores, MD  warfarin (COUMADIN) 2.5 MG tablet Take 1&1/2 tablets on Tuesdays/Thursdays/Saturdays; all other days, take 1 tablet. 07/11/15  Yes Levert FeinsteinJames M Granfortuna, MD    Family History    Family History  Problem Relation Age of Onset  . Hypertension Mother   . Heart attack Father   . Breast cancer Sister   . Arthritis Sister   . Heart attack Brother   . Diabetes Brother   . Stroke Brother   . Pneumonia Daughter   . Diabetes Brother   . Alcoholism Brother   . Diabetes Brother   . Arthritis Sister     Bilateral knee replacement  . HIV Daughter   . Hypertension Daughter   . Drug abuse Daughter   . Schizophrenia Daughter   . Hypertension Daughter   . Drug abuse Daughter   . Bipolar disorder Daughter     Social History    Social History   Social History  . Marital Status: Married    Spouse Name: N/A  . Number of Children: N/A  . Years of Education: N/A   Occupational History  . Not on  file.   Social History Main Topics  . Smoking status: Former Smoker -- 1.00 packs/day for 10 years    Types: Cigarettes    Quit date: 06/16/1968  . Smokeless tobacco: Never Used  . Alcohol Use: No     Comment: "quit alcohol in the 1960's"  . Drug Use: No  . Sexual Activity: Not Currently    Birth Control/ Protection: None   Other Topics Concern  . Not on file   Social History Narrative     Review of Systems    General:  No chills, fever, night sweats or weight changes.  Cardiovascular:  No chest pain, dyspnea on exertion, edema, orthopnea, palpitations, paroxysmal nocturnal dyspnea. Dermatological: No rash, lesions/masses Respiratory: No cough, dyspnea Urologic: No hematuria, dysuria Abdominal:   No nausea, vomiting, diarrhea, bright red blood per rectum, melena, or hematemesis Neurologic:  No visual changes, wkns, changes in mental status. All other systems reviewed and are otherwise negative except as noted above.  Physical Exam    VS:  BP 135/79 mmHg  Pulse 68  Ht 5\' 8"  (1.727 m)  Wt 165 lb (74.844 kg)  BMI 25.09 kg/m2 , BMI Body mass index is 25.09 kg/(m^2). GEN: Well nourished, well developed, in no acute distress. HEENT: normal. Neck: Supple, no JVD, carotid bruits, or masses. Cardiac: RRR, no murmurs, rubs, or gallops. No clubbing, cyanosis, edema. Right lower extremity prosthesis. Radials/DP/PT 2+ on left. Respiratory:  Respirations regular and unlabored, clear to auscultation bilaterally. GI: Soft, nontender, nondistended, BS + x 4. MS: no deformity or atrophy. Skin: warm and dry, no rash. Neuro:  Strength and sensation are intact. Psych: Normal affect.  Accessory Clinical Findings    ECG - Regular sinus rhythm, 68, left axis, left anterior fascicular block, right bundle branch block with associated anteroseptal ST and T changes. No acute changes.  Assessment & Plan   1.  Abnormal stress test/CAD: Patient was recently found to have new LV dysfunction  with subsequent abnormal stress test in May. He was set up for diagnostic catheterization however this was canceled secondary to an elevated INR on the day of his catheterization. He has not been having any chest pain or dyspnea though is not particularly active. He is interested in pursuing diagnostic catheterization. He is on Tuesday Thursday  Saturday dialysis and is anticoagulated with Coumadin in the setting of paroxysmal atrial flutter. We will check labs today and arrange for catheterization next week on a nondialysis day. He will hold his Coumadin 5 days in advance of catheterization. The patient understands that risks include but are not limited to stroke (1 in 1000), death (1 in 1000), kidney failure [usually temporary] (1 in 500), bleeding (1 in 200), allergic reaction [possibly serious] (1 in 200), and agrees to proceed.    2. Cardiomyopathy: Presumed to be ischemic in the setting of history of CAD and abnormal stress test. He is euvolemic on exam. Volume management per dialysis/nephrology. He remains on beta blocker. He is not a candidate for ACE inhibitor, ARB, hydralazine, nitrates, spriro, or ARNI, in the setting of renal disease and history of hypotension.  3. Hypertensive heart disease: Blood pressure stable today. As noted above, he previously had hypotension which required his carvedilol dose be reduced.  4.  Paroxysmal atrial flutter: He is in sinus today. He is anticoagulated with warfarin.  5. End-stage renal disease: He is currently receiving Tuesday Thursday Saturday dialysis through a dietetic catheter. Plan is for AV fistula placement once he is cleared from a cardiac standpoint. Catheterization pending to provide additional risk assessment.   6. Disposition: Follow-up in approximately 3-4 weeks after his catheterization.  Nicolasa Duckinghristopher Berge, NP 09/05/2015, 1:20 PM

## 2015-09-07 ENCOUNTER — Other Ambulatory Visit: Payer: Self-pay | Admitting: Internal Medicine

## 2015-09-10 ENCOUNTER — Ambulatory Visit: Payer: Medicare Other | Admitting: Hematology & Oncology

## 2015-09-10 ENCOUNTER — Ambulatory Visit: Payer: Medicare Other

## 2015-09-10 ENCOUNTER — Other Ambulatory Visit: Payer: Medicare Other

## 2015-09-14 ENCOUNTER — Telehealth: Payer: Self-pay | Admitting: Cardiology

## 2015-09-14 NOTE — Telephone Encounter (Signed)
New message   Pt is needing more information on the instructions about the lab order and the XRAY

## 2015-09-14 NOTE — Telephone Encounter (Signed)
Returned call to patient's wife.She stated she was confused where to take husband for lab work and chest xray before he has cath 09/21/15.Advised he can have lab work done at [facility] [address] and chest xray at [facility] at [address]Stated he will have done on Mon 09/17/15.

## 2015-09-17 ENCOUNTER — Ambulatory Visit
Admission: RE | Admit: 2015-09-17 | Discharge: 2015-09-17 | Disposition: A | Discharge status: Home / Self Care | Payer: Medicare Other | Source: Ambulatory Visit | Attending: Cardiology | Admitting: Cardiology

## 2015-09-17 ENCOUNTER — Ambulatory Visit: Payer: Medicare Other | Admitting: Internal Medicine

## 2015-09-17 ENCOUNTER — Telehealth: Payer: Self-pay | Admitting: Cardiology

## 2015-09-17 ENCOUNTER — Ambulatory Visit (INDEPENDENT_AMBULATORY_CARE_PROVIDER_SITE_OTHER): Payer: Medicare Other | Admitting: Pharmacist

## 2015-09-17 DIAGNOSIS — R112 Nausea with vomiting, unspecified: Secondary | ICD-10-CM

## 2015-09-17 DIAGNOSIS — Z01818 Encounter for other preprocedural examination: Secondary | ICD-10-CM

## 2015-09-17 DIAGNOSIS — Z7901 Long term (current) use of anticoagulants: Secondary | ICD-10-CM

## 2015-09-17 DIAGNOSIS — R9439 Abnormal result of other cardiovascular function study: Secondary | ICD-10-CM

## 2015-09-17 LAB — POCT INR: INR: 1.8

## 2015-09-17 MED ORDER — ONDANSETRON HCL 4 MG PO TABS: 4.0000 mg | ORAL_TABLET | Freq: Three times a day (TID) | ORAL | Status: DC | PRN

## 2015-09-17 MED ORDER — WARFARIN SODIUM 2.5 MG PO TABS: ORAL_TABLET | ORAL | Status: DC

## 2015-09-17 NOTE — Progress Notes (Signed)
Anti-Coagulation Progress Note  Derrek GuWilliam R Tindol is a 70 y.o. male who is currently on an anti-coagulation regimen.    RECENT RESULTS: Recent results are below, the most recent result is correlated with a dose of 15 mg. Per--which is inclusive of the "hold" period defined by his cardiologist which commenced after his dose administered on Saturday 24-JUN-17 (which was LAST DOSE). Lab Results  Component Value Date   INR 1.80 09/17/2015   INR 2.90 08/27/2015   INR 2.09* 08/13/2015    ANTI-COAG DOSE: Anticoagulation Dose Instructions as of 09/17/2015      Glynis SmilesSun Mon Tue Wed Thu Fri Sat   New Dose 0 mg 0 mg 0 mg 0 mg 0 mg 0 mg 2.5 mg    Description        AFTER procedure scheduled for 30-JUN-17, RESUME warfarin on Saturday 1-JUL-17 as follows:  Saturdays 1 tablet; Sundays 1 tablet; Mondays 1&1/2 tablets; Tuesdays 1 tablet; Wednesdays 1 tablet; Thursdays 1 tablet; Fridays 1 tablet--then, repeat this sequence until you see me in clinic on Monday 10-JUL-17.        ANTICOAG SUMMARY: Anticoagulation Episode Summary    Current INR goal 2.0-3.0   Next INR check 10/01/2015   INR from last check 1.80! (09/17/2015)   Weekly max dose    Target end date    INR check location    Preferred lab    Send INR reminders to       Comments         ANTICOAG TODAY: Anticoagulation Summary as of 09/17/2015    INR goal 2.0-3.0   Selected INR 1.80! (09/17/2015)   Next INR check 10/01/2015   Target end date     Anticoagulation Episode Summary    INR check location    Preferred lab    Send INR reminders to    Comments       PATIENT INSTRUCTIONS: Patient Instructions  Patient instructed to take medications as defined in the Anti-coagulation Track section of this encounter.  Patient instructed to OMIT today's dose. Continue to OMIT doses for rest of week until your heart procedure on Friday 30-JUN-17. On Saturday, 1-JUL-17, RESUME warfarin as follows:  AFTER procedure scheduled for 30-JUN-17, RESUME  warfarin on Saturday 1-JUL-17 as follows:  Saturdays 1 tablet; Sundays 1 tablet; Mondays 1&1/2 tablets; Tuesdays 1 tablet; Wednesdays 1 tablet; Thursdays 1 tablet; Fridays 1 tablet--then, repeat this sequence until you see me in clinic on Monday 10-JUL-17.   Patient verbalized understanding of these instructions.       FOLLOW-UP Return in 2 weeks (on 10/01/2015) for Follow up INR at 1000h.  Hulen LusterJames Traivon Morrical, III Pharm.D., CACP

## 2015-09-17 NOTE — Patient Instructions (Addendum)
Patient instructed to take medications as defined in the Anti-coagulation Track section of this encounter.  Patient instructed to OMIT today's dose. Continue to OMIT doses for rest of week until your heart procedure on Friday 30-JUN-17. On Saturday, 1-JUL-17, RESUME warfarin as follows:  AFTER procedure scheduled for 30-JUN-17, RESUME warfarin on Saturday 1-JUL-17 as follows:  Saturdays 1 tablet; Sundays 1 tablet; Mondays 1&1/2 tablets; Tuesdays 1 tablet; Wednesdays 1 tablet; Thursdays 1 tablet; Fridays 1 tablet--then, repeat this sequence until you see me in clinic on Monday 10-JUL-17.   Patient verbalized understanding of these instructions.

## 2015-09-17 NOTE — Telephone Encounter (Signed)
New Message  [facility] has pt in the office to get a chest xray. No order was found and needs one to be faxed. Please call back to discuss

## 2015-09-17 NOTE — Telephone Encounter (Signed)
Left message for GI, order placed in [facility]. They are to call if anything else needed.

## 2015-09-18 LAB — CBC
HCT: 35.9 % — ABNORMAL LOW (ref 38.5–50.0)
Hemoglobin: 11.1 g/dL — ABNORMAL LOW (ref 13.2–17.1)
MCH: 24.3 pg — AB (ref 27.0–33.0)
MCHC: 30.9 g/dL — AB (ref 32.0–36.0)
MCV: 78.7 fL — ABNORMAL LOW (ref 80.0–100.0)
RBC: 4.56 MIL/uL (ref 4.20–5.80)
RDW: 16 % — AB (ref 11.0–15.0)
WBC: 4.9 10*3/uL (ref 3.8–10.8)

## 2015-09-18 LAB — TSH: TSH: 3.84 m[IU]/L (ref 0.40–4.50)

## 2015-09-18 LAB — COMPLETE METABOLIC PANEL WITH GFR
ALT: 25 U/L (ref 9–46)
AST: 39 U/L — ABNORMAL HIGH (ref 10–35)
Albumin: 2.8 g/dL — ABNORMAL LOW (ref 3.6–5.1)
Alkaline Phosphatase: 69 U/L (ref 40–115)
BUN: 46 mg/dL — ABNORMAL HIGH (ref 7–25)
CHLORIDE: 101 mmol/L (ref 98–110)
CO2: 24 mmol/L (ref 20–31)
Calcium: 7.7 mg/dL — ABNORMAL LOW (ref 8.6–10.3)
Creat: 4.09 mg/dL — ABNORMAL HIGH (ref 0.70–1.18)
GFR, EST AFRICAN AMERICAN: 16 mL/min — AB (ref >=60)
GFR, EST NON AFRICAN AMERICAN: 14 mL/min — AB (ref >=60)
GLUCOSE: 161 mg/dL — AB (ref 65–99)
POTASSIUM: 3.8 mmol/L (ref 3.5–5.3)
SODIUM: 138 mmol/L (ref 135–146)
Total Bilirubin: 0.5 mg/dL (ref 0.2–1.2)
Total Protein: 6.8 g/dL (ref 6.1–8.1)

## 2015-09-18 LAB — APTT: APTT: 37 s — AB (ref 22–34)

## 2015-09-18 LAB — PROTIME-INR
INR: 1.7 — AB
Prothrombin Time: 17.6 s — ABNORMAL HIGH (ref 9.0–11.5)

## 2015-09-19 ENCOUNTER — Ambulatory Visit (INDEPENDENT_AMBULATORY_CARE_PROVIDER_SITE_OTHER): Payer: Medicare Other | Admitting: Internal Medicine

## 2015-09-19 ENCOUNTER — Encounter: Payer: Self-pay | Admitting: Internal Medicine

## 2015-09-19 VITALS — BP 114/79 | HR 65 | Temp 98.2°F | Wt 166.4 lb

## 2015-09-19 VITALS — BP 160/89 | HR 61 | Temp 97.9°F | Ht 68.0 in

## 2015-09-19 DIAGNOSIS — Z992 Dependence on renal dialysis: Secondary | ICD-10-CM

## 2015-09-19 DIAGNOSIS — E1151 Type 2 diabetes mellitus with diabetic peripheral angiopathy without gangrene: Secondary | ICD-10-CM

## 2015-09-19 DIAGNOSIS — N186 End stage renal disease: Secondary | ICD-10-CM | POA: Diagnosis not present

## 2015-09-19 DIAGNOSIS — B182 Chronic viral hepatitis C: Secondary | ICD-10-CM

## 2015-09-19 DIAGNOSIS — D696 Thrombocytopenia, unspecified: Secondary | ICD-10-CM

## 2015-09-19 DIAGNOSIS — E1122 Type 2 diabetes mellitus with diabetic chronic kidney disease: Secondary | ICD-10-CM

## 2015-09-19 DIAGNOSIS — Z7901 Long term (current) use of anticoagulants: Secondary | ICD-10-CM

## 2015-09-19 DIAGNOSIS — N401 Enlarged prostate with lower urinary tract symptoms: Secondary | ICD-10-CM

## 2015-09-19 DIAGNOSIS — Z89611 Acquired absence of right leg above knee: Secondary | ICD-10-CM

## 2015-09-19 DIAGNOSIS — R351 Nocturia: Secondary | ICD-10-CM

## 2015-09-19 DIAGNOSIS — I25118 Atherosclerotic heart disease of native coronary artery with other forms of angina pectoris: Secondary | ICD-10-CM

## 2015-09-19 DIAGNOSIS — Z7982 Long term (current) use of aspirin: Secondary | ICD-10-CM

## 2015-09-19 MED ORDER — ELBASVIR-GRAZOPREVIR 50-100 MG PO TABS
1.0000 | ORAL_TABLET | Freq: Every day | ORAL | Status: DC
Start: 2015-09-19 — End: 2016-03-03

## 2015-09-19 NOTE — Patient Instructions (Addendum)
Date 09/19/2015  Dear Mr. Hilscher, As discussed in the ID Clinic, your hepatitis C therapy will include the following medications:          Zepatier (elbasvir 50 mg/grazoprevir 100 mg) for 12 weeks                Please note that ALL MEDICATIONS WILL START ON THE SAME DATE for a total of 12 weeks. ---------------------------------------------------------------- Your HCV Treatment Start Date: TBA   Your HCV genotype:  1a    Liver Fibrosis: TBD    ---------------------------------------------------------------- YOUR PHARMACY CONTACT:   [facility] [address] Phone: [phone] Hours: Monday to Friday 7:30 am to 6:00 pm   Please always contact your pharmacy at least 3-4 business days before you run out of medications to ensure your next month's medication is ready or 1 week prior to running out if you receive it by mail.  Remember, each prescription is for 28 days. ---------------------------------------------------------------- GENERAL NOTES REGARDING YOUR HEPATITIS C MEDICATION:  ZEPATIER is available as a beige-colored, oval-shaped, film-coated tablet debossed with "770" on one side and plain on the other. Each tablet contains 50 mg elbasvir and 100 mg grazoprevir.  Common side effects of ZEPATIER when used without ribavirin include: - feeling tired -trouble sleeping - headache -diarrhea - nausea  Common side effects of ZEPATIER when used with ribavirin include: - low red blood cell counts (anemia) - feeling irritable - headache - stomach pain - feeling tired - depression - shortness of breath - joint pain - rash or itching  Please note that this only lists the most common side effects and is NOT a comprehensive list of the potential side effects of these medications. For more information, please review the drug information sheets that come with your medication package from the pharmacy.   ---------------------------------------------------------------- GENERAL HELPFUL HINTS ON HCV THERAPY: 1. Stay well-hydrated. 2. Notify the ID Clinic of any changes in your other over-the-counter/herbal or prescription medications. 3. If you miss a dose of your medication, take the missed dose as soon as you remember. Return to your regular time/dose schedule the next day.  4.  Do not stop taking your medications without first talking with your healthcare provider. 5.  You may take Tylenol (acetaminophen), as long as the dose is less than 2000 mg (OR no more than 4 tablets of the Tylenol Extra Strengths 500mg  tablet) in 24 hours. 6.  You will see our pharmacist-specialist within the first 2 weeks of starting your medication. 7.  You will need to obtain routine labs around week 4 and12 weeks after starting and then 3 to 6 months after finishing Zepatier.    Staci RighterOMER, ROBERT, MD  [facility] [address] [place], [place]  [id number] [phone]

## 2015-09-19 NOTE — Progress Notes (Signed)
[facility]   CC: consideration for treatment for chronic hepatitis C  HPI:  +[name] [name] is a 70 y.o. male who presents for initial evaluation and management of chronic hepatitis C.  Patient tested positive earlier this year. Hepatitis C-associated risk factors present are: none. Patient denies history of blood transfusion, IV drug abuse, multiple sexual partners. Patient has had other studies performed. Results: hepatitis C RNA by PCR, result: positive. Patient has not had prior treatment for Hepatitis C. Patient does not have a past history of liver disease. Patient does not have a family history of liver disease. Patient does not  have associated signs or symptoms related to liver disease.  Labs reviewed and confirm chronic hepatitis C with a positive viral load.   Records reviewed from PCP.  Has ESRD recently on dialysis.  Has cath and getting a fistula placed this week.        Patient does not have documented immunity to Hepatitis A. Patient does have documented immunity to Hepatitis B.    Review of Systems:  Constitutional: negative for fatigue and malaise Gastrointestinal: negative for diarrhea Musculoskeletal: negative for myalgias and arthralgias All other systems reviewed and are negative      Past Medical History  Diagnosis Date  . Hyperlipidemia 04/02/2006  . Hypertensive heart disease 12/09/2006  . Benign prostatic hypertrophy with nocturia 07/07/2006  . Chronic venous insufficiency 04/12/2010  . Obesity (BMI 30.0-34.9) 01/15/2012  . Constipation 05/25/2009    Intermittent   . Microcytic normochromic anemia 05/27/2006  . Internal and external hemorrhoids without complication 08/20/2012  . Coronary artery disease 04/02/2006    a. s/p RCA DES 2006;  b. low risk myoview in 2011; c. 5/17 MV: EF 28%, large inf apical and inflat infarct w/o ischemia.  . 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 Type 2 diabetes mellitus with peripheral artery disease (HCC) 05/27/2006    Absent pulses in the left foot   . Closed displaced fracture of left femoral neck (HCC) 06/14/2014    s/p left hip hemiarthroplasty June 14, 2014   . Atrial flutter (HCC) 07/26/2014    a. May 2016, CHA2DS2VASc = 4 -> Eliquis, spontaneous conversion to NSR;  b. On amio;  c. 01/2015 EPS: Unable to induce right sided Aflutter. Non-sustained Afib and Left sided Aflutter noted.  [name] [name]. GERD (gastroesophageal reflux disease)   . 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 History of blood transfusion 05/2014    "related to hip OR"  . Degenerative joint disease involving multiple joints 02/02/2007  . Arthritis     "fingers" (02/07/2015)  . End stage renal disease on dialysis [facility](HCC)     a. TTS Dialysis - currently through right chest diatek, pending AVF.  [name] [name]. Cardiomyopathy (HCC)     a. 07/2014 Echo: EF 50%; b. 03/2015 Ech: EF 20-25%; c. 07/2015 Echo: EF 30-35%, Gr1 DD, sev LVH.    Prior to Admission medications   Medication Sig Start Date End Date Taking? Authorizing Provider  amiodarone (PACERONE) 200 MG tablet Take 1 tablet (200 mg total) by mouth daily. 01/19/15  Yes Doneen PoissonLawrence Klima, MD  calcium citrate-vitamin D (CITRACAL+D) 315-200 MG-UNIT per tablet Take 1 tablet by mouth  daily.    Yes Historical Provider, MD  carvedilol (COREG) 6.25 MG tablet Take 1 tablet (6.25 mg total) by mouth 2 (two) times daily. 08/10/15  Yes [name] Bunting, MD  docusate sodium (COLACE) 100 MG capsule Take 100 mg by mouth at bedtime as needed for moderate constipation (constipation). Reported on 08/03/2015   Yes Historical Provider, MD  famotidine (PEPCID) 20 MG tablet Take 20 mg before breakfast and 20 mg before supper 04/16/15  Yes [name] G  Barrett, PA-C  fish oil-omega-3 fatty acids 1000 MG capsule Take 1 g by mouth daily. 01/15/12  Yes Doneen Poisson, MD  fluticasone (FLONASE) 50 MCG/ACT nasal spray Place 1 spray into both nostrils daily as needed for allergies. 01/19/15  Yes Doneen Poisson, MD  hydroxypropyl methylcellulose (ISOPTO TEARS) 2.5 % ophthalmic solution Place 2 drops into both eyes 4 (four) times daily as needed for dry eyes. Reported on 04/20/2015   Yes Historical Provider, MD  levothyroxine (SYNTHROID, LEVOTHROID) 25 MCG tablet Take 1 tablet (25 mcg total) by mouth daily before breakfast. 06/01/15  Yes Doneen Poisson, MD  Multiple Vitamins-Minerals (MULTIVITAMIN WITH MINERALS) tablet Take 1 tablet by mouth daily.   Yes Historical Provider, MD  ondansetron (ZOFRAN) 4 MG tablet Take 1 tablet (4 mg total) by mouth every 8 (eight) hours as needed for nausea or vomiting. 09/17/15  Yes [name] Spain, MD  Elbasvir-Grazoprevir (ZEPATIER) 50-100 MG TABS Take 1 tablet by mouth daily. 09/19/15   [name] Barefoot, MD  pravastatin (PRAVACHOL) 40 MG tablet Take 1 tablet (40 mg total) by mouth every evening. Patient not taking: Reported on 09/19/2015 09/05/15   [name] Bunting, MD  warfarin (COUMADIN) 2.5 MG tablet Take 1&1/2 tablets on Mondays; all other days, take 1 tablet. Patient not taking: Reported on 09/19/2015 09/17/15   [name] Spain, MD    Allergies  Allergen Reactions  . Amoxicillin Swelling and Other (See Comments)    Puffy eyes and abdominal pain with Amoxicillin PO  . Lisinopril Other (See Comments)    Acute kidney injury  . Pravastatin Other (See Comments)    Weakness and fatigue  . Tamsulosin Other (See Comments)    REACTION: dry throat, sweating, blurred vision  . Doxycycline Rash and Other (See Comments)    Questionable drug rxn rash    Social History  Substance Use Topics  . Smoking status: Former Smoker -- 1.00 packs/day for 10 years    Types: Cigarettes    Quit date: 06/16/1968  . Smokeless  tobacco: Never Used  . Alcohol Use: No     Comment: "quit alcohol in the 1960's"    Family History  Problem Relation Age of Onset  . Hypertension Mother   . Heart attack Father   . Breast cancer Sister   . Arthritis Sister   . Heart attack Brother   . Diabetes Brother   . Stroke Brother   . Pneumonia Daughter   . Diabetes Brother   . Alcoholism Brother   . Diabetes Brother   . Arthritis Sister     Bilateral knee replacement  . HIV Daughter   . Hypertension Daughter   . Drug abuse Daughter   . Schizophrenia Daughter   . Hypertension Daughter   . Drug abuse Daughter   . Bipolar disorder Daughter       Objective:  Constitutional: in no apparent distress and alert,  Filed Vitals:   09/19/15 1007  BP: 160/89  Pulse: 61  Temp: 97.9 F (36.6 C)  Eyes: anicteric Cardiovascular: Cor RRR and No murmurs Respiratory: CTA B; normal respiratory effort Gastrointestinal: Bowel sounds are normal, liver is not enlarged, spleen is not enlarged Musculoskeletal: peripheral pulses normal, no pedal edema, no clubbing or cyanosis Skin: negatives: no rash; no porphyria cutanea tarda Lymphatic: no cervical lymphadenopathy   Laboratory Genotype: No results found for: HCVGENOTYPE HCV viral load:  Lab Results  Component Value Date   HCVQUANT 4720000 12/11/2014   Lab Results  Component Value Date   WBC 4.9 09/17/2015   HGB 11.1* 09/17/2015   HCT 35.9* 09/17/2015   MCV 78.7* 09/17/2015   PLT SEE NOTE 09/17/2015    Lab Results  Component Value Date   CREATININE 4.09* 09/17/2015   BUN 46* 09/17/2015   NA 138 09/17/2015   K 3.8 09/17/2015   CL 101 09/17/2015   CO2 24 09/17/2015    Lab Results  Component Value Date   ALT 25 09/17/2015   AST 39* 09/17/2015   ALKPHOS 69 09/17/2015     Labs and history reviewed and show CHILD-PUGH A  5-6 points: Child class A 7-9 points: Child class B 10-15 points: Child class C  Lab Results  Component Value Date   INR 1.80  09/17/2015   BILITOT 0.5 09/17/2015   ALBUMIN 2.8* 09/17/2015     Assessment: New Patient with Chronic Hepatitis C genotype 1a, untreated.  I discussed with the patient the lab findings that confirm chronic hepatitis C as well as the natural history and progression of disease including about 30% of people who develop cirrhosis of the liver if left untreated and once cirrhosis is established there is a 2-7% risk per year of liver cancer and liver failure.  I discussed the importance of treatment and benefits in reducing the risk, even if significant liver fibrosis exists.   Plan: 1) Patient counseled extensively on limiting acetaminophen to no more than 2 grams daily, avoidance of alcohol. 2) Transmission discussed with patient including sexual transmission, sharing razors and toothbrush.   3) Will need referral to gastroenterology if concern for cirrhosis 4) Will need referral for substance abuse counseling: No.; Further work up to include urine drug screen  No. 5) Will prescribe Zepatier for 12 weeks which is compatible with ESRD.  Will start once approved by insurance.  6) Hepatitis A vaccine Yes.   7) Hepatitis B vaccine No. 8) Pneumovax vaccine if concern for cirrhosis 9) Further work up to include liver staging with elastography 10) NS5A test  Yes.   and no resistance noted 10) will follow up after starting medicaiton

## 2015-09-20 ENCOUNTER — Telehealth: Payer: Self-pay | Admitting: Pharmacy Technician

## 2015-09-20 NOTE — Assessment & Plan Note (Signed)
Assessment  He is tolerating his hemodialysis sessions much better now that they have more per size control of his fluid withdrawal. He also has benefited from a de-escalation of several of his antihypertensive medications.  Plan  We will continue hemodialysis as already arranged. He and his wife have not thought any further about renal transplantation at this time.

## 2015-09-20 NOTE — Assessment & Plan Note (Signed)
Assessment  He currently has no symptoms of angina that would be consistent with his underlying progressive coronary artery disease. This is on carvedilol 6.25 mg by mouth twice daily and aspirin 81 mg by mouth daily.  Plan  He is scheduled to undergo a coronary angiogram on June 30 to assess for obstructive disease as the cause of his worsening left ventricular systolic function. He is currently holding his Coumadin so this procedure can proceed. In the meantime we will continue to the carvedilol at 6.25 mg by mouth twice daily and aspirin 81 mg by mouth daily. We will reassess for symptoms consistent with angina as well as review the results and any interventions associated with the coronary angiogram on June 30.

## 2015-09-20 NOTE — Assessment & Plan Note (Signed)
Assessment  His active hepatitis C may be contributing to his thrombocytopenia through an immune thrombocytopenic mechanism. He is currently agreeable to treating his hepatitis C and this [facility] by Dr. Luciana Axeomer.  Plan  Continued treatment of his chronic hepatitis C in hopes of also improving his thrombocytopenia via Dr. [facility]omer [facility].

## 2015-09-20 NOTE — Progress Notes (Signed)
   Subjective:    Patient ID: [name] GuWilliam R Murdy, male    DOB: 12/22/1945, 70 y.o.   MRN: [id number]  HPI  [name] [name] is here for re-evaluation of his RLE prosthesis, CAD, chronic systolic heart failure, and hepatitis C. Please see the A&P for the status of the pt's chronic medical problems.  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 Over the last year he has lost 33 pounds. This is resulted in his right lower extremity prosthesis falling off. He can not even move from a sitting position into another position without the prosthesis slipping off. This therefore has limited his ability to get out and about and to do transfers. Examples of things that he used to be able to do that he can because his prosthesis no longer fits is drive a car or even use his wheelchair. Quite simply, without a new prosthesis his mobility and function are severely limited from his baseline.  Review of Systems  Constitutional: Positive for activity change. Negative for appetite change, fatigue and unexpected weight change.       His activity is limited by the non-functionality of his right lower extremity prostheses due to significant weight loss over the past year.  Respiratory: Negative for chest tightness, shortness of breath and wheezing.   Cardiovascular: Negative for chest pain, palpitations and leg swelling.  Gastrointestinal: Positive for nausea. Negative for vomiting, abdominal pain and diarrhea.      Objective:   Physical Exam  Constitutional: He is oriented to person, place, and time. He appears well-developed and well-nourished. No distress.  HENT:  Head: Normocephalic and atraumatic.  Eyes: Conjunctivae are normal. Right eye exhibits no discharge. Left eye exhibits no discharge. No scleral icterus.  Musculoskeletal: Normal range of motion. He exhibits no edema or tenderness.    Neurological: He is alert and oriented to person, place, and time. He exhibits normal muscle tone.  Skin: Skin is warm and dry. No rash noted. He is not diaphoretic. No erythema.  Psychiatric: He has a normal mood and affect. His behavior is normal. Judgment and thought content normal.  Nursing note and vitals reviewed.     Assessment & Plan:   Please see problem based charting.

## 2015-09-20 NOTE — Assessment & Plan Note (Addendum)
Assessment  We did not review his glycemic control today. Instead we addressed the sequela of his peripheral artery disease secondary to his type 2 diabetes. 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 As noted above this has seriously limited his ability to get out and about and perform the chores around the house he would like to do. He therefore is asking for initiation of the necessary paperwork to be reevaluated for a prosthesis that fits much better and will improve his functional status.  Plan  We have assessed the need for a new prosthesis given the lack of fit currently, as well as the negative impact it has on his quality of life and functionality. We will initiate the necessary paperwork to get this evaluation done. The bottom line is that he is in need of a new right lower extremity prosthesis in order to become more functional because his current prosthesis no longer fits with his weight loss.  At the follow-up visit we will reassess his glycemic control by reviewing his blood sugar log given the hemoglobin A1c inaccuracies associated with patients on hemodialysis.

## 2015-09-20 NOTE — Patient Instructions (Signed)
It was great to see you again today.  1) We will continue to try to initiate the necessary paperwork and processes to have re-evaluation of your right lower extremity prostheses as we attempt to get this refitted so you can return to your previous functional status.  2) Continue you medications as you are.  We will see you back in 3 months, sooner if necessary.

## 2015-09-20 NOTE — Assessment & Plan Note (Signed)
Assessment  His post dialysis hypotension has improved with discontinuance of his outflow blockade. There is likely a contribution to more precise volume control and dialysis also contributing to his decrease in symptoms. As he is no longer making urine he does not require alpha blockade for his prostate disease.  Plan  We'll continue holding any therapy for his BPH given the complications he has experience with alpha blockade in the past, as well as the fact that he no longer is making significant amounts of urine.

## 2015-09-21 ENCOUNTER — Encounter (HOSPITAL_COMMUNITY)
Admission: RE | Disposition: A | Discharge status: Home / Self Care | Payer: Self-pay | Source: Ambulatory Visit | Attending: Internal Medicine

## 2015-09-21 ENCOUNTER — Encounter (HOSPITAL_COMMUNITY): Payer: Self-pay | Admitting: Cardiology

## 2015-09-21 ENCOUNTER — Ambulatory Visit (HOSPITAL_COMMUNITY)
Admission: RE | Admit: 2015-09-21 | Discharge: 2015-09-21 | Disposition: A | Discharge status: Home / Self Care | Payer: Medicare Other | Source: Ambulatory Visit | Attending: Internal Medicine | Admitting: Internal Medicine

## 2015-09-21 DIAGNOSIS — D509 Iron deficiency anemia, unspecified: Secondary | ICD-10-CM | POA: Insufficient documentation

## 2015-09-21 DIAGNOSIS — I132 Hypertensive heart and chronic kidney disease with heart failure and with stage 5 chronic kidney disease, or end stage renal disease: Secondary | ICD-10-CM | POA: Diagnosis not present

## 2015-09-21 DIAGNOSIS — Z8781 Personal history of (healed) traumatic fracture: Secondary | ICD-10-CM | POA: Diagnosis not present

## 2015-09-21 DIAGNOSIS — Z880 Allergy status to penicillin: Secondary | ICD-10-CM | POA: Diagnosis not present

## 2015-09-21 DIAGNOSIS — I420 Dilated cardiomyopathy: Secondary | ICD-10-CM | POA: Diagnosis present

## 2015-09-21 DIAGNOSIS — M1990 Unspecified osteoarthritis, unspecified site: Secondary | ICD-10-CM | POA: Diagnosis not present

## 2015-09-21 DIAGNOSIS — E785 Hyperlipidemia, unspecified: Secondary | ICD-10-CM | POA: Insufficient documentation

## 2015-09-21 DIAGNOSIS — N186 End stage renal disease: Secondary | ICD-10-CM | POA: Insufficient documentation

## 2015-09-21 DIAGNOSIS — I25118 Atherosclerotic heart disease of native coronary artery with other forms of angina pectoris: Secondary | ICD-10-CM | POA: Diagnosis present

## 2015-09-21 DIAGNOSIS — Z7901 Long term (current) use of anticoagulants: Secondary | ICD-10-CM | POA: Insufficient documentation

## 2015-09-21 DIAGNOSIS — E1151 Type 2 diabetes mellitus with diabetic peripheral angiopathy without gangrene: Secondary | ICD-10-CM | POA: Diagnosis not present

## 2015-09-21 DIAGNOSIS — I10 Essential (primary) hypertension: Secondary | ICD-10-CM | POA: Diagnosis present

## 2015-09-21 DIAGNOSIS — I480 Paroxysmal atrial fibrillation: Secondary | ICD-10-CM | POA: Diagnosis not present

## 2015-09-21 DIAGNOSIS — Z8249 Family history of ischemic heart disease and other diseases of the circulatory system: Secondary | ICD-10-CM | POA: Diagnosis not present

## 2015-09-21 DIAGNOSIS — Z992 Dependence on renal dialysis: Secondary | ICD-10-CM | POA: Diagnosis not present

## 2015-09-21 DIAGNOSIS — I2584 Coronary atherosclerosis due to calcified coronary lesion: Secondary | ICD-10-CM | POA: Diagnosis not present

## 2015-09-21 DIAGNOSIS — E669 Obesity, unspecified: Secondary | ICD-10-CM | POA: Insufficient documentation

## 2015-09-21 DIAGNOSIS — Z96642 Presence of left artificial hip joint: Secondary | ICD-10-CM | POA: Insufficient documentation

## 2015-09-21 DIAGNOSIS — I509 Heart failure, unspecified: Secondary | ICD-10-CM | POA: Diagnosis not present

## 2015-09-21 DIAGNOSIS — E1142 Type 2 diabetes mellitus with diabetic polyneuropathy: Secondary | ICD-10-CM | POA: Diagnosis not present

## 2015-09-21 DIAGNOSIS — E1139 Type 2 diabetes mellitus with other diabetic ophthalmic complication: Secondary | ICD-10-CM | POA: Diagnosis not present

## 2015-09-21 DIAGNOSIS — Z955 Presence of coronary angioplasty implant and graft: Secondary | ICD-10-CM | POA: Insufficient documentation

## 2015-09-21 DIAGNOSIS — K219 Gastro-esophageal reflux disease without esophagitis: Secondary | ICD-10-CM | POA: Insufficient documentation

## 2015-09-21 DIAGNOSIS — I2582 Chronic total occlusion of coronary artery: Secondary | ICD-10-CM | POA: Diagnosis not present

## 2015-09-21 DIAGNOSIS — Z833 Family history of diabetes mellitus: Secondary | ICD-10-CM | POA: Diagnosis not present

## 2015-09-21 DIAGNOSIS — E1122 Type 2 diabetes mellitus with diabetic chronic kidney disease: Secondary | ICD-10-CM | POA: Insufficient documentation

## 2015-09-21 DIAGNOSIS — I4892 Unspecified atrial flutter: Secondary | ICD-10-CM | POA: Insufficient documentation

## 2015-09-21 DIAGNOSIS — Z89611 Acquired absence of right leg above knee: Secondary | ICD-10-CM | POA: Insufficient documentation

## 2015-09-21 DIAGNOSIS — Z803 Family history of malignant neoplasm of breast: Secondary | ICD-10-CM | POA: Insufficient documentation

## 2015-09-21 DIAGNOSIS — Z6825 Body mass index (BMI) 25.0-25.9, adult: Secondary | ICD-10-CM | POA: Diagnosis not present

## 2015-09-21 DIAGNOSIS — R9439 Abnormal result of other cardiovascular function study: Secondary | ICD-10-CM

## 2015-09-21 DIAGNOSIS — Z87891 Personal history of nicotine dependence: Secondary | ICD-10-CM | POA: Insufficient documentation

## 2015-09-21 DIAGNOSIS — I2510 Atherosclerotic heart disease of native coronary artery without angina pectoris: Secondary | ICD-10-CM | POA: Insufficient documentation

## 2015-09-21 DIAGNOSIS — I130 Hypertensive heart and chronic kidney disease with heart failure and stage 1 through stage 4 chronic kidney disease, or unspecified chronic kidney disease: Secondary | ICD-10-CM

## 2015-09-21 DIAGNOSIS — I872 Venous insufficiency (chronic) (peripheral): Secondary | ICD-10-CM | POA: Insufficient documentation

## 2015-09-21 DIAGNOSIS — I340 Nonrheumatic mitral (valve) insufficiency: Secondary | ICD-10-CM | POA: Insufficient documentation

## 2015-09-21 HISTORY — PX: CARDIAC CATHETERIZATION: SHX172

## 2015-09-21 LAB — GLUCOSE, CAPILLARY
GLUCOSE-CAPILLARY: 163 mg/dL — AB (ref 65–99)
GLUCOSE-CAPILLARY: 174 mg/dL — AB (ref 65–99)

## 2015-09-21 LAB — PROTIME-INR
INR: 1.3 (ref 0.00–1.49)
Prothrombin Time: 16.4 seconds — ABNORMAL HIGH (ref 11.6–15.2)

## 2015-09-21 LAB — CBC
HEMATOCRIT: 37 % — AB (ref 39.0–52.0)
Hemoglobin: 11.5 g/dL — ABNORMAL LOW (ref 13.0–17.0)
MCH: 24 pg — AB (ref 26.0–34.0)
MCHC: 31.1 g/dL (ref 30.0–36.0)
MCV: 77.2 fL — AB (ref 78.0–100.0)
PLATELETS: 66 10*3/uL — AB (ref 150–400)
RBC: 4.79 MIL/uL (ref 4.22–5.81)
RDW: 14.6 % (ref 11.5–15.5)
WBC: 5.5 10*3/uL (ref 4.0–10.5)

## 2015-09-21 SURGERY — LEFT HEART CATH AND CORONARY ANGIOGRAPHY

## 2015-09-21 MED ORDER — IOPAMIDOL (ISOVUE-370) INJECTION 76%
INTRAVENOUS | Status: DC | PRN
  Administered 2015-09-21: 90 mL via INTRAVENOUS

## 2015-09-21 MED ORDER — ONDANSETRON HCL 4 MG/2ML IJ SOLN: 4.0000 mg | Freq: Four times a day (QID) | INTRAMUSCULAR | Status: DC | PRN

## 2015-09-21 MED ORDER — HYDRALAZINE HCL 20 MG/ML IJ SOLN
INTRAMUSCULAR | Status: AC
  Filled 2015-09-21: qty 1

## 2015-09-21 MED ORDER — SODIUM CHLORIDE 0.9% FLUSH: 3.0000 mL | INTRAVENOUS | Status: DC | PRN

## 2015-09-21 MED ORDER — CARVEDILOL 3.125 MG PO TABS: 6.2500 mg | ORAL_TABLET | Freq: Two times a day (BID) | ORAL | Status: DC

## 2015-09-21 MED ORDER — LIDOCAINE HCL (PF) 1 % IJ SOLN
INTRAMUSCULAR | Status: AC
  Filled 2015-09-21: qty 30

## 2015-09-21 MED ORDER — LIDOCAINE HCL (PF) 1 % IJ SOLN
INTRAMUSCULAR | Status: DC | PRN
  Administered 2015-09-21: 18 mL via INTRADERMAL

## 2015-09-21 MED ORDER — FENTANYL CITRATE (PF) 100 MCG/2ML IJ SOLN
INTRAMUSCULAR | Status: AC
Start: 2015-09-21 — End: 2015-09-21
  Filled 2015-09-21: qty 2

## 2015-09-21 MED ORDER — ACETAMINOPHEN 325 MG PO TABS: 650.0000 mg | ORAL_TABLET | ORAL | Status: DC | PRN

## 2015-09-21 MED ORDER — HYDRALAZINE HCL 20 MG/ML IJ SOLN
INTRAMUSCULAR | Status: DC | PRN
  Administered 2015-09-21 (×2): 10 mg via INTRAVENOUS

## 2015-09-21 MED ORDER — MORPHINE SULFATE (PF) 2 MG/ML IV SOLN: 2.0000 mg | INTRAVENOUS | Status: DC | PRN

## 2015-09-21 MED ORDER — SODIUM CHLORIDE 0.9 % IV SOLN: 250.0000 mL | INTRAVENOUS | Status: DC | PRN

## 2015-09-21 MED ORDER — SODIUM CHLORIDE 0.9% FLUSH: 3.0000 mL | Freq: Two times a day (BID) | INTRAVENOUS | Status: DC

## 2015-09-21 MED ORDER — SODIUM CHLORIDE 0.9 % IV SOLN
INTRAVENOUS | Status: DC
  Administered 2015-09-21: 07:00:00 via INTRAVENOUS

## 2015-09-21 MED ORDER — MIDAZOLAM HCL 2 MG/2ML IJ SOLN
INTRAMUSCULAR | Status: DC | PRN
  Administered 2015-09-21: 2 mg via INTRAVENOUS

## 2015-09-21 MED ORDER — MIDAZOLAM HCL 2 MG/2ML IJ SOLN
INTRAMUSCULAR | Status: AC
  Filled 2015-09-21: qty 2

## 2015-09-21 MED ORDER — FENTANYL CITRATE (PF) 100 MCG/2ML IJ SOLN
INTRAMUSCULAR | Status: DC | PRN
  Administered 2015-09-21: 25 ug via INTRAVENOUS

## 2015-09-21 MED ORDER — IOPAMIDOL (ISOVUE-370) INJECTION 76%
INTRAVENOUS | Status: AC
Start: 2015-09-21 — End: 2015-09-21
  Filled 2015-09-21: qty 100

## 2015-09-21 MED ORDER — HEPARIN (PORCINE) IN NACL 2-0.9 UNIT/ML-% IJ SOLN
INTRAMUSCULAR | Status: DC | PRN
  Administered 2015-09-21: 1000 mL

## 2015-09-21 MED ORDER — HEPARIN (PORCINE) IN NACL 2-0.9 UNIT/ML-% IJ SOLN
INTRAMUSCULAR | Status: AC
  Filled 2015-09-21: qty 1000

## 2015-09-21 SURGICAL SUPPLY — 8 items
CATH INFINITI 5FR MULTPACK ANG (CATHETERS) ×2 IMPLANT
KIT HEART LEFT (KITS) ×3 IMPLANT
PACK CARDIAC CATHETERIZATION (CUSTOM PROCEDURE TRAY) ×3 IMPLANT
SHEATH PINNACLE 5F 10CM (SHEATH) ×2 IMPLANT
SYR MEDRAD MARK V 150ML (SYRINGE) ×3 IMPLANT
TRANSDUCER W/STOPCOCK (MISCELLANEOUS) ×3 IMPLANT
TUBING CIL FLEX 10 FLL-RA (TUBING) ×3 IMPLANT
WIRE EMERALD 3MM-J .035X150CM (WIRE) ×2 IMPLANT

## 2015-09-21 NOTE — Discharge Instructions (Signed)
Angiogram, Care After °Refer to this sheet in the next few weeks. These instructions provide you with information about caring for yourself after your procedure. Your health care provider may also give you more specific instructions. Your treatment has been planned according to current medical practices, but problems sometimes occur. Call your health care provider if you have any problems or questions after your procedure. °WHAT TO EXPECT AFTER THE PROCEDURE °After your procedure, it is typical to have the following: °· Bruising at the catheter insertion site that usually fades within 1-2 weeks. °· Blood collecting in the tissue (hematoma) that may be painful to the touch. It should usually decrease in size and tenderness within 1-2 weeks. °HOME CARE INSTRUCTIONS °· Take medicines only as directed by your health care provider. °· You may shower 24-48 hours after the procedure or as directed by your health care provider. Remove the bandage (dressing) and gently wash the site with plain soap and water. Pat the area dry with a clean towel. Do not rub the site, because this may cause bleeding. °· Do not take baths, swim, or use a hot tub until your health care provider approves. °· Check your insertion site every day for redness, swelling, or drainage. °· Do not apply powder or lotion to the site. °· Do not lift over 10 lb (4.5 kg) for 5 days after your procedure or as directed by your health care provider. °· Ask your health care provider when it is okay to: °¨ Return to work or school. °¨ Resume usual physical activities or sports. °¨ Resume sexual activity. °· Do not drive home if you are discharged the same day as the procedure. Have someone else drive you. °· You may drive 24 hours after the procedure unless otherwise instructed by your health care provider. °· Do not operate machinery or power tools for 24 hours after the procedure or as directed by your health care provider. °· If your procedure was done as an  outpatient procedure, which means that you went home the same day as your procedure, a responsible adult should be with you for the first 24 hours after you arrive home. °· Keep all follow-up visits as directed by your health care provider. This is important. °SEEK MEDICAL CARE IF: °· You have a fever. °· You have chills. °· You have increased bleeding from the catheter insertion site. Hold pressure on the site. °SEEK IMMEDIATE MEDICAL CARE IF: °· You have unusual pain at the catheter insertion site. °· You have redness, warmth, or swelling at the catheter insertion site. °· You have drainage (other than a small amount of blood on the dressing) from the catheter insertion site. °· The catheter insertion site is bleeding, and the bleeding does not stop after 30 minutes of holding steady pressure on the site. °· The area near or just beyond the catheter insertion site becomes pale, cool, tingly, or numb. °  °This information is not intended to replace advice given to you by your health care provider. Make sure you discuss any questions you have with your health care provider. °  °Document Released: 09/26/2004 Document Revised: 03/31/2014 Document Reviewed: 08/11/2012 °Elsevier Interactive Patient Education ©2016 [employer] ° °

## 2015-09-21 NOTE — Progress Notes (Addendum)
Site area: rt groin Site Prior to Removal:  Level  0 Pressure Applied For:  20 minutes Manual:   yes Patient Status During Pull:  stable Post Pull Site:  Level  0 Post Pull Instructions Given:  yes Post Pull Pulses Present: rt AKA Dressing Applied:  tegaderm Bedrest begins @  0905 Comments:   IV saline locked

## 2015-09-21 NOTE — Interval H&P Note (Signed)
History and Physical Interval Note:  09/21/2015 7:23 AM  [name] [name]  has presented today for surgery, with the diagnosis of abnormal stress test with Dx of Cardiomyopathy.   The various methods of treatment have been discussed with the patient and family. After consideration of risks, benefits and other options for treatment, the patient has consented to  Procedure(s): Left Heart Cath and Coronary Angiography (N/A) with possible percutaneous coronary intervention as a surgical intervention .  The patient's history has been reviewed, patient examined, no change in status, stable for surgery.  I have reviewed the patient's chart and labs.  Questions were answered to the patient's satisfaction.    Cath Lab Visit (complete for each Cath Lab visit)  Clinical Evaluation Leading to the Procedure:   ACS: No.  Non-ACS:    Anginal Classification: No Symptoms  Anti-ischemic medical therapy: Minimal Therapy (1 class of medications)  Non-Invasive Test Results: High-risk stress test findings: cardiac mortality >3%/year  Prior CABG: No previous CABG   Ischemic Symptoms? CCS II (Slight limitation of ordinary activity) Anti-ischemic Medical Therapy? Minimal Therapy (1 class of medications) Non-invasive Test Results? High-risk stress test findings: cardiac mortality >3%/yr Prior CABG? No Previous CABG   Patient Information:   1-2V CAD, no prox LAD  A (7)  Indication: 18; Score: 7   Patient Information:   CTO of 1 vessel, no other CAD  U (5)  Indication: 28; Score: 5   Patient Information:   1V CAD with prox LAD  A (8)  Indication: 34; Score: 8   Patient Information:   2V-CAD with prox LAD  A (8)  Indication: 40; Score: 8   Patient Information:   3V-CAD without LMCA  A (8)  Indication: 46; Score: 8   Patient Information:   3V-CAD without LMCA With Abnormal LV systolic function  A (9)  Indication: 48; Score: 9   Patient Information:   LMCA-CAD  A (9)   Indication: 49; Score: 9   Patient Information:   2V-CAD with prox LAD PCI  A (7)  Indication: 62; Score: 7   Patient Information:   2V-CAD with prox LAD CABG  A (8)  Indication: 62; Score: 8   Patient Information:   3V-CAD without LMCA With Low CAD burden(i.e., 3 focal stenoses, low SYNTAX score) PCI  A (7)  Indication: 63; Score: 7   Patient Information:   3V-CAD without LMCA With Low CAD burden(i.e., 3 focal stenoses, low SYNTAX score) CABG  A (9)  Indication: 63; Score: 9   Patient Information:   3V-CAD without LMCA E06c - Intermediate-high CAD burden (i.e., multiple diffuse lesions, presence of CTO, or high SYNTAX score) PCI  U (4)  Indication: 64; Score: 4   Patient Information:   3V-CAD without LMCA E06c - Intermediate-high CAD burden (i.e., multiple diffuse lesions, presence of CTO, or high SYNTAX score) CABG  A (9)  Indication: 64; Score: 9   Patient Information:   LMCA-CAD With Isolated LMCA stenosis  PCI  U (6)  Indication: 65; Score: 6   Patient Information:   LMCA-CAD With Isolated LMCA stenosis  CABG  A (9)  Indication: 65; Score: 9   Patient Information:   LMCA-CAD Additional CAD, low CAD burden (i.e., 1- to 2-vessel additional involvement, low SYNTAX score) PCI  U (5)  Indication: 66; Score: 5   Patient Information:   LMCA-CAD Additional CAD, low CAD burden (i.e., 1- to 2-vessel additional involvement, low SYNTAX score) CABG  A (9)  Indication: 66;  Score: 9   Patient Information:   LMCA-CAD Additional CAD, intermediate-high CAD burden (i.e., 3-vessel involvement, presence of CTO, or high SYNTAX score) PCI  I (3)  Indication: 67; Score: 3   Patient Information:   LMCA-CAD Additional CAD, intermediate-high CAD burden (i.e., 3-vessel involvement, presence of CTO, or high SYNTAX score) CABG  A (9)  Indication: 67; Score: 9   [name] [name]

## 2015-09-21 NOTE — H&P (View-Only) (Signed)
Cardiology Clinic Note   Patient Name: [name] [name] Date of Encounter: 09/05/2015  Primary Care Provider:  Rocco Serene, MD Primary Cardiologist:  B. Jens Som, MD   Patient Profile    70 y/o ? with a h/o CAD, HTN, HL, PAF, and ESRD, who presents for f/u to arrange diagnostic catheterization.  Past Medical History    Past Medical History  Diagnosis Date  . Hyperlipidemia 04/02/2006  . Hypertensive heart disease 12/09/2006  . Benign prostatic hypertrophy with nocturia 07/07/2006  . Chronic venous insufficiency 04/12/2010  . Obesity (BMI 30.0-34.9) 01/15/2012  . Constipation 05/25/2009    Intermittent   . Microcytic normochromic anemia 05/27/2006  . Internal and external hemorrhoids without complication 08/20/2012  . Coronary artery disease 04/02/2006    a. s/p RCA DES 2006;  b. low risk myoview in 2011; c. 5/17 MV: EF 28%, large inf apical and inflat infarct w/o ischemia.  . 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 Type 2 diabetes mellitus with peripheral artery disease (HCC) 05/27/2006    Absent pulses in the left foot   . Closed displaced fracture of left femoral neck (HCC) 06/14/2014    s/p left hip hemiarthroplasty June 14, 2014   . Atrial flutter (HCC) 07/26/2014    a. May 2016, CHA2DS2VASc = 4 -> Eliquis, spontaneous conversion to NSR;  b. On amio;  c. 01/2015 EPS: Unable to induce right sided Aflutter. Non-sustained Afib and Left sided Aflutter noted.  [name] GERD (gastroesophageal reflux disease)   . 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 History of blood transfusion 05/2014    "related to hip OR"  . Degenerative joint disease involving multiple joints 02/02/2007  . Arthritis     "fingers" (02/07/2015)    . End stage renal disease on dialysis [facility])     a. TTS Dialysis - currently through right chest diatek, pending AVF.  [name] Cardiomyopathy (HCC)     a. 07/2014 Echo: EF 50%; b. 03/2015 Ech: EF 20-25%; c. 07/2015 Echo: EF 30-35%, Gr1 DD, sev LVH.   Past Surgical History  Procedure Laterality Date  . Pilonidal cyst excision  1990's  . Refractive surgery Bilateral   . Fracture surgery    . Total hip arthroplasty Left 06/14/2014    Procedure: HEMI HIP ARTHROPLASTY ANTERIOR APPROACH;  Surgeon: Tarry Kos, MD;  Location: [facility] OR;  Service: Orthopedics;  Laterality: Left;  . Joint replacement    . Leg amputation above knee Right ~ 2008  . Cholecystectomy N/A 12/12/2014    Procedure: LAPAROSCOPIC CHOLECYSTECTOMY WITH INTRAOPERATIVE CHOLANGIOGRAM;  Surgeon: Gaynelle Adu, MD;  Location: [facility] OR;  Service: General;  Laterality: N/A;  . Electrophysiologic study N/A 02/07/2015    Procedure: A-Flutter Ablation;  Surgeon: Marinus Maw, MD;  Location: [facility] INVASIVE CV LAB;  Service: Cardiovascular;  Laterality: N/A;  . Tonsillectomy    . Coronary angioplasty with stent placement    . Prostate biopsy  ~ 2013  . Insertion of dialysis catheter Right 06/13/2015    Procedure: INSERTION OF DIALYSIS CATHETER RIGHT INTERNAL JUGULAR;  Surgeon: Larina Earthly, MD;  Location: [facility] OR;  Service: Vascular;  Laterality: Right;  . Removal of a dialysis catheter Right 06/13/2015    Procedure:  REMOVAL OF A DIALYSIS CATHETER;  Surgeon: Larina Earthly, MD;  Location: [facility] OR;  Service: Vascular;  Laterality: Right;    Allergies  Allergies  Allergen Reactions  . Amoxicillin Swelling and Other (See Comments)    Puffy eyes and abdominal pain with Amoxicillin PO  . Lisinopril Other (See Comments)    Acute kidney injury  . Pravastatin Other (See Comments)    Weakness and fatigue  . Tamsulosin Other (See Comments)    REACTION: dry throat, sweating, blurred vision  . Doxycycline Rash and Other (See Comments)    Questionable drug rxn rash     History of Present Illness    70 year old male with prior history of CAD status post drug-eluting stent placement to the right coronary artery in 2006. He also has a history of hypertension, hyperlipidemia, end-stage renal disease, now on dialysis, and atrial flutter with unsuccessful ablation in 11 2016. He was admitted with heart failure in January 2017 and an echo showed an EF of 20-25% with moderate mitral regurgitation. Catheterization was not performed due to poor renal function and at that point, patient was not on dialysis. A nuclear study was performed in early May showing LV dysfunction and a large inferior apical and inferior lateral wall infarct without ischemia. He was last seen by Dr. Jens Som on May 19 with plan for diagnostic catheterization. This was scheduled for May 22, however when the patient arrived, his INR was elevated at 2.09 and the case was canceled. He presents today for rescheduling. He has been doing reasonably well and tolerating dialysis on Tuesdays, Thursdays, and Saturdays. He has not been having any chest pain or dyspnea however is not very active in the setting of a right lower extremity prosthesis but doesn't fit well and thus he is not really able to walk well. He denies PND, orthopnea, dizziness, syncope, edema, or early satiety.  Home Medications    Prior to Admission medications   Medication Sig Start Date End Date Taking? Authorizing Provider  amiodarone (PACERONE) 200 MG tablet Take 1 tablet (200 mg total) by mouth daily. 01/19/15  Yes Doneen Poisson, MD  calcium citrate-vitamin D (CITRACAL+D) 315-200 MG-UNIT per tablet Take 1 tablet by mouth daily.    Yes Historical Provider, MD  carvedilol (COREG) 6.25 MG tablet Take 1 tablet (6.25 mg total) by mouth 2 (two) times daily. 08/10/15  Yes Lewayne Bunting, MD  docusate sodium (COLACE) 100 MG capsule Take 100 mg by mouth at bedtime as needed for moderate constipation (constipation). Reported on 08/03/2015   Yes  Historical Provider, MD  famotidine (PEPCID) 20 MG tablet Take 20 mg before breakfast and 20 mg before supper 04/16/15  Yes Rhonda G Barrett, PA-C  fish oil-omega-3 fatty acids 1000 MG capsule Take 1 g by mouth daily. 01/15/12  Yes Doneen Poisson, MD  fluticasone (FLONASE) 50 MCG/ACT nasal spray Place 1 spray into both nostrils daily as needed for allergies. 01/19/15  Yes Doneen Poisson, MD  hydroxypropyl methylcellulose (ISOPTO TEARS) 2.5 % ophthalmic solution Place 2 drops into both eyes 4 (four) times daily as needed for dry eyes. Reported on 04/20/2015   Yes Historical Provider, MD  levothyroxine (SYNTHROID, LEVOTHROID) 25 MCG tablet Take 1 tablet (25 mcg total) by mouth daily before breakfast. 06/01/15  Yes Doneen Poisson, MD  Multiple Vitamins-Minerals (MULTIVITAMIN WITH MINERALS) tablet Take 1 tablet by mouth daily.   Yes Historical Provider, MD  ondansetron (ZOFRAN) 4 MG tablet Take 1 tablet (4 mg total) by mouth every 8 (  eight) hours as needed for nausea or vomiting. 06/01/15  Yes Selina CooleyKyle Flores, MD  warfarin (COUMADIN) 2.5 MG tablet Take 1&1/2 tablets on Tuesdays/Thursdays/Saturdays; all other days, take 1 tablet. 07/11/15  Yes Levert FeinsteinJames M Granfortuna, MD    Family History    Family History  Problem Relation Age of Onset  . Hypertension Mother   . Heart attack Father   . Breast cancer Sister   . Arthritis Sister   . Heart attack Brother   . Diabetes Brother   . Stroke Brother   . Pneumonia Daughter   . Diabetes Brother   . Alcoholism Brother   . Diabetes Brother   . Arthritis Sister     Bilateral knee replacement  . HIV Daughter   . Hypertension Daughter   . Drug abuse Daughter   . Schizophrenia Daughter   . Hypertension Daughter   . Drug abuse Daughter   . Bipolar disorder Daughter     Social History    Social History   Social History  . Marital Status: Married    Spouse Name: N/A  . Number of Children: N/A  . Years of Education: N/A   Occupational History  . Not on  file.   Social History Main Topics  . Smoking status: Former Smoker -- 1.00 packs/day for 10 years    Types: Cigarettes    Quit date: 06/16/1968  . Smokeless tobacco: Never Used  . Alcohol Use: No     Comment: "quit alcohol in the 1960's"  . Drug Use: No  . Sexual Activity: Not Currently    Birth Control/ Protection: None   Other Topics Concern  . Not on file   Social History Narrative     Review of Systems    General:  No chills, fever, night sweats or weight changes.  Cardiovascular:  No chest pain, dyspnea on exertion, edema, orthopnea, palpitations, paroxysmal nocturnal dyspnea. Dermatological: No rash, lesions/masses Respiratory: No cough, dyspnea Urologic: No hematuria, dysuria Abdominal:   No nausea, vomiting, diarrhea, bright red blood per rectum, melena, or hematemesis Neurologic:  No visual changes, wkns, changes in mental status. All other systems reviewed and are otherwise negative except as noted above.  Physical Exam    VS:  BP 135/79 mmHg  Pulse 68  Ht 5\' 8"  (1.727 m)  Wt 165 lb (74.844 kg)  BMI 25.09 kg/m2 , BMI Body mass index is 25.09 kg/(m^2). GEN: Well nourished, well developed, in no acute distress. HEENT: normal. Neck: Supple, no JVD, carotid bruits, or masses. Cardiac: RRR, no murmurs, rubs, or gallops. No clubbing, cyanosis, edema. Right lower extremity prosthesis. Radials/DP/PT 2+ on left. Respiratory:  Respirations regular and unlabored, clear to auscultation bilaterally. GI: Soft, nontender, nondistended, BS + x 4. MS: no deformity or atrophy. Skin: warm and dry, no rash. Neuro:  Strength and sensation are intact. Psych: Normal affect.  Accessory Clinical Findings    ECG - Regular sinus rhythm, 68, left axis, left anterior fascicular block, right bundle branch block with associated anteroseptal ST and T changes. No acute changes.  Assessment & Plan   1.  Abnormal stress test/CAD: Patient was recently found to have new LV dysfunction  with subsequent abnormal stress test in May. He was set up for diagnostic catheterization however this was canceled secondary to an elevated INR on the day of his catheterization. He has not been having any chest pain or dyspnea though is not particularly active. He is interested in pursuing diagnostic catheterization. He is on Tuesday Thursday  Saturday dialysis and is anticoagulated with Coumadin in the setting of paroxysmal atrial flutter. We will check labs today and arrange for catheterization next week on a nondialysis day. He will hold his Coumadin 5 days in advance of catheterization. The patient understands that risks include but are not limited to stroke (1 in 1000), death (1 in 1000), kidney failure [usually temporary] (1 in 500), bleeding (1 in 200), allergic reaction [possibly serious] (1 in 200), and agrees to proceed.    2. Cardiomyopathy: Presumed to be ischemic in the setting of history of CAD and abnormal stress test. He is euvolemic on exam. Volume management per dialysis/nephrology. He remains on beta blocker. He is not a candidate for ACE inhibitor, ARB, hydralazine, nitrates, spriro, or ARNI, in the setting of renal disease and history of hypotension.  3. Hypertensive heart disease: Blood pressure stable today. As noted above, he previously had hypotension which required his carvedilol dose be reduced.  4.  Paroxysmal atrial flutter: He is in sinus today. He is anticoagulated with warfarin.  5. End-stage renal disease: He is currently receiving Tuesday Thursday Saturday dialysis through a dietetic catheter. Plan is for AV fistula placement once he is cleared from a cardiac standpoint. Catheterization pending to provide additional risk assessment.   6. Disposition: Follow-up in approximately 3-4 weeks after his catheterization.  Nicolasa Duckinghristopher Magdeline Prange, NP 09/05/2015, 1:20 PM

## 2015-09-24 ENCOUNTER — Telehealth: Payer: Self-pay | Admitting: Cardiology

## 2015-09-24 ENCOUNTER — Other Ambulatory Visit: Payer: Self-pay

## 2015-09-24 ENCOUNTER — Telehealth: Payer: Self-pay | Admitting: *Deleted

## 2015-09-24 ENCOUNTER — Ambulatory Visit: Payer: Medicare Other | Admitting: Cardiology

## 2015-09-24 NOTE — Telephone Encounter (Signed)
Will forward for dr crenshaw review  

## 2015-09-24 NOTE — Telephone Encounter (Signed)
She forgot to ask you can he stop Coumadin 5 days prior to surgery?His last dose will be 10-02-15.

## 2015-09-24 NOTE — Telephone Encounter (Signed)
Will forward this ti stephanie

## 2015-09-24 NOTE — Telephone Encounter (Signed)
Order for "above knee prosthetic socket replacement" faxed to [employer] fax # [phone].  Pt's current socket no longer fits due to anatomical changes caused by weight loss.[name] [name].[name] [name], [name] Cassady7/3/201711:53 AM

## 2015-09-24 NOTE — Telephone Encounter (Signed)
Will fax this note to the number provided. 

## 2015-09-24 NOTE — Telephone Encounter (Signed)
Request for surgical clearance:  1. What type of surgery is being performed? Left Arm Av Fistula 2. When is this surgery scheduled? Not scheduled yet   3. Are there any medications that need to be held prior to surgery and how long?General clearance,pt had Cath on 630-17   4. Name of physician performing surgery? Dr Leonides SakeBrian Chen  5. What is your office phone and fax number? [phone] and fax number is [phone]-Att:Stephanie

## 2015-09-24 NOTE — Telephone Encounter (Signed)
[name]

## 2015-09-24 NOTE — Telephone Encounter (Signed)
Ok for surgery [name] [name]  

## 2015-10-01 ENCOUNTER — Other Ambulatory Visit: Payer: Self-pay | Admitting: *Deleted

## 2015-10-01 ENCOUNTER — Ambulatory Visit (INDEPENDENT_AMBULATORY_CARE_PROVIDER_SITE_OTHER): Payer: Medicare Other | Admitting: Pharmacist

## 2015-10-01 DIAGNOSIS — Z7901 Long term (current) use of anticoagulants: Secondary | ICD-10-CM

## 2015-10-01 DIAGNOSIS — I4892 Unspecified atrial flutter: Secondary | ICD-10-CM

## 2015-10-01 LAB — POCT INR: INR: 2

## 2015-10-01 MED ORDER — AMIODARONE HCL 200 MG PO TABS: 200.0000 mg | ORAL_TABLET | Freq: Every day | ORAL | Status: DC

## 2015-10-01 NOTE — Progress Notes (Signed)
Anti-Coagulation Progress Note  [name] [name] is a 70 y.o. male who is currently on an anti-coagulation regimen.    RECENT RESULTS: Recent results are below, the most recent result is correlated with a dose of 20 mg. per week--after having been HELD for procedure, he was resumed upon 20mg /wk: Lab Results  Component Value Date   INR 2.00 10/01/2015   INR 1.30 09/21/2015   INR 1.80 09/17/2015    ANTI-COAG DOSE: Anticoagulation Dose Instructions as of 10/01/2015      Glynis SmilesSun Mon Tue Wed Thu Fri Sat   New Dose 0 mg 0 mg 0 mg 0 mg 0 mg 0 mg 2.5 mg    Description        Will take doses for Monday July 10, Tuesday July 11 and Wednesday July 12. This is LAST dose for 5 days (no warfarin on Thursday July 13, Friday July 14, Saturday July 15, Sunday July 16. PROCEDURE is Monday 17-JUL-17. AFTER procedure, call me to discuss next appointment and subsequent dosing after procedure until such time you see me in clinic.        ANTICOAG SUMMARY: Anticoagulation Episode Summary    Current INR goal 2.0-3.0   Next INR check 10/08/2015   INR from last check 2.00 (10/01/2015)   Weekly max dose    Target end date    INR check location    Preferred lab    Send INR reminders to       Comments         ANTICOAG TODAY: Anticoagulation Summary as of 10/01/2015    INR goal 2.0-3.0   Selected INR 2.00 (10/01/2015)   Next INR check 10/08/2015   Target end date     Anticoagulation Episode Summary    INR check location    Preferred lab    Send INR reminders to    Comments       PATIENT INSTRUCTIONS: Patient Instructions  Patient instructed to take medications as defined in the Anti-coagulation Track section of this encounter.  Patient instructed to take today's dose.  Patient instructed to take doses for Monday July 10, Tuesday July 11 and Wednesday July 12. This is LAST dose for 5 days (no warfarin on Thursday July 13, Friday July 14, Saturday July 15, Sunday July 16. PROCEDURE is Monday  17-JUL-17. AFTER procedure, call me to discuss next appointment and subsequent dosing after procedure until such time you see me in clinic.   Patient verbalized understanding of these instructions.       FOLLOW-UP Return in 7 days (on 10/08/2015) for Follow up INR at 0900h.  Hulen LusterJames Isaac Dubie, III Pharm.D., CACP

## 2015-10-01 NOTE — Telephone Encounter (Signed)
Refill request per pt's wife.[name] [name].[name]7/10/20174:39 PM

## 2015-10-01 NOTE — Patient Instructions (Addendum)
Patient instructed to take medications as defined in the Anti-coagulation Track section of this encounter.  Patient instructed to take today's dose.  Patient instructed to take doses for Monday July 10, Tuesday July 11 and Wednesday July 12. This is LAST dose for 5 days (no warfarin on Thursday July 13, Friday July 14, Saturday July 15, Sunday July 16. PROCEDURE is Monday 17-JUL-17. AFTER procedure, call me to discuss next appointment and subsequent dosing after procedure until such time you see me in clinic.   Patient verbalized understanding of these instructions.

## 2015-10-02 NOTE — Progress Notes (Signed)
I have reviewed Dr. Saralyn PilarGroce's note.  Patient is on anticoagulation for A flutter.  INR is 2.0.  His coumadin is held for a procedure.  Follow up after procedure.

## 2015-10-05 ENCOUNTER — Encounter (HOSPITAL_COMMUNITY): Payer: Self-pay | Admitting: *Deleted

## 2015-10-05 MED ORDER — VANCOMYCIN HCL IN DEXTROSE 1-5 GM/200ML-% IV SOLN
1000.0000 mg | INTRAVENOUS | Status: AC
  Administered 2015-10-08: 1000 mg via INTRAVENOUS
  Filled 2015-10-05 (×2): qty 200

## 2015-10-05 MED ORDER — SODIUM CHLORIDE 0.9 % IV SOLN: INTRAVENOUS | Status: DC

## 2015-10-05 MED FILL — *ZEPATIER 50-100 MG TABLET: 50-100 | 28 days supply | Qty: 28 | Fill #0

## 2015-10-05 NOTE — Progress Notes (Signed)
SDW-pre-op call completed by both pt and pt spouse, [name] [name], with pt consent. Pt denies SOB and chest pain but is under the care of Dr. Jens Somrenshaw, Cardiology. Pt stated that last dose of Coumadin was 10/02/15.  Pt made aware to stop taking fish oil, herbal medications and NSAID's. Pt spouse stated that pt diabetes is diet controlled. Spouse made aware of diabetes protocol to check BS DOS and interventions for BS<70 and >220. Spouse verbalized understanding of all pre-op instructions.

## 2015-10-08 ENCOUNTER — Encounter (HOSPITAL_COMMUNITY)
Admission: RE | Disposition: A | Discharge status: Home / Self Care | Payer: Self-pay | Source: Ambulatory Visit | Attending: Vascular Surgery

## 2015-10-08 ENCOUNTER — Encounter (HOSPITAL_COMMUNITY): Payer: Self-pay | Admitting: Urology

## 2015-10-08 ENCOUNTER — Ambulatory Visit (HOSPITAL_COMMUNITY): Payer: Medicare Other | Admitting: Emergency Medicine

## 2015-10-08 ENCOUNTER — Ambulatory Visit: Payer: Medicare Other

## 2015-10-08 ENCOUNTER — Ambulatory Visit (HOSPITAL_COMMUNITY)
Admission: RE | Admit: 2015-10-08 | Discharge: 2015-10-08 | Disposition: A | Discharge status: Home / Self Care | Payer: Medicare Other | Source: Ambulatory Visit | Attending: Vascular Surgery | Admitting: Vascular Surgery

## 2015-10-08 DIAGNOSIS — E1140 Type 2 diabetes mellitus with diabetic neuropathy, unspecified: Secondary | ICD-10-CM | POA: Insufficient documentation

## 2015-10-08 DIAGNOSIS — K219 Gastro-esophageal reflux disease without esophagitis: Secondary | ICD-10-CM | POA: Diagnosis not present

## 2015-10-08 DIAGNOSIS — N186 End stage renal disease: Secondary | ICD-10-CM | POA: Insufficient documentation

## 2015-10-08 DIAGNOSIS — E1151 Type 2 diabetes mellitus with diabetic peripheral angiopathy without gangrene: Secondary | ICD-10-CM | POA: Insufficient documentation

## 2015-10-08 DIAGNOSIS — E039 Hypothyroidism, unspecified: Secondary | ICD-10-CM | POA: Insufficient documentation

## 2015-10-08 DIAGNOSIS — Z992 Dependence on renal dialysis: Secondary | ICD-10-CM | POA: Diagnosis not present

## 2015-10-08 DIAGNOSIS — Z7901 Long term (current) use of anticoagulants: Secondary | ICD-10-CM | POA: Insufficient documentation

## 2015-10-08 DIAGNOSIS — Z880 Allergy status to penicillin: Secondary | ICD-10-CM | POA: Insufficient documentation

## 2015-10-08 DIAGNOSIS — I120 Hypertensive chronic kidney disease with stage 5 chronic kidney disease or end stage renal disease: Secondary | ICD-10-CM | POA: Diagnosis present

## 2015-10-08 DIAGNOSIS — Z96642 Presence of left artificial hip joint: Secondary | ICD-10-CM | POA: Diagnosis not present

## 2015-10-08 DIAGNOSIS — Z955 Presence of coronary angioplasty implant and graft: Secondary | ICD-10-CM | POA: Diagnosis not present

## 2015-10-08 DIAGNOSIS — E785 Hyperlipidemia, unspecified: Secondary | ICD-10-CM | POA: Insufficient documentation

## 2015-10-08 DIAGNOSIS — B1920 Unspecified viral hepatitis C without hepatic coma: Secondary | ICD-10-CM | POA: Insufficient documentation

## 2015-10-08 DIAGNOSIS — I2510 Atherosclerotic heart disease of native coronary artery without angina pectoris: Secondary | ICD-10-CM | POA: Insufficient documentation

## 2015-10-08 DIAGNOSIS — N185 Chronic kidney disease, stage 5: Secondary | ICD-10-CM | POA: Diagnosis not present

## 2015-10-08 DIAGNOSIS — Z89611 Acquired absence of right leg above knee: Secondary | ICD-10-CM | POA: Insufficient documentation

## 2015-10-08 DIAGNOSIS — E1122 Type 2 diabetes mellitus with diabetic chronic kidney disease: Secondary | ICD-10-CM | POA: Insufficient documentation

## 2015-10-08 DIAGNOSIS — Z87891 Personal history of nicotine dependence: Secondary | ICD-10-CM | POA: Insufficient documentation

## 2015-10-08 HISTORY — PX: AV FISTULA PLACEMENT: SHX1204

## 2015-10-08 HISTORY — DX: Hypothyroidism, unspecified: E03.9

## 2015-10-08 HISTORY — DX: Inflammatory liver disease, unspecified: K75.9

## 2015-10-08 LAB — GLUCOSE, CAPILLARY
Glucose-Capillary: 135 mg/dL — ABNORMAL HIGH (ref 65–99)
Glucose-Capillary: 156 mg/dL — ABNORMAL HIGH (ref 65–99)

## 2015-10-08 LAB — PROTIME-INR
INR: 1.41 (ref 0.00–1.49)
Prothrombin Time: 17.3 seconds — ABNORMAL HIGH (ref 11.6–15.2)

## 2015-10-08 LAB — POCT I-STAT 4, (NA,K, GLUC, HGB,HCT)
GLUCOSE: 120 mg/dL — AB (ref 65–99)
HEMATOCRIT: 34 % — AB (ref 39.0–52.0)
Hemoglobin: 11.6 g/dL — ABNORMAL LOW (ref 13.0–17.0)
Potassium: 3.9 mmol/L (ref 3.5–5.1)
Sodium: 135 mmol/L (ref 135–145)

## 2015-10-08 LAB — APTT: aPTT: >200 seconds (ref 24–37)

## 2015-10-08 SURGERY — ARTERIOVENOUS (AV) FISTULA CREATION
Anesthesia: Monitor Anesthesia Care | Site: Arm Upper | Laterality: Left

## 2015-10-08 MED ORDER — LIDOCAINE HCL (PF) 1 % IJ SOLN
INTRAMUSCULAR | Status: DC | PRN
Start: 2015-10-08 — End: 2015-10-08
  Administered 2015-10-08: 30 mL

## 2015-10-08 MED ORDER — PROPOFOL 500 MG/50ML IV EMUL
INTRAVENOUS | Status: DC | PRN
  Administered 2015-10-08: 75 ug/kg/min via INTRAVENOUS

## 2015-10-08 MED ORDER — PROPOFOL 10 MG/ML IV BOLUS
INTRAVENOUS | Status: AC
  Filled 2015-10-08: qty 20

## 2015-10-08 MED ORDER — ONDANSETRON HCL 4 MG/2ML IJ SOLN
INTRAMUSCULAR | Status: AC
  Filled 2015-10-08: qty 2

## 2015-10-08 MED ORDER — ROCURONIUM BROMIDE 50 MG/5ML IV SOLN
INTRAVENOUS | Status: AC
  Filled 2015-10-08: qty 1

## 2015-10-08 MED ORDER — MIDAZOLAM HCL 2 MG/2ML IJ SOLN
INTRAMUSCULAR | Status: AC
  Filled 2015-10-08: qty 2

## 2015-10-08 MED ORDER — FENTANYL CITRATE (PF) 100 MCG/2ML IJ SOLN: 25.0000 ug | INTRAMUSCULAR | Status: DC | PRN

## 2015-10-08 MED ORDER — OXYCODONE-ACETAMINOPHEN 5-325 MG PO TABS
1.0000 | ORAL_TABLET | Freq: Four times a day (QID) | ORAL | Status: DC | PRN
Start: 2015-10-08 — End: 2015-11-02

## 2015-10-08 MED ORDER — LIDOCAINE HCL (PF) 1 % IJ SOLN
INTRAMUSCULAR | Status: AC
  Filled 2015-10-08: qty 30

## 2015-10-08 MED ORDER — SODIUM CHLORIDE 0.9 % IV SOLN
INTRAVENOUS | Status: DC | PRN
  Administered 2015-10-08: 07:00:00 via INTRAVENOUS

## 2015-10-08 MED ORDER — EPHEDRINE 5 MG/ML INJ
INTRAVENOUS | Status: AC
  Filled 2015-10-08: qty 10

## 2015-10-08 MED ORDER — PRAVASTATIN SODIUM 40 MG PO TABS: 40.0000 mg | ORAL_TABLET | ORAL | Status: DC

## 2015-10-08 MED ORDER — SODIUM CHLORIDE 0.9 % IV SOLN
INTRAVENOUS | Status: DC | PRN
  Administered 2015-10-08: 500 mL

## 2015-10-08 MED ORDER — WARFARIN SODIUM 2.5 MG PO TABS: 2.5000 mg | ORAL_TABLET | Freq: Every day | ORAL | Status: DC

## 2015-10-08 MED ORDER — FENTANYL CITRATE (PF) 250 MCG/5ML IJ SOLN
INTRAMUSCULAR | Status: AC
  Filled 2015-10-08: qty 5

## 2015-10-08 MED ORDER — PROMETHAZINE HCL 25 MG/ML IJ SOLN: 6.2500 mg | INTRAMUSCULAR | Status: DC | PRN

## 2015-10-08 MED ORDER — SUCCINYLCHOLINE CHLORIDE 200 MG/10ML IV SOSY
PREFILLED_SYRINGE | INTRAVENOUS | Status: AC
  Filled 2015-10-08: qty 10

## 2015-10-08 MED ORDER — CHLORHEXIDINE GLUCONATE 4 % EX LIQD: 1.0000 "application " | Freq: Once | CUTANEOUS | Status: DC

## 2015-10-08 MED ORDER — 0.9 % SODIUM CHLORIDE (POUR BTL) OPTIME
TOPICAL | Status: DC | PRN
Start: 2015-10-08 — End: 2015-10-08
  Administered 2015-10-08: 1000 mL

## 2015-10-08 SURGICAL SUPPLY — 33 items
AGENT HMST SPONGE THK3/8 (HEMOSTASIS)
ARMBAND PINK RESTRICT EXTREMIT (MISCELLANEOUS) ×3 IMPLANT
CANISTER SUCTION 2500CC (MISCELLANEOUS) ×3 IMPLANT
CLIP TI MEDIUM 6 (CLIP) ×3 IMPLANT
CLIP TI WIDE RED SMALL 6 (CLIP) ×3 IMPLANT
COVER PROBE W GEL 5X96 (DRAPES) ×3 IMPLANT
DECANTER SPIKE VIAL GLASS SM (MISCELLANEOUS) ×3 IMPLANT
ELECT REM PT RETURN 9FT ADLT (ELECTROSURGICAL) ×3
ELECTRODE REM PT RTRN 9FT ADLT (ELECTROSURGICAL) ×1 IMPLANT
GLOVE BIO SURGEON STRL SZ 6.5 (GLOVE) ×1 IMPLANT
GLOVE BIO SURGEON STRL SZ7 (GLOVE) ×3 IMPLANT
GLOVE BIO SURGEONS STRL SZ 6.5 (GLOVE) ×1
GLOVE BIOGEL PI IND STRL 6.5 (GLOVE) IMPLANT
GLOVE BIOGEL PI IND STRL 7.5 (GLOVE) ×1 IMPLANT
GLOVE BIOGEL PI INDICATOR 6.5 (GLOVE) ×8
GLOVE BIOGEL PI INDICATOR 7.5 (GLOVE) ×2
GLOVE ECLIPSE 6.5 STRL STRAW (GLOVE) ×2 IMPLANT
GOWN STRL REUS W/ TWL LRG LVL3 (GOWN DISPOSABLE) ×3 IMPLANT
GOWN STRL REUS W/TWL LRG LVL3 (GOWN DISPOSABLE) ×9
HEMOSTAT SPONGE AVITENE ULTRA (HEMOSTASIS) IMPLANT
KIT BASIN OR (CUSTOM PROCEDURE TRAY) ×3 IMPLANT
KIT ROOM TURNOVER OR (KITS) ×3 IMPLANT
LIQUID BAND (GAUZE/BANDAGES/DRESSINGS) ×3 IMPLANT
NS IRRIG 1000ML POUR BTL (IV SOLUTION) ×3 IMPLANT
PACK CV ACCESS (CUSTOM PROCEDURE TRAY) ×3 IMPLANT
PAD ARMBOARD 7.5X6 YLW CONV (MISCELLANEOUS) ×6 IMPLANT
SUT MNCRL AB 4-0 PS2 18 (SUTURE) ×3 IMPLANT
SUT PROLENE 6 0 BV (SUTURE) ×2 IMPLANT
SUT PROLENE 7 0 BV 1 (SUTURE) ×3 IMPLANT
SUT VIC AB 3-0 SH 27 (SUTURE) ×3
SUT VIC AB 3-0 SH 27X BRD (SUTURE) ×1 IMPLANT
UNDERPAD 30X30 INCONTINENT (UNDERPADS AND DIAPERS) ×3 IMPLANT
WATER STERILE IRR 1000ML POUR (IV SOLUTION) ×3 IMPLANT

## 2015-10-08 NOTE — Anesthesia Preprocedure Evaluation (Addendum)
Anesthesia Evaluation  Patient identified by MRN, date of birth, ID band Patient awake    Reviewed: Allergy & Precautions, NPO status , Patient's Chart, lab work & pertinent test results  Airway Mallampati: II  TM Distance: >3 FB Neck ROM: Full    Dental no notable dental hx. (+) Teeth Intact, Poor Dentition, Missing, Chipped, Dental Advisory Given   Pulmonary neg pulmonary ROS, former smoker,    Pulmonary exam normal breath sounds clear to auscultation       Cardiovascular hypertension, Pt. on medications and Pt. on home beta blockers + CAD, + Cardiac Stents and + Peripheral Vascular Disease  Normal cardiovascular exam+ dysrhythmias Atrial Fibrillation  Rhythm:Regular Rate:Normal     Neuro/Psych negative neurological ROS  negative psych ROS   GI/Hepatic negative GI ROS, Neg liver ROS, GERD  Medicated and Controlled,(+) Hepatitis -, C  Endo/Other  diabetes, Type 2Hypothyroidism   Renal/GU Dialysis and ESRFRenal disease  negative genitourinary   Musculoskeletal negative musculoskeletal ROS (+) Arthritis , Osteoarthritis,    Abdominal   Peds negative pediatric ROS (+)  Hematology negative hematology ROS (+) anemia ,   Anesthesia Other Findings   Reproductive/Obstetrics negative OB ROS                           Anesthesia Physical Anesthesia Plan  ASA: III  Anesthesia Plan: MAC   Post-op Pain Management:    Induction: Intravenous  Airway Management Planned: Simple Face Mask  Additional Equipment:   Intra-op Plan:   Post-operative Plan: Extubation in OR  Informed Consent: I have reviewed the patients History and Physical, chart, labs and discussed the procedure including the risks, benefits and alternatives for the proposed anesthesia with the patient or authorized representative who has indicated his/her understanding and acceptance.   Dental advisory given  Plan Discussed with:  CRNA, Surgeon and Anesthesiologist  Anesthesia Plan Comments:        Anesthesia Quick Evaluation

## 2015-10-08 NOTE — Transfer of Care (Signed)
Immediate Anesthesia Transfer of Care Note  Patient: [name] [name]  Procedure(s) Performed: Procedure(s):  BRACHIOCEPHALIC ARTERIOVENOUS (AV) FISTULA CREATION Right Arm (Left)  Patient Location: PACU  Anesthesia Type:MAC  Level of Consciousness: awake and patient cooperative  Airway & Oxygen Therapy: Patient Spontanous Breathing and Patient connected to nasal cannula oxygen  Post-op Assessment: Report given to RN, Post -op Vital signs reviewed and stable and Patient moving all extremities  Post vital signs: Reviewed and stable  Last Vitals:  Filed Vitals:   10/08/15 0712  BP: 185/91  Pulse: 54  Temp: 36.7 C  Resp: 18    Last Pain: There were no vitals filed for this visit.       Complications: No apparent anesthesia complications

## 2015-10-08 NOTE — Anesthesia Procedure Notes (Signed)
Procedure Name: MAC Date/Time: 10/08/2015 7:39 AM Performed by: Rogelia BogaMUELLER, Disney Ruggiero P Pre-anesthesia Checklist: Patient identified, Emergency Drugs available, Suction available, Patient being monitored and Timeout performed Patient Re-evaluated:Patient Re-evaluated prior to inductionOxygen Delivery Method: Simple face mask

## 2015-10-08 NOTE — H&P (Signed)
Brief History and Physical  History of Present Illness  [name] [name] is a 70 y.o. male who presents with chief complaint: end stage renal disease.  The patient presents today for R arm AVF placement.    Past Medical History  Diagnosis Date  . Hyperlipidemia 04/02/2006  . Hypertensive heart disease 12/09/2006  . Benign prostatic hypertrophy with nocturia 07/07/2006  . Chronic venous insufficiency 04/12/2010  . Obesity (BMI 30.0-34.9) 01/15/2012  . Constipation 05/25/2009    Intermittent   . Microcytic normochromic anemia 05/27/2006  . Internal and external hemorrhoids without complication 08/20/2012  . Coronary artery disease 04/02/2006    a. s/p RCA DES (2.5 x 24 mm TAXUS drug-eluting stent) 2006;  b. low risk myoview in 2011; c. 5/17 MV: EF 28%, large inf apical and inflat infarct w/o ischemia.  . 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 Type 2 diabetes mellitus with peripheral artery disease (HCC) 05/27/2006    Absent pulses in the left foot   . Closed displaced fracture of left femoral neck (HCC) 06/14/2014    s/p left hip hemiarthroplasty June 14, 2014   . Atrial flutter (HCC) 07/26/2014    a. May 2016, CHA2DS2VASc = 4 -> Eliquis, spontaneous conversion to NSR;  b. On amio;  c. 01/2015 EPS: Unable to induce right sided Aflutter. Non-sustained Afib and Left sided Aflutter noted.  [name] GERD (gastroesophageal reflux disease)   . 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 History of blood transfusion 05/2014    "related to hip OR"  . Degenerative joint disease involving multiple joints 02/02/2007  . Arthritis     "fingers" (02/07/2015)  . End stage renal disease on dialysis [facility])     a. TTS Dialysis - currently through  right chest diatek, pending AVF.  [name] Cardiomyopathy (HCC)     a. 07/2014 Echo: EF 50%; b. 03/2015 Ech: EF 20-25%; c. 07/2015 Echo: EF 30-35%, Gr1 DD, sev LVH.  [name] Hypertension   . Hypothyroidism   . Hepatitis     Hep C    Past Surgical History  Procedure Laterality Date  . Pilonidal cyst excision  1990's  . Refractive surgery Bilateral   . Fracture surgery    . Total hip arthroplasty Left 06/14/2014    Procedure: HEMI HIP ARTHROPLASTY ANTERIOR APPROACH;  Surgeon: Tarry Kos, MD;  Location: [facility] OR;  Service: Orthopedics;  Laterality: Left;  . Joint replacement    . Leg amputation above knee Right ~ 2008  . Cholecystectomy N/A 12/12/2014    Procedure: LAPAROSCOPIC CHOLECYSTECTOMY WITH INTRAOPERATIVE CHOLANGIOGRAM;  Surgeon: Gaynelle Adu, MD;  Location: [facility] OR;  Service: General;  Laterality: N/A;  . Electrophysiologic study N/A 02/07/2015    Procedure: A-Flutter Ablation;  Surgeon: Marinus Maw, MD;  Location: [facility] INVASIVE CV LAB;  Service: Cardiovascular;  Laterality: N/A;  . Tonsillectomy    . Coronary angioplasty with stent placement    . Prostate biopsy  ~ 2013  . Insertion of dialysis catheter Right 06/13/2015    Procedure: INSERTION OF DIALYSIS CATHETER RIGHT INTERNAL JUGULAR;  Surgeon: Larina Earthly, MD;  Location: [facility] OR;  Service: Vascular;  Laterality: Right;  . Removal of a dialysis catheter Right 06/13/2015  Procedure: REMOVAL OF A DIALYSIS CATHETER;  Surgeon: Larina Earthlyodd F Early, MD;  [facility];  Service: Vascular;  Laterality: Right;  . Cardiac catheterization N/A 09/21/2015    Procedure: Left Heart Cath and Coronary Angiography;  Surgeon: Marykay Lexavid W Harding, MD;  [facility];  Service: Cardiovascular;  Laterality: N/A;    Social History   Social History  . Marital Status: Married    Spouse Name: N/A  . Number of Children: N/A  . Years of Education: N/A   Occupational History  . Not on file.   Social History Main Topics  . Smoking status: Former Smoker --  1.00 packs/day for 10 years    Types: Cigarettes    Quit date: 06/16/1968  . Smokeless tobacco: Never Used  . Alcohol Use: No     Comment: "quit alcohol in the 1960's"  . Drug Use: No  . Sexual Activity: Not Currently    Birth Control/ Protection: None   Other Topics Concern  . Not on file   Social History Narrative    Family History  Problem Relation Age of Onset  . Hypertension Mother   . Heart attack Father   . Breast cancer Sister   . Arthritis Sister   . Heart attack Brother   . Diabetes Brother   . Stroke Brother   . Pneumonia Daughter   . Diabetes Brother   . Alcoholism Brother   . Diabetes Brother   . Arthritis Sister     Bilateral knee replacement  . HIV Daughter   . Hypertension Daughter   . Drug abuse Daughter   . Schizophrenia Daughter   . Hypertension Daughter   . Drug abuse Daughter   . Bipolar disorder Daughter     No current facility-administered medications on file prior to encounter.   Current Outpatient Prescriptions on File Prior to Encounter  Medication Sig Dispense Refill  . calcium citrate-vitamin D (CITRACAL+D) 315-200 MG-UNIT per tablet Take 1 tablet by mouth daily.     . carvedilol (COREG) 6.25 MG tablet Take 1 tablet (6.25 mg total) by mouth 2 (two) times daily. 180 tablet 3  . docusate sodium (COLACE) 100 MG capsule Take 100 mg by mouth at bedtime as needed for moderate constipation (constipation). Reported on 08/03/2015    . famotidine (PEPCID) 20 MG tablet Take 20 mg before breakfast and 20 mg before supper 60 tablet 6  . fish oil-omega-3 fatty acids 1000 MG capsule Take 1 g by mouth daily.    . fluticasone (FLONASE) 50 MCG/ACT nasal spray Place 1 spray into both nostrils daily as needed for allergies. 16 g 3  . hydroxypropyl methylcellulose (ISOPTO TEARS) 2.5 % ophthalmic solution Place 2 drops into both eyes daily as needed for dry eyes. Reported on 04/20/2015    . levothyroxine (SYNTHROID, LEVOTHROID) 25 MCG tablet Take 1 tablet (25  mcg total) by mouth daily before breakfast. 90 tablet 3  . Multiple Vitamins-Minerals (MULTIVITAMIN WITH MINERALS) tablet Take 1 tablet by mouth daily.    . ondansetron (ZOFRAN) 4 MG tablet Take 1 tablet (4 mg total) by mouth every 8 (eight) hours as needed for nausea or vomiting. 30 tablet 1  . pravastatin (PRAVACHOL) 40 MG tablet Take 1 tablet (40 mg total) by mouth every evening. (Patient taking differently: Take 40 mg by mouth every morning. ) 30 tablet 6  . warfarin (COUMADIN) 2.5 MG tablet Take 1&1/2 tablets on Mondays; all other days, take 1 tablet. (Patient taking differently: Take 2.5-3.75  mg by mouth daily at 6 PM. 3.75 mg on Monday.  2.5 mg all other days) 32 tablet 2  . Elbasvir-Grazoprevir (ZEPATIER) 50-100 MG TABS Take 1 tablet by mouth daily. 28 tablet 2    Allergies  Allergen Reactions  . Lisinopril Other (See Comments)    Acute kidney injury  . Amoxicillin Swelling and Other (See Comments)    Puffy eyes and abdominal pain with Amoxicillin PO  . Pravastatin Other (See Comments)    Weakness and fatigue  . Tamsulosin Other (See Comments)    dry throat, sweating, blurred vision  . Doxycycline Rash and Other (See Comments)    Questionable drug rxn rash    Review of Systems: As listed above, otherwise negative.  Physical Examination  Filed Vitals:   10/08/15 0712  BP: 185/91  Pulse: 54  Temp: 98 F (36.7 C)  TempSrc: Oral  Resp: 18  Weight: 165 lb (74.844 kg)  SpO2: 100%    General: A&O x 3, WDWN   Pulmonary: Sym exp, good air movt, CTAB, no rales, rhonchi, & wheezing, RIJV TDC  Cardiac: RRR, Nl S1, S2, no Murmurs, rubs or gallops  Gastrointestinal: soft, NTND, -G/R, - HSM, - masses, - CVAT B  Musculoskeletal: M/S 5/5 throughout , Extremities without ischemic changes   Laboratory See iStat  Medical Decision Making  [name] [name] is a 70 y.o. male who presents with: ESRD.   The patient is scheduled for: R arm AVF placement  Risk, benefits, and  alternatives to access surgery were discussed.  The patient is aware the risks include but are not limited to: bleeding, infection, steal syndrome, nerve damage, ischemic monomelic neuropathy, failure to mature, and need for additional procedures.  The patient is aware of the risks and agrees to proceed.  Leonides Sake, MD [facility] Office: [phone] Pager: [phone]  10/08/2015, 7:20 AM

## 2015-10-08 NOTE — Op Note (Signed)
OPERATIVE NOTE   PROCEDURE: right brachiocephalic arteriovenous fistula placement  PRE-OPERATIVE DIAGNOSIS: end stage renal disease   POST-OPERATIVE DIAGNOSIS: same as above   SURGEON: Leonides Sake, MD  ASSISTANT(S): Karsten Ro, PAC   ANESTHESIA: local and MAC  ESTIMATED BLOOD LOSS: 50 cc  FINDING(S): 1.  Calcified and diseased brachial artery 2.  Palpable thrill with faintly palpable radial pulse at end of case  SPECIMEN(S):  none  INDICATIONS:   [name] [name] is a 70 y.o. male who presents with end stage renal disease.  The patient is scheduled for right radiocephalic vs brachiocephalic arteriovenous fistula placement.  The patient is aware the risks include but are not limited to: bleeding, infection, steal syndrome, nerve damage, ischemic monomelic neuropathy, failure to mature, and need for additional procedures.  The patient is aware of the risks of the procedure and elects to proceed forward.  DESCRIPTION: After full informed written consent was obtained from the patient, the patient was brought back to the operating room and placed supine upon the operating table.  Prior to induction, the patient received IV antibiotics.   After obtaining adequate anesthesia, the patient was then prepped and draped in the standard fashion for a right arm access procedure.  I turned my attention first to identifying the patient's cephalic vein and brachial artery.  Using SonoSite guidance, the location of these vessels were marked out on the skin.   At this point, I injected local anesthetic to obtain a field block of the antecubitum.  In total, I injected about 5 mL of 1% lidocaine without epinephrine.  I made a transverse incision at the level of the antecubitum and dissected through the subcutaneous tissue and fascia to gain exposure of the brachial artery.  This was noted to be 4 mm in diameter externally.  This artery was calcified and obvious atherosclerosis evident through the artery  wall.  This was dissected out proximally and distally and controlled with vessel loops .  I then dissected out the cephalic vein.  This was noted to be 3-3.5 mm in diameter externally.  The distal segment of the vein was ligated with a  2-0 silk, and the vein was transected.  The proximal segment was interrogated with serial dilators.  The vein accepted up to a 4 mm dilator without any difficulty.  I then instilled the heparinized saline into the vein and clamped it.  At this point, I reset my exposure of the brachial artery and placed the artery under tension proximally and distally.  I made an arteriotomy with a #11 blade, and then I extended the arteriotomy with a Potts scissor.  I injected heparinized saline proximal and distal to this arteriotomy.  The vein was then sewn to the artery in an end-to-side configuration with a running stitch of 7-0 Prolene.  Prior to completing this anastomosis, I allowed the vein and artery to backbleed.  There was no evidence of clot from any vessels.  I completed the anastomosis in the usual fashion and then released all vessel loops and clamps.  There was a palpable thrill in the venous outflow, and there was a faintly palpable radial pulse.  I verified this with a continuous doppler.  There was minimal augmentation with compression.  At this point, I irrigated out the surgical wound.  There was no further active bleeding.  The subcutaneous tissue was reapproximated with a running stitch of 3-0 Vicryl.  The skin was then reapproximated with a running subcuticular stitch of 4-0  Vicryl.  The skin was then cleaned, dried, and reinforced with Dermabond.  The patient tolerated this procedure well. none  COMPLICATIONS: none  CONDITION: stable   Leonides SakeBrian Mirelle Biskup, MD [facility] [place] Office: [phone] Pager: [phone]  10/08/2015, 8:45 AM

## 2015-10-08 NOTE — Anesthesia Postprocedure Evaluation (Signed)
Anesthesia Post Note  Patient: [name] [name]  Procedure([name]) Performed: Procedure([name]) (LRB):  BRACHIOCEPHALIC ARTERIOVENOUS (AV) FISTULA CREATION Right Arm (Left)  Patient location during evaluation: PACU Anesthesia Type: MAC Level of consciousness: awake and alert Pain management: pain level controlled Vital Signs Assessment: post-procedure vital signs reviewed and stable Respiratory status: spontaneous breathing, nonlabored ventilation, respiratory function stable and patient connected to nasal cannula oxygen Cardiovascular status: blood pressure returned to baseline and stable Postop Assessment: no signs of nausea or vomiting Anesthetic complications: no    Last Vitals:  Filed Vitals:   10/08/15 0940 10/08/15 1000  BP:  147/95  Pulse: 56   Temp:    Resp: 12 16    Last Pain:  Filed Vitals:   10/08/15 1005  PainSc: 0-No pain                 [name] [name]

## 2015-10-09 ENCOUNTER — Telehealth: Payer: Self-pay | Admitting: Vascular Surgery

## 2015-10-09 ENCOUNTER — Encounter (HOSPITAL_COMMUNITY): Payer: Self-pay | Admitting: Vascular Surgery

## 2015-10-09 NOTE — Telephone Encounter (Signed)
Sched appt 9/1 at 9:45. Spoke to pt's wife to inform them of appt.

## 2015-10-09 NOTE — Telephone Encounter (Signed)
-----   Message from Sharee PimpleMarilyn K McChesney, RN sent at 10/08/2015  9:16 AM EDT ----- Regarding: schedule 6 weeks No duplex   ----- Message -----    From: Raymond GurneyKimberly A Trinh, PA-C    Sent: 10/08/2015   8:57 AM      To: [facility] Charge Pool  S/p right brachial-cephalic AVF 10/08/15  F/u with Dr. Imogene Burnhen in 6 weeks. No duplex.  Thanks Selena BattenKim

## 2015-10-10 ENCOUNTER — Encounter: Payer: Self-pay | Admitting: Pharmacy Technician

## 2015-10-15 ENCOUNTER — Encounter: Payer: Self-pay | Admitting: Cardiology

## 2015-10-22 ENCOUNTER — Ambulatory Visit (INDEPENDENT_AMBULATORY_CARE_PROVIDER_SITE_OTHER): Payer: Medicare Other | Admitting: Pharmacist

## 2015-10-22 ENCOUNTER — Encounter (INDEPENDENT_AMBULATORY_CARE_PROVIDER_SITE_OTHER): Payer: Self-pay

## 2015-10-22 DIAGNOSIS — Z7901 Long term (current) use of anticoagulants: Secondary | ICD-10-CM | POA: Diagnosis not present

## 2015-10-22 DIAGNOSIS — I4892 Unspecified atrial flutter: Secondary | ICD-10-CM

## 2015-10-22 LAB — POCT INR: INR: 1.7

## 2015-10-22 NOTE — Patient Instructions (Signed)
Patient educated about medication as defined in this encounter and verbalized understanding by repeating back instructions provided.   

## 2015-10-22 NOTE — Progress Notes (Signed)
Anticoagulation Management [name] [name] is a 70 y.o. male who reports to the clinic for monitoring of warfarin treatment.    Indication: paroxysmal atrial flutter(CHA2DS2VASC score of 4) Duration: indefinite  Anticoagulation Clinic Visit History: Patient does not report signs/symptoms of bleeding or thromboembolism or any other changes  Anticoagulation Episode Summary    Current INR goal:   2.0-3.0  TTR:   64.2 % (3.7 mo)  Next INR check:   11/05/2015  INR from last check:   1.7! (10/22/2015)  Weekly max dose:     Target end date:     INR check location:     Preferred lab:     Send INR reminders to:        Comments:          ASSESSMENT Recent Results: The most recent result is correlated with 18.25 mg per week: Lab Results  Component Value Date   INR 1.7 10/22/2015   INR 1.41 10/08/2015   INR 2.00 10/01/2015    Anticoagulation Dosing: INR as of 10/22/2015 and Previous Dosing Information    INR Dt INR Goal Jacki Cones Sun Mon Tue Wed Thu Fri Sat   10/22/2015 1.7 2.0-3.0 7.5 mg 1.5 mg 1 mg 1 mg 1 mg 1 mg 1 mg 1 mg   Patient deviated from recommended dosing.       Previous description   Will take doses for Monday July 10, Tuesday July 11 and Wednesday July 12. This is LAST dose for 5 days (no warfarin on Thursday July 13, Friday July 14, Saturday July 15, Sunday July 16. PROCEDURE is Monday 17-JUL-17. AFTER procedure, call me to discuss next appointment and subsequent dosing after procedure until such time you see me in clinic.    Anticoagulation Dose Instructions as of 10/22/2015      Total Sun Mon Tue Wed Thu Fri Sat   New Dose 20 mg 2.5 mg 3.75 mg 2.5 mg 3.75 mg 2.5 mg 2.5 mg 2.5 mg     (2.5 mg x 1)  (2.5 mg x 1.5)  (2.5 mg x 1)  (2.5 mg x 1.5)  (2.5 mg x 1)  (2.5 mg x 1)  (2.5 mg x 1)                           INR today: Subtherapeutic  PLAN Weekly dose was increased by 10% to 20 mg per week  Patient Instructions  Patient educated about medication as  defined in this encounter and verbalized understanding by repeating back instructions provided.    Patient advised to contact clinic or seek medical attention if signs/symptoms of bleeding or thromboembolism occur.  Patient verbalized understanding by repeating back information and was advised to contact me if further medication-related questions arise. Patient was also provided an information handout.  Follow-up Return in about 2 weeks (around 11/05/2015) for Follow up INR 11/05/2015 around 9:30am.  Kim,Jennifer J  15 minutes spent face-to-face with the patient during the encounter. 50% of time spent on education. 50% of time was spent on assessment and plan.

## 2015-10-24 ENCOUNTER — Ambulatory Visit (INDEPENDENT_AMBULATORY_CARE_PROVIDER_SITE_OTHER): Payer: Medicare Other | Admitting: Pharmacist Clinician (PhC)/ Clinical Pharmacy Specialist

## 2015-10-24 DIAGNOSIS — B182 Chronic viral hepatitis C: Secondary | ICD-10-CM | POA: Diagnosis not present

## 2015-10-24 NOTE — Progress Notes (Signed)
Patient ID: [name] [name], male   DOB: [birth year], 70 y.o.   MRN: [id number]  HPI: [name] [name] is a 70 y.o. male who presents for management of chronic hepatitis C, genotype 1a. Patient tested positive earlier this year. Hepatitis C-associated risk factors present are: IV drug use (many years ago). Pt started Zepatier on 7/18 (compatible with ESRD on HD).  Allergies: Allergies  Allergen Reactions  . Lisinopril Other (See Comments)    Acute kidney injury  . Amoxicillin Swelling and Other (See Comments)    Puffy eyes and abdominal pain with Amoxicillin PO  . Pravastatin Other (See Comments)    Weakness and fatigue  . Tamsulosin Other (See Comments)    dry throat, sweating, blurred vision  . Doxycycline Rash and Other (See Comments)    Questionable drug rxn rash    Past Medical History: Past Medical History:  Diagnosis Date  . Arthritis    "fingers" (02/07/2015)  . Atrial flutter (HCC) 07/26/2014   a. May 2016, CHA2DS2VASc = 4 -> Eliquis, spontaneous conversion to NSR;  b. On amio;  c. 01/2015 EPS: Unable to induce right sided Aflutter. Non-sustained Afib and Left sided Aflutter noted.  . Benign prostatic hypertrophy with nocturia 07/07/2006  . Cardiomyopathy (HCC)    a. 07/2014 Echo: EF 50%; b. 03/2015 Ech: EF 20-25%; c. 07/2015 Echo: EF 30-35%, Gr1 DD, sev LVH.  [name] Chronic venous insufficiency 04/12/2010  . Closed displaced fracture of left femoral neck (HCC) 06/14/2014   s/p left hip hemiarthroplasty June 14, 2014   . Constipation 05/25/2009   Intermittent   . Coronary artery disease 04/02/2006   a. s/p RCA DES (2.5 x 24 mm TAXUS drug-eluting stent) 2006;  b. low risk myoview in 2011; c. 5/17 MV: EF 28%, large inf apical and inflat infarct w/o ischemia.  . Degenerative joint disease involving multiple joints 02/02/2007  . End stage renal disease on dialysis [facility])    a. TTS Dialysis - currently through right chest diatek, pending AVF.  [name] GERD (gastroesophageal reflux disease)   .  Hepatitis    Hep C  . History of blood transfusion 05/2014   "related to hip OR"  . Hyperlipidemia 04/02/2006  . Hypertension   . Hypertensive heart disease 12/09/2006  . Hypothyroidism   . Internal and external hemorrhoids without complication 08/20/2012  . Microcytic normochromic anemia 05/27/2006  . Obesity (BMI 30.0-34.9) 01/15/2012  . 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 Type 2 diabetes mellitus with peripheral artery disease (HCC) 05/27/2006   Absent pulses in the left foot   . Type 2 diabetes mellitus with stage 5 chronic kidney disease (HCC) 08/06/2012    Social History: Social History   Social History  . Marital status: Married    Spouse name: N/A  . Number of children: N/A  . Years of education: N/A   Social History Main Topics  . Smoking status: Former Smoker    Packs/day: 1.00    Years: 10.00    Types: Cigarettes    Quit date: 06/16/1968  . Smokeless tobacco: Never Used  . Alcohol use No     Comment: "quit alcohol in the 1960's"  . Drug use: No  . Sexual activity: Not Currently    Birth control/ protection: None  Other Topics Concern  . Not on file   Social History Narrative  . No narrative on file    Labs: Hep B S Ab (no units)  Date Value  06/09/2015 Reactive   Hepatitis B Surface Ag (no units)  Date Value  06/09/2015 Negative    Hepatitis C RNA quantitative Latest Ref Rng & Units 12/11/2014  HCV Quantitative >50 IU/mL 4,720,000  HCV Quantitative Log >1.70 log10 IU/mL 6.674    AST (U/L)  Date Value  09/17/2015 39 (H)  08/10/2015 36 (H)  06/08/2015 41   ALT (U/L)  Date Value  09/17/2015 25  08/27/2015 24  08/10/2015 20  06/08/2015 36   INR (no units)  Date Value  10/22/2015 1.7  10/08/2015 1.41   10/01/2015 2.00  09/21/2015 1.30  09/17/2015 1.80  09/17/2015 1.7 (H)    CrCl: CrCl cannot be calculated (Unknown ideal weight.).  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 Denies any side effects. Only complaint is cost- $100 for 28 day supply. Reviewed pathogenesis of HCV and explained fibrosis score. Emphasized adherence for 3 months. Pt's wife takes Zepatier out of blister pack and puts in pill box. She read on the package insert that this is not recommended, but this is the best way for her to remember to give him his meds. I explained that this is ok, but to make sure it's away from any heat or moisture. Reviewed all medications; no interactions; no OTC antacid use.  Recommendations: -Return to clinic on 8/18 for labs -Ultra sound scheduled for 8/16 -F/u visit with Dr. Luciana Axe on 10/9   Mackie Pai, PharmD PGY1 Pharmacy Resident Pager: [phone] 10/24/2015 11:29 AM

## 2015-10-24 NOTE — Progress Notes (Signed)
HPI: FU coronary disease, status post PCI of his right coronary artery with drug- eluting stent in February 2006 and atrial flutter.An abdominal ultrasound in August 2006 showed no aneurysm. Carotid Dopplers in August of 2006 showed probable 0-39% stenosis. H/O atrial flutter. Echo 5/16 showed EF 50 with basal inferior akinesis, mild LAE, mild RAE/RVE. Placed on amiodarone and converted. Patient had attempted atrial flutter ablation in November 2016 but was unsuccessful. Echocardiogram May 2017 showed ejection fraction 30-35% and grade 1 diastolic dysfunction. Nuclear study May 2017 showed ejection fraction 28%. There was a large inferior apical and inferior lateral wall infarct with no ischemia. Cardiac catheterization June 2017 showed a 65% followed by 99% right coronary artery. There was a 60% first marginal. LV function described as hyperdynamic. Medical therapy recommended. Since last seen, He denies dyspnea, chest pain, palpitations or syncope. His blood pressure continues to run low on dialysis days. He is holding his Coreg in the morning.   Current Outpatient Prescriptions  Medication Sig Dispense Refill  . amiodarone (PACERONE) 200 MG tablet Take 1 tablet (200 mg total) by mouth daily. 90 tablet 3  . calcium citrate-vitamin D (CITRACAL+D) 315-200 MG-UNIT per tablet Take 1 tablet by mouth daily.     . carvedilol (COREG) 6.25 MG tablet Take 1 tablet (6.25 mg total) by mouth 2 (two) times daily. 180 tablet 3  . docusate sodium (COLACE) 100 MG capsule Take 100 mg by mouth at bedtime as needed for moderate constipation (constipation). Reported on 08/03/2015    . Elbasvir-Grazoprevir (ZEPATIER) 50-100 MG TABS Take 1 tablet by mouth daily. 28 tablet 2  . famotidine (PEPCID) 20 MG tablet Take 20 mg before breakfast and 20 mg before supper 60 tablet 6  . fish oil-omega-3 fatty acids 1000 MG capsule Take 1 g by mouth daily.    . fluticasone (FLONASE) 50 MCG/ACT nasal spray Place 1 spray into both  nostrils daily as needed for allergies. 16 g 3  . hydroxypropyl methylcellulose (ISOPTO TEARS) 2.5 % ophthalmic solution Place 2 drops into both eyes daily as needed for dry eyes. Reported on 04/20/2015    . levothyroxine (SYNTHROID, LEVOTHROID) 25 MCG tablet Take 1 tablet (25 mcg total) by mouth daily before breakfast. 90 tablet 3  . Multiple Vitamins-Minerals (MULTIVITAMIN WITH MINERALS) tablet Take 1 tablet by mouth daily.    . ondansetron (ZOFRAN) 4 MG tablet Take 1 tablet (4 mg total) by mouth every 8 (eight) hours as needed for nausea or vomiting. 30 tablet 1  . oxyCODONE-acetaminophen (ROXICET) 5-325 MG tablet Take 1 tablet by mouth every 6 (six) hours as needed. 6 tablet 0  . pravastatin (PRAVACHOL) 40 MG tablet Take 1 tablet (40 mg total) by mouth every morning. 30 tablet 6  . warfarin (COUMADIN) 2.5 MG tablet Take 1-1.5 tablets (2.5-3.75 mg total) by mouth daily at 6 PM. 3.75 mg on Monday.  2.5 mg all other days 32 tablet 2   No current facility-administered medications for this visit.      Past Medical History:  Diagnosis Date  . Arthritis    "fingers" (02/07/2015)  . Atrial flutter (HCC) 07/26/2014   a. May 2016, CHA2DS2VASc = 4 -> Eliquis, spontaneous conversion to NSR;  b. On amio;  c. 01/2015 EPS: Unable to induce right sided Aflutter. Non-sustained Afib and Left sided Aflutter noted.  . Benign prostatic hypertrophy with nocturia 07/07/2006  . Cardiomyopathy (HCC)    a. 07/2014 Echo: EF 50%; b. 03/2015 Ech: EF 20-25%; c.  07/2015 Echo: EF 30-35%, Gr1 DD, sev LVH.  [name] Chronic venous insufficiency 04/12/2010  . Closed displaced fracture of left femoral neck (HCC) 06/14/2014   s/p left hip hemiarthroplasty June 14, 2014   . Constipation 05/25/2009   Intermittent   . Coronary artery disease 04/02/2006   a. s/p RCA DES (2.5 x 24 mm TAXUS drug-eluting stent) 2006;  b. low risk myoview in 2011; c. 5/17 MV: EF 28%, large inf apical and inflat infarct w/o ischemia.  . Degenerative joint disease  involving multiple joints 02/02/2007  . End stage renal disease on dialysis [facility])    a. TTS Dialysis - currently through right chest diatek, pending AVF.  [name] GERD (gastroesophageal reflux disease)   . Hepatitis    Hep C  . History of blood transfusion 05/2014   "related to hip OR"  . Hyperlipidemia 04/02/2006  . Hypertension   . Hypertensive heart disease 12/09/2006  . Hypothyroidism   . Internal and external hemorrhoids without complication 08/20/2012  . Microcytic normochromic anemia 05/27/2006  . Obesity (BMI 30.0-34.9) 01/15/2012  . 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 Type 2 diabetes mellitus with peripheral artery disease (HCC) 05/27/2006   Absent pulses in the left foot   . 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 AV FISTULA PLACEMENT Left 10/08/2015   Procedure:  BRACHIOCEPHALIC ARTERIOVENOUS (AV) FISTULA CREATION Right Arm;  Surgeon: Fransisco Hertz, MD;  Location: [facility] OR;  Service: Vascular;  Laterality: Left;  . CARDIAC CATHETERIZATION N/A 09/21/2015   Procedure: Left Heart Cath and Coronary Angiography;  Surgeon: Marykay Lex, MD;  Location: [facility] INVASIVE CV LAB;  Service: Cardiovascular;  Laterality: N/A;  . CHOLECYSTECTOMY N/A 12/12/2014   Procedure: LAPAROSCOPIC CHOLECYSTECTOMY WITH INTRAOPERATIVE CHOLANGIOGRAM;  Surgeon: Gaynelle Adu, MD;  Location: [facility] OR;  Service: General;  Laterality: N/A;  . CORONARY ANGIOPLASTY WITH STENT PLACEMENT    . ELECTROPHYSIOLOGIC STUDY N/A 02/07/2015   Procedure: A-Flutter Ablation;  Surgeon: Marinus Maw, MD;  Location: [facility] INVASIVE CV LAB;  Service: Cardiovascular;  Laterality: N/A;  . FRACTURE SURGERY    . INSERTION OF  DIALYSIS CATHETER Right 06/13/2015   Procedure: INSERTION OF DIALYSIS CATHETER RIGHT INTERNAL JUGULAR;  Surgeon: Larina Earthly, MD;  Location: [facility] OR;  Service: Vascular;  Laterality: Right;  . JOINT REPLACEMENT    . LEG AMPUTATION ABOVE KNEE Right ~ 2008  . PILONIDAL CYST EXCISION  1990's  . PROSTATE BIOPSY  ~ 2013  . REFRACTIVE SURGERY Bilateral   . REMOVAL OF A DIALYSIS CATHETER Right 06/13/2015   Procedure: REMOVAL OF A DIALYSIS CATHETER;  Surgeon: Larina Earthly, MD;  Location: [facility] OR;  Service: Vascular;  Laterality: Right;  . TONSILLECTOMY    . TOTAL HIP ARTHROPLASTY Left 06/14/2014   Procedure: HEMI HIP ARTHROPLASTY ANTERIOR APPROACH;  Surgeon: Tarry Kos, MD;  Location: [facility] OR;  Service: Orthopedics;  Laterality: Left;    Social History   Social History  . Marital status: Married    Spouse name: N/A  . Number of children: N/A  . Years of education: N/A   Occupational History  . Not on file.   Social History Main Topics  .  Smoking status: Former Smoker    Packs/day: 1.00    Years: 10.00    Types: Cigarettes    Quit date: 06/16/1968  . Smokeless tobacco: Never Used  . Alcohol use No     Comment: "quit alcohol in the 1960's"  . Drug use: No  . Sexual activity: Not Currently    Birth control/ protection: None   Other Topics Concern  . Not on file   Social History Narrative  . No narrative on file    Family History  Problem Relation Age of Onset  . Hypertension Mother   . Heart attack Father   . Breast cancer Sister   . Arthritis Sister   . Heart attack Brother   . Diabetes Brother   . Stroke Brother   . Pneumonia Daughter   . Diabetes Brother   . Alcoholism Brother   . Diabetes Brother   . Arthritis Sister     Bilateral knee replacement  . HIV Daughter   . Hypertension Daughter   . Drug abuse Daughter   . Schizophrenia Daughter   . Hypertension Daughter   . Drug abuse Daughter   . Bipolar disorder Daughter     ROS: no fevers or chills, productive  cough, hemoptysis, dysphasia, odynophagia, melena, hematochezia, dysuria, hematuria, rash, seizure activity, orthopnea, PND, pedal edema, claudication. Remaining systems are negative.  Physical Exam: Well-developed well-nourished in no acute distress.  Skin is warm and dry.  HEENT is normal.  Neck is supple. Bilateral bruits Chest is clear to auscultation with normal expansion.  Cardiovascular exam is regular rate and rhythm.  Abdominal exam nontender or distended. No masses palpated. Extremities show no edema. neuro grossly intact  A/P  1 Bruits-schedule carotid Dopplers to further assess.   2 coronary artery disease-continue statin. No aspirin given need for Coumadin.  3 atrial flutter-patient remains in sinus rhythm on examination. Continue amiodarone and Coumadin.  4 hyperlipidemia-continue statin.  5 ischemic cardiomyopathy-continue Coreg. Improved on most recent evaluation by catheterization.  6 end-stage renal disease-management per nephrology.  Olga Millers, MD

## 2015-10-25 ENCOUNTER — Ambulatory Visit: Payer: Medicare Other

## 2015-11-01 ENCOUNTER — Ambulatory Visit: Payer: Medicare Other | Admitting: Cardiology

## 2015-11-01 MED FILL — *ZEPATIER 50-100 MG TABLET: 50-100 | 28 days supply | Qty: 28 | Fill #1

## 2015-11-01 NOTE — Progress Notes (Signed)
I have reviewed Dr. Elmyra RicksKim's note. [facility].  He is being taken off for a procedure as documented in Dr. Elmyra RicksKim's note.

## 2015-11-02 ENCOUNTER — Encounter: Payer: Self-pay | Admitting: Cardiology

## 2015-11-02 ENCOUNTER — Ambulatory Visit (INDEPENDENT_AMBULATORY_CARE_PROVIDER_SITE_OTHER): Payer: Medicare Other | Admitting: Cardiology

## 2015-11-02 ENCOUNTER — Encounter (INDEPENDENT_AMBULATORY_CARE_PROVIDER_SITE_OTHER): Payer: Self-pay

## 2015-11-02 VITALS — BP 130/70 | HR 61 | Ht 68.0 in | Wt 167.0 lb

## 2015-11-02 DIAGNOSIS — I4892 Unspecified atrial flutter: Secondary | ICD-10-CM

## 2015-11-02 DIAGNOSIS — R0989 Other specified symptoms and signs involving the circulatory and respiratory systems: Secondary | ICD-10-CM | POA: Diagnosis not present

## 2015-11-02 DIAGNOSIS — I2510 Atherosclerotic heart disease of native coronary artery without angina pectoris: Secondary | ICD-10-CM

## 2015-11-02 NOTE — Patient Instructions (Signed)
Medication Instructions:  Your physician recommends that you continue on your current medications as directed. Please refer to the Current Medication list given to you today.   Labwork: -None  Testing/Procedures: Your physician has requested that you have a carotid duplex. DX: BRUIT This test is an ultrasound of the carotid arteries in your neck. It looks at blood flow through these arteries that supply the brain with blood. Allow one hour for this exam. There are no restrictions or special instructions.        Follow-Up: Your physician wants you to follow-up in: 6 months with Dr. Shelda Palrenshaw You will receive a reminder letter in the mail two months in advance. If you don't receive a letter, please call our office to schedule the follow-up appointment.   Any Other Special Instructions Will Be Listed Below (If Applicable).     If you need a refill on your cardiac medications before your next appointment, please call your pharmacy.

## 2015-11-05 ENCOUNTER — Ambulatory Visit (INDEPENDENT_AMBULATORY_CARE_PROVIDER_SITE_OTHER): Payer: Medicare Other | Admitting: Pharmacist

## 2015-11-05 ENCOUNTER — Encounter (INDEPENDENT_AMBULATORY_CARE_PROVIDER_SITE_OTHER): Payer: Self-pay

## 2015-11-05 DIAGNOSIS — Z7901 Long term (current) use of anticoagulants: Secondary | ICD-10-CM

## 2015-11-05 DIAGNOSIS — I480 Paroxysmal atrial fibrillation: Secondary | ICD-10-CM

## 2015-11-05 LAB — POCT INR: INR: 1.8

## 2015-11-05 NOTE — Progress Notes (Signed)
Anti-Coagulation Progress Note  [name] [name] is a 70 y.o. male who is currently on an anti-coagulation regimen.    RECENT RESULTS: Recent results are below, the most recent result is correlated with a dose of 20 mg. per week: Lab Results  Component Value Date   INR 1.80 11/05/2015   INR 1.7 10/22/2015   INR 1.41 10/08/2015    ANTI-COAG DOSE: Anticoagulation Dose Instructions as of 11/05/2015      Glynis SmilesSun Mon Tue Wed Thu Fri Sat   New Dose 3.75 mg 2.5 mg 3.75 mg 3.75 mg 3.75 mg 3.75 mg 2.5 mg       ANTICOAG SUMMARY: Anticoagulation Episode Summary    Current INR goal:   2.0-3.0  TTR:   57.1 % (4.2 mo)  Next INR check:   11/19/2015  INR from last check:   1.80! (11/05/2015)  Weekly max dose:     Target end date:     INR check location:     Preferred lab:     Send INR reminders to:        Comments:           ANTICOAG TODAY: Anticoagulation Summary  As of 11/05/2015   INR goal:   2.0-3.0  TTR:     Today's INR:   1.80!  Next INR check:   11/19/2015  Target end date:         Anticoagulation Episode Summary    INR check location:      Preferred lab:      Send INR reminders to:      Comments:         PATIENT INSTRUCTIONS: There are no Patient Instructions on file for this visit.   FOLLOW-UP Return in about 2 weeks (around 11/19/2015) for Follow up INR at 0915h.  Hulen LusterJames Sahan Pen, III Pharm.D., CACP

## 2015-11-05 NOTE — Progress Notes (Signed)
INTERNAL MEDICINE TEACHING ATTENDING ADDENDUM - Earl LagosNischal Gwenneth Whiteman M.D  Duration- indefinite, Indication- paroxysmal aflutter, INR- sub therapeutic. Agree with pharmacy recommendations as outlined in their note.

## 2015-11-05 NOTE — Patient Instructions (Signed)
Patient instructed to take medications as defined in the Anti-coagulation Track section of this encounter.  Patient instructed to take today's dose.  Patient verbalized understanding of these instructions.    

## 2015-11-07 ENCOUNTER — Ambulatory Visit (HOSPITAL_COMMUNITY)
Admission: RE | Admit: 2015-11-07 | Discharge: 2015-11-07 | Disposition: A | Discharge status: Home / Self Care | Payer: Medicare Other | Source: Ambulatory Visit | Attending: Internal Medicine | Admitting: Internal Medicine

## 2015-11-07 ENCOUNTER — Encounter (HOSPITAL_COMMUNITY): Payer: Self-pay

## 2015-11-07 DIAGNOSIS — B182 Chronic viral hepatitis C: Secondary | ICD-10-CM

## 2015-11-09 ENCOUNTER — Other Ambulatory Visit: Payer: Medicare Other

## 2015-11-09 ENCOUNTER — Ambulatory Visit (HOSPITAL_COMMUNITY)
Admission: RE | Admit: 2015-11-09 | Discharge: 2015-11-09 | Disposition: A | Discharge status: Home / Self Care | Payer: Medicare Other | Source: Ambulatory Visit | Attending: Cardiovascular Disease | Admitting: Cardiovascular Disease

## 2015-11-09 DIAGNOSIS — B182 Chronic viral hepatitis C: Secondary | ICD-10-CM

## 2015-11-09 DIAGNOSIS — E785 Hyperlipidemia, unspecified: Secondary | ICD-10-CM | POA: Diagnosis not present

## 2015-11-09 DIAGNOSIS — E1151 Type 2 diabetes mellitus with diabetic peripheral angiopathy without gangrene: Secondary | ICD-10-CM | POA: Diagnosis not present

## 2015-11-09 DIAGNOSIS — E1139 Type 2 diabetes mellitus with other diabetic ophthalmic complication: Secondary | ICD-10-CM | POA: Insufficient documentation

## 2015-11-09 DIAGNOSIS — I429 Cardiomyopathy, unspecified: Secondary | ICD-10-CM | POA: Insufficient documentation

## 2015-11-09 DIAGNOSIS — E1122 Type 2 diabetes mellitus with diabetic chronic kidney disease: Secondary | ICD-10-CM | POA: Diagnosis not present

## 2015-11-09 DIAGNOSIS — E039 Hypothyroidism, unspecified: Secondary | ICD-10-CM | POA: Diagnosis not present

## 2015-11-09 DIAGNOSIS — E1149 Type 2 diabetes mellitus with other diabetic neurological complication: Secondary | ICD-10-CM | POA: Diagnosis not present

## 2015-11-09 DIAGNOSIS — I6523 Occlusion and stenosis of bilateral carotid arteries: Secondary | ICD-10-CM

## 2015-11-09 DIAGNOSIS — R0989 Other specified symptoms and signs involving the circulatory and respiratory systems: Secondary | ICD-10-CM

## 2015-11-09 DIAGNOSIS — I2510 Atherosclerotic heart disease of native coronary artery without angina pectoris: Secondary | ICD-10-CM | POA: Insufficient documentation

## 2015-11-09 DIAGNOSIS — K219 Gastro-esophageal reflux disease without esophagitis: Secondary | ICD-10-CM | POA: Insufficient documentation

## 2015-11-09 DIAGNOSIS — I1311 Hypertensive heart and chronic kidney disease without heart failure, with stage 5 chronic kidney disease, or end stage renal disease: Secondary | ICD-10-CM | POA: Insufficient documentation

## 2015-11-09 DIAGNOSIS — N186 End stage renal disease: Secondary | ICD-10-CM | POA: Insufficient documentation

## 2015-11-12 LAB — HEPATITIS C RNA QUANTITATIVE: HCV QUANT: NOT DETECTED [IU]/mL (ref <15)

## 2015-11-13 ENCOUNTER — Other Ambulatory Visit: Payer: Self-pay | Admitting: Internal Medicine

## 2015-11-13 DIAGNOSIS — R112 Nausea with vomiting, unspecified: Secondary | ICD-10-CM

## 2015-11-19 ENCOUNTER — Encounter (HOSPITAL_COMMUNITY): Payer: Self-pay | Admitting: Physical Medicine and Rehabilitation

## 2015-11-19 ENCOUNTER — Emergency Department (HOSPITAL_COMMUNITY): Payer: Medicare Other

## 2015-11-19 ENCOUNTER — Ambulatory Visit: Payer: Medicare Other

## 2015-11-19 ENCOUNTER — Emergency Department (HOSPITAL_COMMUNITY)
Admission: EM | Admit: 2015-11-19 | Discharge: 2015-11-19 | Disposition: A | Discharge status: Home / Self Care | Payer: Medicare Other | Attending: Emergency Medicine | Admitting: Emergency Medicine

## 2015-11-19 DIAGNOSIS — I132 Hypertensive heart and chronic kidney disease with heart failure and with stage 5 chronic kidney disease, or end stage renal disease: Secondary | ICD-10-CM | POA: Insufficient documentation

## 2015-11-19 DIAGNOSIS — I509 Heart failure, unspecified: Secondary | ICD-10-CM | POA: Insufficient documentation

## 2015-11-19 DIAGNOSIS — Z96642 Presence of left artificial hip joint: Secondary | ICD-10-CM | POA: Insufficient documentation

## 2015-11-19 DIAGNOSIS — E1122 Type 2 diabetes mellitus with diabetic chronic kidney disease: Secondary | ICD-10-CM | POA: Diagnosis not present

## 2015-11-19 DIAGNOSIS — N186 End stage renal disease: Secondary | ICD-10-CM | POA: Diagnosis not present

## 2015-11-19 DIAGNOSIS — Z955 Presence of coronary angioplasty implant and graft: Secondary | ICD-10-CM | POA: Insufficient documentation

## 2015-11-19 DIAGNOSIS — Z992 Dependence on renal dialysis: Secondary | ICD-10-CM | POA: Diagnosis not present

## 2015-11-19 DIAGNOSIS — E039 Hypothyroidism, unspecified: Secondary | ICD-10-CM | POA: Insufficient documentation

## 2015-11-19 DIAGNOSIS — Z87891 Personal history of nicotine dependence: Secondary | ICD-10-CM | POA: Insufficient documentation

## 2015-11-19 DIAGNOSIS — I2510 Atherosclerotic heart disease of native coronary artery without angina pectoris: Secondary | ICD-10-CM | POA: Insufficient documentation

## 2015-11-19 DIAGNOSIS — R079 Chest pain, unspecified: Secondary | ICD-10-CM

## 2015-11-19 DIAGNOSIS — Z7901 Long term (current) use of anticoagulants: Secondary | ICD-10-CM | POA: Insufficient documentation

## 2015-11-19 DIAGNOSIS — E1140 Type 2 diabetes mellitus with diabetic neuropathy, unspecified: Secondary | ICD-10-CM | POA: Insufficient documentation

## 2015-11-19 LAB — COMPREHENSIVE METABOLIC PANEL
ALT: 18 U/L (ref 17–63)
AST: 27 U/L (ref 15–41)
Albumin: 3.2 g/dL — ABNORMAL LOW (ref 3.5–5.0)
Alkaline Phosphatase: 87 U/L (ref 38–126)
Anion gap: 12 (ref 5–15)
BUN: 48 mg/dL — ABNORMAL HIGH (ref 6–20)
CALCIUM: 9.1 mg/dL (ref 8.9–10.3)
CHLORIDE: 96 mmol/L — AB (ref 101–111)
CO2: 27 mmol/L (ref 22–32)
CREATININE: 6.47 mg/dL — AB (ref 0.61–1.24)
GFR, EST AFRICAN AMERICAN: 9 mL/min — AB (ref >60)
GFR, EST NON AFRICAN AMERICAN: 8 mL/min — AB (ref >60)
Glucose, Bld: 286 mg/dL — ABNORMAL HIGH (ref 65–99)
Potassium: 3.5 mmol/L (ref 3.5–5.1)
Sodium: 135 mmol/L (ref 135–145)
TOTAL PROTEIN: 7.9 g/dL (ref 6.5–8.1)
Total Bilirubin: 0.7 mg/dL (ref 0.3–1.2)

## 2015-11-19 LAB — CBC
HCT: 36.2 % — ABNORMAL LOW (ref 39.0–52.0)
Hemoglobin: 11.3 g/dL — ABNORMAL LOW (ref 13.0–17.0)
MCH: 25.2 pg — AB (ref 26.0–34.0)
MCHC: 31.2 g/dL (ref 30.0–36.0)
MCV: 80.8 fL (ref 78.0–100.0)
PLATELETS: 82 10*3/uL — AB (ref 150–400)
RBC: 4.48 MIL/uL (ref 4.22–5.81)
RDW: 17.1 % — ABNORMAL HIGH (ref 11.5–15.5)
WBC: 8.1 10*3/uL (ref 4.0–10.5)

## 2015-11-19 LAB — I-STAT TROPONIN, ED
TROPONIN I, POC: 0.02 ng/mL (ref 0.00–0.08)
Troponin i, poc: 0.02 ng/mL (ref 0.00–0.08)

## 2015-11-19 LAB — PROTIME-INR
INR: 1.89
PROTHROMBIN TIME: 21.9 s — AB (ref 11.4–15.2)

## 2015-11-19 LAB — LIPASE, BLOOD: Lipase: 23 U/L (ref 11–51)

## 2015-11-19 LAB — D-DIMER, QUANTITATIVE: D-Dimer, Quant: 0.61 ug/mL-FEU — ABNORMAL HIGH (ref 0.00–0.50)

## 2015-11-19 MED ORDER — LIDOCAINE VISCOUS 2 % MT SOLN
15.0000 mL | Freq: Once | OROMUCOSAL | Status: DC
  Filled 2015-11-19: qty 15

## 2015-11-19 MED ORDER — GI COCKTAIL ~~LOC~~
30.0000 mL | Freq: Once | ORAL | Status: AC
  Administered 2015-11-19: 30 mL via ORAL
  Filled 2015-11-19: qty 30

## 2015-11-19 MED ORDER — ALUM & MAG HYDROXIDE-SIMETH 200-200-20 MG/5ML PO SUSP
15.0000 mL | Freq: Once | ORAL | Status: DC
  Filled 2015-11-19: qty 30

## 2015-11-19 MED ORDER — ASPIRIN 81 MG PO CHEW
324.0000 mg | CHEWABLE_TABLET | Freq: Once | ORAL | Status: AC
  Administered 2015-11-19: 324 mg via ORAL
  Filled 2015-11-19: qty 4

## 2015-11-19 NOTE — ED Triage Notes (Signed)
Pt reports L sided chest pain, onset this morning. States pain radiates to upper back. HD patient, treatments on Tuesday, Thursday and Saturday.

## 2015-11-19 NOTE — ED Notes (Signed)
Pain that goes from back to chest started yesterday , no n/v/sob, last dia;lysis was sat  Has cath in chest

## 2015-11-19 NOTE — ED Provider Notes (Signed)
[facility]-EMERGENCY DEPT Provider Note   CSN: [id number] Arrival date & time: 11/19/15  1191   History   Chief Complaint Chief Complaint  Patient presents with  . Chest Pain    HPI [name] [name] is a 70 y.o. male.  The history is provided by the patient, the spouse and medical records.    70 year old male with history of CAD s/p placement of DES, Hep C, GERD, ESRD on Tuesday/Thursday/Saturday HD, atrial flutter/A. fib on Coumadin, DM, HTN, HLD, hypothyroidism, chronic venous insufficiency, RLE prosthetic leg presenting with chest pain. Onset was today in the morning when awakening. Located in left chest. Radiating to back. Described as soreness. Somewhat worse with deep inspiration. Mild severity currently. Patient's wife endorses a recent cough although patient denies cough or shortness of breath recently, no fevers, no diaphoresis. Had some dietary indiscretion last night and thinks it may be his reflux but wanted to make sure his heart was ok. Denies hx of DVT/PE, is on coumadin currently but states dose was recently increased as his INR was just found to be low.  His last HD was Saturday and he has not missed any.   Past Medical History:  Diagnosis Date  . Arthritis    "fingers" (02/07/2015)  . Atrial flutter (HCC) 07/26/2014   a. May 2016, CHA2DS2VASc = 4 -> Eliquis, spontaneous conversion to NSR;  b. On amio;  c. 01/2015 EPS: Unable to induce right sided Aflutter. Non-sustained Afib and Left sided Aflutter noted.  . Benign prostatic hypertrophy with nocturia 07/07/2006  . Cardiomyopathy (HCC)    a. 07/2014 Echo: EF 50%; b. 03/2015 Ech: EF 20-25%; c. 07/2015 Echo: EF 30-35%, Gr1 DD, sev LVH.  [name] Chronic venous insufficiency 04/12/2010  . Closed displaced fracture of left femoral neck (HCC) 06/14/2014   s/p left hip hemiarthroplasty June 14, 2014   . Constipation 05/25/2009   Intermittent   . Coronary artery disease 04/02/2006   a. s/p RCA DES (2.5 x 24 mm TAXUS drug-eluting stent) 2006;   b. low risk myoview in 2011; c. 5/17 MV: EF 28%, large inf apical and inflat infarct w/o ischemia.  . Degenerative joint disease involving multiple joints 02/02/2007  . End stage renal disease on dialysis [facility])    a. TTS Dialysis - currently through right chest diatek, pending AVF.  [name] GERD (gastroesophageal reflux disease)   . Hepatitis    Hep C  . History of blood transfusion 05/2014   "related to hip OR"  . Hyperlipidemia 04/02/2006  . Hypertension   . Hypertensive heart disease 12/09/2006  . Hypothyroidism   . Internal and external hemorrhoids without complication 08/20/2012  . Microcytic normochromic anemia 05/27/2006  . Obesity (BMI 30.0-34.9) 01/15/2012  . 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 Type 2 diabetes mellitus with peripheral artery disease (HCC) 05/27/2006   Absent pulses in the left foot   . Type 2 diabetes mellitus with stage 5 chronic kidney disease (HCC) 08/06/2012    Patient Active Problem List   Diagnosis Date Noted  . Congestive dilated cardiomyopathy (HCC) 09/21/2015  . Abnormal nuclear stress test 09/21/2015  . Hypertensive heart disease with congestive heart failure and chronic kidney disease (HCC) 09/21/2015  . Hyperlipidemia 08/10/2015  . End stage renal disease on dialysis (HCC)   .  Heart failure with reduced ejection fraction (HCC) 04/10/2015  . Hypothyroidism, acquired 12/12/2014  . Chronic hepatitis C without hepatic coma (HCC) 12/11/2014  . Aortic atherosclerosis (HCC) 11/30/2014  . Right bundle branch block (RBBB) 07/27/2014  . Paroxysmal atrial flutter (HCC) 07/26/2014  . Gastroesophageal reflux disease without esophagitis 07/01/2013  . Internal and external hemorrhoids without complication 08/20/2012  . Type 2 diabetes  mellitus with end-stage renal disease (HCC) 08/06/2012  . Overweight (BMI 25.0-29.9) 01/15/2012  . Healthcare maintenance 12/12/2010  . Chronic venous insufficiency 04/12/2010  . Seasonal allergic rhinitis 05/25/2009  . Degenerative joint disease involving multiple joints 02/02/2007  . Essential hypertension 12/09/2006  . 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 Benign prostatic hypertrophy with nocturia 07/07/2006  . Anemia of chronic renal failure, stage 5 (HCC) 05/27/2006  . Type 2 diabetes mellitus with peripheral artery disease (HCC) 05/27/2006  . Coronary artery disease of native artery with stable angina pectoris (HCC) 04/02/2006    Past Surgical History:  Procedure Laterality Date  . AV FISTULA PLACEMENT Left 10/08/2015   Procedure:  BRACHIOCEPHALIC ARTERIOVENOUS (AV) FISTULA CREATION Right Arm;  Surgeon: Fransisco Hertz, MD;  Location: [facility] OR;  Service: Vascular;  Laterality: Left;  . CARDIAC CATHETERIZATION N/A 09/21/2015   Procedure: Left Heart Cath and Coronary Angiography;  Surgeon: Marykay Lex, MD;  Location: [facility] INVASIVE CV LAB;  Service: Cardiovascular;  Laterality: N/A;  . CHOLECYSTECTOMY N/A 12/12/2014   Procedure: LAPAROSCOPIC CHOLECYSTECTOMY WITH INTRAOPERATIVE CHOLANGIOGRAM;  Surgeon: Gaynelle Adu, MD;  Location: [facility] OR;  Service: General;  Laterality: N/A;  . CORONARY ANGIOPLASTY WITH STENT PLACEMENT    . ELECTROPHYSIOLOGIC STUDY N/A 02/07/2015   Procedure: A-Flutter Ablation;  Surgeon: Marinus Maw, MD;  Location: [facility] INVASIVE CV LAB;  Service: Cardiovascular;  Laterality: N/A;  . FRACTURE SURGERY    . INSERTION OF DIALYSIS CATHETER Right 06/13/2015   Procedure: INSERTION OF DIALYSIS CATHETER RIGHT INTERNAL JUGULAR;  Surgeon: Larina Earthly, MD;  Location: [facility] OR;  Service: Vascular;  Laterality: Right;  . JOINT REPLACEMENT    . LEG AMPUTATION ABOVE KNEE Right ~ 2008  . PILONIDAL CYST EXCISION  1990's  . PROSTATE  BIOPSY  ~ 2013  . REFRACTIVE SURGERY Bilateral   . REMOVAL OF A DIALYSIS CATHETER Right 06/13/2015   Procedure: REMOVAL OF A DIALYSIS CATHETER;  Surgeon: Larina Earthly, MD;  Location: [facility] OR;  Service: Vascular;  Laterality: Right;  . TONSILLECTOMY    . TOTAL HIP ARTHROPLASTY Left 06/14/2014   Procedure: HEMI HIP ARTHROPLASTY ANTERIOR APPROACH;  Surgeon: Tarry Kos, MD;  Location: [facility] OR;  Service: Orthopedics;  Laterality: Left;       Home Medications    Prior to Admission medications   Medication Sig Start Date End Date Taking? Authorizing Provider  amiodarone (PACERONE) 200 MG tablet Take 1 tablet (200 mg total) by mouth daily. 10/01/15  Yes Doneen Poisson, MD  calcium citrate-vitamin D (CITRACAL+D) 315-200 MG-UNIT per tablet Take 1 tablet by mouth daily.    Yes Historical Provider, MD  carvedilol (COREG) 6.25 MG tablet Take 1 tablet (6.25 mg total) by mouth 2 (two) times daily. 08/10/15  Yes Lewayne Bunting, MD  docusate sodium (COLACE) 100 MG capsule Take 100 mg by mouth at bedtime as needed for moderate constipation (constipation). Reported on 08/03/2015   Yes Historical Provider, MD  Elbasvir-Grazoprevir (ZEPATIER) 50-100 MG TABS Take 1 tablet by mouth daily. 09/19/15  Yes Gardiner Barefoot, MD  famotidine (PEPCID) 20 MG tablet Take 20 mg before breakfast and 20 mg before supper 04/16/15  Yes Rhonda G Barrett, PA-C  fish oil-omega-3 fatty acids 1000 MG capsule Take 1 g by mouth daily. 01/15/12  Yes Doneen Poisson, MD  fluticasone (FLONASE) 50 MCG/ACT nasal spray Place 1 spray into both nostrils daily as needed for allergies. 01/19/15  Yes Doneen Poisson, MD  hydroxypropyl methylcellulose (ISOPTO TEARS) 2.5 % ophthalmic solution Place 2 drops into both eyes daily as needed for dry eyes. Reported on 04/20/2015   Yes Historical Provider, MD  levothyroxine (SYNTHROID, LEVOTHROID) 25 MCG tablet Take 1 tablet (25 mcg total) by mouth daily before breakfast. 06/01/15  Yes Doneen Poisson, MD  Multiple  Vitamins-Minerals (MULTIVITAMIN WITH MINERALS) tablet Take 1 tablet by mouth daily.   Yes Historical Provider, MD  ondansetron (ZOFRAN) 4 MG tablet Take 1 tablet (4 mg total) by mouth every 8 (eight) hours as needed for nausea. 11/13/15  Yes Doneen Poisson, MD  warfarin (COUMADIN) 2.5 MG tablet Take 1-1.5 tablets (2.5-3.75 mg total) by mouth daily at 6 PM. 3.75 mg on Monday.  2.5 mg all other days 10/08/15  Yes Raymond Gurney, PA-C  warfarin (COUMADIN) 2.5 MG tablet Take 2.5-3.75 mg by mouth See admin instructions. 2.5 mg on Monday and Saturday all other days are 3.75 mg ( 1 and 1.5 tablets)   Yes Historical Provider, MD    Family History Family History  Problem Relation Age of Onset  . Hypertension Mother   . Heart attack Father   . Breast cancer Sister   . Arthritis Sister   . Heart attack Brother   . Diabetes Brother   . Stroke Brother   . Pneumonia Daughter   . Diabetes Brother   . Alcoholism Brother   . Diabetes Brother   . Arthritis Sister     Bilateral knee replacement  . HIV Daughter   . Hypertension Daughter   . Drug abuse Daughter   . Schizophrenia Daughter   . Hypertension Daughter   . Drug abuse Daughter   . Bipolar disorder Daughter     Social History Social History  Substance Use Topics  . Smoking status: Former Smoker    Packs/day: 1.00    Years: 10.00    Types: Cigarettes    Quit date: 06/16/1968  . Smokeless tobacco: Never Used  . Alcohol use No     Comment: "quit alcohol in the 1960's"     Allergies   Lisinopril; Amoxicillin; Pravastatin; Tamsulosin; and Doxycycline   Review of Systems Review of Systems  Constitutional: Negative for chills and fever.  Respiratory: Positive for cough. Negative for shortness of breath.   Cardiovascular: Positive for chest pain. Negative for leg swelling.  Gastrointestinal: Negative for abdominal pain, nausea and vomiting.  Genitourinary: Negative for decreased urine volume (ESRD).  Musculoskeletal: Negative for  joint swelling.  Skin: Negative for rash.  Allergic/Immunologic: Negative for immunocompromised state.  Neurological: Negative for light-headedness and headaches.  Hematological: Bruises/bleeds easily.  All other systems reviewed and are negative.   Physical Exam Updated Vital Signs BP 153/78 (BP Location: Right Arm)   Pulse (!) 56   Temp 97.5 F (36.4 C) (Oral)   Resp 14   Ht 5\' 8"  (1.727 m)   Wt 77.1 kg   SpO2 100%   BMI 25.85 kg/m   Physical Exam  Constitutional: He appears well-developed and well-nourished.  HENT:  Head: Normocephalic and atraumatic.  Eyes: Conjunctivae are normal.  Neck: Neck supple.  Cardiovascular: Normal rate and regular rhythm.   No murmur heard. Pulmonary/Chest: Effort normal. No respiratory distress. He has rales (bibasilar crackles). He exhibits no tenderness.  Abdominal: Soft. There is no tenderness.  Musculoskeletal: He exhibits no edema.  RLE prosthetic  Neurological: He is alert.  Skin: Skin is warm and dry.  Psychiatric: He has a normal mood and affect.  Nursing note and vitals reviewed.   ED Treatments / Results  Labs (all labs ordered are listed, but only abnormal results are displayed) Labs Reviewed  CBC - Abnormal; Notable for the following:       Result Value   Hemoglobin 11.3 (*)    HCT 36.2 (*)    MCH 25.2 (*)    RDW 17.1 (*)    Platelets 82 (*)    All other components within normal limits  COMPREHENSIVE METABOLIC PANEL - Abnormal; Notable for the following:    Chloride 96 (*)    Glucose, Bld 286 (*)    BUN 48 (*)    Creatinine, Ser 6.47 (*)    Albumin 3.2 (*)    GFR calc non Af Amer 8 (*)    GFR calc Af Amer 9 (*)    All other components within normal limits  PROTIME-INR - Abnormal; Notable for the following:    Prothrombin Time 21.9 (*)    All other components within normal limits  D-DIMER, QUANTITATIVE (NOT AT [facility]) - Abnormal; Notable for the following:    D-Dimer, Quant 0.61 (*)    All other components  within normal limits  LIPASE, BLOOD  I-STAT TROPOININ, ED  I-STAT TROPOININ, ED    EKG  EKG Interpretation  Date/Time:  Monday November 19 2015 10:25:34 EDT Ventricular Rate:  57 PR Interval:    QRS Duration: 169 QT Interval:  547 QTC Calculation: 533 R Axis:   -52 Text Interpretation:  Sinus rhythm Atrial premature complex RBBB and LAFB No significant change since last tracing Confirmed by Anitra LauthPLUNKETT  MD, Alphonzo LemmingsWHITNEY ([id number]) on 11/19/2015 10:30:24 AM       Radiology Dg Chest 2 View  Result Date: 11/19/2015 CLINICAL DATA:  Pt woke up this morning with left sided chest pain, posterior and anterior, after eating at [employer] last night Hx of DM- controlled my diet, HTN- on meds, ex-smoker, recently started dialysis. EXAM: CHEST  2 VIEW COMPARISON:  09/17/2015 FINDINGS: Cardiac silhouette is normal in size. There are dense coronary artery calcifications. Aorta is uncoiled. No mediastinal or hilar masses or evidence of adenopathy. Clear lungs.  No pleural effusion or pneumothorax. Right internal jugular dual-lumen central venous catheter has its distal tip projecting in the right atrium. This is stable. Bony thorax is demineralized but grossly intact. IMPRESSION: No acute cardiopulmonary disease. Electronically Signed   By: Amie Portlandavid  Ormond M.D.   On: 11/19/2015 10:49    Procedures Procedures (including critical care time)  Medications Ordered in ED Medications  aspirin chewable tablet 324 mg (324 mg Oral Given 11/19/15 1208)  gi cocktail (Maalox,Lidocaine,Donnatal) (30 mLs Oral Given 11/19/15 1208)    Initial Impression / Assessment and Plan / ED Course  I have reviewed the triage vital signs and the nursing notes.  Pertinent labs & imaging results that were available during my care of the patient were reviewed by me and considered in my medical decision making (see chart for details).  Clinical Course    70 year old male with history of CAD s/p placement of DES, Hep C, GERD, ESRD on  Tuesday/Thursday/Saturday HD, atrial flutter/A.  fib on Coumadin, DM, HTN, HLD, hypothyroidism, chronic venous insufficiency, RLE prosthetic leg presenting with chest pain as above.   No acute ischemic changes on EKG. Neg trop x2, doubt ACS. Patient is at risk for DVT/PE particularly given subtherapeutic INR recently, which is redemonstrated but improving today. However, his d-dimer is at age adjusted normal (and similar to priors), patient not hypoxic or tachycardic, doubt acute PE. Possibly 2/2 GERD given dietary indiscretion yesterday that pt thinks caused this episode of pain, and resolution of pain completely with GI cocktail. Return precautions discussed. Patient will follow up with his PCP. Will discuss low INR today with PCP, states doses recently increased for subtherapeutic INR. Discharged in stable condition.   Case discussed with Dr. Anitra Lauth, who oversaw management of this patient.   Final Clinical Impressions(s) / ED Diagnoses   Final diagnoses:  Chest pain, unspecified chest pain type    New Prescriptions New Prescriptions   No medications on file     Urban Gibson, MD 11/19/15 1501    Gwyneth Sprout, MD 11/19/15 2040

## 2015-11-19 NOTE — ED Notes (Signed)
Pt states "ready to go home"

## 2015-11-20 NOTE — Progress Notes (Signed)
    Postoperative Access Visit   History of Present Illness  [name] [name] is a 70 y.o. year old male who presents for postoperative follow-up for: R BC AVF (Date: 10/08/15).  The patient's wounds are healed.  The patient notes no steal symptoms.  The patient is able to complete their activities of daily living.  The patient's current symptoms are: none .  For VQI Use Only  PRE-ADM LIVING: Home  AMB STATUS: Wheelchair  Physical Examination Vitals:   11/23/15 0925  BP: 139/76  Pulse: 64    RUE: Incision is healed, skin feels warm, hand grip is 5/5, sensation in digits is intact, palpable thrill, bruit can be auscultated, fistula is visible, on Sonosite > 6 mm throughout, palpable radial pulse  Medical Decision Making  [name] [name] is a 70 y.o. year old male who presents s/p R BC AVF.   The patient's access is ready for use.  The patient's tunneled dialysis catheter can be removed after two successful cannulations and completed dialysis treatments.  Thank you for allowing us to participate in this patient's care.  Leonides SakeBrian Kameran Mcneese, MD, FACS [facility] Office: [phone] Pager: [phone]

## 2015-11-22 ENCOUNTER — Encounter: Payer: Self-pay | Admitting: Vascular Surgery

## 2015-11-23 ENCOUNTER — Encounter: Payer: Self-pay | Admitting: Vascular Surgery

## 2015-11-23 ENCOUNTER — Ambulatory Visit (INDEPENDENT_AMBULATORY_CARE_PROVIDER_SITE_OTHER): Payer: Medicare Other | Admitting: Vascular Surgery

## 2015-11-23 VITALS — BP 139/76 | HR 64 | Ht 68.0 in | Wt 170.0 lb

## 2015-11-23 DIAGNOSIS — N186 End stage renal disease: Secondary | ICD-10-CM

## 2015-11-23 DIAGNOSIS — Z992 Dependence on renal dialysis: Secondary | ICD-10-CM

## 2015-11-28 MED FILL — *ZEPATIER 50-100 MG TABLET: 50-100 | 28 days supply | Qty: 28 | Fill #2

## 2015-12-04 ENCOUNTER — Ambulatory Visit (HOSPITAL_COMMUNITY): Payer: Medicare Other

## 2015-12-05 ENCOUNTER — Ambulatory Visit (HOSPITAL_COMMUNITY)
Admission: RE | Admit: 2015-12-05 | Discharge: 2015-12-05 | Disposition: A | Discharge status: Home / Self Care | Payer: Medicare Other | Source: Ambulatory Visit | Attending: Internal Medicine | Admitting: Internal Medicine

## 2015-12-05 DIAGNOSIS — Z9049 Acquired absence of other specified parts of digestive tract: Secondary | ICD-10-CM | POA: Diagnosis not present

## 2015-12-05 DIAGNOSIS — B182 Chronic viral hepatitis C: Secondary | ICD-10-CM | POA: Insufficient documentation

## 2015-12-06 ENCOUNTER — Telehealth: Payer: Self-pay | Admitting: Internal Medicine

## 2015-12-06 NOTE — Telephone Encounter (Signed)
APT. REMINDER CALL, LMTCB °

## 2015-12-07 ENCOUNTER — Other Ambulatory Visit: Payer: Self-pay | Admitting: Pharmacist

## 2015-12-07 ENCOUNTER — Encounter: Payer: Self-pay | Admitting: Internal Medicine

## 2015-12-07 ENCOUNTER — Ambulatory Visit (INDEPENDENT_AMBULATORY_CARE_PROVIDER_SITE_OTHER): Payer: Medicare Other | Admitting: Internal Medicine

## 2015-12-07 VITALS — BP 126/64 | HR 62 | Temp 98.3°F | Wt 168.0 lb

## 2015-12-07 DIAGNOSIS — N186 End stage renal disease: Secondary | ICD-10-CM

## 2015-12-07 DIAGNOSIS — Z23 Encounter for immunization: Secondary | ICD-10-CM | POA: Diagnosis not present

## 2015-12-07 DIAGNOSIS — I700 Atherosclerosis of aorta: Secondary | ICD-10-CM

## 2015-12-07 DIAGNOSIS — N521 Erectile dysfunction due to diseases classified elsewhere: Secondary | ICD-10-CM

## 2015-12-07 DIAGNOSIS — Z7901 Long term (current) use of anticoagulants: Secondary | ICD-10-CM

## 2015-12-07 DIAGNOSIS — I480 Paroxysmal atrial fibrillation: Secondary | ICD-10-CM | POA: Diagnosis not present

## 2015-12-07 DIAGNOSIS — J302 Other seasonal allergic rhinitis: Secondary | ICD-10-CM

## 2015-12-07 DIAGNOSIS — I120 Hypertensive chronic kidney disease with stage 5 chronic kidney disease or end stage renal disease: Secondary | ICD-10-CM

## 2015-12-07 DIAGNOSIS — E1122 Type 2 diabetes mellitus with diabetic chronic kidney disease: Secondary | ICD-10-CM

## 2015-12-07 DIAGNOSIS — Z79899 Other long term (current) drug therapy: Secondary | ICD-10-CM

## 2015-12-07 DIAGNOSIS — I4892 Unspecified atrial flutter: Secondary | ICD-10-CM

## 2015-12-07 DIAGNOSIS — B182 Chronic viral hepatitis C: Secondary | ICD-10-CM

## 2015-12-07 DIAGNOSIS — I10 Essential (primary) hypertension: Secondary | ICD-10-CM

## 2015-12-07 DIAGNOSIS — E1159 Type 2 diabetes mellitus with other circulatory complications: Secondary | ICD-10-CM

## 2015-12-07 DIAGNOSIS — Z0000 Encounter for general adult medical examination without abnormal findings: Secondary | ICD-10-CM

## 2015-12-07 DIAGNOSIS — Z87891 Personal history of nicotine dependence: Secondary | ICD-10-CM

## 2015-12-07 DIAGNOSIS — Z992 Dependence on renal dialysis: Secondary | ICD-10-CM

## 2015-12-07 DIAGNOSIS — I25118 Atherosclerotic heart disease of native coronary artery with other forms of angina pectoris: Secondary | ICD-10-CM

## 2015-12-07 DIAGNOSIS — D693 Immune thrombocytopenic purpura: Secondary | ICD-10-CM

## 2015-12-07 LAB — POCT GLYCOSYLATED HEMOGLOBIN (HGB A1C): HEMOGLOBIN A1C: 6.7

## 2015-12-07 LAB — GLUCOSE, CAPILLARY: GLUCOSE-CAPILLARY: 317 mg/dL — AB (ref 65–99)

## 2015-12-07 LAB — POCT INR: INR: 1.3

## 2015-12-07 MED ORDER — WARFARIN SODIUM 2.5 MG PO TABS: ORAL_TABLET | ORAL | 2 refills | Status: DC

## 2015-12-07 NOTE — Assessment & Plan Note (Signed)
Assessment  He is tolerating his dialysis much better now that they've figured out his dry weight. He denies any nausea and vomiting and states he's feels as well as he has in a very long time.  Plan  We will continue with the 3 times a week dialysis to control his uremic symptoms which were the precipitant of starting dialysis in the first place.

## 2015-12-07 NOTE — Assessment & Plan Note (Signed)
Assessment  His seasonal allergic rhinitis is acting up at this time. He has not been consistently taking his nasal steroid therapy.  Plan  I recommended that he consistently take his nasal steroid therapy while his symptoms are acting up. If this is ineffective he can also take an over-the-counter antihistamine. We will reassess the success of controlling his symptoms at the follow-up visit.

## 2015-12-07 NOTE — Patient Instructions (Signed)
It was great to see you again!  I am glad you are feeling well.  1) Keep taking the medications as you are.  2) Start check your blood sugars and bring the book into the next appointment.  3) We gave you the flu shot today.  I will see you in 3 months, sooner if necessary.

## 2015-12-07 NOTE — Assessment & Plan Note (Signed)
Assessment  On exam today his heart rate was regular suggesting he was in sinus rhythm. He denies any palpitations and is tolerating the amiodarone and warfarin well.  Plan  We will continue with the amiodarone and warfarin for anticoagulation. We will reassess for symptoms suggestive of paroxysmal atrial flutter at the follow-up visit.

## 2015-12-07 NOTE — Telephone Encounter (Signed)
Received email into my [email or web address] account at 5:08 PM 15-SEP-17 reporting patient seen today by Dr. Josem KaufmannKlima with INR = 1.3 on 23.75mg  warfarin/wk. I called patient's wife. No missed doses she states. Will increase to 28.75mg  warfarin/wk (20% increase) as per protocol. Will send refill authorization for increased number of tablets in bottle to accommodate this dose increase. Will see patient in 10 days to perform INR on new regimen.

## 2015-12-07 NOTE — Assessment & Plan Note (Signed)
Assessment  He denies any further episodes of chest pain since the emergency department visit which was not felt to be cardiac in nature. A recent cardiac catheter end of June showed triple-vessel disease that was worse in the right coronary artery and he was felt to be a poor percutaneous coronary intervention candidate. The plan was to continue with medical therapy. Fortunately, during the cardiac catheterization, his left ventricular ejection fraction was noted to be hyperdynamic and this has normalized from earlier in the spring.  Plan  We will continue the carvedilol 6.25 mg twice daily holding the morning dose on the days of dialysis. We will reassess for symptoms of angina at the follow-up visit.

## 2015-12-07 NOTE — Progress Notes (Signed)
   Subjective:    Patient ID: [name] [name], male    DOB: 01/19/1946, 70 y.o.   MRN: [id number]  HPI  [name] [name] is here for follow-up of his diabetes, coronary artery disease, and chronic kidney disease. Please see the A&P for the status of the pt's chronic medical problems.  Review of Systems  Constitutional: Negative for activity change, appetite change, diaphoresis and unexpected weight change.  HENT: Positive for congestion, postnasal drip and rhinorrhea.   Respiratory: Negative for chest tightness and shortness of breath.   Cardiovascular: Negative for chest pain, palpitations and leg swelling.  Gastrointestinal: Negative for abdominal distention, abdominal pain, diarrhea, nausea and vomiting.  Musculoskeletal: Negative for arthralgias, joint swelling and myalgias.  Skin: Positive for color change. Negative for rash and wound.  Neurological: Negative for dizziness and light-headedness.      Objective:   Physical Exam  Constitutional: He is oriented to person, place, and time. He appears well-developed and well-nourished. No distress.  HENT:  Head: Normocephalic and atraumatic.  Eyes: Conjunctivae are normal. Right eye exhibits no discharge. Left eye exhibits no discharge. No scleral icterus.  Neurological: He is alert and oriented to person, place, and time.  Skin: Skin is warm and dry. No rash noted. He is not diaphoretic. No erythema.  Freckles on the palms that are new and of unclear etiology  Psychiatric: He has a normal mood and affect. His behavior is normal. Judgment and thought content normal.  Nursing note and vitals reviewed.     Assessment & Plan:   Please see problem oriented charting.

## 2015-12-07 NOTE — Assessment & Plan Note (Signed)
He received the flu vaccination today. Other than the Zostavax he is up-to-date on his preventative health care maintenance.

## 2015-12-07 NOTE — Assessment & Plan Note (Signed)
Assessment  His blood pressure is well controlled today at 126/64. This is on carvedilol 6.25 mg by mouth twice daily.  Plan  We will continue the carvedilol at 6.25 mg by mouth twice daily except on hemodialysis days where he is holding the morning dose. He continues to receive dialysis 3 times a week and they are adjusting his dry weight which is also helping control his hypertension. We will reassess his blood pressure at the follow-up visit

## 2015-12-07 NOTE — Assessment & Plan Note (Signed)
Assessment  [name] [name]'s diabetes is well controlled today. His blood sugar this morning was 122 upon awakening. Interestingly, after an egg McMuffin it was in the 300s. His hemoglobin A1c was 6.7 although because he is a dialysis patient this may be somewhat misleading. He is currently on no antidiabetic therapy. He has no peripheral circulatory complaints.  Plan  I asked [name] [name] to occasionally check fasting blood sugars throughout the day and record them in a diabetic sugar log which was provided for him. We will review these measurements at the follow-up to assure his glycemic control is decent. He is scheduled to see his ophthalmologist in the very near future. He is otherwise up-to-date on his diabetic health care maintenance.

## 2015-12-07 NOTE — Assessment & Plan Note (Signed)
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  Plan  He is to [facility]. He should complete his course within the next month and then be reassessed for a sustained response thereafter.

## 2015-12-07 NOTE — Assessment & Plan Note (Signed)
Assessment  He denies any symptoms consistent with lower extremity peripheral vascular disease such as claudication that would suggest symptomatic aortic atherosclerosis.  Plan  We will continue with aggressive risk factor modification for peripheral vascular occlusive disease by aggressively treating his diabetes and his essential hypertension. We will reassess for symptoms consistent with symptomatic atherosclerotic disease at the follow-up visit.

## 2015-12-10 ENCOUNTER — Ambulatory Visit: Payer: Medicare Other | Admitting: Internal Medicine

## 2015-12-12 LAB — HM DIABETES EYE EXAM

## 2015-12-17 ENCOUNTER — Other Ambulatory Visit: Payer: Self-pay | Admitting: *Deleted

## 2015-12-17 ENCOUNTER — Ambulatory Visit (INDEPENDENT_AMBULATORY_CARE_PROVIDER_SITE_OTHER): Payer: Medicare Other | Admitting: Pharmacist

## 2015-12-17 DIAGNOSIS — J302 Other seasonal allergic rhinitis: Secondary | ICD-10-CM

## 2015-12-17 DIAGNOSIS — Z7901 Long term (current) use of anticoagulants: Secondary | ICD-10-CM | POA: Diagnosis not present

## 2015-12-17 DIAGNOSIS — I4891 Unspecified atrial fibrillation: Secondary | ICD-10-CM | POA: Diagnosis not present

## 2015-12-17 LAB — POCT INR: INR: 2.1

## 2015-12-17 NOTE — Progress Notes (Signed)
Anti-Coagulation Progress Note  Derrek GuWilliam R Denz is a 70 y.o. male who is currently on an anti-coagulation regimen.    RECENT RESULTS: Recent results are below, the most recent result is correlated with a dose of 28.75 mg. per week: Lab Results  Component Value Date   INR 1.3 12/07/2015   INR 1.89 11/19/2015   INR 1.80 11/05/2015    ANTI-COAG DOSE:    ANTICOAG SUMMARY: Anticoagulation Episode Summary    Current INR goal:   2.0-3.0  TTR:   45.6 % (5.3 mo)  Next INR check:   11/19/2015  INR from last check:   1.80! (11/05/2015)  Most recent INR:    1.3! (12/07/2015)  Weekly max dose:     Target end date:     INR check location:     Preferred lab:     Send INR reminders to:        Comments:           ANTICOAG TODAY: Anticoagulation Summary  As of 12/17/2015   INR goal:   2.0-3.0  TTR:   45.6 % (5.3 mo)  Today's INR:     Next INR check:     Target end date:         Anticoagulation Episode Summary    INR check location:      Preferred lab:      Send INR reminders to:      Comments:         PATIENT INSTRUCTIONS: There are no Patient Instructions on file for this visit.   FOLLOW-UP Return in 3 weeks (on 01/07/2016) for Follow up INR at 1030h.  Hulen LusterJames Groce, III Pharm.D., CACP

## 2015-12-17 NOTE — Patient Instructions (Signed)
Patient instructed to take medications as defined in the Anti-coagulation Track section of this encounter.  Patient instructed to take today's dose.  Patient verbalized understanding of these instructions.    

## 2015-12-18 MED ORDER — FLUTICASONE PROPIONATE 50 MCG/ACT NA SUSP: 1.0000 | Freq: Every day | NASAL | 3 refills | Status: DC | PRN

## 2015-12-21 ENCOUNTER — Telehealth: Payer: Self-pay | Admitting: *Deleted

## 2015-12-21 NOTE — Telephone Encounter (Signed)
-----   Message from Shari ProwsAnnette W Tobin sent at 12/21/2015 10:29 AM EDT ----- Regarding: Triage Question Contact: [phone] Joyce GrossKay, The patient's wife [name] called to ask if the pt has an appt. He doesn't and Juliette AlcideMelinda doesn't have a referral packet from the dialysis center. She states his arm near the AVF is swollen and blistered and wonders if he needs to be seen. Can you call and assess? Thanks, Drinda ButtsAnnette

## 2015-12-21 NOTE — Telephone Encounter (Signed)
[facility] and she will check site tomorrow at his treatment. She stated that yesterday he had a "blister" from the tape pulling his skin but incision site was fine. They will call us if needed.

## 2015-12-25 ENCOUNTER — Encounter: Payer: Self-pay | Admitting: Vascular Surgery

## 2015-12-31 ENCOUNTER — Ambulatory Visit (INDEPENDENT_AMBULATORY_CARE_PROVIDER_SITE_OTHER): Payer: Medicare Other | Admitting: Internal Medicine

## 2015-12-31 ENCOUNTER — Ambulatory Visit: Payer: Medicare Other | Admitting: Vascular Surgery

## 2015-12-31 ENCOUNTER — Encounter: Payer: Self-pay | Admitting: Internal Medicine

## 2015-12-31 ENCOUNTER — Encounter: Payer: Self-pay | Admitting: Vascular Surgery

## 2015-12-31 ENCOUNTER — Ambulatory Visit (INDEPENDENT_AMBULATORY_CARE_PROVIDER_SITE_OTHER): Payer: Medicare Other | Admitting: Vascular Surgery

## 2015-12-31 VITALS — BP 120/67 | HR 54 | Temp 99.1°F | Resp 16 | Ht 68.0 in | Wt 168.0 lb

## 2015-12-31 VITALS — BP 143/80 | HR 54 | Temp 97.8°F

## 2015-12-31 DIAGNOSIS — N186 End stage renal disease: Secondary | ICD-10-CM

## 2015-12-31 DIAGNOSIS — Z992 Dependence on renal dialysis: Secondary | ICD-10-CM | POA: Diagnosis not present

## 2015-12-31 DIAGNOSIS — K7400 Hepatic fibrosis, unspecified: Secondary | ICD-10-CM

## 2015-12-31 DIAGNOSIS — B182 Chronic viral hepatitis C: Secondary | ICD-10-CM | POA: Diagnosis not present

## 2015-12-31 NOTE — Progress Notes (Signed)
    Postoperative Access Visit   History of Present Illness  Derrek GuWilliam R Mcgreal is a 70 y.o. year old male who presents for postoperative follow-up for: R BC AVF (Date: 10/08/15).  The patient's wounds are healed.  The patient notes no steal symptoms.  The patient is able to complete their activities of daily living.  The patient's current symptoms are: swelling in R arm.  Pt has acute onset of swelling of R arm after infilitration.  Reportedly, as the echymosis and hematoma in the R upper arm resolved, the swelling in R arm resolved.  For VQI Use Only  PRE-ADM LIVING: Home  AMB STATUS: Wheelchair  Physical Examination Vitals:   12/31/15 1548  BP: 120/67  Pulse: (!) 54  Resp: 16  Temp: 99.1 F (37.3 C)    RUE: Incision is healed, skin feels warm, hand grip is 5/5, sensation in digits is intact, palpable thrill, bruit can be auscultated, edema 1+  Medical Decision Making  Derrek GuWilliam R Wengert is a 70 y.o. year old male who presents s/p R BC AVF.   The pt might have an element of central stenosis, but this would not have resolved with time, suggesting that the patient had a hemodynamically significant compression of the fistula from the infilitration.  At this point, I would resume use of the fistula and [facility].  If he has residual swelling, I would consider at that time a R arm fistulogram, possible intervention.  Thank you for allowing us to participate in this patient's care.  Leonides SakeBrian Vivika Poythress, MD, FACS [facility] Office: [phone] Pager: [phone]

## 2015-12-31 NOTE — Assessment & Plan Note (Signed)
Doing well.  EOT lab in 1 month and SVR 24 in 6 months.  Will follow up with PharmD in 6 months after lab for test of cure.

## 2015-12-31 NOTE — Progress Notes (Signed)
   Subjective:    Patient ID: Derrek GuWilliam R Muench, male    DOB: 11/05/1945, 70 y.o.   MRN: [id number]  HPI Here for follow up of HCV.  Has a history of ESRD on dialysis and started on Zepatier in July.  Now about done with only about 3 days left.  No big issues including no fatigue, no headache.  Has not missed any doses.  Early viral load was undetectable.  Elastography with F2/3, no liver abnormalities noted. Hep B surface Ab positive, has received hepatitis A vaccine, pneumonia vaccine.    Review of Systems  Constitutional: Negative for fatigue.  Gastrointestinal: Negative for nausea.  Skin: Negative for rash.  Neurological: Negative for headaches.       Objective:   Physical Exam  Constitutional: He appears well-developed and well-nourished.  Eyes: No scleral icterus.  Cardiovascular: Normal rate, regular rhythm and normal heart sounds.   Skin: No rash noted.    SHx: does not drink alcohol      Assessment & Plan:

## 2015-12-31 NOTE — Assessment & Plan Note (Signed)
On renal-friendly regimen for hep C.

## 2015-12-31 NOTE — Assessment & Plan Note (Signed)
F2/3 on elastography and no signs of cirrhosis on ultrasound.  He does have low platelets and low albumin but in the setting of ESRD it is difficult to distinguish if it is related to advanced liver disease or not.  With his normal liver ultrasound, moderate fibrosis, I do not feel it is indicated to continue HCC screening every 6 months.

## 2016-01-07 ENCOUNTER — Ambulatory Visit (INDEPENDENT_AMBULATORY_CARE_PROVIDER_SITE_OTHER): Payer: Medicare Other | Admitting: Pharmacist

## 2016-01-07 DIAGNOSIS — Z7901 Long term (current) use of anticoagulants: Secondary | ICD-10-CM | POA: Diagnosis not present

## 2016-01-07 LAB — POCT INR: INR: 3.1

## 2016-01-07 NOTE — Progress Notes (Signed)
Anti-Coagulation Progress Note  [name] [name] is a 70 y.o. male who is currently on an anti-coagulation regimen.    RECENT RESULTS: Recent results are below, the most recent result is correlated with a dose of 30 mg. per week: Lab Results  Component Value Date   INR 3.10 01/07/2016   INR 2.10 12/17/2015   INR 1.3 12/07/2015    ANTI-COAG DOSE: Anticoagulation Dose Instructions as of 01/07/2016      Glynis SmilesSun Mon Tue Wed Thu Fri Sat   New Dose 3.75 mg 3.75 mg 3.75 mg 5 mg 3.75 mg 5 mg 3.75 mg       ANTICOAG SUMMARY: Anticoagulation Episode Summary    Current INR goal:   2.0-3.0  TTR:   48.7 % (6.3 mo)  Next INR check:   02/04/2016  INR from last check:   3.10! (01/07/2016)  Weekly max dose:     Target end date:     INR check location:     Preferred lab:     Send INR reminders to:        Comments:           ANTICOAG TODAY: Anticoagulation Summary  As of 01/07/2016   INR goal:   2.0-3.0  TTR:     Today's INR:   3.10!  Next INR check:   02/04/2016  Target end date:         Anticoagulation Episode Summary    INR check location:      Preferred lab:      Send INR reminders to:      Comments:         PATIENT INSTRUCTIONS: There are no Patient Instructions on file for this visit.   FOLLOW-UP Return in 4 weeks (on 02/04/2016) for Follow up INR at 1030h.  Hulen LusterJames Groce, III Pharm.D., CACP

## 2016-01-07 NOTE — Patient Instructions (Signed)
Patient instructed to take medications as defined in the Anti-coagulation Track section of this encounter.  Patient instructed to take today's dose.  Patient verbalized understanding of these instructions.    

## 2016-01-20 NOTE — Progress Notes (Signed)
Indication: Paroxysmal atrial flutter.  Duration: Indefinite.  INR above target.  Agree with Dr. Saralyn PilarGroce's assessment and plan as documented.

## 2016-02-04 ENCOUNTER — Ambulatory Visit (INDEPENDENT_AMBULATORY_CARE_PROVIDER_SITE_OTHER): Payer: Medicare Other | Admitting: Pharmacist

## 2016-02-04 DIAGNOSIS — Z7901 Long term (current) use of anticoagulants: Secondary | ICD-10-CM | POA: Diagnosis not present

## 2016-02-04 DIAGNOSIS — I4892 Unspecified atrial flutter: Secondary | ICD-10-CM

## 2016-02-04 LAB — POCT INR: INR: 3

## 2016-02-04 NOTE — Patient Instructions (Signed)
Patient educated about medication as defined in this encounter and verbalized understanding by repeating back instructions provided.   

## 2016-02-04 NOTE — Progress Notes (Signed)
Indication: Paroxysmal atrial flutter. Duration: Indefinite. INR: At target. Agree with Dr. Elmyra RicksKim's assessment and plan.

## 2016-02-04 NOTE — Progress Notes (Signed)
Anticoagulation Management [name] [name] is a 70 y.o. male who reports to the clinic for monitoring of warfarin treatment.    Indication: atrial flutter, CHADSVASC score of 4, HAS-BLED 3 Duration: indefinite  Anticoagulation Clinic Visit History: Patient does not report signs/symptoms of bleeding or thromboembolism  Anticoagulation Episode Summary    Current INR goal:   2.0-3.0  TTR:   42.5 % (7.2 mo)  Next INR check:   02/25/2016  INR from last check:   3.0 (02/04/2016)  Weekly max dose:     Target end date:     INR check location:     Preferred lab:     Send INR reminders to:        Comments:          ASSESSMENT Recent Results: The most recent result is correlated with 28.75 mg per week: Lab Results  Component Value Date   INR 3.0 02/04/2016   INR 3.10 01/07/2016   INR 2.10 12/17/2015   Anticoagulation Dosing: INR as of 02/04/2016 and Previous Dosing Information    INR Dt [facility] Goal [facility] Tot Sun Mon Tue Wed Thu Fri Sat   02/04/2016 3.0 2.0-3.0 28.75 mg 3.75 mg 3.75 mg 3.75 mg 5 mg 3.75 mg 5 mg 3.75 mg    Anticoagulation Dose Instructions as of 02/04/2016      Total Sun Mon Tue Wed Thu Fri Sat   New Dose 28.75 mg 3.75 mg 3.75 mg 3.75 mg 5 mg 3.75 mg 5 mg 3.75 mg     (2.5 mg x 1.5)  (2.5 mg x 1.5)  (2.5 mg x 1.5)  (2.5 mg x 2)  (2.5 mg x 1.5)  (2.5 mg x 2)  (2.5 mg x 1.5)                           INR today: Therapeutic  PLAN Weekly dose was unchanged  Patient Instructions  Patient educated about medication as defined in this encounter and verbalized understanding by repeating back instructions provided.   Patient advised to contact clinic or seek medical attention if signs/symptoms of bleeding or thromboembolism occur.  Patient verbalized understanding by repeating back information and was advised to contact me if further medication-related questions arise. Patient was also provided an information handout.  Follow-up Return in about 3 weeks (around  02/25/2016) for Follow up INR 02/25/2016 around 11am.  [name] [name] J  15 minutes spent face-to-face with the patient during the encounter. 50% of time spent on education. 50% of time was spent on assessment and plan.

## 2016-02-08 ENCOUNTER — Encounter: Payer: Self-pay | Admitting: *Deleted

## 2016-02-29 ENCOUNTER — Ambulatory Visit: Payer: Medicare Other | Admitting: Internal Medicine

## 2016-03-03 ENCOUNTER — Other Ambulatory Visit: Payer: Self-pay | Admitting: Internal Medicine

## 2016-03-03 ENCOUNTER — Ambulatory Visit (INDEPENDENT_AMBULATORY_CARE_PROVIDER_SITE_OTHER): Payer: Medicare Other | Admitting: Pharmacist

## 2016-03-03 ENCOUNTER — Ambulatory Visit (INDEPENDENT_AMBULATORY_CARE_PROVIDER_SITE_OTHER): Payer: Medicare Other | Admitting: Internal Medicine

## 2016-03-03 ENCOUNTER — Encounter: Payer: Self-pay | Admitting: Internal Medicine

## 2016-03-03 VITALS — BP 119/69 | HR 62 | Temp 98.2°F | Wt 170.3 lb

## 2016-03-03 DIAGNOSIS — I10 Essential (primary) hypertension: Secondary | ICD-10-CM

## 2016-03-03 DIAGNOSIS — I25118 Atherosclerotic heart disease of native coronary artery with other forms of angina pectoris: Secondary | ICD-10-CM

## 2016-03-03 DIAGNOSIS — I4892 Unspecified atrial flutter: Secondary | ICD-10-CM

## 2016-03-03 DIAGNOSIS — E113493 Type 2 diabetes mellitus with severe nonproliferative diabetic retinopathy without macular edema, bilateral: Secondary | ICD-10-CM

## 2016-03-03 DIAGNOSIS — E1122 Type 2 diabetes mellitus with diabetic chronic kidney disease: Secondary | ICD-10-CM

## 2016-03-03 DIAGNOSIS — Z89511 Acquired absence of right leg below knee: Secondary | ICD-10-CM

## 2016-03-03 DIAGNOSIS — Z79899 Other long term (current) drug therapy: Secondary | ICD-10-CM

## 2016-03-03 DIAGNOSIS — Z7901 Long term (current) use of anticoagulants: Secondary | ICD-10-CM | POA: Diagnosis not present

## 2016-03-03 DIAGNOSIS — E1165 Type 2 diabetes mellitus with hyperglycemia: Secondary | ICD-10-CM | POA: Diagnosis not present

## 2016-03-03 DIAGNOSIS — N186 End stage renal disease: Secondary | ICD-10-CM

## 2016-03-03 DIAGNOSIS — Z992 Dependence on renal dialysis: Secondary | ICD-10-CM

## 2016-03-03 DIAGNOSIS — J302 Other seasonal allergic rhinitis: Secondary | ICD-10-CM

## 2016-03-03 DIAGNOSIS — I700 Atherosclerosis of aorta: Secondary | ICD-10-CM

## 2016-03-03 DIAGNOSIS — I480 Paroxysmal atrial fibrillation: Secondary | ICD-10-CM

## 2016-03-03 DIAGNOSIS — Z0000 Encounter for general adult medical examination without abnormal findings: Secondary | ICD-10-CM

## 2016-03-03 DIAGNOSIS — I120 Hypertensive chronic kidney disease with stage 5 chronic kidney disease or end stage renal disease: Secondary | ICD-10-CM | POA: Diagnosis not present

## 2016-03-03 DIAGNOSIS — Z87891 Personal history of nicotine dependence: Secondary | ICD-10-CM

## 2016-03-03 HISTORY — DX: Type 2 diabetes mellitus with severe nonproliferative diabetic retinopathy without macular edema, bilateral: E11.3493

## 2016-03-03 LAB — POCT GLYCOSYLATED HEMOGLOBIN (HGB A1C): Hemoglobin A1C: 7.7

## 2016-03-03 LAB — GLUCOSE, CAPILLARY: Glucose-Capillary: 220 mg/dL — ABNORMAL HIGH (ref 65–99)

## 2016-03-03 LAB — POCT INR: INR: 3.5

## 2016-03-03 MED ORDER — ONDANSETRON HCL 4 MG PO TABS: 4.0000 mg | ORAL_TABLET | Freq: Three times a day (TID) | ORAL | 11 refills | Status: AC | PRN

## 2016-03-03 NOTE — Assessment & Plan Note (Signed)
Assessment  His blood pressure today was excellent at 119/69. This is on carvedilol 6.25 mg by mouth twice daily. He also continues on 3 times a week dialysis which also likely helps control his blood pressure.  We will continue the carvedilol at 6.25 mg by mouth twice daily as well as the 3 times a week hemodialysis. We will reassess the control of his blood pressure at the follow-up visit.

## 2016-03-03 NOTE — Progress Notes (Signed)
   Subjective:    Patient ID: [name] [name], male    DOB: 09/19/1945, 70 y.o.   MRN: [id number]  HPI  [name] GuWilliam R Doerner is here for follow-up of his diabetes, hypertension, end stage renal disease on dialysis, and coronary artery disease. Please see the A&P for the status of the pt's chronic medical problems.  Review of Systems  Constitutional: Negative for activity change, appetite change, fatigue and unexpected weight change.  HENT: Positive for congestion, postnasal drip, rhinorrhea and sinus pressure.   Eyes: Positive for visual disturbance.       Blurry vision lately with higher blood sugars  Respiratory: Negative for chest tightness, shortness of breath and wheezing.   Cardiovascular: Negative for chest pain, palpitations and leg swelling.  Gastrointestinal: Positive for constipation and nausea. Negative for abdominal pain, diarrhea and vomiting.       Constipation and nausea are chronic and stable  Genitourinary: Positive for flank pain.       Earlier this week had a transient episode of right flank pain that has since resolved.  Musculoskeletal: Positive for arthralgias and gait problem. Negative for joint swelling and myalgias.       Is scheduled to get new right leg prosthetic next week  Skin: Negative for color change, rash and wound.  Neurological: Negative for dizziness, syncope and light-headedness.  Psychiatric/Behavioral: Negative for dysphoric mood and sleep disturbance. The patient is not nervous/anxious.       Objective:   Physical Exam  Constitutional: He is oriented to person, place, and time. He appears well-developed and well-nourished. No distress.  HENT:  Head: Normocephalic and atraumatic.  Eyes: Conjunctivae are normal. Right eye exhibits no discharge. Left eye exhibits no discharge. No scleral icterus.  Cardiovascular: Normal rate, regular rhythm and normal heart sounds.   Pulmonary/Chest: Effort normal and breath sounds normal. No respiratory distress.  He has no wheezes. He has no rales.  Abdominal: Soft. Bowel sounds are normal. He exhibits no distension. There is no tenderness. There is no rebound.  Musculoskeletal: Normal range of motion. He exhibits deformity. He exhibits no edema or tenderness.  s/p R BKA  Neurological: He is alert and oriented to person, place, and time. He exhibits normal muscle tone.  Skin: Skin is warm and dry. No rash noted. He is not diaphoretic. No erythema.  Small patch of dry tender skin to palpation in the mid section of the left buttock with a smaller kissing lesion on the right buttock.  No skin breakdown or overlying vesicles.  Psychiatric: He has a normal mood and affect. His behavior is normal. Judgment and thought content normal.  Nursing note and vitals reviewed.     Assessment & Plan:   Please see problem oriented charting.

## 2016-03-03 NOTE — Patient Instructions (Signed)
Patient instructed to take medications as defined in the Anti-coagulation Track section of this encounter.  Patient instructed to take today's dose.  Patient instructed to take 1&1/2 tablets of your 2.5mg  strength warfarin tablets by mouth daily.  Patient verbalized understanding of these instructions.

## 2016-03-03 NOTE — Assessment & Plan Note (Signed)
We will discuss colon cancer screening as well as a Zostavax at the follow-up visit.

## 2016-03-03 NOTE — Assessment & Plan Note (Signed)
Assessment  We are unable to assess for claudication given that he is not ambulatory because his right lower extremity prosthesis has not fit. He is due to get his new right lower extremity prosthesis next week. At that point he hopes to begin ambulation once again and we can reassess for evidence of symptomatic claudication from his aortic atherosclerotic disease.  Plan  We will continue aggressive risk factor modification of both his hypertension and diabetes as noted in this note. We will reassess for evidence of claudication at the follow-up visit once he is ambulatory once again.

## 2016-03-03 NOTE — Patient Instructions (Signed)
It was good to see you again.  1) Start checking your sugars every other day and record them.  We will review them at follow-up.  2) Keep taking your other medications as you are.  3) Hold yourself to 1 serving of fruit a day.  This should help your sugars.  4) Let's see how things are going after you get you new leg next week.  5) Will talk more about colon cancer screening next time.  I will see you in 3 months, sooner if necessary.

## 2016-03-03 NOTE — Progress Notes (Signed)
Anti-Coagulation Progress Note  [name] [name] is a 70 y.o. male who is currently on an anti-coagulation regimen.    RECENT RESULTS: Recent results are below, the most recent result is correlated with a dose of 28.75 mg. per week: Lab Results  Component Value Date   INR 3.50 03/03/2016   INR 3.0 02/04/2016   INR 3.10 01/07/2016    ANTI-COAG DOSE: Anticoagulation Dose Instructions as of 03/03/2016      Glynis SmilesSun Mon Tue Wed Thu Fri Sat   New Dose 3.75 mg 3.75 mg 3.75 mg 3.75 mg 3.75 mg 3.75 mg 3.75 mg    Description   Take 1&1/2 tablets of your 2.5mg  strength warfarin tablets by mouth daily.       ANTICOAG SUMMARY: Anticoagulation Episode Summary    Current INR goal:   2.0-3.0  TTR:   37.6 % (8.2 mo)  Next INR check:   03/31/2016  INR from last check:   3.50! (03/03/2016)  Weekly max dose:     Target end date:     INR check location:     Preferred lab:     Send INR reminders to:        Comments:           ANTICOAG TODAY: Anticoagulation Summary  As of 03/03/2016   INR goal:   2.0-3.0  TTR:     Today's INR:   3.50!  Next INR check:   03/31/2016  Target end date:         Anticoagulation Episode Summary    INR check location:      Preferred lab:      Send INR reminders to:      Comments:         PATIENT INSTRUCTIONS: Patient instructed to take medications as defined in the Anti-coagulation Track section of this encounter.  Patient instructed to take today's dose.  Patient instructed to take 1&1/2 tablets of your 2.5mg  strength warfarin tablets by mouth daily.  Patient verbalized understanding of these instructions.     FOLLOW-UP Return in 4 weeks (on 03/31/2016) for Follow up INR at 1015h.  [name] [name], III Pharm.D., CACP

## 2016-03-03 NOTE — Assessment & Plan Note (Signed)
Assessment  He has remained asymptomatic with regards to his paroxysmal atrial flutter. This is on carvedilol 6.25 mg by mouth twice daily and warfarin. The warfarin dose required adjustment today in the anticoagulation clinic and he is now taking 3.75 mg daily.  Plan  We will continue the carvedilol at 6.25 mg by mouth twice daily and warfarin at 3.75 mg daily with further monitoring of his INR in the anticoagulation clinic.

## 2016-03-03 NOTE — Assessment & Plan Note (Signed)
Assessment  Recent sugars have been pushing 200. Yesterday he had a sugar of 206 in the morning. His hemoglobin A1c today was 7.7. This is despite having chronic kidney disease requiring hemodialysis and likely increased turnover of his red blood cells. Thus, I believe the control of his diabetes is worse than suggested by this hemoglobin A1c. He has been on diet therapy alone and admits to significant dietary indiscretions with regards to multiple servings of fruit including clementines each day. We need to get a better feel for the degree of his hyperglycemia with more frequent glucose checks. In addition, control of his sugar intake via fruits needs to be improved. He is tolerating the hemodialysis sessions well.  Plan  He will check his sugars every other day until the next clinic visit and record them for review at that visit. He will also limit his fruit intake to one serving per day and pay better attention to his diet. We will reassess his glycemic control at the next visit with a repeat hemoglobin A1c, and more importantly with review of his sugar log. It is quite possible that he may require reinstitution of pharmacologic therapy for his diabetes if he remains hyperglycemic. We will continue the hemodialysis sessions now that he is tolerating them much better with the kidney center's experience managing his metabolic and volume status.

## 2016-03-03 NOTE — Assessment & Plan Note (Signed)
Assessment  His allergic rhinitis symptoms are reasonably well-controlled on the Flonase 1 spray daily as needed.  Plan  We will continue the fluticasone nasal spray 1 puff each nostril every 24 hours when necessary for allergic rhinitis. We will reassess the efficacy of this therapy in controlling his symptoms at the follow-up visit.

## 2016-03-03 NOTE — Assessment & Plan Note (Signed)
Assessment  He continues to deny chest pain consistent with angina while on the carvedilol 6.25 mg by mouth twice daily. We are also aggressively managing his other cardiovascular risk factors including his hyperlipidemia and diabetes.  Plan  We will continue aggressive risk factor modification as well as continue the carvedilol 6.25 mg by mouth twice daily. We will reassess for evidence of angina at the follow-up visit.

## 2016-03-12 ENCOUNTER — Encounter: Payer: Self-pay | Admitting: *Deleted

## 2016-03-19 ENCOUNTER — Other Ambulatory Visit: Payer: Self-pay | Admitting: Physician Assistant

## 2016-03-31 ENCOUNTER — Ambulatory Visit (INDEPENDENT_AMBULATORY_CARE_PROVIDER_SITE_OTHER): Payer: Medicare Other | Admitting: Pharmacist

## 2016-03-31 ENCOUNTER — Encounter (INDEPENDENT_AMBULATORY_CARE_PROVIDER_SITE_OTHER): Payer: Self-pay

## 2016-03-31 DIAGNOSIS — Z7901 Long term (current) use of anticoagulants: Secondary | ICD-10-CM | POA: Diagnosis not present

## 2016-03-31 DIAGNOSIS — I4891 Unspecified atrial fibrillation: Secondary | ICD-10-CM

## 2016-03-31 LAB — POCT INR: INR: 2.4

## 2016-03-31 NOTE — Patient Instructions (Signed)
Patient instructed to take medications as defined in the Anti-coagulation Track section of this encounter.  Patient instructed to take today's dose.  Patient instructed to take 1&1/2 of your 2.5mg  green warfarin tablets by mouth once-daily at [facility]6PM each day.  Patient verbalized understanding of these instructions.

## 2016-03-31 NOTE — Progress Notes (Signed)
Anti-Coagulation Progress Note  Derrek GuWilliam R Standley is a 71 y.o. male who is currently on an anti-coagulation regimen.    RECENT RESULTS: Recent results are below, the most recent result is correlated with a dose of 26.25 mg. per week: Lab Results  Component Value Date   INR 2.40 03/31/2016   INR 3.50 03/03/2016   INR 3.0 02/04/2016    ANTI-COAG DOSE: Anticoagulation Dose Instructions as of 03/31/2016      Glynis SmilesSun Mon Tue Wed Thu Fri Sat   New Dose 3.75 mg 3.75 mg 3.75 mg 3.75 mg 3.75 mg 3.75 mg 3.75 mg    Description   Take 1&1/2 tablets of your 2.5mg  strength warfarin tablets by mouth daily.       ANTICOAG SUMMARY: Anticoagulation Episode Summary    Current INR goal:   2.0-3.0  TTR:   39.4 % (9.1 mo)  Next INR check:   04/28/2016  INR from last check:   2.40 (03/31/2016)  Weekly max dose:     Target end date:     INR check location:     Preferred lab:     Send INR reminders to:        Comments:           ANTICOAG TODAY: Anticoagulation Summary  As of 03/31/2016   INR goal:   2.0-3.0  TTR:     Today's INR:   2.40  Next INR check:   04/28/2016  Target end date:         Anticoagulation Episode Summary    INR check location:      Preferred lab:      Send INR reminders to:      Comments:         PATIENT INSTRUCTIONS: Patient instructed to take medications as defined in the Anti-coagulation Track section of this encounter.  Patient instructed to take today's dose.  Patient instructed to take 1&1/2 of your 2.5mg  green warfarin tablets by mouth once-daily at [facility]6PM each day.  Patient verbalized understanding of these instructions.       FOLLOW-UP Return in 4 weeks (on 04/28/2016) for Follow up INR at 1045h.  Hulen LusterJames Brenan [place], III Pharm.D., CACP

## 2016-04-01 NOTE — Progress Notes (Signed)
INTERNAL MEDICINE TEACHING ATTENDING ADDENDUM - Gust RungErik C Keinan Brouillet, DO Duration- indefinate, Indication- A fib, INR-  Lab Results  Component Value Date   INR 2.40 03/31/2016  . Agree with pharmacy recommendations as outlined in their note.

## 2016-04-09 IMAGING — DX DG CHEST 2V
2 series · 2 of 2 positions shown · non-contrast
Comparison: 06/13/2014; 05/24/2013; 07/14/2006

CLINICAL DATA: Post hip surgery ([DATE]), now with fever.

EXAM:
CHEST  2 VIEW

[chest lat]
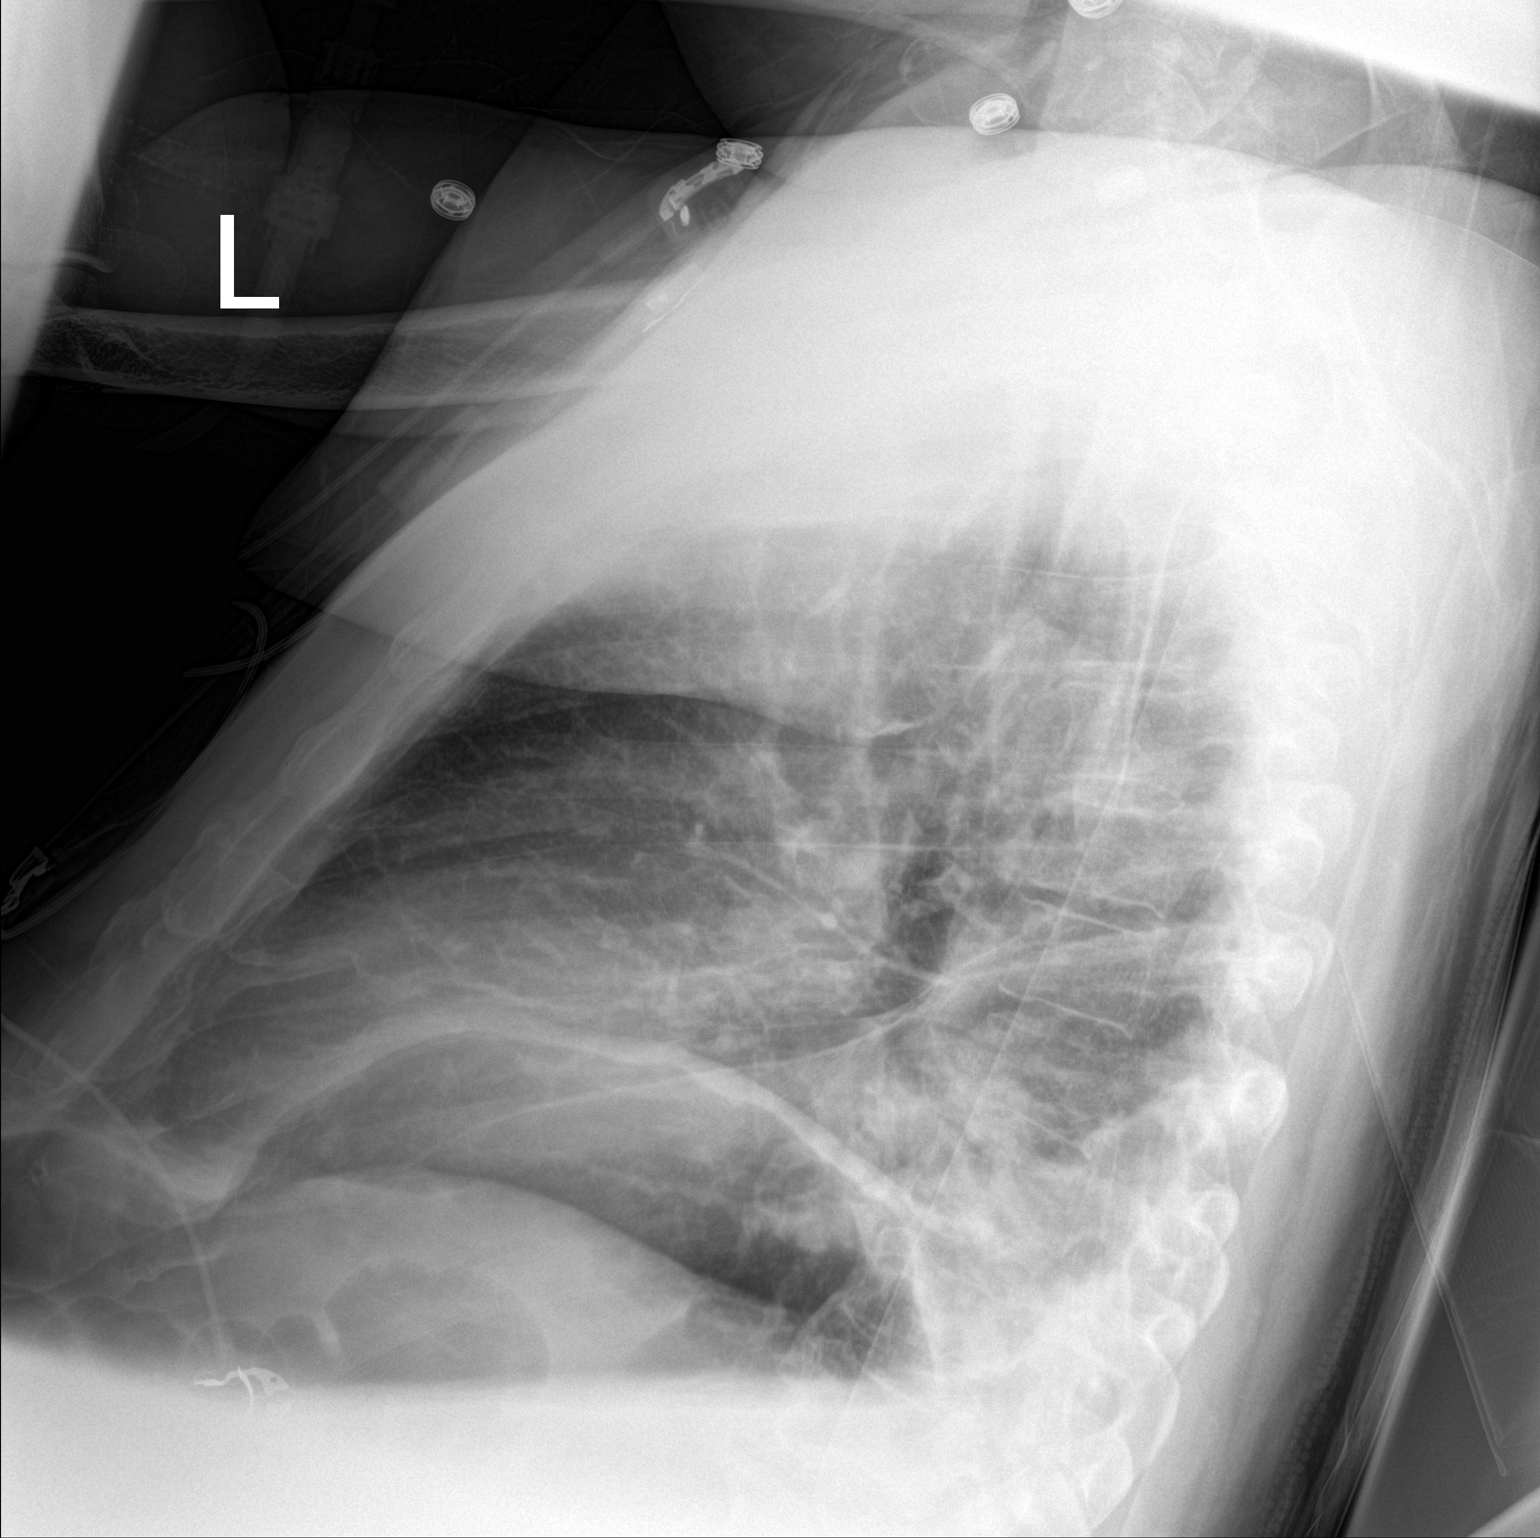

[chest ap]
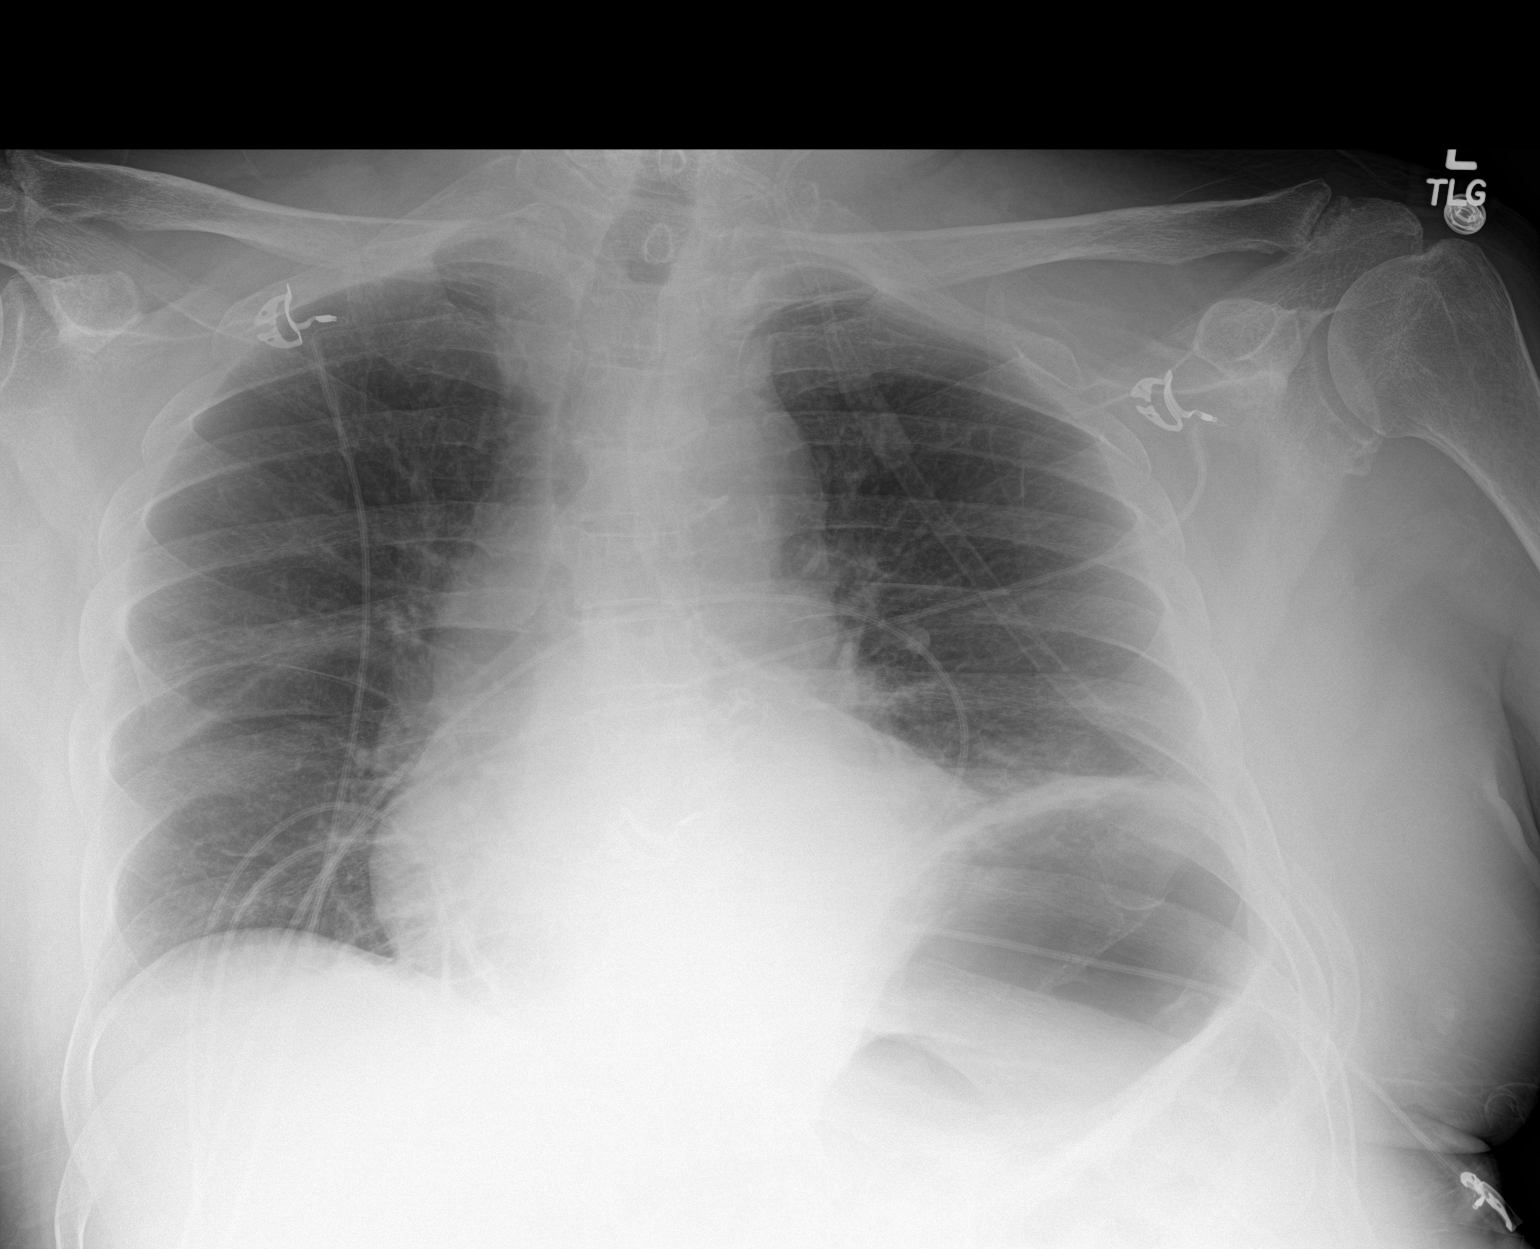

[2 of 2 positions shown; findings below may reference images not displayed]

FINDINGS: Grossly unchanged enlarged cardiac silhouette and mediastinal
contours given persistently reduced lung volumes. Evaluation the
retrosternal clear space obscured secondary to overlying soft
tissues. Worsening bibasilar opacities left greater than right.
Trace left-sided effusion is not excluded. No definite evidence of
edema. No pneumothorax. Unchanged bones. There is mild to moderate
gas distention of the stomach.
IMPRESSION: Decreased lung volumes with worsening bibasilar opacities, left
greater than right, atelectasis versus infiltrate. Continued
attention on follow-up is recommended.

## 2016-04-14 ENCOUNTER — Other Ambulatory Visit: Payer: Self-pay | Admitting: Internal Medicine

## 2016-04-14 DIAGNOSIS — I4892 Unspecified atrial flutter: Secondary | ICD-10-CM

## 2016-04-16 IMAGING — CT CT HIP*L* W/O CM
2 of 4 series · 16 of 46 positions shown, 18 images · non-contrast
Comparison: Pelvic CT 06/10/2010.  Radiographs 06/14/2014.

CLINICAL DATA: Left hip pain and limited range of motion following
left hip arthroplasty 3 days ago. History of diabetes.

EXAM:
CT OF THE LEFT HIP WITHOUT CONTRAST
TECHNIQUE: Multidetector CT imaging of the left hip was performed according to
the standard protocol. Multiplanar CT image reconstructions were
also generated.

[Series 3: pelvis 5.0 i31f 1 · axial · 0.47mm/px · z∈[+958,+1218]mm · 13 of 60 slices shown, 15 images]
[im 4/60  soft-tissue]
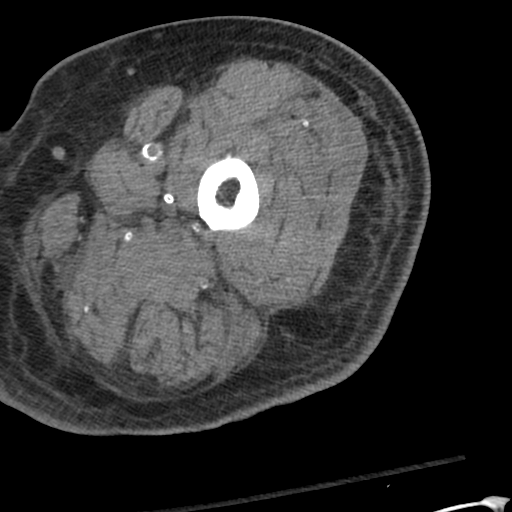
[im 4/60  bone]
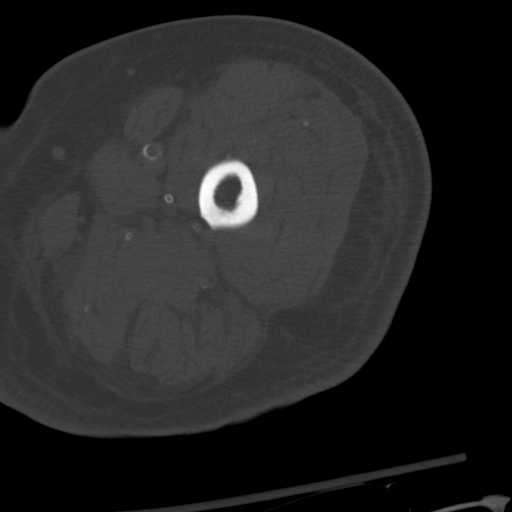
[im 8/60  soft-tissue]
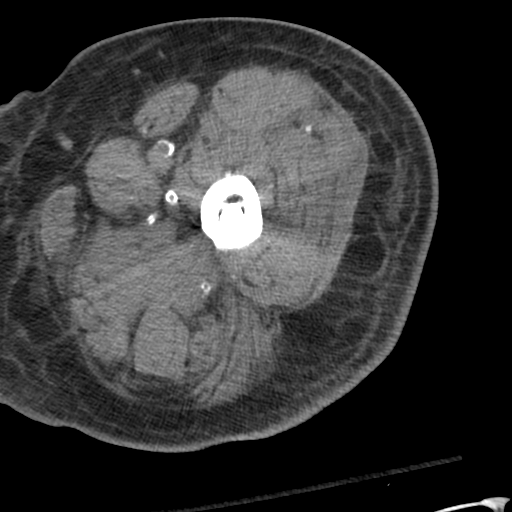
[im 12/60  soft-tissue]
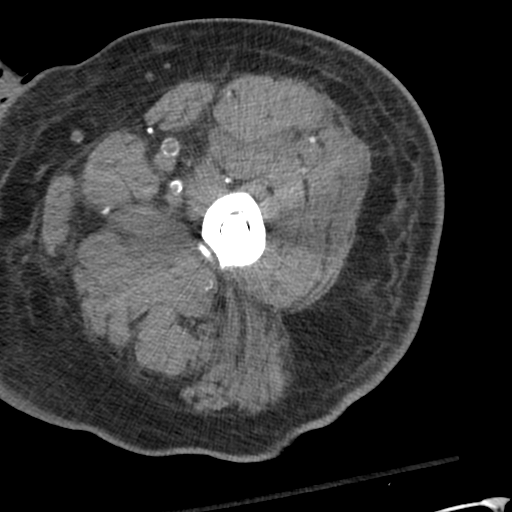
[im 18/60  soft-tissue]
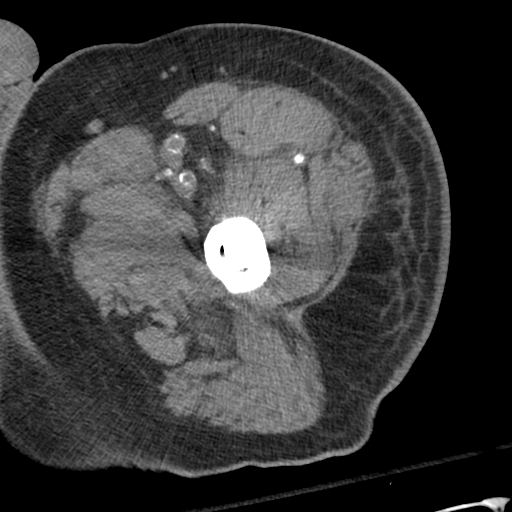
[im 21/60  soft-tissue]
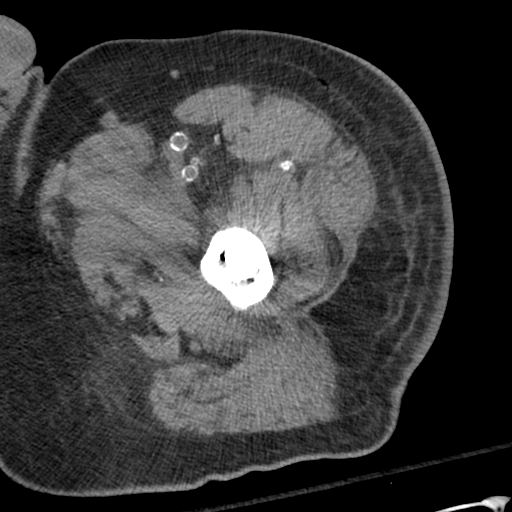
[im 25/60  soft-tissue]
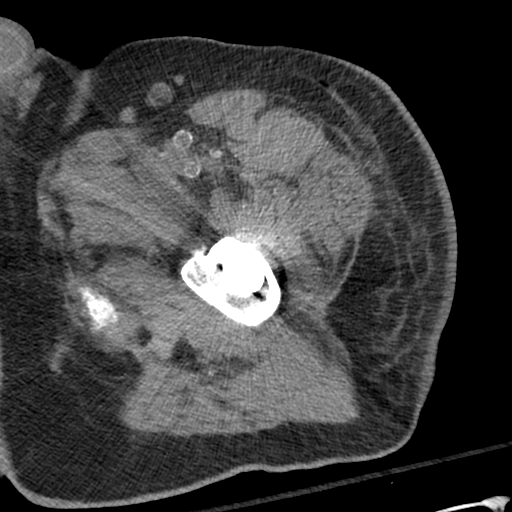
[im 31/60  soft-tissue]
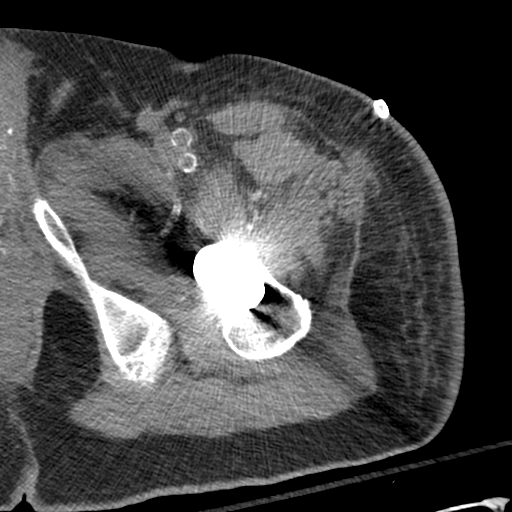
[im 35/60  soft-tissue]
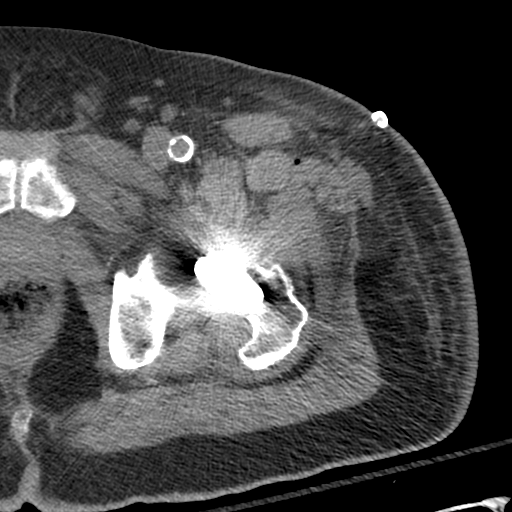
[im 39/60  soft-tissue]
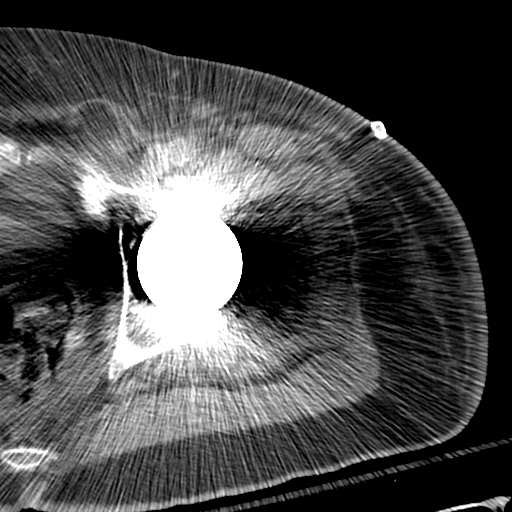
[im 39/60  bone]
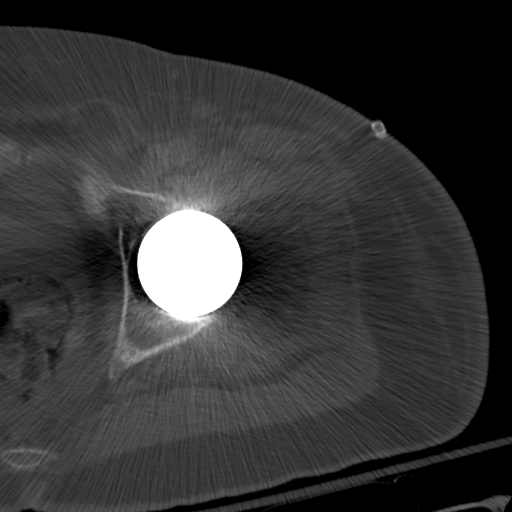
[im 42/60  soft-tissue]
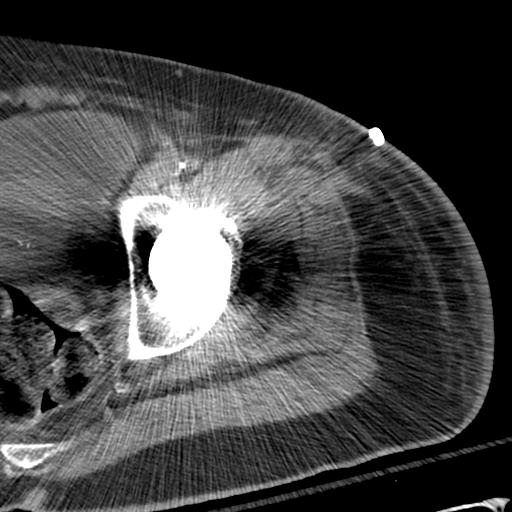
[im 48/60  soft-tissue]
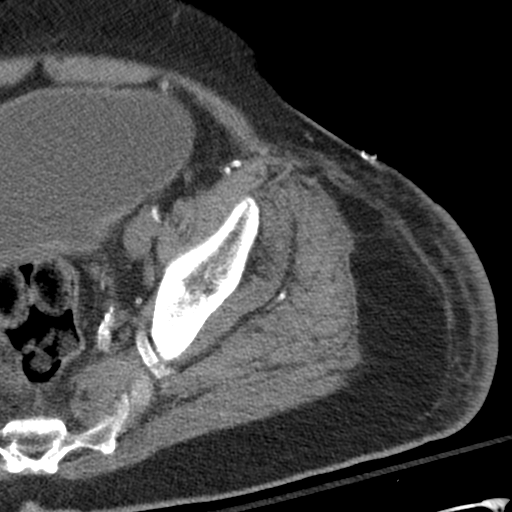
[im 52/60  soft-tissue]
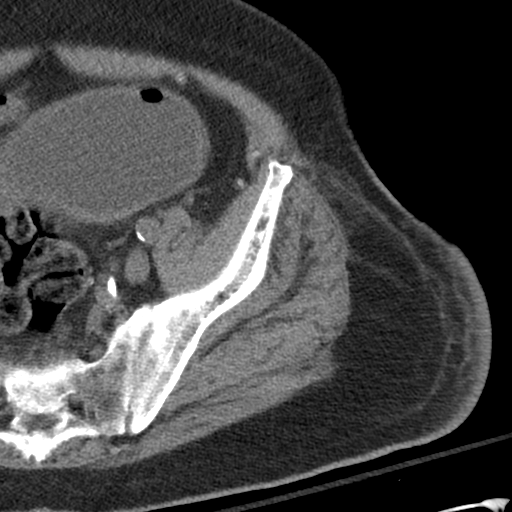
[im 56/60  soft-tissue]
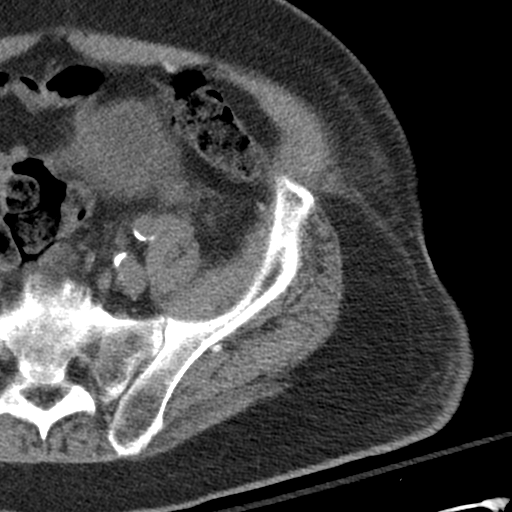

[Series 7: coronal · coronal · 0.43mm/px · 3 of 75 slices shown]
[im 25/75  soft-tissue]
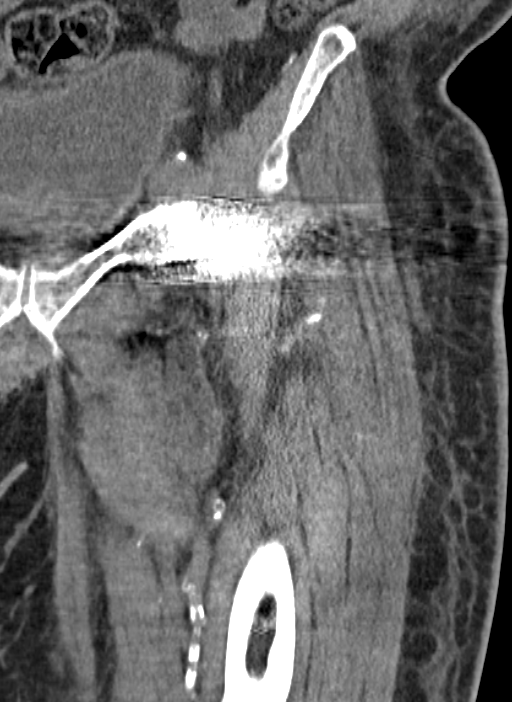
[im 33/75  soft-tissue]
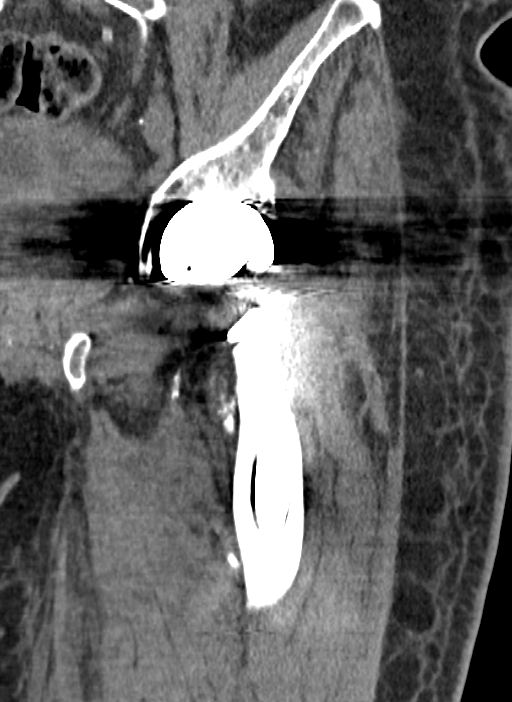
[im 42/75  soft-tissue]
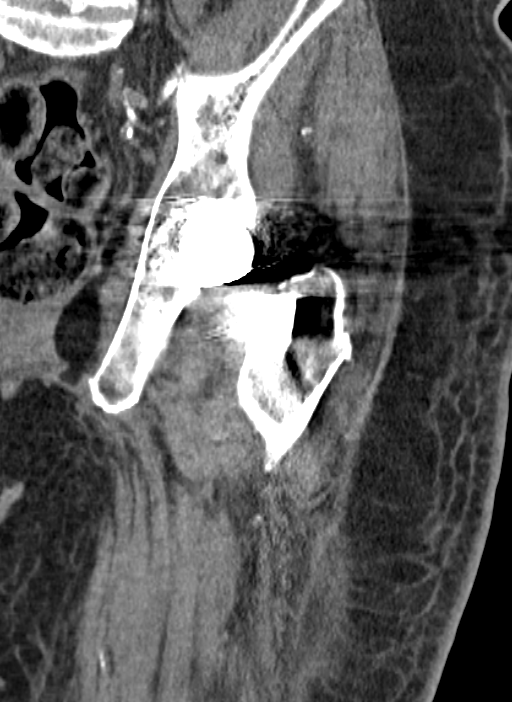

[16 of 46 positions shown; findings below may reference images not displayed]

FINDINGS: Examination is limited to the left hip and lower left hemipelvis.
The left hip hemiarthroplasty appears well positioned. There is no
evidence of acute fracture or dislocation. No large joint effusion
or periarticular hematoma demonstrated.

There are postsurgical changes within the muscles and subcutaneous
fat anterolaterally with small residual air bubbles. Diffuse
atherosclerosis of the femoral arteries noted.

A small amount of air is noted within the urinary bladder lumen,
probably related to recent instrumentation/ catheterization.
Degenerative disc disease at L5-S1 appears grossly unchanged.
IMPRESSION: 1. No demonstrated complication related to recent left hip
hemiarthroplasty.
2. Expected postsurgical changes in the surrounding muscles and
subcutaneous fat anterolaterally.
3. Diffuse atherosclerosis.

## 2016-04-25 NOTE — Progress Notes (Signed)
HPI:FU coronary disease, status post PCI of his right coronary artery with drug- eluting stent in February 2006 and atrial flutter.An abdominal ultrasound in August 2006 showed no aneurysm. H/O atrial flutter. Echo 5/16 showed EF 50 with basal inferior akinesis, mild LAE, mild RAE/RVE. Placed on amiodarone and converted. Patient had attempted atrial flutter ablation in November 2016 but was unsuccessful. Echocardiogram May 2017 showed ejection fraction 30-35% and grade 1 diastolic dysfunction. Nuclear study May 2017 showed ejection fraction 28%. There was a large inferior apical and inferior lateral wall infarct with no ischemia. Cardiac catheterization June 2017 showed a 65% followed by 99% right coronary artery. There was a 60% first marginal. LV function described as hyperdynamic. Medical therapy recommended. Carotid Dopplers August 2017 showed 1-39% bilateral stenosis. Since last seen, patient denies dyspnea, chest pain or syncope.  Current Outpatient Prescriptions  Medication Sig Dispense Refill  . amiodarone (PACERONE) 200 MG tablet Take 1 tablet (200 mg total) by mouth daily. 90 tablet 3  . calcium citrate-vitamin D (CITRACAL+D) 315-200 MG-UNIT per tablet Take 1 tablet by mouth daily.     . carvedilol (COREG) 6.25 MG tablet Take 1 tablet (6.25 mg total) by mouth 2 (two) times daily. 180 tablet 3  . famotidine (PEPCID) 20 MG tablet TAKE ONE TABLET BY MOUTH BEFORE BREAKFAST AND ONE TABLET BEFORE SUPPER 60 tablet 3  . fish oil-omega-3 fatty acids 1000 MG capsule Take 1 g by mouth daily.    . fluticasone (FLONASE) 50 MCG/ACT nasal spray Place 1 spray into both nostrils daily as needed for allergies. 16 g 3  . hydroxypropyl methylcellulose (ISOPTO TEARS) 2.5 % ophthalmic solution Place 2 drops into both eyes daily as needed for dry eyes. Reported on 04/20/2015    . levothyroxine (SYNTHROID, LEVOTHROID) 25 MCG tablet Take 1 tablet (25 mcg total) by mouth daily before breakfast. 90 tablet 3  .  Multiple Vitamins-Minerals (MULTIVITAMIN WITH MINERALS) tablet Take 1 tablet by mouth daily.    . ondansetron (ZOFRAN) 4 MG tablet Take 1 tablet (4 mg total) by mouth every 8 (eight) hours as needed for nausea. 30 tablet 11  . warfarin (COUMADIN) 2.5 MG tablet Take 1.5 tablets (3.75 mg total) by mouth daily at 6 PM. 45 tablet 2   No current facility-administered medications for this visit.      Past Medical History:  Diagnosis Date  . Arthritis    "fingers" (02/07/2015)  . Atrial flutter (HCC) 07/26/2014   a. May 2016, CHA2DS2VASc = 4 -> Eliquis, spontaneous conversion to NSR;  b. On amio;  c. 01/2015 EPS: Unable to induce right sided Aflutter. Non-sustained Afib and Left sided Aflutter noted.  . Benign prostatic hypertrophy with nocturia 07/07/2006  . Cardiomyopathy (HCC)    a. 07/2014 Echo: EF 50%; b. 03/2015 Ech: EF 20-25%; c. 07/2015 Echo: EF 30-35%, Gr1 DD, sev LVH.  [name] Chronic venous insufficiency 04/12/2010  . Closed displaced fracture of left femoral neck (HCC) 06/14/2014   s/p left hip hemiarthroplasty June 14, 2014   . Constipation 05/25/2009   Intermittent   . Coronary artery disease 04/02/2006   a. s/p RCA DES (2.5 x 24 mm TAXUS drug-eluting stent) 2006;  b. low risk myoview in 2011; c. 5/17 MV: EF 28%, large inf apical and inflat infarct w/o ischemia.  . Degenerative joint disease involving multiple joints 02/02/2007  . End stage renal disease on dialysis [facility])    a. TTS Dialysis - currently through right chest diatek, pending AVF.  [name]  GERD (gastroesophageal reflux disease)   . Hepatitis    Hep C  . History of blood transfusion 05/2014   "related to hip OR"  . Hyperlipidemia 04/02/2006  . Hypertension   . Hypertensive heart disease 12/09/2006  . Hypothyroidism   . Internal and external hemorrhoids without complication 08/20/2012  . Microcytic normochromic anemia 05/27/2006  . Obesity (BMI 30.0-34.9) 01/15/2012  . 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 Type 2 diabetes mellitus with peripheral artery disease (HCC) 05/27/2006   Absent pulses in the left foot   . 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 AV FISTULA PLACEMENT Left 10/08/2015   Procedure:  BRACHIOCEPHALIC ARTERIOVENOUS (AV) FISTULA CREATION Right Arm;  Surgeon: Fransisco Hertz, MD;  Location: [facility] OR;  Service: Vascular;  Laterality: Left;  . CARDIAC CATHETERIZATION N/A 09/21/2015   Procedure: Left Heart Cath and Coronary Angiography;  Surgeon: Marykay Lex, MD;  Location: [facility] INVASIVE CV LAB;  Service: Cardiovascular;  Laterality: N/A;  . CHOLECYSTECTOMY N/A 12/12/2014   Procedure: LAPAROSCOPIC CHOLECYSTECTOMY WITH INTRAOPERATIVE CHOLANGIOGRAM;  Surgeon: Gaynelle Adu, MD;  Location: [facility] OR;  Service: General;  Laterality: N/A;  . CORONARY ANGIOPLASTY WITH STENT PLACEMENT    . ELECTROPHYSIOLOGIC STUDY N/A 02/07/2015   Procedure: A-Flutter Ablation;  Surgeon: Marinus Maw, MD;  Location: [facility] INVASIVE CV LAB;  Service: Cardiovascular;  Laterality: N/A;  . FRACTURE SURGERY    . INSERTION OF DIALYSIS CATHETER Right 06/13/2015   Procedure: INSERTION OF DIALYSIS CATHETER RIGHT INTERNAL JUGULAR;  Surgeon: Larina Earthly, MD;  Location: [facility] OR;  Service: Vascular;  Laterality: Right;  . JOINT REPLACEMENT    . LEG AMPUTATION ABOVE KNEE Right ~ 2008  . PILONIDAL CYST EXCISION  1990's  . PROSTATE BIOPSY  ~ 2013  . REFRACTIVE SURGERY Bilateral   . REMOVAL OF A DIALYSIS CATHETER Right 06/13/2015   Procedure: REMOVAL OF A DIALYSIS  CATHETER;  Surgeon: Larina Earthly, MD;  Location: [facility] OR;  Service: Vascular;  Laterality: Right;  . TONSILLECTOMY    . TOTAL HIP ARTHROPLASTY Left 06/14/2014   Procedure: HEMI HIP ARTHROPLASTY ANTERIOR APPROACH;  Surgeon: Tarry Kos, MD;  Location: [facility] OR;  Service: Orthopedics;  Laterality: Left;    Social History   Social History  . Marital status: Married    Spouse name: N/A  . Number of children: N/A  . Years of education: N/A   Occupational History  . Not on file.   Social History Main Topics  . Smoking status: Former Smoker    Packs/day: 1.00    Years: 10.00    Types: Cigarettes    Quit date: 06/16/1968  . Smokeless tobacco: Never Used  . Alcohol use No     Comment: "quit alcohol in the 1960's"  . Drug use: No  . Sexual activity: Not Currently    Birth control/ protection: None   Other Topics Concern  . Not on file   Social History Narrative  . No narrative on file    Family History  Problem  Relation Age of Onset  . Hypertension Mother   . Heart attack Father   . Breast cancer Sister   . Arthritis Sister   . Heart attack Brother   . Diabetes Brother   . Stroke Brother   . Pneumonia Daughter   . Diabetes Brother   . Alcoholism Brother   . Diabetes Brother   . Arthritis Sister     Bilateral knee replacement  . HIV Daughter   . Hypertension Daughter   . Drug abuse Daughter   . Schizophrenia Daughter   . Hypertension Daughter   . Drug abuse Daughter   . Bipolar disorder Daughter     ROS: no fevers or chills, productive cough, hemoptysis, dysphasia, odynophagia, melena, hematochezia, dysuria, hematuria, rash, seizure activity, orthopnea, PND, pedal edema, claudication. Remaining systems are negative.  Physical Exam: Well-developed well-nourished in no acute distress.  Skin is warm and dry.  HEENT is normal.  Neck is supple.  Chest is clear to auscultation with normal expansion.  Cardiovascular exam is regular rate and rhythm. 2/6 systolic murmur  left sternal border Abdominal exam nontender or distended. No masses palpated. 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 Left axis deviation . Left ventricular hypertrophy. Right bundle branch block.  A/P  1 Coronary artery disease-we will not treat with aspirin as patient is on chronic Coumadin. Add statin.  2 history of atrial flutter-patient remains in sinus rhythm. Continue amiodarone. Check TSH, liver functions and chest x-ray. Continue Coumadin. Check hemoglobin.  3 hyperlipidemia-resume statin. Will treat with Lipitor 40 mg daily. Check lipids and liver in 4 weeks.  4 ischemic cardiomyopathy-continue carvedilol. Improved on most recent evaluation at time of catheterization.   5 end-stage renal disease-managed by nephrology.    Olga MillersBrian Dashawna Delbridge, MD

## 2016-04-28 ENCOUNTER — Ambulatory Visit (INDEPENDENT_AMBULATORY_CARE_PROVIDER_SITE_OTHER): Payer: Medicare Other | Admitting: Pharmacist

## 2016-04-28 DIAGNOSIS — I4892 Unspecified atrial flutter: Secondary | ICD-10-CM

## 2016-04-28 DIAGNOSIS — Z7901 Long term (current) use of anticoagulants: Secondary | ICD-10-CM

## 2016-04-28 LAB — POCT INR: INR: 1.6

## 2016-04-28 NOTE — Patient Instructions (Signed)
Patient educated about medication as defined in this encounter and verbalized understanding by repeating back instructions provided.   

## 2016-04-28 NOTE — Progress Notes (Signed)
Anticoagulation Management [name] [name] is a 71 y.o. male who reports to the clinic for monitoring of warfarin treatment.    Indication: atrial flutter CHA2DS2 Vasc Score 4, HAS-BLED 4 Duration: indefinite Supervising physician: Blanch MediaElizabeth Butcher  Anticoagulation Clinic Visit History: Patient does not report signs/symptoms of bleeding or thromboembolism. Of note, he states that although he takes warfarin each morning, he did not yet take today's dose due to awaiting INR results, and also he takes his dose prior to dialysis on dialysis days.   Anticoagulation Episode Summary    Current INR goal:   2.0-3.0  TTR:   40.4 % (10 mo)  Next INR check:   05/12/2016  INR from last check:   1.6! (04/28/2016)  Weekly max dose:     Target end date:     INR check location:     Preferred lab:     Send INR reminders to:        Comments:          ASSESSMENT Recent Results: The most recent result is correlated with 26.25 mg per week: Lab Results  Component Value Date   INR 1.6 04/28/2016   INR 2.40 03/31/2016   INR 3.50 03/03/2016   Anticoagulation Dosing: INR as of 04/28/2016 and Previous Dosing Information    INR Dt INR Goal Jacki ConesWkly Tot Sun Mon Tue Wed Thu Fri Sat   04/28/2016 1.6 2.0-3.0 26.25 mg 3.75 mg 3.75 mg 3.75 mg 3.75 mg 3.75 mg 3.75 mg 3.75 mg    Previous description   Take 1&1/2 tablets of your 2.5mg  strength warfarin tablets by mouth daily.    Anticoagulation Dose Instructions as of 04/28/2016      Total Sun Mon Tue Wed Thu Fri Sat   New Dose 27.5 mg 3.75 mg 5 mg 3.75 mg 3.75 mg 3.75 mg 3.75 mg 3.75 mg     (2.5 mg x 1.5)  (2.5 mg x 2)  (2.5 mg x 1.5)  (2.5 mg x 1.5)  (2.5 mg x 1.5)  (2.5 mg x 1.5)  (2.5 mg x 1.5)                         Description         INR today: Subtherapeutic  PLAN Weekly dose was increased by 5% to 27.5 mg per week. I also advised patient to switch to evening warfarin dosing.  Patient Instructions  Patient educated about medication as  defined in this encounter and verbalized understanding by repeating back instructions provided.   Patient advised to contact clinic or seek medical attention if signs/symptoms of bleeding or thromboembolism occur.  Patient verbalized understanding by repeating back information and was advised to contact me if further medication-related questions arise. Patient was also provided an information handout.  Follow-up Return in about 2 weeks (around 05/12/2016) for Follow up INR 05/12/16 around 11am.  Marzetta BoardJennifer Kim

## 2016-05-02 ENCOUNTER — Ambulatory Visit (INDEPENDENT_AMBULATORY_CARE_PROVIDER_SITE_OTHER): Payer: Medicare Other | Admitting: Cardiology

## 2016-05-02 ENCOUNTER — Encounter: Payer: Self-pay | Admitting: Cardiology

## 2016-05-02 VITALS — BP 111/54 | HR 60 | Ht 68.0 in | Wt 174.5 lb

## 2016-05-02 DIAGNOSIS — I4892 Unspecified atrial flutter: Secondary | ICD-10-CM | POA: Diagnosis not present

## 2016-05-02 DIAGNOSIS — I2510 Atherosclerotic heart disease of native coronary artery without angina pectoris: Secondary | ICD-10-CM

## 2016-05-02 MED ORDER — ATORVASTATIN CALCIUM 40 MG PO TABS: 40.0000 mg | ORAL_TABLET | Freq: Every day | ORAL | 3 refills | Status: DC

## 2016-05-02 NOTE — Patient Instructions (Signed)
Medication Instructions:   START ATORVASTATIN 40 MG ONCE DAILY  Labwork:  Your physician recommends that you return for lab work in:4 WEEKS= [facility] AT [address]=1ST FLOOR= DO NOT EAT PRIOR TO LAB WORK  Testing/Procedures:  A chest x-ray takes a picture of the organs and structures inside the chest, including the heart, lungs, and blood vessels. This test can show several things, including, whether the heart is enlarges; whether fluid is building up in the lungs; and whether pacemaker / defibrillator leads are still in place. [facility]  Follow-Up: Your physician wants you to follow-up in: 6 MONTHS WITH DR Shelda PalRENSHAW You will receive a reminder letter in the mail two months in advance. If you don't receive a letter, please call our office to schedule the follow-up appointment.   If you need a refill on your cardiac medications before your next appointment, please call your pharmacy.

## 2016-05-06 NOTE — Telephone Encounter (Signed)
l °

## 2016-05-12 ENCOUNTER — Ambulatory Visit (INDEPENDENT_AMBULATORY_CARE_PROVIDER_SITE_OTHER): Payer: Medicare Other | Admitting: Pharmacist

## 2016-05-12 DIAGNOSIS — Z7901 Long term (current) use of anticoagulants: Secondary | ICD-10-CM | POA: Diagnosis not present

## 2016-05-12 DIAGNOSIS — I480 Paroxysmal atrial fibrillation: Secondary | ICD-10-CM

## 2016-05-12 LAB — POCT INR: INR: 2.1

## 2016-05-12 NOTE — Progress Notes (Signed)
Indication: Paroxysmal atrial fibrillation. Duration: Indefinite. INR: At target. Agree with Dr. Groce's assessment and plan. 

## 2016-05-12 NOTE — Patient Instructions (Signed)
Patient instructed to take medications as defined in the Anti-coagulation Track section of this encounter.  Patient instructed to take today's dose.  Patient instructed to take 2 tablets on Mondays, Wednesdays and Fridays; all other days--take only 1&1/2 tablets.  Patient verbalized understanding of these instructions.

## 2016-05-12 NOTE — Progress Notes (Signed)
Anti-Coagulation Progress Note  [name] GuWilliam R [name] is a 71 y.o. male who is currently on an anti-coagulation regimen.    RECENT RESULTS: Recent results are below, the most recent result is correlated with a dose of 27.5 mg. per week: Lab Results  Component Value Date   INR 2.10 05/12/2016   INR 1.6 04/28/2016   INR 2.40 03/31/2016    ANTI-COAG DOSE: Anticoagulation Dose Instructions as of 05/12/2016      Glynis SmilesSun Mon Tue Wed Thu Fri Sat   New Dose 3.75 mg 5 mg 3.75 mg 5 mg 3.75 mg 5 mg 3.75 mg    Description          ANTICOAG SUMMARY: Anticoagulation Episode Summary    Current INR goal:   2.0-3.0  TTR:   39.5 % (10.5 mo)  Next INR check:   06/02/2016  INR from last check:   2.10 (05/12/2016)  Weekly max dose:     Target end date:     INR check location:     Preferred lab:     Send INR reminders to:        Comments:           ANTICOAG TODAY: Anticoagulation Summary  As of 05/12/2016   INR goal:   2.0-3.0  TTR:     Today's INR:   2.10  Next INR check:   06/02/2016  Target end date:         Anticoagulation Episode Summary    INR check location:      Preferred lab:      Send INR reminders to:      Comments:         PATIENT INSTRUCTIONS: Take 2 tablets on Mondays, Wednesdays and Fridays; all other days, take 1&1/2 tablets of your green 2.5mg  strength warfarin tablets.   FOLLOW-UP Return in 3 weeks (on 06/02/2016) for Follow up INR at 1100h.  Hulen LusterJames Ashayla Subia, III Pharm.D., CACP

## 2016-06-05 ENCOUNTER — Telehealth: Payer: Self-pay | Admitting: Internal Medicine

## 2016-06-05 NOTE — Telephone Encounter (Signed)
APT. REMINDER CALL, NO ANSWER, NO VOICEMAIL °

## 2016-06-06 ENCOUNTER — Ambulatory Visit (INDEPENDENT_AMBULATORY_CARE_PROVIDER_SITE_OTHER): Payer: Medicare Other | Admitting: Internal Medicine

## 2016-06-06 ENCOUNTER — Encounter: Payer: Self-pay | Admitting: Internal Medicine

## 2016-06-06 ENCOUNTER — Ambulatory Visit (INDEPENDENT_AMBULATORY_CARE_PROVIDER_SITE_OTHER): Payer: Medicare Other | Admitting: Pharmacist

## 2016-06-06 VITALS — BP 150/67 | HR 63 | Temp 98.2°F | Wt 174.3 lb

## 2016-06-06 DIAGNOSIS — E663 Overweight: Secondary | ICD-10-CM

## 2016-06-06 DIAGNOSIS — Z6826 Body mass index (BMI) 26.0-26.9, adult: Secondary | ICD-10-CM | POA: Diagnosis not present

## 2016-06-06 DIAGNOSIS — E785 Hyperlipidemia, unspecified: Secondary | ICD-10-CM | POA: Diagnosis not present

## 2016-06-06 DIAGNOSIS — I120 Hypertensive chronic kidney disease with stage 5 chronic kidney disease or end stage renal disease: Secondary | ICD-10-CM

## 2016-06-06 DIAGNOSIS — R198 Other specified symptoms and signs involving the digestive system and abdomen: Secondary | ICD-10-CM

## 2016-06-06 DIAGNOSIS — Z0000 Encounter for general adult medical examination without abnormal findings: Secondary | ICD-10-CM

## 2016-06-06 DIAGNOSIS — Z7901 Long term (current) use of anticoagulants: Secondary | ICD-10-CM

## 2016-06-06 DIAGNOSIS — E039 Hypothyroidism, unspecified: Secondary | ICD-10-CM

## 2016-06-06 DIAGNOSIS — E1122 Type 2 diabetes mellitus with diabetic chronic kidney disease: Secondary | ICD-10-CM

## 2016-06-06 DIAGNOSIS — I10 Essential (primary) hypertension: Secondary | ICD-10-CM

## 2016-06-06 DIAGNOSIS — I4892 Unspecified atrial flutter: Secondary | ICD-10-CM

## 2016-06-06 DIAGNOSIS — Z87891 Personal history of nicotine dependence: Secondary | ICD-10-CM

## 2016-06-06 DIAGNOSIS — Z89511 Acquired absence of right leg below knee: Secondary | ICD-10-CM

## 2016-06-06 DIAGNOSIS — N186 End stage renal disease: Secondary | ICD-10-CM

## 2016-06-06 DIAGNOSIS — E1151 Type 2 diabetes mellitus with diabetic peripheral angiopathy without gangrene: Secondary | ICD-10-CM | POA: Diagnosis not present

## 2016-06-06 DIAGNOSIS — R197 Diarrhea, unspecified: Secondary | ICD-10-CM

## 2016-06-06 DIAGNOSIS — Z992 Dependence on renal dialysis: Secondary | ICD-10-CM

## 2016-06-06 DIAGNOSIS — I25118 Atherosclerotic heart disease of native coronary artery with other forms of angina pectoris: Secondary | ICD-10-CM | POA: Diagnosis not present

## 2016-06-06 DIAGNOSIS — I700 Atherosclerosis of aorta: Secondary | ICD-10-CM | POA: Diagnosis not present

## 2016-06-06 DIAGNOSIS — E7849 Other hyperlipidemia: Secondary | ICD-10-CM

## 2016-06-06 DIAGNOSIS — R195 Other fecal abnormalities: Secondary | ICD-10-CM | POA: Insufficient documentation

## 2016-06-06 DIAGNOSIS — Z79899 Other long term (current) drug therapy: Secondary | ICD-10-CM

## 2016-06-06 LAB — GLUCOSE, CAPILLARY: Glucose-Capillary: 260 mg/dL — ABNORMAL HIGH (ref 65–99)

## 2016-06-06 LAB — POCT INR: INR: 4.8

## 2016-06-06 LAB — POCT GLYCOSYLATED HEMOGLOBIN (HGB A1C): HEMOGLOBIN A1C: 6.7

## 2016-06-06 NOTE — Progress Notes (Signed)
Indication: Paroxysmal atrial flutter. Duration: Indefinite. INR: Above target. Agree with Dr. Elmyra RicksKim's assessment and plan.

## 2016-06-06 NOTE — Assessment & Plan Note (Signed)
Assessment  Cardiology has already ordered a TSH to be drawn at their clinic in one week. This is because he is on amiodarone and has developed an acquired hypothyroidism requiring Synthroid at 25 g by mouth daily.  Plan  We will not draw a TSH today given the planned TSH draw in one week. In the interim we will continue the Synthroid at 25 g by mouth daily given that he has no signs or symptoms consistent with either hypo-or hyperthyroidism at this time.

## 2016-06-06 NOTE — Assessment & Plan Note (Signed)
Assessment  He notes on a very rare occasion he will have a loose stool associated with fecal incontinence. This is very unusual for him, but has occurred. Both he and his wife feel this is related to some of the foods that he eats. I asked him to continue to follow what foods may cause his stools to become loose, and therefore him to become fecally incontinent. He will then try to avoid those foods and we will see if this issue resolves. If this issue does not resolve he is aware he will require further investigation.  Plan  We will reassess the efficacy of changing his diet to avoid any foods that cause loose stools. If this persists we will likely need to refer him for colonoscopy to assess for any colonic disease resulting in the fecal incontinence.

## 2016-06-06 NOTE — Assessment & Plan Note (Signed)
Assessment  His blood pressure was slightly above target today at 150/67. This is an isolated elevated reading. He could not remember if he took his blood pressure medications this morning. His current antihypertensive regimen is carvedilol 6.25 mg by mouth twice daily which she is tolerating well. I am hesitant to escalate his pharmacologic therapy for his essential hypertension given the previous hypotensive episodes that occurred with hemodialysis.  Plan  We will continue the carvedilol at 6.25 mg twice daily and reassess his blood pressure control at the follow-up visit. If his blood pressure remains elevated we will consider slowly escalating his pharmacologic regimen for his essential hypertension.

## 2016-06-06 NOTE — Patient Instructions (Signed)
It was good to see you again.  You are doing better with your diabetes.  1)  Keep taking your medications as you are.  2) Watch your diet and see if anything causes your loose stools.  3) When the weather is better start walking more on your new leg.  I will see you back in 3 months, sooner if necessary.

## 2016-06-06 NOTE — Assessment & Plan Note (Signed)
Assessment  His heart rate today was regular on examination. He denies any palpitations on the carvedilol 6.25 mg by mouth ice daily and amiodarone.  Plan  We will continue the carvedilol at 6.25 mg by mouth twice daily should he convert into atrial fibrillation or flutter. We are also continuing the amiodarone in hopes of keeping him in normal sinus rhythm. His electrophysiologist has ordered several lab tests for one week from now given his amiodarone. I therefore will not check his thyroid studies today as they are planned to be checked next week.

## 2016-06-06 NOTE — Assessment & Plan Note (Signed)
Assessment  He is tolerating the atorvastatin at 40 mg by mouth daily without myalgias.  Plan  We will continue this high intensity statin at the current dose and reassess for intolerances at the follow-up visit.

## 2016-06-06 NOTE — Progress Notes (Signed)
Anticoagulation Management [name] [name] is a 71 y.o. male who reports to the clinic for monitoring of warfarin treatment.    Indication: atrial flutter CHA2DS2 Vasc Score 4, HAS-BLED 4 Duration: indefinite Supervising physician: Doneen PoissonLawrence Klima  Anticoagulation Clinic Visit History: Patient does not report signs/symptoms of bleeding or thromboembolism  Anticoagulation Episode Summary    Current INR goal:   2.0-3.0  TTR:   39.0 % (11.3 mo)  Next INR check:   06/09/2016  INR from last check:   4.8! (06/06/2016)  Weekly max dose:     Target end date:     INR check location:     Preferred lab:     Send INR reminders to:        Comments:          ASSESSMENT Recent Results: Lab Results  Component Value Date   INR 4.8 06/06/2016   INR 2.10 05/12/2016   INR 1.6 04/28/2016   Anticoagulation Dosing: INR as of 06/06/2016 and Previous Dosing Information    INR Dt INR Goal [facility] Tot Sun Mon Tue Wed Thu Fri Sat   06/06/2016 4.8 2.0-3.0 30 mg 3.75 mg 5 mg 3.75 mg 5 mg 3.75 mg 5 mg 3.75 mg    Previous description    Take 2 tablets on Mondays, Wednesdays and Fridays; all other days, take 1&1/2 tablets of your green 2.5mg  strength warfarin tablets.    Anticoagulation Dose Instructions as of 06/06/2016      Total Sun Mon Tue Wed Thu Fri Sat   New Dose 25 mg 3.75 mg 5 mg 3.75 mg 5 mg 3.75 mg 0 mg 3.75 mg     (2.5 mg x 1.5)  (2.5 mg x 2)  (2.5 mg x 1.5)  (2.5 mg x 2)  (2.5 mg x 1.5)  -  (2.5 mg x 1.5)                         Description   INR on Monday 06/09/16     INR today: Supratherapeutic  PLAN Hold warfarin today, re-check INR Monday  Patient Instructions  Patient educated about medication as defined in this encounter and verbalized understanding by repeating back instructions provided.   Patient advised to contact clinic or seek medical attention if signs/symptoms of bleeding or thromboembolism occur.  Patient verbalized understanding by repeating back information  and was advised to contact me if further medication-related questions arise. Patient was also provided an information handout.  Follow-up Return in about 3 days (around 06/09/2016).  Marzetta BoardJennifer Kim

## 2016-06-06 NOTE — Assessment & Plan Note (Signed)
Assessment  He denies any new lesions in the left lower extremity that would be consistent with arterial insufficiency. He also denies any rest pain.  Plan  We will continue the aggressive risk factor modification of his diabetes and hypertension in order to prevent progression of his aortic atherosclerotic disease to a symptomatic state. We will reassess for symptoms of his aortic atherosclerosis at the follow-up visit.

## 2016-06-06 NOTE — Assessment & Plan Note (Signed)
Assessment  He denies any chest pain on his current antianginal regimen which includes carvedilol 6.25 mg by mouth twice daily and atorvastatin 40 mg by mouth daily.  Plan  We will continue the carvedilol at 6.25 mg by mouth twice daily and the high intensity statin for his coronary artery disease with stable angina. We will reassess the efficacy of this therapy at the follow-up visit.

## 2016-06-06 NOTE — Patient Instructions (Signed)
Patient educated about medication as defined in this encounter and verbalized understanding by repeating back instructions provided.   

## 2016-06-06 NOTE — Progress Notes (Signed)
   Subjective:    Patient ID: [name] [name], male    DOB: 05/21/1945, 71 y.o.   MRN: [id number]  HPI  [name] GuWilliam R Kanaan is here for follow-up of his diabetes and multiple associated complications, ESRD requiring hemodialysis, hypertension, and coronary artery disease with chronic stable angina. He also notes a very rare episode of fecal incontinence that he has wife feel is associated with his diet. He denies any weight loss or bloody stools. Please see the A&P for the status of the pt's chronic medical problems.  Review of Systems  Constitutional: Negative for activity change, appetite change and unexpected weight change.  Respiratory: Negative for chest tightness, shortness of breath and wheezing.   Cardiovascular: Negative for chest pain, palpitations and leg swelling.  Gastrointestinal: Positive for nausea. Negative for abdominal distention, abdominal pain, blood in stool, constipation, diarrhea and vomiting.       Rare stool incontinence with a loose stool.  Genitourinary:       Still making urine despite hemodialysis.  Musculoskeletal: Positive for gait problem. Negative for arthralgias, back pain, joint swelling and myalgias.       Is now getting used to his new prosthesis, but has not been ambulating with it as of yet.  Currently only standing with it.  Skin: Negative for color change, rash and wound.  Neurological: Negative for dizziness and light-headedness.      Objective:   Physical Exam  Constitutional: He is oriented to person, place, and time. He appears well-developed and well-nourished. No distress.  HENT:  Head: Normocephalic and atraumatic.  Eyes: Conjunctivae are normal. Pupils are equal, round, and reactive to light. Right eye exhibits no discharge. Left eye exhibits no discharge. No scleral icterus.  Cardiovascular: Normal rate, regular rhythm and normal heart sounds.  Exam reveals no gallop and no friction rub.   No murmur heard. Pulmonary/Chest: Effort normal  and breath sounds normal. No respiratory distress. He has no wheezes. He has no rales.  Abdominal: Soft. Bowel sounds are normal. He exhibits no distension. There is no tenderness. There is no rebound and no guarding.  Musculoskeletal: Normal range of motion. He exhibits deformity. He exhibits no edema or tenderness.  s/p R BKA.  Palpable thrill over RUE graft.  Neurological: He is alert and oriented to person, place, and time. He exhibits normal muscle tone.  Skin: Skin is warm and dry. No rash noted. He is not diaphoretic. No erythema.  Psychiatric: He has a normal mood and affect. His behavior is normal. Judgment and thought content normal.  Nursing note and vitals reviewed.     Assessment & Plan:   Please see problem oriented charting.

## 2016-06-06 NOTE — Assessment & Plan Note (Signed)
Assessment  He has not been checking his blood sugars at home and therefore I'm unable to comment on how well his control is given his underlying end-stage renal disease and faster than usual red blood cell turnover. That being said, his hemoglobin A1c is improved today to 6.7 down from 7.7 3 months ago, suggesting some improvement in his glycemic control with dietary changes assuming no other changes in his red blood cell turnover. He has no claudication, but continues to have absent pulses in the left foot. He now has decreased sensation in several areas of the left foot although not classically dermatomal, but also not classically stocking glove. I suspect he may have early peripheral neuropathy associated with his diabetes.  Plan  We are continuing diet therapy for his diabetes. Again, I have concerns his diabetic control may not be as good as a 6.7 would suggest. I have therefore asked him to check his sugars periodically and bring the sugar log in to the clinic for review at the next visit. With regards to his circulatory disorder associated with his diabetes, we are continuing aggressive risk factor modification as noted in other sections of this problem based note.

## 2016-06-06 NOTE — Assessment & Plan Note (Signed)
Assessment  His weight is up 4 pounds from the last clinic visit. He and his wife admit that he has been eating much more than previously. Although weight gain is generally not a good thing I will say that he looks much more healthy having added these 4 pounds then in the past and reverses his trend of weight loss over the last several years.  Plan  We discussed the importance of moderation in his portion sizes and the particular foods he chooses. We had a long discussion about what was healthy and what would be acceptable on a rare occasion. We will reassess his weight at the follow-up visit.

## 2016-06-06 NOTE — Assessment & Plan Note (Signed)
Assessment  He is tolerating hemodialysis much better than before. He no longer is having frequent hypotensive episodes. He does feel drained after most hemodialysis sessions though.  Plan  We will continue hemodialysis as permanent renal replacement therapy and reassess the tolerance of this intervention at the follow-up visit.

## 2016-06-06 NOTE — Assessment & Plan Note (Signed)
We briefly discussed colon cancer screening during this visit, but decided to defer it until the issue of fecal incontinence could be better addressed as noted above. We will readdress colon cancer screening at the follow-up visit.

## 2016-06-09 ENCOUNTER — Ambulatory Visit (INDEPENDENT_AMBULATORY_CARE_PROVIDER_SITE_OTHER): Payer: Medicare Other | Admitting: Pharmacist

## 2016-06-09 DIAGNOSIS — Z7901 Long term (current) use of anticoagulants: Secondary | ICD-10-CM | POA: Diagnosis not present

## 2016-06-09 DIAGNOSIS — I4892 Unspecified atrial flutter: Secondary | ICD-10-CM | POA: Diagnosis not present

## 2016-06-09 LAB — POCT INR: INR: 3.6

## 2016-06-09 NOTE — Patient Instructions (Signed)
Patient instructed to take medications as defined in the Anti-coagulation Track section of this encounter.  Patient instructed to take today's dose.  Patient instructed to take 1 & 1/2 tablets of your 2.5mg  green-colored warfarin tablets by mouth once-daily at [facility]6PM each day.  Patient verbalized understanding of these instructions.

## 2016-06-09 NOTE — Progress Notes (Signed)
Anti-Coagulation Progress Note  [name] [name] is a 71 y.o. male who is currently on an anti-coagulation regimen.    RECENT RESULTS: Recent results are below, the most recent result is correlated with a dose of 25 mg. per week: Lab Results  Component Value Date   INR 3.60 06/09/2016   INR 4.8 06/06/2016   INR 2.10 05/12/2016    ANTI-COAG DOSE: Anticoagulation Dose Instructions as of 06/09/2016      Glynis SmilesSun Mon Tue Wed Thu Fri Sat   New Dose 3.75 mg 3.75 mg 3.75 mg 3.75 mg 3.75 mg 3.75 mg 3.75 mg    Description   Take 1 & 1/2 tablets of your 2.5mg  green-colored warfarin tablets by mouth once-daily at [facility]6PM each day.       ANTICOAG SUMMARY: Anticoagulation Episode Summary    Current INR goal:   2.0-3.0  TTR:   38.7 % (11.4 mo)  Next INR check:   06/23/2016  INR from last check:   3.60! (06/09/2016)  Weekly max dose:     Target end date:     INR check location:     Preferred lab:     Send INR reminders to:        Comments:           ANTICOAG TODAY: Anticoagulation Summary  As of 06/09/2016   INR goal:   2.0-3.0  TTR:     Today's INR:   3.60!  Next INR check:   06/23/2016  Target end date:         Anticoagulation Episode Summary    INR check location:      Preferred lab:      Send INR reminders to:      Comments:         PATIENT INSTRUCTIONS: Patient Instructions  Patient instructed to take medications as defined in the Anti-coagulation Track section of this encounter.  Patient instructed to take today's dose.  Patient instructed to take 1 & 1/2 tablets of your 2.5mg  green-colored warfarin tablets by mouth once-daily at [facility]6PM each day.  Patient verbalized understanding of these instructions.       FOLLOW-UP Return in 2 weeks (on 06/23/2016) for Follow up INR at 1045h.  Hulen LusterJames Jhovani Griswold, III Pharm.D., CACP

## 2016-06-10 ENCOUNTER — Telehealth: Payer: Self-pay | Admitting: *Deleted

## 2016-06-10 DIAGNOSIS — Z89511 Acquired absence of right leg below knee: Secondary | ICD-10-CM

## 2016-06-10 NOTE — Telephone Encounter (Signed)
WALK-IN Pt's spouse comes in and states that pt occasionally pt c/o of phantom limb, she has given him tylenol but he says it does not help, this is sporadic not daily but he is really uncomfortable and she is very concerned, I ask if they had spoken to you at his last appt and she stated no it had been a while and she wasn't thinking about it and he wasn't either and then today it started up again, please advise

## 2016-06-11 NOTE — Telephone Encounter (Signed)
Dr Josem KaufmannKlima will return tomorrow. IF needs care sooner, may shc appt with [facility]  I did not add s/p BKA to problem list bc I didn't want to mess up Dr Charlesetta ShanksKlima's PL but [facility]

## 2016-06-11 NOTE — Telephone Encounter (Signed)
I have never seen this man. He is Dr Charlesetta ShanksKlima's primary patient. Would defer to Dr Josem KaufmannKlima or have him call his surgeon. I do not know what to do for phantom limb pain.

## 2016-06-12 ENCOUNTER — Encounter: Payer: Self-pay | Admitting: *Deleted

## 2016-06-12 DIAGNOSIS — Z89611 Acquired absence of right leg above knee: Secondary | ICD-10-CM

## 2016-06-12 HISTORY — DX: Acquired absence of right leg above knee: Z89.611

## 2016-06-12 NOTE — Telephone Encounter (Signed)
I tried to call pt also, lm reaffirming need to call clinic and visit tomorrow

## 2016-06-12 NOTE — Telephone Encounter (Signed)
I called the number listed in the chart.  There was no answer.  I left a message that I received his message and would try calling back later to discuss.  I also asked him to call the clinic when he was available so I could return his call.  Finally, I asked him to come to the clinic tomorrow AM at 10:30 so I could address his issue if we were unable to discuss it over the phone later today.  Although the above message did not mention any particular problems I am concerned about this pain.  He has a new prosthetic and presumably is using it more now.  I think it would be very important to reassess the stump given this to make sure there are no pressure ulcers, cellulitis, stump seroma, or stump neuroma before attributing it to phantom limb pain.  There is little available therapy for phantom limb pain if this is the case and I will need to assess if gabapentin has worked in the past.  It is better than placebo, although not effective in a fair percentage of patients who try it for this indication.

## 2016-06-13 ENCOUNTER — Encounter: Payer: Self-pay | Admitting: Internal Medicine

## 2016-06-13 ENCOUNTER — Ambulatory Visit (INDEPENDENT_AMBULATORY_CARE_PROVIDER_SITE_OTHER): Payer: Medicare Other | Admitting: Internal Medicine

## 2016-06-13 VITALS — BP 138/64 | HR 59 | Temp 98.1°F | Wt 172.8 lb

## 2016-06-13 DIAGNOSIS — Z89611 Acquired absence of right leg above knee: Secondary | ICD-10-CM | POA: Diagnosis not present

## 2016-06-13 DIAGNOSIS — G546 Phantom limb syndrome with pain: Secondary | ICD-10-CM

## 2016-06-13 HISTORY — DX: Phantom limb syndrome with pain: G54.6

## 2016-06-13 MED ORDER — GABAPENTIN 300 MG PO CAPS: 300.0000 mg | ORAL_CAPSULE | Freq: Every day | ORAL | 11 refills | Status: AC | PRN

## 2016-06-13 NOTE — Assessment & Plan Note (Signed)
Assessment  Associated with his stump pain is the uncomfortable sensation that he has a right ankle and foot. The sensation is the same as he previously had when he was diagnosed with phantom limb pain. I believe this has recurred and may be stimulated by his new prosthetic. Although his he is unsure of the previous therapy he received it was likely gabapentin, and fortunately he was responsive to this intervention.  Plan  We will try gabapentin 300 mg by mouth once daily as needed for phantom limb pain. He knows to only take it as needed given his underlying renal dysfunction and to only take 1 tablet in a 24-hour period. We will reassess the efficacy of the gabapentin for his intermittent phantom limb pain at the follow-up visit.

## 2016-06-13 NOTE — Assessment & Plan Note (Signed)
Assessment  He is having right stump pain that he associates with the cold weather. It is intermittent and currently not present. He denies any constitutional symptoms and the physical examination is unremarkable for any significant acute abnormality that could be associated with a new prosthetic.  Plan  I encouraged him to take Tylenol as needed for any stump discomfort, but to continue to use his prosthetic as part of his physical rehabilitation. He and his wife know to closely monitor the condition of the skin over the stump and call us if there are any changes. We will reassess his stump discomfort at the follow-up visit.

## 2016-06-13 NOTE — Progress Notes (Signed)
   Subjective:    Patient ID: [name] [name], male    DOB: 02/10/1946, 71 y.o.   MRN: [id number]  HPI  [name] [name] is here for assessment of acute stump pain.  He was in his usual state of health until last week when he started to redevelop right AKA stump pain. He attributes this to the cold weather and states that it was similar to his previous phantom limb pain. He has been using his new prosthetic, but denies any skin breakdown, redness, fevers, shakes, chills, or rash. The pain was described as an ache similar to what he would feel if a hammer was tapping on his stump. He also felt the sensation of an ankle and foot that were uncomfortable on the right side even though he is status post a right AKA. The pain improved slightly with Tylenol, but otherwise will persist throughout the night if present. When he had this pain in the past he states he was on a medication that was effective. He cannot remember the name of that medication and is not sure if it was gabapentin. He is otherwise without any other complaints.  Review of Systems  Constitutional: Negative for activity change, chills and fever.  Cardiovascular: Negative for leg swelling.  Musculoskeletal: Negative for arthralgias, joint swelling and myalgias.       Stump pain and a sensation of a right ankle and foot.  Skin: Negative for color change, pallor, rash and wound.  Neurological: Negative for weakness and numbness.      Objective:   Physical Exam  Constitutional: He is oriented to person, place, and time. He appears well-developed and well-nourished. No distress.  HENT:  Head: Normocephalic and atraumatic.  Eyes: Conjunctivae are normal. Right eye exhibits no discharge. Left eye exhibits no discharge. No scleral icterus.  Musculoskeletal: Normal range of motion. He exhibits no edema, tenderness or deformity.  Neurological: He is alert and oriented to person, place, and time.  Skin: Skin is warm and dry. No rash noted.  He is not diaphoretic. No erythema. No pallor.  Right AKA stump without skin breakdown, rash, erythema, warmth, seroma, or neuroma.  Psychiatric: He has a normal mood and affect. His behavior is normal. Judgment and thought content normal.  Nursing note and vitals reviewed.     Assessment & Plan:   Please see problem oriented charting.

## 2016-06-13 NOTE — Patient Instructions (Signed)
I really appreciate you coming in today so I could take a close look at your stump to make sure something really bad was not going on.  Unfortunately, I think you are again experience phantom limb pain.  1) Try gabapentin 300 mg daily only as needed for stump/phantom limb pain.  I will see you in 3 months as scheduled, sooner if necessary.

## 2016-06-26 ENCOUNTER — Telehealth: Payer: Self-pay | Admitting: *Deleted

## 2016-06-26 NOTE — Telephone Encounter (Signed)
Call from pt's wife - states he's having left thigh pain; unable to bear any weight. Pain started yesterday; he's taking Tylenol and using heating pad. States he's sleep at this time. Requesting for him to be seen today-only appt is 0915 AM. She states unable to get him here at that time. No available appts until Monday @ 1515 PM - decides to take this appt. Instructed to take him to UC or ED if his pain worsens; states she will.

## 2016-06-26 NOTE — Telephone Encounter (Signed)
I called Ms. Waldrep to get more history.  It appears that he has had some left hip pain since last week and that it has gradually worsened.  It has responded somewhat to a heating pad and tylenol.  The pain worsened last night and made it difficult for him to sleep, hence the contact with the clinic this AM.  He has not had any falls or fevers.  I know he has had some difficulty adjusting to the new prosthetic with regards to fit.  The pain is a dull ache in the anterior left groin.  I am concerned about a musculoskeletal process that has been exacerbated by changes in function with the new prosthetic.  As there is hardware in the area from a previous intervention, loosening or malfunction of the hardware also remains in the differential.  Clinically, as best as I can tell over the phone, I do not believe he has a septic joint.  I think it is safe to wait until Monday for an appointment, and Ms. Gaede knows to bring him to Urgent Care over the weekend should the pain worsen or he develop fevers.  They will continue with the as needed heating pain and tylenol in the interim.

## 2016-06-27 ENCOUNTER — Encounter: Payer: Self-pay | Admitting: *Deleted

## 2016-06-30 ENCOUNTER — Ambulatory Visit (INDEPENDENT_AMBULATORY_CARE_PROVIDER_SITE_OTHER): Payer: Medicare Other | Admitting: Internal Medicine

## 2016-06-30 ENCOUNTER — Other Ambulatory Visit: Payer: Medicare Other

## 2016-06-30 VITALS — BP 123/59 | HR 58 | Temp 97.7°F | Ht 68.0 in | Wt 178.6 lb

## 2016-06-30 DIAGNOSIS — I25118 Atherosclerotic heart disease of native coronary artery with other forms of angina pectoris: Secondary | ICD-10-CM

## 2016-06-30 DIAGNOSIS — Z89611 Acquired absence of right leg above knee: Secondary | ICD-10-CM

## 2016-06-30 DIAGNOSIS — B182 Chronic viral hepatitis C: Secondary | ICD-10-CM

## 2016-06-30 DIAGNOSIS — Z79899 Other long term (current) drug therapy: Secondary | ICD-10-CM | POA: Diagnosis not present

## 2016-06-30 DIAGNOSIS — M25552 Pain in left hip: Secondary | ICD-10-CM | POA: Insufficient documentation

## 2016-06-30 MED ORDER — CARVEDILOL 6.25 MG PO TABS: 6.2500 mg | ORAL_TABLET | Freq: Two times a day (BID) | ORAL | 3 refills | Status: AC

## 2016-06-30 NOTE — Assessment & Plan Note (Signed)
[name] [name] presents today following acute onset left hip pain. He reports that last week he had aching pain in his left hip radiating to his groin. Denies any falls or trauma. Denies any pain radiating dow his leg, numbness/tingling, weakness, loss of bowel or bladder function. No fevers or chills. Wife reports that he has a new prosthetic for his right AKA that he is still adjusting to using. He has been Tylenol and heat for pain. Reports that the pain resolved this morning and he currently has no complaints.   No pain or tenderness on exam today.  Assessment: acute left hip pain  Plan: Most likely musculoskeletal in nature 2/2 his new prosthetic right AKA. No evidence of infection. Will continue to treat conservatively with tylenol and heat as needed as pain has resolved today. If symptoms return may need repeat imaging of his left hip.

## 2016-06-30 NOTE — Assessment & Plan Note (Signed)
Requests refill of Coreg today. Refill sent.

## 2016-06-30 NOTE — Progress Notes (Signed)
   CC: Left Hip pain  HPI:  Mr.Matai R Klarich is a 71 y.o. male with a past medical history listed below here today for acute left hip pain.   For details of today's visit and the status of his chronic medical issues please refer to the assessment and plan.   Past Medical History:  Diagnosis Date  . Arthritis    "fingers" (02/07/2015)  . Atrial flutter (HCC) 07/26/2014   a. May 2016, CHA2DS2VASc = 4 -> Eliquis, spontaneous conversion to NSR;  b. On amio;  c. 01/2015 EPS: Unable to induce right sided Aflutter. Non-sustained Afib and Left sided Aflutter noted.  . Benign prostatic hypertrophy with nocturia 07/07/2006  . Cardiomyopathy (HCC)    a. 07/2014 Echo: EF 50%; b. 03/2015 Ech: EF 20-25%; c. 07/2015 Echo: EF 30-35%, Gr1 DD, sev LVH.  [name] Chronic venous insufficiency 04/12/2010  . Closed displaced fracture of left femoral neck (HCC) 06/14/2014   s/p left hip hemiarthroplasty June 14, 2014   . Constipation 05/25/2009   Intermittent   . Coronary artery disease 04/02/2006   a. s/p RCA DES (2.5 x 24 mm TAXUS drug-eluting stent) 2006;  b. low risk myoview in 2011; c. 5/17 MV: EF 28%, large inf apical and inflat infarct w/o ischemia.  . Degenerative joint disease involving multiple joints 02/02/2007  . End stage renal disease on dialysis [facility])    a. TTS Dialysis - currently through right chest diatek, pending AVF.  [name] GERD (gastroesophageal reflux disease)   . Hepatitis    Hep C  . History of blood transfusion 05/2014   "related to hip OR"  . 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 Hyperlipidemia 04/02/2006  . Hypertension   . Hypertensive heart disease 12/09/2006  . Hypothyroidism   . Internal and external hemorrhoids without complication 08/20/2012  . Microcytic normochromic anemia 05/27/2006  . Obesity (BMI 30.0-34.9) 01/15/2012  . Phantom limb pain (HCC) 06/13/2016  . 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 Type 2 diabetes mellitus with peripheral artery disease (HCC) 05/27/2006   Absent pulses in the left foot   . Type 2 diabetes mellitus with stage 5 chronic kidney disease (HCC) 08/06/2012    Review of Systems:   See HPI  Physical Exam:  Vitals:   06/30/16 1454  BP: (!) 123/59  Pulse: (!) 58  Temp: 97.7 F (36.5 C)  TempSrc: Oral  SpO2: 98%  Weight: 178 lb 9.6 oz (81 kg)  Height:  (1.727 m)   Physical Exam  Constitutional: He is well-developed, well-nourished, and in no distress. No distress.  Cardiovascular: Normal rate and regular rhythm.   Pulmonary/Chest: Effort normal and breath sounds normal.  Musculoskeletal:       Left hip: He exhibits no tenderness, no bony tenderness and no swelling.       Left knee: Normal.       Left ankle: Normal.  Vitals reviewed.    Assessment & Plan:   See Encounters Tab for problem based charting.  Patient discussed with Dr. Josem Kaufmann

## 2016-06-30 NOTE — Patient Instructions (Signed)
[name] [name],  I am glad your hip pain is doing better today. If it keep coming back please let us know but I do not think it is anything to worry about right now.

## 2016-07-01 NOTE — Progress Notes (Signed)
Case discussed with Dr. Boswell at the time of the visit.  We reviewed the resident's history and exam and pertinent patient test results.  I agree with the assessment, diagnosis and plan of care documented in the resident's note. 

## 2016-07-02 LAB — HEPATITIS C RNA QUANTITATIVE
HCV QUANT LOG: NOT DETECTED {Log_IU}/mL
HCV QUANT: NOT DETECTED [IU]/mL

## 2016-07-14 ENCOUNTER — Ambulatory Visit (INDEPENDENT_AMBULATORY_CARE_PROVIDER_SITE_OTHER): Payer: Medicare Other | Admitting: Pharmacist

## 2016-07-14 ENCOUNTER — Ambulatory Visit (INDEPENDENT_AMBULATORY_CARE_PROVIDER_SITE_OTHER): Payer: Medicare Other | Admitting: Internal Medicine

## 2016-07-14 ENCOUNTER — Encounter: Payer: Self-pay | Admitting: Internal Medicine

## 2016-07-14 DIAGNOSIS — Z7901 Long term (current) use of anticoagulants: Secondary | ICD-10-CM

## 2016-07-14 DIAGNOSIS — B182 Chronic viral hepatitis C: Secondary | ICD-10-CM

## 2016-07-14 DIAGNOSIS — K7400 Hepatic fibrosis, unspecified: Secondary | ICD-10-CM

## 2016-07-14 LAB — POCT INR: INR: 2.4

## 2016-07-14 NOTE — Progress Notes (Signed)
Anti-Coagulation Progress Note  Dainel QUINTO TIPPY 71 y.o. male who is currently on an anti-coagulation regimen.    RECENT RESULTS: Recent results are below, the most recent result is correlated with a dose of 26.25 mg. per week: Lab Results  Component Value Date   INR 2.40 07/14/2016   INR 3.60 06/09/2016   INR 4.8 06/06/2016    ANTI-COAG DOSE: Anticoagulation Dose Instructions as of 07/14/2016      [name] Tue Wed Thu Fri Sat   New Dose 3.75 mg 5 mg 3.75 mg 3.75 mg 5 mg 3.75 mg 3.75 mg    Description   Take 1 & 1/2 tablets of your 2.5mg  green-colored warfarin tablets by mouth once-daily at 6PM each day--EXCEPT on MONDAYS and THURSDAYS--take 2 tablets on these days of each week.        ANTICOAG SUMMARY: Anticoagulation Episode Summary    Current INR goal:   2.0-3.0  TTR:   39.7 % (1 y)  Next INR check:   07/28/2016  INR from last check:     Weekly max dose:     Target end date:     INR check location:     Preferred lab:     Send INR reminders to:        Comments:           ANTICOAG TODAY: Anticoagulation Summary  As of 07/14/2016   INR goal:   2.0-3.0  TTR:     Today's INR:     Next INR check:   07/28/2016  Target end date:         Anticoagulation Episode Summary    INR check location:      Preferred lab:      Send INR reminders to:      Comments:         PATIENT INSTRUCTIONS: Patient Instructions  Patient instructed to take medications as defined in the Anti-coagulation Track section of this encounter.  Patient instructed to take today's dose.  Patient instructed to take  1 & 1/2 tablets of your 2.5mg  green-colored warfarin tablets by mouth once-daily at 6PM each day--EXCEPT on MONDAYS and THURSDAYS--take 2 tablets on these days of each week Patient verbalized understanding of these instructions.       FOLLOW-UP Return in 2 weeks (on 07/28/2016) for Follow up INR at 1100h.  Hulen Luster, III Pharm.D., CACP .

## 2016-07-14 NOTE — Assessment & Plan Note (Signed)
Now considered cured.  

## 2016-07-14 NOTE — Patient Instructions (Signed)
Patient instructed to take medications as defined in the Anti-coagulation Track section of this encounter.  Patient instructed to take today's dose.  Patient instructed to take  1 & 1/2 tablets of your 2.5mg  green-colored warfarin tablets by mouth once-daily at 6PM each day--EXCEPT on MONDAYS and THURSDAYS--take 2 tablets on these days of each week Patient verbalized understanding of these instructions.

## 2016-07-14 NOTE — Progress Notes (Signed)
   Subjective:    Patient ID: [name] [name], male    DOB: [birth year], 71 y.o.   MRN: [id number]  HPI Here for follow up of HCV.  Has a history of ESRD on dialysis and started on Zepatier in July.  Completed treatment over 6 months ago and SVR 24 remains undetectable.  Elastography with F2/3, no liver abnormalities noted. No new complaints.     Review of Systems  Constitutional: Negative for fatigue.  Skin: Negative for rash.  Neurological: Negative for headaches.       Objective:   Physical Exam  Constitutional: He appears well-developed and well-nourished.  Eyes: No scleral icterus.  Cardiovascular: Normal rate, regular rhythm and normal heart sounds.   Skin: No rash noted.          Assessment & Plan:

## 2016-07-14 NOTE — Assessment & Plan Note (Signed)
Moderate.  No indication for HCC screening 

## 2016-07-18 ENCOUNTER — Ambulatory Visit (HOSPITAL_COMMUNITY)
Admission: RE | Admit: 2016-07-18 | Discharge: 2016-07-18 | Disposition: A | Discharge status: Home / Self Care | Payer: Medicare Other | Source: Ambulatory Visit | Attending: Cardiology | Admitting: Cardiology

## 2016-07-18 DIAGNOSIS — Z79899 Other long term (current) drug therapy: Secondary | ICD-10-CM | POA: Diagnosis not present

## 2016-07-18 DIAGNOSIS — I4892 Unspecified atrial flutter: Secondary | ICD-10-CM

## 2016-07-28 ENCOUNTER — Ambulatory Visit (INDEPENDENT_AMBULATORY_CARE_PROVIDER_SITE_OTHER): Payer: Medicare Other | Admitting: Pharmacist

## 2016-07-28 DIAGNOSIS — I4892 Unspecified atrial flutter: Secondary | ICD-10-CM | POA: Diagnosis not present

## 2016-07-28 DIAGNOSIS — Z7901 Long term (current) use of anticoagulants: Secondary | ICD-10-CM

## 2016-07-28 DIAGNOSIS — I4891 Unspecified atrial fibrillation: Secondary | ICD-10-CM

## 2016-07-28 LAB — POCT INR: INR: 2.2

## 2016-07-28 MED ORDER — WARFARIN SODIUM 2.5 MG PO TABS: ORAL_TABLET | ORAL | 2 refills | Status: DC

## 2016-07-28 NOTE — Progress Notes (Signed)
Anti-Coagulation Progress Note  [name] [name] is a 71 y.o. male who is currently on an anti-coagulation regimen for Paroxysmal atrial flutter (HCC) [148.92], long term (current) use of anticoagulants [Z79.01]:  RECENT RESULTS: Recent results are below, the most recent result is correlated with a dose of 28.75 mg. per week: Lab Results  Component Value Date   INR 2.20 07/28/2016   INR 2.40 07/14/2016   INR 3.60 06/09/2016    ANTI-COAG DOSE: Anticoagulation Dose Instructions as of 07/28/2016      Glynis SmilesSun Mon Tue Wed Thu Fri Sat   New Dose 3.75 mg 5 mg 3.75 mg 3.75 mg 5 mg 3.75 mg 3.75 mg    Description   Take 1 & 1/2 tablets of your 2.5mg  green-colored warfarin tablets by mouth once-daily at 6PM each day--EXCEPT on MONDAYS and THURSDAYS--take 2 tablets on these days of each week.        ANTICOAG SUMMARY: Anticoagulation Episode Summary    Current INR goal:   2.0-3.0  TTR:   41.9 % (1.1 y)  Next INR check:   08/25/2016  INR from last check:   2.20 (07/28/2016)  Weekly max dose:     Target end date:     INR check location:     Preferred lab:     Send INR reminders to:        Comments:           ANTICOAG TODAY: Anticoagulation Summary  As of 07/28/2016   INR goal:   2.0-3.0  TTR:     Today's INR:   2.20  Next INR check:   08/25/2016  Target end date:         Anticoagulation Episode Summary    INR check location:      Preferred lab:      Send INR reminders to:      Comments:         PATIENT INSTRUCTIONS: Patient Instructions  Patient instructed to take medications as defined in the Anti-coagulation Track section of this encounter.  Patient instructed to take today's dose.  Patient instructed to take 2 tablets on Mondays and Thursdays; all other days of week--take 1 & 1/2 tablets of your 2.5mg  green-colored warfarin tablets.  Patient verbalized understanding of these instructions.       FOLLOW-UP Return in 4 weeks (on 08/25/2016) for Follow up INR at 1100h.  Hulen LusterJames  Analissa Bayless, III Pharm.D., CACP

## 2016-07-28 NOTE — Patient Instructions (Signed)
Patient instructed to take medications as defined in the Anti-coagulation Track section of this encounter.  Patient instructed to take today's dose.  Patient instructed to take 2 tablets on Mondays and Thursdays; all other days of week--take 1 & 1/2 tablets of your 2.5mg  green-colored warfarin tablets.  Patient verbalized understanding of these instructions.

## 2016-07-29 NOTE — Progress Notes (Signed)
INTERNAL MEDICINE TEACHING ATTENDING ADDENDUM - Gust RungHoffman, Kirt Chew C, DO Duration- indefinate, Indication- a fib, INR-  Lab Results  Component Value Date   INR 2.20 07/28/2016  . Agree with pharmacy recommendations as outlined in their note.

## 2016-08-20 ENCOUNTER — Encounter (HOSPITAL_COMMUNITY): Payer: Self-pay | Admitting: Emergency Medicine

## 2016-08-20 ENCOUNTER — Emergency Department (HOSPITAL_COMMUNITY)
Admission: EM | Admit: 2016-08-20 | Discharge: 2016-08-20 | Disposition: A | Discharge status: Home / Self Care | Payer: Medicare Other | Attending: Emergency Medicine | Admitting: Emergency Medicine

## 2016-08-20 ENCOUNTER — Emergency Department (HOSPITAL_COMMUNITY): Payer: Medicare Other

## 2016-08-20 DIAGNOSIS — I2510 Atherosclerotic heart disease of native coronary artery without angina pectoris: Secondary | ICD-10-CM | POA: Diagnosis not present

## 2016-08-20 DIAGNOSIS — R0789 Other chest pain: Secondary | ICD-10-CM | POA: Diagnosis not present

## 2016-08-20 DIAGNOSIS — E1122 Type 2 diabetes mellitus with diabetic chronic kidney disease: Secondary | ICD-10-CM | POA: Insufficient documentation

## 2016-08-20 DIAGNOSIS — Z96642 Presence of left artificial hip joint: Secondary | ICD-10-CM | POA: Insufficient documentation

## 2016-08-20 DIAGNOSIS — R079 Chest pain, unspecified: Secondary | ICD-10-CM

## 2016-08-20 DIAGNOSIS — N186 End stage renal disease: Secondary | ICD-10-CM | POA: Insufficient documentation

## 2016-08-20 DIAGNOSIS — Z992 Dependence on renal dialysis: Secondary | ICD-10-CM | POA: Diagnosis not present

## 2016-08-20 DIAGNOSIS — I1311 Hypertensive heart and chronic kidney disease without heart failure, with stage 5 chronic kidney disease, or end stage renal disease: Secondary | ICD-10-CM | POA: Diagnosis not present

## 2016-08-20 DIAGNOSIS — D631 Anemia in chronic kidney disease: Secondary | ICD-10-CM

## 2016-08-20 DIAGNOSIS — E876 Hypokalemia: Secondary | ICD-10-CM | POA: Diagnosis not present

## 2016-08-20 DIAGNOSIS — E039 Hypothyroidism, unspecified: Secondary | ICD-10-CM | POA: Diagnosis not present

## 2016-08-20 DIAGNOSIS — Z87891 Personal history of nicotine dependence: Secondary | ICD-10-CM | POA: Diagnosis not present

## 2016-08-20 DIAGNOSIS — D696 Thrombocytopenia, unspecified: Secondary | ICD-10-CM | POA: Insufficient documentation

## 2016-08-20 DIAGNOSIS — Z7901 Long term (current) use of anticoagulants: Secondary | ICD-10-CM | POA: Insufficient documentation

## 2016-08-20 DIAGNOSIS — E113493 Type 2 diabetes mellitus with severe nonproliferative diabetic retinopathy without macular edema, bilateral: Secondary | ICD-10-CM | POA: Diagnosis not present

## 2016-08-20 DIAGNOSIS — Z955 Presence of coronary angioplasty implant and graft: Secondary | ICD-10-CM | POA: Diagnosis not present

## 2016-08-20 LAB — I-STAT TROPONIN, ED
Troponin i, poc: 0.01 ng/mL (ref 0.00–0.08)
Troponin i, poc: 0.01 ng/mL (ref 0.00–0.08)

## 2016-08-20 LAB — CBC
HEMATOCRIT: 32.4 % — AB (ref 39.0–52.0)
Hemoglobin: 10.4 g/dL — ABNORMAL LOW (ref 13.0–17.0)
MCH: 25.4 pg — ABNORMAL LOW (ref 26.0–34.0)
MCHC: 32.1 g/dL (ref 30.0–36.0)
MCV: 79.2 fL (ref 78.0–100.0)
Platelets: 104 10*3/uL — ABNORMAL LOW (ref 150–400)
RBC: 4.09 MIL/uL — ABNORMAL LOW (ref 4.22–5.81)
RDW: 17.6 % — AB (ref 11.5–15.5)
WBC: 6.4 10*3/uL (ref 4.0–10.5)

## 2016-08-20 LAB — BASIC METABOLIC PANEL
Anion gap: 13 (ref 5–15)
BUN: 29 mg/dL — AB (ref 6–20)
CHLORIDE: 93 mmol/L — AB (ref 101–111)
CO2: 30 mmol/L (ref 22–32)
Calcium: 8.4 mg/dL — ABNORMAL LOW (ref 8.9–10.3)
Creatinine, Ser: 5.16 mg/dL — ABNORMAL HIGH (ref 0.61–1.24)
GFR calc Af Amer: 12 mL/min — ABNORMAL LOW (ref >60)
GFR calc non Af Amer: 10 mL/min — ABNORMAL LOW (ref >60)
Glucose, Bld: 190 mg/dL — ABNORMAL HIGH (ref 65–99)
POTASSIUM: 3.1 mmol/L — AB (ref 3.5–5.1)
Sodium: 136 mmol/L (ref 135–145)

## 2016-08-20 LAB — PROTIME-INR
INR: 2.3
Prothrombin Time: 25.7 seconds — ABNORMAL HIGH (ref 11.4–15.2)

## 2016-08-20 MED ORDER — POTASSIUM CHLORIDE CRYS ER 20 MEQ PO TBCR
40.0000 meq | EXTENDED_RELEASE_TABLET | Freq: Once | ORAL | Status: AC
  Administered 2016-08-20: 40 meq via ORAL
  Filled 2016-08-20: qty 2

## 2016-08-20 NOTE — ED Notes (Signed)
Pt presents with left chest pressure that started last night.  Resolved with Tums.

## 2016-08-20 NOTE — Discharge Instructions (Signed)
ER evaluation did not show any sign of heart problems today. You are INR is therapeutic, so you do not need to change your dose of warfarin. Please follow up with your primary care provider, and with your cardiologist. Return to the emergency department for any problems.

## 2016-08-20 NOTE — ED Provider Notes (Signed)
[facility]-EMERGENCY DEPT Provider Note   CSN: [id number] Arrival date & time: 08/20/16  0208     History   Chief Complaint Chief Complaint  Patient presents with  . Chest Pain    HPI [name] [name] is a 71 y.o. male.  The history is provided by the patient and the spouse.  Chest Pain    He has a history of coronary artery disease status post stent placement, chronic anticoagulation for atrial fibrillation/atrial flutter, and end-stage renal disease on hemodialysis. Following dialysis yesterday, at about 6 PM, he developed a left-sided chest pain. He is unable to characterize it. There is no radiation of pain and no associated dyspnea or nausea or diaphoresis. His wife gave him some Tums at about 8 PM, and pain went away completely and has not recurred. Pain did occur after eating some chicken nuggets.  Past Medical History:  Diagnosis Date  . Arthritis    "fingers" (02/07/2015)  . Atrial flutter (HCC) 07/26/2014   a. May 2016, CHA2DS2VASc = 4 -> Eliquis, spontaneous conversion to NSR;  b. On amio;  c. 01/2015 EPS: Unable to induce right sided Aflutter. Non-sustained Afib and Left sided Aflutter noted.  . Benign prostatic hypertrophy with nocturia 07/07/2006  . Cardiomyopathy (HCC)    a. 07/2014 Echo: EF 50%; b. 03/2015 Ech: EF 20-25%; c. 07/2015 Echo: EF 30-35%, Gr1 DD, sev LVH.  [name]. Chronic venous insufficiency 04/12/2010  . Closed displaced fracture of left femoral neck (HCC) 06/14/2014   s/p left hip hemiarthroplasty June 14, 2014   . Constipation 05/25/2009   Intermittent   . Coronary artery disease 04/02/2006   a. s/p RCA DES (2.5 x 24 mm TAXUS drug-eluting stent) 2006;  b. low risk myoview in 2011; c. 5/17 MV: EF 28%, large inf apical and inflat infarct w/o ischemia.  . Degenerative joint disease involving multiple joints 02/02/2007  . End stage renal disease on dialysis [facility](HCC)    a. TTS Dialysis - currently through right chest diatek, pending AVF.  [name]. GERD (gastroesophageal reflux  disease)   . Hepatitis    Hep C  . History of blood transfusion 05/2014   "related to hip OR"  . 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 Hyperlipidemia 04/02/2006  . Hypertension   . Hypertensive heart disease 12/09/2006  . Hypothyroidism   . Internal and external hemorrhoids without complication 08/20/2012  . Microcytic normochromic anemia 05/27/2006  . Obesity (BMI 30.0-34.9) 01/15/2012  . Phantom limb pain (HCC) 06/13/2016  . 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 Type 2 diabetes mellitus with peripheral artery disease (HCC) 05/27/2006   Absent pulses in the left foot   . Type 2 diabetes mellitus with stage 5 chronic kidney disease (HCC) 08/06/2012    Patient Active Problem List   Diagnosis Date Noted  . Long term (current) use of anticoagulants [Z79.01] 07/28/2016  . Acute hip pain, left 06/30/2016  . Phantom limb pain (HCC) 06/13/2016  . 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 Passage of loose stools 06/06/2016  . Type 2 diabetes mellitus with both eyes affected by  severe nonproliferative retinopathy without macular edema, without long-term current use of insulin (HCC) 03/03/2016  . Liver fibrosis 12/31/2015  . Hyperlipidemia 08/10/2015  . End stage renal disease on dialysis (HCC)   . Hypothyroidism, acquired 12/12/2014  . Chronic hepatitis C without hepatic coma (HCC) 12/11/2014  . Aortic atherosclerosis (HCC) 11/30/2014  . Right bundle branch block (RBBB) 07/27/2014  . Paroxysmal atrial flutter (HCC) 07/26/2014  . Gastroesophageal reflux  disease without esophagitis 07/01/2013  . Internal and external hemorrhoids without complication 08/20/2012  . Type 2 diabetes mellitus with end-stage renal disease (HCC) 08/06/2012  . Overweight (BMI 25.0-29.9) 01/15/2012  . Healthcare maintenance 12/12/2010  . Chronic venous insufficiency 04/12/2010  . Seasonal allergic rhinitis 05/25/2009  . Degenerative joint disease involving multiple joints 02/02/2007  . Essential hypertension 12/09/2006  . 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 Anemia of chronic renal failure, stage 5 (HCC) 05/27/2006  . Type 2 diabetes mellitus with peripheral artery disease (HCC) 05/27/2006  . Coronary artery disease of native artery of native heart with stable angina pectoris (HCC) 04/02/2006    Past Surgical History:  Procedure Laterality Date  . AV FISTULA PLACEMENT Left 10/08/2015   Procedure:  BRACHIOCEPHALIC ARTERIOVENOUS (AV) FISTULA CREATION Right Arm;  Surgeon: Fransisco Hertz, MD;  Location: [facility] OR;  Service: Vascular;  Laterality: Left;  . CARDIAC CATHETERIZATION N/A 09/21/2015   Procedure: Left Heart Cath and Coronary Angiography;  Surgeon: Marykay Lex, MD;  Location: [facility] INVASIVE CV LAB;  Service: Cardiovascular;  Laterality: N/A;  . CHOLECYSTECTOMY N/A 12/12/2014   Procedure: LAPAROSCOPIC CHOLECYSTECTOMY WITH INTRAOPERATIVE CHOLANGIOGRAM;  Surgeon: Gaynelle Adu, MD;  Location: [facility] OR;  Service: General;  Laterality: N/A;  . CORONARY ANGIOPLASTY WITH STENT PLACEMENT    . ELECTROPHYSIOLOGIC STUDY N/A 02/07/2015   Procedure: A-Flutter Ablation;  Surgeon: Marinus Maw, MD;  Location: [facility] INVASIVE CV LAB;  Service: Cardiovascular;  Laterality: N/A;  . FRACTURE SURGERY    . INSERTION OF DIALYSIS CATHETER Right 06/13/2015   Procedure: INSERTION OF DIALYSIS CATHETER RIGHT INTERNAL JUGULAR;  Surgeon: Larina Earthly, MD;  Location: [facility] OR;  Service: Vascular;  Laterality: Right;  . JOINT REPLACEMENT    .  LEG AMPUTATION ABOVE KNEE Right ~ 2008  . PILONIDAL CYST EXCISION  1990's  . PROSTATE BIOPSY  ~ 2013  . REFRACTIVE SURGERY Bilateral   . REMOVAL OF A DIALYSIS CATHETER Right 06/13/2015   Procedure: REMOVAL OF A DIALYSIS CATHETER;  Surgeon: Larina Earthly, MD;  Location: [facility] OR;  Service: Vascular;  Laterality: Right;  . TONSILLECTOMY    . TOTAL HIP ARTHROPLASTY Left 06/14/2014   Procedure: HEMI HIP ARTHROPLASTY ANTERIOR APPROACH;  Surgeon: Tarry Kos, MD;  Location: [facility] OR;  Service: Orthopedics;  Laterality: Left;       Home Medications    Prior to Admission medications   Medication Sig Start Date End Date Taking? Authorizing Provider  amiodarone (PACERONE) 200 MG tablet Take 1 tablet (200 mg total) by mouth daily. 10/01/15   Doneen Poisson, MD  atorvastatin (LIPITOR) 40 MG tablet Take 1 tablet (40 mg total) by mouth daily. 05/02/16 07/31/16  Lewayne Bunting, MD  calcium citrate-vitamin D (CITRACAL+D) 315-200 MG-UNIT per tablet Take 1 tablet by mouth daily.     [provider]  carvedilol (COREG) 6.25 MG tablet Take 1 tablet (6.25 mg total) by mouth 2 (two) times daily. 06/30/16   Valentino Nose, MD  famotidine (PEPCID) 20 MG tablet TAKE  ONE TABLET BY MOUTH BEFORE BREAKFAST AND ONE TABLET BEFORE SUPPER 03/19/16   Lewayne Bunting, MD  fish oil-omega-3 fatty acids 1000 MG capsule Take 1 g by mouth daily. 01/15/12   Doneen Poisson, MD  fluticasone (FLONASE) 50 MCG/ACT nasal spray Place 1 spray into both nostrils daily as needed for allergies. 12/18/15   Doneen Poisson, MD  gabapentin (NEURONTIN) 300 MG capsule Take 1 capsule (300 mg total) by mouth daily as needed (for stump pain). 06/13/16   Doneen Poisson, MD  hydroxypropyl methylcellulose (ISOPTO TEARS) 2.5 % ophthalmic solution Place 2 drops into both eyes daily as needed for dry eyes. Reported on 04/20/2015    [provider]  levothyroxine (SYNTHROID, LEVOTHROID) 25 MCG tablet Take 1 tablet (25 mcg total) by mouth daily  before breakfast. 06/01/15   Doneen Poisson, MD  Multiple Vitamins-Minerals (MULTIVITAMIN WITH MINERALS) tablet Take 1 tablet by mouth daily.    [provider]  ondansetron (ZOFRAN) 4 MG tablet Take 1 tablet (4 mg total) by mouth every 8 (eight) hours as needed for nausea. 03/03/16   Doneen Poisson, MD  warfarin (COUMADIN) 2.5 MG tablet Take 1 & 1/2 tablets all days of week--EXCEPT on Mondays and Thursdays, take TWO (2) tablets on Mondays and Thursdays. 07/28/16   Gust Rung, DO    Family History Family History  Problem Relation Age of Onset  . Hypertension Mother   . Heart attack Father   . Breast cancer Sister   . Arthritis Sister   . Heart attack Brother   . Diabetes Brother   . Stroke Brother   . Pneumonia Daughter   . Diabetes Brother   . Alcoholism Brother   . Diabetes Brother   . Arthritis Sister        Bilateral knee replacement  . HIV Daughter   . Hypertension Daughter   . Drug abuse Daughter   . Schizophrenia Daughter   . Hypertension Daughter   . Drug abuse Daughter   . Bipolar disorder Daughter     Social History Social History  Substance Use Topics  . Smoking status: Former Smoker    Packs/day: 1.00    Years: 10.00    Types: Cigarettes    Quit date: 06/16/1968  . Smokeless tobacco: Never Used  . Alcohol use No     Comment: "quit alcohol in the 1960's"     Allergies   Lisinopril; Amoxicillin; Pravastatin; Tamsulosin; and Doxycycline   Review of Systems Review of Systems  Cardiovascular: Positive for chest pain.  All other systems reviewed and are negative.    Physical Exam Updated Vital Signs BP (!) 96/55 (BP Location: Left Arm)   Pulse 64   Temp (!) 100.5 F (38.1 C) (Oral)   Resp 16   SpO2 100%   Physical Exam  Nursing note and vitals reviewed.  71 year old male, resting comfortably and in no acute distress. Vital signs are significant for low-grade fever. Oxygen saturation is 100%, which is normal. Head is normocephalic  and atraumatic. PERRLA, EOMI. Oropharynx is clear. Neck is nontender and supple without adenopathy or JVD. Back is nontender and there is no CVA tenderness. Lungs are clear without rales, wheezes, or rhonchi. Chest is nontender. Heart has regular rate and rhythm without murmur. Abdomen is soft, flat, nontender without masses or hepatosplenomegaly and peristalsis is normoactive. Extremities: Right above-the-knee amputation. No cyanosis or edema, full range of motion is present. AV fistula present in right upper arm with thrill present. Skin is  warm and dry without rash. Neurologic: Mental status is normal, cranial nerves are intact, there are no motor or sensory deficits.  ED Treatments / Results  Labs (all labs ordered are listed, but only abnormal results are displayed) Labs Reviewed  BASIC METABOLIC PANEL - Abnormal; Notable for the following:       Result Value   Potassium 3.1 (*)    Chloride 93 (*)    Glucose, Bld 190 (*)    BUN 29 (*)    Creatinine, Ser 5.16 (*)    Calcium 8.4 (*)    GFR calc non Af Amer 10 (*)    GFR calc Af Amer 12 (*)    All other components within normal limits  CBC - Abnormal; Notable for the following:    RBC 4.09 (*)    Hemoglobin 10.4 (*)    HCT 32.4 (*)    MCH 25.4 (*)    RDW 17.6 (*)    Platelets 104 (*)    All other components within normal limits  PROTIME-INR  I-STAT TROPOININ, ED  I-STAT TROPOININ, ED    EKG  EKG Interpretation  Date/Time:  Wednesday Aug 20 2016 02:14:33 EDT Ventricular Rate:  72 PR Interval:  166 QRS Duration: 146 QT Interval:  482 QTC Calculation: 527 R Axis:   -50 Text Interpretation:  Normal sinus rhythm Right bundle branch block Left anterior fascicular block ** Bifascicular block ** Minimal voltage criteria for LVH, may be normal variant Abnormal ECG When compared with ECG of 8/208/2017, No significant change was found Confirmed by Dione Booze ([id number]) on 08/20/2016 2:17:01 AM       Radiology Dg Chest 2  View  Result Date: 08/20/2016 CLINICAL DATA:  71 year old male with left-sided chest pain. EXAM: CHEST  2 VIEW COMPARISON:  Chest radiograph dated 07/18/2016 FINDINGS: The lungs are clear. There is no pleural effusion or pneumothorax. The cardiac silhouette is within normal limits. Coronary vascular calcifications noted. No acute osseous pathology. IMPRESSION: No active cardiopulmonary disease. Electronically Signed   By: Elgie Collard M.D.   On: 08/20/2016 03:08    Procedures Procedures (including critical care time)  Medications Ordered in ED Medications - No data to display   Initial Impression / Assessment and Plan / ED Course  I have reviewed the triage vital signs and the nursing notes.  Pertinent labs & imaging results that were available during my care of the patient were reviewed by me and considered in my medical decision making (see chart for details).  Chest pain which has been completely resolved following administration of Tums at home. ECG shows no change from baseline, and initial troponin is negative. With patient being pain-free, if second troponin is also negative, he should be safe for discharge. Old records are reviewed, and he is followed by cardiology for atrial fibrillation/atrial flutter, and also for coronary artery disease. Catheterization in 2017 did show 65% in-stent stenosis and 99% occlusion distal to the stent in the right coronary artery. There was some recanalization and this was deemed something that should be managed by medical therapy. While he is here, will check INR.  Repeat troponin is unchanged, INR is therapeutic. No indication of any acute cardiac event. He is discharged with instructions to follow-up with primary care provider and with his cardiologist. Of note, potassium has come back at 3.1. He is given a single dose of oral potassium. Since he is on chronic hemodialysis, at no additional potassium is given.  CHA2DS2/VAS Stroke Risk Points  5  >= 2 Points: High Risk  1 - 1.99 Points: Medium Risk  0 Points: Low Risk    The previous score was 4 on 11/08/2015.:  Change:         Details    Note: External data might be a factor in metrics not marked with    Points Metrics   This score determines the patient's risk of having a stroke if the  patient has atrial fibrillation.       1 Has Congestive Heart Failure:  Yes   1 Has Vascular Disease:  Yes   1 Has Hypertension:  Yes   1 Age:  56   1 Has Diabetes:  Yes   0 Had Stroke:  No Had TIA:  No Had thromboembolism:  No   0 Male:  No          Final Clinical Impressions(s) / ED Diagnoses   Final diagnoses:  Nonspecific chest pain  End-stage renal disease on hemodialysis (HCC)  Hypokalemia  Anemia in chronic kidney disease, on chronic dialysis (HCC)  Thrombocytopenia (HCC)  Anticoagulated on warfarin    New Prescriptions New Prescriptions   No medications on file     Dione Booze, MD 08/20/16 [phone]

## 2016-08-20 NOTE — ED Triage Notes (Signed)
Patient reports intermittent left chest pain with mild SOB and nausea onset last night , denies emesis or diaphoresis , history of CAD / coronary stent , his cardiologist is Dr. Jens Somrenshaw.

## 2016-08-25 ENCOUNTER — Ambulatory Visit: Payer: Medicare Other

## 2016-08-26 ENCOUNTER — Other Ambulatory Visit: Payer: Self-pay | Admitting: Cardiology

## 2016-08-28 ENCOUNTER — Ambulatory Visit: Payer: Medicare Other | Admitting: Pharmacist

## 2016-08-28 ENCOUNTER — Ambulatory Visit: Payer: Medicare Other | Admitting: Internal Medicine

## 2016-08-29 ENCOUNTER — Ambulatory Visit: Payer: Medicare Other

## 2016-08-29 ENCOUNTER — Ambulatory Visit: Payer: Medicare Other | Admitting: Pharmacist

## 2016-08-29 ENCOUNTER — Ambulatory Visit: Payer: Medicare Other | Admitting: Internal Medicine

## 2016-09-01 ENCOUNTER — Ambulatory Visit (INDEPENDENT_AMBULATORY_CARE_PROVIDER_SITE_OTHER): Payer: Medicare Other | Admitting: Pharmacist

## 2016-09-01 ENCOUNTER — Ambulatory Visit (INDEPENDENT_AMBULATORY_CARE_PROVIDER_SITE_OTHER): Payer: Medicare Other | Admitting: Internal Medicine

## 2016-09-01 VITALS — BP 144/60 | HR 65 | Temp 97.6°F | Ht 68.0 in | Wt 171.1 lb

## 2016-09-01 DIAGNOSIS — Z7901 Long term (current) use of anticoagulants: Secondary | ICD-10-CM

## 2016-09-01 DIAGNOSIS — I10 Essential (primary) hypertension: Secondary | ICD-10-CM

## 2016-09-01 DIAGNOSIS — I4892 Unspecified atrial flutter: Secondary | ICD-10-CM

## 2016-09-01 DIAGNOSIS — G546 Phantom limb syndrome with pain: Secondary | ICD-10-CM | POA: Diagnosis not present

## 2016-09-01 DIAGNOSIS — Z992 Dependence on renal dialysis: Secondary | ICD-10-CM | POA: Diagnosis not present

## 2016-09-01 DIAGNOSIS — E1122 Type 2 diabetes mellitus with diabetic chronic kidney disease: Secondary | ICD-10-CM | POA: Diagnosis not present

## 2016-09-01 DIAGNOSIS — R0789 Other chest pain: Secondary | ICD-10-CM

## 2016-09-01 DIAGNOSIS — Z87891 Personal history of nicotine dependence: Secondary | ICD-10-CM | POA: Diagnosis not present

## 2016-09-01 DIAGNOSIS — E1151 Type 2 diabetes mellitus with diabetic peripheral angiopathy without gangrene: Secondary | ICD-10-CM

## 2016-09-01 DIAGNOSIS — Z5189 Encounter for other specified aftercare: Secondary | ICD-10-CM | POA: Diagnosis not present

## 2016-09-01 DIAGNOSIS — R197 Diarrhea, unspecified: Secondary | ICD-10-CM

## 2016-09-01 DIAGNOSIS — Z89611 Acquired absence of right leg above knee: Secondary | ICD-10-CM

## 2016-09-01 DIAGNOSIS — I120 Hypertensive chronic kidney disease with stage 5 chronic kidney disease or end stage renal disease: Secondary | ICD-10-CM | POA: Diagnosis not present

## 2016-09-01 DIAGNOSIS — N186 End stage renal disease: Secondary | ICD-10-CM

## 2016-09-01 LAB — POCT INR: INR: 2

## 2016-09-01 NOTE — Patient Instructions (Signed)
Patient instructed to take medications as defined in the Anti-coagulation Track section of this encounter.  Patient instructed to take today's dose.  Patient instructed to take 1 & 1/2 tablets of your 2.5mg  green-colored warfarin tablets by mouth once-daily at 6PM each day--EXCEPT on MONDAYS, WEDNESDAYS and FRIDAYS--take 2 tablets on these days of each week. Patient verbalized understanding of these instructions.

## 2016-09-01 NOTE — Progress Notes (Signed)
CC: Follow up from ED visit for chest pain  HPI:  [name] is a [age] y.o. man who is here today in account of recent ED visit with chest pain and for healthcare maintenance after missing his routine appointment with Dr. Josem [name].   See problem based assessment and plan below for additional details  Past Medical History:  Diagnosis Date  . Arthritis    "fingers" (02/07/2015)  . Atrial flutter (HCC) 07/26/2014   a. May 2016, CHA2DS2VASc = 4 -> Eliquis, spontaneous conversion to NSR;  b. On amio;  c. 01/2015 EPS: Unable to induce right sided Aflutter. Non-sustained Afib and Left sided Aflutter noted.  . Benign prostatic hypertrophy with nocturia 07/07/2006  . Cardiomyopathy (HCC)    a. 07/2014 Echo: EF 50%; b. 03/2015 Ech: EF 20-25%; c. 07/2015 Echo: EF 30-35%, Gr1 DD, sev LVH.  [name]. Chronic venous insufficiency 04/12/2010  . Closed displaced fracture of left femoral neck (HCC) 06/14/2014   s/p left hip hemiarthroplasty June 14, 2014   . Constipation 05/25/2009   Intermittent   . Coronary artery disease 04/02/2006   a. s/p RCA DES (2.5 x 24 mm TAXUS drug-eluting stent) 2006;  b. low risk myoview in 2011; c. 5/17 MV: EF 28%, large inf apical and inflat infarct w/o ischemia.  . Degenerative joint disease involving multiple joints 02/02/2007  . End stage renal disease on dialysis [facility](HCC)    a. TTS Dialysis - currently through right chest diatek, pending AVF.  [name]. GERD (gastroesophageal reflux disease)   . Hepatitis    Hep C  . History of blood transfusion 05/2014   "related to hip OR"  . 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 Hyperlipidemia 04/02/2006  . Hypertension   . Hypertensive heart disease 12/09/2006  . Hypothyroidism   . Internal and external hemorrhoids without complication 08/20/2012  . Microcytic normochromic anemia 05/27/2006  . Obesity (BMI 30.0-34.9) 01/15/2012  . Phantom limb pain (HCC) 06/13/2016  . Type 2 diabetes mellitus with both eyes affected by  severe nonproliferative retinopathy without macular edema, without long-term current use of insulin (HCC) 03/03/2016  . 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 Type 2 diabetes mellitus with peripheral artery disease (HCC) 05/27/2006   Absent pulses in the left foot   . Type 2 diabetes mellitus with stage 5 chronic kidney disease (HCC) 08/06/2012    Review of Systems:  Review of Systems  Constitutional: Negative for chills and fever.  Respiratory: Negative for shortness of breath.   Cardiovascular: Positive for chest pain.  Gastrointestinal: Negative for nausea and vomiting.  Skin: Positive for rash.    Physical Exam: Physical Exam  Constitutional: He is well-developed, well-nourished, and in no distress.  Cardiovascular: Normal rate and regular rhythm.   Pulmonary/Chest: Effort normal and breath sounds normal.  Musculoskeletal:  Right leg prosthetic in place Erythematous rash on right forearm with linear margins over AVF AVF +thrill    Vitals:   09/01/16 1029  BP: (!) 144/60  Pulse: 65  Temp: 97.6 F (36.4 C)  TempSrc: Oral  SpO2: 96%  Weight: 171 lb 1.6 oz (77.6 kg)  Height: 5\' 8"  (1.727 m)     Assessment & Plan:   See Encounters Tab for problem based charting.  Patient discussed with Dr. Lynnae January

## 2016-09-01 NOTE — Patient Instructions (Signed)
It was a pleasure to see you today [name] [name].  I am glad you are doing so well today.  I would like you to check your blood sugars at home several times before you come back to see Dr. Josem KaufmannKlima again. This is best to check before mealtimes especially breakfast and dinner.

## 2016-09-01 NOTE — Progress Notes (Signed)
Anticoagulation Management [name] [name] is a 71 y.o. male who reports to the clinic for monitoring of warfarin treatment.    Indication: atrial fibrillation   Duration: indefinite Supervising physician: Blanch MediaElizabeth Butcher  Anticoagulation Clinic Visit History: Patient does not report signs/symptoms of bleeding or thromboembolism or dizziness or racing heart. Other recent changes: None. Anticoagulation Episode Summary    Current INR goal:   2.0-3.0  TTR:   46.6 % (1.2 y)  Next INR check:   09/29/2016  INR from last check:   2.00 (09/01/2016)  Weekly max warfarin dose:     Target end date:     INR check location:     Preferred lab:     Send INR reminders to:      Indications   Paroxysmal atrial flutter (HCC) [I48.92] Long term (current) use of anticoagulants [Z79.01] [Z79.01]       Comments:          ASSESSMENT Recent Results: The most recent result is correlated with 28.75 mg per week: Lab Results  Component Value Date   INR 2.00 09/01/2016   INR 2.30 08/20/2016   INR 2.20 07/28/2016    Anticoagulation Dosing: INR as of 09/01/2016 and Previous Warfarin Dosing Information    INR Dt INR Goal Wkly Tot Sun Mon Tue Wed Thu Fri Sat   09/01/2016 2.00 2.0-3.0 28.75 mg 3.75 mg 5 mg 3.75 mg 3.75 mg 5 mg 3.75 mg 3.75 mg    Previous description   Take 1 & 1/2 tablets of your 2.5mg  green-colored warfarin tablets by mouth once-daily at 6PM each day--EXCEPT on MONDAYS and THURSDAYS--take 2 tablets on these days of each week.     Anticoagulation Warfarin Dose Instructions as of 09/01/2016      Total Sun Mon Tue Wed Thu Fri Sat   New Dose 30 mg 3.75 mg 5 mg 3.75 mg 5 mg 3.75 mg 5 mg 3.75 mg     (2.5 mg x 1.5)  (2.5 mg x 2)  (2.5 mg x 1.5)  (2.5 mg x 2)  (2.5 mg x 1.5)  (2.5 mg x 2)  (2.5 mg x 1.5)                         Description   Take 1 & 1/2 tablets of your 2.5mg  green-colored warfarin tablets by mouth once-daily at 6PM each day--EXCEPT on MONDAYS, WEDNESDAYS and  FRIDAYS--take 2 tablets on these days of each week.       INR today: Therapeutic  PLAN Weekly dose was increased by 10% to 30 mg per week  There are no Patient Instructions on file for this visit. Patient advised to contact clinic or seek medical attention if signs/symptoms of bleeding or thromboembolism occur.  Patient verbalized understanding by repeating back information and was advised to contact me if further medication-related questions arise. Patient was also provided an information handout.  Follow-up Return in 4 weeks (on 09/29/2016) for Follow up INR at 1000h.  Janie MorningGroce III, Ricke HeyJames Boyd PharmD, CACP Professor of Pharmacy, Clinical Pharmacy Specialist-Anticoagulation, [place]  15 minutes spent face-to-face with the patient during the encounter. 50% of time spent on education. 50% of time was spent on finger-stick point of care specimen collection, resulting, interpretation and data-entry.

## 2016-09-03 NOTE — Assessment & Plan Note (Addendum)
His stump pain is actually improved today compared to earlier this year. He is only taking the gabapentin some days and denies any associated drowsiness. Overall this problem appears to be somewhat improved and responsive to the current treatment plan.  Plan: Continue with gabapentin 300mg  daily PRN

## 2016-09-03 NOTE — Assessment & Plan Note (Signed)
Assessment: He does not have a blood glucose log with him today for review. On a limited review he denies obvious symptoms of hyperglycemia but there could be mild or moderate elevations. He has a previously normal Hgb A1c although interpretation is limited due to his ESRD.  Plan: I encouraged him to keep working on his diet for the management of his diabetes. I asked him and his wife to try checking home CBGs at least daily, ideally before breakfast and before dinner sometimes, for the 2 weeks preceding his next clinic visit with Dr. Josem KaufmannKlima.

## 2016-09-03 NOTE — Assessment & Plan Note (Signed)
Assessment: His blood pressure is mildly elevated today at 144/60. He does not have advanced heart failure or evidence of symptoms due to hypertension. This is less elevated than his previous visit. His risk of complications with tight control is higher due to age and intradialysis blood pressure decreases.  Plan: I think this is an acceptable control today. We can continue the Coreg 6.25mg  BID alone for now. If it remains high or worsening he might need titration in a future visit.

## 2016-09-03 NOTE — Assessment & Plan Note (Signed)
HPI: He went to the ED recently for retrosternal chest pain that worsened after eating but failed to improve with waiting at home. There was no associated shortness of breath or diaphoresis. His symptoms improved spontaneously at the ED and did not recur. He has not suffered similar episodes before this and is tolerating dialysis and routine daily activities with no trouble. He thinks it may have been from indigestion and gas.  A: Atypical retrosternal chest pain that is resolved at this time  P: No new interventions today Recommended he call us back or the ED if he has severe chest pain again and warned of red flag symptoms

## 2016-09-05 NOTE — Progress Notes (Signed)
Internal Medicine Clinic Attending  Case discussed with Dr. Rice at the time of the visit.  We reviewed the resident's history and exam and pertinent patient test results.  I agree with the assessment, diagnosis, and plan of care documented in the resident's note.  

## 2016-09-17 ENCOUNTER — Other Ambulatory Visit: Payer: Self-pay | Admitting: Internal Medicine

## 2016-09-17 DIAGNOSIS — E039 Hypothyroidism, unspecified: Secondary | ICD-10-CM

## 2016-09-23 ENCOUNTER — Telehealth: Payer: Self-pay | Admitting: *Deleted

## 2016-09-23 ENCOUNTER — Ambulatory Visit (INDEPENDENT_AMBULATORY_CARE_PROVIDER_SITE_OTHER): Payer: Medicare Other | Admitting: Internal Medicine

## 2016-09-23 VITALS — BP 190/78 | HR 68 | Temp 98.4°F | Wt 179.9 lb

## 2016-09-23 DIAGNOSIS — N186 End stage renal disease: Secondary | ICD-10-CM

## 2016-09-23 DIAGNOSIS — Z992 Dependence on renal dialysis: Secondary | ICD-10-CM | POA: Diagnosis not present

## 2016-09-23 DIAGNOSIS — Z9115 Patient's noncompliance with renal dialysis: Secondary | ICD-10-CM | POA: Diagnosis not present

## 2016-09-23 DIAGNOSIS — Z89611 Acquired absence of right leg above knee: Secondary | ICD-10-CM | POA: Diagnosis not present

## 2016-09-23 LAB — BASIC METABOLIC PANEL
ANION GAP: 9 (ref 5–15)
BUN: 65 mg/dL — ABNORMAL HIGH (ref 6–20)
CHLORIDE: 110 mmol/L (ref 101–111)
CO2: 22 mmol/L (ref 22–32)
Calcium: 8.5 mg/dL — ABNORMAL LOW (ref 8.9–10.3)
Creatinine, Ser: 7.11 mg/dL — ABNORMAL HIGH (ref 0.61–1.24)
GFR calc Af Amer: 8 mL/min — ABNORMAL LOW (ref >60)
GFR, EST NON AFRICAN AMERICAN: 7 mL/min — AB (ref >60)
GLUCOSE: 169 mg/dL — AB (ref 65–99)
POTASSIUM: 4.4 mmol/L (ref 3.5–5.1)
Sodium: 141 mmol/L (ref 135–145)

## 2016-09-23 NOTE — Assessment & Plan Note (Addendum)
ESRD  Assessment: He is missing his 4th HD session today due to personality conflicts with dialysis center staff and sessions running late. With counseling and encouragement from his wife, he has agreed to return to dialysis at his scheduled Thursday session if the process of switching centers is started. I have contacted the nurse manager, Adam, for the dialysis center [facility], [address] location, [phone]) to express Mr. Awtrey's concerns on his behalf. Adam remarked that switching centers may be a longer process than Mr. Cassarino is expecting. He will also pass these concerns to Mr. Byington's Child psychotherapistsocial worker who may be able to facilitate any center switch. I have advised Mr. Walston to request to speak with his social worker when he goes for HD this Thursday 7/5. BP is elevated today, BMP in the clinic revealed K 4.4, HCO3 22, BUN 65- no indications for emergent dialysis or admission.   Plan: -Attend HD session 7/5, express concerns to social worker at that time  -Strict return precautions given  -F/u 2 weeks with PCP (has missed previous appts) to assess dialysis issues and chronic conditions

## 2016-09-23 NOTE — Patient Instructions (Addendum)
Thank you for coming in [name] [name]. We understand your frustrations with dialysis and hope we can help start to get this resolved. I spoke with a Production designer, theatre/television/filmmanager at the dialysis center. Unfortunately, your social worker was unavailable to talk, but he assured me he would let your social worker know of your issues and see if she can begin the process of changing centers. However, this may take some time, so please try to remain patient and let us know if you have any other problems.   If you become short of breath, confused, nauseous, have chest pain or become suddenly ill, please go to the Emergency Department for emergent dialysis.

## 2016-09-23 NOTE — Telephone Encounter (Signed)
Pt's wife comes in upset and worried, pt is in car, she states he has just missed his 4th visit to dialysis and is refusing to go back. Some of his reasons for not returning might be some issues at the dialysis clinic, such as keeping him from making his bus trip home on time, not making sure his site has clotted and sending him back to a surgeon stating his access site is not functioning properly when it was, these are shared by his wife. Possibly if he is told he may go to another center he may agree to go back. She states the fluid is really building up and she is worried appt [id number] [facility] per dr Mikey Bussinghoffman

## 2016-09-23 NOTE — Progress Notes (Signed)
CC: Not going to dialysis  HPI:  [name] [name] is a 71 y.o. M with past medical history as detailed below who is brought in by his wife today because he has been refusing to go to dialysis.   Mr. Jethro [name] notes various complaints with the dialysis center and staff including personality issues with two staff members, dialysis sessions running late which cause him to miss his transportation home, and potential inexperienced staff having trouble with fistula access in the past. Because of this frustration, he has missed 4 consecutive dialysis sessions (including today) of his TTS schedule. He states he is feeling fine with minimal signs of increased volume, denies SOB, confusion, CP, bleeding. He states he does not want to go back to the dialysis center and would rather switch centers or not go.    Past Medical History:  Diagnosis Date  . Arthritis    "fingers" (02/07/2015)  . Atrial flutter (HCC) 07/26/2014   a. May 2016, CHA2DS2VASc = 4 -> Eliquis, spontaneous conversion to NSR;  b. On amio;  c. 01/2015 EPS: Unable to induce right sided Aflutter. Non-sustained Afib and Left sided Aflutter noted.  . Benign prostatic hypertrophy with nocturia 07/07/2006  . Cardiomyopathy (HCC)    a. 07/2014 Echo: EF 50%; b. 03/2015 Ech: EF 20-25%; c. 07/2015 Echo: EF 30-35%, Gr1 DD, sev LVH.  [name] [name]. Chronic venous insufficiency 04/12/2010  . Closed displaced fracture of left femoral neck (HCC) 06/14/2014   s/p left hip hemiarthroplasty June 14, 2014   . Constipation 05/25/2009   Intermittent   . Coronary artery disease 04/02/2006   a. s/p RCA DES (2.5 x 24 mm TAXUS drug-eluting stent) 2006;  b. low risk myoview in 2011; c. 5/17 MV: EF 28%, large inf apical and inflat infarct w/o ischemia.  . Degenerative joint disease involving multiple joints 02/02/2007  . End stage renal disease on dialysis [facility](HCC)    a. TTS Dialysis - currently through right chest diatek, pending AVF.  [name] [name]. GERD (gastroesophageal reflux disease)     . Hepatitis    Hep C  . History of blood transfusion 05/2014   "related to hip OR"  . 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 Hyperlipidemia 04/02/2006  . Hypertension   . Hypertensive heart disease 12/09/2006  . Hypothyroidism   . Internal and external hemorrhoids without complication 08/20/2012  . Microcytic normochromic anemia 05/27/2006  . Obesity (BMI 30.0-34.9) 01/15/2012  . Phantom limb pain (HCC) 06/13/2016  . 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 Type 2 diabetes mellitus with peripheral artery disease (HCC) 05/27/2006   Absent pulses in the left foot   . 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  Respiratory: Negative for shortness of breath.   Cardiovascular: Negative for chest pain and claudication.  Gastrointestinal: Negative for abdominal pain, nausea and vomiting.  Musculoskeletal: Negative for myalgias.  Endo/Heme/Allergies: Does not bruise/bleed easily.     Physical Exam:  Vitals:   09/23/16 1505  BP: (!) 190/78  Pulse: 68  Temp: 98.4 F (36.9 C)  TempSrc: Oral  SpO2: 100%  Weight: 179 lb 14.4 oz (81.6 kg)   Physical Exam  Constitutional: He appears well-developed.  Cardiovascular: Normal rate.   RUE AVF with palpable thrill, minimal edema of R hand and LLE  Pulmonary/Chest: Effort normal and breath sounds normal.  No crackles   Abdominal: Soft. There is no tenderness.  Musculoskeletal:  Right AKA amputation   Skin: Skin is warm and dry.    Assessment & Plan:   See Encounters Tab for problem based charting.  Patient seen with Dr. Cleda Daub

## 2016-09-28 NOTE — Progress Notes (Signed)
Internal Medicine Clinic Attending  I saw and evaluated the patient.  I personally confirmed the key portions of the history and exam documented by Dr. Harden and I reviewed pertinent patient test results.  The assessment, diagnosis, and plan were formulated together and I agree with the documentation in the resident's note.  

## 2016-09-29 ENCOUNTER — Ambulatory Visit (INDEPENDENT_AMBULATORY_CARE_PROVIDER_SITE_OTHER): Payer: Medicare Other | Admitting: Pharmacist

## 2016-09-29 DIAGNOSIS — Z7901 Long term (current) use of anticoagulants: Secondary | ICD-10-CM | POA: Diagnosis not present

## 2016-09-29 DIAGNOSIS — I4892 Unspecified atrial flutter: Secondary | ICD-10-CM | POA: Diagnosis not present

## 2016-09-29 LAB — POCT INR: INR: 2

## 2016-09-29 NOTE — Patient Instructions (Signed)
Patient instructed to take medications as defined in the Anti-coagulation Track section of this encounter.  Patient instructed to take today's dose.  Patient instructed to take  1 & 1/2 tablets of your 2.5mg  green-colored warfarin tablets by mouth once-daily at [facility]6PM on Saturdays and Sundays; ALL OTHER DAYS, take 2 tablets on these days of each week. Patient verbalized understanding of these instructions.

## 2016-09-29 NOTE — Progress Notes (Signed)
Anticoagulation Management [name] [name] is a 71 y.o. male who reports to the clinic for monitoring of warfarin treatment.    Indication: atrial fibrillation ; paroxsysmal atrial flutter with long term use of anticoagulants Duration: indefinite Supervising physician: Earl Lagos  Anticoagulation Clinic Visit History: Patient does not report signs/symptoms of bleeding or thromboembolism  Other recent changes: No diet, medications, lifestyle changes endorsed by the patient. Anticoagulation Episode Summary    Current INR goal:   2.0-3.0  TTR:   49.9 % (1.2 y)  Next INR check:   10/27/2016  INR from last check:   2.00 (09/29/2016)  Weekly max warfarin dose:     Target end date:     INR check location:     Preferred lab:     Send INR reminders to:      Indications   Paroxysmal atrial flutter (HCC) [I48.92] Long term (current) use of anticoagulants [Z79.01] [Z79.01]       Comments:          ASSESSMENT Recent Results: The most recent result is correlated with 30 mg per week: Lab Results  Component Value Date   INR 2.00 09/29/2016   INR 2.00 09/01/2016   INR 2.30 08/20/2016    Anticoagulation Dosing: INR as of 09/29/2016 and Previous Warfarin Dosing Information    INR Dt INR Goal Wkly Tot Sun Mon Tue Wed Thu Fri Sat   09/29/2016 2.00 2.0-3.0 30 mg 3.75 mg 5 mg 3.75 mg 5 mg 3.75 mg 5 mg 3.75 mg    Previous description   Take 1 & 1/2 tablets of your 2.5mg  green-colored warfarin tablets by mouth once-daily at 6PM each day--EXCEPT on MONDAYS, WEDNESDAYS and FRIDAYS--take 2 tablets on these days of each week.     Anticoagulation Warfarin Dose Instructions as of 09/29/2016      Total Sun Mon Tue Wed Thu Fri Sat   New Dose 32.5 mg 3.75 mg 5 mg 5 mg 5 mg 5 mg 5 mg 3.75 mg     (2.5 mg x 1.5)  (2.5 mg x 2)  (2.5 mg x 2)  (2.5 mg x 2)  (2.5 mg x 2)  (2.5 mg x 2)  (2.5 mg x 1.5)                         Description   Take 1 & 1/2 tablets of your 2.5mg  green-colored warfarin  tablets by mouth once-daily at [facility] on Saturdays and Sundays; ALL OTHER DAYS, take 2 tablets on these days of each week.       INR today: Therapeutic  PLAN Weekly dose was increased by 9% to 32.5 mg per week  Patient Instructions  Patient instructed to take medications as defined in the Anti-coagulation Track section of this encounter.  Patient instructed to take today's dose.  Patient instructed to take  1 & 1/2 tablets of your 2.5mg  green-colored warfarin tablets by mouth once-daily at [facility] on Saturdays and Sundays; ALL OTHER DAYS, take 2 tablets on these days of each week. Patient verbalized understanding of these instructions.     Patient advised to contact clinic or seek medical attention if signs/symptoms of bleeding or thromboembolism occur.  Patient verbalized understanding by repeating back information and was advised to contact me if further medication-related questions arise. Patient was also provided an information handout.  Follow-up Return in 4 weeks (on 10/27/2016) for Follow up INR at 1000h.  Gar Ponto  PharmD, CACP,  CPP  15 minutes spent face-to-face with the patient during the encounter. 50% of time spent on education. 50% of time was spent on fingerstick point of care INR sample collection, processing, interpretation and data-entry into EPIC/CHL and [email or web address].

## 2016-09-30 NOTE — Progress Notes (Signed)
INTERNAL MEDICINE TEACHING ATTENDING ADDENDUM - Daylan Juhnke M.D  Duration- indefinite, Indication- paroxysmal a. flutter, INR- therapeutic. Agree with pharmacy recommendations as outlined in their note.

## 2016-10-20 ENCOUNTER — Ambulatory Visit (INDEPENDENT_AMBULATORY_CARE_PROVIDER_SITE_OTHER): Payer: Medicare Other | Admitting: Internal Medicine

## 2016-10-20 ENCOUNTER — Encounter: Payer: Self-pay | Admitting: Internal Medicine

## 2016-10-20 DIAGNOSIS — Z87891 Personal history of nicotine dependence: Secondary | ICD-10-CM

## 2016-10-20 DIAGNOSIS — R109 Unspecified abdominal pain: Secondary | ICD-10-CM | POA: Insufficient documentation

## 2016-10-20 NOTE — Progress Notes (Signed)
CC: Right sided pain  HPI:  [name] [name] is a 71 y.o. with history noted below that presents to the acute care clinic for 1.5 week history of right-sided pain.  Patient cannot recall what he was doing when he first noticed the pain. He states that the pain is worse with movement especially when he turns from side to side. He denies abdominal pain, flank pain, nausea/vomiting or fever/chills.  He has tried tylenol with some benefit.  Patient states that the pain is stable and has not gotten worse in the past week and a half.  Patient does report being more active recently with gardening and moving large pots.  At baseline patient uses an electric wheel chair as he has a right leg protheses.    Past Medical History:  Diagnosis Date  . Arthritis    "fingers" (02/07/2015)  . Atrial flutter (HCC) 07/26/2014   a. May 2016, CHA2DS2VASc = 4 -> Eliquis, spontaneous conversion to NSR;  b. On amio;  c. 01/2015 EPS: Unable to induce right sided Aflutter. Non-sustained Afib and Left sided Aflutter noted.  . Benign prostatic hypertrophy with nocturia 07/07/2006  . Cardiomyopathy (HCC)    a. 07/2014 Echo: EF 50%; b. 03/2015 Ech: EF 20-25%; c. 07/2015 Echo: EF 30-35%, Gr1 DD, sev LVH.  [name]. Chronic venous insufficiency 04/12/2010  . Closed displaced fracture of left femoral neck (HCC) 06/14/2014   s/p left hip hemiarthroplasty June 14, 2014   . Constipation 05/25/2009   Intermittent   . Coronary artery disease 04/02/2006   a. s/p RCA DES (2.5 x 24 mm TAXUS drug-eluting stent) 2006;  b. low risk myoview in 2011; c. 5/17 MV: EF 28%, large inf apical and inflat infarct w/o ischemia.  . Degenerative joint disease involving multiple joints 02/02/2007  . End stage renal disease on dialysis [facility](HCC)    a. TTS Dialysis - currently through right chest diatek, pending AVF.  [name]. GERD (gastroesophageal reflux disease)   . Hepatitis    Hep C  . History of blood transfusion 05/2014   "related to hip OR"  . Hx of AKA (above  knee amputation), right (HCC) 06/12/2016  . Hyperlipidemia 04/02/2006  . Hypertension   . Hypertensive heart disease 12/09/2006  . Hypothyroidism   . Internal and external hemorrhoids without complication 08/20/2012  . Microcytic normochromic anemia 05/27/2006  . Obesity (BMI 30.0-34.9) 01/15/2012  . Phantom limb pain (HCC) 06/13/2016  . 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 Type 2 diabetes mellitus with peripheral artery disease (HCC) 05/27/2006   Absent pulses in the left foot   . Type 2 diabetes mellitus with stage 5 chronic kidney disease (HCC) 08/06/2012    Review of Systems:  As noted per HPI  Physical Exam:  Vitals:   10/20/16 1513  BP: (!) 164/72  Pulse: (!) 58  Temp: 98.2 F (36.8 C)  TempSrc: Oral  SpO2: 100%  Weight: 176 lb (79.8 kg)  Height: 5\' 8"  (1.727 m)   Physical Exam  Constitutional: He is well-developed, well-nourished, and in no distress.  Cardiovascular: Normal rate, regular rhythm and normal heart sounds.  Exam reveals  no gallop and no friction rub.   No murmur heard. Pulmonary/Chest: Effort normal and breath sounds normal. No respiratory distress. He has no wheezes. He has no rales.  Abdominal: Soft. He exhibits no distension and no mass. There is no tenderness. There is no rebound and no guarding.  Genitourinary:  Genitourinary Comments: No flank tenderness  Skin: No rash noted. No erythema.     Assessment & Plan:   See encounters tab for problem based medical decision making.   Patient discussed with Dr. Oswaldo DoneVincent

## 2016-10-20 NOTE — Patient Instructions (Signed)
[name],  It was a pleasure meeting you today. Please continue to use Tylenol and heating pads for your right side muscle pain. If your symptoms do not improve or worsen please follow up in the acute care clinic.

## 2016-10-20 NOTE — Assessment & Plan Note (Signed)
Assessment: Right-sided muscular strain Patient reports a 1.5 week history of right sided pain that is associated with movement.  On exam there is tenderness to the right sided musculature with tightness noted. No rashes are noted.  Patient leads a relatively sedentary life and has been more active recently.  I suspect patient had a muscle strain that is causing his pain. I recommended continuing Tylenol and using heating pads as needed. Told patient to return if pain does not improve or worsens.  Plan -tylenol - heating pad - return precautions given

## 2016-10-21 ENCOUNTER — Encounter: Payer: Self-pay | Admitting: Family

## 2016-10-21 ENCOUNTER — Encounter: Payer: Self-pay | Admitting: Vascular Surgery

## 2016-10-21 NOTE — Progress Notes (Signed)
Internal Medicine Clinic Attending  Case discussed with Dr. Hoffman at the time of the visit.  We reviewed the resident's history and exam and pertinent patient test results.  I agree with the assessment, diagnosis, and plan of care documented in the resident's note.  

## 2016-10-22 ENCOUNTER — Encounter: Payer: Self-pay | Admitting: Vascular Surgery

## 2016-10-22 ENCOUNTER — Ambulatory Visit (INDEPENDENT_AMBULATORY_CARE_PROVIDER_SITE_OTHER): Payer: Medicare Other | Admitting: Vascular Surgery

## 2016-10-22 VITALS — BP 147/63 | HR 59 | Temp 97.0°F | Resp 18 | Ht 68.0 in | Wt 172.0 lb

## 2016-10-22 DIAGNOSIS — N186 End stage renal disease: Secondary | ICD-10-CM

## 2016-10-22 DIAGNOSIS — Z992 Dependence on renal dialysis: Secondary | ICD-10-CM

## 2016-10-22 NOTE — Progress Notes (Signed)
Patient ID: [name] [name], male   DOB: [birth year], 71 y.o.   MRN: [id number]  Reason for Consult: Follow-up (referred by Dr. Allena Katz  rt bcf with innominate to svc cto lesion   dialyzes T/TH/ Sat.)   Referred by Doneen Poisson, MD  Subjective:     HPI:  [name] [name] is a 71 y.o. male with history end-stage renal disease on dialysis Tuesday Thursday Saturday via right arm brachiocephalic AV fistula. He recently has had issues on dialysis and underwent fistulogram. Dr. Allena Katz which demonstrated a innominate and SVC stenosis/occlusion. He now presents for possible intervention of this lesion. He does not have right arm swelling at the time does have pulsatility in his fistula. He is on Coumadin. He is not having any other issues at this time.  Past Medical History:  Diagnosis Date  . Arthritis    "fingers" (02/07/2015)  . Atrial flutter (HCC) 07/26/2014   a. May 2016, CHA2DS2VASc = 4 -> Eliquis, spontaneous conversion to NSR;  b. On amio;  c. 01/2015 EPS: Unable to induce right sided Aflutter. Non-sustained Afib and Left sided Aflutter noted.  . Benign prostatic hypertrophy with nocturia 07/07/2006  . Cardiomyopathy (HCC)    a. 07/2014 Echo: EF 50%; b. 03/2015 Ech: EF 20-25%; c. 07/2015 Echo: EF 30-35%, Gr1 DD, sev LVH.  [name] Chronic venous insufficiency 04/12/2010  . Closed displaced fracture of left femoral neck (HCC) 06/14/2014   s/p left hip hemiarthroplasty June 14, 2014   . Constipation 05/25/2009   Intermittent   . Coronary artery disease 04/02/2006   a. s/p RCA DES (2.5 x 24 mm TAXUS drug-eluting stent) 2006;  b. low risk myoview in 2011; c. 5/17 MV: EF 28%, large inf apical and inflat infarct w/o ischemia.  . Degenerative joint disease involving multiple joints 02/02/2007  . End stage renal disease on dialysis [facility])    a. TTS Dialysis - currently through right chest diatek, pending AVF.  [name] GERD (gastroesophageal reflux disease)   . Hepatitis    Hep C  . History of blood transfusion  05/2014   "related to hip OR"  . 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 Hyperlipidemia 04/02/2006  . Hypertension   . Hypertensive heart disease 12/09/2006  . Hypothyroidism   . Internal and external hemorrhoids without complication 08/20/2012  . Microcytic normochromic anemia 05/27/2006  . Obesity (BMI 30.0-34.9) 01/15/2012  . Phantom limb pain (HCC) 06/13/2016  . 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 Type 2 diabetes mellitus with peripheral artery disease (HCC) 05/27/2006   Absent pulses in the left foot   . 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 Hypertension Mother   . Heart attack Father   . Breast cancer Sister   . Arthritis Sister   . Heart attack Brother   . Diabetes Brother   . Stroke Brother   . Pneumonia Daughter   . Diabetes Brother   . Alcoholism Brother   . Diabetes Brother   . Arthritis Sister  Bilateral knee replacement  . HIV Daughter   . Hypertension Daughter   . Drug abuse Daughter   . Schizophrenia Daughter   . Hypertension Daughter   . Drug abuse Daughter   . Bipolar disorder Daughter    Past Surgical History:  Procedure Laterality Date  . AV FISTULA PLACEMENT Left 10/08/2015   Procedure:  BRACHIOCEPHALIC ARTERIOVENOUS (AV) FISTULA CREATION Right Arm;  Surgeon: Fransisco HertzBrian L Chen, MD;  [facility];  Service: Vascular;  Laterality: Left;  . CARDIAC CATHETERIZATION N/A 09/21/2015   Procedure: Left Heart Cath and Coronary Angiography;  Surgeon: Marykay Lexavid W  Harding, MD;  [facility];  Service: Cardiovascular;  Laterality: N/A;  . CHOLECYSTECTOMY N/A 12/12/2014   Procedure: LAPAROSCOPIC CHOLECYSTECTOMY WITH INTRAOPERATIVE CHOLANGIOGRAM;  Surgeon: Gaynelle AduEric Wilson, MD;  Location: [facility] OR;  Service: General;  Laterality: N/A;  . CORONARY ANGIOPLASTY WITH STENT PLACEMENT    . ELECTROPHYSIOLOGIC STUDY N/A 02/07/2015   Procedure: A-Flutter Ablation;  Surgeon: Marinus MawGregg W Taylor, MD;  [facility];  Service: Cardiovascular;  Laterality: N/A;  . FRACTURE SURGERY    . INSERTION OF DIALYSIS CATHETER Right 06/13/2015   Procedure: INSERTION OF DIALYSIS CATHETER RIGHT INTERNAL JUGULAR;  Surgeon: Larina Earthlyodd F Early, MD;  [facility];  Service: Vascular;  Laterality: Right;  . JOINT REPLACEMENT    . LEG AMPUTATION ABOVE KNEE Right ~ 2008  . PILONIDAL CYST EXCISION  1990's  . PROSTATE BIOPSY  ~ 2013  . REFRACTIVE SURGERY Bilateral   . REMOVAL OF A DIALYSIS CATHETER Right 06/13/2015   Procedure: REMOVAL OF A DIALYSIS CATHETER;  Surgeon: Larina Earthlyodd F Early, MD;  [facility];  Service: Vascular;  Laterality: Right;  . TONSILLECTOMY    . TOTAL HIP ARTHROPLASTY Left 06/14/2014   Procedure: HEMI HIP ARTHROPLASTY ANTERIOR APPROACH;  Surgeon: Tarry KosNaiping M Xu, MD;  Location: [facility] OR;  Service: Orthopedics;  Laterality: Left;    Short Social History:  Social History  Substance Use Topics  . Smoking status: Former Smoker    Packs/day: 1.00    Years: 10.00    Types: Cigarettes    Quit date: 06/16/1968  . Smokeless tobacco: Never Used  . Alcohol use No     Comment: "quit alcohol in the 1960's"    Allergies  Allergen Reactions  . Lisinopril Other (See Comments)    Acute kidney injury  . Amoxicillin Swelling and Other (See Comments)    Puffy eyes and abdominal pain with Amoxicillin PO  . Pravastatin Other (See Comments)    Weakness and fatigue  . Tamsulosin Other (See Comments)    dry throat, sweating, blurred vision  . Doxycycline Rash and Other  (See Comments)    Questionable drug rxn rash    Current Outpatient Prescriptions  Medication Sig Dispense Refill  . amiodarone (PACERONE) 200 MG tablet Take 1 tablet (200 mg total) by mouth daily. 90 tablet 3  . calcium citrate-vitamin D (CITRACAL+D) 315-200 MG-UNIT per tablet Take 1 tablet by mouth daily.     . carvedilol (COREG) 6.25 MG tablet Take 1 tablet (6.25 mg total) by mouth 2 (two) times daily. 180 tablet 3  . famotidine (PEPCID) 20 MG tablet TAKE ONE TABLET BY MOUTH BEFORE BREAKFAST AND ONE TABLET BEFORE SUPPER 60 tablet 2  . fish oil-omega-3 fatty acids 1000 MG capsule Take 1 g by mouth daily.    . fluticasone (FLONASE) 50 MCG/ACT nasal spray Place 1 spray into both nostrils daily as needed for allergies. 16 g  3  . gabapentin (NEURONTIN) 300 MG capsule Take 1 capsule (300 mg total) by mouth daily as needed (for stump pain). 30 capsule 11  . hydroxypropyl methylcellulose (ISOPTO TEARS) 2.5 % ophthalmic solution Place 2 drops into both eyes daily as needed for dry eyes. Reported on 04/20/2015    . levothyroxine (SYNTHROID, LEVOTHROID) 25 MCG tablet Take 1 tablet (25 mcg total) by mouth daily before breakfast. 90 tablet 3  . Multiple Vitamins-Minerals (MULTIVITAMIN WITH MINERALS) tablet Take 1 tablet by mouth daily.    . ondansetron (ZOFRAN) 4 MG tablet Take 1 tablet (4 mg total) by mouth every 8 (eight) hours as needed for nausea. 30 tablet 11  . warfarin (COUMADIN) 2.5 MG tablet Take 1 & 1/2 tablets all days of week--EXCEPT on Mondays and Thursdays, take TWO (2) tablets on Mondays and Thursdays. 46 tablet 2  . atorvastatin (LIPITOR) 40 MG tablet Take 1 tablet (40 mg total) by mouth daily. 90 tablet 3   No current facility-administered medications for this visit.     Review of Systems  Constitutional:  Constitutional negative. HENT: HENT negative.  Eyes: Eyes negative.  Respiratory: Respiratory negative.  Cardiovascular: Cardiovascular negative.  GI: Gastrointestinal negative.    Musculoskeletal: Musculoskeletal negative.  Skin: Skin negative.  Neurological: Neurological negative. Hematologic: Hematologic/lymphatic negative.  Psychiatric: Psychiatric negative.        Objective:  Objective   Vitals:   10/22/16 1231  BP: (!) 147/63  Pulse: (!) 59  Resp: 18  Temp: (!) 97 F (36.1 C)  TempSrc: Oral  SpO2: 99%  Weight: 172 lb (78 kg)  Height: 5\' 8"  (1.727 m)   Body mass index is 26.15 kg/m.  Physical Exam  Constitutional: He is oriented to person, place, and time. He appears well-developed.  HENT:  Head: Normocephalic.  Eyes: Pupils are equal, round, and reactive to light.  Neck: Normal range of motion.  Cardiovascular:  Palpable right arm avf with pulsatility in upper arm 2+ right radial pulse  Abdominal: Soft.  Musculoskeletal:  RLE prosthesis   Neurological: He is alert and oriented to person, place, and time.  Skin: Skin is warm and dry.  Psychiatric: He has a normal mood and affect. His behavior is normal. Judgment and thought content normal.    Data: I reviewed Dr. Eliane DecreePatel's images today which demonstrated a right innominate vein and SVC stenosis/occlusion.     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 From the standpoint we will proceed with repeat right upper showing fistulogram and right femoral access. He will need possible PTA possible stenting and we will probably use IVIS. He demonstrates good understanding we will get him scheduled for nondialysis day in the very near future. He will Coumadin for 3 days prior to the procedure.     Maeola HarmanBrandon Christopher Skyelar Swigart MD [facility]

## 2016-10-27 ENCOUNTER — Encounter: Payer: Self-pay | Admitting: Nephrology

## 2016-10-27 ENCOUNTER — Ambulatory Visit (INDEPENDENT_AMBULATORY_CARE_PROVIDER_SITE_OTHER): Payer: Medicare Other | Admitting: Pharmacist

## 2016-10-27 DIAGNOSIS — Z7901 Long term (current) use of anticoagulants: Secondary | ICD-10-CM | POA: Diagnosis not present

## 2016-10-27 DIAGNOSIS — Z5181 Encounter for therapeutic drug level monitoring: Secondary | ICD-10-CM | POA: Diagnosis not present

## 2016-10-27 DIAGNOSIS — I4892 Unspecified atrial flutter: Secondary | ICD-10-CM | POA: Diagnosis not present

## 2016-10-27 LAB — POCT INR: INR: 2

## 2016-10-27 NOTE — Progress Notes (Signed)
Take  Anticoagulation Management [name] [name] is a 71 y.o. male  who reports to the clinic for monitoring of warfarin treatment.    Indication: atrial fibrillation ; paroxysmal atrial flutter; long term use of anticoagulation.   Duration:  Indefinite Supervising physician: Debe CoderEmily Mullen  Anticoagulation Clinic Visit History: Patient does not report signs/symptoms of bleeding or thromboembolism. Other recent changes: No diet, medications, lifestyle changes endorsed by the patient.  Anticoagulation Episode Summary    Current INR goal:   2.0-3.0  TTR:   52.8 % (1.3 y)  Next INR check:   11/10/2016  INR from last check:   2.00 (09/29/2016)  Weekly max warfarin dose:     Target end date:     INR check location:     Preferred lab:     Send INR reminders to:      Indications   Paroxysmal atrial flutter (HCC) [I48.92] Long term (current) use of anticoagulants [Z79.01] [Z79.01]       Comments:           Allergies  Allergen Reactions  . Lisinopril Other (See Comments)    Acute kidney injury  . Amoxicillin Swelling and Other (See Comments)    Puffy eyes and abdominal pain with Amoxicillin PO  . Pravastatin Other (See Comments)    Weakness and fatigue  . Tamsulosin Other (See Comments)    dry throat, sweating, blurred vision  . Doxycycline Rash and Other (See Comments)    Questionable drug rxn rash   Prior to Admission medications   Medication Sig Start Date End Date Taking? Authorizing Provider  amiodarone (PACERONE) 200 MG tablet Take 1 tablet (200 mg total) by mouth daily. 10/01/15  Yes Doneen PoissonKlima, Lawrence, MD  calcium citrate-vitamin D (CITRACAL+D) 315-200 MG-UNIT per tablet Take 1 tablet by mouth daily.    Yes [provider]  carvedilol (COREG) 6.25 MG tablet Take 1 tablet (6.25 mg total) by mouth 2 (two) times daily. 06/30/16  Yes Valentino NoseBoswell, Nathan, MD  famotidine (PEPCID) 20 MG tablet TAKE ONE TABLET BY MOUTH BEFORE BREAKFAST AND ONE TABLET BEFORE SUPPER 08/26/16  Yes  Lewayne Buntingrenshaw, Brian S, MD  fish oil-omega-3 fatty acids 1000 MG capsule Take 1 g by mouth daily. 01/15/12  Yes Doneen PoissonKlima, Lawrence, MD  fluticasone (FLONASE) 50 MCG/ACT nasal spray Place 1 spray into both nostrils daily as needed for allergies. 12/18/15  Yes Doneen PoissonKlima, Lawrence, MD  gabapentin (NEURONTIN) 300 MG capsule Take 1 capsule (300 mg total) by mouth daily as needed (for stump pain). 06/13/16  Yes Doneen PoissonKlima, Lawrence, MD  hydroxypropyl methylcellulose (ISOPTO TEARS) 2.5 % ophthalmic solution Place 2 drops into both eyes daily as needed for dry eyes. Reported on 04/20/2015   Yes [provider]  levothyroxine (SYNTHROID, LEVOTHROID) 25 MCG tablet Take 1 tablet (25 mcg total) by mouth daily before breakfast. 09/17/16  Yes Doneen PoissonKlima, Lawrence, MD  Multiple Vitamins-Minerals (MULTIVITAMIN WITH MINERALS) tablet Take 1 tablet by mouth daily.   Yes [provider]  ondansetron (ZOFRAN) 4 MG tablet Take 1 tablet (4 mg total) by mouth every 8 (eight) hours as needed for nausea. 03/03/16  Yes Doneen PoissonKlima, Lawrence, MD  warfarin (COUMADIN) 2.5 MG tablet Take 1 & 1/2 tablets all days of week--EXCEPT on Mondays and Thursdays, take TWO (2) tablets on Mondays and Thursdays. 07/28/16  Yes Gust RungHoffman, Erik C, DO  atorvastatin (LIPITOR) 40 MG tablet Take 1 tablet (40 mg total) by mouth daily. 05/02/16 07/31/16  Lewayne Buntingrenshaw, Brian S, MD   Past Medical History:  Diagnosis Date  . Arthritis    "fingers" (02/07/2015)  . Atrial flutter (HCC) 07/26/2014   a. May 2016, CHA2DS2VASc = 4 -> Eliquis, spontaneous conversion to NSR;  b. On amio;  c. 01/2015 EPS: Unable to induce right sided Aflutter. Non-sustained Afib and Left sided Aflutter noted.  . Benign prostatic hypertrophy with nocturia 07/07/2006  . Cardiomyopathy (HCC)    a. 07/2014 Echo: EF 50%; b. 03/2015 Ech: EF 20-25%; c. 07/2015 Echo: EF 30-35%, Gr1 DD, sev LVH.  [name] Chronic venous insufficiency 04/12/2010  . Closed displaced fracture of left femoral neck (HCC) 06/14/2014   s/p left  hip hemiarthroplasty June 14, 2014   . Constipation 05/25/2009   Intermittent   . Coronary artery disease 04/02/2006   a. s/p RCA DES (2.5 x 24 mm TAXUS drug-eluting stent) 2006;  b. low risk myoview in 2011; c. 5/17 MV: EF 28%, large inf apical and inflat infarct w/o ischemia.  . Degenerative joint disease involving multiple joints 02/02/2007  . End stage renal disease on dialysis [facility])    a. TTS Dialysis - currently through right chest diatek, pending AVF.  [name] GERD (gastroesophageal reflux disease)   . Hepatitis    Hep C  . History of blood transfusion 05/2014   "related to hip OR"  . 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 Hyperlipidemia 04/02/2006  . Hypertension   . Hypertensive heart disease 12/09/2006  . Hypothyroidism   . Internal and external hemorrhoids without complication 08/20/2012  . Microcytic normochromic anemia 05/27/2006  . Obesity (BMI 30.0-34.9) 01/15/2012  . Phantom limb pain (HCC) 06/13/2016  . 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 Type 2 diabetes mellitus with peripheral artery disease (HCC) 05/27/2006   Absent pulses in the left foot   . 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 Marital status: Married    Spouse name: N/A  . Number of children: N/A  . Years of education: N/A   Social History Main Topics  . Smoking status: Former Smoker    Packs/day: 1.00    Years: 10.00    Types: Cigarettes    Quit date: 06/16/1968  . Smokeless tobacco: Never Used  . Alcohol use No      Comment: "quit alcohol in the 1960's"  . Drug use: No  . Sexual activity: Not Currently    Birth control/ protection: None   Other Topics Concern  . Not on file   Social History Narrative  . No narrative on file   Family History  Problem Relation Age of Onset  . Hypertension Mother   . Heart attack Father   . Breast cancer Sister   . Arthritis Sister   . Heart attack Brother   . Diabetes Brother   . Stroke Brother   . Pneumonia Daughter   . Diabetes Brother   . Alcoholism Brother   . Diabetes Brother   . Arthritis Sister        Bilateral knee replacement  . HIV Daughter   . Hypertension Daughter   .  Drug abuse Daughter   . Schizophrenia Daughter   . Hypertension Daughter   . Drug abuse Daughter   . Bipolar disorder Daughter     ASSESSMENT Recent Results: The most recent result is correlated with 32.5 mg per week: Lab Results  Component Value Date   INR 2.00 10/27/2016   INR 2.00 09/29/2016   INR 2.00 09/01/2016    Anticoagulation Dosing: INR as of 10/27/2016 and Previous Warfarin Dosing Information    INR Dt INR Goal Jacki Cones Sun Mon Tue Wed Thu Fri Sat   09/29/2016 2.00 2.0-3.0 32.5 mg 3.75 mg 5 mg 5 mg 5 mg 5 mg 5 mg 3.75 mg    Previous description   Take 1 & 1/2 tablets of your 2.5mg  green-colored warfarin tablets by mouth once-daily at [facility] on Saturdays and Sundays; ALL OTHER DAYS, take 2 tablets on these days of each week.     Anticoagulation Warfarin Dose Instructions as of 10/27/2016      Total Sun Mon Tue Wed Thu Fri Sat   New Dose 35 mg 5 mg 5 mg 5 mg 5 mg 5 mg 5 mg 5 mg     (2.5 mg x 2)  (2.5 mg x 2)  (2.5 mg x 2)  (2.5 mg x 2)  (2.5 mg x 2)  (2.5 mg x 2)  (2.5 mg x 2)                         Description   Take 1 & 1/2 tablets of your 2.5mg  green-colored warfarin tablets by mouth once-daily at 6PM. Based upon instructions from your Vascular Medicine Physician/Surgeon--your LAST DOSE OF WARFARIN will be on Saturday 11-AUG-18. AFTER procedure,  recommence when instructed by your Vascular Medicine Physician/Surgeon--using the last dosing instructions provided to you in the Anticoagulation Management Clinic.        INR today: Therapeutic  PLAN Weekly dose was increased by 8% to 35 mg per week  Patient Instructions  Patient instructed to take medications as defined in the Anti-coagulation Track section of this encounter.  Patient instructed to take today's dose.  Patient instructed to take 1 & 1/2 tablets of your 2.5mg  green-colored warfarin tablets by mouth once-daily at [facility].  Based upon instructions from your Vascular Medicine Physician/Surgeon--your LAST DOSE OF WARFARIN will be on Saturday 11-AUG-18. AFTER procedure, recommence when instructed by your Vascular Medicine Physician/Surgeon--using the last dosing instructions provided to you in the Anticoagulation Management Clinic.    Patient verbalized understanding of these instructions.     Patient advised to contact clinic or seek medical attention if signs/symptoms of bleeding or thromboembolism occur.  Patient verbalized understanding by repeating back information and was advised to contact me if further medication-related questions arise. Patient was also provided an information handout.  Follow-up Return in about 2 weeks (around 11/10/2016) for Follow up INR at 1000h.  Gar Ponto PharmD, CACP, CPP  15 minutes spent face-to-face with the patient during the encounter. 50% of time spent on education. 50% of time was spent on fingerstick point of care INR sample collection, processing, results interpretation and dose adjustment, and data entry in to [email or web address].

## 2016-10-27 NOTE — Patient Instructions (Signed)
Patient instructed to take medications as defined in the Anti-coagulation Track section of this encounter.  Patient instructed to take today's dose.  Patient instructed to take 1 & 1/2 tablets of your 2.5mg  green-colored warfarin tablets by mouth once-daily at 6PM.  Based upon instructions from your Vascular Medicine Physician/Surgeon--your LAST DOSE OF WARFARIN will be on Saturday 11-AUG-18. AFTER procedure, recommence when instructed by your Vascular Medicine Physician/Surgeon--using the last dosing instructions provided to you in the Anticoagulation Management Clinic.    Patient verbalized understanding of these instructions.

## 2016-10-30 NOTE — Progress Notes (Signed)
I reviewed Dr. Saralyn PilarGroce's note.  [facility]  INR 2.0 and coumadin increased.

## 2016-10-31 ENCOUNTER — Encounter: Payer: Self-pay | Admitting: Internal Medicine

## 2016-10-31 DIAGNOSIS — N400 Enlarged prostate without lower urinary tract symptoms: Secondary | ICD-10-CM

## 2016-10-31 DIAGNOSIS — R972 Elevated prostate specific antigen [PSA]: Secondary | ICD-10-CM

## 2016-10-31 HISTORY — DX: Benign prostatic hyperplasia without lower urinary tract symptoms: N40.0

## 2016-11-03 ENCOUNTER — Other Ambulatory Visit: Payer: Self-pay

## 2016-11-05 ENCOUNTER — Encounter (HOSPITAL_COMMUNITY)
Admission: RE | Disposition: A | Discharge status: Home / Self Care | Payer: Self-pay | Source: Ambulatory Visit | Attending: Vascular Surgery

## 2016-11-05 ENCOUNTER — Encounter: Payer: Self-pay | Admitting: Cardiology

## 2016-11-05 ENCOUNTER — Ambulatory Visit (HOSPITAL_COMMUNITY)
Admission: RE | Admit: 2016-11-05 | Discharge: 2016-11-05 | Disposition: A | Discharge status: Home / Self Care | Payer: Medicare Other | Source: Ambulatory Visit | Attending: Vascular Surgery | Admitting: Vascular Surgery

## 2016-11-05 DIAGNOSIS — Z87891 Personal history of nicotine dependence: Secondary | ICD-10-CM | POA: Insufficient documentation

## 2016-11-05 DIAGNOSIS — I120 Hypertensive chronic kidney disease with stage 5 chronic kidney disease or end stage renal disease: Secondary | ICD-10-CM | POA: Insufficient documentation

## 2016-11-05 DIAGNOSIS — Y832 Surgical operation with anastomosis, bypass or graft as the cause of abnormal reaction of the patient, or of later complication, without mention of misadventure at the time of the procedure: Secondary | ICD-10-CM | POA: Diagnosis not present

## 2016-11-05 DIAGNOSIS — I82291 Chronic embolism and thrombosis of other thoracic veins: Secondary | ICD-10-CM | POA: Diagnosis not present

## 2016-11-05 DIAGNOSIS — E1122 Type 2 diabetes mellitus with diabetic chronic kidney disease: Secondary | ICD-10-CM | POA: Diagnosis not present

## 2016-11-05 DIAGNOSIS — Z7901 Long term (current) use of anticoagulants: Secondary | ICD-10-CM | POA: Insufficient documentation

## 2016-11-05 DIAGNOSIS — Z955 Presence of coronary angioplasty implant and graft: Secondary | ICD-10-CM | POA: Insufficient documentation

## 2016-11-05 DIAGNOSIS — Z880 Allergy status to penicillin: Secondary | ICD-10-CM | POA: Diagnosis not present

## 2016-11-05 DIAGNOSIS — Z8249 Family history of ischemic heart disease and other diseases of the circulatory system: Secondary | ICD-10-CM | POA: Diagnosis not present

## 2016-11-05 DIAGNOSIS — T82898A Other specified complication of vascular prosthetic devices, implants and grafts, initial encounter: Secondary | ICD-10-CM | POA: Diagnosis not present

## 2016-11-05 DIAGNOSIS — I2510 Atherosclerotic heart disease of native coronary artery without angina pectoris: Secondary | ICD-10-CM | POA: Insufficient documentation

## 2016-11-05 DIAGNOSIS — E1151 Type 2 diabetes mellitus with diabetic peripheral angiopathy without gangrene: Secondary | ICD-10-CM | POA: Insufficient documentation

## 2016-11-05 DIAGNOSIS — N186 End stage renal disease: Secondary | ICD-10-CM | POA: Diagnosis not present

## 2016-11-05 DIAGNOSIS — Z96642 Presence of left artificial hip joint: Secondary | ICD-10-CM | POA: Insufficient documentation

## 2016-11-05 DIAGNOSIS — Z89611 Acquired absence of right leg above knee: Secondary | ICD-10-CM | POA: Diagnosis not present

## 2016-11-05 DIAGNOSIS — Z992 Dependence on renal dialysis: Secondary | ICD-10-CM | POA: Diagnosis not present

## 2016-11-05 DIAGNOSIS — Z79899 Other long term (current) drug therapy: Secondary | ICD-10-CM | POA: Insufficient documentation

## 2016-11-05 DIAGNOSIS — Z833 Family history of diabetes mellitus: Secondary | ICD-10-CM | POA: Insufficient documentation

## 2016-11-05 HISTORY — PX: PERIPHERAL VASCULAR INTERVENTION: CATH118257

## 2016-11-05 HISTORY — PX: A/V FISTULAGRAM: CATH118298

## 2016-11-05 HISTORY — PX: SVC VENOGRAPHY: CATH118302

## 2016-11-05 LAB — POCT I-STAT, CHEM 8
BUN: 41 mg/dL — ABNORMAL HIGH (ref 6–20)
Calcium, Ion: 1.04 mmol/L — ABNORMAL LOW (ref 1.15–1.40)
Chloride: 97 mmol/L — ABNORMAL LOW (ref 101–111)
Creatinine, Ser: 5.8 mg/dL — ABNORMAL HIGH (ref 0.61–1.24)
GLUCOSE: 135 mg/dL — AB (ref 65–99)
HEMATOCRIT: 43 % (ref 39.0–52.0)
HEMOGLOBIN: 14.6 g/dL (ref 13.0–17.0)
POTASSIUM: 3.9 mmol/L (ref 3.5–5.1)
Sodium: 142 mmol/L (ref 135–145)
TCO2: 31 mmol/L (ref 0–100)

## 2016-11-05 LAB — GLUCOSE, CAPILLARY
GLUCOSE-CAPILLARY: 132 mg/dL — AB (ref 65–99)
Glucose-Capillary: 103 mg/dL — ABNORMAL HIGH (ref 65–99)

## 2016-11-05 LAB — POCT ACTIVATED CLOTTING TIME
ACTIVATED CLOTTING TIME: 285 s
ACTIVATED CLOTTING TIME: 241 s
ACTIVATED CLOTTING TIME: 164 s
Activated Clotting Time: 279 seconds

## 2016-11-05 LAB — PROTIME-INR
INR: 1.74
Prothrombin Time: 20.5 seconds — ABNORMAL HIGH (ref 11.4–15.2)

## 2016-11-05 SURGERY — A/V FISTULAGRAM
Anesthesia: LOCAL | Laterality: Right

## 2016-11-05 MED ORDER — OXYCODONE HCL 5 MG PO TABS: 5.0000 mg | ORAL_TABLET | ORAL | Status: DC | PRN

## 2016-11-05 MED ORDER — HEPARIN (PORCINE) IN NACL 2-0.9 UNIT/ML-% IJ SOLN
INTRAMUSCULAR | Status: AC
  Filled 2016-11-05: qty 500

## 2016-11-05 MED ORDER — HYDRALAZINE HCL 20 MG/ML IJ SOLN
5.0000 mg | INTRAMUSCULAR | Status: AC | PRN
  Administered 2016-11-05 (×2): 5 mg via INTRAVENOUS

## 2016-11-05 MED ORDER — PROTAMINE SULFATE 10 MG/ML IV SOLN: 50.0000 mg | Freq: Once | INTRAVENOUS | Status: DC

## 2016-11-05 MED ORDER — SODIUM CHLORIDE 0.9 % IV SOLN: 250.0000 mL | INTRAVENOUS | Status: DC | PRN

## 2016-11-05 MED ORDER — HEPARIN (PORCINE) IN NACL 2-0.9 UNIT/ML-% IJ SOLN
INTRAMUSCULAR | Status: AC | PRN
  Administered 2016-11-05: 500 mL

## 2016-11-05 MED ORDER — HEPARIN SODIUM (PORCINE) 1000 UNIT/ML IJ SOLN
INTRAMUSCULAR | Status: DC | PRN
Start: 2016-11-05 — End: 2016-11-05
  Administered 2016-11-05 (×2): 3000 [IU] via INTRAVENOUS

## 2016-11-05 MED ORDER — LIDOCAINE HCL (PF) 1 % IJ SOLN
INTRAMUSCULAR | Status: AC
  Filled 2016-11-05: qty 30

## 2016-11-05 MED ORDER — LABETALOL HCL 5 MG/ML IV SOLN: 10.0000 mg | INTRAVENOUS | Status: DC | PRN

## 2016-11-05 MED ORDER — HEPARIN SODIUM (PORCINE) 1000 UNIT/ML IJ SOLN
INTRAMUSCULAR | Status: AC
  Filled 2016-11-05: qty 1

## 2016-11-05 MED ORDER — PROTAMINE SULFATE 10 MG/ML IV SOLN
INTRAVENOUS | Status: AC
  Filled 2016-11-05: qty 5

## 2016-11-05 MED ORDER — SODIUM CHLORIDE 0.9% FLUSH: 3.0000 mL | INTRAVENOUS | Status: DC | PRN

## 2016-11-05 MED ORDER — HYDRALAZINE HCL 20 MG/ML IJ SOLN
INTRAMUSCULAR | Status: AC
  Filled 2016-11-05: qty 1

## 2016-11-05 MED ORDER — IODIXANOL 320 MG/ML IV SOLN
INTRAVENOUS | Status: DC | PRN
  Administered 2016-11-05: 90 mL via INTRAVENOUS

## 2016-11-05 MED ORDER — SODIUM CHLORIDE 0.9% FLUSH: 3.0000 mL | Freq: Two times a day (BID) | INTRAVENOUS | Status: DC

## 2016-11-05 MED ORDER — PROTAMINE SULFATE 10 MG/ML IV SOLN
25.0000 mg | Freq: Once | INTRAVENOUS | Status: AC
  Administered 2016-11-05: 5 mg via INTRAVENOUS
  Administered 2016-11-05: 20 mg via INTRAVENOUS

## 2016-11-05 MED ORDER — MORPHINE SULFATE (PF) 10 MG/ML IV SOLN: 2.0000 mg | INTRAVENOUS | Status: DC | PRN

## 2016-11-05 MED ORDER — LIDOCAINE HCL (PF) 1 % IJ SOLN
INTRAMUSCULAR | Status: DC | PRN
  Administered 2016-11-05: 2 mL
  Administered 2016-11-05: 15 mL

## 2016-11-05 SURGICAL SUPPLY — 26 items
BAG SNAP BAND KOVER 36X36 (MISCELLANEOUS) ×3 IMPLANT
BALLN ARMADA 12X20X80 (BALLOONS) ×3
BALLN MUSTANG 10X20X75 (BALLOONS) ×3
BALLOON ARMADA 12X20X80 (BALLOONS) IMPLANT
BALLOON MUSTANG 10X20X75 (BALLOONS) IMPLANT
CATH ANGIO 5F BER2 100CM (CATHETERS) ×1 IMPLANT
CATH BEACON 5 .035 65 KMP TIP (CATHETERS) ×1 IMPLANT
CATH QUICKCROSS SUPP .035X90CM (MICROCATHETER) ×1 IMPLANT
CATH STRAIGHT 5FR 65CM (CATHETERS) ×1 IMPLANT
COVER DOME SNAP 22 D (MISCELLANEOUS) ×3 IMPLANT
COVER PRB 48X5XTLSCP FOLD TPE (BAG) ×2 IMPLANT
COVER PROBE 5X48 (BAG) ×3
DEVICE ONE SNARE 15MM (VASCULAR PRODUCTS) ×1 IMPLANT
GUIDEWIRE ANGLED .035X260CM (WIRE) ×2 IMPLANT
KIT MICROINTRODUCER STIFF 5F (SHEATH) ×1 IMPLANT
PROTECTION STATION PRESSURIZED (MISCELLANEOUS) ×3
SHEATH BRITE TIP 6FR 90CM (SHEATH) ×1 IMPLANT
SHEATH PINNACLE 5F 10CM (SHEATH) ×1 IMPLANT
SHEATH PINNACLE 6F 10CM (SHEATH) ×1 IMPLANT
SHEATH PINNACLE ST 6F 45CM (SHEATH) ×1 IMPLANT
STATION PROTECTION PRESSURIZED (MISCELLANEOUS) ×2 IMPLANT
STENT EPIC VASCULAR 12X40X75 (Permanent Stent) ×1 IMPLANT
STOPCOCK MORSE 400PSI 3WAY (MISCELLANEOUS) ×3 IMPLANT
TRAY PV CATH (CUSTOM PROCEDURE TRAY) ×3 IMPLANT
TUBING CIL FLEX 10 FLL-RA (TUBING) ×3 IMPLANT
WIRE BENTSON .035X145CM (WIRE) ×1 IMPLANT

## 2016-11-05 NOTE — Progress Notes (Addendum)
Site area: RFV Site Prior to Removal:  Level 0 Pressure Applied For:15 min Manual:  yes  Patient Status During Pull: stable  Post Pull Site:  Level 0 Post Pull Instructions Given:  yes Post Pull Pulses Present: AKA Dressing Applied: tegaderm  Bedrest begins @ 1850 till 2050 Comments:

## 2016-11-05 NOTE — Op Note (Signed)
    Patient name: [name] [name] MRN: [id number] DOB: 11/27/1945 Sex: male  11/05/2016 Pre-operative Diagnosis: esrd. Occluded right innominate vein Post-operative diagnosis:  Same Surgeon:  Apolinar JunesBrandon C. Randie Heinzain, MD Procedure Performed: 1.  US guided cannulation of right arm avf 2.  US guided cannulation of right common femoral vein 3.  Central venogram 4.  Placement of right innominate vein stent 12 x 40mm epic  Indications:  71 year old male on dialysis via a right arm AV fistula. He has had some pulsatility in the fistula as well as difficult clearances and was indicated for fistulogram which had demonstrated an innominate vein occlusion on the right. He was therefore indicated for repeat fistulogram and possible intervention.  Findings: The right innominate vein was occluded at its terminus just before junction with the SVC. This was crossed and stented open and I completion there were no further collaterals noted and brisk flow into the SVC with 0% residual stenosis.    Procedure:  The patient was identified in the holding area and taken to room 8.  The patient was then placed supine on the table and prepped and draped in the usual sterile fashion.  A time out was called.  Ultrasound was used to evaluate the right arm AV fistula and this was cannulated with micropuncture needle followed by placement micropuncture wire and sheath. We then performed a central venogram which demonstrated findings of occluded right innominate vein. A Benson wire was placed directed to the level of the innominate and repeat central venogram demonstrated again occluded right innominate vein. We then placed a long 6 French sheath attempted to cross antegrade through the lesion with Glidewire and bare catheter but could not. We then used ultrasound guidance to cannulate the right common femoral vein and placed a 6 French sheath into the SVC under fluoroscopic guidance. We attempted to cross retrograde with bare catheter and  Glidewire but could not. We then returned to the arm use tobacco and Glidewire were able to cross down into the SVC and pass a quick cross catheter. The Glidewire was then used in the floppy direction and was snared from below and pulled through and flushing technique. We then ballooned open the previously occluded area with 10 mm balloon followed by 12 mm balloon. At completion in June we still had 30% stenosis and so a 12 x 40 mm stent was placed. We then postdilated this with a 12 mm balloon and at completion we had 0% residual stenosis brisk runoff centrally and no identification of collaterals that had been there previously. Satisfied with this the wire was removed the sheath in the right arm was pulled out and closed for Monocryl. The sheath in the left groin will be pulled in postoperative holding. Patient tolerated procedure well without immediate complication.     [name] C. Randie Heinzain, MD [facility] Office: [phone] Pager: [phone]

## 2016-11-05 NOTE — H&P (Signed)
   History and Physical Update  The patient was interviewed and re-examined.  The patient's previous History and Physical has been reviewed and is unchanged from recent office visit. Plan for right arm and right femoral access for svc/innominate intervention.  Neola Worrall C. Randie Heinzain, MD [facility] Office: [phone] Pager: [phone]   11/05/2016, 12:04 PM

## 2016-11-05 NOTE — Discharge Instructions (Signed)
Venogram, Care After This sheet gives you information about how to care for yourself after your procedure. Your health care provider may also give you more specific instructions. If you have problems or questions, contact your health care provider. What can I expect after the procedure? After the procedure, it is common to have: Bruising or mild discomfort in the area where the IV was inserted (insertion site).  Follow these instructions at home: Eating and drinking Follow instructions from your health care provider about eating or drinking restrictions. Drink a lot of fluids for the first several days after the procedure, as directed by your health care provider. This helps to wash (flush) the contrast out of your body. Examples of healthy fluids include water or low-calorie drinks. General instructions Check your IV insertion area every day for signs of infection. Check for: Redness, swelling, or pain. Fluid or blood. Warmth. Pus or a bad smell. Take over-the-counter and prescription medicines only as told by your health care provider. Rest and return to your normal activities as told by your health care provider. Ask your health care provider what activities are safe for you. Do not drive for 24 hours if you were given a medicine to help you relax (sedative), or until your health care provider approves. Keep all follow-up visits as told by your health care provider. This is important. Contact a health care provider if: Your skin becomes itchy or you develop a rash or hives. You have a fever that does not get better with medicine. You feel nauseous. You vomit. You have redness, swelling, or pain around the insertion site. You have fluid or blood coming from the insertion site. Your insertion area feels warm to the touch. You have pus or a bad smell coming from the insertion site. Get help right away if: You have difficulty breathing or shortness of breath. You develop chest  pain. You faint. You feel very dizzy. These symptoms may represent a serious problem that is an emergency. Do not wait to see if the symptoms will go away. Get medical help right away. Call your local emergency services (911 in the [place] Do not drive yourself to the hospital. Summary After your procedure, it is common to have bruising or mild discomfort in the area where the IV was inserted. You should check your IV insertion area every day for signs of infection. Take over-the-counter and prescription medicines only as told by your health care provider. You should drink a lot of fluids for the first several days after the procedure to help flush the contrast from your body. This information is not intended to replace advice given to you by your health care provider. Make sure you discuss any questions you have with your health care provider. Document Released: 12/29/2012 Document Revised: 02/02/2016 Document Reviewed: 02/02/2016 Elsevier Interactive Patient Education  2017 [employer] Fistulogram, Care After Refer to this sheet in the next few weeks. These instructions provide you with information on caring for yourself after your procedure. Your health care provider may also give you more specific instructions. Your treatment has been planned according to current medical practices, but problems sometimes occur. Call your health care provider if you have any problems or questions after your procedure. What can I expect after the procedure? After your procedure, it is typical to have the following:  A small amount of discomfort in the area where the catheters were placed.  A small amount of bruising around the fistula.  Sleepiness and fatigue.  Follow  these instructions at home:  Rest at home for the day following your procedure.  Do not drive or operate heavy machinery while taking pain medicine.  Take medicines only as directed by your health care provider.  Do not take baths,  swim, or use a hot tub until your health care provider approves. You may shower 24 hours after the procedure or as directed by your health care provider.  There are many different ways to close and cover an incision, including stitches, skin glue, and adhesive strips. Follow your health care provider's instructions on: ? Incision care. ? Bandage (dressing) changes and removal. ? Incision closure removal.  Monitor your dialysis fistula carefully. Contact a health care provider if:  You have drainage, redness, swelling, or pain at your catheter site.  You have a fever.  You have chills. Get help right away if:  You feel weak.  You have trouble balancing.  You have trouble moving your arms or legs.  You have problems with your speech or vision.  You can no longer feel a vibration or buzz when you put your fingers over your dialysis fistula.  The limb that was used for the procedure: ? Swells. ? Is painful. ? Is cold. ? Is discolored, such as blue or pale white. This information is not intended to replace advice given to you by your health care provider. Make sure you discuss any questions you have with your health care provider. Document Released: 07/25/2013 Document Revised: 08/16/2015 Document Reviewed: 04/29/2013 Elsevier Interactive Patient Education  2018 [employer]

## 2016-11-06 ENCOUNTER — Encounter (HOSPITAL_COMMUNITY): Payer: Self-pay | Admitting: Vascular Surgery

## 2016-11-06 MED FILL — Heparin Sodium (Porcine) Inj 1000 Unit/ML: INTRAMUSCULAR | Qty: 10 | Status: AC

## 2016-11-10 ENCOUNTER — Ambulatory Visit (INDEPENDENT_AMBULATORY_CARE_PROVIDER_SITE_OTHER): Payer: Medicare Other | Admitting: Pharmacist

## 2016-11-10 DIAGNOSIS — I4892 Unspecified atrial flutter: Secondary | ICD-10-CM

## 2016-11-10 DIAGNOSIS — Z7901 Long term (current) use of anticoagulants: Secondary | ICD-10-CM

## 2016-11-10 LAB — POCT INR: INR: 1.5

## 2016-11-10 MED ORDER — WARFARIN SODIUM 2.5 MG PO TABS: ORAL_TABLET | ORAL | 2 refills | Status: DC

## 2016-11-10 NOTE — Patient Instructions (Signed)
Patient instructed to take medications as defined in the Anti-coagulation Track section of this encounter.  Patient instructed to take today's dose.  Patient instructed to take 1 & 1/2 tablets of your 2.5mg  green-colored warfarin tablets by mouth on Sundays, Wednesdays, and Fridays; take 2 tablets on all other days--once-daily at [facility]. Patient verbalized understanding of these instructions.

## 2016-11-10 NOTE — Progress Notes (Signed)
Anticoagulation Management [name] [name] is a 71 y.o. male who reports to the clinic for monitoring of warfarin treatment.    Indication: atrial flutter Duration: indefinite Supervising physician: Carlynn Purl  Anticoagulation Clinic Visit History: Patient does not report signs/symptoms of bleeding or thromboembolism  Other recent changes: No changes in diet, medications, lifestyle reported by the patient Anticoagulation Episode Summary    Current INR goal:   2.0-3.0  TTR:   51.3 % (1.4 y)  Next INR check:   11/17/2016  INR from last check:   1.5! (11/10/2016)  Weekly max warfarin dose:     Target end date:     INR check location:     Preferred lab:     Send INR reminders to:      Indications   Paroxysmal atrial flutter (HCC) [I48.92] Long term (current) use of anticoagulants [Z79.01] [Z79.01]       Comments:           Allergies  Allergen Reactions  . Lisinopril Other (See Comments)    Acute kidney injury  . Amoxicillin Swelling and Other (See Comments)    Puffy eyes and abdominal pain with Amoxicillin PO Has patient had a PCN reaction causing immediate rash, facial/tongue/throat swelling, SOB or lightheadedness with hypotension: Unknown Has patient had a PCN reaction causing severe rash involving mucus membranes or skin necrosis: Unknown Has patient had a PCN reaction that required hospitalization: Unknown Has patient had a PCN reaction occurring within the last 10 years: Unknown    . Pravastatin Other (See Comments)    Weakness and fatigue  . Tamsulosin Other (See Comments)    dry throat, sweating, blurred vision  . Doxycycline Rash and Other (See Comments)    Questionable drug rxn rash   Prior to Admission medications   Medication Sig Start Date End Date Taking? Authorizing Provider  acetaminophen (TYLENOL) 500 MG tablet Take 500-1,000 mg by mouth 2 (two) times daily as needed for moderate pain.   Yes [provider]  amiodarone (PACERONE) 200 MG  tablet Take 1 tablet (200 mg total) by mouth daily. 10/01/15  Yes Doneen Poisson, MD  calcium citrate-vitamin D (CITRACAL+D) 315-200 MG-UNIT per tablet Take 1 tablet by mouth daily.    Yes [provider]  carvedilol (COREG) 6.25 MG tablet Take 1 tablet (6.25 mg total) by mouth 2 (two) times daily. Patient taking differently: Take 6.25 mg by mouth as directed. Take 6.25 mg twice daily on non dialysis days, take 6.25 mg once daily in the evening on dialysis days (Tue, [place], Sat) 06/30/16  Yes Valentino Nose, MD  famotidine (PEPCID) 20 MG tablet TAKE ONE TABLET BY MOUTH BEFORE BREAKFAST AND ONE TABLET BEFORE SUPPER 08/26/16  Yes Lewayne Bunting, MD  fish oil-omega-3 fatty acids 1000 MG capsule Take 1 g by mouth daily. 01/15/12  Yes Doneen Poisson, MD  fluticasone (FLONASE) 50 MCG/ACT nasal spray Place 1 spray into both nostrils daily as needed for allergies. 12/18/15  Yes Doneen Poisson, MD  gabapentin (NEURONTIN) 300 MG capsule Take 1 capsule (300 mg total) by mouth daily as needed (for stump pain). 06/13/16  Yes Doneen Poisson, MD  hydroxypropyl methylcellulose (ISOPTO TEARS) 2.5 % ophthalmic solution Place 2 drops into both eyes daily as needed for dry eyes. Reported on 04/20/2015   Yes [provider]  levothyroxine (SYNTHROID, LEVOTHROID) 25 MCG tablet Take 1 tablet (25 mcg total) by mouth daily before breakfast. 09/17/16  Yes Doneen Poisson, MD  Multiple Vitamins-Minerals (MULTIVITAMIN WITH MINERALS)  tablet Take 1 tablet by mouth daily.   Yes [provider]  ondansetron (ZOFRAN) 4 MG tablet Take 1 tablet (4 mg total) by mouth every 8 (eight) hours as needed for nausea. 03/03/16  Yes Doneen Poisson, MD  warfarin (COUMADIN) 2.5 MG tablet Take 2 tablets all days of the week EXCEPT on Sundays, Wednesdays, and Fridays, take 1 & 1/2 tablets 11/10/16  Yes Gust Rung, DO  atorvastatin (LIPITOR) 40 MG tablet Take 1 tablet (40 mg total) by mouth daily. Patient taking  differently: Take 40 mg by mouth every evening.  05/02/16 11/03/16  Lewayne Bunting, MD   Past Medical History:  Diagnosis Date  . Arthritis    "fingers" (02/07/2015)  . Atrial flutter (HCC) 07/26/2014   a. May 2016, CHA2DS2VASc = 4 -> Eliquis, spontaneous conversion to NSR;  b. On amio;  c. 01/2015 EPS: Unable to induce right sided Aflutter. Non-sustained Afib and Left sided Aflutter noted.  . Benign prostatic hyperplasia with elevated prostate specific antigen (PSA) 10/31/2016   PSA (08/2008): 8.38, PSA (05/2009): 6.63, PSA (11/2009): 13.50, PSA (12/2009): 13.70, PSA (12/2010) 11.99, PSA (06/2011): 11.88, PSA (12/2011): 13.93, PSA (12/2014): 13.49 TRUS/BX (10/2008) Prostate 54 cc, Pathology BPH with single core of chronic inflammation  . Benign prostatic hypertrophy with nocturia 07/07/2006  . Cardiomyopathy (HCC)    a. 07/2014 Echo: EF 50%; b. 03/2015 Ech: EF 20-25%; c. 07/2015 Echo: EF 30-35%, Gr1 DD, sev LVH.  [name] Chronic venous insufficiency 04/12/2010  . Closed displaced fracture of left femoral neck (HCC) 06/14/2014   s/p left hip hemiarthroplasty June 14, 2014   . Constipation 05/25/2009   Intermittent   . Coronary artery disease 04/02/2006   a. s/p RCA DES (2.5 x 24 mm TAXUS drug-eluting stent) 2006;  b. low risk myoview in 2011; c. 5/17 MV: EF 28%, large inf apical and inflat infarct w/o ischemia.  . Degenerative joint disease involving multiple joints 02/02/2007  . End stage renal disease on dialysis [facility])    a. TTS Dialysis - currently through right chest diatek, pending AVF.  [name] GERD (gastroesophageal reflux disease)   . Hepatitis    Hep C  . History of blood transfusion 05/2014   "related to hip OR"  . 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 Hyperlipidemia 04/02/2006  . Hypertension   . Hypertensive heart disease 12/09/2006  . Hypothyroidism   . Internal and external hemorrhoids without complication 08/20/2012  . Microcytic normochromic anemia 05/27/2006  . Obesity (BMI  30.0-34.9) 01/15/2012  . Phantom limb pain (HCC) 06/13/2016  . 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 Type 2 diabetes mellitus with peripheral artery disease (HCC) 05/27/2006   Absent pulses in the left foot   . 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 Marital status: Married    Spouse name: N/A  . Number of children: N/A  . Years of education: N/A   Social History Main Topics  . Smoking status: Former Smoker    Packs/day: 1.00    Years: 10.00  Types: Cigarettes    Quit date: 06/16/1968  . Smokeless tobacco: Never Used  . Alcohol use No     Comment: "quit alcohol in the 1960's"  . Drug use: No  . Sexual activity: Not Currently    Birth control/ protection: None   Other Topics Concern  . Not on file   Social History Narrative  . No narrative on file   Family History  Problem Relation Age of Onset  . Hypertension Mother   . Heart attack Father   . Breast cancer Sister   . Arthritis Sister   . Heart attack Brother   . Diabetes Brother   . Stroke Brother   . Pneumonia Daughter   . Diabetes Brother   . Alcoholism Brother   . Diabetes Brother   . Arthritis Sister        Bilateral knee replacement  . HIV Daughter   . Hypertension Daughter   . Drug abuse Daughter   . Schizophrenia Daughter   . Hypertension Daughter   . Drug abuse Daughter   . Bipolar disorder Daughter     ASSESSMENT Recent Results: The most recent result is correlated with 10 mg per week: Lab Results  Component Value Date    INR 1.5 11/10/2016   INR 1.74 11/05/2016   INR 2.00 10/27/2016    Anticoagulation Dosing: INR as of 11/10/2016 and Previous Warfarin Dosing Information    INR Dt INR Goal Jacki Cones Sun Mon Tue Wed Thu Fri Sat   11/10/2016 1.5 2.0-3.0 10 mg 0 mg 0 mg 0 mg 0 mg 0 mg 5 mg 5 mg   Patient deviated from recommended dosing.       Previous description   Take 1 & 1/2 tablets of your 2.5mg  green-colored warfarin tablets by mouth once-daily at [facility]. Based upon instructions from your Vascular Medicine Physician/Surgeon--your LAST DOSE OF WARFARIN will be on Saturday 11-AUG-18. AFTER procedure, recommence when instructed by your Vascular Medicine Physician/Surgeon--using the last dosing instructions provided to you in the Anticoagulation Management Clinic.      Anticoagulation Warfarin Dose Instructions as of 11/10/2016      Total Sun Mon Tue Wed Thu Fri Sat   New Dose 31.25 mg 3.75 mg 5 mg 5 mg 3.75 mg 5 mg 3.75 mg 5 mg     (2.5 mg x 1.5)  (2.5 mg x 2)  (2.5 mg x 2)  (2.5 mg x 1.5)  (2.5 mg x 2)  (2.5 mg x 1.5)  (2.5 mg x 2)                         Description   Take 1 & 1/2 tablets of your 2.5mg  green-colored warfarin tablets by mouth on Sundays, Wednesdays, and Fridays; take 2 tablets on all other days--once-daily at [facility].     INR today: Subtherapeutic  PLAN Weekly dose was increased to 31.25mg .  Patient Instructions  Patient instructed to take medications as defined in the Anti-coagulation Track section of this encounter.  Patient instructed to take today's dose.  Patient instructed to take 1 & 1/2 tablets of your 2.5mg  green-colored warfarin tablets by mouth on Sundays, Wednesdays, and Fridays; take 2 tablets on all other days--once-daily at [facility]. Patient verbalized understanding of these instructions.   Patient advised to contact clinic or seek medical attention if signs/symptoms of bleeding or thromboembolism occur.  Patient verbalized understanding by repeating back information and  was advised to contact me if further  medication-related questions arise. Patient was also provided an information handout.  Follow-up Return in 7 days (on 11/17/2016) for Follow up INR at 1030.  Gar Ponto  15 minutes spent face-to-face with the patient during the encounter. 50% of time spent on education. 50% of time was spent on point of care INR fingerstick testing, processing, results, interpretation, and documentation in Epic/CHL and [email or web address].

## 2016-11-11 ENCOUNTER — Telehealth: Payer: Self-pay | Admitting: *Deleted

## 2016-11-11 NOTE — Telephone Encounter (Signed)
Per dr Jens Som, clearance for patient to hold warfarin 2 days prior to prostrate biopsy and resume the day of faxed to the number provided.

## 2016-11-13 ENCOUNTER — Telehealth: Payer: Self-pay | Admitting: Cardiology

## 2016-11-13 NOTE — Telephone Encounter (Signed)
New message        [facility] Pre-operative Risk Assessment    Request for surgical clearance:  1. What type of surgery is being performed?  In office prostate biopsy  2. When is this surgery scheduled?  Septermber  3. Are there any medications that need to be held prior to surgery and how long? Needs to hold plavix for 5 day per Dr Wylene Simmer  4. Name of physician performing surgery? Dr Ronal Fear  5. What is your office phone and fax number?  [phone]  Fax [phone]    Needs permission to hold the plavix for the extra 3 days  [name] [name] 11/13/2016, 9:26 AM  _________________________________________________________________   (provider comments below)

## 2016-11-13 NOTE — Telephone Encounter (Signed)
Spoke with [facility], aware the patient has appointment with [facility] 11-28-16. Will need to be seen for clearance.

## 2016-11-17 ENCOUNTER — Ambulatory Visit (INDEPENDENT_AMBULATORY_CARE_PROVIDER_SITE_OTHER): Payer: Medicare Other | Admitting: Pharmacist

## 2016-11-17 DIAGNOSIS — Z7901 Long term (current) use of anticoagulants: Secondary | ICD-10-CM | POA: Diagnosis not present

## 2016-11-17 DIAGNOSIS — I4892 Unspecified atrial flutter: Secondary | ICD-10-CM

## 2016-11-17 LAB — POCT INR: INR: 2.8

## 2016-11-17 NOTE — Progress Notes (Signed)
Anticoagulation Management [name] [name] is a 71 y.o. male who reports to the clinic for monitoring of warfarin treatment.    Indication: Paroxysmal atrial flutter; long term (current) use of anticoagulants.  Duration: indefinite Supervising physician: Earl Lagos  Anticoagulation Clinic Visit History: Patient does not report signs/symptoms of bleeding or thromboembolism  Other recent changes: No diet, medications, lifestyle endorsed by the patient to me.  Anticoagulation Episode Summary    Current INR goal:   2.0-3.0  TTR:   51.5 % (1.4 y)  Next INR check:   12/08/2016  INR from last check:   2.80 (11/17/2016)  Weekly max warfarin dose:     Target end date:     INR check location:     Preferred lab:     Send INR reminders to:      Indications   Paroxysmal atrial flutter (HCC) [I48.92] Long term (current) use of anticoagulants [Z79.01] [Z79.01]       Comments:           Allergies  Allergen Reactions  . Lisinopril Other (See Comments)    Acute kidney injury  . Amoxicillin Swelling and Other (See Comments)    Puffy eyes and abdominal pain with Amoxicillin PO Has patient had a PCN reaction causing immediate rash, facial/tongue/throat swelling, SOB or lightheadedness with hypotension: Unknown Has patient had a PCN reaction causing severe rash involving mucus membranes or skin necrosis: Unknown Has patient had a PCN reaction that required hospitalization: Unknown Has patient had a PCN reaction occurring within the last 10 years: Unknown    . Pravastatin Other (See Comments)    Weakness and fatigue  . Tamsulosin Other (See Comments)    dry throat, sweating, blurred vision  . Doxycycline Rash and Other (See Comments)    Questionable drug rxn rash   Prior to Admission medications   Medication Sig Start Date End Date Taking? Authorizing Provider  acetaminophen (TYLENOL) 500 MG tablet Take 500-1,000 mg by mouth 2 (two) times daily as needed for moderate pain.   Yes  [provider]  amiodarone (PACERONE) 200 MG tablet Take 1 tablet (200 mg total) by mouth daily. 10/01/15  Yes Doneen Poisson, MD  calcium citrate-vitamin D (CITRACAL+D) 315-200 MG-UNIT per tablet Take 1 tablet by mouth daily.    Yes [provider]  carvedilol (COREG) 6.25 MG tablet Take 1 tablet (6.25 mg total) by mouth 2 (two) times daily. Patient taking differently: Take 6.25 mg by mouth as directed. Take 6.25 mg twice daily on non dialysis days, take 6.25 mg once daily in the evening on dialysis days (Tue, [place], Sat) 06/30/16  Yes Valentino Nose, MD  famotidine (PEPCID) 20 MG tablet TAKE ONE TABLET BY MOUTH BEFORE BREAKFAST AND ONE TABLET BEFORE SUPPER 08/26/16  Yes Lewayne Bunting, MD  fish oil-omega-3 fatty acids 1000 MG capsule Take 1 g by mouth daily. 01/15/12  Yes Doneen Poisson, MD  fluticasone (FLONASE) 50 MCG/ACT nasal spray Place 1 spray into both nostrils daily as needed for allergies. 12/18/15  Yes Doneen Poisson, MD  gabapentin (NEURONTIN) 300 MG capsule Take 1 capsule (300 mg total) by mouth daily as needed (for stump pain). 06/13/16  Yes Doneen Poisson, MD  hydroxypropyl methylcellulose (ISOPTO TEARS) 2.5 % ophthalmic solution Place 2 drops into both eyes daily as needed for dry eyes. Reported on 04/20/2015   Yes [provider]  levothyroxine (SYNTHROID, LEVOTHROID) 25 MCG tablet Take 1 tablet (25 mcg total) by mouth daily before breakfast. 09/17/16  Yes  Doneen Poisson, MD  Multiple Vitamins-Minerals (MULTIVITAMIN WITH MINERALS) tablet Take 1 tablet by mouth daily.   Yes [provider]  ondansetron (ZOFRAN) 4 MG tablet Take 1 tablet (4 mg total) by mouth every 8 (eight) hours as needed for nausea. 03/03/16  Yes Doneen Poisson, MD  warfarin (COUMADIN) 2.5 MG tablet Take 2 tablets all days of the week EXCEPT on Sundays, Wednesdays, and Fridays, take 1 & 1/2 tablets 11/10/16  Yes Gust Rung, DO  atorvastatin (LIPITOR) 40 MG tablet Take 1 tablet  (40 mg total) by mouth daily. Patient taking differently: Take 40 mg by mouth every evening.  05/02/16 11/03/16  Lewayne Bunting, MD   Past Medical History:  Diagnosis Date  . Arthritis    "fingers" (02/07/2015)  . Atrial flutter (HCC) 07/26/2014   a. May 2016, CHA2DS2VASc = 4 -> Eliquis, spontaneous conversion to NSR;  b. On amio;  c. 01/2015 EPS: Unable to induce right sided Aflutter. Non-sustained Afib and Left sided Aflutter noted.  . Benign prostatic hyperplasia with elevated prostate specific antigen (PSA) 10/31/2016   PSA (08/2008): 8.38, PSA (05/2009): 6.63, PSA (11/2009): 13.50, PSA (12/2009): 13.70, PSA (12/2010) 11.99, PSA (06/2011): 11.88, PSA (12/2011): 13.93, PSA (12/2014): 13.49 TRUS/BX (10/2008) Prostate 54 cc, Pathology BPH with single core of chronic inflammation  . Benign prostatic hypertrophy with nocturia 07/07/2006  . Cardiomyopathy (HCC)    a. 07/2014 Echo: EF 50%; b. 03/2015 Ech: EF 20-25%; c. 07/2015 Echo: EF 30-35%, Gr1 DD, sev LVH.  [name] Chronic venous insufficiency 04/12/2010  . Closed displaced fracture of left femoral neck (HCC) 06/14/2014   s/p left hip hemiarthroplasty June 14, 2014   . Constipation 05/25/2009   Intermittent   . Coronary artery disease 04/02/2006   a. s/p RCA DES (2.5 x 24 mm TAXUS drug-eluting stent) 2006;  b. low risk myoview in 2011; c. 5/17 MV: EF 28%, large inf apical and inflat infarct w/o ischemia.  . Degenerative joint disease involving multiple joints 02/02/2007  . End stage renal disease on dialysis [facility])    a. TTS Dialysis - currently through right chest diatek, pending AVF.  [name] GERD (gastroesophageal reflux disease)   . Hepatitis    Hep C  . History of blood transfusion 05/2014   "related to hip OR"  . 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 Hyperlipidemia 04/02/2006  . Hypertension   . Hypertensive heart disease 12/09/2006  . Hypothyroidism   . Internal and external hemorrhoids without complication 08/20/2012  . Microcytic  normochromic anemia 05/27/2006  . Obesity (BMI 30.0-34.9) 01/15/2012  . Phantom limb pain (HCC) 06/13/2016  . 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 Type 2 diabetes mellitus with peripheral artery disease (HCC) 05/27/2006   Absent pulses in the left foot   . 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 Marital status: Married    Spouse name: N/A  . Number of children: N/A  . Years of education: N/A   Social History Main Topics  . Smoking status: Former Smoker  Packs/day: 1.00    Years: 10.00    Types: Cigarettes    Quit date: 06/16/1968  . Smokeless tobacco: Never Used  . Alcohol use No     Comment: "quit alcohol in the 1960's"  . Drug use: No  . Sexual activity: Not Currently    Birth control/ protection: None   Other Topics Concern  . Not on file   Social History Narrative  . No narrative on file   Family History  Problem Relation Age of Onset  . Hypertension Mother   . Heart attack Father   . Breast cancer Sister   . Arthritis Sister   . Heart attack Brother   . Diabetes Brother   . Stroke Brother   . Pneumonia Daughter   . Diabetes Brother   . Alcoholism Brother   . Diabetes Brother   . Arthritis Sister        Bilateral knee replacement  . HIV Daughter   . Hypertension Daughter   . Drug abuse Daughter   . Schizophrenia Daughter   . Hypertension Daughter   . Drug abuse Daughter   . Bipolar disorder Daughter     ASSESSMENT Recent Results: The most recent result is correlated with 31.25 mg  per week: Lab Results  Component Value Date   INR 2.80 11/17/2016   INR 1.5 11/10/2016   INR 1.74 11/05/2016    Anticoagulation Dosing: INR as of 11/17/2016 and Previous Warfarin Dosing Information    INR Dt INR Goal [facility] Sun Mon Tue Wed Thu Fri Sat   11/17/2016 2.80 2.0-3.0 31.25 mg 3.75 mg 5 mg 5 mg 3.75 mg 5 mg 3.75 mg 5 mg    Previous description   Take 1 & 1/2 tablets of your 2.5mg  green-colored warfarin tablets by mouth on Sundays, Wednesdays, and Fridays; take 2 tablets on all other days--once-daily at [facility]   Anticoagulation Warfarin Dose Instructions as of 11/17/2016      Total Sun Mon Tue Wed Thu Fri Sat   New Dose 31.25 mg 3.75 mg 5 mg 5 mg 3.75 mg 5 mg 3.75 mg 5 mg     (2.5 mg x 1.5)  (2.5 mg x 2)  (2.5 mg x 2)  (2.5 mg x 1.5)  (2.5 mg x 2)  (2.5 mg x 1.5)  (2.5 mg x 2)                         Description   Take 1 & 1/2 tablets of your 2.5mg  green-colored warfarin tablets by mouth on Sundays, Wednesdays, and Fridays; take 2 tablets on all other days--once-daily at [facility].     INR today: Therapeutic  PLAN Weekly dose was unchanged   Patient Instructions  Patient instructed to take medications as defined in the Anti-coagulation Track section of this encounter.  Patient instructed to take today's dose.  Patient instructed to take 1 & 1/2 tablets of your 2.5mg  green-colored warfarin tablets by mouth on Sundays, Wednesdays, and Fridays; take 2 tablets on all other days--once-daily at [facility]. Patient verbalized understanding of these instructions.     Patient advised to contact clinic or seek medical attention if signs/symptoms of bleeding or thromboembolism occur.  Patient verbalized understanding by repeating back information and was advised to contact me if further medication-related questions arise. Patient was also provided an information handout.  Follow-up Return in about 3 weeks (around 12/08/2016) for Follow up INR at 1100h.  Gar Ponto PharmD,  CACP, CPP  15 minutes spent face-to-face with the patient during the encounter. 50% of time spent on education. 50% of time was spent on fingerstick point of care INR sample collection, processing, results interpretation and documentation in to EPIC/CHL and [email or web address].

## 2016-11-17 NOTE — Progress Notes (Signed)
INTERNAL MEDICINE TEACHING ATTENDING ADDENDUM - Gust Rung, DO Duration- indefinate, Indication- a flutter, INR-  Lab Results  Component Value Date   INR 1.5 11/10/2016  . Agree with pharmacy recommendations as outlined in their note.

## 2016-11-17 NOTE — Patient Instructions (Signed)
Patient instructed to take medications as defined in the Anti-coagulation Track section of this encounter.  Patient instructed to take today's dose.  Patient instructed to take 1 & 1/2 tablets of your 2.5mg green-colored warfarin tablets by mouth on Sundays, Wednesdays, and Fridays; take 2 tablets on all other days--once-daily at 6PM. Patient verbalized understanding of these instructions.  

## 2016-11-18 NOTE — Progress Notes (Signed)
INTERNAL MEDICINE TEACHING ATTENDING ADDENDUM - Lynnsey Barbara M.D  Duration- indefinite, Indication- pAfib, INR- therapeutic. Agree with pharmacy recommendations as outlined in their note.      

## 2016-11-22 NOTE — Progress Notes (Signed)
HPI: FU coronary disease,status post PCI of his right coronary artery with drug- eluting stent in February 2006 and atrial flutter.An abdominal ultrasound in August 2006 showed no aneurysm. H/O atrial flutter.Echo 5/16 showed EF 50 with basal inferior akinesis, mild LAE, mild RAE/RVE. Placed on amiodarone and converted. Patient had attempted atrial flutter ablation in November 2016 but was unsuccessful. Echocardiogram May 2017 showed ejection fraction 30-35% and grade 1 diastolic dysfunction. Nuclear study May 2017 showed ejection fraction 28%. There was a large inferior apical and inferior lateral wall infarct with no ischemia. Cardiac catheterization June 2017 showed a 65% followed by 99% right coronary artery. There was a 60% first marginal. LV function described as hyperdynamic. Medical therapy recommended. Carotid Dopplers August 2017 showed 1-39% bilateral stenosis. Since last seen, patient denies dyspnea, chest pain, palpitations or syncope.  Current Outpatient Prescriptions  Medication Sig Dispense Refill  . acetaminophen (TYLENOL) 500 MG tablet Take 500-1,000 mg by mouth 2 (two) times daily as needed for moderate pain.    [name] amiodarone (PACERONE) 200 MG tablet Take 1 tablet (200 mg total) by mouth daily. 90 tablet 3  . calcium citrate-vitamin D (CITRACAL+D) 315-200 MG-UNIT per tablet Take 1 tablet by mouth daily.     . carvedilol (COREG) 6.25 MG tablet Take 1 tablet (6.25 mg total) by mouth 2 (two) times daily. (Patient taking differently: Take 6.25 mg by mouth as directed. Take 6.25 mg twice daily on non dialysis days, take 6.25 mg once daily in the evening on dialysis days (Tue, Thur, Sat)) 180 tablet 3  . famotidine (PEPCID) 20 MG tablet TAKE ONE TABLET BY MOUTH BEFORE BREAKFAST AND ONE TABLET BEFORE SUPPER 60 tablet 2  . fish oil-omega-3 fatty acids 1000 MG capsule Take 1 g by mouth daily.    . fluticasone (FLONASE) 50 MCG/ACT nasal spray Place 1 spray into both nostrils daily as  needed for allergies. 16 g 3  . gabapentin (NEURONTIN) 300 MG capsule Take 1 capsule (300 mg total) by mouth daily as needed (for stump pain). 30 capsule 11  . hydroxypropyl methylcellulose (ISOPTO TEARS) 2.5 % ophthalmic solution Place 2 drops into both eyes daily as needed for dry eyes. Reported on 04/20/2015    . levothyroxine (SYNTHROID, LEVOTHROID) 25 MCG tablet Take 1 tablet (25 mcg total) by mouth daily before breakfast. 90 tablet 3  . Multiple Vitamins-Minerals (MULTIVITAMIN WITH MINERALS) tablet Take 1 tablet by mouth daily.    . ondansetron (ZOFRAN) 4 MG tablet Take 1 tablet (4 mg total) by mouth every 8 (eight) hours as needed for nausea. 30 tablet 11  . warfarin (COUMADIN) 2.5 MG tablet Take 2 tablets all days of the week EXCEPT on Sundays, Wednesdays, and Fridays, take 1 & 1/2 tablets 46 tablet 2  . atorvastatin (LIPITOR) 40 MG tablet Take 1 tablet (40 mg total) by mouth daily. (Patient taking differently: Take 40 mg by mouth every evening. ) 90 tablet 3   No current facility-administered medications for this visit.      Past Medical History:  Diagnosis Date  . Arthritis    "fingers" (02/07/2015)  . Atrial flutter (HCC) 07/26/2014   a. May 2016, CHA2DS2VASc = 4 -> Eliquis, spontaneous conversion to NSR;  b. On amio;  c. 01/2015 EPS: Unable to induce right sided Aflutter. Non-sustained Afib and Left sided Aflutter noted.  . Benign prostatic hyperplasia with elevated prostate specific antigen (PSA) 10/31/2016   PSA (08/2008): 8.38, PSA (05/2009): 6.63, PSA (11/2009): 13.50, PSA (12/2009):  13.70, PSA (12/2010) 11.99, PSA (06/2011): 11.88, PSA (12/2011): 13.93, PSA (12/2014): 13.49 TRUS/BX (10/2008) Prostate 54 cc, Pathology BPH with single core of chronic inflammation  . Benign prostatic hypertrophy with nocturia 07/07/2006  . Cardiomyopathy (HCC)    a. 07/2014 Echo: EF 50%; b. 03/2015 Ech: EF 20-25%; c. 07/2015 Echo: EF 30-35%, Gr1 DD, sev LVH.  [name]. Chronic venous insufficiency 04/12/2010  .  Closed displaced fracture of left femoral neck (HCC) 06/14/2014   s/p left hip hemiarthroplasty June 14, 2014   . Constipation 05/25/2009   Intermittent   . Coronary artery disease 04/02/2006   a. s/p RCA DES (2.5 x 24 mm TAXUS drug-eluting stent) 2006;  b. low risk myoview in 2011; c. 5/17 MV: EF 28%, large inf apical and inflat infarct w/o ischemia.  . Degenerative joint disease involving multiple joints 02/02/2007  . End stage renal disease on dialysis [facility](HCC)    a. TTS Dialysis - currently through right chest diatek, pending AVF.  [name]. GERD (gastroesophageal reflux disease)   . Hepatitis    Hep C  . History of blood transfusion 05/2014   "related to hip OR"  . 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 Hyperlipidemia 04/02/2006  . Hypertension   . Hypertensive heart disease 12/09/2006  . Hypothyroidism   . Internal and external hemorrhoids without complication 08/20/2012  . Microcytic normochromic anemia 05/27/2006  . Obesity (BMI 30.0-34.9) 01/15/2012  . Phantom limb pain (HCC) 06/13/2016  . 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 Type 2 diabetes mellitus with peripheral artery disease (HCC) 05/27/2006   Absent pulses in the left foot   . 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 A/V FISTULAGRAM N/A 11/05/2016   Procedure: A/V Fistulagram Right Arm;  Surgeon: Maeola Harmanain, Brandon Christopher, MD;  [facility];  Service: Cardiovascular;  Laterality: N/A;  . AV FISTULA PLACEMENT  Left 10/08/2015   Procedure:  BRACHIOCEPHALIC ARTERIOVENOUS (AV) FISTULA CREATION Right Arm;  Surgeon: Fransisco HertzBrian L Chen, MD;  [name];  Service: Vascular;  Laterality: Left;  . CARDIAC CATHETERIZATION N/A 09/21/2015   Procedure: Left Heart Cath and Coronary Angiography;  Surgeon: Marykay Lexavid W Harding, MD;  [facility];  Service: Cardiovascular;  Laterality: N/A;  . CHOLECYSTECTOMY N/A 12/12/2014   Procedure: LAPAROSCOPIC CHOLECYSTECTOMY WITH INTRAOPERATIVE CHOLANGIOGRAM;  Surgeon: Gaynelle AduEric Wilson, MD;  Location: [facility] OR;  Service: General;  Laterality: N/A;  . CORONARY ANGIOPLASTY WITH STENT PLACEMENT    . ELECTROPHYSIOLOGIC STUDY N/A 02/07/2015   Procedure: A-Flutter Ablation;  Surgeon: Marinus MawGregg W Taylor, MD;  [facility];  Service: Cardiovascular;  Laterality: N/A;  . FRACTURE SURGERY    . INSERTION OF DIALYSIS CATHETER Right 06/13/2015   Procedure: INSERTION OF DIALYSIS CATHETER RIGHT INTERNAL JUGULAR;  Surgeon: Larina Earthlyodd F Early, MD;  [facility];  Service: Vascular;  Laterality: Right;  . JOINT REPLACEMENT    . LEG AMPUTATION ABOVE KNEE Right ~ 2008  . PERIPHERAL VASCULAR INTERVENTION Right 11/05/2016   Procedure: PERIPHERAL VASCULAR  INTERVENTION;  Surgeon: Maeola Harman, MD;  Location: [facility] INVASIVE CV LAB;  Service: Cardiovascular;  Laterality: Right;  INNOMINANTE VEIN  . PILONIDAL CYST EXCISION  1990's  . PROSTATE BIOPSY  ~ 2013  . REFRACTIVE SURGERY Bilateral   . REMOVAL OF A DIALYSIS CATHETER Right 06/13/2015   Procedure: REMOVAL OF A DIALYSIS CATHETER;  Surgeon: Larina Earthly, MD;  Location: [facility] OR;  Service: Vascular;  Laterality: Right;  . SVC VENOGRAPHY N/A 11/05/2016   Procedure: SVC Venography;  Surgeon: Maeola Harman, MD;  Location: [facility] INVASIVE CV LAB;  Service: Cardiovascular;  Laterality: N/A;  . TONSILLECTOMY    . TOTAL HIP ARTHROPLASTY Left 06/14/2014   Procedure: HEMI HIP ARTHROPLASTY ANTERIOR APPROACH;  Surgeon: Tarry Kos, MD;   Location: [facility] OR;  Service: Orthopedics;  Laterality: Left;    Social History   Social History  . Marital status: Married    Spouse name: N/A  . Number of children: N/A  . Years of education: N/A   Occupational History  . Not on file.   Social History Main Topics  . Smoking status: Former Smoker    Packs/day: 1.00    Years: 10.00    Types: Cigarettes    Quit date: 06/16/1968  . Smokeless tobacco: Never Used  . Alcohol use No     Comment: "quit alcohol in the 1960's"  . Drug use: No  . Sexual activity: Not Currently    Birth control/ protection: None   Other Topics Concern  . Not on file   Social History Narrative  . No narrative on file    Family History  Problem Relation Age of Onset  . Hypertension Mother   . Heart attack Father   . Breast cancer Sister   . Arthritis Sister   . Heart attack Brother   . Diabetes Brother   . Stroke Brother   . Pneumonia Daughter   . Diabetes Brother   . Alcoholism Brother   . Diabetes Brother   . Arthritis Sister        Bilateral knee replacement  . HIV Daughter   . Hypertension Daughter   . Drug abuse Daughter   . Schizophrenia Daughter   . Hypertension Daughter   . Drug abuse Daughter   . Bipolar disorder Daughter     ROS: no fevers or chills, productive cough, hemoptysis, dysphasia, odynophagia, melena, hematochezia, dysuria, hematuria, rash, seizure activity, orthopnea, PND, pedal edema, claudication. Remaining systems are negative.  Physical Exam: Well-developed well-nourished in no acute distress.  Skin is warm and dry.  HEENT is normal.  Neck is supple.  Chest is clear to auscultation with normal expansion.  Cardiovascular exam is regular rate and rhythm.  Abdominal exam nontender or distended. No masses palpated. Extremities Status post amputation neuro grossly intact   A/P  1 coronary artery disease-continue statin. Not on aspirin given need for anticoagulation.  2 history of atrial flutter-patient  remains in sinus rhythm. Continue amiodarone. Continue Coumadin. Check TSH, liver functions; chest x-ray performed in May.  3 hyperlipidemia-continue statin.  4 ischemic cardiomyopathy-improved on most recent evaluation at time of catheterization. Continue beta blocker.  5 End stage renal disease-per nephrology.   Olga Millers, MD

## 2016-11-28 ENCOUNTER — Encounter: Payer: Self-pay | Admitting: Cardiology

## 2016-11-28 ENCOUNTER — Ambulatory Visit (INDEPENDENT_AMBULATORY_CARE_PROVIDER_SITE_OTHER): Payer: Medicare Other | Admitting: Cardiology

## 2016-11-28 VITALS — BP 144/76 | HR 56 | Ht 68.0 in | Wt 160.0 lb

## 2016-11-28 DIAGNOSIS — I4892 Unspecified atrial flutter: Secondary | ICD-10-CM | POA: Diagnosis not present

## 2016-11-28 DIAGNOSIS — E7800 Pure hypercholesterolemia, unspecified: Secondary | ICD-10-CM | POA: Diagnosis not present

## 2016-11-28 DIAGNOSIS — I2510 Atherosclerotic heart disease of native coronary artery without angina pectoris: Secondary | ICD-10-CM | POA: Diagnosis not present

## 2016-11-28 NOTE — Patient Instructions (Signed)
Your physician wants you to follow-up in: 6 MONTHS WITH DR CRENSHAW You will receive a reminder letter in the mail two months in advance. If you don't receive a letter, please call our office to schedule the follow-up appointment.   If you need a refill on your cardiac medications before your next appointment, please call your pharmacy.  

## 2016-12-05 ENCOUNTER — Telehealth: Payer: Self-pay | Admitting: *Deleted

## 2016-12-05 NOTE — Telephone Encounter (Signed)
Patient is needing clearance for dr Vernie Ammons to do a prostrate ultrasound biopsy, they are also requesting directions regarding holding warfarin for 5 days prior to the procedure. Dr Jens Som has reviewed and has given the okay for the patient to hold warfarin 5 days prior to the procedure and resume the day of the procedure. Will fax this note to the number provided.

## 2016-12-08 ENCOUNTER — Ambulatory Visit (INDEPENDENT_AMBULATORY_CARE_PROVIDER_SITE_OTHER): Payer: Medicare Other | Admitting: Pharmacist

## 2016-12-08 DIAGNOSIS — I4892 Unspecified atrial flutter: Secondary | ICD-10-CM | POA: Diagnosis not present

## 2016-12-08 DIAGNOSIS — Z7901 Long term (current) use of anticoagulants: Secondary | ICD-10-CM | POA: Diagnosis not present

## 2016-12-08 LAB — POCT INR: INR: 2.1

## 2016-12-08 NOTE — Patient Instructions (Signed)
Patient instructed to take medications as defined in the Anti-coagulation Track section of this encounter.  Patient instructed to take today's dose.  Patient instructed to take 1 & 1/2 tablets of your 2.5mg  green-colored warfarin tablets by mouth on Saturdays and Sundays. All other days of the week--take TWO (2) tablets--once-daily at [facility]. Patient verbalized understanding of these instructions.

## 2016-12-08 NOTE — Progress Notes (Signed)
Anticoagulation Management [name] [name] is a 71 y.o. male who reports to the clinic for monitoring of warfarin treatment.    Indication: Paroxsymal atrial flutter (HCC) [148.92]; long-term (current) use of anticoagulants [Z79.01]  Duration: indefinite Supervising physician: Carlynn Purl  Anticoagulation Clinic Visit History: Patient does not report signs/symptoms of bleeding or thromboembolism  Other recent changes: No diet, medications, lifestyle endorsed by the patient to me.  Anticoagulation Episode Summary    Current INR goal:   2.0-3.0  TTR:   53.4 % (1.4 y)  Next INR check:   12/29/2016  INR from last check:   2.10 (12/08/2016)  Weekly max warfarin dose:     Target end date:     INR check location:     Preferred lab:     Send INR reminders to:      Indications   Paroxysmal atrial flutter (HCC) [I48.92] Long term (current) use of anticoagulants [Z79.01] [Z79.01]       Comments:           Allergies  Allergen Reactions  . Lisinopril Other (See Comments)    Acute kidney injury  . Amoxicillin Swelling and Other (See Comments)    Puffy eyes and abdominal pain with Amoxicillin PO Has patient had a PCN reaction causing immediate rash, facial/tongue/throat swelling, SOB or lightheadedness with hypotension: Unknown Has patient had a PCN reaction causing severe rash involving mucus membranes or skin necrosis: Unknown Has patient had a PCN reaction that required hospitalization: Unknown Has patient had a PCN reaction occurring within the last 10 years: Unknown    . Pravastatin Other (See Comments)    Weakness and fatigue  . Tamsulosin Other (See Comments)    dry throat, sweating, blurred vision  . Doxycycline Rash and Other (See Comments)    Questionable drug rxn rash   Prior to Admission medications   Medication Sig Start Date End Date Taking? Authorizing Provider  acetaminophen (TYLENOL) 500 MG tablet Take 500-1,000 mg by mouth 2 (two) times daily as needed for  moderate pain.   Yes [provider]  amiodarone (PACERONE) 200 MG tablet Take 1 tablet (200 mg total) by mouth daily. 10/01/15  Yes Doneen Poisson, MD  calcium citrate-vitamin D (CITRACAL+D) 315-200 MG-UNIT per tablet Take 1 tablet by mouth daily.    Yes [provider]  carvedilol (COREG) 6.25 MG tablet Take 1 tablet (6.25 mg total) by mouth 2 (two) times daily. Patient taking differently: Take 6.25 mg by mouth as directed. Take 6.25 mg twice daily on non dialysis days, take 6.25 mg once daily in the evening on dialysis days (Tue, [place], Sat) 06/30/16  Yes Valentino Nose, MD  famotidine (PEPCID) 20 MG tablet TAKE ONE TABLET BY MOUTH BEFORE BREAKFAST AND ONE TABLET BEFORE SUPPER 08/26/16  Yes Lewayne Bunting, MD  fish oil-omega-3 fatty acids 1000 MG capsule Take 1 g by mouth daily. 01/15/12  Yes Doneen Poisson, MD  fluticasone (FLONASE) 50 MCG/ACT nasal spray Place 1 spray into both nostrils daily as needed for allergies. 12/18/15  Yes Doneen Poisson, MD  gabapentin (NEURONTIN) 300 MG capsule Take 1 capsule (300 mg total) by mouth daily as needed (for stump pain). 06/13/16  Yes Doneen Poisson, MD  hydroxypropyl methylcellulose (ISOPTO TEARS) 2.5 % ophthalmic solution Place 2 drops into both eyes daily as needed for dry eyes. Reported on 04/20/2015   Yes [provider]  levothyroxine (SYNTHROID, LEVOTHROID) 25 MCG tablet Take 1 tablet (25 mcg total) by mouth daily before breakfast. 09/17/16  Yes Doneen Poisson, MD  Multiple Vitamins-Minerals (MULTIVITAMIN WITH MINERALS) tablet Take 1 tablet by mouth daily.   Yes [provider]  ondansetron (ZOFRAN) 4 MG tablet Take 1 tablet (4 mg total) by mouth every 8 (eight) hours as needed for nausea. 03/03/16  Yes Doneen Poisson, MD  warfarin (COUMADIN) 2.5 MG tablet Take 2 tablets all days of the week EXCEPT on Sundays, Wednesdays, and Fridays, take 1 & 1/2 tablets 11/10/16  Yes Gust Rung, DO  atorvastatin (LIPITOR) 40 MG  tablet Take 1 tablet (40 mg total) by mouth daily. Patient taking differently: Take 40 mg by mouth every evening.  05/02/16 11/03/16  Lewayne Bunting, MD   Past Medical History:  Diagnosis Date  . Arthritis    "fingers" (02/07/2015)  . Atrial flutter (HCC) 07/26/2014   a. May 2016, CHA2DS2VASc = 4 -> Eliquis, spontaneous conversion to NSR;  b. On amio;  c. 01/2015 EPS: Unable to induce right sided Aflutter. Non-sustained Afib and Left sided Aflutter noted.  . Benign prostatic hyperplasia with elevated prostate specific antigen (PSA) 10/31/2016   PSA (08/2008): 8.38, PSA (05/2009): 6.63, PSA (11/2009): 13.50, PSA (12/2009): 13.70, PSA (12/2010) 11.99, PSA (06/2011): 11.88, PSA (12/2011): 13.93, PSA (12/2014): 13.49 TRUS/BX (10/2008) Prostate 54 cc, Pathology BPH with single core of chronic inflammation  . Benign prostatic hypertrophy with nocturia 07/07/2006  . Cardiomyopathy (HCC)    a. 07/2014 Echo: EF 50%; b. 03/2015 Ech: EF 20-25%; c. 07/2015 Echo: EF 30-35%, Gr1 DD, sev LVH.  [name] Chronic venous insufficiency 04/12/2010  . Closed displaced fracture of left femoral neck (HCC) 06/14/2014   s/p left hip hemiarthroplasty June 14, 2014   . Constipation 05/25/2009   Intermittent   . Coronary artery disease 04/02/2006   a. s/p RCA DES (2.5 x 24 mm TAXUS drug-eluting stent) 2006;  b. low risk myoview in 2011; c. 5/17 MV: EF 28%, large inf apical and inflat infarct w/o ischemia.  . Degenerative joint disease involving multiple joints 02/02/2007  . End stage renal disease on dialysis [facility])    a. TTS Dialysis - currently through right chest diatek, pending AVF.  [name] GERD (gastroesophageal reflux disease)   . Hepatitis    Hep C  . History of blood transfusion 05/2014   "related to hip OR"  . 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 Hyperlipidemia 04/02/2006  . Hypertension   . Hypertensive heart disease 12/09/2006  . Hypothyroidism   . Internal and external hemorrhoids without complication  08/20/2012  . Microcytic normochromic anemia 05/27/2006  . Obesity (BMI 30.0-34.9) 01/15/2012  . Phantom limb pain (HCC) 06/13/2016  . 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 Type 2 diabetes mellitus with peripheral artery disease (HCC) 05/27/2006   Absent pulses in the left foot   . 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 Marital status: Married    Spouse name: N/A  . Number of children: N/A  . Years of education: N/A   Social History Main Topics  . Smoking status: Former Smoker  Packs/day: 1.00    Years: 10.00    Types: Cigarettes    Quit date: 06/16/1968  . Smokeless tobacco: Never Used  . Alcohol use No     Comment: "quit alcohol in the 1960's"  . Drug use: No  . Sexual activity: Not Currently    Birth control/ protection: None   Other Topics Concern  . Not on file   Social History Narrative  . No narrative on file   Family History  Problem Relation Age of Onset  . Hypertension Mother   . Heart attack Father   . Breast cancer Sister   . Arthritis Sister   . Heart attack Brother   . Diabetes Brother   . Stroke Brother   . Pneumonia Daughter   . Diabetes Brother   . Alcoholism Brother   . Diabetes Brother   . Arthritis Sister        Bilateral knee replacement  . HIV Daughter   . Hypertension Daughter   . Drug abuse Daughter   . Schizophrenia Daughter   . Hypertension Daughter   . Drug abuse Daughter   . Bipolar disorder Daughter     ASSESSMENT Recent Results: The most recent result is  correlated with 31.25 mg per week: Lab Results  Component Value Date   INR 2.10 12/08/2016   INR 2.80 11/17/2016   INR 1.5 11/10/2016    Anticoagulation Dosing: INR as of 12/08/2016 and Previous Warfarin Dosing Information    INR Dt INR Goal [facility] Sun Mon Tue Wed Thu Fri Sat   12/08/2016 2.10 2.0-3.0 31.25 mg 3.75 mg 5 mg 5 mg 3.75 mg 5 mg 3.75 mg 5 mg    Previous description   Take 1 & 1/2 tablets of your 2.5mg  green-colored warfarin tablets by mouth on Sundays, Wednesdays, and Fridays; take 2 tablets on all other days--once-daily at [facility].   Anticoagulation Warfarin Dose Instructions as of 12/08/2016      Total Sun Mon Tue Wed Thu Fri Sat   New Dose 33.75 mg 3.75 mg 5 mg 5 mg 5 mg 5 mg 5 mg 5 mg     (2.5 mg x 1.5)  (2.5 mg x 2)  (2.5 mg x 2)  (2.5 mg x 2)  (2.5 mg x 2)  (2.5 mg x 2)  (2.5 mg x 2)                         Description   Take 1 & 1/2 tablets of your 2.5mg  green-colored warfarin tablets by mouth on Saturdays and Sundays. All other days of the week--take TWO (2) tablets--once-daily at [facility]     INR today: Therapeutic  PLAN Weekly dose was increased by 7% to 33.5 mg per week  Patient Instructions  Patient instructed to take medications as defined in the Anti-coagulation Track section of this encounter.  Patient instructed to take today's dose.  Patient instructed to take 1 & 1/2 tablets of your 2.5mg  green-colored warfarin tablets by mouth on Saturdays and Sundays. All other days of the week--take TWO (2) tablets--once-daily at [facility]. Patient verbalized understanding of these instructions.     Patient advised to contact clinic or seek medical attention if signs/symptoms of bleeding or thromboembolism occur.  Patient verbalized understanding by repeating back information and was advised to contact me if further medication-related questions arise. Patient was also provided an information handout.  Follow-up Return in 3 weeks (on 12/29/2016) for Follow up INR at  1115h.  Elicia Lamp, PharmD, CACP, CPP  15 minutes spent face-to-face with the patient during the encounter. 50% of time spent on education. 50% of time was spent on fingerstick point of care INR sample collection, processing, results determination, interpretation of results, dose adjustment and documentation in [email or web address].

## 2016-12-09 NOTE — Progress Notes (Signed)
INTERNAL MEDICINE TEACHING ATTENDING ADDENDUM - Gust Rung, DO Duration- indefinate, Indication- a flutter, INR-  Lab Results  Component Value Date   INR 2.10 12/08/2016  . Agree with pharmacy recommendations as outlined in their note.

## 2016-12-12 ENCOUNTER — Ambulatory Visit (INDEPENDENT_AMBULATORY_CARE_PROVIDER_SITE_OTHER): Payer: Medicare Other | Admitting: Internal Medicine

## 2016-12-12 ENCOUNTER — Encounter: Payer: Self-pay | Admitting: Internal Medicine

## 2016-12-12 VITALS — BP 136/89 | HR 66 | Temp 98.0°F | Wt 168.6 lb

## 2016-12-12 DIAGNOSIS — I10 Essential (primary) hypertension: Secondary | ICD-10-CM

## 2016-12-12 DIAGNOSIS — N186 End stage renal disease: Secondary | ICD-10-CM | POA: Diagnosis not present

## 2016-12-12 DIAGNOSIS — E1122 Type 2 diabetes mellitus with diabetic chronic kidney disease: Secondary | ICD-10-CM

## 2016-12-12 DIAGNOSIS — R110 Nausea: Secondary | ICD-10-CM

## 2016-12-12 DIAGNOSIS — Z0000 Encounter for general adult medical examination without abnormal findings: Secondary | ICD-10-CM

## 2016-12-12 DIAGNOSIS — E1151 Type 2 diabetes mellitus with diabetic peripheral angiopathy without gangrene: Secondary | ICD-10-CM | POA: Diagnosis not present

## 2016-12-12 DIAGNOSIS — I120 Hypertensive chronic kidney disease with stage 5 chronic kidney disease or end stage renal disease: Secondary | ICD-10-CM | POA: Diagnosis not present

## 2016-12-12 DIAGNOSIS — J302 Other seasonal allergic rhinitis: Secondary | ICD-10-CM

## 2016-12-12 DIAGNOSIS — J301 Allergic rhinitis due to pollen: Secondary | ICD-10-CM

## 2016-12-12 DIAGNOSIS — K5900 Constipation, unspecified: Secondary | ICD-10-CM | POA: Diagnosis not present

## 2016-12-12 DIAGNOSIS — Z87891 Personal history of nicotine dependence: Secondary | ICD-10-CM | POA: Diagnosis not present

## 2016-12-12 DIAGNOSIS — E785 Hyperlipidemia, unspecified: Secondary | ICD-10-CM

## 2016-12-12 DIAGNOSIS — Z79899 Other long term (current) drug therapy: Secondary | ICD-10-CM

## 2016-12-12 DIAGNOSIS — Z992 Dependence on renal dialysis: Secondary | ICD-10-CM

## 2016-12-12 DIAGNOSIS — I25118 Atherosclerotic heart disease of native coronary artery with other forms of angina pectoris: Secondary | ICD-10-CM

## 2016-12-12 DIAGNOSIS — I4892 Unspecified atrial flutter: Secondary | ICD-10-CM | POA: Diagnosis not present

## 2016-12-12 DIAGNOSIS — I700 Atherosclerosis of aorta: Secondary | ICD-10-CM | POA: Diagnosis not present

## 2016-12-12 LAB — GLUCOSE, CAPILLARY: Glucose-Capillary: 287 mg/dL — ABNORMAL HIGH (ref 65–99)

## 2016-12-12 LAB — POCT GLYCOSYLATED HEMOGLOBIN (HGB A1C): HEMOGLOBIN A1C: 7.3

## 2016-12-12 MED ORDER — ATORVASTATIN CALCIUM 40 MG PO TABS: 40.0000 mg | ORAL_TABLET | Freq: Every evening | ORAL | 3 refills | Status: AC

## 2016-12-12 MED ORDER — FLUTICASONE PROPIONATE 50 MCG/ACT NA SUSP: 1.0000 | Freq: Every day | NASAL | 3 refills | Status: AC | PRN

## 2016-12-12 NOTE — Assessment & Plan Note (Signed)
Assessment  Recently his seasonal allergic rhinitis has been exacerbated. Fortunately, this has been well controlled with the as needed Flonase.  Plan  We will continue the Flonase as needed for his seasonal allergic rhinitis and reassess the efficacy of this therapy at the follow-up visit.

## 2016-12-12 NOTE — Assessment & Plan Note (Signed)
Assessment  He continues to go to hemodialysis and is tolerating it well from a physiologic standpoint. Most of today's visit centered around the issues he was having with the staff at his assigned dialysis center. He feels, and has had some people confirm his concerns, that they will occasionally purposely delay taking him off hemodialysis so he misses his scheduled bus and therefore has to wait longer to get home. Were it not for this issue, he would be happy with the hemodialysis.  Plan  I recommended that he fill out the Medicare questionnaire regarding his satisfaction with the hemodialysis center. I feel the only way there is going to be a change in the attitude of the staff there towards [name] [name] is to report the poor behavior that is taking place, as this may affect reimbursement, and therefore stimulate a change in attitude. He has requested a hemodialysis center change, although it is unclear where he is within the que. We will reassess his satisfaction with his hemodialysis center at the follow-up visit.

## 2016-12-12 NOTE — Patient Instructions (Addendum)
It was great to see you again.  I am sorry about how things are going at dialysis.  1) Keep taking the medications as you are.  2) I renewed your nasal spray for your allergies.  I will see you in 3 months, sooner if necessary.

## 2016-12-12 NOTE — Assessment & Plan Note (Signed)
Assessment  I suspect the diabetic control is less than ideal given the diet that he states he has been eating and the fact that his hemoglobin A1c is 7.3 while on hemodialysis. He is currently not on insulin or oral any hyperglycemics. That being said, tight diabetic control given his numerous comorbidities is likely not going to change outcomes considerably but negatively impact upon the quality of his life. At this point, he would like to manage his diabetes with diet alone.   Plan   We had a long discussion about certain aspects of his diet that he may be able to modify. He will eliminate as much bread as he can. He will also be very mindful of sweets. He is not interested in monitoring his blood sugars at this time. We will reassess the general control of his diabetes by reviewing his dietary progress as well as repeating a hemoglobin A1c at the follow-up visit. With regards to his peripheral arterial disease that's associated with the diabetes we will continue aggressive risk factor modification of his hypertension and continuing the high intensity statin therapy.

## 2016-12-12 NOTE — Assessment & Plan Note (Signed)
Assessment  He denies any current anginal pain while on the carvedilol 6.25 mg by mouth twice daily and atorvastatin 40 mg by mouth daily.  Plan  We will continue the carvedilol at 6.25 mg by mouth twice daily and atorvastatin at 40 mg by mouth daily. We will reassess the efficacy of managing his anginal pain on this regimen at the follow-up visit. We will also continue to work on his other modifiable risk factors including his diabetes and hypertension.

## 2016-12-12 NOTE — Assessment & Plan Note (Signed)
Assessment  His blood pressure today was well within target at 136/89. This is on carvedilol 6.25 mg by mouth twice daily.  Plan  We will continue the carvedilol at 6.25 mg by mouth twice daily and reassess his blood pressure control at the follow-up visit.

## 2016-12-12 NOTE — Assessment & Plan Note (Signed)
Assessment  He denies any palpitations or chest pain on his current regimen of carvedilol 6.25 mg by mouth twice daily and amiodarone 200 mg by mouth daily. Physical exam revealed a regular rhythm that was well controlled.  Plan  We will continue the carvedilol at 6.25 mg by mouth twice daily for rate control should he flipped back into atrial flutter. We are also continuing the amiodarone at 200 mg by mouth daily in hopes of maintaining normal sinus rhythm. We will reassess the rhythm as well as the efficacy of this therapy and managing his symptoms at the follow-up visit.

## 2016-12-12 NOTE — Assessment & Plan Note (Signed)
Assessment  He denies any left lower extremity claudication when he does use his walker and/or cane.  Plan  We will continue the aggressive risk factor modification including management of his diabetes, hyperlipidemia, and hypertension. We will reassess for symptomatic aortic atherosclerotic disease at the follow-up visit.

## 2016-12-12 NOTE — Progress Notes (Signed)
   Subjective:    Patient ID: [name] [name], male    DOB: 1945/06/17, 71 y.o.   MRN: [id number]  HPI  [name] [name] is here for follow-up of his diabetes, end stage renal disease on hemodialysis, coronary artery disease with stable angina, essential hypertension, paroxysmal atrial flutter, aortic atherosclerosis, and seasonal allergic rhinitis. Please see the A&P for the status of the pt's chronic medical problems.  Review of Systems  Constitutional: Negative for activity change, appetite change and unexpected weight change.  HENT: Positive for congestion and rhinorrhea.   Respiratory: Negative for cough, chest tightness, shortness of breath and wheezing.   Cardiovascular: Negative for chest pain, palpitations and leg swelling.  Gastrointestinal: Positive for constipation and nausea. Negative for abdominal distention, abdominal pain, diarrhea and vomiting.  Musculoskeletal: Negative for arthralgias, back pain, joint swelling and myalgias.  Skin: Negative for rash and wound.  Allergic/Immunologic: Positive for environmental allergies.  Neurological: Negative for dizziness and light-headedness.  Psychiatric/Behavioral: Negative for dysphoric mood. The patient is not nervous/anxious.       Objective:   Physical Exam  Constitutional: He is oriented to person, place, and time. He appears well-developed and well-nourished. No distress.  HENT:  Head: Normocephalic and atraumatic.  Eyes: Conjunctivae are normal. Right eye exhibits no discharge. Left eye exhibits no discharge. No scleral icterus.  Cardiovascular: Normal rate, regular rhythm and normal heart sounds.  Exam reveals no gallop and no friction rub.   No murmur heard. Pulmonary/Chest: Effort normal and breath sounds normal. No respiratory distress. He has no wheezes. He has no rales.  Abdominal: Soft. Bowel sounds are normal. He exhibits no distension. There is no tenderness. There is no rebound and no guarding.  Neurological:  He is alert and oriented to person, place, and time.  Skin: Skin is warm and dry. No rash noted. He is not diaphoretic. No erythema.  Psychiatric: He has a normal mood and affect. His behavior is normal. Judgment and thought content normal.  Nursing note and vitals reviewed.     Assessment & Plan:   Please see problem oriented charting.

## 2016-12-12 NOTE — Assessment & Plan Note (Signed)
Mr. Pitre states that he had his flu vaccination at his hemodialysis center.

## 2016-12-29 ENCOUNTER — Ambulatory Visit (INDEPENDENT_AMBULATORY_CARE_PROVIDER_SITE_OTHER): Payer: Medicare Other | Admitting: Pharmacist

## 2016-12-29 DIAGNOSIS — I4892 Unspecified atrial flutter: Secondary | ICD-10-CM | POA: Diagnosis not present

## 2016-12-29 DIAGNOSIS — Z7901 Long term (current) use of anticoagulants: Secondary | ICD-10-CM

## 2016-12-29 LAB — POCT INR: INR: 1.7

## 2016-12-29 NOTE — Patient Instructions (Signed)
Patient instructed to take medications as defined in the Anti-coagulation Track section of this encounter.  Patient instructed to take today's dose.  Patient instructed to take TWO (2) tablets of your 2.5mg  strength green warfarin tablets by mouth, once-daily at [facility]. Patient verbalized understanding of these instructions.

## 2016-12-29 NOTE — Progress Notes (Signed)
Anticoagulation Management [name] [name] is a 71 y.o. male who reports to the clinic for monitoring of warfarin treatment.    Indication: atrial fibrillation   Duration: indefinite Supervising physician: Blanch Media  Anticoagulation Clinic Visit History: Patient does not report signs/symptoms of bleeding or thromboembolism  Other recent changes: No diet, medications, lifestyle changes endorsed by the patient to me.  Anticoagulation Episode Summary    Current INR goal:   2.0-3.0  TTR:   52.3 % (1.5 y)  Next INR check:   01/19/2017  INR from last check:   1.70! (12/29/2016)  Weekly max warfarin dose:     Target end date:     INR check location:     Preferred lab:     Send INR reminders to:      Indications   Paroxysmal atrial flutter (HCC) [I48.92] Long term (current) use of anticoagulants [Z79.01] [Z79.01]       Comments:           Allergies  Allergen Reactions  . Lisinopril Other (See Comments)    Acute kidney injury  . Amoxicillin Swelling and Other (See Comments)    Puffy eyes and abdominal pain with Amoxicillin PO Has patient had a PCN reaction causing immediate rash, facial/tongue/throat swelling, SOB or lightheadedness with hypotension: Unknown Has patient had a PCN reaction causing severe rash involving mucus membranes or skin necrosis: Unknown Has patient had a PCN reaction that required hospitalization: Unknown Has patient had a PCN reaction occurring within the last 10 years: Unknown    . Pravastatin Other (See Comments)    Weakness and fatigue  . Tamsulosin Other (See Comments)    dry throat, sweating, blurred vision  . Doxycycline Rash and Other (See Comments)    Questionable drug rxn rash   Prior to Admission medications   Medication Sig Start Date End Date Taking? Authorizing Provider  acetaminophen (TYLENOL) 500 MG tablet Take 500-1,000 mg by mouth 2 (two) times daily as needed for moderate pain.   Yes [provider]  amiodarone  (PACERONE) 200 MG tablet Take 1 tablet (200 mg total) by mouth daily. 10/01/15  Yes Doneen Poisson, MD  atorvastatin (LIPITOR) 40 MG tablet Take 1 tablet (40 mg total) by mouth every evening. 12/12/16 03/12/17 Yes Doneen Poisson, MD  calcium citrate-vitamin D (CITRACAL+D) 315-200 MG-UNIT per tablet Take 1 tablet by mouth daily.    Yes [provider]  carvedilol (COREG) 6.25 MG tablet Take 1 tablet (6.25 mg total) by mouth 2 (two) times daily. Patient taking differently: Take 6.25 mg by mouth as directed. Take 6.25 mg twice daily on non dialysis days, take 6.25 mg once daily in the evening on dialysis days (Tue, [place], Sat) 06/30/16  Yes Valentino Nose, MD  famotidine (PEPCID) 20 MG tablet TAKE ONE TABLET BY MOUTH BEFORE BREAKFAST AND ONE TABLET BEFORE SUPPER 08/26/16  Yes Lewayne Bunting, MD  fish oil-omega-3 fatty acids 1000 MG capsule Take 1 g by mouth daily. 01/15/12  Yes Doneen Poisson, MD  fluticasone (FLONASE) 50 MCG/ACT nasal spray Place 1 spray into both nostrils daily as needed for allergies. 12/12/16  Yes Doneen Poisson, MD  gabapentin (NEURONTIN) 300 MG capsule Take 1 capsule (300 mg total) by mouth daily as needed (for stump pain). 06/13/16  Yes Doneen Poisson, MD  hydroxypropyl methylcellulose (ISOPTO TEARS) 2.5 % ophthalmic solution Place 2 drops into both eyes daily as needed for dry eyes. Reported on 04/20/2015   Yes [provider]  levothyroxine (SYNTHROID, LEVOTHROID)  25 MCG tablet Take 1 tablet (25 mcg total) by mouth daily before breakfast. 09/17/16  Yes Doneen Poisson, MD  Multiple Vitamins-Minerals (MULTIVITAMIN WITH MINERALS) tablet Take 1 tablet by mouth daily.   Yes [provider]  ondansetron (ZOFRAN) 4 MG tablet Take 1 tablet (4 mg total) by mouth every 8 (eight) hours as needed for nausea. 03/03/16  Yes Doneen Poisson, MD  warfarin (COUMADIN) 2.5 MG tablet Take 2 tablets all days of the week EXCEPT on Sundays, Wednesdays, and Fridays, take 1 & 1/2  tablets 11/10/16  Yes Gust Rung, DO   Past Medical History:  Diagnosis Date  . Arthritis    "fingers" (02/07/2015)  . Atrial flutter (HCC) 07/26/2014   a. May 2016, CHA2DS2VASc = 4 -> Eliquis, spontaneous conversion to NSR;  b. On amio;  c. 01/2015 EPS: Unable to induce right sided Aflutter. Non-sustained Afib and Left sided Aflutter noted.  . Benign prostatic hyperplasia with elevated prostate specific antigen (PSA) 10/31/2016   PSA (08/2008): 8.38, PSA (05/2009): 6.63, PSA (11/2009): 13.50, PSA (12/2009): 13.70, PSA (12/2010) 11.99, PSA (06/2011): 11.88, PSA (12/2011): 13.93, PSA (12/2014): 13.49 TRUS/BX (10/2008) Prostate 54 cc, Pathology BPH with single core of chronic inflammation  . Benign prostatic hypertrophy with nocturia 07/07/2006  . Cardiomyopathy (HCC)    a. 07/2014 Echo: EF 50%; b. 03/2015 Ech: EF 20-25%; c. 07/2015 Echo: EF 30-35%, Gr1 DD, sev LVH.  [name] Chronic venous insufficiency 04/12/2010  . Closed displaced fracture of left femoral neck (HCC) 06/14/2014   s/p left hip hemiarthroplasty June 14, 2014   . Constipation 05/25/2009   Intermittent   . Coronary artery disease 04/02/2006   a. s/p RCA DES (2.5 x 24 mm TAXUS drug-eluting stent) 2006;  b. low risk myoview in 2011; c. 5/17 MV: EF 28%, large inf apical and inflat infarct w/o ischemia.  . Degenerative joint disease involving multiple joints 02/02/2007  . End stage renal disease on dialysis [facility])    a. TTS Dialysis - currently through right chest diatek, pending AVF.  [name] GERD (gastroesophageal reflux disease)   . Hepatitis    Hep C  . History of blood transfusion 05/2014   "related to hip OR"  . 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 Hyperlipidemia 04/02/2006  . Hypertension   . Hypertensive heart disease 12/09/2006  . Hypothyroidism   . Internal and external hemorrhoids without complication 08/20/2012  . Microcytic normochromic anemia 05/27/2006  . Obesity (BMI 30.0-34.9) 01/15/2012  . Phantom limb pain  (HCC) 06/13/2016  . 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 Type 2 diabetes mellitus with peripheral artery disease (HCC) 05/27/2006   Absent pulses in the left foot   . 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 Marital status: Married    Spouse name: N/A  . Number of children: N/A  . Years of education: N/A   Social History Main Topics  . Smoking status: Former Smoker    Packs/day: 1.00    Years: 10.00    Types: Cigarettes  Quit date: 06/16/1968  . Smokeless tobacco: Never Used  . Alcohol use No     Comment: "quit alcohol in the 1960's"  . Drug use: No  . Sexual activity: Not Currently    Birth control/ protection: None   Other Topics Concern  . Not on file   Social History Narrative  . No narrative on file   Family History  Problem Relation Age of Onset  . Hypertension Mother   . Heart attack Father   . Breast cancer Sister   . Arthritis Sister   . Heart attack Brother   . Diabetes Brother   . Stroke Brother   . Pneumonia Daughter   . Diabetes Brother   . Alcoholism Brother   . Diabetes Brother   . Arthritis Sister        Bilateral knee replacement  . HIV Daughter   . Hypertension Daughter   . Drug abuse Daughter   . Schizophrenia Daughter   . Hypertension Daughter   . Drug abuse Daughter   . Bipolar disorder Daughter     ASSESSMENT Recent Results: The most recent result is correlated with 33.75 mg per week: Lab Results  Component Value Date   INR 1.70 12/29/2016   INR 2.10  12/08/2016   INR 2.80 11/17/2016    Anticoagulation Dosing: INR as of 12/29/2016 and Previous Warfarin Dosing Information    INR Dt INR Goal [facility] Sun Mon Tue Wed Thu Fri Sat   12/29/2016 1.70 2.0-3.0 33.75 mg 3.75 mg 5 mg 5 mg 5 mg 5 mg 5 mg 5 mg    Previous description   Take 1 & 1/2 tablets of your 2.5mg  green-colored warfarin tablets by mouth on Saturdays and Sundays. All other days of the week--take TWO (2) tablets--once-daily at [facility].   Anticoagulation Warfarin Dose Instructions as of 12/29/2016      Total Sun Mon Tue Wed Thu Fri Sat   New Dose 35 mg 5 mg 5 mg 5 mg 5 mg 5 mg 5 mg 5 mg     (2.5 mg x 2)  (2.5 mg x 2)  (2.5 mg x 2)  (2.5 mg x 2)  (2.5 mg x 2)  (2.5 mg x 2)  (2.5 mg x 2)                         Description   Take TWO (2) tablets of your 2.5mg  strength green warfarin tablets by mouth, once-daily at 6PM.     INR today: Subtherapeutic  PLAN Weekly dose was increased by 4% to 35 mg per week  Patient Instructions  Patient instructed to take medications as defined in the Anti-coagulation Track section of this encounter.  Patient instructed to take today's dose.  Patient instructed to take TWO (2) tablets of your 2.5mg  strength green warfarin tablets by mouth, once-daily at [facility]. Patient verbalized understanding of these instructions.     Patient advised to contact clinic or seek medical attention if signs/symptoms of bleeding or thromboembolism occur.  Patient verbalized understanding by repeating back information and was advised to contact me if further medication-related questions arise. Patient was also provided an information handout.  Follow-up Return in about 3 weeks (around 01/19/2017) for Follow up INR at 1115h.  Elicia Lamp, PharmD, CACP, CPP  15 minutes spent face-to-face with the patient during the encounter. 50% of time spent on education. 50% of time was spent on fingerstick point of care INR sample  collection, processing, results  determination and dose adjustment; documentation in EPIC/CHL and [email or web address].

## 2016-12-30 ENCOUNTER — Other Ambulatory Visit: Payer: Self-pay | Admitting: Cardiology

## 2017-01-14 ENCOUNTER — Telehealth: Payer: Self-pay | Admitting: *Deleted

## 2017-01-14 NOTE — Telephone Encounter (Signed)
Call made [facility] on pt's behalf at wife's request.  Pt [facility], but is requesting to be transferred [facility].  Pt's wife states that she had made the request over a month ago. I contacted dialysis center [facility] to see if pt was on the waiting list and was informed that he was not.  I spoke with Rosalita ChessmanSuzanne (Child psychotherapistocial Worker) at [facility]- she was aware of the request and she place patient on waiting list and will follow up to make sure.  She also informed me that pt has missed his last 3 dialysis.Criss Alvine.Lavante Toso Cassady10/24/20184:51 PM      [facility] [address]  [place], [place] [id number] [phone] [facility](Center Phone)  [facility] [address]  [place], [place] [id number] [phone] [facility](Center Phone)

## 2017-01-15 ENCOUNTER — Inpatient Hospital Stay (HOSPITAL_COMMUNITY)
Admission: EM | Admit: 2017-01-15 | Discharge: 2017-01-15 | DRG: 640 | Discharge status: Against Medical Advice | Payer: Medicare Other | Attending: Internal Medicine | Admitting: Internal Medicine

## 2017-01-15 ENCOUNTER — Encounter (HOSPITAL_COMMUNITY): Payer: Self-pay | Admitting: *Deleted

## 2017-01-15 ENCOUNTER — Emergency Department (HOSPITAL_COMMUNITY): Payer: Medicare Other

## 2017-01-15 DIAGNOSIS — I4891 Unspecified atrial fibrillation: Secondary | ICD-10-CM | POA: Diagnosis not present

## 2017-01-15 DIAGNOSIS — I509 Heart failure, unspecified: Secondary | ICD-10-CM | POA: Diagnosis not present

## 2017-01-15 DIAGNOSIS — E1151 Type 2 diabetes mellitus with diabetic peripheral angiopathy without gangrene: Secondary | ICD-10-CM | POA: Diagnosis not present

## 2017-01-15 DIAGNOSIS — D631 Anemia in chronic kidney disease: Secondary | ICD-10-CM | POA: Diagnosis present

## 2017-01-15 DIAGNOSIS — Z91048 Other nonmedicinal substance allergy status: Secondary | ICD-10-CM

## 2017-01-15 DIAGNOSIS — E8889 Other specified metabolic disorders: Secondary | ICD-10-CM | POA: Diagnosis not present

## 2017-01-15 DIAGNOSIS — E8779 Other fluid overload: Principal | ICD-10-CM | POA: Diagnosis present

## 2017-01-15 DIAGNOSIS — I214 Non-ST elevation (NSTEMI) myocardial infarction: Secondary | ICD-10-CM | POA: Diagnosis present

## 2017-01-15 DIAGNOSIS — I255 Ischemic cardiomyopathy: Secondary | ICD-10-CM | POA: Diagnosis present

## 2017-01-15 DIAGNOSIS — K219 Gastro-esophageal reflux disease without esophagitis: Secondary | ICD-10-CM | POA: Diagnosis present

## 2017-01-15 DIAGNOSIS — Z89611 Acquired absence of right leg above knee: Secondary | ICD-10-CM | POA: Diagnosis not present

## 2017-01-15 DIAGNOSIS — Z955 Presence of coronary angioplasty implant and graft: Secondary | ICD-10-CM

## 2017-01-15 DIAGNOSIS — E1141 Type 2 diabetes mellitus with diabetic mononeuropathy: Secondary | ICD-10-CM | POA: Diagnosis present

## 2017-01-15 DIAGNOSIS — E1139 Type 2 diabetes mellitus with other diabetic ophthalmic complication: Secondary | ICD-10-CM | POA: Diagnosis present

## 2017-01-15 DIAGNOSIS — Z881 Allergy status to other antibiotic agents status: Secondary | ICD-10-CM

## 2017-01-15 DIAGNOSIS — I10 Essential (primary) hypertension: Secondary | ICD-10-CM

## 2017-01-15 DIAGNOSIS — R0609 Other forms of dyspnea: Secondary | ICD-10-CM

## 2017-01-15 DIAGNOSIS — E1122 Type 2 diabetes mellitus with diabetic chronic kidney disease: Secondary | ICD-10-CM | POA: Diagnosis present

## 2017-01-15 DIAGNOSIS — Z7901 Long term (current) use of anticoagulants: Secondary | ICD-10-CM

## 2017-01-15 DIAGNOSIS — Z9115 Patient's noncompliance with renal dialysis: Secondary | ICD-10-CM

## 2017-01-15 DIAGNOSIS — Z888 Allergy status to other drugs, medicaments and biological substances status: Secondary | ICD-10-CM

## 2017-01-15 DIAGNOSIS — E039 Hypothyroidism, unspecified: Secondary | ICD-10-CM | POA: Diagnosis present

## 2017-01-15 DIAGNOSIS — I132 Hypertensive heart and chronic kidney disease with heart failure and with stage 5 chronic kidney disease, or end stage renal disease: Secondary | ICD-10-CM | POA: Diagnosis not present

## 2017-01-15 DIAGNOSIS — E1149 Type 2 diabetes mellitus with other diabetic neurological complication: Secondary | ICD-10-CM | POA: Diagnosis not present

## 2017-01-15 DIAGNOSIS — Z79899 Other long term (current) drug therapy: Secondary | ICD-10-CM

## 2017-01-15 DIAGNOSIS — I739 Peripheral vascular disease, unspecified: Secondary | ICD-10-CM | POA: Diagnosis present

## 2017-01-15 DIAGNOSIS — E113299 Type 2 diabetes mellitus with mild nonproliferative diabetic retinopathy without macular edema, unspecified eye: Secondary | ICD-10-CM | POA: Diagnosis not present

## 2017-01-15 DIAGNOSIS — I872 Venous insufficiency (chronic) (peripheral): Secondary | ICD-10-CM | POA: Diagnosis present

## 2017-01-15 DIAGNOSIS — Z96642 Presence of left artificial hip joint: Secondary | ICD-10-CM | POA: Diagnosis present

## 2017-01-15 DIAGNOSIS — N186 End stage renal disease: Secondary | ICD-10-CM | POA: Diagnosis present

## 2017-01-15 DIAGNOSIS — N400 Enlarged prostate without lower urinary tract symptoms: Secondary | ICD-10-CM | POA: Diagnosis present

## 2017-01-15 DIAGNOSIS — F0390 Unspecified dementia without behavioral disturbance: Secondary | ICD-10-CM | POA: Diagnosis not present

## 2017-01-15 DIAGNOSIS — E8770 Fluid overload, unspecified: Secondary | ICD-10-CM | POA: Diagnosis present

## 2017-01-15 DIAGNOSIS — M1990 Unspecified osteoarthritis, unspecified site: Secondary | ICD-10-CM | POA: Diagnosis present

## 2017-01-15 DIAGNOSIS — I4892 Unspecified atrial flutter: Secondary | ICD-10-CM | POA: Diagnosis present

## 2017-01-15 DIAGNOSIS — I2510 Atherosclerotic heart disease of native coronary artery without angina pectoris: Secondary | ICD-10-CM | POA: Diagnosis present

## 2017-01-15 DIAGNOSIS — Z992 Dependence on renal dialysis: Secondary | ICD-10-CM

## 2017-01-15 DIAGNOSIS — Z5321 Procedure and treatment not carried out due to patient leaving prior to being seen by health care provider: Secondary | ICD-10-CM | POA: Diagnosis not present

## 2017-01-15 DIAGNOSIS — E785 Hyperlipidemia, unspecified: Secondary | ICD-10-CM | POA: Diagnosis present

## 2017-01-15 DIAGNOSIS — Z880 Allergy status to penicillin: Secondary | ICD-10-CM

## 2017-01-15 DIAGNOSIS — Z87891 Personal history of nicotine dependence: Secondary | ICD-10-CM

## 2017-01-15 LAB — CBC
HCT: 29.7 % — ABNORMAL LOW (ref 39.0–52.0)
HEMOGLOBIN: 9.1 g/dL — AB (ref 13.0–17.0)
MCH: 23.4 pg — AB (ref 26.0–34.0)
MCHC: 30.6 g/dL (ref 30.0–36.0)
MCV: 76.3 fL — AB (ref 78.0–100.0)
PLATELETS: 121 10*3/uL — AB (ref 150–400)
RBC: 3.89 MIL/uL — ABNORMAL LOW (ref 4.22–5.81)
RDW: 18.7 % — ABNORMAL HIGH (ref 11.5–15.5)
WBC: 6.5 10*3/uL (ref 4.0–10.5)

## 2017-01-15 LAB — BASIC METABOLIC PANEL
Anion gap: 12 (ref 5–15)
BUN: 90 mg/dL — ABNORMAL HIGH (ref 6–20)
CALCIUM: 7.8 mg/dL — AB (ref 8.9–10.3)
CHLORIDE: 114 mmol/L — AB (ref 101–111)
CO2: 15 mmol/L — AB (ref 22–32)
CREATININE: 7.59 mg/dL — AB (ref 0.61–1.24)
GFR calc Af Amer: 7 mL/min — ABNORMAL LOW (ref >60)
GFR calc non Af Amer: 6 mL/min — ABNORMAL LOW (ref >60)
GLUCOSE: 124 mg/dL — AB (ref 65–99)
Potassium: 5.1 mmol/L (ref 3.5–5.1)
Sodium: 141 mmol/L (ref 135–145)

## 2017-01-15 LAB — I-STAT TROPONIN, ED: TROPONIN I, POC: 0.1 ng/mL — AB (ref 0.00–0.08)

## 2017-01-15 MED ORDER — DARBEPOETIN ALFA 100 MCG/0.5ML IJ SOSY: 100.0000 ug | PREFILLED_SYRINGE | INTRAMUSCULAR | Status: DC

## 2017-01-15 MED ORDER — DOXERCALCIFEROL 4 MCG/2ML IV SOLN: 1.0000 ug | INTRAVENOUS | Status: DC

## 2017-01-15 MED ORDER — ASPIRIN 81 MG PO CHEW
324.0000 mg | CHEWABLE_TABLET | Freq: Once | ORAL | Status: AC
  Administered 2017-01-15: 324 mg via ORAL
  Filled 2017-01-15: qty 4

## 2017-01-15 NOTE — ED Notes (Signed)
Pt wants to go home, PT had already pulled out his IV. PT stating he wants to go home and his wife has no idea what she is talking about. MD and wife are with PT talking about options.

## 2017-01-15 NOTE — ED Notes (Signed)
ED Provider at bedside. 

## 2017-01-15 NOTE — Discharge Summary (Signed)
PATIENT LEFT AMA    Name: [name] [name] MRN: [id number] DOB: 05/08/1945 71 y.o. PCP: Doneen PoissonKlima, Lawrence, MD  Date of Admission: 01/15/2017  3:13 PM Date of Discharge: 01/15/2017 Attending Physician: No att. providers found  DischDerrek [name] Diagnosis:  Active Problems:   End stage renal disease on dialysis [facility](HCC)   Volume overload   Discharge Medications: No discharge medications were given due to patient leaving AMA  Disposition and follow-up:   [name] [name] [facility] in Serious condition.  At the hospital follow up visit please address:  1.  PATIENT LEFT AMA. He has not been to dialysis in 9 days. Presented with chest pain and dyspnea with exertion. Patient denied any complaints at the time of my interview and left prior to receiving HD in the hospital or further medical care. Please see H&P dated 01/15/2017 for more details.  2.  Labs / imaging needed at time of follow-up:   3.  Pending labs/ test needing follow-up:   Follow-up Appointments:   Hospital Course by problem list: Active Problems:   End stage renal disease on dialysis (HCC)   Volume overload   1. PATIENT LEFT AMA. He presented with chest pain and shortness of breath with exertion that began earlier today. Had not been to dialysis in 9 days. He had new TWI on EKG with elevated troponin to 0.1, likely NSTEMI in the setting of volume overload. K 5.1, BUN 90, Cr 7.59. He was evaluated by Nephrology in the ED who decided he did not need urgent HD and scheduled him for HD the next day. Upon our interview with patient, he was adamant about leaving. We had an extensive discussion with patient and his wife regarding the risks of leaving. He voiced understanding about the risks of leaving, stating that "If it's my time, it's my time. I'm not afraid of dying". For full details regarding this discussion, please see the H&P dated 01/15/2017. Patient completed AMA discharge paperwork and  left the ED with his wife and 2 daughters.  Discharge Vitals:   BP (!) 167/80   Pulse 62   Temp 97.7 F (36.5 C) (Oral)   Resp 20   SpO2 100%   Pertinent Labs, Studies, and Procedures:  CBC Latest Ref Rng & Units 01/15/2017 11/05/2016 08/20/2016  WBC 4.0 - 10.5 K/uL 6.5 - 6.4  Hemoglobin 13.0 - 17.0 g/dL 4.0(J9.1(L) 81.114.6 10.4(L)  Hematocrit 39.0 - 52.0 % 29.7(L) 43.0 32.4(L)  Platelets 150 - 400 K/uL 121(L) - 104(L)   BMP Latest Ref Rng & Units 01/15/2017 11/05/2016 09/23/2016  Glucose 65 - 99 mg/dL 914(N124(H) 829(F135(H) 621(H169(H)  BUN 6 - 20 mg/dL 08(M90(H) 57(Q41(H) 46(N65(H)  Creatinine 0.61 - 1.24 mg/dL 6.29(B7.59(H) 2.84(X5.80(H) 3.24(M7.11(H)  BUN/Creat Ratio 10 - 22 - - -  Sodium 135 - 145 mmol/L 141 142 141  Potassium 3.5 - 5.1 mmol/L 5.1 3.9 4.4  Chloride 101 - 111 mmol/L 114(H) 97(L) 110  CO2 22 - 32 mmol/L 15(L) - 22  Calcium 8.9 - 10.3 mg/dL 7.8(L) - 8.5(L)   Troponin 0.1 EKG: New TWI  Discharge Instructions:  Signed: Scherrie GerlachHuang, Ermine Spofford, MD 01/15/2017, 8:39 PM

## 2017-01-15 NOTE — ED Provider Notes (Signed)
I saw and evaluated the patient, reviewed the resident's note and I agree with the findings and plan.  Pertinent History: presents with CP - hx of ESRD, He has CP earlier today with SOB, SOB is persistent, missed dialysis today - has ongoing tightness.  No radiation of pain, no diaphoresis and no n/v.  SOB is worse with exertion.  Hx of CAD s/p stents.  Pertinent Exam findings: s/p unilateral AKA - no LE edema, rales bialterally, but no distress.   He has normal fistula on the UE.  No redness, normal thrill.    EKG Interpretation  Date/Time:  Thursday January 15 2017 14:50:47 EDT Ventricular Rate:  66 PR Interval:  172 QRS Duration: 132 QT Interval:  504 QTC Calculation: 528 R Axis:   -22 Text Interpretation:  Normal sinus rhythm Right bundle branch block Left ventricular hypertrophy T wave abnormality, consider inferolateral ischemia Abnormal ECG Since last tracing t wave inversions are still present - deeper in the lateral precordial leads Confirmed by Eber HongMiller, Jameel Quant ([id number]) on 01/15/2017 3:57:26 PM      Has new TWI's and with positive troponin. - has not been positive in the past.    Will give ASA and Heparin, Consult with cardiology.  I personally interpreted the EKG as well as the resident and agree with the interpretation on the resident's chart..  Needs admission  Needs Cardiology consult, Nephrology consult, Hospitalist likely to admit  CRITICAL CARE Performed by: Vida RollerBrian D Shannette Tabares Total critical care time: 35 minutes Critical care time was exclusive of separately billable procedures and treating other patients. Critical care was necessary to treat or prevent imminent or life-threatening deterioration. Critical care was time spent personally by me on the following activities: development of treatment plan with patient and/or surrogate as well as nursing, discussions with consultants, evaluation of patient's response to treatment, examination of patient, obtaining history from  patient or surrogate, ordering and performing treatments and interventions, ordering and review of laboratory studies, ordering and review of radiographic studies, pulse oximetry and re-evaluation of patient's condition.   Final diagnoses:  Hypertension, unspecified type  Dyspnea on exertion  NSTEMI (non-ST elevated myocardial infarction) (HCC)      Eber HongMiller, Lache Dagher, MD 01/18/17 1640

## 2017-01-15 NOTE — ED Notes (Signed)
Called to bedside by Daphine DeutscherMartin, EMT ref: patient refusing care and preparing to leave. Encountered patient sitting at foot of bed, dressing. Currently admitting MDs at bedside discussing plan with patient. MDs have discussed risks of leaving with patient. Patient verbalized understanding of potential risk including possibility of death. Requested patient sign AMA paperwork. Patient refused. States, I can't read or write. Advised patient that I just need him to simply make a mark and I witness it. Patient still refused; states: "I don't need to sign nothing". Signed for patient and witnessed his understanding that he is leaving AMA.

## 2017-01-15 NOTE — ED Provider Notes (Signed)
[facility] EMERGENCY DEPARTMENT Provider Note   CSN: [id number] Arrival date & time: 01/15/17  1425  History   Chief Complaint Chief Complaint  Patient presents with  . Chest Pain  . Shortness of Breath   HPI [name] [name] is a 71 y.o. male who presented with chest pain as detailed below and progressive dyspnea over the past week. The patient has missed dialysis for the past week and has noticed increased swelling in his lower extremity and worsening shortness of breath with exertion.   The history is provided by the patient.  Chest Pain   This is a new problem. The current episode started 3 to 5 hours ago. The problem occurs rarely. The problem has been resolved. The pain is associated with rest. The pain is present in the substernal region. The pain is mild. The quality of the pain is described as pressure-like. The pain does not radiate. Duration of episode(s) is 1 hour. Associated symptoms include lower extremity edema and shortness of breath. Pertinent negatives include no abdominal pain, no back pain, no cough, no diaphoresis, no dizziness, no fever, no hemoptysis, no irregular heartbeat, no leg pain, no malaise/fatigue, no nausea, no near-syncope, no palpitations and no vomiting. He has tried nothing for the symptoms. Risk factors include being elderly.  His past medical history is significant for CAD, CHF, diabetes, hyperlipidemia and hypertension.   Past Medical History:  Diagnosis Date  . Arthritis    "fingers" (02/07/2015)  . Atrial flutter (HCC) 07/26/2014   a. May 2016, CHA2DS2VASc = 4 -> Eliquis, spontaneous conversion to NSR;  b. On amio;  c. 01/2015 EPS: Unable to induce right sided Aflutter. Non-sustained Afib and Left sided Aflutter noted.  . Benign prostatic hyperplasia with elevated prostate specific antigen (PSA) 10/31/2016   PSA (08/2008): 8.38, PSA (05/2009): 6.63, PSA (11/2009): 13.50, PSA (12/2009): 13.70, PSA (12/2010) 11.99, PSA (06/2011):  11.88, PSA (12/2011): 13.93, PSA (12/2014): 13.49 TRUS/BX (10/2008) Prostate 54 cc, Pathology BPH with single core of chronic inflammation  . Benign prostatic hypertrophy with nocturia 07/07/2006  . Cardiomyopathy (HCC)    a. 07/2014 Echo: EF 50%; b. 03/2015 Ech: EF 20-25%; c. 07/2015 Echo: EF 30-35%, Gr1 DD, sev LVH.  [name] Chronic venous insufficiency 04/12/2010  . Closed displaced fracture of left femoral neck (HCC) 06/14/2014   s/p left hip hemiarthroplasty June 14, 2014   . Constipation 05/25/2009   Intermittent   . Coronary artery disease 04/02/2006   a. s/p RCA DES (2.5 x 24 mm TAXUS drug-eluting stent) 2006;  b. low risk myoview in 2011; c. 5/17 MV: EF 28%, large inf apical and inflat infarct w/o ischemia.  . Degenerative joint disease involving multiple joints 02/02/2007  . End stage renal disease on dialysis [facility])    a. TTS Dialysis - currently through right chest diatek, pending AVF.  [name] GERD (gastroesophageal reflux disease)   . Hepatitis    Hep C  . History of blood transfusion 05/2014   "related to hip OR"  . 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 Hyperlipidemia 04/02/2006  . Hypertension   . Hypertensive heart disease 12/09/2006  . Hypothyroidism   . Internal and external hemorrhoids without complication 08/20/2012  . Microcytic normochromic anemia 05/27/2006  . Obesity (BMI 30.0-34.9) 01/15/2012  . Phantom limb pain (HCC) 06/13/2016  . 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 Type 2 diabetes mellitus with peripheral artery disease (HCC) 05/27/2006   Absent pulses in the  left foot   . 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 Benign prostatic hyperplasia with elevated prostate specific antigen (PSA) 10/31/2016  . Long term (current) use of anticoagulants [Z79.01] 07/28/2016  . Phantom limb pain (HCC) 06/13/2016  . 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 Hyperlipidemia 08/10/2015  . End stage renal disease on dialysis (HCC)   . Hypothyroidism, acquired 12/12/2014  . Chronic hepatitis C without hepatic coma (HCC) 12/11/2014  . Aortic atherosclerosis (HCC) 11/30/2014  . Right bundle branch block (RBBB) 07/27/2014  . Paroxysmal atrial flutter (HCC) 07/26/2014  . Gastroesophageal reflux disease without esophagitis 07/01/2013  . Internal and external hemorrhoids without complication 08/20/2012  . Type 2 diabetes mellitus with end-stage renal disease (HCC) 08/06/2012  . Overweight (BMI 25.0-29.9) 01/15/2012  . Healthcare maintenance 12/12/2010  . Chronic venous insufficiency 04/12/2010  . Seasonal allergic rhinitis 05/25/2009  . Degenerative joint disease involving multiple joints 02/02/2007  . Essential hypertension 12/09/2006  . 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 Anemia of chronic renal failure, stage 5 (HCC) 05/27/2006  . Type 2 diabetes mellitus with peripheral artery disease (HCC) 05/27/2006  . Coronary artery disease of native artery of native heart with stable angina pectoris (HCC) 04/02/2006   Past Surgical History:  Procedure Laterality Date  . A/V FISTULAGRAM N/A 11/05/2016   Procedure: A/V Fistulagram Right Arm;  Surgeon: Maeola Harman, MD;  Location: [facility] INVASIVE CV LAB;  Service:  Cardiovascular;  Laterality: N/A;  . AV FISTULA PLACEMENT Left 10/08/2015   Procedure:  BRACHIOCEPHALIC ARTERIOVENOUS (AV) FISTULA CREATION Right Arm;  Surgeon: Fransisco Hertz, MD;  Location: [facility] OR;  Service: Vascular;  Laterality: Left;  . CARDIAC CATHETERIZATION N/A 09/21/2015   Procedure: Left Heart Cath and Coronary Angiography;  Surgeon: Marykay Lex, MD;  Location: [facility] INVASIVE CV LAB;  Service: Cardiovascular;  Laterality: N/A;  . CHOLECYSTECTOMY N/A 12/12/2014   Procedure: LAPAROSCOPIC CHOLECYSTECTOMY WITH INTRAOPERATIVE CHOLANGIOGRAM;  Surgeon: Gaynelle Adu, MD;  Location: [facility] OR;  Service: General;  Laterality: N/A;  . CORONARY ANGIOPLASTY WITH STENT PLACEMENT    . ELECTROPHYSIOLOGIC STUDY N/A 02/07/2015   Procedure: A-Flutter Ablation;  Surgeon: Marinus Maw, MD;  Location: [facility] INVASIVE CV LAB;  Service: Cardiovascular;  Laterality: N/A;  . FRACTURE SURGERY    . INSERTION OF DIALYSIS CATHETER Right 06/13/2015   Procedure: INSERTION OF DIALYSIS CATHETER RIGHT INTERNAL JUGULAR;  Surgeon: Larina Earthly, MD;  Location: [facility] OR;  Service: Vascular;  Laterality: Right;  . JOINT REPLACEMENT    . LEG AMPUTATION ABOVE KNEE Right ~ 2008  . PERIPHERAL VASCULAR INTERVENTION Right 11/05/2016   Procedure: PERIPHERAL VASCULAR INTERVENTION;  Surgeon: Maeola Harman, MD;  Location: [facility] INVASIVE CV LAB;  Service: Cardiovascular;  Laterality: Right;  INNOMINANTE VEIN  . PILONIDAL CYST EXCISION  1990's  . PROSTATE BIOPSY  ~  2013  . REFRACTIVE SURGERY Bilateral   . REMOVAL OF A DIALYSIS CATHETER Right 06/13/2015   Procedure: REMOVAL OF A DIALYSIS CATHETER;  Surgeon: Larina Earthlyodd F Early, MD;  [facility];  Service: Vascular;  Laterality: Right;  . SVC VENOGRAPHY N/A 11/05/2016   Procedure: SVC Venography;  Surgeon: Maeola Harmanain, Brandon Christopher, MD;  [facility];  Service: Cardiovascular;  Laterality: N/A;  . TONSILLECTOMY    . TOTAL HIP ARTHROPLASTY Left 06/14/2014   Procedure: HEMI HIP  ARTHROPLASTY ANTERIOR APPROACH;  Surgeon: Tarry KosNaiping M Xu, MD;  Location: [facility] OR;  Service: Orthopedics;  Laterality: Left;    Home Medications    Prior to Admission medications   Medication Sig Start Date End Date Taking? Authorizing Provider  acetaminophen (TYLENOL) 500 MG tablet Take 500-1,000 mg by mouth 2 (two) times daily as needed for moderate pain.    [provider]  amiodarone (PACERONE) 200 MG tablet Take 1 tablet (200 mg total) by mouth daily. 10/01/15   Doneen PoissonKlima, Lawrence, MD  atorvastatin (LIPITOR) 40 MG tablet Take 1 tablet (40 mg total) by mouth every evening. 12/12/16 03/12/17  Doneen PoissonKlima, Lawrence, MD  calcium citrate-vitamin D (CITRACAL+D) 315-200 MG-UNIT per tablet Take 1 tablet by mouth daily.     [provider]  carvedilol (COREG) 6.25 MG tablet Take 1 tablet (6.25 mg total) by mouth 2 (two) times daily. Patient taking differently: Take 6.25 mg by mouth as directed. Take 6.25 mg twice daily on non dialysis days, take 6.25 mg once daily in the evening on dialysis days (Tue, Thur, Sat) 06/30/16   Valentino NoseBoswell, Nathan, MD  famotidine (PEPCID) 20 MG tablet TAKE 1 TABLET BY MOUTH BEFORE BREAKFAST AND 1 TABLET BEFORE SUPPER 12/31/16   Lewayne Buntingrenshaw, Brian S, MD  fish oil-omega-3 fatty acids 1000 MG capsule Take 1 g by mouth daily. 01/15/12   Doneen PoissonKlima, Lawrence, MD  fluticasone (FLONASE) 50 MCG/ACT nasal spray Place 1 spray into both nostrils daily as needed for allergies. 12/12/16   Doneen PoissonKlima, Lawrence, MD  gabapentin (NEURONTIN) 300 MG capsule Take 1 capsule (300 mg total) by mouth daily as needed (for stump pain). 06/13/16   Doneen PoissonKlima, Lawrence, MD  hydroxypropyl methylcellulose (ISOPTO TEARS) 2.5 % ophthalmic solution Place 2 drops into both eyes daily as needed for dry eyes. Reported on 04/20/2015    [provider]  levothyroxine (SYNTHROID, LEVOTHROID) 25 MCG tablet Take 1 tablet (25 mcg total) by mouth daily before breakfast. 09/17/16   Doneen PoissonKlima, Lawrence, MD  Multiple Vitamins-Minerals  (MULTIVITAMIN WITH MINERALS) tablet Take 1 tablet by mouth daily.    [provider]  ondansetron (ZOFRAN) 4 MG tablet Take 1 tablet (4 mg total) by mouth every 8 (eight) hours as needed for nausea. 03/03/16   Doneen PoissonKlima, Lawrence, MD  warfarin (COUMADIN) 2.5 MG tablet Take 2 tablets all days of the week EXCEPT on Sundays, Wednesdays, and Fridays, take 1 & 1/2 tablets 11/10/16   Gust RungHoffman, Erik C, DO   Family History Family History  Problem Relation Age of Onset  . Hypertension Mother   . Heart attack Father   . Breast cancer Sister   . Arthritis Sister   . Heart attack Brother   . Diabetes Brother   . Stroke Brother   . Pneumonia Daughter   . Diabetes Brother   . Alcoholism Brother   . Diabetes Brother   . Arthritis Sister        Bilateral knee replacement  . HIV Daughter   . Hypertension Daughter   .  Drug abuse Daughter   . Schizophrenia Daughter   . Hypertension Daughter   . Drug abuse Daughter   . Bipolar disorder Daughter    Social History Social History  Substance Use Topics  . Smoking status: Former Smoker    Packs/day: 1.00    Years: 10.00    Types: Cigarettes    Quit date: 06/16/1968  . Smokeless tobacco: Never Used  . Alcohol use No     Comment: "quit alcohol in the 1960's"   Allergies   Lisinopril; Amoxicillin; Pravastatin; Tamsulosin; and Doxycycline  Review of Systems Review of Systems  Constitutional: Negative for chills, diaphoresis, fever and malaise/fatigue.  HENT: Negative for ear pain and sore throat.   Eyes: Negative for pain and visual disturbance.  Respiratory: Positive for shortness of breath. Negative for cough and hemoptysis.   Cardiovascular: Positive for chest pain. Negative for palpitations and near-syncope.  Gastrointestinal: Negative for abdominal pain, nausea and vomiting.  Genitourinary: Negative for dysuria and hematuria.  Musculoskeletal: Negative for arthralgias and back pain.  Skin: Negative for color change and rash.    Neurological: Negative for dizziness and syncope.  All other systems reviewed and are negative.  Physical Exam Updated Vital Signs BP (!) 160/71   Pulse 65   Temp 97.7 F (36.5 C) (Oral)   Resp 16   SpO2 100%   Physical Exam  Constitutional: He is oriented to person, place, and time. He appears well-developed and well-nourished. No distress.  HENT:  Head: Normocephalic and atraumatic.  Eyes: Pupils are equal, round, and reactive to light. Conjunctivae and EOM are normal.  Neck: Normal range of motion. Neck supple.  Cardiovascular: Normal rate and regular rhythm.   No murmur heard. Pulmonary/Chest: Effort normal and breath sounds normal. No respiratory distress. He has no wheezes. He has no rales.  Abdominal: Soft. Bowel sounds are normal. He exhibits no distension. There is no tenderness.  Musculoskeletal: Normal range of motion. He exhibits no edema, tenderness or deformity.  Neurological: He is alert and oriented to person, place, and time. No cranial nerve deficit. He exhibits normal muscle tone.  Skin: Skin is warm and dry. He is not diaphoretic.  Psychiatric: He has a normal mood and affect.  Nursing note and vitals reviewed.  ED Treatments / Results  Labs (all labs ordered are listed, but only abnormal results are displayed) Labs Reviewed  BASIC METABOLIC PANEL - Abnormal; Notable for the following:       Result Value   Chloride 114 (*)    CO2 15 (*)    Glucose, Bld 124 (*)    BUN 90 (*)    Creatinine, Ser 7.59 (*)    Calcium 7.8 (*)    GFR calc non Af Amer 6 (*)    GFR calc Af Amer 7 (*)    All other components within normal limits  CBC - Abnormal; Notable for the following:    RBC 3.89 (*)    Hemoglobin 9.1 (*)    HCT 29.7 (*)    MCV 76.3 (*)    MCH 23.4 (*)    RDW 18.7 (*)    Platelets 121 (*)    All other components within normal limits  I-STAT TROPONIN, ED - Abnormal; Notable for the following:    Troponin i, poc 0.10 (*)    All other components  within normal limits   EKG  EKG Interpretation None      Radiology Dg Chest 2 View  Result Date: 01/15/2017 CLINICAL DATA:  Chest pain.  Shortness of breath EXAM: CHEST  2 VIEW COMPARISON:  08/20/2016 . FINDINGS: Vascular stent noted over the upper right mediastinum. Carotid vascular calcification. Cardiomegaly with mild bilateral pulmonary interstitial prominence and bilateral pleural effusions. Findings suggest mild CHF. IMPRESSION: 1. Mild CHF. 2. Carotid vascular disease. Electronically Signed   By: Maisie Fus  Register   On: 01/15/2017 15:49   Procedures Procedures (including critical care time)  Medications Ordered in ED Medications - No data to display  Initial Impression / Assessment and Plan / ED Course  I have reviewed the triage vital signs and the nursing notes.  Pertinent labs & imaging results that were available during my care of the patient were reviewed by me and considered in my medical decision making (see chart for details).  [name] [name] is a 71 y.o. male who presents with chest pain, dyspnea on exertion, and lower extremity swelling likely secondary to volume overload from missing dialysis for the past week.   Lab studies and imaging ordered:  CBC: without leukocytosis, anemia likely secondary to ESRD, thrombocytopenic however improved compared to previous,  CMP; creatine of 7.59 consistent with ESRD, K normal,  Trop elevated at 0.10  WUJ:[id number] with mild bilateral pulmonary interstitial prominence and bilateral pleural effusions  Consulted nephrology for dialysis and will evaluate the patient.  Consulted cardiology regarding elevated troponin and will evaluate the patient.   Admitted the patient to the hospitalist for further medical management.   Final Clinical Impressions(s) / ED Diagnoses   Final diagnoses:  Hypertension, unspecified type  Dyspnea on exertion  NSTEMI (non-ST elevated myocardial infarction) [facility])   New Prescriptions New  Prescriptions   No medications on file     Lamont Snowball, MD 01/17/17 Lenoria Chime    Eber Hong, MD 01/18/17 1640

## 2017-01-15 NOTE — H&P (Signed)
Date: 01/15/2017               Patient Name:  [name] [name] MRN: [id number]  DOB: Aug 05, 1945 Age / Sex: 71 y.o., male   PCP: Doneen Poisson, MD         Medical Service: Internal Medicine Teaching Service         Attending Physician: Dr. Bonnetta Barry att. providers found    First Contact: Dr. Frances Furbish Pager: [phone]  Second Contact: Dr. Samuella Cota Pager: [phone]       After Hours (After 5p/  First Contact Pager: [phone]  weekends / holidays): Second Contact Pager: [phone]   Chief Complaint: chest pain and shortness of breath  History of Present Illness:  Mr. Caiazzo is a 72yo male with PMH significant for HTN, DM, dementia, PAD s/p R AKA, CAD s/p DES 2006, atrial flutter (on amiodarone and warfarin), and ESRD with HD [facility] who presents with chest pain and shortness of breath. Patient has not been to HD in 9 days due to discontent with his HD center and alleged miscommunication regarding a transfer. Nephrology evaluated him in the ED and determined that he does not need urgent HD tonight and were planning to work on this issue of his outpatient HD center. History was obtained from patient and wife.  On my interview with the patient, he denied any complaints and stated he is going home. He denies chest pain, shortness of breath, fever, leg swelling, abdominal pain, or N/V. Wife does state that he had dyspnea with exertion earlier today while getting into the car. This was associated with cough and chest pain; no N/V or diaphoresis.   Upon further interview with the patient, he appeared upset and was adamant about going home. He was informed frankly that he needs dialysis and without it, he will die. Patient responded, "If it's my time, it's my time. It's up to the Lord. I'm not afraid of dying." Wife was initially insistent on him staying in the hospital for further evaluation and encouraged him to stay. We stepped out of the room to have a conversation with his wife regarding the patient's  ability to make his own medical decisions. She informed us that "he's acting like an 'A'", but she does think that he has the ability to make his own decisions. She told us about having to watch her son die due to similar "fluid overload" issues, and she expressed frustration that she now has to deal with this from her husband. We re-entered the room and patient continued to be adamant about leaving. He removed his IV. Nurse tech assisted [place] in placing a gauze dressing over his IV site. Daughters entered room and were frustrated he was leaving. Patient got more aggravated and at this point, his wife declared she is taking him home and take care of him at home and will keep encouraging him to go to HD. Patient and wife were asked if they knew the consequences of leaving AMA and they both acknowledged the risk of death. Again, the risk of serious consequences was discussed and understood. We asked several times whether the wife feels comfortable taking him home, and she stated that she does and that she wants to take him home. Patient left AMA with wife and 2 daughers.  ED Course - BP 160/71, HR 65, temp 97.7, RR 16, O2 100% on RA. - Hb 9.1, plt 121, K 5.1, bicarb 15, BUN 90, Cr 7.59. Troponin elevated at 0.1. - EKG  with new TWI - Received ASA 324 and heparin - Nephrology and Cardiology consulted.  Meds:  Current Meds  Medication Sig  . acetaminophen (TYLENOL) 500 MG tablet Take 500-1,000 mg by mouth 2 (two) times daily as needed for moderate pain.  [name]. amiodarone (PACERONE) 200 MG tablet Take 1 tablet (200 mg total) by mouth daily.  [name]. atorvastatin (LIPITOR) 40 MG tablet Take 1 tablet (40 mg total) by mouth every evening.  . calcium citrate-vitamin D (CITRACAL+D) 315-200 MG-UNIT per tablet Take 1 tablet by mouth daily.   . carvedilol (COREG) 6.25 MG tablet Take 1 tablet (6.25 mg total) by mouth 2 (two) times daily. (Patient taking differently: Take 6.25 mg by mouth See admin instructions. 6.25 mg TWO  times a day on Sun/Mon/Wed/Fri and 6.25 ONCE a day in the evening on Tues/Thurs/Sat)  . famotidine (PEPCID) 20 MG tablet TAKE 1 TABLET BY MOUTH BEFORE BREAKFAST AND 1 TABLET BEFORE SUPPER (Patient taking differently: Take 20 mg by mouth two times a day before meals (BREAKFAST and SUPPER))  . fish oil-omega-3 fatty acids 1000 MG capsule Take 1,000 mg by mouth daily.   . fluticasone (FLONASE) 50 MCG/ACT nasal spray Place 1 spray into both nostrils daily as needed for allergies.  [name]. gabapentin (NEURONTIN) 300 MG capsule Take 1 capsule (300 mg total) by mouth daily as needed (for stump pain).  . hydroxypropyl methylcellulose (ISOPTO TEARS) 2.5 % ophthalmic solution Place 2 drops into both eyes 3 (three) times daily as needed for dry eyes. Reported on 04/20/2015  . levothyroxine (SYNTHROID, LEVOTHROID) 25 MCG tablet Take 1 tablet (25 mcg total) by mouth daily before breakfast.  . Multiple Vitamins-Minerals (MULTIVITAMIN WITH MINERALS) tablet Take 1 tablet by mouth daily.  . ondansetron (ZOFRAN) 4 MG tablet Take 1 tablet (4 mg total) by mouth every 8 (eight) hours as needed for nausea.  [name]. warfarin (COUMADIN) 2.5 MG tablet Take 2 tablets all days of the week EXCEPT on Sundays, Wednesdays, and Fridays, take 1 & 1/2 tablets (Patient taking differently: Take 5 mg by mouth every evening. )     Allergies: Allergies as of 01/15/2017 - Review Complete 01/15/2017  Allergen Reaction Noted  . Lisinopril Other (See Comments) 11/22/2014  . Amoxicillin Swelling and Other (See Comments) 04/21/2012  . Pravastatin Other (See Comments) 05/26/2014  . Tamsulosin Other (See Comments)   . Doxycycline Rash and Other (See Comments) 12/02/2011  . Tape Rash 01/15/2017   Past Medical History:  Diagnosis Date  . Arthritis    "fingers" (02/07/2015)  . Atrial flutter (HCC) 07/26/2014   a. May 2016, CHA2DS2VASc = 4 -> Eliquis, spontaneous conversion to NSR;  b. On amio;  c. 01/2015 EPS: Unable to induce right sided Aflutter.  Non-sustained Afib and Left sided Aflutter noted.  . Benign prostatic hyperplasia with elevated prostate specific antigen (PSA) 10/31/2016   PSA (08/2008): 8.38, PSA (05/2009): 6.63, PSA (11/2009): 13.50, PSA (12/2009): 13.70, PSA (12/2010) 11.99, PSA (06/2011): 11.88, PSA (12/2011): 13.93, PSA (12/2014): 13.49 TRUS/BX (10/2008) Prostate 54 cc, Pathology BPH with single core of chronic inflammation  . Benign prostatic hypertrophy with nocturia 07/07/2006  . Cardiomyopathy (HCC)    a. 07/2014 Echo: EF 50%; b. 03/2015 Ech: EF 20-25%; c. 07/2015 Echo: EF 30-35%, Gr1 DD, sev LVH.  [name]. Chronic venous insufficiency 04/12/2010  . Closed displaced fracture of left femoral neck (HCC) 06/14/2014   s/p left hip hemiarthroplasty June 14, 2014   . Constipation 05/25/2009   Intermittent   . Coronary artery disease 04/02/2006  a. s/p RCA DES (2.5 x 24 mm TAXUS drug-eluting stent) 2006;  b. low risk myoview in 2011; c. 5/17 MV: EF 28%, large inf apical and inflat infarct w/o ischemia.  . Degenerative joint disease involving multiple joints 02/02/2007  . End stage renal disease on dialysis [facility])    a. TTS Dialysis - currently through right chest diatek, pending AVF.  [name] GERD (gastroesophageal reflux disease)   . Hepatitis    Hep C  . History of blood transfusion 05/2014   "related to hip OR"  . 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 Hyperlipidemia 04/02/2006  . Hypertension   . Hypertensive heart disease 12/09/2006  . Hypothyroidism   . Internal and external hemorrhoids without complication 08/20/2012  . Microcytic normochromic anemia 05/27/2006  . Obesity (BMI 30.0-34.9) 01/15/2012  . Phantom limb pain (HCC) 06/13/2016  . 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 Type  2 diabetes mellitus with ophthalmic manifestations (HCC) 04/02/2006   s/p laser surgery for severe diabetic  bilateral non-proliferative retinopathy (2013)    . Type 2 diabetes mellitus with peripheral artery disease (HCC) 05/27/2006   Absent pulses in the left foot   . Type 2 diabetes mellitus with stage 5 chronic kidney disease (HCC) 08/06/2012    Family History: Family History  Problem Relation Age of Onset  . Hypertension Mother   . Heart attack Father   . Breast cancer Sister   . Arthritis Sister   . Heart attack Brother   . Diabetes Brother   . Stroke Brother   . Pneumonia Daughter   . Diabetes Brother   . Alcoholism Brother   . Diabetes Brother   . Arthritis Sister        Bilateral knee replacement  . HIV Daughter   . Hypertension Daughter   . Drug abuse Daughter   . Schizophrenia Daughter   . Hypertension Daughter   . Drug abuse Daughter   . Bipolar disorder Daughter    Social History:  Social History   Social History  . Marital status: Married    Spouse name: N/A  . Number of children: N/A  . Years of education: N/A   Social History Main Topics  . Smoking status: Former Smoker    Packs/day: 1.00    Years: 10.00    Types: Cigarettes    Quit date: 06/16/1968  . Smokeless tobacco: Never Used  . Alcohol use No     Comment: "quit alcohol in the 1960's"  . Drug use: No  . Sexual activity: Not Currently    Birth control/ protection: None   Other Topics Concern  . None   Social History Narrative  . None   Review of Systems: A complete ROS was negative except as per HPI.  Physical Exam: Blood pressure (!) 167/80, pulse 62, temperature 97.7 F (36.5 C), temperature source Oral, resp. rate 20, SpO2 100 %. Patient left AMA before we were able to complete the physical exam. He was alert, oriented to person, place, year and situation. When asked what might happen if he leaves the hospital this evening against our advice, he states "I might die. I'm not afraid  of dying. If its my time, its my time."  Labs CBC Latest Ref Rng & Units  01/15/2017 11/05/2016 08/20/2016  WBC 4.0 - 10.5 K/uL 6.5 - 6.4  Hemoglobin 13.0 - 17.0 g/dL 1.6(X) 09.6 10.4(L)  Hematocrit 39.0 - 52.0 % 29.7(L) 43.0 32.4(L)  Platelets 150 - 400 K/uL 121(L) - 104(L)   BMP Latest Ref Rng & Units 01/15/2017 11/05/2016 09/23/2016  Glucose 65 - 99 mg/dL 045(W) 098(J) 191(Y)  BUN 6 - 20 mg/dL 78(G) 95(A) 21(H)  Creatinine 0.61 - 1.24 mg/dL 0.86(V) 7.84(O) 9.62(X)  BUN/Creat Ratio 10 - 22 - - -  Sodium 135 - 145 mmol/L 141 142 141  Potassium 3.5 - 5.1 mmol/L 5.1 3.9 4.4  Chloride 101 - 111 mmol/L 114(H) 97(L) 110  CO2 22 - 32 mmol/L 15(L) - 22  Calcium 8.9 - 10.3 mg/dL 7.8(L) - 8.5(L)   Troponin 0.10.  EKG: new TWI  CXR: pulmonary interstitial prominence and bilateral pleural effusions; carotid vascular disease  Assessment & Plan by Problem: Active Problems:   End stage renal disease on dialysis (HCC)   Volume overload  Mr. Girgis is a 71yo male with PMH significant for HTN, DM, dementia, PAD s/p R AKA, CAD s/p DES 2006, atrial flutter (on warfarin), and ESRD with HD [facility] who presents with chest pain and shortness of breath. Patient was evaluated by Nephrology in the ED and was on schedule to receive HD tomorrow. New TWI and positive troponin, concerning for NSTEMI likely secondary to volume overload. Patient was adamant about going home. We had an extensive discussion regarding the patient's risks of going home without medical care (more details included above in HPI). Wife and patient insistent on taking patient home and acknowledged the patient's risk of death and that she will take care of him and continue to encourage his compliance with HD. Patient left AMA with his wife and 2 daughters. They were encouraged to present again to the ED should his mind change and close follow-up with [facility].  Signed: Scherrie Gerlach, MD 01/15/2017, 8:32 PM

## 2017-01-15 NOTE — Consult Note (Signed)
Reason for Consult: To manage dialysis and dialysis related needs Referring Physician: [facility]  [name] [name] is an 71 y.o. male with PMhx DM, HTN, PAD s/p right AKA, ischemic cardiomyopathy and ESRD- TTS at [facility] but pt apparently not happy with that center and had requested a transfer to [place].  The issues are unclear as pt appears to have dementia and is going off on the personnel at the kidney center- possibly issues with cannulation and not taking him off the machine in time to catch his bus home.  The last 2 treatments he attended at [place] were 10/9 and 10/16.  He has not been back since.  According to his wife I guess they thought the transfer was coming so they were just waiting for that.  Pt is not complaining of anything but when IM clinic found out he had not been to HD they suggested he come to the hospital. His BP is high and he is having some objective evidence of SOB- trop 0.1- K of 5.1, BUN of 90 and creatinine near 8   Dialyzes at [place]. 4 hours- profile #4 [place] 2/2, Dialyzer 180, Heparin yes, 3000 bolus. Access right AVF.  hectorol 1 mcg q tx, mircera 100 given 10/9- last hgb 9.8, phos 4.5 and PTH 560  Past Medical History:  Diagnosis Date  . Arthritis    "fingers" (02/07/2015)  . Atrial flutter ([place]) 07/26/2014   a. May 2016, CHA2DS2VASc = 4 -> Eliquis, spontaneous conversion to NSR;  b. On amio;  c. 01/2015 EPS: Unable to induce right sided Aflutter. Non-sustained Afib and Left sided Aflutter noted.  . Benign prostatic hyperplasia with elevated prostate specific antigen (PSA) 10/31/2016   PSA (08/2008): 8.38, PSA (05/2009): 6.63, PSA (11/2009): 13.50, PSA (12/2009): 13.70, PSA (12/2010) 11.99, PSA (06/2011): 11.88, PSA (12/2011): 13.93, PSA (12/2014): 13.49 TRUS/BX (10/2008) Prostate 54 cc, Pathology BPH with single core of chronic inflammation  . Benign prostatic hypertrophy with nocturia 07/07/2006  . Cardiomyopathy ([place])    a. 07/2014 Echo: EF 50%; b.  03/2015 Ech: EF 20-25%; c. 07/2015 Echo: EF 30-35%, Gr1 DD, sev LVH.  [name] Chronic venous insufficiency 04/12/2010  . Closed displaced fracture of left femoral neck ([place]) 06/14/2014   s/p left hip hemiarthroplasty June 14, 2014   . Constipation 05/25/2009   Intermittent   . Coronary artery disease 04/02/2006   a. s/p RCA DES (2.5 x 24 mm TAXUS drug-eluting stent) 2006;  b. low risk myoview in 2011; c. 5/17 MV: EF 28%, large inf apical and inflat infarct w/o ischemia.  . Degenerative joint disease involving multiple joints 02/02/2007  . End stage renal disease on dialysis [facility])    a. TTS Dialysis - currently through right chest diatek, pending AVF.  [name] GERD (gastroesophageal reflux disease)   . Hepatitis    Hep C  . History of blood transfusion 05/2014   "related to hip OR"  . Hx of AKA (above knee amputation), right ([place]) 06/12/2016  . Hyperlipidemia 04/02/2006  . Hypertension   . Hypertensive heart disease 12/09/2006  . Hypothyroidism   . Internal and external hemorrhoids without complication 0/96/2836  . Microcytic normochromic anemia 05/27/2006  . Obesity (BMI 30.0-34.9) 01/15/2012  . Phantom limb pain ([place]) 06/13/2016  . 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 Type 2 diabetes mellitus with neurological manifestations ([place]) 04/02/2006  Neuropathy of the left foot   . Type 2 diabetes mellitus with ophthalmic manifestations ([place]) 04/02/2006   s/p laser surgery for severe diabetic  bilateral non-proliferative retinopathy (2013)    . Type 2 diabetes mellitus with peripheral artery disease ([place]) 05/27/2006   Absent pulses in the left foot   . Type 2 diabetes mellitus with stage 5 chronic kidney disease ([place]) 08/06/2012    Past Surgical History:  Procedure Laterality Date  . A/V FISTULAGRAM N/A 11/05/2016   Procedure: A/V Fistulagram Right Arm;  Surgeon: Waynetta Sandy, MD;  Location: [place] [facility];  Service: Cardiovascular;  Laterality: N/A;  . AV FISTULA PLACEMENT Left 10/08/2015   Procedure:  BRACHIOCEPHALIC ARTERIOVENOUS (AV) FISTULA CREATION Right Arm;  Surgeon: Conrad Bad Axe, MD;  Location: [place];  Service: Vascular;  Laterality: Left;  . CARDIAC CATHETERIZATION N/A 09/21/2015   Procedure: Left Heart Cath and Coronary Angiography;  Surgeon: Leonie Man, MD;  Location: [place] [facility];  Service: Cardiovascular;  Laterality: N/A;  . CHOLECYSTECTOMY N/A 12/12/2014   Procedure: LAPAROSCOPIC CHOLECYSTECTOMY WITH INTRAOPERATIVE CHOLANGIOGRAM;  Surgeon: Greer Pickerel, MD;  Location: [place];  Service: General;  Laterality: N/A;  . CORONARY ANGIOPLASTY WITH STENT PLACEMENT    . ELECTROPHYSIOLOGIC STUDY N/A 02/07/2015   Procedure: A-Flutter Ablation;  Surgeon: Evans Lance, MD;  Location: [place] [facility];  Service: Cardiovascular;  Laterality: N/A;  . FRACTURE SURGERY    . INSERTION OF DIALYSIS CATHETER Right 06/13/2015   Procedure: INSERTION OF DIALYSIS CATHETER RIGHT INTERNAL JUGULAR;  Surgeon: Rosetta Posner, MD;  Location: [place];  Service: Vascular;  Laterality: Right;  . JOINT REPLACEMENT    . LEG AMPUTATION ABOVE KNEE Right ~ 2008  . PERIPHERAL VASCULAR INTERVENTION Right 11/05/2016   Procedure: PERIPHERAL VASCULAR INTERVENTION;  Surgeon: Waynetta Sandy, MD;  Location: [place] [facility];  Service: Cardiovascular;  Laterality: Right;  INNOMINANTE VEIN  . PILONIDAL CYST EXCISION  1990's  . PROSTATE BIOPSY  ~ 2013  . REFRACTIVE SURGERY Bilateral   . REMOVAL OF A DIALYSIS CATHETER Right 06/13/2015   Procedure: REMOVAL OF A DIALYSIS CATHETER;  Surgeon: Rosetta Posner, MD;  Location: [place];  Service: Vascular;  Laterality: Right;  . SVC VENOGRAPHY N/A 11/05/2016   Procedure: SVC Venography;  Surgeon: Waynetta Sandy, MD;  Location: [place] [facility];  Service: Cardiovascular;  Laterality: N/A;  . TONSILLECTOMY    . TOTAL  HIP ARTHROPLASTY Left 06/14/2014   Procedure: HEMI HIP ARTHROPLASTY ANTERIOR APPROACH;  Surgeon: Leandrew Koyanagi, MD;  Location: [place];  Service: Orthopedics;  Laterality: Left;    Family History  Problem Relation Age of Onset  . Hypertension Mother   . Heart attack Father   . Breast cancer Sister   . Arthritis Sister   . Heart attack Brother   . Diabetes Brother   . Stroke Brother   . Pneumonia Daughter   . Diabetes Brother   . Alcoholism Brother   . Diabetes Brother   . Arthritis Sister        Bilateral knee replacement  . HIV Daughter   . Hypertension Daughter   . Drug abuse Daughter   . Schizophrenia Daughter   . Hypertension Daughter   . Drug abuse Daughter   . Bipolar disorder Daughter     Social History:  reports that he quit smoking about 48 years ago. His smoking use included Cigarettes. He has a 10.00 pack-year smoking history. He has  never used smokeless tobacco. He reports that he does not drink alcohol or use drugs.  Allergies:  Allergies  Allergen Reactions  . Lisinopril Other (See Comments)    Acute kidney injury  . Amoxicillin Swelling and Other (See Comments)    Puffy eyes & abdominal pain Has patient had a PCN reaction causing immediate rash, facial/tongue/throat swelling, SOB or lightheadedness with hypotension: Yes Has patient had a PCN reaction causing severe rash involving mucus membranes or skin necrosis: Unknown Has patient had a PCN reaction that required hospitalization: Unknown Has patient had a PCN reaction occurring within the last 10 years: No    . Pravastatin Other (See Comments)    Weakness and fatigue  . Tamsulosin Other (See Comments)    dry throat, sweating, blurred vision  . Doxycycline Rash and Other (See Comments)    Questionable drug rxn rash  . Tape Rash    Medical tape     Medications: I have reviewed the patient's current medications.   Results for orders placed or performed during the hospital encounter of 01/15/17 (from  the past 48 hour(s))  Basic metabolic panel     Status: Abnormal   Collection Time: 01/15/17  2:54 PM  Result Value Ref Range   Sodium 141 135 - 145 mmol/L   Potassium 5.1 3.5 - 5.1 mmol/L   Chloride 114 (H) 101 - 111 mmol/L   CO2 15 (L) 22 - 32 mmol/L   Glucose, Bld 124 (H) 65 - 99 mg/dL   BUN 90 (H) 6 - 20 mg/dL   Creatinine, Ser 7.59 (H) 0.61 - 1.24 mg/dL   Calcium 7.8 (L) 8.9 - 10.3 mg/dL   GFR calc non Af Amer 6 (L) >60 mL/min   GFR calc Af Amer 7 (L) >60 mL/min    Comment: (NOTE) The eGFR has been calculated using the CKD EPI equation. This calculation has not been validated in all clinical situations. eGFR's persistently <60 mL/min signify possible Chronic Kidney Disease.    Anion gap 12 5 - 15  CBC     Status: Abnormal   Collection Time: 01/15/17  2:54 PM  Result Value Ref Range   WBC 6.5 4.0 - 10.5 K/uL   RBC 3.89 (L) 4.22 - 5.81 MIL/uL   Hemoglobin 9.1 (L) 13.0 - 17.0 g/dL   HCT 29.7 (L) 39.0 - 52.0 %   MCV 76.3 (L) 78.0 - 100.0 fL   MCH 23.4 (L) 26.0 - 34.0 pg   MCHC 30.6 30.0 - 36.0 g/dL   RDW 18.7 (H) 11.5 - 15.5 %   Platelets 121 (L) 150 - 400 K/uL  I-stat troponin, ED     Status: Abnormal   Collection Time: 01/15/17  2:59 PM  Result Value Ref Range   Troponin i, poc 0.10 (HH) 0.00 - 0.08 ng/mL   Comment NOTIFIED PHYSICIAN    Comment 3            Comment: Due to the release kinetics of cTnI, a negative result within the first hours of the onset of symptoms does not rule out myocardial infarction with certainty. If myocardial infarction is still suspected, repeat the test at appropriate intervals.     Dg Chest 2 View  Result Date: 01/15/2017 CLINICAL DATA:  Chest pain.  Shortness of breath EXAM: CHEST  2 VIEW COMPARISON:  08/20/2016 . FINDINGS: Vascular stent noted over the upper right mediastinum. Carotid vascular calcification. Cardiomegaly with mild bilateral pulmonary interstitial prominence and bilateral pleural effusions. Findings suggest mild  CHF. IMPRESSION: 1. Mild CHF. 2. Carotid vascular disease. Electronically Signed   By: Marcello Moores  Register   On: 01/15/2017 15:49    ROS: he is without complaint but also does not know the year.  Wife is not surprised by this so this may be his baseline Blood pressure (!) 170/83, pulse 65, temperature 97.7 F (36.5 C), temperature source Oral, resp. rate 16, SpO2 100 %. General appearance: alert and distracted Resp: diminished breath sounds bibasilar Cardio: regular rate and rhythm, S1, S2 normal, no murmur, click, rub or gallop GI: soft, non-tender; bowel sounds normal; no masses,  no organomegaly Extremities: edema 1 plus- on right there is a AKA with prosthesis Neurologic: Mental status: Alert, oriented, thought content appropriate, HE IS NOT ORIENTED  right upper arm AVF- patent  Assessment/Plan: 71 year old BM with multiple medical issues including ESRD- he has not received maintenance HD in 9 days 1 missed HD- actually his labs do not look terrible for missing so much HD- there is not need for emergent HD tonight after hours, but will run tomorrow first shift and on Saturday as well.  The patient and his wife seem to think that the [place] unit is not for them and strongly desire a transfer to the [place] unit.  We will do our best to resolve this issue to the best of our abilities 2 ESRD: plan for HD tomorrow via AVF and Saturday on schedule 3 Hypertension: BP high and is volume overloaded due to above 4. Anemia of ESRD: received Mircera on 10/9- will dose with aranesp here tomorrow 5. Metabolic Bone Disease: labs at OP unit - phos 4.5 and PTH 560 - cont hectorol - no binder listed on med list 6. Dementia- could be a big issue- ie not sure we will find an HD unit that will suit    Samyukta Cura A 01/15/2017, 5:19 PM

## 2017-01-15 NOTE — Telephone Encounter (Signed)
Pt's wife walks in this am to [facility] will now need a note from "his doctor" in order to receive dialysis-this may be in part to patient missing 3 consecutive visits for dialysis.  Pt's wife is requesting an appt for today here in the clinic, because pt is starting to "puff up" and is not feeling well.  Pt's wife was advised to take him to the ED for evaluation and possible admission and/or dialysis.  She is not sure if patient will be in agreement, but will attempt to convince him to come to ED.  Will make pcp aware.Kingsley SpittleGoldston, Amad Mau Cassady10/25/20182:27 PM

## 2017-01-15 NOTE — ED Notes (Signed)
Italyhad Grose RN notified of I stat troponin results.

## 2017-01-15 NOTE — ED Triage Notes (Signed)
To ED for eval of sob and cp - none now. Pt has missed 2 dialysis treatments due to not feeling well. Appears in nad.

## 2017-01-19 ENCOUNTER — Emergency Department (HOSPITAL_COMMUNITY): Payer: Medicare Other

## 2017-01-19 ENCOUNTER — Ambulatory Visit: Payer: Medicare Other

## 2017-01-19 ENCOUNTER — Inpatient Hospital Stay (HOSPITAL_COMMUNITY)
Admission: EM | Admit: 2017-01-19 | Discharge: 2017-01-20 | DRG: 291 | Disposition: A | Discharge status: Home / Self Care | Payer: Medicare Other | Attending: Internal Medicine | Admitting: Internal Medicine

## 2017-01-19 ENCOUNTER — Encounter (HOSPITAL_COMMUNITY): Payer: Self-pay

## 2017-01-19 DIAGNOSIS — N401 Enlarged prostate with lower urinary tract symptoms: Secondary | ICD-10-CM | POA: Diagnosis not present

## 2017-01-19 DIAGNOSIS — Z89619 Acquired absence of unspecified leg above knee: Secondary | ICD-10-CM

## 2017-01-19 DIAGNOSIS — Z833 Family history of diabetes mellitus: Secondary | ICD-10-CM

## 2017-01-19 DIAGNOSIS — E1139 Type 2 diabetes mellitus with other diabetic ophthalmic complication: Secondary | ICD-10-CM | POA: Diagnosis present

## 2017-01-19 DIAGNOSIS — Z811 Family history of alcohol abuse and dependence: Secondary | ICD-10-CM

## 2017-01-19 DIAGNOSIS — K219 Gastro-esophageal reflux disease without esophagitis: Secondary | ICD-10-CM | POA: Diagnosis not present

## 2017-01-19 DIAGNOSIS — B1920 Unspecified viral hepatitis C without hepatic coma: Secondary | ICD-10-CM | POA: Diagnosis not present

## 2017-01-19 DIAGNOSIS — R531 Weakness: Secondary | ICD-10-CM

## 2017-01-19 DIAGNOSIS — Z818 Family history of other mental and behavioral disorders: Secondary | ICD-10-CM

## 2017-01-19 DIAGNOSIS — Z888 Allergy status to other drugs, medicaments and biological substances status: Secondary | ICD-10-CM

## 2017-01-19 DIAGNOSIS — G9340 Encephalopathy, unspecified: Secondary | ICD-10-CM | POA: Diagnosis not present

## 2017-01-19 DIAGNOSIS — Z96642 Presence of left artificial hip joint: Secondary | ICD-10-CM | POA: Diagnosis present

## 2017-01-19 DIAGNOSIS — R972 Elevated prostate specific antigen [PSA]: Secondary | ICD-10-CM | POA: Diagnosis not present

## 2017-01-19 DIAGNOSIS — E1122 Type 2 diabetes mellitus with diabetic chronic kidney disease: Secondary | ICD-10-CM | POA: Diagnosis present

## 2017-01-19 DIAGNOSIS — M19041 Primary osteoarthritis, right hand: Secondary | ICD-10-CM | POA: Diagnosis present

## 2017-01-19 DIAGNOSIS — Z91048 Other nonmedicinal substance allergy status: Secondary | ICD-10-CM

## 2017-01-19 DIAGNOSIS — M19042 Primary osteoarthritis, left hand: Secondary | ICD-10-CM | POA: Diagnosis not present

## 2017-01-19 DIAGNOSIS — Z823 Family history of stroke: Secondary | ICD-10-CM

## 2017-01-19 DIAGNOSIS — I4892 Unspecified atrial flutter: Secondary | ICD-10-CM | POA: Diagnosis not present

## 2017-01-19 DIAGNOSIS — N186 End stage renal disease: Secondary | ICD-10-CM | POA: Diagnosis not present

## 2017-01-19 DIAGNOSIS — E039 Hypothyroidism, unspecified: Secondary | ICD-10-CM | POA: Diagnosis present

## 2017-01-19 DIAGNOSIS — D649 Anemia, unspecified: Secondary | ICD-10-CM | POA: Diagnosis present

## 2017-01-19 DIAGNOSIS — I2510 Atherosclerotic heart disease of native coronary artery without angina pectoris: Secondary | ICD-10-CM | POA: Diagnosis not present

## 2017-01-19 DIAGNOSIS — E785 Hyperlipidemia, unspecified: Secondary | ICD-10-CM | POA: Diagnosis present

## 2017-01-19 DIAGNOSIS — J9811 Atelectasis: Secondary | ICD-10-CM | POA: Diagnosis present

## 2017-01-19 DIAGNOSIS — I4891 Unspecified atrial fibrillation: Secondary | ICD-10-CM | POA: Diagnosis present

## 2017-01-19 DIAGNOSIS — Z881 Allergy status to other antibiotic agents status: Secondary | ICD-10-CM

## 2017-01-19 DIAGNOSIS — N19 Unspecified kidney failure: Secondary | ICD-10-CM | POA: Diagnosis present

## 2017-01-19 DIAGNOSIS — E1149 Type 2 diabetes mellitus with other diabetic neurological complication: Secondary | ICD-10-CM | POA: Diagnosis present

## 2017-01-19 DIAGNOSIS — E113299 Type 2 diabetes mellitus with mild nonproliferative diabetic retinopathy without macular edema, unspecified eye: Secondary | ICD-10-CM | POA: Diagnosis present

## 2017-01-19 DIAGNOSIS — I5043 Acute on chronic combined systolic (congestive) and diastolic (congestive) heart failure: Secondary | ICD-10-CM | POA: Diagnosis not present

## 2017-01-19 DIAGNOSIS — I132 Hypertensive heart and chronic kidney disease with heart failure and with stage 5 chronic kidney disease, or end stage renal disease: Principal | ICD-10-CM | POA: Diagnosis present

## 2017-01-19 DIAGNOSIS — R011 Cardiac murmur, unspecified: Secondary | ICD-10-CM | POA: Diagnosis present

## 2017-01-19 DIAGNOSIS — I255 Ischemic cardiomyopathy: Secondary | ICD-10-CM | POA: Diagnosis present

## 2017-01-19 DIAGNOSIS — Z9115 Patient's noncompliance with renal dialysis: Secondary | ICD-10-CM

## 2017-01-19 DIAGNOSIS — F0390 Unspecified dementia without behavioral disturbance: Secondary | ICD-10-CM | POA: Diagnosis present

## 2017-01-19 DIAGNOSIS — R351 Nocturia: Secondary | ICD-10-CM | POA: Diagnosis present

## 2017-01-19 DIAGNOSIS — Z7901 Long term (current) use of anticoagulants: Secondary | ICD-10-CM

## 2017-01-19 DIAGNOSIS — Z992 Dependence on renal dialysis: Secondary | ICD-10-CM | POA: Diagnosis not present

## 2017-01-19 DIAGNOSIS — E1141 Type 2 diabetes mellitus with diabetic mononeuropathy: Secondary | ICD-10-CM | POA: Diagnosis present

## 2017-01-19 DIAGNOSIS — Z8261 Family history of arthritis: Secondary | ICD-10-CM

## 2017-01-19 DIAGNOSIS — Z87891 Personal history of nicotine dependence: Secondary | ICD-10-CM

## 2017-01-19 DIAGNOSIS — E1151 Type 2 diabetes mellitus with diabetic peripheral angiopathy without gangrene: Secondary | ICD-10-CM | POA: Diagnosis present

## 2017-01-19 DIAGNOSIS — I700 Atherosclerosis of aorta: Secondary | ICD-10-CM | POA: Diagnosis present

## 2017-01-19 DIAGNOSIS — Z9049 Acquired absence of other specified parts of digestive tract: Secondary | ICD-10-CM

## 2017-01-19 DIAGNOSIS — Z8249 Family history of ischemic heart disease and other diseases of the circulatory system: Secondary | ICD-10-CM

## 2017-01-19 DIAGNOSIS — Z803 Family history of malignant neoplasm of breast: Secondary | ICD-10-CM

## 2017-01-19 LAB — CBC
HEMATOCRIT: 30.9 % — AB (ref 39.0–52.0)
HEMOGLOBIN: 9.8 g/dL — AB (ref 13.0–17.0)
MCH: 24.3 pg — ABNORMAL LOW (ref 26.0–34.0)
MCHC: 31.7 g/dL (ref 30.0–36.0)
MCV: 76.5 fL — AB (ref 78.0–100.0)
Platelets: 199 10*3/uL (ref 150–400)
RBC: 4.04 MIL/uL — ABNORMAL LOW (ref 4.22–5.81)
RDW: 18 % — ABNORMAL HIGH (ref 11.5–15.5)
WBC: 7 10*3/uL (ref 4.0–10.5)

## 2017-01-19 LAB — URINALYSIS, ROUTINE W REFLEX MICROSCOPIC
BACTERIA UA: NONE SEEN
BILIRUBIN URINE: NEGATIVE
Glucose, UA: NEGATIVE mg/dL
Hgb urine dipstick: NEGATIVE
KETONES UR: 5 mg/dL — AB
LEUKOCYTES UA: NEGATIVE
Nitrite: NEGATIVE
PROTEIN: 100 mg/dL — AB
Specific Gravity, Urine: 1.014 (ref 1.005–1.030)
pH: 5 (ref 5.0–8.0)

## 2017-01-19 LAB — BRAIN NATRIURETIC PEPTIDE

## 2017-01-19 LAB — BASIC METABOLIC PANEL
Anion gap: 16 — ABNORMAL HIGH (ref 5–15)
BUN: 101 mg/dL — AB (ref 6–20)
CALCIUM: 8.2 mg/dL — AB (ref 8.9–10.3)
CHLORIDE: 114 mmol/L — AB (ref 101–111)
CO2: 12 mmol/L — AB (ref 22–32)
CREATININE: 8.48 mg/dL — AB (ref 0.61–1.24)
GFR calc Af Amer: 6 mL/min — ABNORMAL LOW (ref >60)
GFR calc non Af Amer: 5 mL/min — ABNORMAL LOW (ref >60)
Glucose, Bld: 102 mg/dL — ABNORMAL HIGH (ref 65–99)
Potassium: 5.5 mmol/L — ABNORMAL HIGH (ref 3.5–5.1)
SODIUM: 142 mmol/L (ref 135–145)

## 2017-01-19 LAB — TROPONIN I: TROPONIN I: 0.12 ng/mL — AB (ref <0.03)

## 2017-01-19 LAB — PROTIME-INR
INR: 5.31 — AB
PROTHROMBIN TIME: 47.5 s — AB (ref 11.4–15.2)
PROTHROMBIN TIME: 48.3 s — AB (ref 11.4–15.2)

## 2017-01-19 LAB — CBG MONITORING, ED: Glucose-Capillary: 99 mg/dL (ref 65–99)

## 2017-01-19 LAB — GLUCOSE, CAPILLARY: Glucose-Capillary: 105 mg/dL — ABNORMAL HIGH (ref 65–99)

## 2017-01-19 MED ORDER — CALCIUM CARBONATE-VITAMIN D 500-200 MG-UNIT PO TABS
1.0000 | ORAL_TABLET | Freq: Every day | ORAL | Status: DC
  Administered 2017-01-20: 1 via ORAL
  Filled 2017-01-19: qty 1

## 2017-01-19 MED ORDER — LEVOTHYROXINE SODIUM 25 MCG PO TABS
25.0000 ug | ORAL_TABLET | Freq: Every day | ORAL | Status: DC
  Administered 2017-01-20: 25 ug via ORAL
  Filled 2017-01-19: qty 1

## 2017-01-19 MED ORDER — LORAZEPAM 2 MG/ML IJ SOLN
1.0000 mg | Freq: Once | INTRAMUSCULAR | Status: AC
  Administered 2017-01-19: 1 mg via INTRAVENOUS
  Filled 2017-01-19: qty 1

## 2017-01-19 MED ORDER — ATORVASTATIN CALCIUM 40 MG PO TABS
40.0000 mg | ORAL_TABLET | Freq: Every evening | ORAL | Status: DC
  Administered 2017-01-19: 40 mg via ORAL
  Filled 2017-01-19 (×2): qty 1

## 2017-01-19 MED ORDER — AMIODARONE HCL 200 MG PO TABS
200.0000 mg | ORAL_TABLET | Freq: Every day | ORAL | Status: DC
  Administered 2017-01-19 – 2017-01-20 (×2): 200 mg via ORAL
  Filled 2017-01-19 (×2): qty 1

## 2017-01-19 MED ORDER — HEPARIN SODIUM (PORCINE) 5000 UNIT/ML IJ SOLN: 5000.0000 [IU] | Freq: Three times a day (TID) | INTRAMUSCULAR | Status: DC

## 2017-01-19 MED ORDER — DARBEPOETIN ALFA 100 MCG/0.5ML IJ SOSY
100.0000 ug | PREFILLED_SYRINGE | INTRAMUSCULAR | Status: DC
  Administered 2017-01-20: 100 ug via INTRAVENOUS

## 2017-01-19 MED ORDER — FAMOTIDINE 20 MG PO TABS
20.0000 mg | ORAL_TABLET | Freq: Two times a day (BID) | ORAL | Status: DC
  Administered 2017-01-19 – 2017-01-20 (×2): 20 mg via ORAL
  Filled 2017-01-19 (×2): qty 1

## 2017-01-19 MED ORDER — ASPIRIN 81 MG PO CHEW
324.0000 mg | CHEWABLE_TABLET | Freq: Once | ORAL | Status: AC
  Administered 2017-01-19: 324 mg via ORAL
  Filled 2017-01-19: qty 4

## 2017-01-19 MED ORDER — ADULT MULTIVITAMIN W/MINERALS CH: 1.0000 | ORAL_TABLET | Freq: Every day | ORAL | Status: DC

## 2017-01-19 MED ORDER — NITROGLYCERIN 0.4 MG SL SUBL
0.4000 mg | SUBLINGUAL_TABLET | SUBLINGUAL | Status: DC | PRN
  Administered 2017-01-19 (×2): 0.4 mg via SUBLINGUAL
  Filled 2017-01-19: qty 1

## 2017-01-19 MED ORDER — DOXERCALCIFEROL 4 MCG/2ML IV SOLN
1.0000 ug | INTRAVENOUS | Status: DC
  Administered 2017-01-20: 1 ug via INTRAVENOUS

## 2017-01-19 MED ORDER — ACETAMINOPHEN 650 MG RE SUPP: 650.0000 mg | Freq: Four times a day (QID) | RECTAL | Status: DC | PRN

## 2017-01-19 MED ORDER — ACETAMINOPHEN 325 MG PO TABS: 650.0000 mg | ORAL_TABLET | Freq: Four times a day (QID) | ORAL | Status: DC | PRN

## 2017-01-19 NOTE — ED Triage Notes (Signed)
Per Pt, Pt reports waking up feeling dizzy and generally fatigued. Pt has slight droop to the left face and pt's wife states, "It looks like his face is swollen a little bit since he needs dialysis." Pt has no unilateral weakness. Pt is oriented to self, situation, and place, but is not oriented to time. Wife states this has been "happening for a little bit." Hx of being on Coumadin.

## 2017-01-19 NOTE — ED Triage Notes (Addendum)
Per [facility], Pt is coming from home for generalized weakness and illness. Reports some dizziness. Hx of Dialysis MWF with fistula to the right arm and did not report on Friday. No neuro deficits noted and CBG 107. Denies CP or SOB. EKG noted to have RBB. Vitals per EMS: 166/87, 72 NSR.

## 2017-01-19 NOTE — ED Notes (Signed)
Pt refused 3rd nitro stating his pain is the same but he is going home

## 2017-01-19 NOTE — Progress Notes (Signed)
ANTICOAGULATION CONSULT NOTE - Initial Consult  Pharmacy Consult for heparin Indication: chest pain/ACS  Allergies  Allergen Reactions  . Lisinopril Other (See Comments)    Acute kidney injury  . Amoxicillin Swelling and Other (See Comments)    Puffy eyes & abdominal pain Has patient had a PCN reaction causing immediate rash, facial/tongue/throat swelling, SOB or lightheadedness with hypotension: Yes Has patient had a PCN reaction causing severe rash involving mucus membranes or skin necrosis: Unknown Has patient had a PCN reaction that required hospitalization: Unknown Has patient had a PCN reaction occurring within the last 10 years: No    . Pravastatin Other (See Comments)    Weakness and fatigue  . Tamsulosin Other (See Comments)    dry throat, sweating, blurred vision  . Doxycycline Rash and Other (See Comments)    Questionable drug rxn rash  . Tape Rash    Medical tape     Patient Measurements: Height: 5\' 9"  (175.3 cm) Weight: 172 lb (78 kg) IBW/kg (Calculated) : 70.7 Heparin Dosing Weight: 78kg   Vital Signs: Temp: 97.7 F (36.5 C) (10/29 0718) Temp Source: Oral (10/29 0718) BP: 153/83 (10/29 1600) Pulse Rate: 64 (10/29 1600)  Labs:  Recent Labs  01/19/17 0749 01/19/17 1113 01/19/17 1437 01/19/17 1632  HGB 9.8*  --   --   --   HCT 30.9*  --   --   --   PLT 199  --   --   --   LABPROT  --   --  47.5* 48.3*  INR  --   --   --  5.31*  CREATININE 8.48*  --   --   --   TROPONINI  --  0.12*  --   --     Estimated Creatinine Clearance: 7.8 mL/min (A) (by C-G formula based on SCr of 8.48 mg/dL (H)).   Medical History: Past Medical History:  Diagnosis Date  . Arthritis    "fingers" (02/07/2015)  . Atrial flutter (HCC) 07/26/2014   a. May 2016, CHA2DS2VASc = 4 -> Eliquis, spontaneous conversion to NSR;  b. On amio;  c. 01/2015 EPS: Unable to induce right sided Aflutter. Non-sustained Afib and Left sided Aflutter noted.  . Benign prostatic hyperplasia  with elevated prostate specific antigen (PSA) 10/31/2016   PSA (08/2008): 8.38, PSA (05/2009): 6.63, PSA (11/2009): 13.50, PSA (12/2009): 13.70, PSA (12/2010) 11.99, PSA (06/2011): 11.88, PSA (12/2011): 13.93, PSA (12/2014): 13.49 TRUS/BX (10/2008) Prostate 54 cc, Pathology BPH with single core of chronic inflammation  . Benign prostatic hypertrophy with nocturia 07/07/2006  . Cardiomyopathy (HCC)    a. 07/2014 Echo: EF 50%; b. 03/2015 Ech: EF 20-25%; c. 07/2015 Echo: EF 30-35%, Gr1 DD, sev LVH.  [name]. Chronic venous insufficiency 04/12/2010  . Closed displaced fracture of left femoral neck (HCC) 06/14/2014   s/p left hip hemiarthroplasty June 14, 2014   . Constipation 05/25/2009   Intermittent   . Coronary artery disease 04/02/2006   a. s/p RCA DES (2.5 x 24 mm TAXUS drug-eluting stent) 2006;  b. low risk myoview in 2011; c. 5/17 MV: EF 28%, large inf apical and inflat infarct w/o ischemia.  . Degenerative joint disease involving multiple joints 02/02/2007  . End stage renal disease on dialysis [facility](HCC)    a. TTS Dialysis - currently through right chest diatek, pending AVF.  [name]. GERD (gastroesophageal reflux disease)   . Hepatitis    Hep C  . History of blood transfusion 05/2014   "related to hip OR"  .  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 Hyperlipidemia 04/02/2006  . Hypertension   . Hypertensive heart disease 12/09/2006  . Hypothyroidism   . Internal and external hemorrhoids without complication 08/20/2012  . Microcytic normochromic anemia 05/27/2006  . Obesity (BMI 30.0-34.9) 01/15/2012  . Phantom limb pain (HCC) 06/13/2016  . 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 Type 2 diabetes mellitus with peripheral artery disease (HCC) 05/27/2006   Absent pulses in the left foot   . 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  Hgb 9.8, Hct 30.9, Plt 199. INR 5.3. PMH of Afib, on Warfarin 5mg  every evening.    Plan:  Given patients elevated INR, heparin and warfarin should be HELD at this time. INR should be rechecked tomorrow to determine reinitiation of anticoagulant therapy.   Emeline General, PharmD Candidate.  01/19/2017,5:14 PM

## 2017-01-19 NOTE — ED Notes (Signed)
ED Provider at bedside. 

## 2017-01-19 NOTE — Progress Notes (Signed)
[facility] Progress Note  Interval History: [name] [name] is an 71 y.o. male with ESRD on HD, DM, HTN, PAD s/p right BKA, ischemic cardiomyopathy, A flutter, Hep C. Dialyzes [facility] T[facility]. He was admitted on 01/15/17 with chest pain and dyspnea after having missed 4 previous dialysis treatments. He left AMA without receiving dialysis.  His last dialysis was 01/06/17. He has now missed 5 treatments.   Seen in the ED sedated. History from his wife. His wife says he has been "going downhill" for the last 2-3 days with nausea, confusion, loss of appetite, and chest pain. She says she brought him to the ED today because he was having chest pain and feeling bad. They were told they could not come back to their outpatient center without further evaluation.  [facility] have been [facility] issues [facility] with some of the staff and are on a waiting list to [facility].  EKG with TW abnormalities, CXR with bilateral pleural effusions. Labs Na 142, K 5.5, BUN 101, Hgb 9.8 Troponin 0.12    Objective Vitals:   01/19/17 1500 01/19/17 1515 01/19/17 1530 01/19/17 1600  BP: (!) 150/77  (!) 153/77 (!) 153/83  Pulse: 67 65 64 64  Resp:  (!) 22 (!) 22 17  Temp:      TempSrc:      SpO2: 97% 99% 98% 99%  Weight:      Height:       Physical Exam General: WNWD elderly male, sleeping NAD  Heart: RRR 2/6 SEM  Lungs: Breathing unlabored. Lungs CTAB  Abdomen: soft NT/ND Extremities: R BKA, 1+  LLE edema  Dialysis Access: RUE AVF +bruit  Dialysis Orders:  [facility] TTS 4h 180F 350/800 EDW 72kg 2K/2Ca R AVF Hep 3000U Hectorol 1 mcg IV q HD Venofer 50 mg IV q week Mircera 100 mcg IV q 2 weeks (last dosed 10/9)   Assessment/Plan: 1. Noncompliance with HD/-Last HD was 10/16. Now presents with uremic symptoms - confusion, nausea, appetite loss. Labs relatively stable. Will plan short HD today then back on schedule tomorrow. 2. Chest pain/ICM  - per primary.  3.  ESRD - TTS. HD as above  4. Anemia - Hgb 9.8. Resume Aranesp 100 with HD 10/30  5. MBD-  Cont VDRA/OP Phos at goal. No binders on outpatient list. Follow trend  6. HTN/volume - BP elevated likely with volume/Should improve with UF  7. Nutrition - Renal diet/viatmins  8. Aflutter - - amiodarone 7. Dementia - ?major issue in decision making     Ogechi Susann GivensGrace Ejigiri PA-C [facility] Pager [phone] 01/19/2017,4:50 PM  LOS: 0 days   Pt seen, examined, agree w assess/plan as above with additions as indicated. ESRD pt missed 2 wks of dialysis , presenting with general fatigue and malaise, likely uremia symptoms. Plan HD tonight, will follow. May need to get palliative care involved.  Vinson Moselleob Kimmarie Pascale MD [facility] pager [phone]    cell [phone] 01/20/2017, 6:33 AM     Additional Objective Labs: Basic Metabolic Panel:  Recent Labs Lab 01/15/17 1454 01/19/17 0749  NA 141 142  K 5.1 5.5*  CL 114* 114*  CO2 15* 12*  GLUCOSE 124* 102*  BUN 90* 101*  CREATININE 7.59* 8.48*  CALCIUM 7.8* 8.2*   Liver Function Tests: No results for input(s): AST, ALT, ALKPHOS, BILITOT, PROT, ALBUMIN in the last 168 hours. No results for input(s): LIPASE, AMYLASE in the last 168 hours.  CBC:  Recent Labs Lab 01/15/17 1454 01/19/17 0749  WBC 6.5 7.0  HGB 9.1* 9.8*  HCT 29.7* 30.9*  MCV 76.3* 76.5*  PLT 121* 199   Blood Culture    Component Value Date/Time   SDES URINE, RANDOM 12/10/2014 1324   SPECREQUEST NONE 12/10/2014 1324   CULT 50,000 COLONIES/mL PROTEUS MIRABILIS 12/10/2014 1324   REPTSTATUS 12/13/2014 FINAL 12/10/2014 1324    Cardiac Enzymes:  Recent Labs Lab 01/19/17 1113  TROPONINI 0.12*   CBG:  Recent Labs Lab 01/19/17 0738  GLUCAP 99   Iron Studies: No results for input(s): IRON, TIBC, TRANSFERRIN, FERRITIN in the last 72 hours. Lab Results  Component Value Date   INR 1.70 12/29/2016   INR 2.10 12/08/2016   INR 2.80  11/17/2016   Medications:  . amiodarone  200 mg Oral Daily  . atorvastatin  40 mg Oral QPM  . calcium citrate-vitamin D  1 tablet Oral Daily  . famotidine  20 mg Oral BID  . heparin  5,000 Units Subcutaneous Q8H  . [START ON 01/20/2017] levothyroxine  25 mcg Oral QAC breakfast  . multivitamin with minerals  1 tablet Oral Daily

## 2017-01-19 NOTE — H&P (Signed)
Date: 01/19/2017               Patient Name:  [name] [name] MRN: [id number]  DOB: 12/23/1945 Age / Sex: 71 y.o., male   PCP: Doneen PoissonKlima, Lawrence, MD         Medical Service: Internal Medicine Teaching Service         Attending Physician: Dr. Gust RungHoffman, Erik C, DO    First Contact: Dr. Thornell MuleWinfrey, Brandon Pager: [phone]  Second Contact: Dr. Nyra MarketSvalina, Gorica Pager: [phone]       After Hours (After 5p/  First Contact Pager: [phone]  weekends / holidays): Second Contact Pager: [phone]   Chief Complaint: chest pain  History of Present Illness: 71 year old male past medical history of atrial flutter, cardiomyopathy, CAD, ESRD presents with wife who states she cannot handle him at home he has been weak and short of breath and having chest pain according to the wife.  When asking the patient about these symptoms however, he denies that he is having chest pain or that he is short of breath or has been weak recently.  He is very insistent that he goes home.  He has missed the last 4 appointments of dialysis.  He was seen here on October 25 for similar symptoms and left AGAINST MEDICAL ADVICE.  We offered dialysis to him again he again is insistent that he doesn't really want dialysis and that it may be has time to die.  The family says the patient has been very confused recently, when the patient is asked where he is he feels that [place], he is unsure what year or day it is, but is able to state his full name.  During the interview he was arguing with his wife and daughter about whether to get treatment or not. He continues to deny any shortness of breath, chest pain, loss of appetite, change in taste, nausea, vomiting.    ED course: patient arrived mildly hypertensive 160/71 BP, 100% O2 saturation on room air her respiratory rate 16, chest x ray demonstrated pleural effusions bilaterally larger on the right than the left with bibasilar atelectasis.  His CBC showed a  normal white count with chronic anemia close to pts baseline at 9.8.    His potassium is 5.5 he has a bicarbonate of 12 and an anion gap of 16, his BUN is  101.  His BNP returned as >4500.  Troponin of 0.12.  Urinalysis was neg for signs of infection.  He had a PT of 47.5    Meds:  Current Meds  Medication Sig  . acetaminophen (TYLENOL) 500 MG tablet Take 500-1,000 mg by mouth 2 (two) times daily as needed for moderate pain.  [name]. amiodarone (PACERONE) 200 MG tablet Take 1 tablet (200 mg total) by mouth daily.  [name]. atorvastatin (LIPITOR) 40 MG tablet Take 1 tablet (40 mg total) by mouth every evening.  . calcium citrate-vitamin D (CITRACAL+D) 315-200 MG-UNIT per tablet Take 1 tablet by mouth daily.   . carvedilol (COREG) 6.25 MG tablet Take 1 tablet (6.25 mg total) by mouth 2 (two) times daily. (Patient taking differently: Take 6.25 mg by mouth See admin instructions. 6.25 mg TWO times a day on Sun/Mon/Wed/Fri and 6.25 ONCE a day in the evening on Tues/Thurs/Sat)  . famotidine (PEPCID) 20 MG tablet TAKE 1 TABLET BY MOUTH BEFORE BREAKFAST AND 1 TABLET BEFORE SUPPER (Patient taking differently: Take 20 mg by mouth two times a  day before meals (BREAKFAST and SUPPER))  . fish oil-omega-3 fatty acids 1000 MG capsule Take 1,000 mg by mouth daily.   . fluticasone (FLONASE) 50 MCG/ACT nasal spray Place 1 spray into both nostrils daily as needed for allergies.  [name] gabapentin (NEURONTIN) 300 MG capsule Take 1 capsule (300 mg total) by mouth daily as needed (for stump pain).  . hydroxypropyl methylcellulose (ISOPTO TEARS) 2.5 % ophthalmic solution Place 2 drops into both eyes 3 (three) times daily as needed for dry eyes. Reported on 04/20/2015  . levothyroxine (SYNTHROID, LEVOTHROID) 25 MCG tablet Take 1 tablet (25 mcg total) by mouth daily before breakfast.  . Multiple Vitamins-Minerals (MULTIVITAMIN WITH MINERALS) tablet Take 1 tablet by mouth daily.  . ondansetron (ZOFRAN) 4 MG tablet Take 1 tablet (4 mg total) by  mouth every 8 (eight) hours as needed for nausea.  [name] warfarin (COUMADIN) 2.5 MG tablet Take 2 tablets all days of the week EXCEPT on Sundays, Wednesdays, and Fridays, take 1 & 1/2 tablets (Patient taking differently: Take 5 mg by mouth every evening. )     Allergies: Allergies as of 01/19/2017 - Review Complete 01/19/2017  Allergen Reaction Noted  . Lisinopril Other (See Comments) 11/22/2014  . Amoxicillin Swelling and Other (See Comments) 04/21/2012  . Pravastatin Other (See Comments) 05/26/2014  . Tamsulosin Other (See Comments)   . Doxycycline Rash and Other (See Comments) 12/02/2011  . Tape Rash 01/15/2017   Past Medical History:  Diagnosis Date  . Arthritis    "fingers" (02/07/2015)  . Atrial flutter (HCC) 07/26/2014   a. May 2016, CHA2DS2VASc = 4 -> Eliquis, spontaneous conversion to NSR;  b. On amio;  c. 01/2015 EPS: Unable to induce right sided Aflutter. Non-sustained Afib and Left sided Aflutter noted.  . Benign prostatic hyperplasia with elevated prostate specific antigen (PSA) 10/31/2016   PSA (08/2008): 8.38, PSA (05/2009): 6.63, PSA (11/2009): 13.50, PSA (12/2009): 13.70, PSA (12/2010) 11.99, PSA (06/2011): 11.88, PSA (12/2011): 13.93, PSA (12/2014): 13.49 TRUS/BX (10/2008) Prostate 54 cc, Pathology BPH with single core of chronic inflammation  . Benign prostatic hypertrophy with nocturia 07/07/2006  . Cardiomyopathy (HCC)    a. 07/2014 Echo: EF 50%; b. 03/2015 Ech: EF 20-25%; c. 07/2015 Echo: EF 30-35%, Gr1 DD, sev LVH.  [name] Chronic venous insufficiency 04/12/2010  . Closed displaced fracture of left femoral neck (HCC) 06/14/2014   s/p left hip hemiarthroplasty June 14, 2014   . Constipation 05/25/2009   Intermittent   . Coronary artery disease 04/02/2006   a. s/p RCA DES (2.5 x 24 mm TAXUS drug-eluting stent) 2006;  b. low risk myoview in 2011; c. 5/17 MV: EF 28%, large inf apical and inflat infarct w/o ischemia.  . Degenerative joint disease involving multiple joints 02/02/2007  . End  stage renal disease on dialysis [facility])    a. TTS Dialysis - currently through right chest diatek, pending AVF.  [name] GERD (gastroesophageal reflux disease)   . Hepatitis    Hep C  . History of blood transfusion 05/2014   "related to hip OR"  . 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 Hyperlipidemia 04/02/2006  . Hypertension   . Hypertensive heart disease 12/09/2006  . Hypothyroidism   . Internal and external hemorrhoids without complication 08/20/2012  . Microcytic normochromic anemia 05/27/2006  . Obesity (BMI 30.0-34.9) 01/15/2012  . Phantom limb pain (HCC) 06/13/2016  . 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 Type 2 diabetes mellitus with peripheral artery disease (HCC) 05/27/2006   Absent pulses in the left foot   . 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 Hypertension Mother   . Heart attack Father   . Breast cancer Sister   . Arthritis Sister   . Heart attack Brother   . Diabetes Brother   . Stroke Brother   . Pneumonia Daughter   . Diabetes Brother   . Alcoholism Brother   . Diabetes Brother   . Arthritis Sister        Bilateral knee replacement  . HIV Daughter   . Hypertension Daughter   . Drug abuse Daughter   . Schizophrenia Daughter   . Hypertension Daughter   . Drug abuse Daughter   . Bipolar disorder Daughter     Social History:  Social History   Social History  . Marital status: Married    Spouse name: N/A  . Number of children: N/A  .  Years of education: N/A   Occupational History  . Not on file.   Social History Main Topics  . Smoking status: Former Smoker    Packs/day: 1.00    Years: 10.00    Types: Cigarettes    Quit date: 06/16/1968  . Smokeless tobacco: Never Used  . Alcohol use No     Comment: "quit alcohol in the 1960's"  . Drug use: No  . Sexual activity: Not Currently    Birth control/ protection: None   Other Topics Concern  . Not on file   Social History Narrative  . No narrative on file    Review of Systems: A complete ROS was negative except as per HPI.   Physical Exam: Blood pressure (!) 158/83, pulse 65, temperature 97.7 F (36.5 C), temperature source Oral, resp. rate 20, height 5\' 9"  (1.753 m), weight 172 lb (78 kg), SpO2 100 %. Physical Exam  Constitutional: He appears well-developed and well-nourished.  HENT:  Head: Normocephalic and atraumatic.  Eyes: Right eye exhibits no discharge. Left eye exhibits no discharge. No scleral icterus.  Neck: JVD present.  Cardiovascular: Normal rate, regular rhythm and intact distal pulses.  Exam reveals no gallop and no friction rub.   Murmur (2/6 systolic) heard. Pulmonary/Chest: Effort normal and breath sounds normal. No respiratory distress. He has no wheezes. He has no rales.  Abdominal: Soft. Bowel sounds are normal. He exhibits no distension and no mass. There is no tenderness. There is no guarding.  Musculoskeletal:  R aka, no prosthesis, left leg non edematous but atrophied  Lymphadenopathy:    He has no cervical adenopathy.  Neurological: He is alert. No cranial nerve deficit. GCS eye subscore is 4. GCS verbal subscore is 5. GCS motor subscore is 6.  Oriented to self only, generalized weakness, no focal deficits  Skin: Skin is warm and dry.    EKG: personally reviewed my interpretation is sinus rhythm, RBBB, borderline long QT syndrome, nonspecific T abnormalities  CXR: personally reviewed my interpretation is Bilateral pleural  effusions R>L with corresponding atelectasis  Assessment & Plan by Problem:  Active Problems:   Uremia  71 year old male past medical history of atrial flutter, cardiomyopathy, CAD, ESRD presents with chest pain, weakness, shortness of breath per his wife after missing four dialysis sessions.     Uremia: patient has missed last for dialysis sessions His potassium is 5.5 he has a bicarbonate of 12 and an anion gap of 16, his BUN is  101.  He is oriented only to self   -nephrology consulted -pt insists on going home, likely will end up getting dialysis before discharge  SOB/Chest Pain: pt denies but per the wife has been having SOB/ chest pain, likely due to volume overload exacerbating pts heart failure, pt with bilateral pleural effusions and elevated JVD.  EKG not suspicious for acs, pt has chronic troponin elevation and has missed several dialysis sessions.    -nephrology consulted, pt needs dialysis  A flutter: stable, pt on amiodarone, has been in sinus rhythm since arrival  -continue amiodarone    Dispo: Admit patient to Observation with expected length of stay less than 2 midnights.  Signed: Angelita Ingles, MD 01/19/2017, 2:42 PM  Thornell Mule MD PGY-1 Internal Medicine Pager # [phone]

## 2017-01-19 NOTE — ED Provider Notes (Signed)
Emergency Department Provider Note   I have reviewed the triage vital signs and the nursing notes.   HISTORY  Chief Complaint Weakness   HPI [name] [name] is a 71 y.o. male history of atrial flutter, cardiomyopathy, coronary artery disease, end-stage renal disease on dialysis that has missed his last 4 appointments the presents to the emergency department today secondary to shortness of breath, chest pain and altered mental status.  Patient supplies some of the history but most of it is by his wife and daughter at bedside.  It sounds like there are some social issues with him trying to obtain dialysis and he has missed the last 4 appointments.  He was actually here a few days ago and was admitted to get dialysis the next day but left AGAINST MEDICAL ADVICE.  Family states has been a little bit more confused and weak since that time he has been complaining of shortness of breath and chest pain that is worse with ambulating.  No lower extremity swelling.  States that they cannot get back into the dialysis center because they said he needs to be evaluated by a doctor first.  No recent fevers, change in appetite or infections. No other associated modifying symptoms.   Past Medical History:  Diagnosis Date  . Arthritis    "fingers" (02/07/2015)  . Atrial flutter (HCC) 07/26/2014   a. May 2016, CHA2DS2VASc = 4 -> Eliquis, spontaneous conversion to NSR;  b. On amio;  c. 01/2015 EPS: Unable to induce right sided Aflutter. Non-sustained Afib and Left sided Aflutter noted.  . Benign prostatic hyperplasia with elevated prostate specific antigen (PSA) 10/31/2016   PSA (08/2008): 8.38, PSA (05/2009): 6.63, PSA (11/2009): 13.50, PSA (12/2009): 13.70, PSA (12/2010) 11.99, PSA (06/2011): 11.88, PSA (12/2011): 13.93, PSA (12/2014): 13.49 TRUS/BX (10/2008) Prostate 54 cc, Pathology BPH with single core of chronic inflammation  . Benign prostatic hypertrophy with nocturia 07/07/2006  . Cardiomyopathy (HCC)    a. 07/2014 Echo: EF 50%; b. 03/2015 Ech: EF 20-25%; c. 07/2015 Echo: EF 30-35%, Gr1 DD, sev LVH.  [name] Chronic venous insufficiency 04/12/2010  . Closed displaced fracture of left femoral neck (HCC) 06/14/2014   s/p left hip hemiarthroplasty June 14, 2014   . Constipation 05/25/2009   Intermittent   . Coronary artery disease 04/02/2006   a. s/p RCA DES (2.5 x 24 mm TAXUS drug-eluting stent) 2006;  b. low risk myoview in 2011; c. 5/17 MV: EF 28%, large inf apical and inflat infarct w/o ischemia.  . Degenerative joint disease involving multiple joints 02/02/2007  . End stage renal disease on dialysis [facility])    a. TTS Dialysis - currently through right chest diatek, pending AVF.  [name] GERD (gastroesophageal reflux disease)   . Hepatitis    Hep C  . History of blood transfusion 05/2014   "related to hip OR"  . 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 Hyperlipidemia 04/02/2006  . Hypertension   . Hypertensive heart disease 12/09/2006  . Hypothyroidism   . Internal and external hemorrhoids without complication 08/20/2012  . Microcytic normochromic anemia 05/27/2006  . Obesity (BMI 30.0-34.9) 01/15/2012  . Phantom limb pain (HCC) 06/13/2016  . 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 Type 2 diabetes mellitus with peripheral artery disease (HCC) 05/27/2006   Absent pulses in the left foot   . Type 2 diabetes mellitus with stage 5 chronic kidney disease (HCC) 08/06/2012    Patient Active Problem List   Diagnosis Date Noted  . Uremia 01/19/2017  . Volume overload 01/15/2017  . Benign prostatic  hyperplasia with elevated prostate specific antigen (PSA) 10/31/2016  . Long term (current) use of anticoagulants [Z79.01] 07/28/2016  . Phantom limb pain (HCC) 06/13/2016  . 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 Hyperlipidemia 08/10/2015  . End stage renal disease on dialysis (HCC)   . Hypothyroidism, acquired 12/12/2014  . Chronic hepatitis C without hepatic coma (HCC) 12/11/2014  . Aortic atherosclerosis (HCC) 11/30/2014  . Right bundle branch block (RBBB) 07/27/2014  . Paroxysmal atrial flutter (HCC) 07/26/2014  . Gastroesophageal reflux disease without esophagitis 07/01/2013  . Internal and external hemorrhoids without complication 08/20/2012  . Type 2 diabetes mellitus with end-stage renal disease (HCC) 08/06/2012  . Overweight (BMI 25.0-29.9) 01/15/2012  . Healthcare maintenance 12/12/2010  . Chronic venous insufficiency 04/12/2010  . Seasonal allergic rhinitis 05/25/2009  . Degenerative joint disease involving multiple joints 02/02/2007  . Essential hypertension 12/09/2006  . 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 Anemia of chronic renal failure, stage 5 (HCC) 05/27/2006  . Type 2 diabetes mellitus with peripheral artery disease (HCC) 05/27/2006  . Coronary artery disease of native artery of native heart with stable angina pectoris (HCC) 04/02/2006    Past Surgical History:  Procedure Laterality Date  . A/V FISTULAGRAM N/A 11/05/2016   Procedure: A/V Fistulagram Right Arm;  Surgeon: Maeola Harmanain, Brandon Christopher, MD;  [facility];  Service: Cardiovascular;  Laterality: N/A;  . AV FISTULA PLACEMENT Left 10/08/2015   Procedure:  BRACHIOCEPHALIC ARTERIOVENOUS (AV) FISTULA CREATION Right Arm;  Surgeon: Fransisco HertzBrian L Chen,  MD;  [facility];  Service: Vascular;  Laterality: Left;  . CARDIAC CATHETERIZATION N/A 09/21/2015   Procedure: Left Heart Cath and Coronary Angiography;  Surgeon: Marykay Lexavid W Harding, MD;  [facility];  Service: Cardiovascular;  Laterality: N/A;  . CHOLECYSTECTOMY N/A 12/12/2014   Procedure: LAPAROSCOPIC CHOLECYSTECTOMY WITH INTRAOPERATIVE CHOLANGIOGRAM;  Surgeon: Gaynelle AduEric Wilson, MD;  Location: [facility] OR;  Service: General;  Laterality: N/A;  . CORONARY ANGIOPLASTY WITH STENT PLACEMENT    . ELECTROPHYSIOLOGIC STUDY N/A 02/07/2015   Procedure: A-Flutter Ablation;  Surgeon: Marinus MawGregg W Taylor, MD;  [facility];  Service: Cardiovascular;  Laterality: N/A;  . FRACTURE SURGERY    . INSERTION OF DIALYSIS CATHETER Right 06/13/2015   Procedure: INSERTION OF DIALYSIS CATHETER RIGHT INTERNAL JUGULAR;  Surgeon: Larina Earthlyodd F Early, MD;  [facility];  Service: Vascular;  Laterality: Right;  . JOINT REPLACEMENT    . LEG AMPUTATION ABOVE KNEE Right ~ 2008  . PERIPHERAL VASCULAR INTERVENTION Right 11/05/2016   Procedure: PERIPHERAL VASCULAR INTERVENTION;  Surgeon: Maeola Harmanain, Brandon Christopher, MD;  [facility];  Service: Cardiovascular;  Laterality: Right;  INNOMINANTE VEIN  . PILONIDAL CYST EXCISION  1990's  . PROSTATE BIOPSY  ~ 2013  . REFRACTIVE SURGERY Bilateral   . REMOVAL OF A DIALYSIS CATHETER Right  06/13/2015   Procedure: REMOVAL OF A DIALYSIS CATHETER;  Surgeon: Larina Earthly, MD;  Location: [facility] OR;  Service: Vascular;  Laterality: Right;  . SVC VENOGRAPHY N/A 11/05/2016   Procedure: SVC Venography;  Surgeon: Maeola Harman, MD;  Location: [facility] INVASIVE CV LAB;  Service: Cardiovascular;  Laterality: N/A;  . TONSILLECTOMY    . TOTAL HIP ARTHROPLASTY Left 06/14/2014   Procedure: HEMI HIP ARTHROPLASTY ANTERIOR APPROACH;  Surgeon: Tarry Kos, MD;  Location: [facility] OR;  Service: Orthopedics;  Laterality: Left;    Current Outpatient Rx  . Order #: [id number] Class:  Historical Med  . Order #: [id number] Class: Normal  . Order #: [id number] Class: Normal  . Order #: [id number] Class: Historical Med  . Order #: [id number] Class: Normal  . Order #: [id number] Class: Normal  . Order #: [id number] Class: Historical Med  . Order #: [id number] Class: Normal  . Order #: [id number] Class: Normal  . Order #: [id number] Class: Historical Med  . Order #: [id number] Class: Normal  . Order #: [id number] Class: Historical Med  . Order #: [id number] Class: Normal  . Order #: [id number] Class: Normal    Allergies Lisinopril; Amoxicillin; Pravastatin; Tamsulosin; Doxycycline; and Tape  Family History  Problem Relation Age of Onset  . Hypertension Mother   . Heart attack Father   . Breast cancer Sister   . Arthritis Sister   . Heart attack Brother   . Diabetes Brother   . Stroke Brother   . Pneumonia Daughter   . Diabetes Brother   . Alcoholism Brother   . Diabetes Brother   . Arthritis Sister        Bilateral knee replacement  . HIV Daughter   . Hypertension Daughter   . Drug abuse Daughter   . Schizophrenia Daughter   . Hypertension Daughter   . Drug abuse Daughter   . Bipolar disorder Daughter     Social History Social History  Substance Use Topics  . Smoking status: Former Smoker    Packs/day: 1.00    Years: 10.00    Types: Cigarettes    Quit date: 06/16/1968  . Smokeless tobacco: Never Used  . Alcohol use No     Comment: "quit alcohol in the 1960's"    Review of Systems  All other systems negative except as documented in the HPI. All pertinent positives and negatives as reviewed in the HPI. ____________________________________________   PHYSICAL EXAM:  VITAL SIGNS: ED Triage Vitals  Enc Vitals Group     BP 01/19/17 0718 (!) 157/81     Pulse Rate 01/19/17 0718 68     Resp 01/19/17 0718 (!) 24     Temp 01/19/17 0718 97.7 F (36.5 C)     Temp Source 01/19/17 0718 Oral     SpO2 01/19/17 0718 99 %     Weight 01/19/17 0719 172 lb (78 kg)     Height  01/19/17 0719 5\' 9"  (1.753 m)    Constitutional: Alert and oriented. Well appearing and in no acute distress. Eyes: Conjunctivae are normal. PERRL. EOMI. Head: Atraumatic. Nose: No congestion/rhinnorhea. Mouth/Throat: Mucous membranes are moist.  Oropharynx non-erythematous. Neck: No stridor.  No meningeal signs.   Cardiovascular: Normal rate, regular rhythm. Good peripheral circulation. Grossly normal heart sounds.   Respiratory: Normal respiratory effort.  No retractions. Lungs bilateral rales halfway up. Gastrointestinal: Soft and nontender. No distention.  Musculoskeletal: No lower extremity tenderness nor edema. No gross deformities of extremities. Neurologic:  Normal speech and language. No gross focal neurologic deficits are appreciated.  Skin:  Skin  is warm, dry and intact. No rash noted.  ____________________________________________   LABS (all labs ordered are listed, but only abnormal results are displayed)  Labs Reviewed  BASIC METABOLIC PANEL - Abnormal; Notable for the following:       Result Value   Potassium 5.5 (*)    Chloride 114 (*)    CO2 12 (*)    Glucose, Bld 102 (*)    BUN 101 (*)    Creatinine, Ser 8.48 (*)    Calcium 8.2 (*)    GFR calc non Af Amer 5 (*)    GFR calc Af Amer 6 (*)    Anion gap 16 (*)    All other components within normal limits  CBC - Abnormal; Notable for the following:    RBC 4.04 (*)    Hemoglobin 9.8 (*)    HCT 30.9 (*)    MCV 76.5 (*)    MCH 24.3 (*)    RDW 18.0 (*)    All other components within normal limits  URINALYSIS, ROUTINE W REFLEX MICROSCOPIC - Abnormal; Notable for the following:    Ketones, ur 5 (*)    Protein, ur 100 (*)    Squamous Epithelial / LPF 0-5 (*)    All other components within normal limits  TROPONIN I - Abnormal; Notable for the following:    Troponin I 0.12 (*)    All other components within normal limits  BRAIN NATRIURETIC PEPTIDE - Abnormal; Notable for the following:    B Natriuretic Peptide  >4,500.0 (*)    All other components within normal limits  PROTIME-INR - Abnormal; Notable for the following:    Prothrombin Time 47.5 (*)    All other components within normal limits  PROTIME-INR  CBG MONITORING, ED   ____________________________________________  EKG  My ECG Read Indication:chest pain/sob EKG was personally contemporaneously reviewed by myself. Rate: 69 PR Interval: 186 QRS duration: 152 QT/QTC: 510/546 Axis: right EKG: unchanged from previous tracings, RBBB. Other significant findings: none  ____________________________________________  RADIOLOGY  Dg Chest 2 View  Result Date: 01/19/2017 CLINICAL DATA:  Cardiomyopathy with hypertension. EXAM: CHEST  2 VIEW COMPARISON:  January 15, 2017 FINDINGS: There is a right pleural effusion with a minimal left pleural effusion. There is bibasilar atelectatic change. There is no consolidation. Heart is enlarged with pulmonary vascularity within normal limits. No adenopathy. There is calcification in each carotid artery. No evident bone lesions. There is a stent in the right innominate vein region. IMPRESSION: Cardiomegaly. Pleural effusions bilaterally, larger on the right than on the left. Bibasilar atelectasis. No frank consolidation. Pulmonary vascular within normal limits. There is carotid artery calcification bilaterally. Electronically Signed   By: Bretta Bang III M.D.   On: 01/19/2017 11:01    ____________________________________________   PROCEDURES  Procedure(s) performed:   Procedures   ____________________________________________   INITIAL IMPRESSION / ASSESSMENT AND PLAN / ED COURSE  Pertinent labs & imaging results that were available during my care of the patient were reviewed by me and considered in my medical decision making (see chart for details). Suspect fluid overload secondary to not having dialysis.  Also suspect that the patient has altered mental status secondary to uremia possibly.   We will add on troponin and a BNP and discussed with nephrology and medicine about try to get dialysis.  Also need social work help to figure out a place to get dialysis after he leaves.  Discussed with medicine who will admit.  Patient does not want to  be admitted however his wife refuses to take him home and he is not able to make his own decisions at this time secondary to altered mental status.  He is unable to ambulate or take care of himself and thus will be admitted to the hospital.  I discussed it with the nephrologist, Dr. Arlean Hopping, who will see him in consultation.  ___________________________________________  FINAL CLINICAL IMPRESSION(S) / ED DIAGNOSES  Final diagnoses:  Weakness  Uremia  Encephalopathy     MEDICATIONS GIVEN DURING THIS VISIT:  Medications  nitroGLYCERIN (NITROSTAT) SL tablet 0.4 mg (0.4 mg Sublingual Given 01/19/17 1307)  famotidine (PEPCID) tablet 20 mg (not administered)  atorvastatin (LIPITOR) tablet 40 mg (not administered)  levothyroxine (SYNTHROID, LEVOTHROID) tablet 25 mcg (not administered)  amiodarone (PACERONE) tablet 200 mg (not administered)  multivitamin with minerals tablet 1 tablet (not administered)  calcium citrate-vitamin D (CITRACAL+D) 315-200 MG-UNIT per tablet 1 tablet (not administered)  heparin injection 5,000 Units (not administered)  acetaminophen (TYLENOL) tablet 650 mg (not administered)    Or  acetaminophen (TYLENOL) suppository 650 mg (not administered)  aspirin chewable tablet 324 mg (324 mg Oral Given 01/19/17 1245)  LORazepam (ATIVAN) injection 1 mg (1 mg Intravenous Given 01/19/17 1445)     NEW OUTPATIENT MEDICATIONS STARTED DURING THIS VISIT:  New Prescriptions   No medications on file    Note:  This document was prepared using Dragon voice recognition software and may include unintentional dictation errors.   Marily Memos, MD 01/19/17 [phone]

## 2017-01-19 NOTE — ED Notes (Signed)
Got patient undress on the monitor patient is resting with call bell in reach and family at bedside 

## 2017-01-19 NOTE — ED Notes (Signed)
Family reports pt has not been to dialysis in one week because they are "trying to find a new dialysis center". Family also reports that he was c/o soreness in his chest today but pt denies any pain or soreness.

## 2017-01-20 DIAGNOSIS — I2510 Atherosclerotic heart disease of native coronary artery without angina pectoris: Secondary | ICD-10-CM

## 2017-01-20 DIAGNOSIS — Z811 Family history of alcohol abuse and dependence: Secondary | ICD-10-CM

## 2017-01-20 DIAGNOSIS — R791 Abnormal coagulation profile: Secondary | ICD-10-CM | POA: Diagnosis not present

## 2017-01-20 DIAGNOSIS — N186 End stage renal disease: Secondary | ICD-10-CM

## 2017-01-20 DIAGNOSIS — Z91048 Other nonmedicinal substance allergy status: Secondary | ICD-10-CM

## 2017-01-20 DIAGNOSIS — N19 Unspecified kidney failure: Secondary | ICD-10-CM | POA: Diagnosis not present

## 2017-01-20 DIAGNOSIS — I4892 Unspecified atrial flutter: Secondary | ICD-10-CM | POA: Diagnosis not present

## 2017-01-20 DIAGNOSIS — Z833 Family history of diabetes mellitus: Secondary | ICD-10-CM

## 2017-01-20 DIAGNOSIS — Z8261 Family history of arthritis: Secondary | ICD-10-CM

## 2017-01-20 DIAGNOSIS — R0602 Shortness of breath: Secondary | ICD-10-CM

## 2017-01-20 DIAGNOSIS — Z992 Dependence on renal dialysis: Secondary | ICD-10-CM

## 2017-01-20 DIAGNOSIS — Z79899 Other long term (current) drug therapy: Secondary | ICD-10-CM

## 2017-01-20 DIAGNOSIS — Z9115 Patient's noncompliance with renal dialysis: Secondary | ICD-10-CM

## 2017-01-20 DIAGNOSIS — Z803 Family history of malignant neoplasm of breast: Secondary | ICD-10-CM

## 2017-01-20 DIAGNOSIS — Z888 Allergy status to other drugs, medicaments and biological substances status: Secondary | ICD-10-CM

## 2017-01-20 DIAGNOSIS — M62562 Muscle wasting and atrophy, not elsewhere classified, left lower leg: Secondary | ICD-10-CM

## 2017-01-20 DIAGNOSIS — I5043 Acute on chronic combined systolic (congestive) and diastolic (congestive) heart failure: Secondary | ICD-10-CM

## 2017-01-20 DIAGNOSIS — Z881 Allergy status to other antibiotic agents status: Secondary | ICD-10-CM

## 2017-01-20 DIAGNOSIS — D6489 Other specified anemias: Secondary | ICD-10-CM | POA: Diagnosis not present

## 2017-01-20 DIAGNOSIS — Z836 Family history of other diseases of the respiratory system: Secondary | ICD-10-CM

## 2017-01-20 DIAGNOSIS — R0789 Other chest pain: Secondary | ICD-10-CM

## 2017-01-20 DIAGNOSIS — I4510 Unspecified right bundle-branch block: Secondary | ICD-10-CM

## 2017-01-20 DIAGNOSIS — I132 Hypertensive heart and chronic kidney disease with heart failure and with stage 5 chronic kidney disease, or end stage renal disease: Secondary | ICD-10-CM | POA: Diagnosis not present

## 2017-01-20 DIAGNOSIS — Z818 Family history of other mental and behavioral disorders: Secondary | ICD-10-CM

## 2017-01-20 DIAGNOSIS — R011 Cardiac murmur, unspecified: Secondary | ICD-10-CM

## 2017-01-20 DIAGNOSIS — Z830 Family history of human immunodeficiency virus [HIV] disease: Secondary | ICD-10-CM

## 2017-01-20 DIAGNOSIS — I4581 Long QT syndrome: Secondary | ICD-10-CM

## 2017-01-20 DIAGNOSIS — I878 Other specified disorders of veins: Secondary | ICD-10-CM

## 2017-01-20 DIAGNOSIS — Z87891 Personal history of nicotine dependence: Secondary | ICD-10-CM

## 2017-01-20 DIAGNOSIS — Z89611 Acquired absence of right leg above knee: Secondary | ICD-10-CM

## 2017-01-20 DIAGNOSIS — Z8249 Family history of ischemic heart disease and other diseases of the circulatory system: Secondary | ICD-10-CM

## 2017-01-20 DIAGNOSIS — I429 Cardiomyopathy, unspecified: Secondary | ICD-10-CM | POA: Diagnosis not present

## 2017-01-20 DIAGNOSIS — Z823 Family history of stroke: Secondary | ICD-10-CM

## 2017-01-20 LAB — CBC
HEMATOCRIT: 29.1 % — AB (ref 39.0–52.0)
Hemoglobin: 9.3 g/dL — ABNORMAL LOW (ref 13.0–17.0)
MCH: 24.2 pg — ABNORMAL LOW (ref 26.0–34.0)
MCHC: 32 g/dL (ref 30.0–36.0)
MCV: 75.6 fL — AB (ref 78.0–100.0)
Platelets: 191 10*3/uL (ref 150–400)
RBC: 3.85 MIL/uL — ABNORMAL LOW (ref 4.22–5.81)
RDW: 17.8 % — ABNORMAL HIGH (ref 11.5–15.5)
WBC: 7.4 10*3/uL (ref 4.0–10.5)

## 2017-01-20 LAB — RENAL FUNCTION PANEL
Albumin: 2.9 g/dL — ABNORMAL LOW (ref 3.5–5.0)
Anion gap: 13 (ref 5–15)
BUN: 106 mg/dL — AB (ref 6–20)
CHLORIDE: 115 mmol/L — AB (ref 101–111)
CO2: 11 mmol/L — ABNORMAL LOW (ref 22–32)
CREATININE: 8.76 mg/dL — AB (ref 0.61–1.24)
Calcium: 7.9 mg/dL — ABNORMAL LOW (ref 8.9–10.3)
GFR calc Af Amer: 6 mL/min — ABNORMAL LOW (ref >60)
GFR, EST NON AFRICAN AMERICAN: 5 mL/min — AB (ref >60)
GLUCOSE: 101 mg/dL — AB (ref 65–99)
Phosphorus: 6.7 mg/dL — ABNORMAL HIGH (ref 2.5–4.6)
Potassium: 5.8 mmol/L — ABNORMAL HIGH (ref 3.5–5.1)
Sodium: 139 mmol/L (ref 135–145)

## 2017-01-20 LAB — PROTIME-INR
INR: 7.02 — AB
INR: 6.01 — AB
Prothrombin Time: 60 seconds — ABNORMAL HIGH (ref 11.4–15.2)
Prothrombin Time: 53.2 seconds — ABNORMAL HIGH (ref 11.4–15.2)

## 2017-01-20 LAB — MRSA PCR SCREENING: MRSA by PCR: NEGATIVE

## 2017-01-20 LAB — GLUCOSE, CAPILLARY
GLUCOSE-CAPILLARY: 95 mg/dL (ref 65–99)
Glucose-Capillary: 85 mg/dL (ref 65–99)

## 2017-01-20 LAB — HEPATITIS B SURFACE ANTIGEN: HEP B S AG: NEGATIVE

## 2017-01-20 MED ORDER — SODIUM CHLORIDE 0.9 % IV SOLN: 100.0000 mL | INTRAVENOUS | Status: DC | PRN

## 2017-01-20 MED ORDER — ALTEPLASE 2 MG IJ SOLR: 2.0000 mg | Freq: Once | INTRAMUSCULAR | Status: DC | PRN

## 2017-01-20 MED ORDER — HEPARIN SODIUM (PORCINE) 1000 UNIT/ML DIALYSIS
20.0000 [IU]/kg | INTRAMUSCULAR | Status: DC | PRN
  Administered 2017-01-20: 1600 [IU] via INTRAVENOUS_CENTRAL

## 2017-01-20 MED ORDER — LIDOCAINE-PRILOCAINE 2.5-2.5 % EX CREA: 1.0000 "application " | TOPICAL_CREAM | CUTANEOUS | Status: DC | PRN

## 2017-01-20 MED ORDER — LIDOCAINE HCL (PF) 1 % IJ SOLN: 5.0000 mL | INTRAMUSCULAR | Status: DC | PRN

## 2017-01-20 MED ORDER — HEPARIN SODIUM (PORCINE) 1000 UNIT/ML DIALYSIS: 1000.0000 [IU] | INTRAMUSCULAR | Status: DC | PRN

## 2017-01-20 MED ORDER — PENTAFLUOROPROP-TETRAFLUOROETH EX AERO: 1.0000 "application " | INHALATION_SPRAY | CUTANEOUS | Status: DC | PRN

## 2017-01-20 MED ORDER — WARFARIN - PHARMACIST DOSING INPATIENT: Freq: Every day | Status: DC

## 2017-01-20 MED ORDER — DOXERCALCIFEROL 4 MCG/2ML IV SOLN
INTRAVENOUS | Status: AC
  Administered 2017-01-20: 1 ug via INTRAVENOUS
  Filled 2017-01-20: qty 2

## 2017-01-20 MED ORDER — RENA-VITE PO TABS: 1.0000 | ORAL_TABLET | Freq: Every day | ORAL | Status: DC

## 2017-01-20 MED ORDER — DARBEPOETIN ALFA 100 MCG/0.5ML IJ SOSY
PREFILLED_SYRINGE | INTRAMUSCULAR | Status: AC
  Filled 2017-01-20: qty 0.5

## 2017-01-20 MED ORDER — HEPARIN SODIUM (PORCINE) 1000 UNIT/ML DIALYSIS: 20.0000 [IU]/kg | INTRAMUSCULAR | Status: DC | PRN

## 2017-01-20 NOTE — Progress Notes (Signed)
ANTICOAGULATION CONSULT NOTE   Pharmacy Consult for warfarin Indication: atrial fibrillation  Allergies  Allergen Reactions  . Lisinopril Other (See Comments)    Acute kidney injury  . Amoxicillin Swelling and Other (See Comments)    Puffy eyes & abdominal pain Has patient had a PCN reaction causing immediate rash, facial/tongue/throat swelling, SOB or lightheadedness with hypotension: Yes Has patient had a PCN reaction causing severe rash involving mucus membranes or skin necrosis: Unknown Has patient had a PCN reaction that required hospitalization: Unknown Has patient had a PCN reaction occurring within the last 10 years: No    . Pravastatin Other (See Comments)    Weakness and fatigue  . Tamsulosin Other (See Comments)    dry throat, sweating, blurred vision  . Doxycycline Rash and Other (See Comments)    Questionable drug rxn rash  . Tape Rash    Medical tape     Patient Measurements: Height: 5\' 9"  (175.3 cm) Weight: 160 lb 7.9 oz (72.8 kg) IBW/kg (Calculated) : 70.7 Heparin Dosing Weight: 78kg   Vital Signs: Temp: 98.6 F (37 C) (10/30 0910) Temp Source: Oral (10/30 0910) BP: 131/75 (10/30 0910) Pulse Rate: 70 (10/30 0910)  Labs:  Recent Labs  01/19/17 0749 01/19/17 1113  01/19/17 1632 01/20/17 0130 01/20/17 1224  HGB 9.8*  --   --   --  9.3*  --   HCT 30.9*  --   --   --  29.1*  --   PLT 199  --   --   --  191  --   LABPROT  --   --   < > 48.3* 60.0* 53.2*  INR  --   --   --  5.31* 7.02* 6.01*  CREATININE 8.48*  --   --   --  8.76*  --   TROPONINI  --  0.12*  --   --   --   --   < > = values in this interval not displayed.  Estimated Creatinine Clearance: 7.5 mL/min (A) (by C-G formula based on SCr of 8.76 mg/dL (H)).   Assessment: [age] YOM admitted with weakness and likely uremia. PMH of Afib, on Warfarin 5mg  every evening- to be held and plans to start IV heparin as patient complained of chest pain. INR has been too elevated to begin heparin.  Patient not complaining of chest pain any more and no cardiac workup plan per discussion with IM team.  INR elevated on admission, and is still elevated today at 6.01.  Hgb 9.3, plts 191- no bleeding noted. No reversal agents given.   Plan:  HOLD warfarin Daily INR Will provide recommendations for home dose when close to discharge  Shahram Alexopoulos D. Julianna Vanwagner, PharmD, BCPS Clinical Pharmacist Pager: [phone] Clinical Phone for 01/20/2017 until 3:30pm: [phone] If after 3:30pm, please call main pharmacy at [phone] 01/20/2017 1:58 PM

## 2017-01-20 NOTE — Progress Notes (Signed)
[facility] Progress Note  Subjective: more awake and responsive today per the pt's wife.    Vitals:   01/20/17 0425 01/20/17 0505 01/20/17 0606 01/20/17 0910  BP: (!) 130/95 135/83 132/75 131/75  Pulse: 62 84 88 70  Resp: 18 18 18 18   Temp:  98 F (36.7 C) 97.9 F (36.6 C) 98.6 F (37 C)  TempSrc:  Oral Oral Oral  SpO2: 100% 98% 100% 96%  Weight:  72.8 kg (160 lb 7.9 oz)    Height:        Inpatient medications: . amiodarone  200 mg Oral Daily  . atorvastatin  40 mg Oral QPM  . calcium-vitamin D  1 tablet Oral Q breakfast  . darbepoetin (ARANESP) injection - DIALYSIS  100 mcg Intravenous Q Tue-HD  . doxercalciferol  1 mcg Intravenous Q T,Th,Sa-HD  . famotidine  20 mg Oral BID  . levothyroxine  25 mcg Oral QAC breakfast  . multivitamin  1 tablet Oral QHS   . sodium chloride    . sodium chloride     sodium chloride, sodium chloride, acetaminophen **OR** acetaminophen, alteplase, heparin, heparin, lidocaine (PF), lidocaine-prilocaine, nitroGLYCERIN, pentafluoroprop-tetrafluoroeth  Exam: General: WNWD elderly male, sleeping NAD  Heart: RRR 2/6 SEM  Lungs: Breathing unlabored. Lungs CTAB  Abdomen: soft NT/ND Extremities: R BKA, 1+  LLE edema  Dialysis Access: RUE AVF +bruit  Dialysis: [facility]   72kg 2K/2Ca  R AVF Hep 3000U Hectorol 1 mcg IV q HD Venofer 50 mg IV q week Mircera 100 mcg IV q 2 weeks (last dosed 10/9)   Assessment:  1. Noncompliance / uremia - missed 2 wks of HD. A little better today.  2. Chest pain/ICM  - per primary.  3. ESRD - TTS. HD again today then resume TTS sched 4. Anemia - Hgb 9.8. Resume Aranesp 100 with HD 10/30  5. MBD-  Cont VDRA/OP Phos at goal. No binders on outpatient list. Follow trend  6. HTN/volume - BP's good, close to dry today  7. Nutrition - Renal diet/viatmins  8. Aflutter - - amiodarone 9. Dementia - ? Able to make decisions for himself  Plan - as above   Vinson Moselleob Vermelle Cammarata MD [facility] pager [phone]   01/20/2017, 1:31 PM    Recent Labs Lab 01/15/17 1454 01/19/17 0749 01/20/17 0130  NA 141 142 139  K 5.1 5.5* 5.8*  CL 114* 114* 115*  CO2 15* 12* 11*  GLUCOSE 124* 102* 101*  BUN 90* 101* 106*  CREATININE 7.59* 8.48* 8.76*  CALCIUM 7.8* 8.2* 7.9*  PHOS  --   --  6.7*    Recent Labs Lab 01/20/17 0130  ALBUMIN 2.9*    Recent Labs Lab 01/15/17 1454 01/19/17 0749 01/20/17 0130  WBC 6.5 7.0 7.4  HGB 9.1* 9.8* 9.3*  HCT 29.7* 30.9* 29.1*  MCV 76.3* 76.5* 75.6*  PLT 121* 199 191   Iron/TIBC/Ferritin/ %Sat    Component Value Date/Time   IRON 52 06/12/2015 0438   IRON 38 10/26/2014 1356   TIBC 302 06/12/2015 0438   TIBC 246 (L) 10/26/2014 1356   FERRITIN 89 03/31/2015 0538   IRONPCTSAT 17 (L) 06/12/2015 0438   IRONPCTSAT 15 10/26/2014 1356

## 2017-01-20 NOTE — Care Management Note (Addendum)
Case Management Note  Patient Details  Name: [name] [name] MRN: [id number] Date of Birth: 03/09/1946  Subjective/Objective:                 Patient admitted from home w spouse for uremia after missing 4 HD sessions. Spoke w patient and wife. Asked what was barrier getting to last 4 HD sessions. Patient states. "lazy." Wife explains that they do like waiting for [facility] to pick him up from HD and he gets home too late. They state that they wish to change him from [place] [address] in hopes of better SCAT pick/ up delivery, and they are in the process of this. Explained to them that this is managed through the nephrologists and the kidney centers would have to ensure that there is a time slot so it may not be immediate. Spoke w them about the importance of not missing HD. No HH needs anticipated.    Action/Plan:  CM will continue to follow.  Expected Discharge Date:  01/21/17               Expected Discharge Plan:  Home/Self Care  In-House Referral:     Discharge planning Services  CM Consult  Post Acute Care Choice:    Choice offered to:     DME Arranged:    DME Agency:     HH Arranged:    HH Agency:     Status of Service:  In process, will continue to follow  If discussed at Long Length of Stay Meetings, dates discussed:    Additional Comments:  Lawerance SabalDebbie Eeva Schlosser, RN 01/20/2017, 10:08 AM

## 2017-01-20 NOTE — Progress Notes (Signed)
HD tx completed @ 0425 w/o problem other than increased bleeding time holding sites, UF goal met, blood rinsed back, VSS, report called to Arthor Captain, RN

## 2017-01-20 NOTE — Progress Notes (Signed)
   Subjective: patient seemed to be feeling better today, continued to deny chest pain, shortness of breath, nausea/vomiting.  He appears to be less argumentative today with his wife and others.  He seems somewhat willing to have another session of dialysis this morning.  He and his wife expressed their frustrations over trying to get established at the new dialysis center.  Objective:  Vital signs in last 24 hours: Vitals:   01/20/17 0400 01/20/17 0425 01/20/17 0505 01/20/17 0606  BP: (!) 146/77 (!) 130/95 135/83 132/75  Pulse: 60 62 84 88  Resp: (!) 21 18 18 18   Temp:   98 F (36.7 C) 97.9 F (36.6 C)  TempSrc:   Oral Oral  SpO2: 100% 100% 98% 100%  Weight:   160 lb 7.9 oz (72.8 kg)   Height:       Physical Exam  Constitutional: He is oriented to person, place, and time. He appears well-developed and well-nourished.  Eyes: Right eye exhibits no discharge. Left eye exhibits no discharge. No scleral icterus.  Cardiovascular: Normal rate, regular rhythm and intact distal pulses.  Exam reveals no gallop and no friction rub.   Murmur (2/6 systolic murmur heard best in tricuspid area) heard. Pulmonary/Chest: Effort normal and breath sounds normal. No respiratory distress. He has no wheezes. He has no rales.  Abdominal: Soft. Bowel sounds are normal. He exhibits no distension and no mass. There is no tenderness. There is no guarding.  Musculoskeletal: He exhibits no edema.  R aka, no prosthesis, left leg atrophied  Neurological: He is alert and oriented to person, place, and time.    Assessment/Plan:  Principal Problem:   Uremia  Uremia: pt oriented to self only but does have dementia baseline.    -Seems to have improved since one session of dialysis yesterday, has another session scheduled for this afternoon.  -Wife notices some improvement but feels pt still not back to baseline -Will repeat BMP after next dialysis session   SOB/Chest Pain: Pt came in with chest pain/SOB per  wife with exam and chest x-ray concerning for volume overload  -continues to deny today, wife feels that he is feeling better today however  Atrial flutter: stable, pt on amiodarone, has been in sinus rhythm for duration of admission  -continue amiodarone  Dispo: Anticipated discharge in approximately 1-2 day(s).   Angelita InglesWinfrey, Rhen B, MD 01/20/2017, 8:40 AM Thornell MuleBrandon Kalib Bhagat MD PGY-1 Internal Medicine Pager # [phone]

## 2017-01-20 NOTE — Discharge Instructions (Signed)
[name],  Your INR is too high right now. Please DO NOT TAKE WARFARIN until you follow up with the clinic downstairs to get your INR checked. Please call our clinic tomorrow at [phone] to schedule an appointment to get your INR checked for either Thursday Nov 1 or Friday Nov 2. At that time, the doctors will be able to tell you when to re-start your warfarin.

## 2017-01-20 NOTE — Progress Notes (Signed)
Dr. Frances FurbishWinfrey aware of INR 6.01.

## 2017-01-20 NOTE — Progress Notes (Signed)
HD tx initiated via 15Gx2 w/o problem, pull/push/flush equally w/o problem, VSS, will cont to monitor while on HD tx 

## 2017-01-20 NOTE — Progress Notes (Signed)
CRITICAL VALUE ALERT  Critical Value: INR 7. 02  Date & Time Notied:  10/30 0240  Provider Notified: Internal med on call  Orders Received/Actions taken: **awaiting new orders

## 2017-01-20 NOTE — Plan of Care (Signed)
Problem: Safety: Goal: Ability to remain free from injury will improve Outcome: Progressing Pt aware of need to call if he needs to get up. Family at bedside.

## 2017-01-21 LAB — HEPATITIS B SURFACE ANTIBODY,QUALITATIVE: Hep B S Ab: REACTIVE

## 2017-01-21 NOTE — Discharge Summary (Signed)
Name: [name] [name] MRN: [id number] DOB: [birth year] 71 y.o. PCP: Doneen Poisson, MD  Date of Admission: 01/19/2017  9:01 AM Attending Physician: Doneen Poisson  Discharge Diagnosis: 1. Uremia Principal Problem:   Uremia   Discharge Medications: Allergies as of 01/20/2017      Reactions   Lisinopril Other (See Comments)   Acute kidney injury   Amoxicillin Swelling, Other (See Comments)   Puffy eyes & abdominal pain Has patient had a PCN reaction causing immediate rash, facial/tongue/throat swelling, SOB or lightheadedness with hypotension: Yes Has patient had a PCN reaction causing severe rash involving mucus membranes or skin necrosis: Unknown Has patient had a PCN reaction that required hospitalization: Unknown Has patient had a PCN reaction occurring within the last 10 years: No   Pravastatin Other (See Comments)   Weakness and fatigue   Tamsulosin Other (See Comments)   dry throat, sweating, blurred vision   Doxycycline Rash, Other (See Comments)   Questionable drug rxn rash   Tape Rash   Medical tape       Medication List    STOP taking these medications   warfarin 2.5 MG tablet Commonly known as:  COUMADIN     TAKE these medications   acetaminophen 500 MG tablet Commonly known as:  TYLENOL Take 500-1,000 mg by mouth 2 (two) times daily as needed for moderate pain.   amiodarone 200 MG tablet Commonly known as:  PACERONE Take 1 tablet (200 mg total) by mouth daily.   atorvastatin 40 MG tablet Commonly known as:  LIPITOR Take 1 tablet (40 mg total) by mouth every evening.   calcium citrate-vitamin D 315-200 MG-UNIT tablet Commonly known as:  CITRACAL+D Take 1 tablet by mouth daily.   carvedilol 6.25 MG tablet Commonly known as:  COREG Take 1 tablet (6.25 mg total) by mouth 2 (two) times daily. What changed:  when to take this  additional instructions   famotidine 20 MG tablet Commonly known as:  PEPCID TAKE 1 TABLET BY MOUTH BEFORE  BREAKFAST AND 1 TABLET BEFORE SUPPER What changed:  See the new instructions.   fish oil-omega-3 fatty acids 1000 MG capsule Take 1,000 mg by mouth daily.   fluticasone 50 MCG/ACT nasal spray Commonly known as:  FLONASE Place 1 spray into both nostrils daily as needed for allergies.   gabapentin 300 MG capsule Commonly known as:  NEURONTIN Take 1 capsule (300 mg total) by mouth daily as needed (for stump pain).   hydroxypropyl methylcellulose / hypromellose 2.5 % ophthalmic solution Commonly known as:  ISOPTO TEARS / GONIOVISC Place 2 drops into both eyes 3 (three) times daily as needed for dry eyes. Reported on 04/20/2015   levothyroxine 25 MCG tablet Commonly known as:  SYNTHROID, LEVOTHROID Take 1 tablet (25 mcg total) by mouth daily before breakfast.   multivitamin with minerals tablet Take 1 tablet by mouth daily.   ondansetron 4 MG tablet Commonly known as:  ZOFRAN Take 1 tablet (4 mg total) by mouth every 8 (eight) hours as needed for nausea.       Disposition and follow-up:   [name] was discharged from [facility] in Stable condition.  At the hospital follow up visit please address:  1.   Uremia -Ensure pt is going to dialysis as scheduled -check and ensure pts mental status back to baseline -check BMP  Supratherapeutic INR -Test PT/INR and titrate warfarin as necessary -check for signs and symptoms of blood loss, check CBC  2.  Labs / imaging needed at time of follow-up: CBC, BMP  3.  Pending labs/ test needing follow-up: none  Follow-up Appointments: Follow-up Information    Doneen Poisson, MD Follow up.   Specialty:  Internal Medicine Contact information: [address] [place] [address] [phone]        [facility]. Schedule an appointment as soon as possible for a visit on 01/22/2017.   Why:  Please call the clinic tomorrow to schedule an appointment to get your INR checked for  either Thursday 11/1 or Friday 11/2. Do not take your warfarin until you follow up at this appointment. Contact information: [address] [place] [address] [phone]          Hospital Course by problem list: Principal Problem:   Uremia   1.  Uremia: patient presented to the emergency department with chest pain, he has missed his last 4 dialysis sessions.  He presented for the same complaint 4 days ago but left AGAINST MEDICAL ADVICE before receiving hemodialysis.  When we went to see him in the ED he was still trying to leave against medical advice but was too weak and was brought to the ED without his prosthesis.  He was frustrated, not oriented except to self, he had a normal physical exam other than hypertension and a 2/6 systolic murmur best heard in the tricuspid area.  He continued to deny chest pain when we asked as the admitting team although he had complained according to ED staff and received a nitro tab.  After a very long and tedious discussion and the patient continually arguing with his wife he was able to receive dialysis.  His PCP and patient's wife felt the patient was confused and not at his baseline mental status.  He received a total of two dialysis sessions and felt much better and slowly per the wife was reaching his baseline.  He appeared better to [place] as well and wants to  continue his dialysis outpatient for the time being with the understanding that if he later decides to stop dialysis when his mind is clear we would support his decision either way.  His troponin was positive at 0.12 but pt likely with troponin leak with known coronary disease and in the setting of missing about 2 weeks of dialysis.  His EKG was not suggestive of ACS.   Supratherapeutic INR: patient found to have INR of 5.31 on admission.  He had no bleeding and this was in the setting of being volume overloaded with heart failure and uremic.  His warfarin was held, he has a close follow  up appointment to recheck his INR.    Discharge Vitals:   BP (!) 118/56 (BP Location: Left Arm)   Pulse 89   Temp (!) 97.4 F (36.3 C) (Oral)   Resp 17   Ht 5\' 9"  (1.753 m)   Wt 154 lb 5.2 oz (70 kg)   SpO2 98%   BMI 22.79 kg/m   Pertinent Labs, Studies, and Procedures:  Lab Results  Component Value Date   INR 6.01 (HH) 01/20/2017   INR 7.02 (HH) 01/20/2017   INR 5.31 (HH) 01/19/2017   BMP Latest Ref Rng & Units 01/20/2017 01/19/2017 01/15/2017  Glucose 65 - 99 mg/dL 284(X) 324(M) 010(U)  BUN 6 - 20 mg/dL 725(D) 664(Q) 03(K)  Creatinine 0.61 - 1.24 mg/dL 7.42(V) 9.56(L) 8.75(I)  BUN/Creat Ratio 10 - 22 - - -  Sodium 135 - 145 mmol/L  139 142 141  Potassium 3.5 - 5.1 mmol/L 5.8(H) 5.5(H) 5.1  Chloride 101 - 111 mmol/L 115(H) 114(H) 114(H)  CO2 22 - 32 mmol/L 11(L) 12(L) 15(L)  Calcium 8.9 - 10.3 mg/dL 7.9(L) 8.2(L) 7.8(L)   CBC Latest Ref Rng & Units 01/20/2017 01/19/2017 01/15/2017  WBC 4.0 - 10.5 K/uL 7.4 7.0 6.5  Hemoglobin 13.0 - 17.0 g/dL 6.9(G9.3(L) 2.9(B9.8(L) 2.8(U9.1(L)  Hematocrit 39.0 - 52.0 % 29.1(L) 30.9(L) 29.7(L)  Platelets 150 - 400 K/uL 191 199 121(L)   Final [99] 01/19/2017 10:47 AM 01/19/2017 10:56 AM  PACS Images  Show images for DG Chest 2 View  Study Result  CLINICAL DATA:  Cardiomyopathy with hypertension.   EXAM: CHEST  2 VIEW   COMPARISON:  January 15, 2017   FINDINGS: There is a right pleural effusion with a minimal left pleural effusion. There is bibasilar atelectatic change. There is no consolidation. Heart is enlarged with pulmonary vascularity within normal limits. No adenopathy. There is calcification in each carotid artery. No evident bone lesions. There is a stent in the right innominate vein region.   IMPRESSION: Cardiomegaly. Pleural effusions bilaterally, larger on the right than on the left. Bibasilar atelectasis. No frank consolidation. Pulmonary vascular within normal limits.   There is carotid artery calcification bilaterally.    Hep B surf ag neg Hep B surf antibody pos  Brain natriuretic peptide  Order: [id number]  Status:  Final result   Visible to patient:  No (Not Released) Next appt:  03/13/2017 at 11:15 AM in Internal Medicine Josem Kaufmann(KLIMA, Smith MinceLAWRENCE D, MD)      Ref Range & Units 3d ago (01/19/17) 886yr ago (03/29/15) 2986yr ago (02/12/15) 6664yr ago (07/25/14)   B Natriuretic Peptide 0.0 - 100.0 pg/mL >4,500.0   >4,500.0   3,694.4   497.3          Troponin I  Order: [id number]  Status:  Final result   Visible to patient:  No (Not Released) Next appt:  03/13/2017 at 11:15 AM in Internal Medicine Josem Kaufmann(KLIMA, Smith MinceLAWRENCE D, MD)      Ref Range & Units 3d ago (01/19/17) 7086yr ago (03/30/15) 6186yr ago (03/30/15) 4686yr ago (03/30/15)   Troponin I <0.03 ng/mL 0.12   0.03R, CM  <0.03R, CM  <0.03R, CM   Comment: CRITICAL RESULT CALLED TO, READ BACK BY AND VERIFIED WITH:  J.Joaquin CourtsBAILEY,RN 1219 01/19/17 CLARK,S           Discharge Instructions: Discharge Instructions    Diet - low sodium heart healthy    Complete by:  As directed    Increase activity slowly    Complete by:  As directed       Signed: Angelita InglesWinfrey, Einar B, MD 01/22/2017, 10:21 AM

## 2017-01-23 ENCOUNTER — Other Ambulatory Visit (INDEPENDENT_AMBULATORY_CARE_PROVIDER_SITE_OTHER): Payer: Medicare Other

## 2017-01-23 ENCOUNTER — Ambulatory Visit (INDEPENDENT_AMBULATORY_CARE_PROVIDER_SITE_OTHER): Payer: Medicare Other | Admitting: Pharmacist

## 2017-01-23 DIAGNOSIS — Z7901 Long term (current) use of anticoagulants: Secondary | ICD-10-CM

## 2017-01-23 DIAGNOSIS — I4892 Unspecified atrial flutter: Secondary | ICD-10-CM | POA: Diagnosis not present

## 2017-01-23 DIAGNOSIS — I480 Paroxysmal atrial fibrillation: Secondary | ICD-10-CM

## 2017-01-23 LAB — PROTIME-INR
INR: 3.07
Prothrombin Time: 31.4 seconds — ABNORMAL HIGH (ref 11.4–15.2)

## 2017-01-23 NOTE — Progress Notes (Signed)
Anticoagulation Management [name] [name] is a 71 y.o. male who reports to the clinic for monitoring of warfarin treatment.    Indication: atrial fibrillation   Duration: indefinite Supervising physician: Jessy OtoAlexander Raines  Anticoagulation Clinic Visit History: Patient does not report signs/symptoms of bleeding or thromboembolism. Patient was in hospital 01/19/17 for uremia, but INR was 5.31, so warfarin has been held for the past 4 days. Also of note, patient has hemodialysis sessions Tue/Thu/Sat.  Anticoagulation Episode Summary    Current INR goal:   2.0-3.0  TTR:   52.3 % (1.5 y)  Next INR check:   01/26/2017  INR from last check:   3.07! (01/23/2017)  Weekly max warfarin dose:     Target end date:     INR check location:     Preferred lab:     Send INR reminders to:      Indications   Paroxysmal atrial flutter (HCC) [I48.92] Long term (current) use of anticoagulants [Z79.01] [Z79.01]       Comments:          Allergies  Allergen Reactions  . Lisinopril Other (See Comments)    Acute kidney injury  . Amoxicillin Swelling and Other (See Comments)    Puffy eyes & abdominal pain Has patient had a PCN reaction causing immediate rash, facial/tongue/throat swelling, SOB or lightheadedness with hypotension: Yes Has patient had a PCN reaction causing severe rash involving mucus membranes or skin necrosis: Unknown Has patient had a PCN reaction that required hospitalization: Unknown Has patient had a PCN reaction occurring within the last 10 years: No    . Pravastatin Other (See Comments)    Weakness and fatigue  . Tamsulosin Other (See Comments)    dry throat, sweating, blurred vision  . Doxycycline Rash and Other (See Comments)    Questionable drug rxn rash  . Tape Rash    Medical tape    Medication Sig  acetaminophen (TYLENOL) 500 MG tablet Take 500-1,000 mg by mouth 2 (two) times daily as needed for moderate pain.  amiodarone (PACERONE) 200 MG tablet Take 1 tablet  (200 mg total) by mouth daily.  atorvastatin (LIPITOR) 40 MG tablet Take 1 tablet (40 mg total) by mouth every evening.  calcium citrate-vitamin D (CITRACAL+D) 315-200 MG-UNIT per tablet Take 1 tablet by mouth daily.   carvedilol (COREG) 6.25 MG tablet Take 1 tablet (6.25 mg total) by mouth 2 (two) times daily. Patient taking differently: Take 6.25 mg by mouth See admin instructions. 6.25 mg TWO times a day on Sun/Mon/Wed/Fri and 6.25 ONCE a day in the evening on Tues/Thurs/Sat  famotidine (PEPCID) 20 MG tablet TAKE 1 TABLET BY MOUTH BEFORE BREAKFAST AND 1 TABLET BEFORE SUPPER Patient taking differently: Take 20 mg by mouth two times a day before meals (BREAKFAST and SUPPER)  fish oil-omega-3 fatty acids 1000 MG capsule Take 1,000 mg by mouth daily.   fluticasone (FLONASE) 50 MCG/ACT nasal spray Place 1 spray into both nostrils daily as needed for allergies.  gabapentin (NEURONTIN) 300 MG capsule Take 1 capsule (300 mg total) by mouth daily as needed (for stump pain).  hydroxypropyl methylcellulose (ISOPTO TEARS) 2.5 % ophthalmic solution Place 2 drops into both eyes 3 (three) times daily as needed for dry eyes. Reported on 04/20/2015  levothyroxine (SYNTHROID, LEVOTHROID) 25 MCG tablet Take 1 tablet (25 mcg total) by mouth daily before breakfast.  Multiple Vitamins-Minerals (MULTIVITAMIN WITH MINERALS) tablet Take 1 tablet by mouth daily.  ondansetron (ZOFRAN) 4 MG tablet Take 1 tablet (4  mg total) by mouth every 8 (eight) hours as needed for nausea.   Past Medical History:  Diagnosis Date  . Arthritis    "fingers" (02/07/2015)  . Atrial flutter (HCC) 07/26/2014   a. May 2016, CHA2DS2VASc = 4 -> Eliquis, spontaneous conversion to NSR;  b. On amio;  c. 01/2015 EPS: Unable to induce right sided Aflutter. Non-sustained Afib and Left sided Aflutter noted.  . Benign prostatic hyperplasia with elevated prostate specific antigen (PSA) 10/31/2016   PSA (08/2008): 8.38, PSA (05/2009): 6.63, PSA (11/2009):  13.50, PSA (12/2009): 13.70, PSA (12/2010) 11.99, PSA (06/2011): 11.88, PSA (12/2011): 13.93, PSA (12/2014): 13.49 TRUS/BX (10/2008) Prostate 54 cc, Pathology BPH with single core of chronic inflammation  . Benign prostatic hypertrophy with nocturia 07/07/2006  . Cardiomyopathy (HCC)    a. 07/2014 Echo: EF 50%; b. 03/2015 Ech: EF 20-25%; c. 07/2015 Echo: EF 30-35%, Gr1 DD, sev LVH.  [name] Chronic venous insufficiency 04/12/2010  . Closed displaced fracture of left femoral neck (HCC) 06/14/2014   s/p left hip hemiarthroplasty June 14, 2014   . Constipation 05/25/2009   Intermittent   . Coronary artery disease 04/02/2006   a. s/p RCA DES (2.5 x 24 mm TAXUS drug-eluting stent) 2006;  b. low risk myoview in 2011; c. 5/17 MV: EF 28%, large inf apical and inflat infarct w/o ischemia.  . Degenerative joint disease involving multiple joints 02/02/2007  . End stage renal disease on dialysis [facility])    a. TTS Dialysis - currently through right chest diatek, pending AVF.  [name] GERD (gastroesophageal reflux disease)   . Hepatitis    Hep C  . History of blood transfusion 05/2014   "related to hip OR"  . 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 Hyperlipidemia 04/02/2006  . Hypertension   . Hypertensive heart disease 12/09/2006  . Hypothyroidism   . Internal and external hemorrhoids without complication 08/20/2012  . Microcytic normochromic anemia 05/27/2006  . Obesity (BMI 30.0-34.9) 01/15/2012  . Phantom limb pain (HCC) 06/13/2016  . 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 Type 2 diabetes mellitus with  peripheral artery disease (HCC) 05/27/2006   Absent pulses in the left foot   . 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 Marital status: Married    Spouse name: N/A  . Number of children: N/A  . Years of education: N/A   Social History Main Topics  . Smoking status: Former Smoker    Packs/day: 1.00    Years: 10.00    Types: Cigarettes    Quit date: 06/16/1968  . Smokeless tobacco: Never Used  . Alcohol use No     Comment: "quit alcohol in the 1960's"  . Drug use: No  . Sexual activity: Not Currently    Birth control/ protection: None   Other Topics Concern  . Not on file   Social History Narrative  . No narrative on file   Family History  Problem Relation Age of Onset  . Hypertension Mother   . Heart attack Father   .  Breast cancer Sister   . Arthritis Sister   . Heart attack Brother   . Diabetes Brother   . Stroke Brother   . Pneumonia Daughter   . Diabetes Brother   . Alcoholism Brother   . Diabetes Brother   . Arthritis Sister        Bilateral knee replacement  . HIV Daughter   . Hypertension Daughter   . Drug abuse Daughter   . Schizophrenia Daughter   . Hypertension Daughter   . Drug abuse Daughter   . Bipolar disorder Daughter    ASSESSMENT Recent Results: Lab Results  Component Value Date   INR 3.07 01/23/2017   INR 6.01 (HH) 01/20/2017   INR 7.02 (HH) 01/20/2017   Anticoagulation Dosing: INR as of 01/23/2017 and Previous Warfarin Dosing Information    INR Dt INR Goal Wkly Tot Sun Mon Tue Wed Thu Fri Sat   01/23/2017 3.07 2.0-3.0 15 mg 5 mg 0 mg 0 mg 0 mg 0 mg 5 mg 5 mg   Patient deviated from recommended dosing.       Previous description   Take TWO (2) tablets of your 2.5mg  strength green warfarin tablets by mouth, once-daily at 6PM.   Anticoagulation Warfarin Dose Instructions as of 01/23/2017      Total Sun Mon Tue Wed Thu Fri Sat   New Dose 11.25 mg 3.75 mg 0 mg 0 mg  0 mg 0 mg 3.75 mg 3.75 mg     (2.5 mg x 1.5)  -  -  -  -  (2.5 mg x 1.5)  (2.5 mg x 1.5)     INR today: Therapeutic  PLAN Re-start warfarin with 1 and 1/2 tablet daily, return to clinic Monday for follow up  Patient Instructions  Patient educated about medication as defined in this encounter and verbalized understanding by repeating back instructions provided.   Patient advised to contact clinic or seek medical attention if signs/symptoms of bleeding or thromboembolism occur.  Patient verbalized understanding by repeating back information and was advised to contact me if further medication-related questions arise. Patient was also provided an information handout.  Follow-up Return in about 3 days (around 01/26/2017).  Marzetta Board

## 2017-01-23 NOTE — Patient Instructions (Signed)
Patient educated about medication as defined in this encounter and verbalized understanding by repeating back instructions provided.   

## 2017-01-26 ENCOUNTER — Other Ambulatory Visit: Payer: Self-pay | Admitting: Internal Medicine

## 2017-01-26 ENCOUNTER — Ambulatory Visit: Payer: Medicare Other

## 2017-01-26 DIAGNOSIS — I4892 Unspecified atrial flutter: Secondary | ICD-10-CM

## 2017-01-26 NOTE — Progress Notes (Signed)
INTERNAL MEDICINE TEACHING ATTENDING ADDENDUM  I agree with pharmacy recommendations as outlined in their note.   Alexander N Raines, MD  

## 2017-01-28 ENCOUNTER — Ambulatory Visit (INDEPENDENT_AMBULATORY_CARE_PROVIDER_SITE_OTHER): Payer: Medicare Other | Admitting: Pharmacist

## 2017-01-28 DIAGNOSIS — I480 Paroxysmal atrial fibrillation: Secondary | ICD-10-CM | POA: Diagnosis not present

## 2017-01-28 DIAGNOSIS — I4892 Unspecified atrial flutter: Secondary | ICD-10-CM

## 2017-01-28 DIAGNOSIS — Z7901 Long term (current) use of anticoagulants: Secondary | ICD-10-CM

## 2017-01-28 LAB — POCT INR: INR: 2.7

## 2017-01-28 NOTE — Progress Notes (Signed)
Anticoagulation Management [name] [name] a 71 y.o.malewho reports to the clinic for monitoring of warfarintreatment.   Indication: atrial fibrillation Duration: indefinite Supervising physician: Carlynn PurlErik Hoffman  Anticoagulation Clinic Visit History: Patient does notreport signs/symptoms of bleeding or thromboembolism. Patient was in hospital 01/19/17 for uremia, but INR was 5.31, so warfarin has been held X 4 days then re-started at 1.5 tablets (3.75 mg) daily x past 5 days. Also of note, patient has hemodialysis sessions Tue/Thu/Sat.   Anticoagulation Episode Summary    Current INR goal:   2.0-3.0  TTR:   52.6 % (1.5 y)  Next INR check:   02/11/2017  INR from last check:   2.7 (01/28/2017)  Weekly max warfarin dose:     Target end date:     INR check location:     Preferred lab:     Send INR reminders to:      Indications   Paroxysmal atrial flutter (HCC) [I48.92] Long term (current) use of anticoagulants [Z79.01] [Z79.01]       Comments:           Allergies  Allergen Reactions  . Lisinopril Other (See Comments)    Acute kidney injury  . Amoxicillin Swelling and Other (See Comments)    Puffy eyes & abdominal pain Has patient had a PCN reaction causing immediate rash, facial/tongue/throat swelling, SOB or lightheadedness with hypotension: Yes Has patient had a PCN reaction causing severe rash involving mucus membranes or skin necrosis: Unknown Has patient had a PCN reaction that required hospitalization: Unknown Has patient had a PCN reaction occurring within the last 10 years: No    . Pravastatin Other (See Comments)    Weakness and fatigue  . Tamsulosin Other (See Comments)    dry throat, sweating, blurred vision  . Doxycycline Rash and Other (See Comments)    Questionable drug rxn rash  . Tape Rash    Medical tape    Medication Sig  acetaminophen (TYLENOL) 500 MG tablet Take 500-1,000 mg by mouth 2 (two) times daily as needed for moderate pain.   amiodarone (PACERONE) 200 MG tablet Take 1 tablet (200 mg total) daily by mouth.  atorvastatin (LIPITOR) 40 MG tablet Take 1 tablet (40 mg total) by mouth every evening.  calcium citrate-vitamin D (CITRACAL+D) 315-200 MG-UNIT per tablet Take 1 tablet by mouth daily.   carvedilol (COREG) 6.25 MG tablet Take 1 tablet (6.25 mg total) by mouth 2 (two) times daily. Patient taking differently: Take 6.25 mg by mouth See admin instructions. 6.25 mg TWO times a day on Sun/Mon/Wed/Fri and 6.25 ONCE a day in the evening on Tues/Thurs/Sat  famotidine (PEPCID) 20 MG tablet TAKE 1 TABLET BY MOUTH BEFORE BREAKFAST AND 1 TABLET BEFORE SUPPER Patient taking differently: Take 20 mg by mouth two times a day before meals (BREAKFAST and SUPPER)  fish oil-omega-3 fatty acids 1000 MG capsule Take 1,000 mg by mouth daily.   fluticasone (FLONASE) 50 MCG/ACT nasal spray Place 1 spray into both nostrils daily as needed for allergies.  gabapentin (NEURONTIN) 300 MG capsule Take 1 capsule (300 mg total) by mouth daily as needed (for stump pain).  hydroxypropyl methylcellulose (ISOPTO TEARS) 2.5 % ophthalmic solution Place 2 drops into both eyes 3 (three) times daily as needed for dry eyes. Reported on 04/20/2015  levothyroxine (SYNTHROID, LEVOTHROID) 25 MCG tablet Take 1 tablet (25 mcg total) by mouth daily before breakfast.  Multiple Vitamins-Minerals (MULTIVITAMIN WITH MINERALS) tablet Take 1 tablet by mouth daily.  ondansetron (ZOFRAN) 4 MG tablet  Take 1 tablet (4 mg total) by mouth every 8 (eight) hours as needed for nausea.   Past Medical History:  Diagnosis Date  . Arthritis    "fingers" (02/07/2015)  . Atrial flutter (HCC) 07/26/2014   a. May 2016, CHA2DS2VASc = 4 -> Eliquis, spontaneous conversion to NSR;  b. On amio;  c. 01/2015 EPS: Unable to induce right sided Aflutter. Non-sustained Afib and Left sided Aflutter noted.  . Benign prostatic hyperplasia with elevated prostate specific antigen (PSA) 10/31/2016   PSA  (08/2008): 8.38, PSA (05/2009): 6.63, PSA (11/2009): 13.50, PSA (12/2009): 13.70, PSA (12/2010) 11.99, PSA (06/2011): 11.88, PSA (12/2011): 13.93, PSA (12/2014): 13.49 TRUS/BX (10/2008) Prostate 54 cc, Pathology BPH with single core of chronic inflammation  . Benign prostatic hypertrophy with nocturia 07/07/2006  . Cardiomyopathy (HCC)    a. 07/2014 Echo: EF 50%; b. 03/2015 Ech: EF 20-25%; c. 07/2015 Echo: EF 30-35%, Gr1 DD, sev LVH.  [name]. Chronic venous insufficiency 04/12/2010  . Closed displaced fracture of left femoral neck (HCC) 06/14/2014   s/p left hip hemiarthroplasty June 14, 2014   . Constipation 05/25/2009   Intermittent   . Coronary artery disease 04/02/2006   a. s/p RCA DES (2.5 x 24 mm TAXUS drug-eluting stent) 2006;  b. low risk myoview in 2011; c. 5/17 MV: EF 28%, large inf apical and inflat infarct w/o ischemia.  . Degenerative joint disease involving multiple joints 02/02/2007  . End stage renal disease on dialysis [facility](HCC)    a. TTS Dialysis - currently through right chest diatek, pending AVF.  [name]. GERD (gastroesophageal reflux disease)   . Hepatitis    Hep C  . History of blood transfusion 05/2014   "related to hip OR"  . 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 Hyperlipidemia 04/02/2006  . Hypertension   . Hypertensive heart disease 12/09/2006  . Hypothyroidism   . Internal and external hemorrhoids without complication 08/20/2012  . Microcytic normochromic anemia 05/27/2006  . Obesity (BMI 30.0-34.9) 01/15/2012  . Phantom limb pain (HCC) 06/13/2016  . 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 Type 2 diabetes mellitus with peripheral artery disease (HCC) 05/27/2006   Absent pulses in the left foot   . 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 Marital status: Married    Spouse name: Not on file  . Number of children: Not on file  . Years of education: Not on file  . Highest education level: Not on file  Social Needs  . Financial resource strain: Not on file  . Food insecurity - worry: Not on file  . Food insecurity - inability: Not on file  . Transportation needs - medical: Not on file  . Transportation needs - non-medical: Not on file  Occupational History  . Not on file  Tobacco Use  . Smoking status: Former Smoker    Packs/day: 1.00    Years: 10.00    Pack years: 10.00    Types: Cigarettes    Last attempt to quit: 06/16/1968    Years since  quitting: 48.6  . Smokeless tobacco: Never Used  Substance and Sexual Activity  . Alcohol use: No    Alcohol/week: 0.0 oz    Comment: "quit alcohol in the 1960's"  . Drug use: No  . Sexual activity: Not Currently    Birth control/protection: None  Other Topics Concern  . Not on file  Social History Narrative  . Not on file   Family History  Problem Relation Age of Onset  . Hypertension Mother   . Heart attack Father   . Breast cancer Sister   . Arthritis Sister   . Heart attack Brother   . Diabetes Brother   . Stroke Brother   . Pneumonia Daughter   . Diabetes Brother   . Alcoholism Brother   . Diabetes Brother   . Arthritis Sister        Bilateral knee replacement  . HIV Daughter   . Hypertension Daughter   . Drug abuse Daughter   . Schizophrenia Daughter   . Hypertension Daughter   . Drug abuse Daughter   . Bipolar disorder Daughter    ASSESSMENT Recent Results: Lab Results  Component Value Date   INR 2.7 01/28/2017   INR 3.07 01/23/2017   INR 6.01 (HH) 01/20/2017   Anticoagulation Dosing: 18.75  mg over the past 5 days (1.5 tablets daily x 5 days)   INR today: Therapeutic  PLAN Weekly dose was adjusted for a full week to 21.25 mg total (1 tablet daily except for 1.5 tablet on dialysis days-Tue/Thu/Sat)  Patient Instructions  Patient educated about medication as defined in this encounter and verbalized understanding by repeating back instructions provided.   Patient advised to contact clinic or seek medical attention if signs/symptoms of bleeding or thromboembolism occur.  Patient verbalized understanding by repeating back information and was advised to contact me if further medication-related questions arise. Patient was also provided an information handout.  Follow-up Return in about 12 days (around 02/09/2017).  Marzetta Board

## 2017-01-28 NOTE — Patient Instructions (Signed)
Patient educated about medication as defined in this encounter and verbalized understanding by repeating back instructions provided.   

## 2017-02-02 NOTE — Progress Notes (Signed)
INTERNAL MEDICINE TEACHING ATTENDING ADDENDUM - Gust RungHoffman, Emberlin Verner C, DO Duration- indefinate, Indication- a fib, INR-  Lab Results  Component Value Date   INR 2.7 01/28/2017  . Agree with pharmacy recommendations as outlined in their note.

## 2017-02-09 ENCOUNTER — Ambulatory Visit (INDEPENDENT_AMBULATORY_CARE_PROVIDER_SITE_OTHER): Payer: Medicare Other | Admitting: Pharmacist

## 2017-02-09 DIAGNOSIS — I4892 Unspecified atrial flutter: Secondary | ICD-10-CM | POA: Diagnosis not present

## 2017-02-09 DIAGNOSIS — Z7901 Long term (current) use of anticoagulants: Secondary | ICD-10-CM

## 2017-02-09 LAB — POCT INR: INR: 2.1

## 2017-02-09 NOTE — Patient Instructions (Signed)
Patient instructed to take medications as defined in the Anti-coagulation Track section of this encounter.  Patient instructed to take today's dose.  Patient instructed to take 1 of your 2.5mg  green-colored warfarin tablets on Mondays, Wednesdays and Fridays; all other days, take 1 & 1/2 of your 2.5mg  green-colored warfarin tablets.  Patient verbalized understanding of these instructions.

## 2017-02-09 NOTE — Progress Notes (Signed)
Anticoagulation Management [name] [name] is a 71 y.o. male who reports to the clinic for monitoring of warfarin treatment.    Indication: Paroxysmal atrial flutter (HCC) [148.92], long term (current) use of anticoagulants [Z79.01]. Duration: indefinite Supervising physician: Doneen PoissonLawrence Klima  Anticoagulation Clinic Visit History: Patient does not report signs/symptoms of bleeding or thromboembolism  Other recent changes: No diet, medications, lifestyle changes endorsed by the patient or wife to me.  Anticoagulation Episode Summary    Current INR goal:   2.0-3.0  TTR:   53.6 % (1.5 y)  Next INR check:   03/09/2017  INR from last check:   2.10 (02/09/2017)  Weekly max warfarin dose:     Target end date:     INR check location:     Preferred lab:     Send INR reminders to:      Indications   Paroxysmal atrial flutter (HCC) [I48.92] Long term (current) use of anticoagulants [Z79.01] [Z79.01]       Comments:           Allergies  Allergen Reactions  . Lisinopril Other (See Comments)    Acute kidney injury  . Amoxicillin Swelling and Other (See Comments)    Puffy eyes & abdominal pain Has patient had a PCN reaction causing immediate rash, facial/tongue/throat swelling, SOB or lightheadedness with hypotension: Yes Has patient had a PCN reaction causing severe rash involving mucus membranes or skin necrosis: Unknown Has patient had a PCN reaction that required hospitalization: Unknown Has patient had a PCN reaction occurring within the last 10 years: No    . Pravastatin Other (See Comments)    Weakness and fatigue  . Tamsulosin Other (See Comments)    dry throat, sweating, blurred vision  . Doxycycline Rash and Other (See Comments)    Questionable drug rxn rash  . Tape Rash    Medical tape    Prior to Admission medications   Medication Sig Start Date End Date Taking? Authorizing Provider  acetaminophen (TYLENOL) 500 MG tablet Take 500-1,000 mg by mouth 2 (two) times  daily as needed for moderate pain.   Yes [provider]  amiodarone (PACERONE) 200 MG tablet Take 1 tablet (200 mg total) daily by mouth. 01/26/17  Yes Doneen PoissonKlima, Lawrence, MD  atorvastatin (LIPITOR) 40 MG tablet Take 1 tablet (40 mg total) by mouth every evening. 12/12/16 03/12/17 Yes Doneen PoissonKlima, Lawrence, MD  calcium citrate-vitamin D (CITRACAL+D) 315-200 MG-UNIT per tablet Take 1 tablet by mouth daily.    Yes [provider]  carvedilol (COREG) 6.25 MG tablet Take 1 tablet (6.25 mg total) by mouth 2 (two) times daily. Patient taking differently: Take 6.25 mg by mouth See admin instructions. 6.25 mg TWO times a day on Sun/Mon/Wed/Fri and 6.25 ONCE a day in the evening on Tues/Thurs/Sat 06/30/16  Yes Valentino NoseBoswell, Nathan, MD  famotidine (PEPCID) 20 MG tablet TAKE 1 TABLET BY MOUTH BEFORE BREAKFAST AND 1 TABLET BEFORE SUPPER Patient taking differently: Take 20 mg by mouth two times a day before meals (BREAKFAST and SUPPER) 12/31/16  Yes Crenshaw, Madolyn FriezeBrian S, MD  fish oil-omega-3 fatty acids 1000 MG capsule Take 1,000 mg by mouth daily.  01/15/12  Yes Doneen PoissonKlima, Lawrence, MD  fluticasone (FLONASE) 50 MCG/ACT nasal spray Place 1 spray into both nostrils daily as needed for allergies. 12/12/16  Yes Doneen PoissonKlima, Lawrence, MD  gabapentin (NEURONTIN) 300 MG capsule Take 1 capsule (300 mg total) by mouth daily as needed (for stump pain). 06/13/16  Yes Doneen PoissonKlima, Lawrence, MD  hydroxypropyl methylcellulose (  ISOPTO TEARS) 2.5 % ophthalmic solution Place 2 drops into both eyes 3 (three) times daily as needed for dry eyes. Reported on 04/20/2015   Yes [provider]  levothyroxine (SYNTHROID, LEVOTHROID) 25 MCG tablet Take 1 tablet (25 mcg total) by mouth daily before breakfast. 09/17/16  Yes Doneen PoissonKlima, Lawrence, MD  Multiple Vitamins-Minerals (MULTIVITAMIN WITH MINERALS) tablet Take 1 tablet by mouth daily.   Yes [provider]  ondansetron (ZOFRAN) 4 MG tablet Take 1 tablet (4 mg total) by mouth every 8 (eight)  hours as needed for nausea. 03/03/16  Yes Doneen PoissonKlima, Lawrence, MD   Past Medical History:  Diagnosis Date  . Arthritis    "fingers" (02/07/2015)  . Atrial flutter (HCC) 07/26/2014   a. May 2016, CHA2DS2VASc = 4 -> Eliquis, spontaneous conversion to NSR;  b. On amio;  c. 01/2015 EPS: Unable to induce right sided Aflutter. Non-sustained Afib and Left sided Aflutter noted.  . Benign prostatic hyperplasia with elevated prostate specific antigen (PSA) 10/31/2016   PSA (08/2008): 8.38, PSA (05/2009): 6.63, PSA (11/2009): 13.50, PSA (12/2009): 13.70, PSA (12/2010) 11.99, PSA (06/2011): 11.88, PSA (12/2011): 13.93, PSA (12/2014): 13.49 TRUS/BX (10/2008) Prostate 54 cc, Pathology BPH with single core of chronic inflammation  . Benign prostatic hypertrophy with nocturia 07/07/2006  . Cardiomyopathy (HCC)    a. 07/2014 Echo: EF 50%; b. 03/2015 Ech: EF 20-25%; c. 07/2015 Echo: EF 30-35%, Gr1 DD, sev LVH.  [name]. Chronic venous insufficiency 04/12/2010  . Closed displaced fracture of left femoral neck (HCC) 06/14/2014   s/p left hip hemiarthroplasty June 14, 2014   . Constipation 05/25/2009   Intermittent   . Coronary artery disease 04/02/2006   a. s/p RCA DES (2.5 x 24 mm TAXUS drug-eluting stent) 2006;  b. low risk myoview in 2011; c. 5/17 MV: EF 28%, large inf apical and inflat infarct w/o ischemia.  . Degenerative joint disease involving multiple joints 02/02/2007  . End stage renal disease on dialysis [facility](HCC)    a. TTS Dialysis - currently through right chest diatek, pending AVF.  [name]. GERD (gastroesophageal reflux disease)   . Hepatitis    Hep C  . History of blood transfusion 05/2014   "related to hip OR"  . 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 Hyperlipidemia 04/02/2006  . Hypertension   . Hypertensive heart disease 12/09/2006  . Hypothyroidism   . Internal and external hemorrhoids without complication 08/20/2012  . Microcytic normochromic anemia 05/27/2006  . Obesity (BMI 30.0-34.9) 01/15/2012  .  Phantom limb pain (HCC) 06/13/2016  . 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 Type 2 diabetes mellitus with peripheral artery disease (HCC) 05/27/2006   Absent pulses in the left foot   . 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 Marital status: Married    Spouse name: Not on file  . Number of children: Not on file  . Years of education: Not on file  . Highest education level: Not on file  Social Needs  . Financial resource strain: Not on file  .  Food insecurity - worry: Not on file  . Food insecurity - inability: Not on file  . Transportation needs - medical: Not on file  . Transportation needs - non-medical: Not on file  Occupational History  . Not on file  Tobacco Use  . Smoking status: Former Smoker    Packs/day: 1.00    Years: 10.00    Pack years: 10.00    Types: Cigarettes    Last attempt to quit: 06/16/1968    Years since quitting: 48.6  . Smokeless tobacco: Never Used  Substance and Sexual Activity  . Alcohol use: No    Alcohol/week: 0.0 oz    Comment: "quit alcohol in the 1960's"  . Drug use: No  . Sexual activity: Not Currently    Birth control/protection: None  Other Topics Concern  . Not on file  Social History Narrative  . Not on file   Family History  Problem Relation Age of Onset  . Hypertension Mother   . Heart attack Father   . Breast cancer Sister   . Arthritis Sister   . Heart attack Brother   . Diabetes Brother   . Stroke Brother   . Pneumonia Daughter   . Diabetes  Brother   . Alcoholism Brother   . Diabetes Brother   . Arthritis Sister        Bilateral knee replacement  . HIV Daughter   . Hypertension Daughter   . Drug abuse Daughter   . Schizophrenia Daughter   . Hypertension Daughter   . Drug abuse Daughter   . Bipolar disorder Daughter     ASSESSMENT Recent Results: The most recent result is correlated with 21.25 mg per week: Lab Results  Component Value Date   INR 2.10 02/09/2017   INR 2.7 01/28/2017   INR 3.07 01/23/2017    Anticoagulation Dosing: Description   Take 1 tablet of your 2.5mg  green-colored warfarin tablets on Mondays, Wednesdays and Fridays; all other days of the week--take 1 & 1/2 tablets of your 2.5mg  green-colored warfarin tablets.      INR today: Therapeutic  PLAN Weekly dose was increased by 6% to 22.5 mg per week  Patient Instructions  Patient instructed to take medications as defined in the Anti-coagulation Track section of this encounter.  Patient instructed to take today's dose.  Patient instructed to take 1 of your 2.5mg  green-colored warfarin tablets on Mondays, Wednesdays and Fridays; all other days, take 1 & 1/2 of your 2.5mg  green-colored warfarin tablets.  Patient verbalized understanding of these instructions.     Patient advised to contact clinic or seek medical attention if signs/symptoms of bleeding or thromboembolism occur.  Patient verbalized understanding by repeating back information and was advised to contact me if further medication-related questions arise. Patient was also provided an information handout.  Follow-up Return in about 4 weeks (around 03/09/2017) for Follow up INR at 1015h.  Elicia Lamp, PharmD, CACP, CPP  15 minutes spent face-to-face with the patient during the encounter. 50% of time spent on education. 50% of time was spent on fingerstick point of care INR sample collection, processing, results determination, dose adjustment, documentation in Epic/CH[facility] and  [email or web address].

## 2017-02-09 NOTE — Progress Notes (Signed)
Indication: Paroxysmal atrial flutter. Duration: Indefinite. INR: At target. Agree with Dr. Saralyn PilarGroce's assessment and plan.

## 2017-02-16 ENCOUNTER — Telehealth: Payer: Self-pay

## 2017-02-16 NOTE — Telephone Encounter (Signed)
Pt wife needs to speak with a nurse about gabapentin (NEURONTIN) 300 MG capsule. Requesting a lower dosage, please call back.

## 2017-02-17 NOTE — Telephone Encounter (Signed)
Pt's wife request gabapentin be changed, pt does not take daily only when his phantom pain happens and then he becomes very sleepy and sleeps for a long amount of time. Pt is not going to dialysis at this time. He has appt fri w/ dr Josem Kaufmannklima at [phone]

## 2017-02-17 NOTE — Telephone Encounter (Signed)
[name] is already on gabapentin 300 mg daily as needed with only #30 provided per refill.  I will discuss a lower dose (100 mg) when I see him Friday morning and change the prescription at that time if they would like.

## 2017-02-18 ENCOUNTER — Inpatient Hospital Stay (HOSPITAL_COMMUNITY)
Admission: EM | Admit: 2017-02-18 | Discharge: 2017-02-21 | DRG: 871 | Disposition: E | Payer: Medicare Other | Attending: Pulmonary Disease | Admitting: Pulmonary Disease

## 2017-02-18 DIAGNOSIS — N401 Enlarged prostate with lower urinary tract symptoms: Secondary | ICD-10-CM | POA: Diagnosis present

## 2017-02-18 DIAGNOSIS — Z9049 Acquired absence of other specified parts of digestive tract: Secondary | ICD-10-CM

## 2017-02-18 DIAGNOSIS — Z96642 Presence of left artificial hip joint: Secondary | ICD-10-CM | POA: Diagnosis present

## 2017-02-18 DIAGNOSIS — Z8261 Family history of arthritis: Secondary | ICD-10-CM

## 2017-02-18 DIAGNOSIS — Z87891 Personal history of nicotine dependence: Secondary | ICD-10-CM

## 2017-02-18 DIAGNOSIS — D696 Thrombocytopenia, unspecified: Secondary | ICD-10-CM | POA: Diagnosis present

## 2017-02-18 DIAGNOSIS — I2510 Atherosclerotic heart disease of native coronary artery without angina pectoris: Secondary | ICD-10-CM | POA: Diagnosis present

## 2017-02-18 DIAGNOSIS — R972 Elevated prostate specific antigen [PSA]: Secondary | ICD-10-CM | POA: Diagnosis present

## 2017-02-18 DIAGNOSIS — I468 Cardiac arrest due to other underlying condition: Secondary | ICD-10-CM | POA: Diagnosis not present

## 2017-02-18 DIAGNOSIS — Z888 Allergy status to other drugs, medicaments and biological substances status: Secondary | ICD-10-CM

## 2017-02-18 DIAGNOSIS — Z7951 Long term (current) use of inhaled steroids: Secondary | ICD-10-CM

## 2017-02-18 DIAGNOSIS — Z89611 Acquired absence of right leg above knee: Secondary | ICD-10-CM

## 2017-02-18 DIAGNOSIS — E1151 Type 2 diabetes mellitus with diabetic peripheral angiopathy without gangrene: Secondary | ICD-10-CM | POA: Diagnosis present

## 2017-02-18 DIAGNOSIS — Z833 Family history of diabetes mellitus: Secondary | ICD-10-CM

## 2017-02-18 DIAGNOSIS — A419 Sepsis, unspecified organism: Secondary | ICD-10-CM | POA: Diagnosis not present

## 2017-02-18 DIAGNOSIS — R001 Bradycardia, unspecified: Secondary | ICD-10-CM | POA: Diagnosis present

## 2017-02-18 DIAGNOSIS — E8889 Other specified metabolic disorders: Secondary | ICD-10-CM | POA: Diagnosis present

## 2017-02-18 DIAGNOSIS — E113493 Type 2 diabetes mellitus with severe nonproliferative diabetic retinopathy without macular edema, bilateral: Secondary | ICD-10-CM | POA: Diagnosis present

## 2017-02-18 DIAGNOSIS — Z7189 Other specified counseling: Secondary | ICD-10-CM

## 2017-02-18 DIAGNOSIS — E8770 Fluid overload, unspecified: Secondary | ICD-10-CM | POA: Diagnosis present

## 2017-02-18 DIAGNOSIS — D631 Anemia in chronic kidney disease: Secondary | ICD-10-CM | POA: Diagnosis present

## 2017-02-18 DIAGNOSIS — G546 Phantom limb syndrome with pain: Secondary | ICD-10-CM | POA: Diagnosis present

## 2017-02-18 DIAGNOSIS — E860 Dehydration: Secondary | ICD-10-CM | POA: Diagnosis present

## 2017-02-18 DIAGNOSIS — Z955 Presence of coronary angioplasty implant and graft: Secondary | ICD-10-CM

## 2017-02-18 DIAGNOSIS — I480 Paroxysmal atrial fibrillation: Secondary | ICD-10-CM | POA: Diagnosis present

## 2017-02-18 DIAGNOSIS — Z880 Allergy status to penicillin: Secondary | ICD-10-CM

## 2017-02-18 DIAGNOSIS — R4182 Altered mental status, unspecified: Secondary | ICD-10-CM

## 2017-02-18 DIAGNOSIS — E875 Hyperkalemia: Secondary | ICD-10-CM

## 2017-02-18 DIAGNOSIS — I959 Hypotension, unspecified: Secondary | ICD-10-CM | POA: Diagnosis present

## 2017-02-18 DIAGNOSIS — Z992 Dependence on renal dialysis: Secondary | ICD-10-CM

## 2017-02-18 DIAGNOSIS — Z881 Allergy status to other antibiotic agents status: Secondary | ICD-10-CM

## 2017-02-18 DIAGNOSIS — I1311 Hypertensive heart and chronic kidney disease without heart failure, with stage 5 chronic kidney disease, or end stage renal disease: Secondary | ICD-10-CM | POA: Diagnosis present

## 2017-02-18 DIAGNOSIS — K219 Gastro-esophageal reflux disease without esophagitis: Secondary | ICD-10-CM | POA: Diagnosis present

## 2017-02-18 DIAGNOSIS — I472 Ventricular tachycardia: Secondary | ICD-10-CM | POA: Diagnosis present

## 2017-02-18 DIAGNOSIS — Z9115 Patient's noncompliance with renal dialysis: Secondary | ICD-10-CM

## 2017-02-18 DIAGNOSIS — I872 Venous insufficiency (chronic) (peripheral): Secondary | ICD-10-CM | POA: Diagnosis present

## 2017-02-18 DIAGNOSIS — F0390 Unspecified dementia without behavioral disturbance: Secondary | ICD-10-CM | POA: Diagnosis present

## 2017-02-18 DIAGNOSIS — E039 Hypothyroidism, unspecified: Secondary | ICD-10-CM | POA: Diagnosis present

## 2017-02-18 DIAGNOSIS — Z515 Encounter for palliative care: Secondary | ICD-10-CM

## 2017-02-18 DIAGNOSIS — Z79899 Other long term (current) drug therapy: Secondary | ICD-10-CM

## 2017-02-18 DIAGNOSIS — J9601 Acute respiratory failure with hypoxia: Secondary | ICD-10-CM | POA: Diagnosis not present

## 2017-02-18 DIAGNOSIS — G9340 Encephalopathy, unspecified: Secondary | ICD-10-CM | POA: Diagnosis present

## 2017-02-18 DIAGNOSIS — E785 Hyperlipidemia, unspecified: Secondary | ICD-10-CM | POA: Diagnosis present

## 2017-02-18 DIAGNOSIS — Z7901 Long term (current) use of anticoagulants: Secondary | ICD-10-CM

## 2017-02-18 DIAGNOSIS — Z79890 Hormone replacement therapy (postmenopausal): Secondary | ICD-10-CM

## 2017-02-18 DIAGNOSIS — Z8249 Family history of ischemic heart disease and other diseases of the circulatory system: Secondary | ICD-10-CM

## 2017-02-18 DIAGNOSIS — E1122 Type 2 diabetes mellitus with diabetic chronic kidney disease: Secondary | ICD-10-CM | POA: Diagnosis present

## 2017-02-18 DIAGNOSIS — N186 End stage renal disease: Secondary | ICD-10-CM

## 2017-02-18 DIAGNOSIS — R748 Abnormal levels of other serum enzymes: Secondary | ICD-10-CM | POA: Diagnosis present

## 2017-02-18 DIAGNOSIS — B182 Chronic viral hepatitis C: Secondary | ICD-10-CM | POA: Diagnosis present

## 2017-02-18 DIAGNOSIS — Z66 Do not resuscitate: Secondary | ICD-10-CM | POA: Diagnosis present

## 2017-02-18 DIAGNOSIS — E1141 Type 2 diabetes mellitus with diabetic mononeuropathy: Secondary | ICD-10-CM | POA: Diagnosis present

## 2017-02-18 DIAGNOSIS — I700 Atherosclerosis of aorta: Secondary | ICD-10-CM | POA: Diagnosis present

## 2017-02-18 DIAGNOSIS — E11649 Type 2 diabetes mellitus with hypoglycemia without coma: Secondary | ICD-10-CM | POA: Diagnosis present

## 2017-02-18 DIAGNOSIS — J302 Other seasonal allergic rhinitis: Secondary | ICD-10-CM | POA: Diagnosis present

## 2017-02-18 DIAGNOSIS — G9341 Metabolic encephalopathy: Secondary | ICD-10-CM | POA: Diagnosis present

## 2017-02-18 DIAGNOSIS — Z91048 Other nonmedicinal substance allergy status: Secondary | ICD-10-CM

## 2017-02-18 DIAGNOSIS — Z4659 Encounter for fitting and adjustment of other gastrointestinal appliance and device: Secondary | ICD-10-CM

## 2017-02-18 DIAGNOSIS — E871 Hypo-osmolality and hyponatremia: Secondary | ICD-10-CM | POA: Diagnosis present

## 2017-02-19 ENCOUNTER — Emergency Department (HOSPITAL_COMMUNITY): Payer: Medicare Other

## 2017-02-19 ENCOUNTER — Other Ambulatory Visit: Payer: Self-pay

## 2017-02-19 ENCOUNTER — Inpatient Hospital Stay (HOSPITAL_COMMUNITY): Payer: Medicare Other

## 2017-02-19 ENCOUNTER — Encounter (HOSPITAL_COMMUNITY): Payer: Self-pay | Admitting: *Deleted

## 2017-02-19 DIAGNOSIS — I4891 Unspecified atrial fibrillation: Secondary | ICD-10-CM | POA: Insufficient documentation

## 2017-02-19 DIAGNOSIS — I2510 Atherosclerotic heart disease of native coronary artery without angina pectoris: Secondary | ICD-10-CM

## 2017-02-19 DIAGNOSIS — E785 Hyperlipidemia, unspecified: Secondary | ICD-10-CM | POA: Diagnosis present

## 2017-02-19 DIAGNOSIS — Z888 Allergy status to other drugs, medicaments and biological substances status: Secondary | ICD-10-CM | POA: Diagnosis not present

## 2017-02-19 DIAGNOSIS — Z881 Allergy status to other antibiotic agents status: Secondary | ICD-10-CM

## 2017-02-19 DIAGNOSIS — Z89511 Acquired absence of right leg below knee: Secondary | ICD-10-CM | POA: Diagnosis not present

## 2017-02-19 DIAGNOSIS — J9601 Acute respiratory failure with hypoxia: Secondary | ICD-10-CM | POA: Diagnosis not present

## 2017-02-19 DIAGNOSIS — N401 Enlarged prostate with lower urinary tract symptoms: Secondary | ICD-10-CM | POA: Diagnosis present

## 2017-02-19 DIAGNOSIS — I1311 Hypertensive heart and chronic kidney disease without heart failure, with stage 5 chronic kidney disease, or end stage renal disease: Secondary | ICD-10-CM | POA: Diagnosis present

## 2017-02-19 DIAGNOSIS — R4182 Altered mental status, unspecified: Secondary | ICD-10-CM | POA: Diagnosis not present

## 2017-02-19 DIAGNOSIS — Z515 Encounter for palliative care: Secondary | ICD-10-CM | POA: Diagnosis not present

## 2017-02-19 DIAGNOSIS — I429 Cardiomyopathy, unspecified: Secondary | ICD-10-CM

## 2017-02-19 DIAGNOSIS — N186 End stage renal disease: Secondary | ICD-10-CM

## 2017-02-19 DIAGNOSIS — Z91048 Other nonmedicinal substance allergy status: Secondary | ICD-10-CM

## 2017-02-19 DIAGNOSIS — G9340 Encephalopathy, unspecified: Secondary | ICD-10-CM | POA: Diagnosis not present

## 2017-02-19 DIAGNOSIS — I4892 Unspecified atrial flutter: Secondary | ICD-10-CM

## 2017-02-19 DIAGNOSIS — Z823 Family history of stroke: Secondary | ICD-10-CM

## 2017-02-19 DIAGNOSIS — Z992 Dependence on renal dialysis: Secondary | ICD-10-CM

## 2017-02-19 DIAGNOSIS — E1122 Type 2 diabetes mellitus with diabetic chronic kidney disease: Secondary | ICD-10-CM | POA: Diagnosis present

## 2017-02-19 DIAGNOSIS — Z9115 Patient's noncompliance with renal dialysis: Secondary | ICD-10-CM

## 2017-02-19 DIAGNOSIS — Z66 Do not resuscitate: Secondary | ICD-10-CM | POA: Diagnosis present

## 2017-02-19 DIAGNOSIS — E1151 Type 2 diabetes mellitus with diabetic peripheral angiopathy without gangrene: Secondary | ICD-10-CM | POA: Diagnosis present

## 2017-02-19 DIAGNOSIS — I468 Cardiac arrest due to other underlying condition: Secondary | ICD-10-CM | POA: Diagnosis not present

## 2017-02-19 DIAGNOSIS — I480 Paroxysmal atrial fibrillation: Secondary | ICD-10-CM | POA: Diagnosis present

## 2017-02-19 DIAGNOSIS — I472 Ventricular tachycardia: Secondary | ICD-10-CM | POA: Diagnosis present

## 2017-02-19 DIAGNOSIS — E875 Hyperkalemia: Secondary | ICD-10-CM | POA: Diagnosis present

## 2017-02-19 DIAGNOSIS — Z87891 Personal history of nicotine dependence: Secondary | ICD-10-CM

## 2017-02-19 DIAGNOSIS — E1141 Type 2 diabetes mellitus with diabetic mononeuropathy: Secondary | ICD-10-CM | POA: Diagnosis present

## 2017-02-19 DIAGNOSIS — E11649 Type 2 diabetes mellitus with hypoglycemia without coma: Secondary | ICD-10-CM | POA: Diagnosis present

## 2017-02-19 DIAGNOSIS — Z833 Family history of diabetes mellitus: Secondary | ICD-10-CM

## 2017-02-19 DIAGNOSIS — A419 Sepsis, unspecified organism: Secondary | ICD-10-CM | POA: Insufficient documentation

## 2017-02-19 DIAGNOSIS — Z7189 Other specified counseling: Secondary | ICD-10-CM

## 2017-02-19 DIAGNOSIS — I872 Venous insufficiency (chronic) (peripheral): Secondary | ICD-10-CM | POA: Diagnosis present

## 2017-02-19 DIAGNOSIS — E860 Dehydration: Secondary | ICD-10-CM | POA: Diagnosis present

## 2017-02-19 DIAGNOSIS — K219 Gastro-esophageal reflux disease without esophagitis: Secondary | ICD-10-CM | POA: Diagnosis present

## 2017-02-19 DIAGNOSIS — R972 Elevated prostate specific antigen [PSA]: Secondary | ICD-10-CM | POA: Diagnosis present

## 2017-02-19 DIAGNOSIS — Z8249 Family history of ischemic heart disease and other diseases of the circulatory system: Secondary | ICD-10-CM

## 2017-02-19 DIAGNOSIS — E8770 Fluid overload, unspecified: Secondary | ICD-10-CM | POA: Diagnosis present

## 2017-02-19 DIAGNOSIS — E871 Hypo-osmolality and hyponatremia: Secondary | ICD-10-CM | POA: Diagnosis present

## 2017-02-19 DIAGNOSIS — G9341 Metabolic encephalopathy: Secondary | ICD-10-CM | POA: Diagnosis present

## 2017-02-19 LAB — CBC WITH DIFFERENTIAL/PLATELET
BASOS ABS: 0 10*3/uL (ref 0.0–0.1)
BASOS PCT: 0 %
BASOS PCT: 0 %
Basophils Absolute: 0 10*3/uL (ref 0.0–0.1)
Eosinophils Absolute: 0.1 10*3/uL (ref 0.0–0.7)
Eosinophils Absolute: 0.1 10*3/uL (ref 0.0–0.7)
Eosinophils Relative: 1 %
Eosinophils Relative: 2 %
HEMATOCRIT: 31.1 % — AB (ref 39.0–52.0)
HEMATOCRIT: 33 % — AB (ref 39.0–52.0)
HEMOGLOBIN: 10.4 g/dL — AB (ref 13.0–17.0)
HEMOGLOBIN: 9.8 g/dL — AB (ref 13.0–17.0)
LYMPHS ABS: 1.3 10*3/uL (ref 0.7–4.0)
Lymphocytes Relative: 28 %
Lymphocytes Relative: 28 %
Lymphs Abs: 1.5 10*3/uL (ref 0.7–4.0)
MCH: 24.4 pg — ABNORMAL LOW (ref 26.0–34.0)
MCH: 24.5 pg — AB (ref 26.0–34.0)
MCHC: 31.5 g/dL (ref 30.0–36.0)
MCHC: 31.5 g/dL (ref 30.0–36.0)
MCV: 77.8 fL — ABNORMAL LOW (ref 78.0–100.0)
MCV: 77.3 fL — ABNORMAL LOW (ref 78.0–100.0)
MONOS PCT: 12 %
Monocytes Absolute: 0.6 10*3/uL (ref 0.1–1.0)
Monocytes Absolute: 0.5 10*3/uL (ref 0.1–1.0)
Monocytes Relative: 8 %
NEUTROS ABS: 2.8 10*3/uL (ref 1.7–7.7)
NEUTROS ABS: 3.5 10*3/uL (ref 1.7–7.7)
NEUTROS PCT: 59 %
NEUTROS PCT: 62 %
Platelets: 83 10*3/uL — ABNORMAL LOW (ref 150–400)
Platelets: 52 10*3/uL — ABNORMAL LOW (ref 150–400)
RBC: 4 MIL/uL — AB (ref 4.22–5.81)
RBC: 4.27 MIL/uL (ref 4.22–5.81)
RDW: 17 % — ABNORMAL HIGH (ref 11.5–15.5)
RDW: 17.1 % — ABNORMAL HIGH (ref 11.5–15.5)
WBC: 4.7 10*3/uL (ref 4.0–10.5)
WBC: 5.6 10*3/uL (ref 4.0–10.5)

## 2017-02-19 LAB — BASIC METABOLIC PANEL
Anion gap: 8 (ref 5–15)
BUN: 98 mg/dL — AB (ref 6–20)
CHLORIDE: 118 mmol/L — AB (ref 101–111)
CO2: 16 mmol/L — AB (ref 22–32)
Calcium: 8.1 mg/dL — ABNORMAL LOW (ref 8.9–10.3)
Creatinine, Ser: 8.69 mg/dL — ABNORMAL HIGH (ref 0.61–1.24)
GFR calc Af Amer: 6 mL/min — ABNORMAL LOW (ref >60)
GFR calc non Af Amer: 5 mL/min — ABNORMAL LOW (ref >60)
GLUCOSE: 139 mg/dL — AB (ref 65–99)
POTASSIUM: 4.4 mmol/L (ref 3.5–5.1)
SODIUM: 142 mmol/L (ref 135–145)

## 2017-02-19 LAB — I-STAT CHEM 8, ED
BUN: 124 mg/dL — ABNORMAL HIGH (ref 6–20)
CALCIUM ION: 1.03 mmol/L — AB (ref 1.15–1.40)
Chloride: 125 mmol/L — ABNORMAL HIGH (ref 101–111)
Creatinine, Ser: 12.5 mg/dL — ABNORMAL HIGH (ref 0.61–1.24)
Glucose, Bld: 65 mg/dL (ref 65–99)
HEMATOCRIT: 33 % — AB (ref 39.0–52.0)
Hemoglobin: 11.2 g/dL — ABNORMAL LOW (ref 13.0–17.0)
Potassium: 6.6 mmol/L (ref 3.5–5.1)
SODIUM: 149 mmol/L — AB (ref 135–145)
TCO2: 12 mmol/L — AB (ref 22–32)

## 2017-02-19 LAB — URINALYSIS, ROUTINE W REFLEX MICROSCOPIC
BILIRUBIN URINE: NEGATIVE
Glucose, UA: NEGATIVE mg/dL
HGB URINE DIPSTICK: NEGATIVE
KETONES UR: NEGATIVE mg/dL
Leukocytes, UA: NEGATIVE
NITRITE: NEGATIVE
PH: 5 (ref 5.0–8.0)
Protein, ur: NEGATIVE mg/dL
SPECIFIC GRAVITY, URINE: 1.013 (ref 1.005–1.030)

## 2017-02-19 LAB — BLOOD CULTURE ID PANEL (REFLEXED)
Acinetobacter baumannii: NOT DETECTED
CANDIDA TROPICALIS: NOT DETECTED
Candida albicans: NOT DETECTED
Candida glabrata: NOT DETECTED
Candida krusei: NOT DETECTED
Candida parapsilosis: NOT DETECTED
Enterobacter cloacae complex: NOT DETECTED
Enterobacteriaceae species: NOT DETECTED
Enterococcus species: NOT DETECTED
Escherichia coli: NOT DETECTED
HAEMOPHILUS INFLUENZAE: NOT DETECTED
KLEBSIELLA OXYTOCA: NOT DETECTED
KLEBSIELLA PNEUMONIAE: NOT DETECTED
Listeria monocytogenes: NOT DETECTED
METHICILLIN RESISTANCE: DETECTED — AB
NEISSERIA MENINGITIDIS: NOT DETECTED
Proteus species: NOT DETECTED
Pseudomonas aeruginosa: NOT DETECTED
SERRATIA MARCESCENS: NOT DETECTED
STAPHYLOCOCCUS AUREUS BCID: NOT DETECTED
STAPHYLOCOCCUS SPECIES: DETECTED — AB
STREPTOCOCCUS SPECIES: NOT DETECTED
Streptococcus agalactiae: NOT DETECTED
Streptococcus pneumoniae: NOT DETECTED
Streptococcus pyogenes: NOT DETECTED

## 2017-02-19 LAB — COMPREHENSIVE METABOLIC PANEL
ALBUMIN: 2.9 g/dL — AB (ref 3.5–5.0)
ALK PHOS: 67 U/L (ref 38–126)
ALT: 11 U/L — ABNORMAL LOW (ref 17–63)
ALT: 18 U/L (ref 17–63)
ANION GAP: 10 (ref 5–15)
AST: 15 U/L (ref 15–41)
AST: 39 U/L (ref 15–41)
Albumin: 2.6 g/dL — ABNORMAL LOW (ref 3.5–5.0)
Alkaline Phosphatase: 55 U/L (ref 38–126)
Anion gap: 9 (ref 5–15)
BILIRUBIN TOTAL: 1 mg/dL (ref 0.3–1.2)
BUN: 152 mg/dL — ABNORMAL HIGH (ref 6–20)
BUN: 107 mg/dL — ABNORMAL HIGH (ref 6–20)
CALCIUM: 8.3 mg/dL — AB (ref 8.9–10.3)
CO2: 11 mmol/L — AB (ref 22–32)
CO2: 13 mmol/L — ABNORMAL LOW (ref 22–32)
CREATININE: 8.84 mg/dL — AB (ref 0.61–1.24)
Calcium: 7.9 mg/dL — ABNORMAL LOW (ref 8.9–10.3)
Chloride: 123 mmol/L — ABNORMAL HIGH (ref 101–111)
Chloride: 121 mmol/L — ABNORMAL HIGH (ref 101–111)
Creatinine, Ser: 12.16 mg/dL — ABNORMAL HIGH (ref 0.61–1.24)
GFR calc non Af Amer: 4 mL/min — ABNORMAL LOW (ref >60)
GFR, EST AFRICAN AMERICAN: 4 mL/min — AB (ref >60)
GFR, EST AFRICAN AMERICAN: 6 mL/min — AB (ref >60)
GFR, EST NON AFRICAN AMERICAN: 5 mL/min — AB (ref >60)
GLUCOSE: 64 mg/dL — AB (ref 65–99)
Glucose, Bld: 122 mg/dL — ABNORMAL HIGH (ref 65–99)
POTASSIUM: 6.6 mmol/L — AB (ref 3.5–5.1)
Potassium: 5.4 mmol/L — ABNORMAL HIGH (ref 3.5–5.1)
SODIUM: 144 mmol/L (ref 135–145)
Sodium: 143 mmol/L (ref 135–145)
Total Bilirubin: 1 mg/dL (ref 0.3–1.2)
Total Protein: 7.4 g/dL (ref 6.5–8.1)
Total Protein: 7 g/dL (ref 6.5–8.1)

## 2017-02-19 LAB — AMMONIA: Ammonia: 23 umol/L (ref 9–35)

## 2017-02-19 LAB — GLUCOSE, CAPILLARY
Glucose-Capillary: 110 mg/dL — ABNORMAL HIGH (ref 65–99)
Glucose-Capillary: 204 mg/dL — ABNORMAL HIGH (ref 65–99)

## 2017-02-19 LAB — BLOOD GAS, ARTERIAL
ACID-BASE DEFICIT: 7.6 mmol/L — AB (ref 0.0–2.0)
BICARBONATE: 17.2 mmol/L — AB (ref 20.0–28.0)
Drawn by: 517021
FIO2: 100
LHR: 15 {breaths}/min
O2 Saturation: 95.3 %
PATIENT TEMPERATURE: 98.6
PCO2 ART: 33.3 mmHg (ref 32.0–48.0)
PEEP/CPAP: 5 cmH2O
PO2 ART: 82.1 mmHg — AB (ref 83.0–108.0)
VT: 600 mL
pH, Arterial: 7.333 — ABNORMAL LOW (ref 7.350–7.450)

## 2017-02-19 LAB — MAGNESIUM: Magnesium: 2.1 mg/dL (ref 1.7–2.4)

## 2017-02-19 LAB — MRSA PCR SCREENING: MRSA BY PCR: NEGATIVE

## 2017-02-19 LAB — PROTIME-INR
INR: 2.61
Prothrombin Time: 27.7 seconds — ABNORMAL HIGH (ref 11.4–15.2)

## 2017-02-19 LAB — I-STAT TROPONIN, ED: TROPONIN I, POC: 0.14 ng/mL — AB (ref 0.00–0.08)

## 2017-02-19 LAB — BRAIN NATRIURETIC PEPTIDE

## 2017-02-19 LAB — PHOSPHORUS: PHOSPHORUS: 4.9 mg/dL — AB (ref 2.5–4.6)

## 2017-02-19 LAB — CBG MONITORING, ED: Glucose-Capillary: 68 mg/dL (ref 65–99)

## 2017-02-19 LAB — I-STAT CG4 LACTIC ACID, ED
Lactic Acid, Venous: 0.61 mmol/L (ref 0.5–1.9)
Lactic Acid, Venous: 0.93 mmol/L (ref 0.5–1.9)

## 2017-02-19 LAB — TRIGLYCERIDES: Triglycerides: 82 mg/dL (ref <150)

## 2017-02-19 LAB — LACTIC ACID, PLASMA: LACTIC ACID, VENOUS: 2.9 mmol/L — AB (ref 0.5–1.9)

## 2017-02-19 MED ORDER — DOCUSATE SODIUM 50 MG/5ML PO LIQD: 100.0000 mg | Freq: Two times a day (BID) | ORAL | Status: DC | PRN

## 2017-02-19 MED ORDER — SODIUM CHLORIDE 0.9 % IV SOLN: 100.0000 mL | INTRAVENOUS | Status: DC | PRN

## 2017-02-19 MED ORDER — PROPOFOL 1000 MG/100ML IV EMUL
0.0000 ug/kg/min | INTRAVENOUS | Status: DC
  Administered 2017-02-19: 10 ug/kg/min via INTRAVENOUS

## 2017-02-19 MED ORDER — LEVOFLOXACIN IN D5W 500 MG/100ML IV SOLN: 500.0000 mg | INTRAVENOUS | Status: DC

## 2017-02-19 MED ORDER — FENTANYL 2500MCG IN NS 250ML (10MCG/ML) PREMIX INFUSION
0.0000 ug/h | INTRAVENOUS | Status: DC
  Administered 2017-02-19: 100 ug/h via INTRAVENOUS
  Filled 2017-02-19: qty 250

## 2017-02-19 MED ORDER — LIDOCAINE HCL (PF) 1 % IJ SOLN
INTRAMUSCULAR | Status: AC
  Filled 2017-02-19: qty 30

## 2017-02-19 MED ORDER — MIDAZOLAM HCL 2 MG/2ML IJ SOLN
4.0000 mg | Freq: Once | INTRAMUSCULAR | Status: AC
  Administered 2017-02-19: 4 mg via INTRAVENOUS

## 2017-02-19 MED ORDER — SODIUM CHLORIDE 0.9 % IV BOLUS (SEPSIS)
1000.0000 mL | Freq: Once | INTRAVENOUS | Status: DC
  Administered 2017-02-19: 1000 mL via INTRAVENOUS

## 2017-02-19 MED ORDER — ALBUTEROL SULFATE (2.5 MG/3ML) 0.083% IN NEBU
10.0000 mg | INHALATION_SOLUTION | Freq: Once | RESPIRATORY_TRACT | Status: AC
  Administered 2017-02-19: 10 mg via RESPIRATORY_TRACT
  Filled 2017-02-19: qty 12

## 2017-02-19 MED ORDER — VANCOMYCIN HCL 500 MG IV SOLR
500.0000 mg | Freq: Once | INTRAVENOUS | Status: AC
  Administered 2017-02-19: 500 mg via INTRAVENOUS
  Filled 2017-02-19: qty 500

## 2017-02-19 MED ORDER — SODIUM CHLORIDE 0.9 % IV SOLN
Freq: Once | INTRAVENOUS | Status: AC
  Administered 2017-02-19: 03:00:00 via INTRAVENOUS

## 2017-02-19 MED ORDER — PANTOPRAZOLE SODIUM 40 MG IV SOLR: 40.0000 mg | Freq: Every day | INTRAVENOUS | Status: DC

## 2017-02-19 MED ORDER — CHLORHEXIDINE GLUCONATE 0.12% ORAL RINSE (MEDLINE KIT)
15.0000 mL | Freq: Two times a day (BID) | OROMUCOSAL | Status: DC
  Administered 2017-02-19: 15 mL via OROMUCOSAL

## 2017-02-19 MED ORDER — DEXTROSE 5 % IV SOLN
500.0000 mg | Freq: Two times a day (BID) | INTRAVENOUS | Status: DC
  Administered 2017-02-19: 500 mg via INTRAVENOUS
  Filled 2017-02-19 (×2): qty 0.5

## 2017-02-19 MED ORDER — VANCOMYCIN HCL IN DEXTROSE 1-5 GM/200ML-% IV SOLN
1000.0000 mg | Freq: Once | INTRAVENOUS | Status: DC
  Filled 2017-02-19: qty 200

## 2017-02-19 MED ORDER — VANCOMYCIN HCL 10 G IV SOLR
1500.0000 mg | Freq: Once | INTRAVENOUS | Status: AC
  Administered 2017-02-19: 1500 mg via INTRAVENOUS
  Filled 2017-02-19: qty 1500

## 2017-02-19 MED ORDER — SODIUM CHLORIDE 0.9 % IV SOLN
2.0000 g | Freq: Once | INTRAVENOUS | Status: AC
  Administered 2017-02-19: 2 g via INTRAVENOUS
  Filled 2017-02-19 (×2): qty 20

## 2017-02-19 MED ORDER — ETOMIDATE 2 MG/ML IV SOLN
30.0000 mg | Freq: Once | INTRAVENOUS | Status: AC
  Administered 2017-02-19: 30 mg via INTRAVENOUS

## 2017-02-19 MED ORDER — DEXTROSE 5 % IV SOLN
2.0000 g | Freq: Once | INTRAVENOUS | Status: DC
  Filled 2017-02-19: qty 2

## 2017-02-19 MED ORDER — DEXTROSE 50 % IV SOLN
INTRAVENOUS | Status: AC
  Administered 2017-02-19: 50 mL
  Filled 2017-02-19: qty 50

## 2017-02-19 MED ORDER — SODIUM CHLORIDE 0.9 % IV BOLUS (SEPSIS): 250.0000 mL | Freq: Once | INTRAVENOUS | Status: DC

## 2017-02-19 MED ORDER — AMIODARONE HCL 200 MG PO TABS
200.0000 mg | ORAL_TABLET | Freq: Every day | ORAL | Status: DC
  Administered 2017-02-19: 200 mg
  Filled 2017-02-19: qty 1

## 2017-02-19 MED ORDER — HEPARIN SODIUM (PORCINE) 5000 UNIT/ML IJ SOLN: 5000.0000 [IU] | Freq: Three times a day (TID) | INTRAMUSCULAR | Status: DC

## 2017-02-19 MED ORDER — DEXTROSE 50 % IV SOLN
50.0000 mL | Freq: Once | INTRAVENOUS | Status: AC
  Administered 2017-02-19: 50 mL via INTRAVENOUS
  Filled 2017-02-19: qty 50

## 2017-02-19 MED ORDER — SODIUM POLYSTYRENE SULFONATE 15 GM/60ML PO SUSP
30.0000 g | Freq: Once | ORAL | Status: AC
  Administered 2017-02-19: 30 g
  Filled 2017-02-19: qty 120

## 2017-02-19 MED ORDER — INSULIN ASPART 100 UNIT/ML IV SOLN
5.0000 [IU] | Freq: Once | INTRAVENOUS | Status: AC
  Administered 2017-02-19: 5 [IU] via INTRAVENOUS

## 2017-02-19 MED ORDER — INSULIN ASPART 100 UNIT/ML ~~LOC~~ SOLN
SUBCUTANEOUS | Status: AC
  Filled 2017-02-19: qty 1

## 2017-02-19 MED ORDER — SODIUM BICARBONATE 8.4 % IV SOLN
100.0000 meq | Freq: Once | INTRAVENOUS | Status: AC
  Administered 2017-02-19: 100 meq via INTRAVENOUS
  Filled 2017-02-19: qty 50

## 2017-02-19 MED ORDER — FENTANYL CITRATE (PF) 100 MCG/2ML IJ SOLN
50.0000 ug | INTRAMUSCULAR | Status: DC | PRN
  Administered 2017-02-19: 50 ug via INTRAVENOUS
  Filled 2017-02-19: qty 2

## 2017-02-19 MED ORDER — MIDAZOLAM HCL 2 MG/2ML IJ SOLN
INTRAMUSCULAR | Status: AC
  Filled 2017-02-19: qty 4

## 2017-02-19 MED ORDER — LIDOCAINE HCL (PF) 1 % IJ SOLN: 5.0000 mL | INTRAMUSCULAR | Status: DC | PRN

## 2017-02-19 MED ORDER — FENTANYL BOLUS VIA INFUSION
50.0000 ug | INTRAVENOUS | Status: DC | PRN
  Filled 2017-02-19: qty 200

## 2017-02-19 MED ORDER — HEPARIN SODIUM (PORCINE) 1000 UNIT/ML DIALYSIS: 1000.0000 [IU] | INTRAMUSCULAR | Status: DC | PRN

## 2017-02-19 MED ORDER — ONDANSETRON HCL 4 MG/2ML IJ SOLN
4.0000 mg | Freq: Once | INTRAMUSCULAR | Status: AC
  Administered 2017-02-19: 4 mg via INTRAVENOUS
  Filled 2017-02-19: qty 2

## 2017-02-19 MED ORDER — ALTEPLASE 2 MG IJ SOLR: 2.0000 mg | Freq: Once | INTRAMUSCULAR | Status: DC | PRN

## 2017-02-19 MED ORDER — LIDOCAINE-PRILOCAINE 2.5-2.5 % EX CREA: 1.0000 "application " | TOPICAL_CREAM | CUTANEOUS | Status: DC | PRN

## 2017-02-19 MED ORDER — BISACODYL 10 MG RE SUPP: 10.0000 mg | Freq: Every day | RECTAL | Status: DC | PRN

## 2017-02-19 MED ORDER — PENTAFLUOROPROP-TETRAFLUOROETH EX AERO: 1.0000 "application " | INHALATION_SPRAY | CUTANEOUS | Status: DC | PRN

## 2017-02-19 MED ORDER — INSULIN ASPART 100 UNIT/ML ~~LOC~~ SOLN
10.0000 [IU] | Freq: Once | SUBCUTANEOUS | Status: AC
  Administered 2017-02-19: 10 [IU] via INTRAVENOUS

## 2017-02-19 MED ORDER — LEVOFLOXACIN IN D5W 750 MG/150ML IV SOLN
750.0000 mg | Freq: Once | INTRAVENOUS | Status: AC
  Administered 2017-02-19: 750 mg via INTRAVENOUS
  Filled 2017-02-19: qty 150

## 2017-02-19 MED ORDER — SODIUM CHLORIDE 0.9 % IV BOLUS (SEPSIS)
1000.0000 mL | Freq: Once | INTRAVENOUS | Status: AC
  Administered 2017-02-19: 1000 mL via INTRAVENOUS

## 2017-02-19 MED ORDER — DEXTROSE 10 % IV SOLN
Freq: Once | INTRAVENOUS | Status: AC
  Administered 2017-02-19: 02:00:00 via INTRAVENOUS

## 2017-02-19 MED ORDER — SODIUM CHLORIDE 0.9 % IV SOLN
1.0000 g | Freq: Once | INTRAVENOUS | Status: AC
  Administered 2017-02-19: 1 g via INTRAVENOUS
  Filled 2017-02-19: qty 10

## 2017-02-19 MED ORDER — ORAL CARE MOUTH RINSE
15.0000 mL | OROMUCOSAL | Status: DC
  Administered 2017-02-19: 15 mL via OROMUCOSAL

## 2017-02-19 MED ORDER — CEFEPIME HCL 1 G IJ SOLR
1.0000 g | Freq: Once | INTRAMUSCULAR | Status: DC
  Filled 2017-02-19: qty 1

## 2017-02-19 MED ORDER — ACETAMINOPHEN 650 MG RE SUPP
650.0000 mg | Freq: Once | RECTAL | Status: AC
  Administered 2017-02-19: 650 mg via RECTAL
  Filled 2017-02-19: qty 1

## 2017-02-19 MED ORDER — HEPARIN SODIUM (PORCINE) 1000 UNIT/ML DIALYSIS
2000.0000 [IU] | Freq: Once | INTRAMUSCULAR | Status: AC
  Administered 2017-02-19: 2000 [IU] via INTRAVENOUS_CENTRAL

## 2017-02-19 MED ORDER — CEFEPIME HCL 1 G IJ SOLR: 1.0000 g | INTRAMUSCULAR | Status: DC

## 2017-02-20 ENCOUNTER — Ambulatory Visit: Payer: Medicare Other | Admitting: Internal Medicine

## 2017-02-20 LAB — CULTURE, BLOOD (ROUTINE X 2): SPECIAL REQUESTS: ADEQUATE

## 2017-02-21 NOTE — ED Provider Notes (Signed)
[facility] EMERGENCY DEPARTMENT Provider Note   CSN: [id number] Arrival date & time: 02/02/2017  2348     History   Chief Complaint Chief Complaint  Patient presents with  . Altered Mental Status    HPI JAVARION DOUTY is a 71 y.o. male with a hx of uremia, volume overload, long-term use of anticoagulants, BPH, right AKA, diabetes, hyperlipidemia, ESRD on dialysis (last dialysis was January 19, 2017), paroxysmal A. fib other, GERD, hypertension presents to the Emergency Department complaining of gradual, persistent, progressively worsening altered mental status onset earlier today per wife.  She reports that he did not get out of bed yesterday but was alert and oriented.  She reports this morning he was complaining of phantom limb pain and was given gabapentin.  She reports that he did not awake after this.  Wife denies falls or trauma.  Level 5 caveat for altered mental status.  The history is provided by medical records and a significant other. No language interpreter was used.    Past Medical History:  Diagnosis Date  . Arthritis    "fingers" (02/07/2015)  . Atrial flutter (HCC) 07/26/2014   a. May 2016, CHA2DS2VASc = 4 -> Eliquis, spontaneous conversion to NSR;  b. On amio;  c. 01/2015 EPS: Unable to induce right sided Aflutter. Non-sustained Afib and Left sided Aflutter noted.  . Benign prostatic hyperplasia with elevated prostate specific antigen (PSA) 10/31/2016   PSA (08/2008): 8.38, PSA (05/2009): 6.63, PSA (11/2009): 13.50, PSA (12/2009): 13.70, PSA (12/2010) 11.99, PSA (06/2011): 11.88, PSA (12/2011): 13.93, PSA (12/2014): 13.49 TRUS/BX (10/2008) Prostate 54 cc, Pathology BPH with single core of chronic inflammation  . Benign prostatic hypertrophy with nocturia 07/07/2006  . Cardiomyopathy (HCC)    a. 07/2014 Echo: EF 50%; b. 03/2015 Ech: EF 20-25%; c. 07/2015 Echo: EF 30-35%, Gr1 DD, sev LVH.  [name] Chronic venous insufficiency 04/12/2010  . Closed displaced  fracture of left femoral neck (HCC) 06/14/2014   s/p left hip hemiarthroplasty June 14, 2014   . Constipation 05/25/2009   Intermittent   . Coronary artery disease 04/02/2006   a. s/p RCA DES (2.5 x 24 mm TAXUS drug-eluting stent) 2006;  b. low risk myoview in 2011; c. 5/17 MV: EF 28%, large inf apical and inflat infarct w/o ischemia.  . Degenerative joint disease involving multiple joints 02/02/2007  . End stage renal disease on dialysis [facility])    a. TTS Dialysis - currently through right chest diatek, pending AVF.  [name] GERD (gastroesophageal reflux disease)   . Hepatitis    Hep C  . History of blood transfusion 05/2014   "related to hip OR"  . 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 Hyperlipidemia 04/02/2006  . Hypertension   . Hypertensive heart disease 12/09/2006  . Hypothyroidism   . Internal and external hemorrhoids without complication 08/20/2012  . Microcytic normochromic anemia 05/27/2006  . Obesity (BMI 30.0-34.9) 01/15/2012  . Phantom limb pain (HCC) 06/13/2016  . 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 Type 2 diabetes mellitus with peripheral artery disease (HCC) 05/27/2006   Absent pulses in the left  foot   . Type 2 diabetes mellitus with stage 5 chronic kidney disease (HCC) 08/06/2012    Patient Active Problem List   Diagnosis Date Noted  . Uremia 01/19/2017  . Volume overload 01/15/2017  . Benign prostatic hyperplasia with elevated prostate specific antigen (PSA) 10/31/2016  . Long term (current) use of anticoagulants [Z79.01] 07/28/2016  . Phantom limb pain  (HCC) 06/13/2016  . 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 Hyperlipidemia 08/10/2015  . End stage renal disease on dialysis (HCC)   . Hypothyroidism, acquired 12/12/2014  . Chronic hepatitis C without hepatic coma (HCC) 12/11/2014  . Aortic atherosclerosis (HCC) 11/30/2014  . Right bundle branch block (RBBB) 07/27/2014  . Paroxysmal atrial flutter (HCC) 07/26/2014  . Gastroesophageal reflux disease without esophagitis 07/01/2013  . Internal and external hemorrhoids without complication 08/20/2012  . Type 2 diabetes mellitus with end-stage renal disease (HCC) 08/06/2012  . Overweight (BMI 25.0-29.9) 01/15/2012  . Healthcare maintenance 12/12/2010  . Chronic venous insufficiency 04/12/2010  . Seasonal allergic rhinitis 05/25/2009  . Degenerative joint disease involving multiple joints 02/02/2007  . Essential hypertension 12/09/2006  . 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 Anemia of chronic renal failure, stage 5 (HCC) 05/27/2006  . Type 2 diabetes mellitus with peripheral artery disease (HCC) 05/27/2006  . Coronary artery disease of native artery of native heart with stable angina pectoris (HCC) 04/02/2006    Past Surgical History:  Procedure Laterality Date  . A/V FISTULAGRAM N/A 11/05/2016   Procedure: A/V Fistulagram Right Arm;  Surgeon: Maeola Harman, MD;  Location: [facility] INVASIVE CV LAB;  Service: Cardiovascular;  Laterality: N/A;  . AV FISTULA PLACEMENT Left 10/08/2015   Procedure:  BRACHIOCEPHALIC ARTERIOVENOUS (AV) FISTULA CREATION Right Arm;  Surgeon: Fransisco Hertz, MD;  Location: [facility] OR;  Service: Vascular;  Laterality: Left;  . CARDIAC CATHETERIZATION N/A 09/21/2015   Procedure: Left Heart Cath and Coronary Angiography;   Surgeon: Marykay Lex, MD;  Location: [facility] INVASIVE CV LAB;  Service: Cardiovascular;  Laterality: N/A;  . CHOLECYSTECTOMY N/A 12/12/2014   Procedure: LAPAROSCOPIC CHOLECYSTECTOMY WITH INTRAOPERATIVE CHOLANGIOGRAM;  Surgeon: Gaynelle Adu, MD;  Location: [facility] OR;  Service: General;  Laterality: N/A;  . CORONARY ANGIOPLASTY WITH STENT PLACEMENT    . ELECTROPHYSIOLOGIC STUDY N/A 02/07/2015   Procedure: A-Flutter Ablation;  Surgeon: Marinus Maw, MD;  Location: [facility] INVASIVE CV LAB;  Service: Cardiovascular;  Laterality: N/A;  . FRACTURE SURGERY    . INSERTION OF DIALYSIS CATHETER Right 06/13/2015   Procedure: INSERTION OF DIALYSIS CATHETER RIGHT INTERNAL JUGULAR;  Surgeon: Larina Earthly, MD;  Location: [facility] OR;  Service: Vascular;  Laterality: Right;  . JOINT REPLACEMENT    . LEG AMPUTATION ABOVE KNEE Right ~ 2008  . PERIPHERAL VASCULAR INTERVENTION Right 11/05/2016   Procedure: PERIPHERAL VASCULAR INTERVENTION;  Surgeon: Maeola Harman, MD;  Location: [facility] INVASIVE CV LAB;  Service: Cardiovascular;  Laterality: Right;  INNOMINANTE VEIN  . PILONIDAL CYST EXCISION  1990's  . PROSTATE BIOPSY  ~ 2013  . REFRACTIVE SURGERY Bilateral   . REMOVAL OF A DIALYSIS CATHETER Right 06/13/2015   Procedure: REMOVAL OF A DIALYSIS CATHETER;  Surgeon: Larina Earthly, MD;  Location: [facility] OR;  Service: Vascular;  Laterality: Right;  . SVC VENOGRAPHY N/A 11/05/2016   Procedure: SVC  Venography;  Surgeon: Maeola Harmanain, Brandon Christopher, MD;  [facility];  Service: Cardiovascular;  Laterality: N/A;  . TONSILLECTOMY    . TOTAL HIP ARTHROPLASTY Left 06/14/2014   Procedure: HEMI HIP ARTHROPLASTY ANTERIOR APPROACH;  Surgeon: Tarry KosNaiping M Xu, MD;  Location: [facility] OR;  Service: Orthopedics;  Laterality: Left;       Home Medications    Prior to Admission medications   Medication Sig Start Date End Date Taking? Authorizing Provider  acetaminophen (TYLENOL) 500 MG tablet Take 500-1,000 mg by mouth 2 (two) times daily as  needed for moderate pain.   Yes [provider]  amiodarone (PACERONE) 200 MG tablet Take 1 tablet (200 mg total) daily by mouth. 01/26/17  Yes Doneen PoissonKlima, Lawrence, MD  atorvastatin (LIPITOR) 40 MG tablet Take 1 tablet (40 mg total) by mouth every evening. 12/12/16 03/12/17 Yes Doneen PoissonKlima, Lawrence, MD  calcium citrate-vitamin D (CITRACAL+D) 315-200 MG-UNIT per tablet Take 1 tablet by mouth daily.    Yes [provider]  carvedilol (COREG) 6.25 MG tablet Take 1 tablet (6.25 mg total) by mouth 2 (two) times daily. Patient taking differently: Take 6.25 mg by mouth See admin instructions. 6.25 mg TWO times a day on Sun/Mon/Wed/Fri and 6.25 ONCE a day in the evening on Tues/Thurs/Sat 06/30/16  Yes Valentino NoseBoswell, Nathan, MD  famotidine (PEPCID) 20 MG tablet TAKE 1 TABLET BY MOUTH BEFORE BREAKFAST AND 1 TABLET BEFORE SUPPER Patient taking differently: Take 20 mg by mouth two times a day before meals (BREAKFAST and SUPPER) 12/31/16  Yes Crenshaw, Madolyn FriezeBrian S, MD  fish oil-omega-3 fatty acids 1000 MG capsule Take 1,000 mg by mouth daily.  01/15/12  Yes Doneen PoissonKlima, Lawrence, MD  fluticasone (FLONASE) 50 MCG/ACT nasal spray Place 1 spray into both nostrils daily as needed for allergies. 12/12/16  Yes Doneen PoissonKlima, Lawrence, MD  gabapentin (NEURONTIN) 300 MG capsule Take 1 capsule (300 mg total) by mouth daily as needed (for stump pain). 06/13/16  Yes Doneen PoissonKlima, Lawrence, MD  hydroxypropyl methylcellulose (ISOPTO TEARS) 2.5 % ophthalmic solution Place 2 drops into both eyes 3 (three) times daily as needed for dry eyes. Reported on 04/20/2015   Yes [provider]  levothyroxine (SYNTHROID, LEVOTHROID) 25 MCG tablet Take 1 tablet (25 mcg total) by mouth daily before breakfast. 09/17/16  Yes Doneen PoissonKlima, Lawrence, MD  Multiple Vitamins-Minerals (MULTIVITAMIN WITH MINERALS) tablet Take 1 tablet by mouth daily.   Yes [provider]  ondansetron (ZOFRAN) 4 MG tablet Take 1 tablet (4 mg total) by mouth every 8 (eight) hours as  needed for nausea. 03/03/16  Yes Doneen PoissonKlima, Lawrence, MD  warfarin (COUMADIN) 2.5 MG tablet Take 2.5-3.75 mg by mouth See admin instructions. Take 1 and 1/2 tablets on Sunday, Tuesday and Thursday then take 1 tablet all the other days 12/31/16  Yes [provider]    Family History Family History  Problem Relation Age of Onset  . Hypertension Mother   . Heart attack Father   . Breast cancer Sister   . Arthritis Sister   . Heart attack Brother   . Diabetes Brother   . Stroke Brother   . Pneumonia Daughter   . Diabetes Brother   . Alcoholism Brother   . Diabetes Brother   . Arthritis Sister        Bilateral knee replacement  . HIV Daughter   . Hypertension Daughter   . Drug abuse Daughter   . Schizophrenia Daughter   . Hypertension Daughter   . Drug abuse Daughter   .  Bipolar disorder Daughter     Social History Social History   Tobacco Use  . Smoking status: Former Smoker    Packs/day: 1.00    Years: 10.00    Pack years: 10.00    Types: Cigarettes    Last attempt to quit: 06/16/1968    Years since quitting: 48.7  . Smokeless tobacco: Never Used  Substance Use Topics  . Alcohol use: No    Alcohol/week: 0.0 oz    Comment: "quit alcohol in the 1960's"  . Drug use: No     Allergies   Lisinopril; Amoxicillin; Pravastatin; Tamsulosin; Doxycycline; and Tape   Review of Systems Review of Systems  Unable to perform ROS: Mental status change     Physical Exam Updated Vital Signs BP 135/64 (BP Location: Left Arm)   Pulse (!) 53   Temp (!) 101.1 F (38.4 C) (Rectal)   Resp 20   SpO2 100%   Physical Exam  Constitutional: He appears well-developed and well-nourished. He appears lethargic. No distress.  HENT:  Head: Normocephalic.  Mouth/Throat: Mucous membranes are dry ( Extremely dry mucous membranes).  Eyes: Conjunctivae are normal. No scleral icterus.  We will constricted and equal, minimally reactive to light  Neck: Normal range of motion. No JVD  present.  Cardiovascular: Intact distal pulses. Bradycardia present.  Pulses:      Radial pulses are 2+ on the right side, and 2+ on the left side.       Posterior tibial pulses are 2+ on the left side.  Pulmonary/Chest: Effort normal. No tachypnea. He has rhonchi ( throughout).  Abdominal: Soft. Bowel sounds are normal. He exhibits no distension. There is no tenderness. Hernia confirmed negative in the right inguinal area and confirmed negative in the left inguinal area.  Genitourinary: Testes normal and penis normal.  Genitourinary Comments: No evidence of Fournier's gangrene  Musculoskeletal: Normal range of motion.  Right AKA No open wounds to the bilateral lower extremities Palpable thrill in the right upper extremity dialysis fistula  Lymphadenopathy: No inguinal adenopathy noted on the right or left side.  Neurological: He appears lethargic. GCS eye subscore is 3. GCS verbal subscore is 3. GCS motor subscore is 6.  Patient does follow commands Appears generally weak with 4/5 grip strength bilaterally.  Is able to raise both arms however it does take longer to raise the right arm.  Skin: Skin is warm and dry. No petechiae, no purpura and no rash noted.  No rash, patient hot to touch Right thoracic pressure ulcer noted without surrounding signs of cellulitis Small area of abrasion to the right hip without induration  Nursing note and vitals reviewed.    ED Treatments / Results  Labs (all labs ordered are listed, but only abnormal results are displayed) Labs Reviewed  COMPREHENSIVE METABOLIC PANEL - Abnormal; Notable for the following components:      Result Value   Potassium 6.6 (*)    Chloride 123 (*)    CO2 11 (*)    Glucose, Bld 64 (*)    BUN 152 (*)    Creatinine, Ser 12.16 (*)    Calcium 7.9 (*)    Albumin 2.9 (*)    ALT 11 (*)    GFR calc non Af Amer 4 (*)    GFR calc Af Amer 4 (*)    All other components within normal limits  CBC WITH DIFFERENTIAL/PLATELET -  Abnormal; Notable for the following components:   RBC 4.00 (*)    Hemoglobin 9.8 (*)  HCT 31.1 (*)    MCV 77.8 (*)    MCH 24.5 (*)    RDW 17.0 (*)    Platelets 83 (*)    All other components within normal limits  PROTIME-INR - Abnormal; Notable for the following components:   Prothrombin Time 27.7 (*)    All other components within normal limits  BRAIN NATRIURETIC PEPTIDE - Abnormal; Notable for the following components:   B Natriuretic Peptide >4,500.0 (*)    All other components within normal limits  I-STAT CHEM 8, ED - Abnormal; Notable for the following components:   Sodium 149 (*)    Potassium 6.6 (*)    Chloride 125 (*)    BUN 124 (*)    Creatinine, Ser 12.50 (*)    Calcium, Ion 1.03 (*)    TCO2 12 (*)    Hemoglobin 11.2 (*)    HCT 33.0 (*)    All other components within normal limits  I-STAT TROPONIN, ED - Abnormal; Notable for the following components:   Troponin i, poc 0.14 (*)    All other components within normal limits  CULTURE, BLOOD (ROUTINE X 2)  CULTURE, BLOOD (ROUTINE X 2)  URINALYSIS, ROUTINE W REFLEX MICROSCOPIC  AMMONIA  I-STAT CG4 LACTIC ACID, ED  CBG MONITORING, ED  I-STAT CG4 LACTIC ACID, ED    EKG  EKG Interpretation  Date/Time:  Thursday February 19 2017 00:35:53 EST Ventricular Rate:  53 PR Interval:    QRS Duration: 173 QT Interval:  492 QTC Calculation: 462 R Axis:   -50 Text Interpretation:  Sinus rhythm Borderline prolonged PR interval RBBB and LAFB Abnrm T, consider ischemia, anterolateral lds Abnormal ekg Confirmed by Zadie Rhine ([id number]) on 02/17/2017 1:04:39 AM       Radiology Ct Head Wo Contrast  Result Date: 02/18/2017 CLINICAL DATA:  Altered level of consciousness EXAM: CT HEAD WITHOUT CONTRAST TECHNIQUE: Contiguous axial images were obtained from the base of the skull through the vertex without intravenous contrast. COMPARISON:  MRI 03/11/2006 FINDINGS: Brain: No acute territorial infarction, hemorrhage or  intracranial mass. Moderate atrophy. Moderate small vessel ischemic changes of the white matter. Old lacunar infarcts in the thalamus and bilateral basal ganglia. Prominent ventricles felt secondary to atrophy. Vascular: No hyperdense vessels. Vertebral artery and carotid artery calcification Skull: Normal. Negative for fracture or focal lesion. Sinuses/Orbits: Mucosal thickening in the sphenoid and ethmoid sinuses. Mucous retention cyst in the right maxillary sinus. Old right medial wall orbital fracture. Other: None IMPRESSION: No CT evidence for acute intracranial abnormality. Atrophy and small vessel ischemic changes of the white matter Electronically Signed   By: Jasmine Pang M.D.   On: 01/24/2017 01:02   Dg Chest Port 1 View  Result Date: 02/03/2017 CLINICAL DATA:  Sepsis. EXAM: PORTABLE CHEST 1 VIEW COMPARISON:  Radiographs 01/19/2017 FINDINGS: Cardiomegaly is similar to prior. Vascular stents in the region of the right and on the mid/subclavian. Layering right pleural effusion with hazy opacity in the right lung. Improved left pleural effusion from prior exam. Vascular congestion without overt edema. No pneumothorax. IMPRESSION: Bilateral pleural effusions, layering on the right. Left pleural effusion has improved from prior exam. Cardiomegaly is stable. Electronically Signed   By: Rubye Oaks M.D.   On: 02/14/2017 00:41    Procedures Procedures (including critical care time)  CRITICAL CARE Performed by: Dahlia Client Johanna Stafford Total critical care time: 60 minutes Critical care time was exclusive of separately billable procedures and treating other patients. Critical care was necessary to treat or prevent  imminent or life-threatening deterioration. Critical care was time spent personally by me on the following activities: development of treatment plan with patient and/or surrogate as well as nursing, discussions with consultants, evaluation of patient's response to treatment, examination of  patient, obtaining history from patient or surrogate, ordering and performing treatments and interventions, ordering and review of laboratory studies, ordering and review of radiographic studies, pulse oximetry and re-evaluation of patient's condition.   Medications Ordered in ED Medications  vancomycin (VANCOCIN) 1,500 mg in sodium chloride 0.9 % 500 mL IVPB (not administered)  aztreonam (AZACTAM) 500 mg in dextrose 5 % 50 mL IVPB (500 mg Intravenous New Bag/Given 02/07/2017 0216)  0.9 %  sodium chloride infusion (not administered)  levofloxacin (LEVAQUIN) IVPB 500 mg (not administered)  sodium chloride 0.9 % bolus 1,000 mL (0 mLs Intravenous Stopped 01/23/2017 0130)  levofloxacin (LEVAQUIN) IVPB 750 mg (0 mg Intravenous Stopped 02/01/2017 0203)  calcium chloride 1 g in sodium chloride 0.9 % 100 mL IVPB (0 g Intravenous Stopped 02/05/2017 0217)  dextrose 10 % infusion ( Intravenous New Bag/Given 02/15/2017 0212)  insulin aspart (novoLOG) injection 5 Units (5 Units Intravenous Given 02/15/2017 0200)  albuterol (PROVENTIL) (2.5 MG/3ML) 0.083% nebulizer solution 10 mg (10 mg Nebulization Given 02/14/2017 0126)  acetaminophen (TYLENOL) suppository 650 mg (650 mg Rectal Given 01/29/2017 0135)  dextrose 50 % solution (50 mLs  Given 02/01/2017 0157)     Initial Impression / Assessment and Plan / ED Course  I have reviewed the triage vital signs and the nursing notes.  Pertinent labs & imaging results that were available during my care of the patient were reviewed by me and considered in my medical decision making (see chart for details).  Clinical Course as of Feb 19 238  Thu Feb 19, 2017  0106 Discussed with Dr. Juel BurrowLin of nephrology who reports they will dialyze in the AM  [HM]    Clinical Course User Index [HM] Melissaann Dizdarevic, Dahlia ClientHannah, [place]PA-C    Patient presents with altered mental status, hyperkalemia and sepsis.  Unknown source of his sepsis.  No evidence of skin lesions or cellulitis.  Chest x-ray with  bilateral pleural effusions but no specific focal density.  No evidence of UTI.  Elevated troponin likely secondary to his lack of dialysis for the last 30 days.  Patient's creatinine is 12.5; BUN 152.  Discussed with nephrology who will dialyze in the morning.  Hyperkalemia temporized.  EKG with bradycardia and widened QRS from previous.  Patient is protecting his airway at this time.  Discussed with internal medicine teaching service who will admit.  The patient was discussed with and seen by Dr. Bebe ShaggyWickline who agrees with the treatment plan.     Final Clinical Impressions(s) / ED Diagnoses   Final diagnoses:  Altered mental status, unspecified altered mental status type  Hyperkalemia  ESRD (end stage renal disease) (HCC)  Sepsis, due to unspecified organism [facility](HCC)    ED Discharge Orders    None       Jakolby Sedivy, Boyd KerbsHannah, PA-C 02/13/2017 0239    Zadie RhineWickline, Donald, MD 02/07/2017 [phone]

## 2017-02-21 NOTE — H&P (Signed)
PULMONARY / CRITICAL CARE MEDICINE   Name: [name] [name] MRN: [id number] DOB: January 19, 1946    ADMISSION DATE:  02/03/2017 CONSULTATION DATE:  2017-03-11  REFERRING MD:  Dr. Bebe Shaggy  CHIEF COMPLAINT:  AMS  HISTORY OF PRESENT ILLNESS:  HPI obtained from medical chart review as patient is acutely encephalopathic.    71 year old male with extensive PMH significant for but not limited to ESRD on HD, uremia, DM, HLD, PAF/flutter, HTN, GERD, right AKA, and BPH.  He has not had dialysis for one month.  Presented to the ER on 11/28 with progressive confusion, lethargy and weakness starting after breakfast on 11/28.  Of note, he was admitted on 10/25 with chest pain and dyspnea after missing dialysis for 9 days but left AMA before receiving dialysis.  Admitted on 10/29-10/30 for same symptoms and uremia and underwent dialysis twice with improvement in mental status and discharged home with plans to continue outpatient dialysis.    In the ED, patient initially oriented with generalized weakness. Noted for fever 101.1 rectal.  Labs noted for K 6.6, Na 149, BUN 124, sCr 12.50, troponin 0.14, WBC 4.7, Hgb 9.8, Plts 83, normal lactate, INR 2.61.  EKG with wide complex sinus bradycardia initially then wide complex aflutter . CXR consistent with pulmonary edema.  He was treated for hyperkalemia and taken for emergent dialysis.  In dialysis, patent minimally responsive.  Noted to have episode of vomiting and aspiration.  He was thought not to be able to protect his airway and therefore intubated.  PCCM to admit.   PAST MEDICAL HISTORY :  He  has a past medical history of Arthritis, Atrial flutter (HCC) (07/26/2014), Benign prostatic hyperplasia with elevated prostate specific antigen (PSA) (10/31/2016), Benign prostatic hypertrophy with nocturia (07/07/2006), Cardiomyopathy (HCC), Chronic venous insufficiency (04/12/2010), Closed displaced fracture of left femoral neck (HCC) (06/14/2014), Constipation (05/25/2009),  Coronary artery disease (04/02/2006), Degenerative joint disease involving multiple joints (02/02/2007), End stage renal disease on dialysis [facility]), GERD (gastroesophageal reflux disease), Hepatitis, History of blood transfusion (05/2014), AKA (above knee amputation), right (HCC) (06/12/2016), Hyperlipidemia (04/02/2006), Hypertension, Hypertensive heart disease (12/09/2006), Hypothyroidism, Internal and external hemorrhoids without complication (08/20/2012), Microcytic normochromic anemia (05/27/2006), Obesity (BMI 30.0-34.9) (01/15/2012), Phantom limb pain (HCC) (06/13/2016), 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  PAST SURGICAL HISTORY: He  has a past surgical history that includes Pilonidal cyst excision (1990's); Refractive surgery (Bilateral); Fracture surgery; Total hip arthroplasty (Left, 06/14/2014); Joint replacement; Leg amputation above knee (Right, ~ 2008); Cholecystectomy (N/A, 12/12/2014); Cardiac catheterization (N/A, 02/07/2015); Tonsillectomy; Coronary angioplasty with stent; Prostate biopsy (~ 2013); Insertion of dialysis catheter (Right, 06/13/2015); Removal of a dialysis catheter (Right, 06/13/2015); Cardiac catheterization (N/A, 09/21/2015); AV fistula placement (Left, 10/08/2015); A/V Fistulagram (N/A, 11/05/2016); PERIPHERAL VASCULAR INTERVENTION (Right, 11/05/2016); and SVC VENOGRAPHY (N/A, 11/05/2016).  Allergies  Allergen Reactions  . Lisinopril Other (See Comments)    Acute kidney injury  . Amoxicillin Swelling and Other (See Comments)    Puffy eyes & abdominal  pain Has patient had a PCN reaction causing immediate rash, facial/tongue/throat swelling, SOB or lightheadedness with hypotension: Yes Has patient had a PCN reaction causing severe rash involving mucus membranes or skin  necrosis: Unknown Has patient had a PCN reaction that required hospitalization: Unknown Has patient had a PCN reaction occurring within the last 10 years: No    . Pravastatin Other (See Comments)    Weakness and fatigue  . Tamsulosin Other (See Comments)    dry throat, sweating, blurred vision  . Doxycycline Rash and Other (See Comments)    Questionable drug rxn rash  . Tape Rash    Medical tape     No current facility-administered medications on file prior to encounter.    Current Outpatient Medications on File Prior to Encounter  Medication Sig  . acetaminophen (TYLENOL) 500 MG tablet Take 500-1,000 mg by mouth 2 (two) times daily as needed for moderate pain.  [name]. amiodarone (PACERONE) 200 MG tablet Take 1 tablet (200 mg total) daily by mouth.  [name]. atorvastatin (LIPITOR) 40 MG tablet Take 1 tablet (40 mg total) by mouth every evening.  . calcium citrate-vitamin D (CITRACAL+D) 315-200 MG-UNIT per tablet Take 1 tablet by mouth daily.   . carvedilol (COREG) 6.25 MG tablet Take 1 tablet (6.25 mg total) by mouth 2 (two) times daily. (Patient taking differently: Take 6.25 mg by mouth See admin instructions. 6.25 mg TWO times a day on Sun/Mon/Wed/Fri and 6.25 ONCE a day in the evening on Tues/Thurs/Sat)  . famotidine (PEPCID) 20 MG tablet TAKE 1 TABLET BY MOUTH BEFORE BREAKFAST AND 1 TABLET BEFORE SUPPER (Patient taking differently: Take 20 mg by mouth two times a day before meals (BREAKFAST and SUPPER))  . fish oil-omega-3 fatty acids 1000 MG capsule Take 1,000 mg by mouth daily.   . fluticasone (FLONASE) 50 MCG/ACT nasal spray Place 1 spray into both nostrils daily as needed for allergies.  [name]. gabapentin (NEURONTIN) 300 MG capsule Take 1 capsule (300 mg total) by mouth daily as  needed (for stump pain).  . hydroxypropyl methylcellulose (ISOPTO TEARS) 2.5 % ophthalmic solution Place 2 drops into both eyes 3 (three) times daily as needed for dry eyes. Reported on 04/20/2015  . levothyroxine (SYNTHROID, LEVOTHROID) 25 MCG tablet Take 1 tablet (25 mcg total) by mouth daily before breakfast.  . Multiple Vitamins-Minerals (MULTIVITAMIN WITH MINERALS) tablet Take 1 tablet by mouth daily.  . ondansetron (ZOFRAN) 4 MG tablet Take 1 tablet (4 mg total) by mouth every 8 (eight) hours as needed for nausea.  [name]. warfarin (COUMADIN) 2.5 MG tablet Take 2.5-3.75 mg by mouth See admin instructions. Take 1 and 1/2 tablets on Sunday, Tuesday and Thursday then take 1 tablet all the other days    FAMILY HISTORY:  His indicated that his mother is deceased. He indicated that his father is deceased. He indicated that all of his three sisters are alive. He indicated that only one of his three brothers is alive. He indicated that four of his five daughters are alive. He indicated that his son is alive.   SOCIAL HISTORY: He  reports that he quit smoking about 48 years ago. His smoking use included cigarettes. He has a 10.00 pack-year smoking history. he has never used smokeless tobacco. He reports that he does not drink alcohol or use drugs.  REVIEW OF SYSTEMS:   Unable  SUBJECTIVE:   VITAL SIGNS: BP 134/74   Pulse (!) 111   Temp (!) 101.1 F (38.4 C) (Rectal)   Resp 16   Wt 175 lb (79.4 kg)   SpO2 98%   BMI 25.84 kg/m   HEMODYNAMICS:    VENTILATOR SETTINGS:    INTAKE / OUTPUT: No intake/output  data recorded.  PHYSICAL EXAMINATION: General:  Chronically ill appearing male  HEENT: MM pale/dry, +JVD Neuro: Lethargic, minimally responsive  CV:  Brady to tachy irir PULM: even/non-labored, bilateral rales GI: soft, bs active  Extremities: warm/dry,R AKA, RUE fistula - site well appearing  Skin: no rashes  LABS:  BMET Recent Labs  Lab 01/28/2017 0025 02/12/2017 0032  NA 144  149*  K 6.6* 6.6*  CL 123* 125*  CO2 11*  --   BUN 152* 124*  CREATININE 12.16* 12.50*  GLUCOSE 64* 65    Electrolytes Recent Labs  Lab 02/01/2017 0025  CALCIUM 7.9*    CBC Recent Labs  Lab 01/22/2017 0025 01/29/2017 0032  WBC 4.7  --   HGB 9.8* 11.2*  HCT 31.1* 33.0*  PLT 83*  --     Coag's Recent Labs  Lab 01/29/2017 0025  INR 2.61    Sepsis Markers Recent Labs  Lab 02/04/2017 0031 01/26/2017 0226  LATICACIDVEN 0.93 0.61    ABG No results for input(s): PHART, PCO2ART, PO2ART in the last 168 hours.  Liver Enzymes Recent Labs  Lab 01/29/2017 0025  AST 15  ALT 11*  ALKPHOS 55  BILITOT 1.0  ALBUMIN 2.9*    Cardiac Enzymes No results for input(s): TROPONINI, PROBNP in the last 168 hours.  Glucose Recent Labs  Lab 02/10/2017 0135  GLUCAP 68    Imaging Ct Head Wo Contrast  Result Date: 01/26/2017 CLINICAL DATA:  Altered level of consciousness EXAM: CT HEAD WITHOUT CONTRAST TECHNIQUE: Contiguous axial images were obtained from the base of the skull through the vertex without intravenous contrast. COMPARISON:  MRI 03/11/2006 FINDINGS: Brain: No acute territorial infarction, hemorrhage or intracranial mass. Moderate atrophy. Moderate small vessel ischemic changes of the white matter. Old lacunar infarcts in the thalamus and bilateral basal ganglia. Prominent ventricles felt secondary to atrophy. Vascular: No hyperdense vessels. Vertebral artery and carotid artery calcification Skull: Normal. Negative for fracture or focal lesion. Sinuses/Orbits: Mucosal thickening in the sphenoid and ethmoid sinuses. Mucous retention cyst in the right maxillary sinus. Old right medial wall orbital fracture. Other: None IMPRESSION: No CT evidence for acute intracranial abnormality. Atrophy and small vessel ischemic changes of the white matter Electronically Signed   By: Jasmine PangKim  Fujinaga M.D.   On: 01/30/2017 01:02   Dg Chest Port 1 View  Result Date: 02/07/2017 CLINICAL DATA:  Sepsis.  EXAM: PORTABLE CHEST 1 VIEW COMPARISON:  Radiographs 01/19/2017 FINDINGS: Cardiomegaly is similar to prior. Vascular stents in the region of the right and on the mid/subclavian. Layering right pleural effusion with hazy opacity in the right lung. Improved left pleural effusion from prior exam. Vascular congestion without overt edema. No pneumothorax. IMPRESSION: Bilateral pleural effusions, layering on the right. Left pleural effusion has improved from prior exam. Cardiomegaly is stable. Electronically Signed   By: Rubye OaksMelanie  Ehinger M.D.   On: 02/14/2017 00:41   STUDIES:  CXR 11/29 >> Bilateral pleural effusions, layering on the right. Left pleural effusion has improved from prior exam. Cardiomegaly is stable. Vascular stent in the region of the right brachiocephalic/subclavian.  CXR 11/29 >>1. Endotracheal tube 4.4 cm from the carina. 2. Unchanged cardiomegaly, vascular congestion and layering right pleural effusion.   CULTURES: 11/29 MRSA PCR >> 11/29 BC x 2 >> 11/29 trach asp >>  ANTIBIOTICS: 11/29 Vanc >> 11/29 Levaquin >>  SIGNIFICANT EVENTS: 11/29 Admit  LINES/TUBES: PIV x 2 ETT 11/29 >> OGT 11/29 >>  DISCUSSION: [age] yoM with extensive PMH w/ESRD noncompliant with HD x  1 month presents with fever, lethargy, and weakness found to be uremic and hyperkalemic.  Taken for emergent HD where patient vomited and could not protect his airway due to AMS and therefore intubated.    ASSESSMENT / PLAN:  PULMONARY A: Acute respiratory insufficiency in the setting of acute encephalopathy  Pulmonary edema with effusions R>L Probable aspiration P:   S/p intubation Full MV support, PRVC 8 cc/kg ABG and CXR  VAP protocol On HD for volume removal  CARDIOVASCULAR A:  Bradycardia- secondary to hyperkalemia Aflutter  Elevated troponin- likely demand Acute volume overload  Hx HTN, ICM, PAD, CAD - BNP > 4500 - normal lactate P:  Tele monitoring Goal MAP > 60 Electrolyte correction  with HD Trend troponins Coumadin as below Continue home amio Hold home coreg  RENAL A:   ESRD non compliant with HD Uremic  Hyperkalemic Hypocalcemic  P:   Per Renal Currently on HD Repeat BMP at 0900  GASTROINTESTINAL A:   NPO P:   OGT  PPI for SUP Bowel regimen per PAD protocol  HEMATOLOGIC A:   Anemia- chronic illness/ ESRD Thrombocytopenia  Uremic  On chronic anticoagulation  - INR 2.61 P:  Trend CBC and coags Aranesp per renal Coumadin per pharmacy protocol   INFECTIOUS A:   Fever without leukocytosis/penia, unclear source Possible aspiration P:   Empiric coverage with vanc and aztreonam with PCN allergy, narrow as able Pan- culture UA negative  Trend fever/wbc  ENDOCRINE A:   DM Hypoglycemia on arrival  P:   CBG q1 x 4 then q 4  NEUROLOGIC A:   Acute encephalopathy- likely multifactorial given uremia, possible sepsis w/fever ? Element of dementia - ammonia 23 P:   RASS goal: 0/-1 PAD protocol with propofol if needed and prn fentanyl DWA Neuro checks  Global: PMT consulted to help establish goals of care with patient given his multiple chronic medical conditions, leaving AMA, and noncompliance with medical care over the last 2 months despite patient remaining a full code.  Additionally, we many need a psych consult when appropriate to help determine if patient is capable of making his medical decisions, as this has been questioned before.    FAMILY  - Updates: No family at bedside.  - Inter-disciplinary family meet or Palliative Care meeting due by:  02/24/2017  CCT 60 mins  Posey Boyer, AGACNP-BC Pulmonary and Critical Care Medicine [facility] Pager: [phone]  02/25/2017, 4:41 AM

## 2017-02-21 NOTE — ED Provider Notes (Signed)
Patient awaiting admission He has had runs of tachycardia, which I feel actually represents atrial flutter After being seen by internal medicine service, he will be sent for dialysis Patient is awake alert, heart rates improved, blood pressure stable Patient is responsive Will be admitted   Zadie RhineWickline, Makilah Dowda, MD 03/08/2017 [phone]

## 2017-02-21 NOTE — ED Notes (Signed)
Hour-long neb tx in progress 

## 2017-02-21 NOTE — Progress Notes (Signed)
Pt arrived to HD unit w/o notice or report. I was supposed to run pt in the ICU and was awaiting room for pt. Pt arrived w/ a few MDs and the ED nurse. I told them I am to run pt in ICU, I can't run the pt w/ that acuity in the unit, MD told me they were told to bring the pt to the unit as there were no ICU beds available at this time. She called Dr. Juel BurrowLin to explain to me why this was to be done. I explained to them that I would not run the pt in the unit alone and I was told that Dr. Juel BurrowLin and/or a RR nurse would stay in the unit w/ me while he was being run. Took report at the bedside from ED nurse Loistine ChanceScott McCord.

## 2017-02-21 NOTE — Progress Notes (Signed)
MD notified of patients passing.

## 2017-02-21 NOTE — Significant Event (Signed)
Rapid Response Event Note Internal Medicine called me to hemodialysis due pt unstable Overview: Time Called: 0358 Arrival Time: 0402 Event Type: Other (Comment)(unstable)  Initial Focused Assessment: On arrival pt lying supine in bed, skin warm and dry, responding only to noxious stimuli, not following commands. Pt presented to ED with AMS after not attending dialysis for one month. Pt had a recent admit for the same. Pt vomited yellow bile x1. Pt went into vtach with a pulse while in ED. Dr. Juel BurrowLin with nephrology and internal medicine wanted to bring pt to HD for emergent dialysis. I was called to bedside for additional assistance as patient was becoming more lethargic. PCCM contacted. Pt intubated and dialysis started. Pt began bucking the ETT, propofol gtt started at 10 mcg at 0512 BP 108.38/, BP 106/44 propofol gtt decreased to 5 mcg at 0530. RN having difficulty obtaining BP on pt and propofol gtt stopped @ 0540. Pt became bradycardic in the mid 40's then back into 100's then dropped to 70-80's two hours into treatment. BP 66/24, treatment discontinued, 250 NS bolus started. Pt in PEA arrest, code blue called, PCCM contacted, one round of CPR and Epi x1 given, ROSC. Pt then transported to 2H25     Event Summary:   at      at          [facility]HULAR, Sandi CarneLESLIE Paige

## 2017-02-21 NOTE — Progress Notes (Signed)
Pharmacy Antibiotic Note  [name] [name] is a 71 y.o. male admitted on 02/08/2017 with sepsis.  Pharmacy has been consulted for vancomycin, aztreonam, and levofloxacin dosing. Noted allergy to amoxicillin causing swelling.   Tmax 101.1. WBC 4.7, and LA 0.93. Patient is ESRD on HD, however, patient's wife reports last HD was several weeks ago. 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   Levaquin 750mg  IV x1 given in ED.   Plan: Aztreonam 500 mg IV q12 Levaquin 500 mg IV q48hr  Vancomycin doses to be ordered based on HD plan  Monitor clinical picture and culture data F/u HD plan and length of therapy   Temp (24hrs), Avg:101.1 F (38.4 C), Min:101.1 F (38.4 C), Max:101.1 F (38.4 C)  No results for input(s): WBC, CREATININE, LATICACIDVEN, VANCOTROUGH, VANCOPEAK, VANCORANDOM, GENTTROUGH, GENTPEAK, GENTRANDOM, TOBRATROUGH, TOBRAPEAK, TOBRARND, AMIKACINPEAK, AMIKACINTROU, AMIKACIN in the last 168 hours.  CrCl cannot be calculated (Patient's most recent lab result is older than the maximum 21 days allowed.).    Allergies  Allergen Reactions  . Lisinopril Other (See Comments)    Acute kidney injury  . Amoxicillin Swelling and Other (See Comments)    Puffy eyes & abdominal pain Has patient had a PCN reaction causing immediate rash, facial/tongue/throat swelling, SOB or lightheadedness with hypotension: Yes Has patient had a PCN reaction causing severe rash involving mucus membranes or skin necrosis: Unknown Has patient had a PCN reaction that required hospitalization: Unknown Has patient had a PCN reaction occurring within the last 10 years: No    . Pravastatin Other (See Comments)    Weakness and fatigue  . Tamsulosin Other (See Comments)    dry throat, sweating, blurred vision  . Doxycycline Rash and Other (See Comments)    Questionable drug rxn rash  . Tape Rash    Medical tape     Antimicrobials this admission: 11/29 Vanc >>  11/29 Levaquin >>  11/29  Aztreonam >>   Microbiology results: pending   Einar CrowKatherine Max Romano, PharmD Clinical Pharmacist 07-04-16 1:15 AM

## 2017-02-21 NOTE — ED Notes (Signed)
Nurse starting IV,  Drawing 1st set of blood cultures and labs.  I will get 2nd set of blood cultures after nurse is done collecting labs.

## 2017-02-21 NOTE — Procedures (Signed)
Extubation Procedure Note  Patient Details:   Name: [name] [name] DOB: 07/15/1945 MRN: [id number]   Airway Documentation:     Evaluation  O2 sats: stable throughout Complications: No apparent complications Patient did tolerate procedure well. Bilateral Breath Sounds: Coarse crackles   No   Pt. Was terminally extubated to room air per MD order.   [name] [name], [name] [name] March 23, 2017, 2:48 PM

## 2017-02-21 NOTE — ED Notes (Signed)
Second set of blood cx obtained 0040 by Lesle ChrisKaren M Phleb

## 2017-02-21 NOTE — Code Documentation (Signed)
  Patient Name: [name] [name]   MRN: [id number]   Date of Birth/ Sex: 01/18/1946 , male      Admission Date: 02/20/2017  Attending Provider: Reyes IvanAljishi, Wael Z, MD  Primary Diagnosis: <principal problem not specified>   Indication: Pt was in his usual state of health until this AM, when his blood pressure dropped drastically. Unknown rhythm at that time per nursing staff. Code blue was subsequently called. At the time of arrival on scene, ACLS protocol was underway.   Technical Description:  - CPR performance duration:  1 cycle  - Was defibrillation or cardioversion used? No  - Was external pacer placed? No  - Was patient intubated pre/post CPR? Yes, Pre   Medications Administered: Y = Yes; Blank = No Amiodarone    Atropine    Calcium    Epinephrine  Y  Lidocaine    Magnesium    Norepinephrine    Phenylephrine    Sodium bicarbonate    Vasopressin     Post CPR evaluation:  - Final Status - Was patient successfully resuscitated ? Yes - What is current rhythm? Sinus rhythm  - What is current hemodynamic status? stable  Miscellaneous Information:  - Labs sent, including: N/A  - Primary team notified?  Yes. PCCM arrived at bedside  - Family Notified? PCCM in process of contacting family  - Additional notes/ transfer status: Patient is being transferred from Dialysis unit to Cardiac ICU     Toney RakesLacroce, Samantha J, MD  06-06-16, 6:28 AM

## 2017-02-21 NOTE — ED Notes (Signed)
Delay I 2nd set of blood cultures,  Xray is currently in room.

## 2017-02-21 NOTE — Progress Notes (Signed)
Inpatient Diabetes Program Recommendations  AACE/ADA: New Consensus Statement on Inpatient Glycemic Control (2015)  Target Ranges:  Prepandial:   less than 140 mg/dL      Peak postprandial:   less than 180 mg/dL (1-2 hours)      Critically ill patients:  140 - 180 mg/dL   Results for [name] [name], [name] [name] (MRN [id number]) as of 01/23/2017 10:49  Ref. Range 02/17/2017 01:35 01/29/2017 04:40 02/11/2017 08:30  Glucose-Capillary Latest Ref Range: 65 - 99 mg/dL 68 952110 (H) 841204 (H)  Results for [name] [name], [name] [name] (MRN [id number]) as of 01/29/2017 10:49  Ref. Range 12/12/2016 12:17  Hemoglobin A1C Unknown 7.3   Review of Glycemic Control  Diabetes history: DM2 Outpatient Diabetes medications: None Current orders for Inpatient glycemic control: None  Inpatient Diabetes Program Recommendations: Correction (SSI): Please consider ordering CBGs with Novolog correction scale Q4H.  Thanks, Orlando PennerMarie Sohrab Keelan, RN, MSN, CDE Diabetes Coordinator Inpatient Diabetes Program [phone] (Team Pager from 8am to 5pm)

## 2017-02-21 NOTE — Consult Note (Signed)
Consultation Note Date: 03/04/2017   Patient Name: [name] [name]  DOB: December 09, 1945  MRN: [id number]  Age / Sex: 71 y.o., male  PCP: Oval Linsey, MD Referring Physician: Rush Farmer, MD  Reason for Consultation: Establishing goals of care  HPI/Patient Profile: 71 y.o. male  with past medical history of ESRD on HD (hx of not going to dialysis appointments and leaving hospital AMA before getting treatments), DM, HTN PVD, s/p R AKA, BPH admitted on 02/17/2017 with AMS. Workup revealed severe hyperkalemia, uremia, possible sepsis. Per spouse patient had not been to dialysis in several weeks. He required emergent dialysis and coded during dialysis, requiring intubation. He also had an episode of vomiting at some point and possible apsirated gastric contents. He was made DNR status per attending team discussion with spouse. Palliative medicine consulted for further [place] discussion.     Clinical Assessment and Goals of Care:  I have reviewed medical records including EPIC notes, labs and imaging, received report from patient's nurses, assessed the patient and then met at the bedside along with patient's spouse, [name] [name] and her daughter to discuss diagnosis prognosis, GOC, EOL wishes, disposition and options.  I introduced Palliative Medicine as specialized medical care for people living with serious illness. It focuses on providing relief from the symptoms and stress of a serious illness. The goal is to improve quality of life for both the patient and the family.  We discussed a brief life review of the patient. He was previously married and has several children who are in his room, but [name] [name] does not want to include them in our discussion. She feels they have not been present when she has been caring for him during his previous illnesses and she worries they are only concerned about their personal benefits  of his poor situation. (Notably, I observed them being supportive of [name] [name] and her difficult situation). He used to derive joy from working in his yard and garden. He hasn't been able to work in his yard the way he loved in several years. He was retired from working as a Retail buyer for [employer]. The kids called him [name] [name]. He enjoyed his job.   The difference between aggressive medical intervention and comfort care was considered in light of the patient's goals of care. Per [name] [name] and her daughter [name] [name] had stated he did not want to be kept alive on a ventilator. He would not want aggressive medical care. He was tired of going to dialysis and had been discussing stopping with his children who lived in [place]. They had told him they would be ok if he decided to stop dialysis. This bothered [name] [name] as she was not ready for him to stop. However, now, in this situation, [name] [name] feels [name] [name] would want to be made comfortable and be allowed to experience a natural peaceful death without continued aggressive measures- no more HD, no more ventilator, only comfort care.   [name] [name] has a sister coming from [place] and a brother also coming. Once  they have arrived, they would like to proceed with compassionate extubation.    Primary Decision Maker NEXT OF KIN- spouse- [name] [name] -Transition to comfort care - One way extubation when family has arrived  Code Status/Advance Care Planning:  DNR  Psycho-social/Spiritual:   Desire for further Chaplaincy support:yes  Additional Recommendations: Compassionate Wean Education  Prognosis:    Hours - Days  Discharge Planning: Anticipated Hospital Death  Primary Diagnoses: Present on Admission: . Altered mental status . Acute encephalopathy   I have reviewed the medical record, interviewed the patient and family, and examined the patient. The following aspects are pertinent.  Past  Medical History:  Diagnosis Date  . Arthritis    "fingers" (02/07/2015)  . Atrial flutter ([place]) 07/26/2014   a. May 2016, CHA2DS2VASc = 4 -> Eliquis, spontaneous conversion to NSR;  b. On amio;  c. 01/2015 EPS: Unable to induce right sided Aflutter. Non-sustained Afib and Left sided Aflutter noted.  . Benign prostatic hyperplasia with elevated prostate specific antigen (PSA) 10/31/2016   PSA (08/2008): 8.38, PSA (05/2009): 6.63, PSA (11/2009): 13.50, PSA (12/2009): 13.70, PSA (12/2010) 11.99, PSA (06/2011): 11.88, PSA (12/2011): 13.93, PSA (12/2014): 13.49 TRUS/BX (10/2008) Prostate 54 cc, Pathology BPH with single core of chronic inflammation  . Benign prostatic hypertrophy with nocturia 07/07/2006  . Cardiomyopathy ([place])    a. 07/2014 Echo: EF 50%; b. 03/2015 Ech: EF 20-25%; c. 07/2015 Echo: EF 30-35%, Gr1 DD, sev LVH.  [name] Chronic venous insufficiency 04/12/2010  . Closed displaced fracture of left femoral neck ([place]) 06/14/2014   s/p left hip hemiarthroplasty June 14, 2014   . Constipation 05/25/2009   Intermittent   . Coronary artery disease 04/02/2006   a. s/p RCA DES (2.5 x 24 mm TAXUS drug-eluting stent) 2006;  b. low risk myoview in 2011; c. 5/17 MV: EF 28%, large inf apical and inflat infarct w/o ischemia.  . Degenerative joint disease involving multiple joints 02/02/2007  . End stage renal disease on dialysis [facility])    a. TTS Dialysis - currently through right chest diatek, pending AVF.  [name] GERD (gastroesophageal reflux disease)   . Hepatitis    Hep C  . History of blood transfusion 05/2014   "related to hip OR"  . Hx of AKA (above knee amputation), right ([place]) 06/12/2016  . Hyperlipidemia 04/02/2006  . Hypertension   . Hypertensive heart disease 12/09/2006  . Hypothyroidism   . Internal and external hemorrhoids without complication 6/57/8469  . Microcytic normochromic anemia 05/27/2006  . Obesity (BMI 30.0-34.9) 01/15/2012  . Phantom limb pain ([place]) 06/13/2016  . 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 Type 2 diabetes mellitus with neurological manifestations ([place]) 04/02/2006   Neuropathy of the left foot   . 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 Type 2 diabetes mellitus with peripheral artery disease ([place]) 05/27/2006   Absent pulses in the left foot   . Type 2 diabetes mellitus with stage 5 chronic kidney disease ([place]) 08/06/2012   Social History   Socioeconomic History  . Marital status: Married    Spouse name: None  . Number of children: None  . Years of education: None  . Highest education level: None  Social Needs  . Financial resource strain: None  . Food  insecurity - worry: None  . Food insecurity - inability: None  . Transportation needs - medical: None  . Transportation needs - non-medical: None  Occupational History  . None  Tobacco Use  . Smoking status: Former Smoker    Packs/day: 1.00    Years: 10.00    Pack years: 10.00    Types: Cigarettes    Last attempt to quit: 06/16/1968    Years since quitting: 48.7  . Smokeless tobacco: Never Used  Substance and Sexual Activity  . Alcohol use: No    Alcohol/week: 0.0 oz    Comment: "quit alcohol in the 1960's"  . Drug use: No  . Sexual activity: Not Currently    Birth control/protection: None  Other Topics Concern  . None  Social History Narrative  . None   Family History  Problem Relation Age of Onset  . Hypertension Mother   . Heart attack Father   . Breast cancer Sister   . Arthritis Sister   . Heart attack Brother   . Diabetes Brother   . Stroke Brother   . Pneumonia Daughter   . Diabetes Brother   . Alcoholism Brother   . Diabetes Brother   . Arthritis Sister        Bilateral knee replacement  . HIV Daughter   . Hypertension Daughter   .  Drug abuse Daughter   . Schizophrenia Daughter   . Hypertension Daughter   . Drug abuse Daughter   . Bipolar disorder Daughter    Scheduled Meds: . lidocaine (PF)      . midazolam       Continuous Infusions: PRN Meds:.fentaNYL (SUBLIMAZE) injection Medications Prior to Admission:  Prior to Admission medications   Medication Sig Start Date End Date Taking? Authorizing Provider  acetaminophen (TYLENOL) 500 MG tablet Take 500-1,000 mg by mouth 2 (two) times daily as needed for moderate pain.   Yes [provider]  amiodarone (PACERONE) 200 MG tablet Take 1 tablet (200 mg total) daily by mouth. 01/26/17  Yes Oval Linsey, MD  atorvastatin (LIPITOR) 40 MG tablet Take 1 tablet (40 mg total) by mouth every evening. 12/12/16 03/12/17 Yes Oval Linsey, MD  calcium citrate-vitamin D (CITRACAL+D) 315-200 MG-UNIT per tablet Take 1 tablet by mouth daily.    Yes [provider]  carvedilol (COREG) 6.25 MG tablet Take 1 tablet (6.25 mg total) by mouth 2 (two) times daily. Patient taking differently: Take 6.25 mg by mouth See admin instructions. 6.25 mg TWO times a day on Sun/Mon/Wed/Fri and 6.25 ONCE a day in the evening on Tues/Thurs/Sat 06/30/16  Yes Maryellen Pile, MD  famotidine (PEPCID) 20 MG tablet TAKE 1 TABLET BY MOUTH BEFORE BREAKFAST AND 1 TABLET BEFORE SUPPER Patient taking differently: Take 20 mg by mouth two times a day before meals (BREAKFAST and SUPPER) 12/31/16  Yes Crenshaw, Denice Bors, MD  fish oil-omega-3 fatty acids 1000 MG capsule Take 1,000 mg by mouth daily.  01/15/12  Yes Oval Linsey, MD  fluticasone (FLONASE) 50 MCG/ACT nasal spray Place 1 spray into both nostrils daily as needed for allergies. 12/12/16  Yes Oval Linsey, MD  gabapentin (NEURONTIN) 300 MG capsule Take 1 capsule (300 mg total) by mouth daily as needed (for stump pain). 06/13/16  Yes Oval Linsey, MD  hydroxypropyl methylcellulose (ISOPTO TEARS) 2.5 % ophthalmic solution Place 2 drops into  both eyes 3 (three) times daily as needed for dry eyes. Reported on 04/20/2015   Yes [provider]  levothyroxine (SYNTHROID, [place])  25 MCG tablet Take 1 tablet (25 mcg total) by mouth daily before breakfast. 09/17/16  Yes Oval Linsey, MD  Multiple Vitamins-Minerals (MULTIVITAMIN WITH MINERALS) tablet Take 1 tablet by mouth daily.   Yes [provider]  ondansetron (ZOFRAN) 4 MG tablet Take 1 tablet (4 mg total) by mouth every 8 (eight) hours as needed for nausea. 03/03/16  Yes Oval Linsey, MD  warfarin (COUMADIN) 2.5 MG tablet Take 2.5-3.75 mg by mouth See admin instructions. Take 1 and 1/2 tablets on Sunday, Tuesday and Thursday then take 1 tablet all the other days 12/31/16  Yes [provider]   Allergies  Allergen Reactions  . Lisinopril Other (See Comments)    Acute kidney injury  . Amoxicillin Swelling and Other (See Comments)    Puffy eyes & abdominal pain Has patient had a PCN reaction causing immediate rash, facial/tongue/throat swelling, SOB or lightheadedness with hypotension: Yes Has patient had a PCN reaction causing severe rash involving mucus membranes or skin necrosis: Unknown Has patient had a PCN reaction that required hospitalization: Unknown Has patient had a PCN reaction occurring within the last 10 years: No    . Pravastatin Other (See Comments)    Weakness and fatigue  . Tamsulosin Other (See Comments)    dry throat, sweating, blurred vision  . Doxycycline Rash and Other (See Comments)    Questionable drug rxn rash  . Tape Rash    Medical tape    Review of Systems  Unable to perform ROS: Intubated    Physical Exam  Constitutional: He appears well-developed.  Cardiovascular:  Distal pulses weak, bradycardic  Musculoskeletal: He exhibits edema.  Neurological:  unresponsive  Nursing note and vitals reviewed.   Vital Signs: BP (!) 54/30   Pulse (!) 37   Temp (!) 96.1 F (35.6 C) (Axillary)   Resp 15   Ht '5\' 9"'$   (1.753 m)   Wt 72.8 kg (160 lb 7.9 oz)   SpO2 (!) 80%   BMI 23.70 kg/m  Pain Assessment: CPOT       SpO2: SpO2: (!) 80 % O2 Device:SpO2: (!) 80 % O2 Flow Rate: .O2 Flow Rate (L/min): 10 L/min  IO: Intake/output summary:   Intake/Output Summary (Last 24 hours) at 24-Feb-2017 1134 Last data filed at 24-Feb-2017 1100 Gross per 24 hour  Intake 1910 ml  Output 221 ml  Net 1689 ml    LBM: Last BM Date: (smear) Baseline Weight: Weight: 79.4 kg (175 lb) Most recent weight: Weight: 72.8 kg (160 lb 7.9 oz)     Palliative Assessment/Data:     Thank you for this consult. Palliative medicine will continue to follow and assist as needed.   Time In: 1000  Time Out:1200 Time Total: 120 minutes Prolonged services billed: Yes Greater than 50%  of this time was spent counseling and coordinating care related to the above assessment and plan.  Signed by: Mariana Kaufman, AGNP-C Palliative Medicine    Please contact Palliative Medicine Team phone at [phone] for questions and concerns.  For individual provider: See Shea Evans

## 2017-02-21 NOTE — Progress Notes (Signed)
Patient passed at 9617.02hrs. Two nurses pronounced Toshiyuki Fredell RN/ [facility] RxPat Stramoski. Family at bedside.

## 2017-02-21 NOTE — ED Notes (Signed)
Internal Medicine MDs at bedside.

## 2017-02-21 NOTE — Progress Notes (Signed)
Pharmacy Antibiotic Note  [name] [name] is a 71 y.o. male admitted on 09/23/16 with sepsis.  Pharmacy to transition Azactam to Cefepime this morning. The patient has a PCN allergy listed but has been noted to tolerate cephalosporins previously. Continuing Vancomycin.   Patient is ESRD on HD, however, patient's wife reports last HD was several weeks ago. 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   The patient has received Vancomycin 1500 mg x 1 at 0115 today pre-HD and another 500 mg at 0739 post-HD. The patient only tolerated 1.5 hours of HD this morning before coding. Estimated Vanc level currently ~25 mcg/ml. Last Azactam dose was at 0200.   Plan: 1. D/c Azactam 2. Start Cefepime 1g IV every 24 hours 3. No standing Vancomycin for now - will f/u HD plans 4. Will continue to follow HD schedule/duration, culture results, LOT, and antibiotic de-escalation plans    Temp (24hrs), Avg:98.2 F (36.8 C), Min:96.1 F (35.6 C), Max:101.1 F (38.4 C)  Recent Labs  Lab 02/05/2017 0025 02/12/2017 0031 02/13/2017 0032 02/07/2017 0226 02/04/2017 0732 01/23/2017 0734  WBC 4.7  --   --   --   --   --   CREATININE 12.16*  --  12.50*  --   --  8.84*  LATICACIDVEN  --  0.93  --  0.61 2.9*  --     Estimated Creatinine Clearance: 7.4 mL/min (A) (by C-G formula based on SCr of 8.84 mg/dL (H)).    Allergies  Allergen Reactions  . Lisinopril Other (See Comments)    Acute kidney injury  . Amoxicillin Swelling and Other (See Comments)    Puffy eyes & abdominal pain Has patient had a PCN reaction causing immediate rash, facial/tongue/throat swelling, SOB or lightheadedness with hypotension: Yes Has patient had a PCN reaction causing severe rash involving mucus membranes or skin necrosis: Unknown Has patient had a PCN reaction that required hospitalization: Unknown Has patient had a PCN reaction occurring within the last 10 years: No    . Pravastatin Other (See Comments)    Weakness and  fatigue  . Tamsulosin Other (See Comments)    dry throat, sweating, blurred vision  . Doxycycline Rash and Other (See Comments)    Questionable drug rxn rash  . Tape Rash    Medical tape     Antimicrobials this admission: 11/29 Vanc >>  11/29 Levaquin >>  11/29 Aztreonam >>   Microbiology results:  11/29 MRSA PCR >> neg 11/29 RCx >> 11/29 BCx >>  Thank you for allowing pharmacy to be a part of this patient's care.  Georgina PillionElizabeth Dawanda Mapel, PharmD, BCPS Clinical Pharmacist Pager: [phone] Clinical phone for 02/13/2017 from 7a-3:30p: [phone] If after 3:30p, please call main pharmacy at: [phone] 02/17/2017 11:21 AM

## 2017-02-21 NOTE — H&P (Signed)
Date: March 20, 2017               Patient Name:  [name] [name] MRN: [id number]  DOB: 01/30/1946 Age / Sex: 71 y.o., male   PCP: Doneen PoissonKlima, Lawrence, MD         Medical Service: Internal Medicine Teaching Service         Attending Physician: Dr. Reyes IvanAljishi, Wael Z, MD    First Contact: Dr. Anthonette LegatoHarden Pager: [phone]  Second Contact: Dr. Johnny BridgeSaraiya Pager: [phone]       After Hours (After 5p/  First Contact Pager: [phone]  weekends / holidays): Second Contact Pager: [phone]   Chief Complaint: altered mental status  History of Present Illness:  71 yo male with PMHx significant for ESRD, atrial flutter, cardiomyopathy, CAD presenting with altered mental status after not attending dialysis for one month. The patient was recently admitted on 10/29 for uremia due to missed HD. Patient unable to answer questions, but did state he feels "bad" and feels like he is going to throw up. The patient is accompanied by his wife who provided the history. She states that he has not been to dialysis since his last hospitalization, which was the end of October. Per wife he was in his normal state of health until this AM when she gave him gabapentin for his phantom limb pain, after which he became progressively lethargic as the day progressed.  ED Course: Vitals on arrival: BP 135/64, HR 54, temp 101.1 F (38.4 C), RR 21, SpO2 99 %. Vitals during exam: BP 134/74, pulse (!) 111, temperature (!) 101.1 F (38.4 C), RR 16, SpO2 98% -VTACH HR into 140s  Labs: Hyperkalemia K 6.6, CO2 11, BUN 152; no leukocytosis WBC 4.7 BMP Latest Ref Rng & Units March 20, 2017 March 20, 2017  Glucose 65 - 99 mg/dL 65 57(Q64(L)  BUN 6 - 20 mg/dL 469(G124(H) 295(M152(H)  Creatinine 0.61 - 1.24 mg/dL 84.13(K12.50(H) 44.01(U12.16(H)  BUN/Creat Ratio 10 - 22 - -  Sodium 135 - 145 mmol/L 149(H) 144  Potassium 3.5 - 5.1 mmol/L 6.6(HH) 6.6(HH)  Chloride 101 - 111 mmol/L 125(H) 123(H)  CO2 22 - 32 mmol/L - 11(L)  Calcium 8.9 - 10.3 mg/dL - 7.9(L)   CBC Latest Ref Rng &  Units March 20, 2017 March 20, 2017  WBC 4.0 - 10.5 K/uL - 4.7  Hemoglobin 13.0 - 17.0 g/dL 11.2(L) 9.8(L)  Hematocrit 39.0 - 52.0 % 33.0(L) 31.1(L)  Platelets 150 - 400 K/uL - 83(L)   Meds: IV insulin, calcium chloride, D5 albuterol neb for hyperkalemia, empiric aztreonam, 2L NS boluses, 75 cc/hr maintenance Imaging: CT head negative for acute intracranial process.   Meds:  Current Meds  Medication Sig  . acetaminophen (TYLENOL) 500 MG tablet Take 500-1,000 mg by mouth 2 (two) times daily as needed for moderate pain.  [name]. amiodarone (PACERONE) 200 MG tablet Take 1 tablet (200 mg total) daily by mouth.  [name]. atorvastatin (LIPITOR) 40 MG tablet Take 1 tablet (40 mg total) by mouth every evening.  . calcium citrate-vitamin D (CITRACAL+D) 315-200 MG-UNIT per tablet Take 1 tablet by mouth daily.   . carvedilol (COREG) 6.25 MG tablet Take 1 tablet (6.25 mg total) by mouth 2 (two) times daily. (Patient taking differently: Take 6.25 mg by mouth See admin instructions. 6.25 mg TWO times a day on Sun/Mon/Wed/Fri and 6.25 ONCE a day in the evening on Tues/Thurs/Sat)  . famotidine (PEPCID) 20 MG tablet TAKE 1 TABLET BY MOUTH BEFORE BREAKFAST AND 1 TABLET BEFORE SUPPER (Patient taking differently: Take  20 mg by mouth two times a day before meals (BREAKFAST and SUPPER))  . fish oil-omega-3 fatty acids 1000 MG capsule Take 1,000 mg by mouth daily.   . fluticasone (FLONASE) 50 MCG/ACT nasal spray Place 1 spray into both nostrils daily as needed for allergies.  [name]. gabapentin (NEURONTIN) 300 MG capsule Take 1 capsule (300 mg total) by mouth daily as needed (for stump pain).  . hydroxypropyl methylcellulose (ISOPTO TEARS) 2.5 % ophthalmic solution Place 2 drops into both eyes 3 (three) times daily as needed for dry eyes. Reported on 04/20/2015  . levothyroxine (SYNTHROID, LEVOTHROID) 25 MCG tablet Take 1 tablet (25 mcg total) by mouth daily before breakfast.  . Multiple Vitamins-Minerals (MULTIVITAMIN WITH MINERALS) tablet  Take 1 tablet by mouth daily.  . ondansetron (ZOFRAN) 4 MG tablet Take 1 tablet (4 mg total) by mouth every 8 (eight) hours as needed for nausea.  [name]. warfarin (COUMADIN) 2.5 MG tablet Take 2.5-3.75 mg by mouth See admin instructions. Take 1 and 1/2 tablets on Sunday, Tuesday and Thursday then take 1 tablet all the other days     Allergies: Allergies as of 02/15/2017 - Review Complete 02/09/2017  Allergen Reaction Noted  . Lisinopril Other (See Comments) 11/22/2014  . Amoxicillin Swelling and Other (See Comments) 04/21/2012  . Pravastatin Other (See Comments) 05/26/2014  . Tamsulosin Other (See Comments)   . Doxycycline Rash and Other (See Comments) 12/02/2011  . Tape Rash 01/15/2017   Past Medical History:  Diagnosis Date  . Arthritis    "fingers" (02/07/2015)  . Atrial flutter (HCC) 07/26/2014   a. May 2016, CHA2DS2VASc = 4 -> Eliquis, spontaneous conversion to NSR;  b. On amio;  c. 01/2015 EPS: Unable to induce right sided Aflutter. Non-sustained Afib and Left sided Aflutter noted.  . Benign prostatic hyperplasia with elevated prostate specific antigen (PSA) 10/31/2016   PSA (08/2008): 8.38, PSA (05/2009): 6.63, PSA (11/2009): 13.50, PSA (12/2009): 13.70, PSA (12/2010) 11.99, PSA (06/2011): 11.88, PSA (12/2011): 13.93, PSA (12/2014): 13.49 TRUS/BX (10/2008) Prostate 54 cc, Pathology BPH with single core of chronic inflammation  . Benign prostatic hypertrophy with nocturia 07/07/2006  . Cardiomyopathy (HCC)    a. 07/2014 Echo: EF 50%; b. 03/2015 Ech: EF 20-25%; c. 07/2015 Echo: EF 30-35%, Gr1 DD, sev LVH.  [name]. Chronic venous insufficiency 04/12/2010  . Closed displaced fracture of left femoral neck (HCC) 06/14/2014   s/p left hip hemiarthroplasty June 14, 2014   . Constipation 05/25/2009   Intermittent   . Coronary artery disease 04/02/2006   a. s/p RCA DES (2.5 x 24 mm TAXUS drug-eluting stent) 2006;  b. low risk myoview in 2011; c. 5/17 MV: EF 28%, large inf apical and inflat infarct w/o ischemia.    . Degenerative joint disease involving multiple joints 02/02/2007  . End stage renal disease on dialysis [facility](HCC)    a. TTS Dialysis - currently through right chest diatek, pending AVF.  [name]. GERD (gastroesophageal reflux disease)   . Hepatitis    Hep C  . History of blood transfusion 05/2014   "related to hip OR"  . 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 Hyperlipidemia 04/02/2006  . Hypertension   . Hypertensive heart disease 12/09/2006  . Hypothyroidism   . Internal and external hemorrhoids without complication 08/20/2012  . Microcytic normochromic anemia 05/27/2006  . Obesity (BMI 30.0-34.9) 01/15/2012  . Phantom limb pain (HCC) 06/13/2016  . 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 Type 2 diabetes mellitus with peripheral artery disease (HCC) 05/27/2006   Absent pulses in the left foot   . 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 Hypertension Mother   . Heart attack Father   . Breast cancer Sister   . Arthritis Sister   . Heart attack Brother   . Diabetes Brother   . Stroke Brother   . Pneumonia Daughter   . Diabetes Brother   . Alcoholism Brother   . Diabetes Brother   . Arthritis Sister        Bilateral knee replacement  . HIV Daughter   . Hypertension Daughter   . Drug abuse Daughter   . Schizophrenia Daughter   . Hypertension Daughter   . Drug abuse Daughter   . Bipolar disorder Daughter    Social History:  Social History   Tobacco Use  . Smoking  status: Former Smoker    Packs/day: 1.00    Years: 10.00    Pack years: 10.00    Types: Cigarettes    Last attempt to quit: 06/16/1968    Years since quitting: 48.7  . Smokeless tobacco: Never Used  Substance Use Topics  . Alcohol use: No    Alcohol/week: 0.0 oz    Comment: "quit alcohol in the 1960's"  . Drug use: No    Review of Systems: A complete ROS was negative except as per HPI.   Physical Exam: Blood pressure (!) 107/27, pulse (!) 120, temperature 97.8 F (36.6 C), temperature source Oral, resp. rate 19, height 5\' 9"  (1.753 m), weight 175 lb (79.4 kg), SpO2 99 %. Physical Exam  Constitutional: He appears lethargic. He has a sickly appearance. He appears ill.  HENT:  Head: Normocephalic and atraumatic.  Mouth/Throat: Oropharynx is clear and moist.  Eyes:  Pinpoint pupils bilaterally  Cardiovascular: Intact distal pulses. An irregular rhythm present. Tachycardia present.  Abdominal: Soft. Bowel sounds are decreased. There is no tenderness.  Neurological: He appears lethargic.  Skin: He is diaphoretic.    EKG: personally reviewed my interpretation is sinus bradycardia, T wave inversions in lateral leds  CXR: personally reviewed my interpretation is poor quality portable CXR, bilateral pleural effusions  Assessment & Plan by Problem: Active Problems:   Altered mental status   Acute encephalopathy  Initially seen by IMTS in ED to evaluate for admission. During examination patient went into [facility] with pulse and became acutely more lethargic. IMTS coordinated with nephrology and PCCM. Per Dr. Juel Burrow orders urgently brought patient to HD for emergent dialysis. Upon waiting for initation of dialysis, rapid response team was called to the bedside to assist in care. At this time calcium gluconate was administered. Critical Care intubated the patient as the patient was vomiting and was at risk for aspiration. PCCM resumed care as primary team.   Dispo: Admit patient to Inpatient  with expected length of stay greater than 2 midnights.  Signed: Toney Rakes, MD 01/24/2017, 5:57 AM  Pager: [phone]

## 2017-02-21 NOTE — Progress Notes (Signed)
CRITICAL VALUE ALERT  Critical Value:  Lac 2.9  Date & Time Notied:  02/02/2017 @ 09.35  Provider Notified: Dr. Molli KnockYacoub  Orders Received/Actions taken: No new orders received

## 2017-02-21 NOTE — ED Notes (Signed)
First set of blood cx obtained at [facility]0025

## 2017-02-21 NOTE — Progress Notes (Signed)
PULMONARY / CRITICAL CARE MEDICINE   Name: [name] [name] MRN: [id number] DOB: [birth year]    ADMISSION DATE:  02/12/2017 CONSULTATION DATE:  2017/03/16  REFERRING MD:  Dr. Christy Gentles  CHIEF COMPLAINT:  AMS  HISTORY OF PRESENT ILLNESS:  HPI obtained from medical chart review as patient is acutely encephalopathic.    71 year old male with extensive PMH significant for but not limited to ESRD on HD, uremia, DM, HLD, PAF/flutter, HTN, GERD, right AKA, and BPH.  He has not had dialysis for one month.  Presented to the ER on 11/28 with progressive confusion, lethargy and weakness starting after breakfast on 11/28.  Of note, he was admitted on 10/25 with chest pain and dyspnea after missing dialysis for 9 days but left AMA before receiving dialysis.  Admitted on 10/29-10/30 for same symptoms and uremia and underwent dialysis twice with improvement in mental status and discharged home with plans to continue outpatient dialysis.    In the ED, patient initially oriented with generalized weakness. Noted for fever 101.1 rectal.  Labs noted for K 6.6, Na 149, BUN 124, sCr 12.50, troponin 0.14, WBC 4.7, Hgb 9.8, Plts 83, normal lactate, INR 2.61.  EKG with wide complex sinus bradycardia initially then wide complex aflutter . CXR consistent with pulmonary edema.  He was treated for hyperkalemia and taken for emergent dialysis.  In dialysis, patent minimally responsive.  Noted to have episode of vomiting and aspiration.  He was thought not to be able to protect his airway and therefore intubated.  PCCM to admit.    SUBJECTIVE: Had brief 2 minute PEA arrest during HD, ROSC after 1 round of epi. Intubated and transferred to CCU.  Did not complete full session HD (of note, has missed last month).  This AM, tele strip shows sinus tach.  Repeat labs pending.  Discussion with wife this AM regarding code status > decided for DNR.  VITAL SIGNS: BP (!) 125/54 (BP Location: Right Arm)   Pulse (!) 117   Temp  97.8 F (36.6 C) (Oral)   Resp 16   Ht _0  (1.753 m)   Wt 79.4 kg (175 lb)   SpO2 97%   BMI 25.84 kg/m   HEMODYNAMICS:    VENTILATOR SETTINGS: Vent Mode: PRVC FiO2 (%):  [60 %-100 %] 100 % Set Rate:  [15 bmp] 15 bmp Vt Set:  [600 mL] 600 mL PEEP:  [5 cmH20] 5 cmH20 Plateau Pressure:  [14 cmH20-21 cmH20] 21 cmH20  INTAKE / OUTPUT: I/O last 3 completed shifts: In: [place] [IV Piggyback:1750] Out: -   PHYSICAL EXAMINATION: General:  Chronically ill appearing male, critically ill HEENT: MM pale/dry, +JVD Neuro: Unresponsive CV:  Sinus tach. PULM: even/non-labored, bilateral rales. GI: soft, bs active.  Extremities: warm/dry, R AKA, RUE fistula - site well appearing.  Skin: no rashes.  LABS:  BMET Recent Labs  Lab 2017-03-16 0025 03-16-17 0032  NA 144 149*  K 6.6* 6.6*  CL 123* 125*  CO2 11*  --   BUN 152* 124*  CREATININE 12.16* 12.50*  GLUCOSE 64* 65    Electrolytes Recent Labs  Lab 2017-03-16 0025  CALCIUM 7.9*    CBC Recent Labs  Lab 03/16/17 0025 03/16/2017 0032  WBC 4.7  --   HGB 9.8* 11.2*  HCT 31.1* 33.0*  PLT 83*  --     Coag's Recent Labs  Lab 03-16-2017 0025  INR 2.61    Sepsis Markers Recent Labs  Lab 2017/03/16 0031 March 16, 2017 0226  LATICACIDVEN  0.93 0.61    ABG Recent Labs  Lab February 23, 2017 0653  PHART 7.333*  PCO2ART 33.3  PO2ART 82.1*    Liver Enzymes Recent Labs  Lab 02-23-2017 0025  AST 15  ALT 11*  ALKPHOS 55  BILITOT 1.0  ALBUMIN 2.9*    Cardiac Enzymes No results for input(s): TROPONINI, PROBNP in the last 168 hours.  Glucose Recent Labs  Lab 2017-02-23 0135  GLUCAP 68    Imaging Ct Head Wo Contrast  Result Date: 02-23-17 CLINICAL DATA:  Altered level of consciousness EXAM: CT HEAD WITHOUT CONTRAST TECHNIQUE: Contiguous axial images were obtained from the base of the skull through the vertex without intravenous contrast. COMPARISON:  MRI 03/11/2006 FINDINGS: Brain: No acute territorial infarction,  hemorrhage or intracranial mass. Moderate atrophy. Moderate small vessel ischemic changes of the white matter. Old lacunar infarcts in the thalamus and bilateral basal ganglia. Prominent ventricles felt secondary to atrophy. Vascular: No hyperdense vessels. Vertebral artery and carotid artery calcification Skull: Normal. Negative for fracture or focal lesion. Sinuses/Orbits: Mucosal thickening in the sphenoid and ethmoid sinuses. Mucous retention cyst in the right maxillary sinus. Old right medial wall orbital fracture. Other: None IMPRESSION: No CT evidence for acute intracranial abnormality. Atrophy and small vessel ischemic changes of the white matter Electronically Signed   By: Donavan Foil M.D.   On: 02/23/17 01:02   Dg Chest Port 1 View  Addendum Date: Feb 23, 2017   ADDENDUM REPORT: 02/23/2017 04:56 ADDENDUM: Addendum created to correct a voice recognition error in the findings. The second sentence should read: "Vascular stent in the region of the right brachiocephalic/subclavian." Electronically Signed   By: Jeb Levering M.D.   On: 02-23-2017 04:56   Result Date: Feb 23, 2017 CLINICAL DATA:  Sepsis. EXAM: PORTABLE CHEST 1 VIEW COMPARISON:  Radiographs 01/19/2017 FINDINGS: Cardiomegaly is similar to prior. Vascular stents in the region of the right and on the mid/subclavian. Layering right pleural effusion with hazy opacity in the right lung. Improved left pleural effusion from prior exam. Vascular congestion without overt edema. No pneumothorax. IMPRESSION: Bilateral pleural effusions, layering on the right. Left pleural effusion has improved from prior exam. Cardiomegaly is stable. Electronically Signed: By: Jeb Levering M.D. On: 2017-02-23 00:41   Dg Chest Port 1 View  Result Date: 02/23/17 CLINICAL DATA:  Intubation. EXAM: PORTABLE CHEST 1 VIEW COMPARISON:  Earlier this day at 0019 hour FINDINGS: Endotracheal tube 4.4 cm in the carina. Cardiomegaly again seen, unchanged. Layering  right pleural effusion appears similar. Suspect small left pleural effusion. Slight worsening vascular congestion. No pneumothorax. IMPRESSION: 1. Endotracheal tube 4.4 cm from the carina. 2. Unchanged cardiomegaly, vascular congestion and layering right pleural effusion. Electronically Signed   By: Jeb Levering M.D.   On: 02/23/2017 04:55   STUDIES:  CXR 11/29 >> Bilateral pleural effusions, layering on the right.  CXR 11/29 >> ETT appropriately positioned.  CULTURES: 11/29 MRSA PCR >> 11/29 BC x 2 >> 11/29 trach asp >>  ANTIBIOTICS: 11/29 Vanc >> 11/29 Aztreonam >>  SIGNIFICANT EVENTS: 11/29 Admit, 2 minute PEA arrest.  Code status changed to DNR after transfer to CCU.  LINES/TUBES: PIV x 2 ETT 11/29 >> OGT 11/29 >>  DISCUSSION: 41 yoM with extensive PMH w/ESRD noncompliant with HD x 1 month presents with fever, lethargy, and weakness found to be uremic and hyperkalemic.  Taken for emergent HD where patient vomited and could not protect his airway due to AMS and therefore intubated.    DNR status after discussion early AM  with pt's wife  ASSESSMENT / PLAN:  PULMONARY A: Acute respiratory insufficiency in the setting of acute encephalopathy - s/p intubation Pulmonary edema with effusions R>L Probable aspiration P:   Continue full vent support No weaning given hemodynamic instability Volume removal per HD Empiric abx Follow CXR  CARDIOVASCULAR A:  Bradycardia- secondary to hyperkalemia; now resolved Aflutter - now appears sinus tach Elevated troponin- likely demand Acute volume overload  Hx HTN, ICM, PAD, CAD P:  Tele monitoring Electrolyte correction with HD Trend troponins Coumadin as below Continue home amio Hold home coreg DNR status  RENAL A:   ESRD non compliant with HD Uremia Hyperkalemia Hypocalcemia Hypernatremia P:   Empiric 1 amp D50, 10u insulin, 1 amp HCO3, 30g kayexalate Repeat STAT labs now and again this PM HD per  Renal  GASTROINTESTINAL A:   NPO P:   OGT  PPI for SUP Bowel regimen per PAD protocol  HEMATOLOGIC A:   Anemia- chronic illness/ ESRD Thrombocytopenia  On chronic anticoagulation  P:  Trend CBC and coags Aranesp per renal Coumadin per pharmacy protocol   INFECTIOUS A:   Possible aspiration P:   Empiric coverage with vanc and aztreonam with PCN allergy, narrow as able Pan- culture  ENDOCRINE A:   DM Hypoglycemia on arrival  P:   CBG q1 x 4 then q 4  NEUROLOGIC A:   Acute encephalopathy- likely multifactorial given uremia, possible sepsis w/fever ? Element of dementia ? Failure to thrive - recent non compliance with HD P:   RASS goal: 0/-1 PAD protocol with propofol if needed and prn fentanyl Daily WUA DNR status for now.  If no improvement then will consider switching to comfort care  Global: PMT consulted to help establish goals of care with patient given his multiple chronic medical conditions, leaving AMA, and noncompliance with medical care over the last 2 months despite patient remaining a full code.  Additionally, we many need a psych consult when appropriate to help determine if patient is capable of making his medical decisions, as this has been questioned before.    FAMILY  - Updates: Wife [name] [name]) updated 7AM on 11/29.  Opted for DNR status if arrests again; otherwise, continue full supportive care for now.  - Inter-disciplinary family meet or Palliative Care meeting due by:  02/24/2017  CCT 31mns  RMontey Hora PAshtonPulmonary & Critical Care Medicine Pager: [phone] or ([phone] 7:27 AM  Attending Note:  71year old male with PMH above presenting post cardiac arrest.  Patient evidently has been sick for quite sometime and family states he would not want this level of care.  Patient is completely unresponsive on exam.  I reviewed CXR myself, ETT is in good position.  Palliative care met with family, DNR  status confirmed and plan is to withdraw today.  Palliative care leading the process.  PCCM is in a support role.  Will extubate to comfort when family is ready.  The patient is critically ill with multiple organ systems failure and requires high complexity decision making for assessment and support, frequent evaluation and titration of therapies, application of advanced monitoring technologies and extensive interpretation of multiple databases.   Critical Care Time devoted to patient care services described in this note is  45  Minutes. This time reflects time of care of this signee Dr WJennet Maduro This critical care time does not reflect procedure time, or teaching time or supervisory time of  PA/NP/Med student/Med Resident etc but could involve care discussion time.  Rush Farmer, M.D. [facility] Pulmonary/Critical Care Medicine. Pager: [phone]. After hours pager: [phone].

## 2017-02-21 NOTE — Procedures (Signed)
Intubation Procedure Note [name] [name] [id number] 06/07/1945  Procedure: Intubation Indications: Respiratory insufficiency  Procedure Details Consent: Unable to obtain consent because of altered level of consciousness. Time Out: Verified patient identification, verified procedure, site/side was marked, verified correct patient position, special equipment/implants available, medications/allergies/relevent history reviewed, required imaging and test results available.  Performed  Maximum sterile technique was used including gloves and mask.  MAC and 3    Evaluation Hemodynamic Status: BP stable throughout; O2 sats: stable throughout Patient's Current Condition: stable Complications: No apparent complications Patient did tolerate procedure well. Chest X-ray ordered to verify placement.  CXR: pending.   [name] [name] 02-23-17

## 2017-02-21 NOTE — ED Notes (Signed)
Pt's wife reports pt did not get OOB yesterday, but was alert and oriented.  She states today, pt did not get OOB, and was not himself.  Pt appears lethargic, responds to voice.  Pt is alert only to self and situation.  Pt denies pain but moaned and groaned when he is moved.  THen, he reports R side pain.  Pt is able to move all extremities but is weak, more on the right.  Pt follows commands.  Pt's wife reports pt has not had his dialysis for 3 weeks almost a month.  She reports pt is in the process of switching dialysis center and has refused to go.  Pt's lips and mouth is warm and very dry.  His lips and flaking. Pt's speech is slurred.

## 2017-02-21 NOTE — ED Notes (Signed)
Per EMS,  Pt from home, pt's wife reports AMS since after breakfast today.  States after pt ate breakfast, she "stepped out for a little, when I came back he's still not up."  Pt is lethargic, denies any pain.  Pt is alert to self and place.

## 2017-02-21 NOTE — ED Notes (Signed)
RN notified about CBG (68)

## 2017-02-21 NOTE — Significant Event (Signed)
PCCM Interval Note  I have had extensive discussions with patient's wife, [name] as she has arrived. We discussed 's events occurring in hemodialysis including his cardiac arrest, current circumstances, and organ failures. We also discussed patient's prior wishes under circumstances such as this.  She feels that he would not want any further CPR or aggressive life support.  She has decided not to perform resuscitation if arrest were to occur again, but to otherwise continue with current medical support / therapies without augmentation of care.  Candelaria StagersChaplin is currently at the bedside with her providing ongoing support.    Posey BoyerBrooke Simpson, AGACNP-BC [place] [facility] Pgr: [phone] or if no answer [phone] 02/18/2017, 7:25 AM

## 2017-02-21 NOTE — Progress Notes (Signed)
HD tx initiated via 15G x2 w/o problem, pull/push/flush equally w/o problem, VSS, Dr. Juel BurrowLin and RR nurse at bedside, will cont to monitor while on HD tx

## 2017-02-21 NOTE — Progress Notes (Signed)
150 cc fentanyl drip wasted in sink followed by water flush with Landry DykeEmer Colleran, RN

## 2017-02-21 NOTE — Progress Notes (Signed)
Pt seen and examined.  Is sp cardiac arrest, resp failure.  Had HD 1.5 hrs overnight for ^K 6.4.  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 CXR showed pulm edema.  Went up for HD, had 1- 1.5 hrs of HD, and then arrested.  Repeat K is pending this am.  BP's are soft.  Pt is now DNR, no escalation of care.  Not candidate for CRRT.  Will plan regular HD as needed if he will tolerate. Awaiting repeat labs this am.    Vinson Moselleob Marshella Tello MD [facility] pgr [phone]   02/16/2017, 9:35 AM

## 2017-02-21 NOTE — Consult Note (Addendum)
Reason for Consult: Hyperkalemia Referring Physician: Dr. Rochele Pages  Chief Complaint: AMS  Dialyzed at [place]. 4 hours- profile #4 HD Bath 2/2, Dialyzer 180, Heparin yes, 3000 bolus. Access right AVF.  hectorol 1 mcg q tx, mircera 100 given 10/9- last hgb 9.8, phos 4.5 and PTH 560  Assessment/Plan: 1. AMS - likely from uremia but certainly need to rule out other causes. 2. ESRD with hyperkalemia - Emergent HD with 1K bath for 1st 2 hrs and then 2K. - Will re-evaluate in the AM and decide if he needs another treatment in the afternoon. 4. Anemia of ESRD: received Mircera on 10/9- will dose with aranesp here later today. 5. Metabolic Bone Disease: labs at OP unit - phos 4.5 and PTH 560 - cont hectorol - no binder listed on med list 6. Dementia- could be a big issue- ie not sure we will find an HD unit that will suit  7. CASHD  Seen on HD on 02-26-2017 @ 4:53AM Change to 1K bath from 0K (I do not see any higher K than 6.6) Rt BCF soft (partially from the cardiac function) Change to Qb/Qd 300/500 given pt has not had dialysis in over a month to decr risk of dysequilibrium.  Seen again @ 625am Pt coded again, given 1165m of NS, BP improved. Again, prior to bringing the pt up I discussed case with the admitting team and specifically stated that this pt should be dialyzed in the ICU given the critical nature. The admitting team contacted the oncall critical care doctor who would not take the pt bec of lack of beds, etc, stating that the pt needed to be dialyzed 1st and we were forced to either bring the pt up to the unit with close supervision or let the pt code with critical hyperkalemia in the ED. We [facility]ze in the ED as it is not equipped to dialyze pts. I specifically reiterated to the admitting team physician that this is unorthodox but agreed that it was also critical that the pt receive dialysis ASAP given the widened QRS +  pt brady-ing down at times. We  brought the pt up to dialyze with these reservations given that the pt would almost certainly code in the ED. Pt BP dropped during treatment and the UF was immediately turned off and the pt was started on NS before he was coded.  HPI: [name] LUPERCIOis an 71y.o. male with PMhx DM, HTN, PAD s/p right AKA, ischemic cardiomyopathy and [facility] [place] [facility]  The issues are unclear as pt appears to have dementia and is going off on the personnel at the kidney center- possibly issues with cannulation and not taking him off the machine in time to catch his bus home.  [facility]  He has not been back since.   Pt brought here for altered mental status and has not received dialysis in over a month. When he presented in 01/15/2017 for chest pain he left AMA without receiving dialysis. He was noted to be hyperkalemic at 6.6 with a HCO3 of 11 and BUN/Cr of 124/12.5 w/ widened QRS vs aflutter. He was brought up to the HD unit to initiate HD with the rapid response unit present as well. He was given protocol for hyperkalemia in the ED + Ca in the HD unit.  ROS Review of systems not obtained due to patient factors.  Chemistry and CBC: Creat  Date/Time Value Ref Range Status  09/17/2015 10:46 AM 4.09 (H) 0.70 - 1.18 mg/dL Final    Comment:      For patients > or = 73 years of age: The upper reference limit for Creatinine is approximately 13% higher for people identified as African-American.     08/10/2015 10:55 AM 3.36 (H) 0.70 - 1.18 mg/dL Final  02/02/2015 09:37 AM 3.32 (H) 0.70 - 1.18 mg/dL Final  08/22/2014 02:39 PM 1.69 (H) 0.50 - 1.35 mg/dL Final  05/26/2014 12:12 PM 1.92 (H) 0.50 - 1.35 mg/dL Final  02/14/2014 10:50 AM 2.79 (H) 0.50 - 1.35 mg/dL Final  02/10/2014 10:58 AM 2.58 (H) 0.50 - 1.35 mg/dL Final  07/01/2013 11:23 AM 1.89 (H) 0.50 - 1.35 mg/dL Final   05/30/2013 10:17 AM 1.73 (H) 0.50 - 1.35 mg/dL Final  05/10/2012 11:02 AM 1.62 (H) 0.50 - 1.35 mg/dL Final  05/23/2011 02:34 PM 1.51 (H) 0.50 - 1.35 mg/dL Final  05/22/2011 04:35 PM 1.67 (H) 0.50 - 1.35 mg/dL Final  12/12/2010 11:26 AM 1.49 (H) 0.50 - 1.35 mg/dL Final  08/16/2010 03:48 PM 1.62 (H) 0.40 - 1.50 mg/dL Final  06/17/2010 10:49 AM 1.32 0.40 - 1.50 mg/dL Final  06/14/2010 04:03 PM 1.45 0.40 - 1.50 mg/dL Final   Creatinine, Ser  Date/Time Value Ref Range Status  03-06-2017 12:32 AM 12.50 (H) 0.61 - 1.24 mg/dL Final  03-06-2017 12:25 AM 12.16 (H) 0.61 - 1.24 mg/dL Final  01/20/2017 01:30 AM 8.76 (H) 0.61 - 1.24 mg/dL Final  01/19/2017 07:49 AM 8.48 (H) 0.61 - 1.24 mg/dL Final  01/15/2017 02:54 PM 7.59 (H) 0.61 - 1.24 mg/dL Final  11/05/2016 12:00 PM 5.80 (H) 0.61 - 1.24 mg/dL Final  09/23/2016 03:55 PM 7.11 (H) 0.61 - 1.24 mg/dL Final  08/20/2016 02:18 AM 5.16 (H) 0.61 - 1.24 mg/dL Final  11/19/2015 10:09 AM 6.47 (H) 0.61 - 1.24 mg/dL Final  06/19/2015 04:35 PM 3.93 (H) 0.61 - 1.24 mg/dL Final  06/18/2015 08:00 AM 3.08 (H) 0.61 - 1.24 mg/dL Final  06/17/2015 04:31 AM 2.55 (H) 0.61 - 1.24 mg/dL Final    Comment:    DELTA CHECK NOTED  06/16/2015 06:29 AM 3.95 (H) 0.61 - 1.24 mg/dL Final  06/13/2015 07:00 AM 3.43 (H) 0.61 - 1.24 mg/dL Final  06/12/2015 08:59 AM 4.25 (H) 0.61 - 1.24 mg/dL Final  06/11/2015 07:33 AM 4.34 (H) 0.61 - 1.24 mg/dL Final  06/10/2015 04:51 AM 4.13 (H) 0.61 - 1.24 mg/dL Final  06/09/2015 06:28 AM 5.59 (H) 0.61 - 1.24 mg/dL Final  06/08/2015 11:41 AM 5.83 (H) 0.61 - 1.24 mg/dL Final  06/01/2015 11:29 AM 5.28 (H) 0.61 - 1.24 mg/dL Final  04/10/2015 03:38 PM 4.46 (H) 0.76 - 1.27 mg/dL Final  04/02/2015 07:20 AM 5.00 (H) 0.61 - 1.24 mg/dL Final  04/01/2015 04:59 AM 4.90 (H) 0.61 - 1.24 mg/dL Final  03/31/2015 05:38 AM 5.05 (H) 0.61 - 1.24 mg/dL Final  03/30/2015 05:55 AM 4.84 (H) 0.61 - 1.24 mg/dL Final  03/29/2015 10:42 AM 4.88 (H) 0.61 - 1.24 mg/dL  Final  02/12/2015 02:52 PM 3.74 (H) 0.61 - 1.24 mg/dL Final  12/20/2014 10:52 AM 2.74 (H) 0.76 - 1.27 mg/dL Final  12/13/2014 04:03 AM 2.83 (H) 0.61 - 1.24 mg/dL Final  12/12/2014 05:06 AM 2.80 (H) 0.61 - 1.24 mg/dL Final  12/11/2014 04:30 AM 2.90 (H) 0.61 - 1.24 mg/dL Final  12/10/2014 10:52 AM 3.31 (H)  0.61 - 1.24 mg/dL Final  12/04/2014 09:51 AM 3.30 (H) 0.40 - 1.50 mg/dL Final  11/22/2014 10:29 AM 2.59 (H) 0.76 - 1.27 mg/dL Final  10/26/2014 01:56 PM 2.46 (H) 0.76 - 1.27 mg/dL Final  07/28/2014 02:43 AM 2.05 (H) 0.61 - 1.24 mg/dL Final  07/27/2014 01:00 AM 1.77 (H) 0.61 - 1.24 mg/dL Final  07/26/2014 05:54 AM 1.97 (H) 0.61 - 1.24 mg/dL Final  07/25/2014 12:59 PM 2.40 (H) 0.61 - 1.24 mg/dL Final  07/25/2014 12:49 PM 2.60 (H) 0.61 - 1.24 mg/dL Final  06/22/2014 06:00 AM 2.23 (H) 0.50 - 1.35 mg/dL Final  06/21/2014 12:24 PM 2.27 (H) 0.50 - 1.35 mg/dL Final  06/16/2014 08:28 AM 2.37 (H) 0.50 - 1.35 mg/dL Final  06/15/2014 07:08 AM 2.19 (H) 0.50 - 1.35 mg/dL Final  06/14/2014 04:40 AM 2.16 (H) 0.50 - 1.35 mg/dL Final  06/13/2014 10:10 PM 2.37 (H) 0.50 - 1.35 mg/dL Final  05/25/2013 04:03 AM 1.62 (H) 0.50 - 1.35 mg/dL Final  05/24/2013 09:00 PM 1.77 (H) 0.50 - 1.35 mg/dL Final  05/24/2013 11:30 AM 1.64 (H) 0.50 - 1.35 mg/dL Final  04/22/2012 09:05 AM 1.62 (H) 0.50 - 1.35 mg/dL Final  04/21/2012 06:10 AM 1.82 (H) 0.50 - 1.35 mg/dL Final  04/20/2012 12:38 PM 1.99 (H) 0.50 - 1.35 mg/dL Final   Recent Labs  Lab 2017-02-28 0025 2017/02/28 0032  NA 144 149*  K 6.6* 6.6*  CL 123* 125*  CO2 11*  --   GLUCOSE 64* 65  BUN 152* 124*  CREATININE 12.16* 12.50*  CALCIUM 7.9*  --    Recent Labs  Lab February 28, 2017 0025 02/28/2017 0032  WBC 4.7  --   NEUTROABS 2.8  --   HGB 9.8* 11.2*  HCT 31.1* 33.0*  MCV 77.8*  --   PLT 83*  --    Liver Function Tests: Recent Labs  Lab 02-28-17 0025  AST 15  ALT 11*  ALKPHOS 55  BILITOT 1.0  PROT 7.4  ALBUMIN 2.9*   No results for input(s): LIPASE,  AMYLASE in the last 168 hours. Recent Labs  Lab 02-28-2017 0025  AMMONIA 23   Cardiac Enzymes: No results for input(s): CKTOTAL, CKMB, CKMBINDEX, TROPONINI in the last 168 hours. Iron Studies: No results for input(s): IRON, TIBC, TRANSFERRIN, FERRITIN in the last 72 hours. PT/INR: _0 (inr:5)  Xrays/Other Studies: ) Results for orders placed or performed during the hospital encounter of 02/07/2017 (from the past 48 hour(s))  Comprehensive metabolic panel     Status: Abnormal   Collection Time: 02/28/2017 12:25 AM  Result Value Ref Range   Sodium 144 135 - 145 mmol/L   Potassium 6.6 (HH) 3.5 - 5.1 mmol/L    Comment: CRITICAL RESULT CALLED TO, READ BACK BY AND VERIFIED WITH: TEUP,M RN 02/28/2017 0150 JORDANS    Chloride 123 (H) 101 - 111 mmol/L   CO2 11 (L) 22 - 32 mmol/L   Glucose, Bld 64 (L) 65 - 99 mg/dL   BUN 152 (H) 6 - 20 mg/dL   Creatinine, Ser 12.16 (H) 0.61 - 1.24 mg/dL   Calcium 7.9 (L) 8.9 - 10.3 mg/dL   Total Protein 7.4 6.5 - 8.1 g/dL   Albumin 2.9 (L) 3.5 - 5.0 g/dL   AST 15 15 - 41 U/L   ALT 11 (L) 17 - 63 U/L   Alkaline Phosphatase 55 38 - 126 U/L   Total Bilirubin 1.0 0.3 - 1.2 mg/dL   GFR calc non Af Amer 4 (L) >60 mL/min  GFR calc Af Amer 4 (L) >60 mL/min    Comment: (NOTE) The eGFR has been calculated using the CKD EPI equation. This calculation has not been validated in all clinical situations. eGFR's persistently <60 mL/min signify possible Chronic Kidney Disease.    Anion gap 10 5 - 15  CBC with Differential     Status: Abnormal   Collection Time: 03-02-17 12:25 AM  Result Value Ref Range   WBC 4.7 4.0 - 10.5 K/uL   RBC 4.00 (L) 4.22 - 5.81 MIL/uL   Hemoglobin 9.8 (L) 13.0 - 17.0 g/dL   HCT 31.1 (L) 39.0 - 52.0 %   MCV 77.8 (L) 78.0 - 100.0 fL   MCH 24.5 (L) 26.0 - 34.0 pg   MCHC 31.5 30.0 - 36.0 g/dL   RDW 17.0 (H) 11.5 - 15.5 %   Platelets 83 (L) 150 - 400 K/uL    Comment: REPEATED TO VERIFY SPECIMEN CHECKED FOR CLOTS PLATELET COUNT  CONFIRMED BY SMEAR    Neutrophils Relative % 59 %   Neutro Abs 2.8 1.7 - 7.7 K/uL   Lymphocytes Relative 28 %   Lymphs Abs 1.3 0.7 - 4.0 K/uL   Monocytes Relative 12 %   Monocytes Absolute 0.6 0.1 - 1.0 K/uL   Eosinophils Relative 1 %   Eosinophils Absolute 0.1 0.0 - 0.7 K/uL   Basophils Relative 0 %   Basophils Absolute 0.0 0.0 - 0.1 K/uL  Protime-INR     Status: Abnormal   Collection Time: 03/02/17 12:25 AM  Result Value Ref Range   Prothrombin Time 27.7 (H) 11.4 - 15.2 seconds   INR 2.61   Ammonia     Status: None   Collection Time: 03-02-2017 12:25 AM  Result Value Ref Range   Ammonia 23 9 - 35 umol/L  Brain natriuretic peptide     Status: Abnormal   Collection Time: Mar 02, 2017 12:25 AM  Result Value Ref Range   B Natriuretic Peptide >4,500.0 (H) 0.0 - 100.0 pg/mL  I-Stat CG4 Lactic Acid, ED     Status: None   Collection Time: Mar 02, 2017 12:31 AM  Result Value Ref Range   Lactic Acid, Venous 0.93 0.5 - 1.9 mmol/L  I-stat chem 8, ed     Status: Abnormal   Collection Time: Mar 02, 2017 12:32 AM  Result Value Ref Range   Sodium 149 (H) 135 - 145 mmol/L   Potassium 6.6 (HH) 3.5 - 5.1 mmol/L   Chloride 125 (H) 101 - 111 mmol/L   BUN 124 (H) 6 - 20 mg/dL   Creatinine, Ser 12.50 (H) 0.61 - 1.24 mg/dL   Glucose, Bld 65 65 - 99 mg/dL   Calcium, Ion 1.03 (L) 1.15 - 1.40 mmol/L   TCO2 12 (L) 22 - 32 mmol/L   Hemoglobin 11.2 (L) 13.0 - 17.0 g/dL   HCT 33.0 (L) 39.0 - 52.0 %   Comment NOTIFIED PHYSICIAN   I-stat troponin, ED     Status: Abnormal   Collection Time: 03/02/17 12:46 AM  Result Value Ref Range   Troponin i, poc 0.14 (HH) 0.00 - 0.08 ng/mL   Comment NOTIFIED PHYSICIAN    Comment 3            Comment: Due to the release kinetics of cTnI, a negative result within the first hours of the onset of symptoms does not rule out myocardial infarction with certainty. If myocardial infarction is still suspected, repeat the test at appropriate intervals.   POC CBG, ED  Status:  None   Collection Time: Mar 21, 2017  1:35 AM  Result Value Ref Range   Glucose-Capillary 68 65 - 99 mg/dL   Comment 1 Notify RN    Comment 2 Document in Chart   Urinalysis, Routine w reflex microscopic     Status: None   Collection Time: 03/21/2017  2:13 AM  Result Value Ref Range   Color, Urine YELLOW YELLOW   APPearance CLEAR CLEAR   Specific Gravity, Urine 1.013 1.005 - 1.030   pH 5.0 5.0 - 8.0   Glucose, UA NEGATIVE NEGATIVE mg/dL   Hgb urine dipstick NEGATIVE NEGATIVE   Bilirubin Urine NEGATIVE NEGATIVE   Ketones, ur NEGATIVE NEGATIVE mg/dL   Protein, ur NEGATIVE NEGATIVE mg/dL   Nitrite NEGATIVE NEGATIVE   Leukocytes, UA NEGATIVE NEGATIVE  I-Stat CG4 Lactic Acid, ED     Status: None   Collection Time: Mar 21, 2017  2:26 AM  Result Value Ref Range   Lactic Acid, Venous 0.61 0.5 - 1.9 mmol/L   Ct Head Wo Contrast  Result Date: 03/21/17 CLINICAL DATA:  Altered level of consciousness EXAM: CT HEAD WITHOUT CONTRAST TECHNIQUE: Contiguous axial images were obtained from the base of the skull through the vertex without intravenous contrast. COMPARISON:  MRI 03/11/2006 FINDINGS: Brain: No acute territorial infarction, hemorrhage or intracranial mass. Moderate atrophy. Moderate small vessel ischemic changes of the white matter. Old lacunar infarcts in the thalamus and bilateral basal ganglia. Prominent ventricles felt secondary to atrophy. Vascular: No hyperdense vessels. Vertebral artery and carotid artery calcification Skull: Normal. Negative for fracture or focal lesion. Sinuses/Orbits: Mucosal thickening in the sphenoid and ethmoid sinuses. Mucous retention cyst in the right maxillary sinus. Old right medial wall orbital fracture. Other: None IMPRESSION: No CT evidence for acute intracranial abnormality. Atrophy and small vessel ischemic changes of the white matter Electronically Signed   By: Donavan Foil M.D.   On: 2017-03-21 01:02   Dg Chest Port 1 View  Result Date: 03-21-17 CLINICAL  DATA:  Sepsis. EXAM: PORTABLE CHEST 1 VIEW COMPARISON:  Radiographs 01/19/2017 FINDINGS: Cardiomegaly is similar to prior. Vascular stents in the region of the right and on the mid/subclavian. Layering right pleural effusion with hazy opacity in the right lung. Improved left pleural effusion from prior exam. Vascular congestion without overt edema. No pneumothorax. IMPRESSION: Bilateral pleural effusions, layering on the right. Left pleural effusion has improved from prior exam. Cardiomegaly is stable. Electronically Signed   By: Jeb Levering M.D.   On: 03/21/17 00:41    PMH:   Past Medical History:  Diagnosis Date  . Arthritis    "fingers" (02/07/2015)  . Atrial flutter ([place]) 07/26/2014   a. May 2016, CHA2DS2VASc = 4 -> Eliquis, spontaneous conversion to NSR;  b. On amio;  c. 01/2015 EPS: Unable to induce right sided Aflutter. Non-sustained Afib and Left sided Aflutter noted.  . Benign prostatic hyperplasia with elevated prostate specific antigen (PSA) 10/31/2016   PSA (08/2008): 8.38, PSA (05/2009): 6.63, PSA (11/2009): 13.50, PSA (12/2009): 13.70, PSA (12/2010) 11.99, PSA (06/2011): 11.88, PSA (12/2011): 13.93, PSA (12/2014): 13.49 TRUS/BX (10/2008) Prostate 54 cc, Pathology BPH with single core of chronic inflammation  . Benign prostatic hypertrophy with nocturia 07/07/2006  . Cardiomyopathy ([place])    a. 07/2014 Echo: EF 50%; b. 03/2015 Ech: EF 20-25%; c. 07/2015 Echo: EF 30-35%, Gr1 DD, sev LVH.  [name] Chronic venous insufficiency 04/12/2010  . Closed displaced fracture of left femoral neck ([place]) 06/14/2014   s/p left hip hemiarthroplasty June 14, 2014   .  Constipation 05/25/2009   Intermittent   . Coronary artery disease 04/02/2006   a. s/p RCA DES (2.5 x 24 mm TAXUS drug-eluting stent) 2006;  b. low risk myoview in 2011; c. 5/17 MV: EF 28%, large inf apical and inflat infarct w/o ischemia.  . Degenerative joint disease involving multiple joints 02/02/2007  . End stage renal disease on dialysis [facility])     a. TTS Dialysis - currently through right chest diatek, pending AVF.  [name] GERD (gastroesophageal reflux disease)   . Hepatitis    Hep C  . History of blood transfusion 05/2014   "related to hip OR"  . Hx of AKA (above knee amputation), right ([place]) 06/12/2016  . Hyperlipidemia 04/02/2006  . Hypertension   . Hypertensive heart disease 12/09/2006  . Hypothyroidism   . Internal and external hemorrhoids without complication 3/41/9622  . Microcytic normochromic anemia 05/27/2006  . Obesity (BMI 30.0-34.9) 01/15/2012  . Phantom limb pain ([place]) 06/13/2016  . 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 Type 2 diabetes mellitus with neurological manifestations ([place]) 04/02/2006   Neuropathy of the left foot   . Type 2 diabetes mellitus with ophthalmic manifestations ([place]) 04/02/2006   s/p laser surgery for severe diabetic  bilateral non-proliferative retinopathy (2013)    . Type 2 diabetes mellitus with peripheral artery disease ([place]) 05/27/2006   Absent pulses in the left foot   . Type 2 diabetes mellitus with stage 5 chronic kidney disease ([place]) 08/06/2012    PSH:   Past Surgical History:  Procedure Laterality Date  . A/V FISTULAGRAM N/A 11/05/2016   Procedure: A/V Fistulagram Right Arm;  Surgeon: Waynetta Sandy, MD;  Location: [place] [facility];  Service: Cardiovascular;  Laterality: N/A;  . AV FISTULA PLACEMENT Left 10/08/2015   Procedure:  BRACHIOCEPHALIC ARTERIOVENOUS (AV) FISTULA CREATION Right Arm;  Surgeon: Conrad Ethelsville, MD;  Location: [place];  Service: Vascular;  Laterality: Left;  . CARDIAC CATHETERIZATION N/A 09/21/2015   Procedure: Left Heart Cath and Coronary Angiography;  Surgeon: Leonie Man, MD;  Location: [place] [facility];  Service: Cardiovascular;  Laterality: N/A;  . CHOLECYSTECTOMY N/A 12/12/2014   Procedure: LAPAROSCOPIC CHOLECYSTECTOMY WITH  INTRAOPERATIVE CHOLANGIOGRAM;  Surgeon: Greer Pickerel, MD;  Location: [place];  Service: General;  Laterality: N/A;  . CORONARY ANGIOPLASTY WITH STENT PLACEMENT    . ELECTROPHYSIOLOGIC STUDY N/A 02/07/2015   Procedure: A-Flutter Ablation;  Surgeon: Evans Lance, MD;  Location: [place] [facility];  Service: Cardiovascular;  Laterality: N/A;  . FRACTURE SURGERY    . INSERTION OF DIALYSIS CATHETER Right 06/13/2015   Procedure: INSERTION OF DIALYSIS CATHETER RIGHT INTERNAL JUGULAR;  Surgeon: Rosetta Posner, MD;  Location: [place];  Service: Vascular;  Laterality: Right;  . JOINT REPLACEMENT    . LEG AMPUTATION ABOVE KNEE Right ~ 2008  . PERIPHERAL VASCULAR INTERVENTION Right 11/05/2016   Procedure: PERIPHERAL VASCULAR INTERVENTION;  Surgeon: Waynetta Sandy, MD;  Location: [place] [facility];  Service: Cardiovascular;  Laterality: Right;  INNOMINANTE VEIN  . PILONIDAL CYST EXCISION  1990's  . PROSTATE BIOPSY  ~ 2013  . REFRACTIVE SURGERY Bilateral   . REMOVAL OF A DIALYSIS CATHETER Right 06/13/2015   Procedure: REMOVAL OF A DIALYSIS CATHETER;  Surgeon: Rosetta Posner, MD;  Location: [place];  Service: Vascular;  Laterality: Right;  . SVC VENOGRAPHY N/A 11/05/2016   Procedure: SVC Venography;  Surgeon: Waynetta Sandy, MD;  Location: [place] [facility];  Service: Cardiovascular;  Laterality: N/A;  . TONSILLECTOMY    . TOTAL HIP ARTHROPLASTY Left 06/14/2014   Procedure: HEMI HIP ARTHROPLASTY ANTERIOR APPROACH;  Surgeon: Leandrew Koyanagi, MD;  Location: [place];  Service: Orthopedics;  Laterality: Left;    Allergies:  Allergies  Allergen Reactions  . Lisinopril Other (See Comments)    Acute kidney injury  . Amoxicillin Swelling and Other (See Comments)    Puffy eyes & abdominal pain Has patient had a PCN reaction causing immediate rash, facial/tongue/throat swelling, SOB or lightheadedness with hypotension: Yes Has patient had a PCN reaction causing severe rash involving mucus membranes or  skin necrosis: Unknown Has patient had a PCN reaction that required hospitalization: Unknown Has patient had a PCN reaction occurring within the last 10 years: No    . Pravastatin Other (See Comments)    Weakness and fatigue  . Tamsulosin Other (See Comments)    dry throat, sweating, blurred vision  . Doxycycline Rash and Other (See Comments)    Questionable drug rxn rash  . Tape Rash    Medical tape     Medications:   Prior to Admission medications   Medication Sig Start Date End Date Taking? Authorizing Provider  acetaminophen (TYLENOL) 500 MG tablet Take 500-1,000 mg by mouth 2 (two) times daily as needed for moderate pain.   Yes [provider]  amiodarone (PACERONE) 200 MG tablet Take 1 tablet (200 mg total) daily by mouth. 01/26/17  Yes Oval Linsey, MD  atorvastatin (LIPITOR) 40 MG tablet Take 1 tablet (40 mg total) by mouth every evening. 12/12/16 03/12/17 Yes Oval Linsey, MD  calcium citrate-vitamin D (CITRACAL+D) 315-200 MG-UNIT per tablet Take 1 tablet by mouth daily.    Yes [provider]  carvedilol (COREG) 6.25 MG tablet Take 1 tablet (6.25 mg total) by mouth 2 (two) times daily. Patient taking differently: Take 6.25 mg by mouth See admin instructions. 6.25 mg TWO times a day on Sun/Mon/Wed/Fri and 6.25 ONCE a day in the evening on Tues/Thurs/Sat 06/30/16  Yes Maryellen Pile, MD  famotidine (PEPCID) 20 MG tablet TAKE 1 TABLET BY MOUTH BEFORE BREAKFAST AND 1 TABLET BEFORE SUPPER Patient taking differently: Take 20 mg by mouth two times a day before meals (BREAKFAST and SUPPER) 12/31/16  Yes Crenshaw, Denice Bors, MD  fish oil-omega-3 fatty acids 1000 MG capsule Take 1,000 mg by mouth daily.  01/15/12  Yes Oval Linsey, MD  fluticasone (FLONASE) 50 MCG/ACT nasal spray Place 1 spray into both nostrils daily as needed for allergies. 12/12/16  Yes Oval Linsey, MD  gabapentin (NEURONTIN) 300 MG capsule Take 1 capsule (300 mg total) by mouth daily as needed  (for stump pain). 06/13/16  Yes Oval Linsey, MD  hydroxypropyl methylcellulose (ISOPTO TEARS) 2.5 % ophthalmic solution Place 2 drops into both eyes 3 (three) times daily as needed for dry eyes. Reported on 04/20/2015   Yes [provider]  levothyroxine (SYNTHROID, LEVOTHROID) 25 MCG tablet Take 1 tablet (25 mcg total) by mouth daily before breakfast. 09/17/16  Yes Oval Linsey, MD  Multiple Vitamins-Minerals (MULTIVITAMIN WITH MINERALS) tablet Take 1 tablet by mouth daily.   Yes [provider]  ondansetron (ZOFRAN) 4 MG tablet Take 1 tablet (4 mg total) by mouth every 8 (eight) hours as needed for nausea. 03/03/16  Yes Oval Linsey, MD  warfarin (COUMADIN) 2.5 MG tablet Take 2.5-3.75 mg by mouth See admin instructions. Take 1 and  1/2 tablets on Sunday, Tuesday and Thursday then take 1 tablet all the other days 12/31/16  Yes [provider]    Discontinued Meds:   Medications Discontinued During This Encounter  Medication Reason  . aztreonam (AZACTAM) 2 g in dextrose 5 % 50 mL IVPB   . vancomycin (VANCOCIN) IVPB 1000 mg/200 mL premix   . sodium chloride 0.9 % bolus 1,000 mL   . sodium chloride 0.9 % bolus 250 mL     Social History:  reports that he quit smoking about 48 years ago. His smoking use included cigarettes. He has a 10.00 pack-year smoking history. he has never used smokeless tobacco. He reports that he does not drink alcohol or use drugs.  Family History:   Family History  Problem Relation Age of Onset  . Hypertension Mother   . Heart attack Father   . Breast cancer Sister   . Arthritis Sister   . Heart attack Brother   . Diabetes Brother   . Stroke Brother   . Pneumonia Daughter   . Diabetes Brother   . Alcoholism Brother   . Diabetes Brother   . Arthritis Sister        Bilateral knee replacement  . HIV Daughter   . Hypertension Daughter   . Drug abuse Daughter   . Schizophrenia Daughter   . Hypertension Daughter   . Drug abuse  Daughter   . Bipolar disorder Daughter     Blood pressure 134/74, pulse (!) 111, temperature (!) 101.1 F (38.4 C), temperature source Rectal, resp. rate 16, weight 79.4 kg (175 lb), SpO2 98 %. General appearance: appears stated age and mild distress Head: Normocephalic, without obvious abnormality, atraumatic Eyes: Pinpoint Neck: no adenopathy, no carotid bruit, supple, symmetrical, trachea midline and thyroid not enlarged, symmetric, no tenderness/mass/nodules Back: symmetric, no curvature. ROM normal. No CVA tenderness. Resp: clear to auscultation bilaterally Chest wall: no tenderness Cardio: bradycardic at times but regular GI: soft, non-tender; bowel sounds normal; no masses,  no organomegaly Extremities: edema trace Pulses: 2+ and symmetric Skin: Skin color, texture, turgor normal. No rashes or lesions Lymph nodes: Cervical, supraclavicular, and axillary nodes normal. Neurologic: Mental status: alertness: obtunded       Dwana Melena, MD March 10, 2017, 4:35 AM

## 2017-02-21 NOTE — ED Provider Notes (Signed)
Patient seen/examined in the Emergency Department in conjunction with Midlevel Provider  Patient presents with altered mental status fever and dehydration Patient but has not had any dialysis recently  Exam : awake but confused, appears dehydrated, ill appearing Plan: sepsis workup initiated hyperKalemia noted, EKG reviewed, will need meds and nephrology consultation    Zadie RhineWickline, [name] Mahler, MD 02/05/2017 0041

## 2017-02-21 NOTE — ED Notes (Signed)
HR increased between 135-145s.  Gretta BeganHannah EDPA and Bebe ShaggyWickline EDP made aware.  EKG re-checked.

## 2017-02-21 DEATH — deceased

## 2017-02-23 MED FILL — Medication: Qty: 1 | Status: AC

## 2017-02-24 LAB — CULTURE, BLOOD (ROUTINE X 2)
CULTURE: NO GROWTH
SPECIAL REQUESTS: ADEQUATE

## 2017-03-04 ENCOUNTER — Telehealth: Payer: Self-pay

## 2017-03-04 NOTE — Telephone Encounter (Signed)
On 03/04/17 [facility] The d/c is for burial. The patient is a patient of Doctor Molli KnockYacoub. [facility] for [facility].  On 03/05/17 I received the d/c back from Doctor Molli KnockYacoub. I got the d/c ready and called the funeral home to let them know the d/c is ready for pickup.

## 2017-03-09 ENCOUNTER — Ambulatory Visit: Payer: Medicare Other

## 2017-03-13 ENCOUNTER — Ambulatory Visit: Payer: Medicare Other | Admitting: Internal Medicine

## 2017-03-24 NOTE — Discharge Summary (Signed)
NAME:  [name] [name], [name] [name]             ACCOUNT NO.:  [id number]  MEDICAL RECORD NO.:  [id number]  LOCATION:  [facility]                         [facility]  PHYSICIAN:  [name] EvenerWesam Jake Akashdeep Chuba, [name] [name]  DATE OF BIRTH:  1945-06-03  DATE OF ADMISSION:  02/03/2017 DATE OF DISCHARGE:                              DISCHARGE SUMMARY   DEATH SUMMARY  PRIMARY DIAGNOSIS/CAUSE OF DEATH:  Pulseless activity, cardiac arrest.  SECONDARY DIAGNOSES: 1. Respiratory failure. 2. Acute encephalopathy. 3. Pulmonary edema. 4. Pleural effusions. 5. Probable aspiration. 6. Nausea. 7. Vomiting. 8. Bradycardia. 9. Hyperkalemia. 10.Atrial flutter. 11.End-stage renal disease, on hemodialysis with uremia. 12.Hypocalcemia. 13.Hypernatremia. 14.Anemia of chronic disease. 15.Thrombocytopenia. 16.Chronic anticoagulation. 17.Hypoglycemia.  HOSPITAL COURSE:  The patient is a 72 year old male with extensive past medical history, who presented to Pulmonary Critical Care after a cardiac arrest in dialysis.  The patient was resuscitated and brought in to the intensive care unit for further dialysis.  The wife arrived at the hospital, and the Critical Care team as well as the Palliative Care team had an extensive conversation with the family.  After discussion, it was relayed that the patient would not want that level of care, and decision was made to emphasize comfort.  Morphine was started.  The patient was extubated, and he expired shortly thereafter with family at bedside.    [name] EvenerWesam Jake Atilla Zollner, [name] [name]    WJY/MEDQ  D:  02/28/2017  T:  02/28/2017  Job:  [id number]
